# Patient Record
Sex: Female | Born: 1975 | Race: Black or African American | Hispanic: No | Marital: Single | Smoking: Never smoker
Health system: Southern US, Community
[De-identification: ages and names within clinical notes are randomized; demographics above are authoritative.]

## PROBLEM LIST (undated history)

## (undated) DIAGNOSIS — I700 Atherosclerosis of aorta: Secondary | ICD-10-CM

## (undated) DIAGNOSIS — E559 Vitamin D deficiency, unspecified: Secondary | ICD-10-CM

## (undated) DIAGNOSIS — D509 Iron deficiency anemia, unspecified: Secondary | ICD-10-CM

## (undated) DIAGNOSIS — D649 Anemia, unspecified: Secondary | ICD-10-CM

## (undated) DIAGNOSIS — F79 Unspecified intellectual disabilities: Secondary | ICD-10-CM

## (undated) DIAGNOSIS — R112 Nausea with vomiting, unspecified: Secondary | ICD-10-CM

## (undated) DIAGNOSIS — J301 Allergic rhinitis due to pollen: Secondary | ICD-10-CM

## (undated) DIAGNOSIS — K219 Gastro-esophageal reflux disease without esophagitis: Secondary | ICD-10-CM

## (undated) DIAGNOSIS — F250 Schizoaffective disorder, bipolar type: Secondary | ICD-10-CM

## (undated) DIAGNOSIS — R569 Unspecified convulsions: Secondary | ICD-10-CM

## (undated) DIAGNOSIS — R059 Cough, unspecified: Secondary | ICD-10-CM

## (undated) DIAGNOSIS — Z0000 Encounter for general adult medical examination without abnormal findings: Secondary | ICD-10-CM

## (undated) DIAGNOSIS — M545 Low back pain: Secondary | ICD-10-CM

## (undated) DIAGNOSIS — D84821 Immunodeficiency due to drugs: Secondary | ICD-10-CM

## (undated) DIAGNOSIS — Z944 Liver transplant status: Secondary | ICD-10-CM

## (undated) DIAGNOSIS — J4540 Moderate persistent asthma, uncomplicated: Secondary | ICD-10-CM

## (undated) DIAGNOSIS — M1990 Unspecified osteoarthritis, unspecified site: Secondary | ICD-10-CM

## (undated) DIAGNOSIS — G40909 Epilepsy, unspecified, not intractable, without status epilepticus: Secondary | ICD-10-CM

## (undated) DIAGNOSIS — N183 Chronic kidney disease, stage 3 unspecified: Secondary | ICD-10-CM

## (undated) DIAGNOSIS — K5909 Other constipation: Secondary | ICD-10-CM

## (undated) DIAGNOSIS — G4733 Obstructive sleep apnea (adult) (pediatric): Secondary | ICD-10-CM

## (undated) DIAGNOSIS — Z79899 Other long term (current) drug therapy: Secondary | ICD-10-CM

## (undated) DIAGNOSIS — I351 Nonrheumatic aortic (valve) insufficiency: Secondary | ICD-10-CM

## (undated) DIAGNOSIS — I2510 Atherosclerotic heart disease of native coronary artery without angina pectoris: Secondary | ICD-10-CM

## (undated) DIAGNOSIS — D573 Sickle-cell trait: Secondary | ICD-10-CM

## (undated) DIAGNOSIS — M797 Fibromyalgia: Secondary | ICD-10-CM

## (undated) DIAGNOSIS — N939 Abnormal uterine and vaginal bleeding, unspecified: Secondary | ICD-10-CM

## (undated) DIAGNOSIS — Z8711 Personal history of peptic ulcer disease: Secondary | ICD-10-CM

## (undated) DIAGNOSIS — L989 Disorder of the skin and subcutaneous tissue, unspecified: Secondary | ICD-10-CM

## (undated) DIAGNOSIS — N950 Postmenopausal bleeding: Secondary | ICD-10-CM

## (undated) DIAGNOSIS — T7840XA Allergy, unspecified, initial encounter: Secondary | ICD-10-CM

## (undated) DIAGNOSIS — K743 Primary biliary cirrhosis: Secondary | ICD-10-CM

## (undated) DIAGNOSIS — Z9889 Other specified postprocedural states: Secondary | ICD-10-CM

## (undated) DIAGNOSIS — J3089 Other allergic rhinitis: Secondary | ICD-10-CM

## (undated) DIAGNOSIS — N938 Other specified abnormal uterine and vaginal bleeding: Secondary | ICD-10-CM

## (undated) DIAGNOSIS — W19XXXA Unspecified fall, initial encounter: Secondary | ICD-10-CM

## (undated) DIAGNOSIS — K745 Biliary cirrhosis, unspecified: Secondary | ICD-10-CM

## (undated) HISTORY — PX: APPENDECTOMY: SHX54

## (undated) HISTORY — PX: LIVER TRANSPLANT: SHX410

## (undated) HISTORY — PX: EYE MUSCLE SURGERY: SHX370

## (undated) HISTORY — DX: Primary biliary cirrhosis: K74.3

## (undated) HISTORY — DX: Biliary cirrhosis, unspecified: K74.5

## (undated) HISTORY — DX: Liver transplant status: Z94.4

## (undated) HISTORY — PX: TYMPANOSTOMY TUBE PLACEMENT: SHX32

## (undated) HISTORY — DX: Atherosclerotic heart disease of native coronary artery without angina pectoris: I25.10

## (undated) HISTORY — DX: Atherosclerosis of aorta: I70.0

## (undated) HISTORY — DX: Nonrheumatic aortic (valve) insufficiency: I35.1

## (undated) HISTORY — DX: Allergy, unspecified, initial encounter: T78.40XA

## (undated) HISTORY — DX: Anemia, unspecified: D64.9

## (undated) HISTORY — PX: OTHER SURGICAL HISTORY: SHX169

## (undated) HISTORY — PX: ADENOIDECTOMY: SUR15

## (undated) HISTORY — DX: Unspecified convulsions: R56.9

## (undated) HISTORY — DX: Unspecified intellectual disabilities: F79

## (undated) HISTORY — PX: STRABISMUS SURGERY: SHX218

## (undated) HISTORY — PX: COLONOSCOPY W/ BIOPSIES: SHX1374

## (undated) HISTORY — PX: CHOLECYSTECTOMY: SHX55

## (undated) HISTORY — DX: Abnormal uterine and vaginal bleeding, unspecified: N93.9

## (undated) HISTORY — DX: Other specified abnormal uterine and vaginal bleeding: N93.8

---
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
## 1997-11-22 ENCOUNTER — Ambulatory Visit (HOSPITAL_COMMUNITY): Admission: RE | Admit: 1997-11-22 | Discharge: 1997-11-22 | Payer: Self-pay | Admitting: Pediatrics

## 1997-12-13 ENCOUNTER — Encounter: Admission: RE | Admit: 1997-12-13 | Discharge: 1997-12-13 | Payer: Self-pay | Admitting: Family Medicine

## 1998-01-19 ENCOUNTER — Encounter: Admission: RE | Admit: 1998-01-19 | Discharge: 1998-01-19 | Payer: Self-pay | Admitting: Family Medicine

## 1998-02-14 ENCOUNTER — Encounter: Admission: RE | Admit: 1998-02-14 | Discharge: 1998-02-14 | Payer: Self-pay | Admitting: Family Medicine

## 1998-03-15 ENCOUNTER — Encounter: Admission: RE | Admit: 1998-03-15 | Discharge: 1998-03-15 | Payer: Self-pay | Admitting: Sports Medicine

## 1998-03-25 ENCOUNTER — Encounter: Admission: RE | Admit: 1998-03-25 | Discharge: 1998-03-25 | Payer: Self-pay | Admitting: Family Medicine

## 1998-03-28 ENCOUNTER — Encounter: Admission: RE | Admit: 1998-03-28 | Discharge: 1998-03-28 | Payer: Self-pay | Admitting: Family Medicine

## 1998-07-20 ENCOUNTER — Encounter: Admission: RE | Admit: 1998-07-20 | Discharge: 1998-07-20 | Payer: Self-pay | Admitting: Family Medicine

## 1998-08-25 ENCOUNTER — Encounter: Admission: RE | Admit: 1998-08-25 | Discharge: 1998-08-25 | Payer: Self-pay | Admitting: Family Medicine

## 1998-09-20 ENCOUNTER — Encounter: Admission: RE | Admit: 1998-09-20 | Discharge: 1998-09-20 | Payer: Self-pay | Admitting: Sports Medicine

## 1998-10-26 ENCOUNTER — Encounter: Admission: RE | Admit: 1998-10-26 | Discharge: 1998-10-26 | Payer: Self-pay | Admitting: Family Medicine

## 1998-11-28 ENCOUNTER — Encounter: Admission: RE | Admit: 1998-11-28 | Discharge: 1998-11-28 | Payer: Self-pay | Admitting: Family Medicine

## 1999-03-10 ENCOUNTER — Encounter: Admission: RE | Admit: 1999-03-10 | Discharge: 1999-03-10 | Payer: Self-pay | Admitting: Family Medicine

## 1999-06-29 ENCOUNTER — Encounter: Admission: RE | Admit: 1999-06-29 | Discharge: 1999-06-29 | Payer: Self-pay | Admitting: Family Medicine

## 1999-08-04 ENCOUNTER — Encounter: Admission: RE | Admit: 1999-08-04 | Discharge: 1999-08-04 | Payer: Self-pay | Admitting: Family Medicine

## 1999-09-15 ENCOUNTER — Encounter: Admission: RE | Admit: 1999-09-15 | Discharge: 1999-09-15 | Payer: Self-pay | Admitting: Family Medicine

## 1999-09-28 ENCOUNTER — Encounter: Admission: RE | Admit: 1999-09-28 | Discharge: 1999-09-28 | Payer: Self-pay | Admitting: Family Medicine

## 1999-10-31 ENCOUNTER — Encounter: Admission: RE | Admit: 1999-10-31 | Discharge: 1999-10-31 | Payer: Self-pay | Admitting: Family Medicine

## 1999-11-10 ENCOUNTER — Encounter: Admission: RE | Admit: 1999-11-10 | Discharge: 1999-11-10 | Payer: Self-pay | Admitting: Family Medicine

## 1999-12-12 ENCOUNTER — Encounter: Admission: RE | Admit: 1999-12-12 | Discharge: 1999-12-12 | Payer: Self-pay | Admitting: Sports Medicine

## 2000-01-03 ENCOUNTER — Encounter: Admission: RE | Admit: 2000-01-03 | Discharge: 2000-01-03 | Payer: Self-pay | Admitting: Family Medicine

## 2000-05-13 ENCOUNTER — Encounter: Admission: RE | Admit: 2000-05-13 | Discharge: 2000-05-13 | Payer: Self-pay | Admitting: Family Medicine

## 2000-07-04 ENCOUNTER — Encounter: Admission: RE | Admit: 2000-07-04 | Discharge: 2000-07-04 | Payer: Self-pay

## 2000-07-17 ENCOUNTER — Encounter: Admission: RE | Admit: 2000-07-17 | Discharge: 2000-07-17 | Payer: Self-pay | Admitting: Family Medicine

## 2000-07-17 ENCOUNTER — Other Ambulatory Visit: Admission: RE | Admit: 2000-07-17 | Discharge: 2000-07-17 | Payer: Self-pay | Admitting: Family Medicine

## 2000-08-20 ENCOUNTER — Encounter: Admission: RE | Admit: 2000-08-20 | Discharge: 2000-08-20 | Payer: Self-pay | Admitting: Family Medicine

## 2000-09-06 ENCOUNTER — Encounter: Admission: RE | Admit: 2000-09-06 | Discharge: 2000-09-06 | Payer: Self-pay | Admitting: Family Medicine

## 2000-09-12 ENCOUNTER — Encounter: Admission: RE | Admit: 2000-09-12 | Discharge: 2000-09-12 | Payer: Self-pay | Admitting: Family Medicine

## 2000-09-27 ENCOUNTER — Encounter: Admission: RE | Admit: 2000-09-27 | Discharge: 2000-09-27 | Payer: Self-pay | Admitting: Family Medicine

## 2000-10-04 ENCOUNTER — Encounter: Admission: RE | Admit: 2000-10-04 | Discharge: 2000-10-04 | Payer: Self-pay | Admitting: Family Medicine

## 2000-11-11 ENCOUNTER — Encounter: Admission: RE | Admit: 2000-11-11 | Discharge: 2000-11-11 | Payer: Self-pay | Admitting: Family Medicine

## 2000-11-19 ENCOUNTER — Encounter: Admission: RE | Admit: 2000-11-19 | Discharge: 2000-11-19 | Payer: Self-pay | Admitting: Family Medicine

## 2000-11-27 ENCOUNTER — Encounter: Admission: RE | Admit: 2000-11-27 | Discharge: 2000-11-27 | Payer: Self-pay | Admitting: Family Medicine

## 2000-11-27 ENCOUNTER — Ambulatory Visit (HOSPITAL_COMMUNITY): Admission: RE | Admit: 2000-11-27 | Discharge: 2000-11-27 | Payer: Self-pay | Admitting: Family Medicine

## 2000-11-29 ENCOUNTER — Encounter: Admission: RE | Admit: 2000-11-29 | Discharge: 2000-11-29 | Payer: Self-pay | Admitting: Family Medicine

## 2000-12-13 ENCOUNTER — Encounter: Admission: RE | Admit: 2000-12-13 | Discharge: 2000-12-13 | Payer: Self-pay | Admitting: Family Medicine

## 2000-12-30 ENCOUNTER — Encounter: Admission: RE | Admit: 2000-12-30 | Discharge: 2000-12-30 | Payer: Self-pay | Admitting: Sports Medicine

## 2001-01-28 ENCOUNTER — Encounter: Admission: RE | Admit: 2001-01-28 | Discharge: 2001-01-28 | Payer: Self-pay | Admitting: Family Medicine

## 2001-02-26 ENCOUNTER — Encounter: Admission: RE | Admit: 2001-02-26 | Discharge: 2001-02-26 | Payer: Self-pay | Admitting: Family Medicine

## 2001-03-14 ENCOUNTER — Encounter: Admission: RE | Admit: 2001-03-14 | Discharge: 2001-03-14 | Payer: Self-pay | Admitting: Family Medicine

## 2001-04-22 ENCOUNTER — Encounter: Admission: RE | Admit: 2001-04-22 | Discharge: 2001-04-22 | Payer: Self-pay | Admitting: Family Medicine

## 2001-04-28 ENCOUNTER — Encounter: Admission: RE | Admit: 2001-04-28 | Discharge: 2001-04-28 | Payer: Self-pay | Admitting: Family Medicine

## 2001-05-14 ENCOUNTER — Encounter: Admission: RE | Admit: 2001-05-14 | Discharge: 2001-05-14 | Payer: Self-pay | Admitting: Family Medicine

## 2001-06-09 ENCOUNTER — Encounter: Admission: RE | Admit: 2001-06-09 | Discharge: 2001-06-09 | Payer: Self-pay | Admitting: Family Medicine

## 2001-06-09 ENCOUNTER — Encounter: Payer: Self-pay | Admitting: Family Medicine

## 2001-06-18 ENCOUNTER — Encounter: Admission: RE | Admit: 2001-06-18 | Discharge: 2001-06-18 | Payer: Self-pay | Admitting: Family Medicine

## 2001-07-09 ENCOUNTER — Encounter: Admission: RE | Admit: 2001-07-09 | Discharge: 2001-07-09 | Payer: Self-pay | Admitting: Family Medicine

## 2001-08-06 ENCOUNTER — Encounter: Admission: RE | Admit: 2001-08-06 | Discharge: 2001-08-06 | Payer: Self-pay | Admitting: Family Medicine

## 2001-08-20 ENCOUNTER — Ambulatory Visit (HOSPITAL_COMMUNITY): Admission: RE | Admit: 2001-08-20 | Discharge: 2001-08-20 | Payer: Self-pay | Admitting: Family Medicine

## 2001-09-10 ENCOUNTER — Other Ambulatory Visit: Admission: RE | Admit: 2001-09-10 | Discharge: 2001-09-10 | Payer: Self-pay | Admitting: Family Medicine

## 2001-09-10 ENCOUNTER — Encounter: Admission: RE | Admit: 2001-09-10 | Discharge: 2001-09-10 | Payer: Self-pay | Admitting: Family Medicine

## 2001-09-18 ENCOUNTER — Encounter: Admission: RE | Admit: 2001-09-18 | Discharge: 2001-09-18 | Payer: Self-pay | Admitting: Sports Medicine

## 2001-09-18 ENCOUNTER — Encounter: Payer: Self-pay | Admitting: Sports Medicine

## 2001-09-24 ENCOUNTER — Encounter: Admission: RE | Admit: 2001-09-24 | Discharge: 2001-09-24 | Payer: Self-pay | Admitting: Family Medicine

## 2001-10-31 ENCOUNTER — Encounter: Admission: RE | Admit: 2001-10-31 | Discharge: 2001-10-31 | Payer: Self-pay | Admitting: Family Medicine

## 2001-12-10 ENCOUNTER — Encounter: Admission: RE | Admit: 2001-12-10 | Discharge: 2001-12-10 | Payer: Self-pay | Admitting: Family Medicine

## 2002-01-29 ENCOUNTER — Encounter: Admission: RE | Admit: 2002-01-29 | Discharge: 2002-01-29 | Payer: Self-pay | Admitting: Family Medicine

## 2002-03-12 ENCOUNTER — Encounter: Admission: RE | Admit: 2002-03-12 | Discharge: 2002-03-12 | Payer: Self-pay | Admitting: Family Medicine

## 2002-03-30 ENCOUNTER — Encounter: Admission: RE | Admit: 2002-03-30 | Discharge: 2002-03-30 | Payer: Self-pay | Admitting: Family Medicine

## 2002-04-10 ENCOUNTER — Encounter: Admission: RE | Admit: 2002-04-10 | Discharge: 2002-04-10 | Payer: Self-pay | Admitting: Sports Medicine

## 2002-04-10 ENCOUNTER — Encounter: Payer: Self-pay | Admitting: Sports Medicine

## 2002-04-14 ENCOUNTER — Encounter: Admission: RE | Admit: 2002-04-14 | Discharge: 2002-04-14 | Payer: Self-pay | Admitting: Family Medicine

## 2002-05-15 ENCOUNTER — Encounter: Admission: RE | Admit: 2002-05-15 | Discharge: 2002-05-15 | Payer: Self-pay | Admitting: Family Medicine

## 2002-06-30 ENCOUNTER — Encounter: Admission: RE | Admit: 2002-06-30 | Discharge: 2002-06-30 | Payer: Self-pay | Admitting: Family Medicine

## 2002-08-12 ENCOUNTER — Encounter: Admission: RE | Admit: 2002-08-12 | Discharge: 2002-08-12 | Payer: Self-pay | Admitting: Family Medicine

## 2002-08-31 ENCOUNTER — Encounter: Admission: RE | Admit: 2002-08-31 | Discharge: 2002-08-31 | Payer: Self-pay | Admitting: Family Medicine

## 2002-09-17 ENCOUNTER — Encounter: Admission: RE | Admit: 2002-09-17 | Discharge: 2002-09-17 | Payer: Self-pay | Admitting: Family Medicine

## 2002-09-17 ENCOUNTER — Ambulatory Visit (HOSPITAL_COMMUNITY): Admission: RE | Admit: 2002-09-17 | Discharge: 2002-09-17 | Payer: Self-pay | Admitting: Specialist

## 2002-10-02 ENCOUNTER — Encounter: Admission: RE | Admit: 2002-10-02 | Discharge: 2002-10-02 | Payer: Self-pay | Admitting: Family Medicine

## 2002-10-02 ENCOUNTER — Ambulatory Visit (HOSPITAL_COMMUNITY): Admission: RE | Admit: 2002-10-02 | Discharge: 2002-10-02 | Payer: Self-pay | Admitting: Family Medicine

## 2002-10-28 ENCOUNTER — Encounter: Admission: RE | Admit: 2002-10-28 | Discharge: 2002-10-28 | Payer: Self-pay | Admitting: Family Medicine

## 2002-11-18 ENCOUNTER — Encounter: Admission: RE | Admit: 2002-11-18 | Discharge: 2002-11-18 | Payer: Self-pay | Admitting: Family Medicine

## 2003-01-01 ENCOUNTER — Encounter: Admission: RE | Admit: 2003-01-01 | Discharge: 2003-01-01 | Payer: Self-pay | Admitting: Family Medicine

## 2003-01-26 ENCOUNTER — Encounter: Admission: RE | Admit: 2003-01-26 | Discharge: 2003-01-26 | Payer: Self-pay | Admitting: Family Medicine

## 2003-03-22 ENCOUNTER — Encounter: Admission: RE | Admit: 2003-03-22 | Discharge: 2003-03-22 | Payer: Self-pay | Admitting: Family Medicine

## 2003-05-03 ENCOUNTER — Encounter: Admission: RE | Admit: 2003-05-03 | Discharge: 2003-05-03 | Payer: Self-pay | Admitting: Family Medicine

## 2003-06-07 ENCOUNTER — Encounter (INDEPENDENT_AMBULATORY_CARE_PROVIDER_SITE_OTHER): Payer: Self-pay | Admitting: Specialist

## 2003-06-07 ENCOUNTER — Other Ambulatory Visit: Admission: RE | Admit: 2003-06-07 | Discharge: 2003-06-07 | Payer: Self-pay | Admitting: Family Medicine

## 2003-06-07 ENCOUNTER — Encounter: Admission: RE | Admit: 2003-06-07 | Discharge: 2003-06-07 | Payer: Self-pay | Admitting: Family Medicine

## 2003-06-30 ENCOUNTER — Encounter: Admission: RE | Admit: 2003-06-30 | Discharge: 2003-06-30 | Payer: Self-pay | Admitting: Family Medicine

## 2003-07-08 ENCOUNTER — Encounter: Admission: RE | Admit: 2003-07-08 | Discharge: 2003-07-08 | Payer: Self-pay | Admitting: Sports Medicine

## 2003-09-30 ENCOUNTER — Encounter: Admission: RE | Admit: 2003-09-30 | Discharge: 2003-09-30 | Payer: Self-pay | Admitting: Family Medicine

## 2003-10-20 ENCOUNTER — Encounter: Admission: RE | Admit: 2003-10-20 | Discharge: 2003-10-20 | Payer: Self-pay | Admitting: Family Medicine

## 2003-11-02 ENCOUNTER — Encounter: Admission: RE | Admit: 2003-11-02 | Discharge: 2003-11-02 | Payer: Self-pay | Admitting: Sports Medicine

## 2003-11-22 ENCOUNTER — Encounter: Admission: RE | Admit: 2003-11-22 | Discharge: 2003-11-22 | Payer: Self-pay | Admitting: Sports Medicine

## 2004-01-28 ENCOUNTER — Ambulatory Visit: Payer: Self-pay | Admitting: Family Medicine

## 2004-03-10 ENCOUNTER — Ambulatory Visit: Payer: Self-pay | Admitting: Family Medicine

## 2004-03-31 ENCOUNTER — Ambulatory Visit: Payer: Self-pay | Admitting: Family Medicine

## 2004-05-02 ENCOUNTER — Ambulatory Visit: Payer: Self-pay | Admitting: Family Medicine

## 2004-05-19 ENCOUNTER — Ambulatory Visit: Payer: Self-pay | Admitting: Sports Medicine

## 2004-06-13 ENCOUNTER — Ambulatory Visit: Payer: Self-pay | Admitting: Family Medicine

## 2004-06-20 ENCOUNTER — Ambulatory Visit: Payer: Self-pay | Admitting: Family Medicine

## 2004-07-07 ENCOUNTER — Ambulatory Visit: Payer: Self-pay | Admitting: Sports Medicine

## 2004-07-27 ENCOUNTER — Encounter: Admission: RE | Admit: 2004-07-27 | Discharge: 2004-07-27 | Payer: Self-pay | Admitting: Family Medicine

## 2004-07-27 ENCOUNTER — Ambulatory Visit: Payer: Self-pay | Admitting: Family Medicine

## 2004-08-10 ENCOUNTER — Ambulatory Visit: Payer: Self-pay | Admitting: Family Medicine

## 2004-08-10 ENCOUNTER — Encounter: Admission: RE | Admit: 2004-08-10 | Discharge: 2004-08-10 | Payer: Self-pay | Admitting: Sports Medicine

## 2004-08-17 ENCOUNTER — Encounter: Admission: RE | Admit: 2004-08-17 | Discharge: 2004-09-01 | Payer: Self-pay | Admitting: Family Medicine

## 2004-09-12 ENCOUNTER — Ambulatory Visit: Payer: Self-pay | Admitting: Family Medicine

## 2004-09-20 ENCOUNTER — Ambulatory Visit: Payer: Self-pay | Admitting: Family Medicine

## 2004-10-02 ENCOUNTER — Ambulatory Visit: Payer: Self-pay | Admitting: Sports Medicine

## 2004-10-18 ENCOUNTER — Ambulatory Visit: Payer: Self-pay | Admitting: Family Medicine

## 2004-12-07 ENCOUNTER — Ambulatory Visit: Payer: Self-pay | Admitting: Family Medicine

## 2004-12-19 ENCOUNTER — Ambulatory Visit: Payer: Self-pay | Admitting: Family Medicine

## 2005-01-08 ENCOUNTER — Ambulatory Visit: Payer: Self-pay | Admitting: Family Medicine

## 2005-02-22 ENCOUNTER — Ambulatory Visit: Payer: Self-pay | Admitting: Family Medicine

## 2005-03-07 ENCOUNTER — Ambulatory Visit: Payer: Self-pay | Admitting: Family Medicine

## 2005-04-02 ENCOUNTER — Ambulatory Visit: Payer: Self-pay | Admitting: Family Medicine

## 2005-04-12 ENCOUNTER — Ambulatory Visit: Payer: Self-pay | Admitting: Sports Medicine

## 2005-05-15 ENCOUNTER — Ambulatory Visit: Payer: Self-pay | Admitting: Sports Medicine

## 2005-05-25 ENCOUNTER — Ambulatory Visit: Payer: Self-pay | Admitting: Sports Medicine

## 2005-06-01 ENCOUNTER — Ambulatory Visit: Payer: Self-pay | Admitting: Family Medicine

## 2005-06-04 ENCOUNTER — Encounter: Admission: RE | Admit: 2005-06-04 | Discharge: 2005-06-04 | Payer: Self-pay | Admitting: Sports Medicine

## 2005-06-08 ENCOUNTER — Ambulatory Visit: Payer: Self-pay | Admitting: Sports Medicine

## 2005-06-20 ENCOUNTER — Emergency Department (HOSPITAL_COMMUNITY): Admission: EM | Admit: 2005-06-20 | Discharge: 2005-06-20 | Payer: Self-pay | Admitting: Emergency Medicine

## 2005-06-25 ENCOUNTER — Ambulatory Visit: Payer: Self-pay | Admitting: Sports Medicine

## 2005-06-27 ENCOUNTER — Ambulatory Visit: Payer: Self-pay | Admitting: Family Medicine

## 2005-07-10 ENCOUNTER — Ambulatory Visit: Payer: Self-pay | Admitting: Sports Medicine

## 2005-07-18 ENCOUNTER — Ambulatory Visit: Payer: Self-pay | Admitting: Sports Medicine

## 2005-08-15 ENCOUNTER — Ambulatory Visit: Payer: Self-pay | Admitting: Family Medicine

## 2005-08-17 ENCOUNTER — Ambulatory Visit: Payer: Self-pay | Admitting: Family Medicine

## 2005-08-24 ENCOUNTER — Ambulatory Visit: Payer: Self-pay | Admitting: Family Medicine

## 2005-09-25 ENCOUNTER — Ambulatory Visit: Payer: Self-pay | Admitting: Family Medicine

## 2005-10-05 ENCOUNTER — Ambulatory Visit: Payer: Self-pay | Admitting: Family Medicine

## 2005-10-24 ENCOUNTER — Ambulatory Visit: Payer: Self-pay | Admitting: Family Medicine

## 2005-11-07 ENCOUNTER — Ambulatory Visit: Payer: Self-pay | Admitting: Family Medicine

## 2005-11-14 ENCOUNTER — Ambulatory Visit: Payer: Self-pay | Admitting: Family Medicine

## 2005-11-20 ENCOUNTER — Ambulatory Visit: Payer: Self-pay | Admitting: Family Medicine

## 2005-11-25 ENCOUNTER — Encounter (INDEPENDENT_AMBULATORY_CARE_PROVIDER_SITE_OTHER): Payer: Self-pay | Admitting: *Deleted

## 2005-12-07 ENCOUNTER — Ambulatory Visit: Payer: Self-pay | Admitting: Family Medicine

## 2005-12-13 ENCOUNTER — Ambulatory Visit: Payer: Self-pay | Admitting: Family Medicine

## 2005-12-14 ENCOUNTER — Other Ambulatory Visit: Admission: RE | Admit: 2005-12-14 | Discharge: 2005-12-14 | Payer: Self-pay | Admitting: Family Medicine

## 2005-12-14 ENCOUNTER — Ambulatory Visit: Payer: Self-pay | Admitting: Family Medicine

## 2006-01-14 ENCOUNTER — Ambulatory Visit: Payer: Self-pay | Admitting: Family Medicine

## 2006-02-07 ENCOUNTER — Ambulatory Visit: Payer: Self-pay | Admitting: Family Medicine

## 2006-02-20 ENCOUNTER — Ambulatory Visit: Payer: Self-pay | Admitting: Family Medicine

## 2006-02-22 ENCOUNTER — Ambulatory Visit (HOSPITAL_COMMUNITY): Admission: RE | Admit: 2006-02-22 | Discharge: 2006-02-22 | Payer: Self-pay | Admitting: Family Medicine

## 2006-03-04 ENCOUNTER — Ambulatory Visit: Payer: Self-pay | Admitting: Family Medicine

## 2006-03-18 ENCOUNTER — Ambulatory Visit: Payer: Self-pay | Admitting: Sports Medicine

## 2006-04-22 ENCOUNTER — Ambulatory Visit: Payer: Self-pay | Admitting: Sports Medicine

## 2006-05-15 ENCOUNTER — Ambulatory Visit: Payer: Self-pay | Admitting: Family Medicine

## 2006-05-31 ENCOUNTER — Ambulatory Visit: Payer: Self-pay | Admitting: Family Medicine

## 2006-06-05 ENCOUNTER — Ambulatory Visit: Payer: Self-pay | Admitting: Family Medicine

## 2006-06-11 ENCOUNTER — Ambulatory Visit: Payer: Self-pay | Admitting: Sports Medicine

## 2006-06-12 ENCOUNTER — Encounter: Admission: RE | Admit: 2006-06-12 | Discharge: 2006-06-12 | Payer: Self-pay | Admitting: Sports Medicine

## 2006-07-12 ENCOUNTER — Ambulatory Visit: Payer: Self-pay | Admitting: Family Medicine

## 2006-07-12 ENCOUNTER — Ambulatory Visit (HOSPITAL_COMMUNITY): Admission: RE | Admit: 2006-07-12 | Discharge: 2006-07-12 | Payer: Self-pay | Admitting: Family Medicine

## 2006-07-12 ENCOUNTER — Encounter (INDEPENDENT_AMBULATORY_CARE_PROVIDER_SITE_OTHER): Payer: Self-pay | Admitting: Family Medicine

## 2006-07-12 LAB — CONVERTED CEMR LAB
AST: 54 units/L — ABNORMAL HIGH (ref 0–37)
Albumin: 3.9 g/dL (ref 3.5–5.2)
Alkaline Phosphatase: 490 units/L — ABNORMAL HIGH (ref 39–117)
Calcium: 9.2 mg/dL (ref 8.4–10.5)
Chloride: 101 meq/L (ref 96–112)
Potassium: 4.3 meq/L (ref 3.5–5.3)
Sodium: 138 meq/L (ref 135–145)
Total Protein: 8.6 g/dL — ABNORMAL HIGH (ref 6.0–8.3)

## 2006-07-25 DIAGNOSIS — F79 Unspecified intellectual disabilities: Secondary | ICD-10-CM | POA: Insufficient documentation

## 2006-07-25 DIAGNOSIS — F450 Somatization disorder: Secondary | ICD-10-CM | POA: Insufficient documentation

## 2006-07-25 DIAGNOSIS — M1710 Unilateral primary osteoarthritis, unspecified knee: Secondary | ICD-10-CM

## 2006-07-25 DIAGNOSIS — J45909 Unspecified asthma, uncomplicated: Secondary | ICD-10-CM | POA: Insufficient documentation

## 2006-07-25 DIAGNOSIS — IMO0002 Reserved for concepts with insufficient information to code with codable children: Secondary | ICD-10-CM | POA: Insufficient documentation

## 2006-07-25 HISTORY — DX: Morbid (severe) obesity due to excess calories: E66.01

## 2006-07-26 ENCOUNTER — Encounter (INDEPENDENT_AMBULATORY_CARE_PROVIDER_SITE_OTHER): Payer: Self-pay | Admitting: *Deleted

## 2006-07-30 ENCOUNTER — Ambulatory Visit: Payer: Self-pay | Admitting: Family Medicine

## 2006-07-30 DIAGNOSIS — J45909 Unspecified asthma, uncomplicated: Secondary | ICD-10-CM | POA: Insufficient documentation

## 2006-08-01 ENCOUNTER — Telehealth (INDEPENDENT_AMBULATORY_CARE_PROVIDER_SITE_OTHER): Payer: Self-pay | Admitting: Family Medicine

## 2006-08-13 ENCOUNTER — Encounter (INDEPENDENT_AMBULATORY_CARE_PROVIDER_SITE_OTHER): Payer: Self-pay | Admitting: Family Medicine

## 2006-08-13 ENCOUNTER — Ambulatory Visit: Payer: Self-pay | Admitting: Sports Medicine

## 2006-08-22 ENCOUNTER — Telehealth: Payer: Self-pay | Admitting: *Deleted

## 2006-08-23 ENCOUNTER — Ambulatory Visit: Payer: Self-pay | Admitting: Family Medicine

## 2006-08-23 DIAGNOSIS — R569 Unspecified convulsions: Secondary | ICD-10-CM

## 2006-08-27 ENCOUNTER — Telehealth: Payer: Self-pay | Admitting: *Deleted

## 2006-08-29 ENCOUNTER — Ambulatory Visit: Payer: Self-pay | Admitting: Sports Medicine

## 2006-09-13 ENCOUNTER — Telehealth: Payer: Self-pay | Admitting: *Deleted

## 2006-10-02 ENCOUNTER — Ambulatory Visit: Payer: Self-pay | Admitting: Family Medicine

## 2006-10-29 ENCOUNTER — Ambulatory Visit: Payer: Self-pay | Admitting: *Deleted

## 2006-10-29 ENCOUNTER — Encounter (INDEPENDENT_AMBULATORY_CARE_PROVIDER_SITE_OTHER): Payer: Self-pay | Admitting: Family Medicine

## 2006-10-29 LAB — CONVERTED CEMR LAB
Albumin: 4 g/dL (ref 3.5–5.2)
Alkaline Phosphatase: 683 units/L — ABNORMAL HIGH (ref 39–117)
BUN: 16 mg/dL (ref 6–23)
CO2: 23 meq/L (ref 19–32)
Calcium: 9.3 mg/dL (ref 8.4–10.5)
Chloride: 101 meq/L (ref 96–112)
Glucose, Bld: 91 mg/dL (ref 70–99)
Potassium: 4.6 meq/L (ref 3.5–5.3)
Sodium: 139 meq/L (ref 135–145)
Total Protein: 9.1 g/dL — ABNORMAL HIGH (ref 6.0–8.3)

## 2006-10-30 ENCOUNTER — Encounter (INDEPENDENT_AMBULATORY_CARE_PROVIDER_SITE_OTHER): Payer: Self-pay | Admitting: Family Medicine

## 2006-10-30 DIAGNOSIS — R945 Abnormal results of liver function studies: Secondary | ICD-10-CM | POA: Insufficient documentation

## 2006-10-31 ENCOUNTER — Encounter: Admission: RE | Admit: 2006-10-31 | Discharge: 2006-10-31 | Payer: Self-pay | Admitting: Sports Medicine

## 2006-11-15 ENCOUNTER — Telehealth: Payer: Self-pay | Admitting: *Deleted

## 2006-11-20 ENCOUNTER — Encounter (INDEPENDENT_AMBULATORY_CARE_PROVIDER_SITE_OTHER): Payer: Self-pay | Admitting: Family Medicine

## 2006-11-20 ENCOUNTER — Ambulatory Visit: Payer: Self-pay | Admitting: Family Medicine

## 2006-11-20 LAB — CONVERTED CEMR LAB
ALT: 93 units/L — ABNORMAL HIGH (ref 0–35)
AST: 99 units/L — ABNORMAL HIGH (ref 0–37)
Albumin: 4 g/dL (ref 3.5–5.2)
Bilirubin Urine: POSITIVE
GGT: 3942 units/L — ABNORMAL HIGH (ref 7–51)
Glucose, Urine, Semiquant: NEGATIVE
Hepatitis B Surface Ag: NEGATIVE
Prothrombin Time: 13.1 s (ref 11.6–15.2)
Saturation Ratios: 32 % (ref 20–55)
Specific Gravity, Urine: 1.015
TIBC: 419 ug/dL (ref 250–470)
Total Bilirubin: 0.8 mg/dL (ref 0.3–1.2)
Urobilinogen, UA: 1

## 2006-11-22 ENCOUNTER — Encounter (INDEPENDENT_AMBULATORY_CARE_PROVIDER_SITE_OTHER): Payer: Self-pay | Admitting: Family Medicine

## 2006-11-25 ENCOUNTER — Encounter: Admission: RE | Admit: 2006-11-25 | Discharge: 2006-11-25 | Payer: Self-pay | Admitting: Sports Medicine

## 2006-11-27 ENCOUNTER — Telehealth: Payer: Self-pay | Admitting: *Deleted

## 2006-11-28 ENCOUNTER — Ambulatory Visit: Payer: Self-pay | Admitting: Family Medicine

## 2006-12-02 ENCOUNTER — Telehealth: Payer: Self-pay | Admitting: *Deleted

## 2006-12-03 ENCOUNTER — Ambulatory Visit: Payer: Self-pay | Admitting: Family Medicine

## 2006-12-10 ENCOUNTER — Telehealth: Payer: Self-pay | Admitting: *Deleted

## 2006-12-12 ENCOUNTER — Ambulatory Visit: Payer: Self-pay | Admitting: Family Medicine

## 2006-12-12 ENCOUNTER — Telehealth (INDEPENDENT_AMBULATORY_CARE_PROVIDER_SITE_OTHER): Payer: Self-pay | Admitting: Family Medicine

## 2006-12-12 LAB — CONVERTED CEMR LAB: Glucose, Urine, Semiquant: NEGATIVE

## 2007-01-01 ENCOUNTER — Ambulatory Visit: Payer: Self-pay | Admitting: Family Medicine

## 2007-01-01 ENCOUNTER — Encounter (INDEPENDENT_AMBULATORY_CARE_PROVIDER_SITE_OTHER): Payer: Self-pay | Admitting: Family Medicine

## 2007-01-01 LAB — CONVERTED CEMR LAB
ALT: 127 units/L — ABNORMAL HIGH (ref 0–35)
AST: 111 units/L — ABNORMAL HIGH (ref 0–37)
Albumin: 3.9 g/dL (ref 3.5–5.2)

## 2007-01-02 ENCOUNTER — Encounter (INDEPENDENT_AMBULATORY_CARE_PROVIDER_SITE_OTHER): Payer: Self-pay | Admitting: Family Medicine

## 2007-01-06 ENCOUNTER — Telehealth: Payer: Self-pay | Admitting: *Deleted

## 2007-01-07 ENCOUNTER — Encounter: Admission: RE | Admit: 2007-01-07 | Discharge: 2007-01-07 | Payer: Self-pay | Admitting: Sports Medicine

## 2007-01-20 ENCOUNTER — Telehealth: Payer: Self-pay | Admitting: *Deleted

## 2007-01-24 ENCOUNTER — Ambulatory Visit: Payer: Self-pay | Admitting: Family Medicine

## 2007-01-24 ENCOUNTER — Encounter (INDEPENDENT_AMBULATORY_CARE_PROVIDER_SITE_OTHER): Payer: Self-pay | Admitting: Family Medicine

## 2007-01-24 LAB — CONVERTED CEMR LAB
ALT: 140 units/L — ABNORMAL HIGH (ref 0–35)
CO2: 24 meq/L (ref 19–32)
Chloride: 103 meq/L (ref 96–112)
Platelets: 241 10*3/uL (ref 150–400)
Sodium: 140 meq/L (ref 135–145)
Total Bilirubin: 1 mg/dL (ref 0.3–1.2)
Total Protein: 8.5 g/dL — ABNORMAL HIGH (ref 6.0–8.3)
WBC: 4.3 10*3/uL (ref 4.0–10.5)

## 2007-01-29 ENCOUNTER — Telehealth: Payer: Self-pay | Admitting: *Deleted

## 2007-01-30 ENCOUNTER — Telehealth: Payer: Self-pay | Admitting: *Deleted

## 2007-01-31 ENCOUNTER — Ambulatory Visit: Payer: Self-pay | Admitting: Family Medicine

## 2007-02-03 ENCOUNTER — Encounter: Admission: RE | Admit: 2007-02-03 | Discharge: 2007-02-03 | Payer: Self-pay | Admitting: Sports Medicine

## 2007-02-03 ENCOUNTER — Telehealth (INDEPENDENT_AMBULATORY_CARE_PROVIDER_SITE_OTHER): Payer: Self-pay | Admitting: *Deleted

## 2007-02-03 ENCOUNTER — Encounter (INDEPENDENT_AMBULATORY_CARE_PROVIDER_SITE_OTHER): Payer: Self-pay | Admitting: Family Medicine

## 2007-02-10 ENCOUNTER — Telehealth (INDEPENDENT_AMBULATORY_CARE_PROVIDER_SITE_OTHER): Payer: Self-pay | Admitting: Family Medicine

## 2007-02-12 ENCOUNTER — Ambulatory Visit: Payer: Self-pay | Admitting: Family Medicine

## 2007-02-17 ENCOUNTER — Telehealth: Payer: Self-pay | Admitting: *Deleted

## 2007-02-18 ENCOUNTER — Telehealth: Payer: Self-pay | Admitting: *Deleted

## 2007-02-19 ENCOUNTER — Ambulatory Visit: Payer: Self-pay | Admitting: Family Medicine

## 2007-02-20 ENCOUNTER — Ambulatory Visit: Payer: Self-pay | Admitting: Family Medicine

## 2007-02-25 ENCOUNTER — Telehealth: Payer: Self-pay | Admitting: *Deleted

## 2007-03-19 ENCOUNTER — Telehealth (INDEPENDENT_AMBULATORY_CARE_PROVIDER_SITE_OTHER): Payer: Self-pay | Admitting: Family Medicine

## 2007-03-20 ENCOUNTER — Telehealth (INDEPENDENT_AMBULATORY_CARE_PROVIDER_SITE_OTHER): Payer: Self-pay | Admitting: *Deleted

## 2007-03-23 ENCOUNTER — Telehealth (INDEPENDENT_AMBULATORY_CARE_PROVIDER_SITE_OTHER): Payer: Self-pay | Admitting: Family Medicine

## 2007-03-24 ENCOUNTER — Telehealth (INDEPENDENT_AMBULATORY_CARE_PROVIDER_SITE_OTHER): Payer: Self-pay | Admitting: *Deleted

## 2007-03-26 ENCOUNTER — Ambulatory Visit: Payer: Self-pay | Admitting: Family Medicine

## 2007-03-28 ENCOUNTER — Telehealth: Payer: Self-pay | Admitting: *Deleted

## 2007-04-07 ENCOUNTER — Telehealth: Payer: Self-pay | Admitting: *Deleted

## 2007-04-11 ENCOUNTER — Ambulatory Visit: Payer: Self-pay | Admitting: Family Medicine

## 2007-04-28 ENCOUNTER — Telehealth: Payer: Self-pay | Admitting: *Deleted

## 2007-04-30 ENCOUNTER — Ambulatory Visit: Payer: Self-pay | Admitting: Family Medicine

## 2007-05-07 ENCOUNTER — Ambulatory Visit: Payer: Self-pay | Admitting: Family Medicine

## 2007-05-19 ENCOUNTER — Telehealth: Payer: Self-pay | Admitting: *Deleted

## 2007-05-23 ENCOUNTER — Telehealth: Payer: Self-pay | Admitting: *Deleted

## 2007-06-04 ENCOUNTER — Encounter (INDEPENDENT_AMBULATORY_CARE_PROVIDER_SITE_OTHER): Payer: Self-pay | Admitting: Family Medicine

## 2007-06-04 ENCOUNTER — Ambulatory Visit: Payer: Self-pay | Admitting: Family Medicine

## 2007-06-25 ENCOUNTER — Telehealth (INDEPENDENT_AMBULATORY_CARE_PROVIDER_SITE_OTHER): Payer: Self-pay | Admitting: Family Medicine

## 2007-06-27 ENCOUNTER — Ambulatory Visit: Payer: Self-pay | Admitting: Family Medicine

## 2007-07-08 ENCOUNTER — Encounter: Admission: RE | Admit: 2007-07-08 | Discharge: 2007-08-11 | Payer: Self-pay | Admitting: Family Medicine

## 2007-07-21 ENCOUNTER — Encounter (INDEPENDENT_AMBULATORY_CARE_PROVIDER_SITE_OTHER): Payer: Self-pay | Admitting: *Deleted

## 2007-07-24 ENCOUNTER — Ambulatory Visit: Payer: Self-pay | Admitting: Family Medicine

## 2007-08-08 ENCOUNTER — Telehealth (INDEPENDENT_AMBULATORY_CARE_PROVIDER_SITE_OTHER): Payer: Self-pay | Admitting: Family Medicine

## 2007-08-13 ENCOUNTER — Encounter (INDEPENDENT_AMBULATORY_CARE_PROVIDER_SITE_OTHER): Payer: Self-pay | Admitting: Family Medicine

## 2007-08-13 ENCOUNTER — Ambulatory Visit: Payer: Self-pay | Admitting: Family Medicine

## 2007-08-13 ENCOUNTER — Encounter: Payer: Self-pay | Admitting: *Deleted

## 2007-08-13 ENCOUNTER — Telehealth: Payer: Self-pay | Admitting: *Deleted

## 2007-08-14 ENCOUNTER — Encounter (INDEPENDENT_AMBULATORY_CARE_PROVIDER_SITE_OTHER): Payer: Self-pay | Admitting: Family Medicine

## 2007-08-25 LAB — CONVERTED CEMR LAB
ALT: 72 units/L — ABNORMAL HIGH (ref 0–35)
ANA Titer 1: 1:40 {titer} — ABNORMAL HIGH
AST: 66 units/L — ABNORMAL HIGH (ref 0–37)
Alkaline Phosphatase: 618 units/L — ABNORMAL HIGH (ref 39–117)
Anti Nuclear Antibody(ANA): POSITIVE — AB
Basophils Absolute: 0.1 10*3/uL (ref 0.0–0.1)
Basophils Relative: 2 % — ABNORMAL HIGH (ref 0–1)
CO2: 23 meq/L (ref 19–32)
Eosinophils Absolute: 0.5 10*3/uL (ref 0.0–0.7)
Lymphs Abs: 1.8 10*3/uL (ref 0.7–4.0)
MCV: 86 fL (ref 78.0–100.0)
Neutrophils Relative %: 34 % — ABNORMAL LOW (ref 43–77)
Platelets: 229 10*3/uL (ref 150–400)
RDW: 13.8 % (ref 11.5–15.5)
Rhuematoid fact SerPl-aCnc: 29 intl units/mL — ABNORMAL HIGH (ref 0–20)
Sed Rate: 54 mm/hr — ABNORMAL HIGH (ref 0–22)
Sodium: 137 meq/L (ref 135–145)
Total Bilirubin: 1.4 mg/dL — ABNORMAL HIGH (ref 0.3–1.2)
Total Protein: 8.9 g/dL — ABNORMAL HIGH (ref 6.0–8.3)
Vitamin B-12: 616 pg/mL (ref 211–911)
WBC: 4.4 10*3/uL (ref 4.0–10.5)

## 2007-09-02 ENCOUNTER — Telehealth (INDEPENDENT_AMBULATORY_CARE_PROVIDER_SITE_OTHER): Payer: Self-pay | Admitting: *Deleted

## 2007-09-03 ENCOUNTER — Ambulatory Visit: Payer: Self-pay | Admitting: Family Medicine

## 2007-09-11 ENCOUNTER — Encounter: Admission: RE | Admit: 2007-09-11 | Discharge: 2007-09-11 | Payer: Self-pay | Admitting: Family Medicine

## 2007-09-11 ENCOUNTER — Encounter (INDEPENDENT_AMBULATORY_CARE_PROVIDER_SITE_OTHER): Payer: Self-pay | Admitting: Family Medicine

## 2007-09-11 ENCOUNTER — Ambulatory Visit: Payer: Self-pay | Admitting: Family Medicine

## 2007-09-11 LAB — CONVERTED CEMR LAB: Prothrombin Time: 13.2 s (ref 11.6–15.2)

## 2007-09-22 ENCOUNTER — Encounter (INDEPENDENT_AMBULATORY_CARE_PROVIDER_SITE_OTHER): Payer: Self-pay | Admitting: Family Medicine

## 2007-09-24 ENCOUNTER — Telehealth (INDEPENDENT_AMBULATORY_CARE_PROVIDER_SITE_OTHER): Payer: Self-pay | Admitting: Family Medicine

## 2007-10-01 ENCOUNTER — Telehealth (INDEPENDENT_AMBULATORY_CARE_PROVIDER_SITE_OTHER): Payer: Self-pay | Admitting: Family Medicine

## 2007-10-03 ENCOUNTER — Ambulatory Visit: Payer: Self-pay | Admitting: Family Medicine

## 2007-10-03 ENCOUNTER — Encounter (INDEPENDENT_AMBULATORY_CARE_PROVIDER_SITE_OTHER): Payer: Self-pay | Admitting: Family Medicine

## 2007-10-03 LAB — CONVERTED CEMR LAB
Anticardiolipin IgA: 26 (ref <13)
Anticardiolipin IgG: <7 (ref <11)
Anticardiolipin IgM: 24 (ref <10)
ENA SM Ab Ser-aCnc: 0.2 (ref <1.0)
GGT: 2676 units/L — ABNORMAL HIGH (ref 7–51)
ds DNA Ab: <1 (ref <5)

## 2007-10-06 ENCOUNTER — Telehealth: Payer: Self-pay | Admitting: *Deleted

## 2007-10-16 ENCOUNTER — Ambulatory Visit: Payer: Self-pay | Admitting: Family Medicine

## 2007-10-16 DIAGNOSIS — R011 Cardiac murmur, unspecified: Secondary | ICD-10-CM | POA: Insufficient documentation

## 2007-10-21 ENCOUNTER — Telehealth (INDEPENDENT_AMBULATORY_CARE_PROVIDER_SITE_OTHER): Payer: Self-pay | Admitting: Family Medicine

## 2007-10-29 ENCOUNTER — Telehealth: Payer: Self-pay | Admitting: Family Medicine

## 2007-11-03 ENCOUNTER — Ambulatory Visit: Payer: Self-pay | Admitting: Sports Medicine

## 2007-11-11 ENCOUNTER — Encounter (INDEPENDENT_AMBULATORY_CARE_PROVIDER_SITE_OTHER): Payer: Self-pay | Admitting: Family Medicine

## 2007-11-17 ENCOUNTER — Telehealth (INDEPENDENT_AMBULATORY_CARE_PROVIDER_SITE_OTHER): Payer: Self-pay | Admitting: *Deleted

## 2007-11-18 ENCOUNTER — Telehealth (INDEPENDENT_AMBULATORY_CARE_PROVIDER_SITE_OTHER): Payer: Self-pay | Admitting: *Deleted

## 2007-11-19 ENCOUNTER — Encounter: Payer: Self-pay | Admitting: Family Medicine

## 2007-11-19 LAB — CONVERTED CEMR LAB
AST: 62 units/L
BUN: 6 mg/dL
Basophils Relative: 2 %
Calcium: 8.9 mg/dL
Chloride: 101 meq/L
Creatinine, Ser: 0.4 mg/dL
Eosinophils Relative: 15 %
HCT: 31.2 %
Hemoglobin: 10.4 g/dL
INR: 1.04
MCV: 88.8 fL
RDW: 13.3 %
Total Bilirubin: 1.3 mg/dL

## 2007-11-24 ENCOUNTER — Telehealth: Payer: Self-pay | Admitting: *Deleted

## 2007-12-04 ENCOUNTER — Ambulatory Visit: Payer: Self-pay | Admitting: Family Medicine

## 2007-12-05 ENCOUNTER — Encounter: Payer: Self-pay | Admitting: *Deleted

## 2007-12-08 ENCOUNTER — Encounter: Payer: Self-pay | Admitting: Family Medicine

## 2007-12-15 ENCOUNTER — Telehealth: Payer: Self-pay | Admitting: *Deleted

## 2007-12-17 ENCOUNTER — Ambulatory Visit: Payer: Self-pay | Admitting: Family Medicine

## 2008-01-01 ENCOUNTER — Ambulatory Visit: Payer: Self-pay | Admitting: Family Medicine

## 2008-01-02 ENCOUNTER — Telehealth: Payer: Self-pay | Admitting: *Deleted

## 2008-01-12 ENCOUNTER — Ambulatory Visit: Payer: Self-pay | Admitting: Family Medicine

## 2008-01-19 ENCOUNTER — Telehealth: Payer: Self-pay | Admitting: *Deleted

## 2008-01-21 ENCOUNTER — Ambulatory Visit: Payer: Self-pay | Admitting: Family Medicine

## 2008-01-23 ENCOUNTER — Telehealth: Payer: Self-pay | Admitting: *Deleted

## 2008-01-26 ENCOUNTER — Ambulatory Visit: Payer: Self-pay | Admitting: Sports Medicine

## 2008-01-26 DIAGNOSIS — Q6650 Congenital pes planus, unspecified foot: Secondary | ICD-10-CM | POA: Insufficient documentation

## 2008-02-04 ENCOUNTER — Ambulatory Visit: Payer: Self-pay | Admitting: Family Medicine

## 2008-02-04 ENCOUNTER — Encounter: Payer: Self-pay | Admitting: Family Medicine

## 2008-02-04 LAB — CONVERTED CEMR LAB

## 2008-02-12 ENCOUNTER — Telehealth: Payer: Self-pay | Admitting: Family Medicine

## 2008-02-12 ENCOUNTER — Encounter: Payer: Self-pay | Admitting: Family Medicine

## 2008-02-16 ENCOUNTER — Ambulatory Visit: Payer: Self-pay | Admitting: Family Medicine

## 2008-02-19 LAB — CONVERTED CEMR LAB
ALT: 57 units/L — ABNORMAL HIGH (ref 0–35)
Alkaline Phosphatase: 573 units/L — ABNORMAL HIGH (ref 39–117)
CO2: 22 meq/L (ref 19–32)
LDL Cholesterol: 142 mg/dL — ABNORMAL HIGH (ref 0–99)
Potassium: 4.4 meq/L (ref 3.5–5.3)
Sodium: 136 meq/L (ref 135–145)
Total Bilirubin: 1.5 mg/dL — ABNORMAL HIGH (ref 0.3–1.2)
Total Protein: 8.9 g/dL — ABNORMAL HIGH (ref 6.0–8.3)
VLDL: 19 mg/dL (ref 0–40)

## 2008-03-03 ENCOUNTER — Telehealth (INDEPENDENT_AMBULATORY_CARE_PROVIDER_SITE_OTHER): Payer: Self-pay | Admitting: *Deleted

## 2008-03-05 ENCOUNTER — Ambulatory Visit: Payer: Self-pay | Admitting: Family Medicine

## 2008-03-15 ENCOUNTER — Ambulatory Visit: Payer: Self-pay | Admitting: Family Medicine

## 2008-03-22 ENCOUNTER — Ambulatory Visit: Payer: Self-pay | Admitting: Family Medicine

## 2008-04-07 ENCOUNTER — Telehealth: Payer: Self-pay | Admitting: *Deleted

## 2008-04-09 ENCOUNTER — Ambulatory Visit: Payer: Self-pay | Admitting: Family Medicine

## 2008-04-19 ENCOUNTER — Telehealth: Payer: Self-pay | Admitting: *Deleted

## 2008-04-20 ENCOUNTER — Encounter: Payer: Self-pay | Admitting: *Deleted

## 2008-04-20 ENCOUNTER — Encounter (INDEPENDENT_AMBULATORY_CARE_PROVIDER_SITE_OTHER): Payer: Self-pay | Admitting: Family Medicine

## 2008-04-21 ENCOUNTER — Encounter (INDEPENDENT_AMBULATORY_CARE_PROVIDER_SITE_OTHER): Payer: Self-pay | Admitting: *Deleted

## 2008-04-21 ENCOUNTER — Emergency Department (HOSPITAL_COMMUNITY): Admission: EM | Admit: 2008-04-21 | Discharge: 2008-04-21 | Payer: Self-pay | Admitting: Emergency Medicine

## 2008-04-27 ENCOUNTER — Telehealth: Payer: Self-pay | Admitting: *Deleted

## 2008-04-29 ENCOUNTER — Telehealth: Payer: Self-pay | Admitting: *Deleted

## 2008-04-29 ENCOUNTER — Encounter: Payer: Self-pay | Admitting: Family Medicine

## 2008-05-06 ENCOUNTER — Telehealth: Payer: Self-pay | Admitting: Family Medicine

## 2008-05-10 ENCOUNTER — Ambulatory Visit: Payer: Self-pay | Admitting: Family Medicine

## 2008-05-12 ENCOUNTER — Encounter: Payer: Self-pay | Admitting: Family Medicine

## 2008-05-25 ENCOUNTER — Telehealth: Payer: Self-pay | Admitting: *Deleted

## 2008-05-27 ENCOUNTER — Ambulatory Visit: Payer: Self-pay | Admitting: Family Medicine

## 2008-05-27 LAB — CONVERTED CEMR LAB

## 2008-06-01 ENCOUNTER — Encounter: Payer: Self-pay | Admitting: Family Medicine

## 2008-06-01 ENCOUNTER — Telehealth (INDEPENDENT_AMBULATORY_CARE_PROVIDER_SITE_OTHER): Payer: Self-pay | Admitting: *Deleted

## 2008-06-03 ENCOUNTER — Telehealth: Payer: Self-pay | Admitting: *Deleted

## 2008-06-17 ENCOUNTER — Telehealth (INDEPENDENT_AMBULATORY_CARE_PROVIDER_SITE_OTHER): Payer: Self-pay | Admitting: *Deleted

## 2008-06-21 ENCOUNTER — Telehealth: Payer: Self-pay | Admitting: *Deleted

## 2008-06-21 ENCOUNTER — Ambulatory Visit: Payer: Self-pay | Admitting: Family Medicine

## 2008-06-23 ENCOUNTER — Telehealth: Payer: Self-pay | Admitting: Family Medicine

## 2008-06-24 ENCOUNTER — Ambulatory Visit: Payer: Self-pay | Admitting: Family Medicine

## 2008-07-21 ENCOUNTER — Ambulatory Visit: Payer: Self-pay | Admitting: Family Medicine

## 2008-07-21 DIAGNOSIS — K219 Gastro-esophageal reflux disease without esophagitis: Secondary | ICD-10-CM | POA: Insufficient documentation

## 2008-07-27 ENCOUNTER — Telehealth: Payer: Self-pay | Admitting: Family Medicine

## 2008-07-29 ENCOUNTER — Ambulatory Visit: Payer: Self-pay | Admitting: Family Medicine

## 2008-07-29 ENCOUNTER — Encounter: Payer: Self-pay | Admitting: Family Medicine

## 2008-08-02 ENCOUNTER — Telehealth: Payer: Self-pay | Admitting: Family Medicine

## 2008-08-06 ENCOUNTER — Telehealth: Payer: Self-pay | Admitting: Family Medicine

## 2008-08-09 ENCOUNTER — Ambulatory Visit: Payer: Self-pay | Admitting: Family Medicine

## 2008-08-20 ENCOUNTER — Telehealth: Payer: Self-pay | Admitting: *Deleted

## 2008-09-02 ENCOUNTER — Ambulatory Visit: Payer: Self-pay | Admitting: Family Medicine

## 2008-09-06 ENCOUNTER — Telehealth: Payer: Self-pay | Admitting: Family Medicine

## 2008-09-16 ENCOUNTER — Telehealth: Payer: Self-pay | Admitting: Family Medicine

## 2008-09-20 ENCOUNTER — Ambulatory Visit: Payer: Self-pay | Admitting: Family Medicine

## 2008-09-23 ENCOUNTER — Telehealth: Payer: Self-pay | Admitting: Family Medicine

## 2008-09-27 ENCOUNTER — Ambulatory Visit: Payer: Self-pay | Admitting: Family Medicine

## 2008-09-28 ENCOUNTER — Telehealth: Payer: Self-pay | Admitting: Family Medicine

## 2008-10-06 ENCOUNTER — Encounter: Payer: Self-pay | Admitting: Family Medicine

## 2008-10-07 ENCOUNTER — Telehealth: Payer: Self-pay | Admitting: Family Medicine

## 2008-10-07 ENCOUNTER — Encounter: Payer: Self-pay | Admitting: Family Medicine

## 2008-10-08 ENCOUNTER — Ambulatory Visit: Payer: Self-pay | Admitting: Family Medicine

## 2008-10-18 ENCOUNTER — Encounter: Payer: Self-pay | Admitting: Family Medicine

## 2008-11-02 ENCOUNTER — Telehealth: Payer: Self-pay | Admitting: Family Medicine

## 2008-11-10 ENCOUNTER — Telehealth: Payer: Self-pay | Admitting: Family Medicine

## 2008-11-11 LAB — CONVERTED CEMR LAB
HCT: 31.4 %
MCHC: 33.8 g/dL
MCV: 87 fL
Platelets: 207 10*3/uL
RDW: 14.4 %

## 2008-11-15 ENCOUNTER — Telehealth: Payer: Self-pay | Admitting: Family Medicine

## 2008-11-15 ENCOUNTER — Ambulatory Visit: Payer: Self-pay | Admitting: Family Medicine

## 2008-11-30 ENCOUNTER — Telehealth: Payer: Self-pay | Admitting: Family Medicine

## 2008-12-14 ENCOUNTER — Encounter: Payer: Self-pay | Admitting: Family Medicine

## 2008-12-16 ENCOUNTER — Encounter: Payer: Self-pay | Admitting: Family Medicine

## 2008-12-16 ENCOUNTER — Ambulatory Visit: Payer: Self-pay | Admitting: Family Medicine

## 2008-12-17 ENCOUNTER — Telehealth: Payer: Self-pay | Admitting: Family Medicine

## 2008-12-24 ENCOUNTER — Encounter: Payer: Self-pay | Admitting: Family Medicine

## 2008-12-31 ENCOUNTER — Telehealth: Payer: Self-pay | Admitting: Family Medicine

## 2008-12-31 ENCOUNTER — Ambulatory Visit: Payer: Self-pay | Admitting: Family Medicine

## 2008-12-31 LAB — CONVERTED CEMR LAB
Bilirubin Urine: NEGATIVE
Nitrite: NEGATIVE
Protein, U semiquant: NEGATIVE
Urobilinogen, UA: 0.2
WBC Urine, dipstick: NEGATIVE

## 2009-01-06 ENCOUNTER — Telehealth: Payer: Self-pay | Admitting: Family Medicine

## 2009-01-12 ENCOUNTER — Telehealth (INDEPENDENT_AMBULATORY_CARE_PROVIDER_SITE_OTHER): Payer: Self-pay | Admitting: *Deleted

## 2009-01-18 ENCOUNTER — Telehealth (INDEPENDENT_AMBULATORY_CARE_PROVIDER_SITE_OTHER): Payer: Self-pay | Admitting: *Deleted

## 2009-01-19 ENCOUNTER — Ambulatory Visit: Payer: Self-pay | Admitting: Family Medicine

## 2009-01-21 ENCOUNTER — Encounter: Payer: Self-pay | Admitting: Family Medicine

## 2009-01-26 ENCOUNTER — Telehealth: Payer: Self-pay | Admitting: Family Medicine

## 2009-01-28 ENCOUNTER — Ambulatory Visit: Payer: Self-pay | Admitting: Family Medicine

## 2009-01-28 ENCOUNTER — Encounter: Payer: Self-pay | Admitting: Family Medicine

## 2009-01-28 LAB — CONVERTED CEMR LAB
ALT: 67 units/L — ABNORMAL HIGH (ref 0–35)
AST: 87 units/L — ABNORMAL HIGH (ref 0–37)
Albumin: 3.4 g/dL — ABNORMAL LOW (ref 3.5–5.2)
BUN: 15 mg/dL (ref 6–23)
Blood in Urine, dipstick: NEGATIVE
Calcium: 8.2 mg/dL — ABNORMAL LOW (ref 8.4–10.5)
Chloride: 102 meq/L (ref 96–112)
Glucose, Urine, Semiquant: NEGATIVE
Hemoglobin: 9.9 g/dL — ABNORMAL LOW (ref 12.0–15.0)
Platelets: 255 10*3/uL (ref 150–400)
Potassium: 4.4 meq/L (ref 3.5–5.3)
Protein, U semiquant: NEGATIVE
RDW: 14.8 % (ref 11.5–15.5)
Total Protein: 8.6 g/dL — ABNORMAL HIGH (ref 6.0–8.3)
WBC Urine, dipstick: NEGATIVE
pH: 6.5

## 2009-02-01 ENCOUNTER — Telehealth: Payer: Self-pay | Admitting: *Deleted

## 2009-02-02 ENCOUNTER — Encounter: Payer: Self-pay | Admitting: Family Medicine

## 2009-02-04 ENCOUNTER — Ambulatory Visit: Payer: Self-pay | Admitting: Family Medicine

## 2009-02-07 ENCOUNTER — Ambulatory Visit: Payer: Self-pay

## 2009-02-08 ENCOUNTER — Encounter: Payer: Self-pay | Admitting: Family Medicine

## 2009-02-11 ENCOUNTER — Telehealth: Payer: Self-pay | Admitting: Family Medicine

## 2009-02-14 ENCOUNTER — Encounter: Payer: Self-pay | Admitting: Family Medicine

## 2009-02-18 ENCOUNTER — Telehealth (INDEPENDENT_AMBULATORY_CARE_PROVIDER_SITE_OTHER): Payer: Self-pay | Admitting: *Deleted

## 2009-02-21 ENCOUNTER — Encounter: Payer: Self-pay | Admitting: *Deleted

## 2009-02-21 ENCOUNTER — Telehealth: Payer: Self-pay | Admitting: Family Medicine

## 2009-03-02 ENCOUNTER — Telehealth: Payer: Self-pay | Admitting: Family Medicine

## 2009-03-04 ENCOUNTER — Ambulatory Visit: Payer: Self-pay | Admitting: Family Medicine

## 2009-03-07 ENCOUNTER — Telehealth (INDEPENDENT_AMBULATORY_CARE_PROVIDER_SITE_OTHER): Payer: Self-pay | Admitting: *Deleted

## 2009-03-07 ENCOUNTER — Encounter: Admission: RE | Admit: 2009-03-07 | Discharge: 2009-05-09 | Payer: Self-pay | Admitting: *Deleted

## 2009-03-08 ENCOUNTER — Encounter: Payer: Self-pay | Admitting: Family Medicine

## 2009-03-16 ENCOUNTER — Ambulatory Visit: Payer: Self-pay | Admitting: Family Medicine

## 2009-03-17 ENCOUNTER — Telehealth: Payer: Self-pay | Admitting: Family Medicine

## 2009-03-22 ENCOUNTER — Encounter: Payer: Self-pay | Admitting: Family Medicine

## 2009-03-22 LAB — CONVERTED CEMR LAB
ALT: 66 units/L
AST: 79 units/L
Alkaline Phosphatase: 515 units/L
Bilirubin, Direct: 0.7 mg/dL
Indirect Bilirubin: 0.6 mg/dL

## 2009-03-25 ENCOUNTER — Encounter: Payer: Self-pay | Admitting: Family Medicine

## 2009-03-28 ENCOUNTER — Telehealth: Payer: Self-pay | Admitting: Family Medicine

## 2009-03-29 ENCOUNTER — Encounter: Admission: RE | Admit: 2009-03-29 | Discharge: 2009-03-29 | Payer: Self-pay | Admitting: Gastroenterology

## 2009-03-30 ENCOUNTER — Telehealth: Payer: Self-pay | Admitting: Family Medicine

## 2009-04-05 ENCOUNTER — Telehealth: Payer: Self-pay | Admitting: Family Medicine

## 2009-04-06 ENCOUNTER — Ambulatory Visit: Payer: Self-pay | Admitting: Family Medicine

## 2009-04-06 LAB — CONVERTED CEMR LAB
Glucose, Urine, Semiquant: NEGATIVE
Ketones, urine, test strip: NEGATIVE
Nitrite: NEGATIVE
Urobilinogen, UA: 0.2
WBC Urine, dipstick: NEGATIVE
Whiff Test: NEGATIVE

## 2009-04-11 ENCOUNTER — Ambulatory Visit (HOSPITAL_COMMUNITY): Admission: RE | Admit: 2009-04-11 | Discharge: 2009-04-11 | Payer: Self-pay | Admitting: Gastroenterology

## 2009-04-11 ENCOUNTER — Encounter: Payer: Self-pay | Admitting: Family Medicine

## 2009-05-02 ENCOUNTER — Encounter: Payer: Self-pay | Admitting: Family Medicine

## 2009-05-02 ENCOUNTER — Telehealth: Payer: Self-pay | Admitting: Family Medicine

## 2009-05-04 ENCOUNTER — Encounter: Payer: Self-pay | Admitting: *Deleted

## 2009-05-18 ENCOUNTER — Telehealth: Payer: Self-pay | Admitting: Family Medicine

## 2009-06-01 ENCOUNTER — Ambulatory Visit: Payer: Self-pay | Admitting: Family Medicine

## 2009-06-13 ENCOUNTER — Ambulatory Visit: Payer: Self-pay | Admitting: Family Medicine

## 2009-06-16 ENCOUNTER — Telehealth (INDEPENDENT_AMBULATORY_CARE_PROVIDER_SITE_OTHER): Payer: Self-pay | Admitting: *Deleted

## 2009-06-22 ENCOUNTER — Ambulatory Visit: Payer: Self-pay | Admitting: Family Medicine

## 2009-07-10 ENCOUNTER — Telehealth: Payer: Self-pay | Admitting: Family Medicine

## 2009-07-11 ENCOUNTER — Telehealth: Payer: Self-pay | Admitting: Family Medicine

## 2009-07-14 ENCOUNTER — Telehealth: Payer: Self-pay | Admitting: Family Medicine

## 2009-08-04 ENCOUNTER — Ambulatory Visit: Payer: Self-pay | Admitting: Family Medicine

## 2009-08-17 ENCOUNTER — Encounter: Payer: Self-pay | Admitting: Family Medicine

## 2009-08-18 ENCOUNTER — Telehealth: Payer: Self-pay | Admitting: Family Medicine

## 2009-08-18 ENCOUNTER — Encounter: Payer: Self-pay | Admitting: Family Medicine

## 2009-08-19 ENCOUNTER — Encounter: Payer: Self-pay | Admitting: Family Medicine

## 2009-08-24 ENCOUNTER — Encounter: Payer: Self-pay | Admitting: Family Medicine

## 2009-09-08 ENCOUNTER — Encounter: Payer: Self-pay | Admitting: Family Medicine

## 2009-09-09 ENCOUNTER — Encounter: Payer: Self-pay | Admitting: Family Medicine

## 2009-09-09 LAB — CONVERTED CEMR LAB
AST: 88 units/L
Alkaline Phosphatase: 439 units/L
HCT: 29.5 %
INR: 1.1
Indirect Bilirubin: 0.7 mg/dL
MCV: 89.2 fL
Prothrombin Time: 13.7 s
Total Protein: 7.9 g/dL

## 2009-09-13 ENCOUNTER — Ambulatory Visit: Payer: Self-pay | Admitting: Family Medicine

## 2009-09-20 ENCOUNTER — Encounter: Payer: Self-pay | Admitting: Family Medicine

## 2009-10-04 ENCOUNTER — Telehealth: Payer: Self-pay | Admitting: Family Medicine

## 2009-10-26 ENCOUNTER — Telehealth: Payer: Self-pay | Admitting: Family Medicine

## 2009-11-02 ENCOUNTER — Encounter: Payer: Self-pay | Admitting: Family Medicine

## 2009-11-08 ENCOUNTER — Ambulatory Visit: Payer: Self-pay | Admitting: Family Medicine

## 2009-11-08 LAB — CONVERTED CEMR LAB
Bilirubin Urine: POSITIVE
Glucose, Urine, Semiquant: NEGATIVE
Ketones, urine, test strip: NEGATIVE
Nitrite: NEGATIVE
Protein, U semiquant: NEGATIVE
Specific Gravity, Urine: 1.02

## 2009-11-30 ENCOUNTER — Encounter: Payer: Self-pay | Admitting: Family Medicine

## 2009-12-02 ENCOUNTER — Telehealth: Payer: Self-pay | Admitting: Family Medicine

## 2009-12-09 ENCOUNTER — Telehealth: Payer: Self-pay | Admitting: *Deleted

## 2009-12-14 ENCOUNTER — Ambulatory Visit: Payer: Self-pay | Admitting: Family Medicine

## 2009-12-23 ENCOUNTER — Telehealth: Payer: Self-pay | Admitting: Family Medicine

## 2009-12-26 ENCOUNTER — Telehealth: Payer: Self-pay | Admitting: Family Medicine

## 2009-12-26 ENCOUNTER — Encounter: Payer: Self-pay | Admitting: Family Medicine

## 2009-12-27 ENCOUNTER — Encounter: Payer: Self-pay | Admitting: Family Medicine

## 2009-12-29 ENCOUNTER — Telehealth: Payer: Self-pay | Admitting: *Deleted

## 2010-01-02 ENCOUNTER — Telehealth: Payer: Self-pay | Admitting: Family Medicine

## 2010-01-04 ENCOUNTER — Telehealth: Payer: Self-pay | Admitting: Family Medicine

## 2010-01-06 ENCOUNTER — Telehealth (INDEPENDENT_AMBULATORY_CARE_PROVIDER_SITE_OTHER): Payer: Self-pay | Admitting: *Deleted

## 2010-01-26 ENCOUNTER — Ambulatory Visit: Payer: Self-pay | Admitting: Family Medicine

## 2010-01-26 ENCOUNTER — Encounter: Payer: Self-pay | Admitting: Family Medicine

## 2010-01-26 LAB — CONVERTED CEMR LAB
BUN: 19 mg/dL (ref 6–23)
Bilirubin Urine: POSITIVE
Blood in Urine, dipstick: NEGATIVE
CO2: 24 meq/L (ref 19–32)
Calcium: 8.8 mg/dL (ref 8.4–10.5)
Chloride: 104 meq/L (ref 96–112)
Creatinine, Ser: 0.44 mg/dL (ref 0.40–1.20)
Glucose, Bld: 99 mg/dL (ref 70–99)
Glucose, Urine, Semiquant: NEGATIVE
Ketones, urine, test strip: NEGATIVE
Nitrite: NEGATIVE
Potassium: 4.1 meq/L (ref 3.5–5.3)
Protein, U semiquant: NEGATIVE
Sodium: 140 meq/L (ref 135–145)
Specific Gravity, Urine: 1.025
Urobilinogen, UA: 2
WBC Urine, dipstick: NEGATIVE
Whiff Test: NEGATIVE
pH: 6.5

## 2010-01-27 ENCOUNTER — Encounter: Payer: Self-pay | Admitting: Family Medicine

## 2010-02-05 ENCOUNTER — Telehealth: Payer: Self-pay | Admitting: Internal Medicine

## 2010-02-07 ENCOUNTER — Ambulatory Visit: Payer: Self-pay | Admitting: Family Medicine

## 2010-02-10 ENCOUNTER — Telehealth: Payer: Self-pay | Admitting: Family Medicine

## 2010-02-16 ENCOUNTER — Ambulatory Visit: Payer: Self-pay | Admitting: Family Medicine

## 2010-02-17 ENCOUNTER — Telehealth: Payer: Self-pay | Admitting: *Deleted

## 2010-02-23 ENCOUNTER — Encounter: Payer: Self-pay | Admitting: Family Medicine

## 2010-02-24 ENCOUNTER — Telehealth: Payer: Self-pay | Admitting: Family Medicine

## 2010-02-27 ENCOUNTER — Encounter: Payer: Self-pay | Admitting: Family Medicine

## 2010-02-27 ENCOUNTER — Telehealth: Payer: Self-pay | Admitting: Family Medicine

## 2010-02-28 ENCOUNTER — Encounter: Payer: Self-pay | Admitting: Family Medicine

## 2010-03-03 ENCOUNTER — Ambulatory Visit: Payer: Self-pay | Admitting: Family Medicine

## 2010-03-03 ENCOUNTER — Encounter: Payer: Self-pay | Admitting: Family Medicine

## 2010-03-03 DIAGNOSIS — D649 Anemia, unspecified: Secondary | ICD-10-CM | POA: Insufficient documentation

## 2010-03-03 LAB — CONVERTED CEMR LAB
ALT: 56 units/L — ABNORMAL HIGH (ref 0–35)
AST: 82 units/L — ABNORMAL HIGH (ref 0–37)
Albumin: 3.6 g/dL (ref 3.5–5.2)
Alkaline Phosphatase: 463 units/L — ABNORMAL HIGH (ref 39–117)
BUN: 10 mg/dL (ref 6–23)
Beta hcg, urine, semiquantitative: NEGATIVE
Blood in Urine, dipstick: NEGATIVE
CO2: 24 meq/L (ref 19–32)
Calcium: 8.5 mg/dL (ref 8.4–10.5)
Chloride: 102 meq/L (ref 96–112)
Creatinine, Ser: 0.43 mg/dL (ref 0.40–1.20)
Glucose, Bld: 96 mg/dL (ref 70–99)
Glucose, Urine, Semiquant: NEGATIVE
HCT: 30.5 % — ABNORMAL LOW (ref 36.0–46.0)
Hemoglobin: 10.1 g/dL — ABNORMAL LOW (ref 12.0–15.0)
MCHC: 33.1 g/dL (ref 30.0–36.0)
MCV: 86.2 fL (ref 78.0–100.0)
Nitrite: NEGATIVE
Platelets: 203 10*3/uL (ref 150–400)
Potassium: 4.1 meq/L (ref 3.5–5.3)
RBC: 3.54 M/uL — ABNORMAL LOW (ref 3.87–5.11)
RDW: 14.3 % (ref 11.5–15.5)
Sodium: 137 meq/L (ref 135–145)
Specific Gravity, Urine: 1.02
Total Bilirubin: 2.3 mg/dL — ABNORMAL HIGH (ref 0.3–1.2)
Total Protein: 8.2 g/dL (ref 6.0–8.3)
Urobilinogen, UA: 1
WBC Urine, dipstick: NEGATIVE
WBC: 4.1 10*3/uL (ref 4.0–10.5)
pH: 7

## 2010-03-06 ENCOUNTER — Encounter: Admission: RE | Admit: 2010-03-06 | Discharge: 2010-03-06 | Payer: Self-pay | Admitting: Allergy and Immunology

## 2010-03-09 ENCOUNTER — Telehealth: Payer: Self-pay | Admitting: Family Medicine

## 2010-03-12 ENCOUNTER — Encounter: Payer: Self-pay | Admitting: Family Medicine

## 2010-03-14 ENCOUNTER — Telehealth: Payer: Self-pay | Admitting: Family Medicine

## 2010-03-28 ENCOUNTER — Ambulatory Visit: Payer: Self-pay | Admitting: Family Medicine

## 2010-03-30 ENCOUNTER — Telehealth (INDEPENDENT_AMBULATORY_CARE_PROVIDER_SITE_OTHER): Payer: Self-pay | Admitting: *Deleted

## 2010-04-07 ENCOUNTER — Ambulatory Visit: Payer: Self-pay | Admitting: Family Medicine

## 2010-04-10 ENCOUNTER — Telehealth: Payer: Self-pay | Admitting: *Deleted

## 2010-04-12 ENCOUNTER — Encounter: Payer: Self-pay | Admitting: Family Medicine

## 2010-04-12 ENCOUNTER — Ambulatory Visit: Payer: Self-pay | Admitting: Family Medicine

## 2010-04-12 LAB — CONVERTED CEMR LAB
ALT: 72 units/L — ABNORMAL HIGH (ref 0–35)
AST: 94 units/L — ABNORMAL HIGH (ref 0–37)
Alkaline Phosphatase: 408 units/L — ABNORMAL HIGH (ref 39–117)
CO2: 23 meq/L (ref 19–32)
MCHC: 34.2 g/dL (ref 30.0–36.0)
MCV: 86.5 fL (ref 78.0–100.0)
Platelets: 168 10*3/uL (ref 150–400)
RDW: 14.2 % (ref 11.5–15.5)
Sodium: 137 meq/L (ref 135–145)
Total Bilirubin: 1.9 mg/dL — ABNORMAL HIGH (ref 0.3–1.2)
Total Protein: 8.2 g/dL (ref 6.0–8.3)

## 2010-04-14 ENCOUNTER — Encounter: Payer: Self-pay | Admitting: Family Medicine

## 2010-04-14 ENCOUNTER — Telehealth: Payer: Self-pay | Admitting: *Deleted

## 2010-04-17 ENCOUNTER — Encounter: Admission: RE | Admit: 2010-04-17 | Discharge: 2010-04-17 | Payer: Self-pay | Admitting: Family Medicine

## 2010-04-25 ENCOUNTER — Telehealth: Payer: Self-pay | Admitting: *Deleted

## 2010-04-28 ENCOUNTER — Encounter: Payer: Self-pay | Admitting: Family Medicine

## 2010-04-28 ENCOUNTER — Ambulatory Visit: Payer: Self-pay | Admitting: Family Medicine

## 2010-04-28 LAB — CONVERTED CEMR LAB
BUN: 12 mg/dL (ref 6–23)
CO2: 24 meq/L (ref 19–32)
Calcium: 8.2 mg/dL — ABNORMAL LOW (ref 8.4–10.5)
Carbamazepine Lvl: 4.8 ug/mL (ref 4.0–12.0)
Chloride: 104 meq/L (ref 96–112)
Creatinine, Ser: 0.46 mg/dL (ref 0.40–1.20)
Glucose, Bld: 107 mg/dL — ABNORMAL HIGH (ref 70–99)
Potassium: 3.9 meq/L (ref 3.5–5.3)
Sodium: 139 meq/L (ref 135–145)
Valproic Acid Lvl: 52.1 ug/mL (ref 50.0–100.0)

## 2010-05-08 ENCOUNTER — Telehealth: Payer: Self-pay | Admitting: Family Medicine

## 2010-05-18 ENCOUNTER — Ambulatory Visit: Payer: Self-pay | Admitting: Family Medicine

## 2010-05-25 ENCOUNTER — Telehealth: Payer: Self-pay | Admitting: *Deleted

## 2010-05-28 DIAGNOSIS — K743 Primary biliary cirrhosis: Secondary | ICD-10-CM

## 2010-05-28 HISTORY — DX: Primary biliary cirrhosis: K74.3

## 2010-05-31 DIAGNOSIS — M26629 Arthralgia of temporomandibular joint, unspecified side: Secondary | ICD-10-CM | POA: Insufficient documentation

## 2010-06-05 ENCOUNTER — Telehealth: Payer: Self-pay | Admitting: Family Medicine

## 2010-06-07 ENCOUNTER — Ambulatory Visit: Admission: RE | Admit: 2010-06-07 | Discharge: 2010-06-07 | Payer: Self-pay | Source: Home / Self Care

## 2010-06-07 ENCOUNTER — Encounter: Payer: Self-pay | Admitting: Family Medicine

## 2010-06-07 DIAGNOSIS — N920 Excessive and frequent menstruation with regular cycle: Secondary | ICD-10-CM | POA: Insufficient documentation

## 2010-06-07 LAB — CONVERTED CEMR LAB
HCT: 30.7 % — ABNORMAL LOW (ref 36.0–46.0)
Hemoglobin: 10.2 g/dL — ABNORMAL LOW (ref 12.0–15.0)
MCHC: 33.2 g/dL (ref 30.0–36.0)
MCV: 87 fL (ref 78.0–100.0)
Platelets: 227 10*3/uL (ref 150–400)
RBC: 3.53 M/uL — ABNORMAL LOW (ref 3.87–5.11)
RDW: 14.8 % (ref 11.5–15.5)
WBC: 6.3 10*3/uL (ref 4.0–10.5)

## 2010-06-14 ENCOUNTER — Ambulatory Visit: Admission: RE | Admit: 2010-06-14 | Discharge: 2010-06-14 | Payer: Self-pay | Source: Home / Self Care

## 2010-06-14 DIAGNOSIS — R05 Cough: Secondary | ICD-10-CM | POA: Insufficient documentation

## 2010-06-14 DIAGNOSIS — R319 Hematuria, unspecified: Secondary | ICD-10-CM | POA: Insufficient documentation

## 2010-06-14 DIAGNOSIS — K625 Hemorrhage of anus and rectum: Secondary | ICD-10-CM | POA: Insufficient documentation

## 2010-06-14 DIAGNOSIS — R059 Cough, unspecified: Secondary | ICD-10-CM | POA: Insufficient documentation

## 2010-06-14 LAB — CONVERTED CEMR LAB
Bilirubin Urine: POSITIVE
Blood in Urine, dipstick: NEGATIVE
Glucose, Urine, Semiquant: NEGATIVE
Ketones, urine, test strip: NEGATIVE
Nitrite: NEGATIVE
Protein, U semiquant: NEGATIVE
Specific Gravity, Urine: 1.02
Urobilinogen, UA: 0.2
WBC Urine, dipstick: NEGATIVE
pH: 5.5

## 2010-06-17 ENCOUNTER — Encounter: Payer: Self-pay | Admitting: Family Medicine

## 2010-06-27 NOTE — Assessment & Plan Note (Signed)
Summary: not feeling well,tcb   Vital Signs:  Patient profile:   35 year old female Height:      61 inches Weight:      148.1 pounds BMI:     28.08 Temp:     98.8 degrees F oral Pulse rate:   80 / minute BP sitting:   105 / 73  (left arm) Cuff size:   regular  Vitals Entered By: Garen Grams LPN (March 03, 2010 9:06 AM) CC: allergies, heavy periods Is Patient Diabetic? No Pain Assessment Patient in pain? no        Primary Care Provider:  Helane Rima DO  CC:  allergies and heavy periods.  History of Present Illness: 35 yo F:  1. Allergic Rhinitis: Followed by Allergist. Reviewed medications. Not worse than usual. Made better with medications/allergy shots. Endorses runny nose, congestion, PND, cough. Denies HA, dizziness, CP, SOB, ear pain, wheeze, LE edema, rash, fever/chills. Has upcoming appointment with Allergist.  2. Heavy Menses: Regular menses, lasts 5-10 days, heavy. Not sexually active. Not on meds. Endorses fatigue and PMHx anemia, fe deficiency. Denies vaginal discharge, odor, and itch.  3. Social: Kateri Mc is POA. Updated Social Hx. Offerred monthly visits with patient since she has been calling so often lately with vague general s/s. Patient agreed.  Habits & Providers  Alcohol-Tobacco-Diet     Alcohol drinks/day: 0     Tobacco Status: never     Passive Smoke Exposure: yes  Current Medications (verified): 1)  Depakote 250 Mg Tbec (Divalproex Sodium) .... Take 1 Tablet By Mouth Four Times A Day 2)  Tegretol Xr 400 Mg Tb12 (Carbamazepine) .... Take 1 Tablet By Mouth Twice A Day 3)  Fexofenadine Hcl 180 Mg Tabs (Fexofenadine Hcl) .... Take 1 Tablet By Mouth Once A Day 4)  Flonase 50 Mcg/act  Susp (Fluticasone Propionate) .... 2 Sprays Into Each Nostril Daily For Allergic Rhinitis 5)  Albuterol 90 Mcg/act Aers (Albuterol) .... 2 Puffs Every 4 Hours As Needed 6)  Singulair 10 Mg  Tabs (Montelukast Sodium) .... One Tablet Daily For Rhinitis 7)  Ipratropium  Bromide 0.06 %  Soln (Ipratropium Bromide) .... 2 Sprays Into Each Nostril Every 8-12 Hours As Needed For Runny Nose Due To Allergies 8)  Symbicort 160-4.5 Mcg/act Aero (Budesonide-Formoterol Fumarate) .... 2 Inhalation Bid 9)  Omeprazole 20 Mg Cpdr (Omeprazole) .... One By Mouth Daily 10)  Ibuprofen 600 Mg  Tabs (Ibuprofen) .Marland Kitchen.. 1 Tab By Mouth Q 6 Hours Prn For Pain 11)  Selenium Sulfide 2.5 % Lotn (Selenium Sulfide) .... Use Daily As Directed Disp 1 Bottle 12)  Caltrate 600+d Plus 600-400 Mg-Unit Chew (Calcium Carbonate-Vit D-Min) .Marland Kitchen.. 1 Tablet By Mouth Daily 13)  Nystatin 100000 Unit/gm Crea (Nystatin) .... Apply To Groin Two Times A Day As Needed For Rash 14)  Omeprazole 40 Mg Cpdr (Omeprazole) .... One Tab in Am, One Tab in The Pm X 2 Weeks, Then One Tab Daily  Allergies (verified): 1)  ! Sudafed 2)  ! Sulfa 3)  Tramadol Hcl (Tramadol Hcl)  Past History:  Past Medical History: MENORRHAGIA (ICD-626.2)    - Depo Provera, started 02/2010 ANEMIA (ICD-285.9) MENTAL RETARDATION (ICD-319) ASTHMA, PERSISTENT, MILD (ICD-493.90) GERD (ICD-530.81) SOMATIZATION DISORDER (ICD-300.81) ALLERGIC RHINITIS (ICD-477.9)    - Followed by Allergy Specialist EPILEPSY (ICD-780.39)    - Followed by Neurology LIVER FUNCTION TESTS, ABNORMAL (ICD-794.8)    - Followed by GI, Dr. Matthias Hughs OSTEOARTHRITIS, LOWER LEG (ICD-715.96) OBESITY, NOS (ICD-278.00) CARDIAC FLOW MURMUR (ICD-785.2) PES  PLANUS (ICD-754.61)  Past Surgical History: Abd [place] -  mild diffuse fatty infiltrate - 11/08/2003 Baseline peak flows 440 - 05/03/2007 Echo - nl EF, tr TR - 09/10/2001 Flexible Sigmoidoscopy - WNL 11/97, 4/02  PMH-FH-SH reviewed for relevance  Family History: MGF - SLE, anemia; interstitial lung dz; died at 65yo, mother deceased 2/2 OHS, COPD, hemorrhage 2/2 coumadin tx for DVT/PE. Mom had  DM, HTN, asthma, fibromyalgia, somatization, and morbid  obesity.  Social History: Pt lives at home alone.  Her Uncle lives next  door.  She has a home health aid who visits three hours a day to help with cooking, cleaning.  Adair can also do these things herself.  She used to live with and care for her mother.  Her mother passed away early in 10-15-2006.  POA: Haydee Jabbour [phone]. Demetria is okay with direct communication between PCP and POA.   Review of Systems      See HPI  Physical Exam  General:  Well-developed, well-nourished, in no acute distress; alert, appropriate and cooperative throughout examination. Vitals reviewed. Eyes:  No injection. Ears:  R ear normal and L ear normal.   Nose:  Mild nasal discharge, mucosal pallor.   Mouth:  MMM, no PND. Neck:  No deformities, masses, or tenderness noted. Lungs:  Normal WOB, no wheezing or crackles, good air movement.  Heart:  Normal rate and regular rhythm.  Abdomen:  Bowel sounds positive, abdomen soft and non-tender without masses, organomegaly or hernias noted.   Impression & Recommendations:  Problem # 1:  ALLERGIC RHINITIS (ICD-477.9) Assessment Unchanged Continue current medications per Allergist. No Red Flags. Her updated medication list for this problem includes:    Fexofenadine Hcl 180 Mg Tabs (Fexofenadine hcl) .Marland Kitchen... Take 1 tablet by mouth once a day    Flonase 50 Mcg/act Susp (Fluticasone propionate) .Marland Kitchen... 2 sprays into each nostril daily for allergic rhinitis    Ipratropium Bromide 0.06 % Soln (Ipratropium bromide) .Marland Kitchen... 2 sprays into each nostril every 8-12 hours as needed for runny nose due to allergies  Orders: [facility]- Est  Level 4 (11914)  Problem # 2:  MENORRHAGIA (ICD-626.2) Assessment: New  Depo Provera s/p negative Upreg. Check CBC. Orders: [facility]- Est  Level 4 (78295)  Her updated medication list for this problem includes:    Depo-provera 150 Mg/ml Susp (Medroxyprogesterone acetate) ..... Q 3 months, per protocol  Problem # 3:  ANEMIA (ICD-285.9) Assessment: Unchanged Hx of, check CBC for need for iron  supplementation. Orders: CBC-[facility] (62130) [facility]- Est  Level 4 (86578)  Problem # 4:  LIVER FUNCTION TESTS, ABNORMAL (ICD-794.8) Assessment: Unchanged Hx of, + dark urine when checking UPreg. Will f/u with CMP, UA. Patient states that she had recent appointment with Dr. Matthias Hughs - "I have gallbladder disease that he is watching." Will request records.  Orders: Comp Met-[facility] ([id number]) Urinalysis-[facility] (00000) [facility]- Est  Level 4 (13244)  Problem # 5:  SOMATIZATION DISORDER (ICD-300.81) Assessment: Unchanged Monthly appointments. Will call POA to make sure that she is getting enough support.  Complete Medication List: 1)  Depakote 250 Mg Tbec (Divalproex sodium) .... Take 1 tablet by mouth four times a day 2)  Tegretol Xr 400 Mg Tb12 (Carbamazepine) .... Take 1 tablet by mouth twice a day 3)  Fexofenadine Hcl 180 Mg Tabs (Fexofenadine hcl) .... Take 1 tablet by mouth once a day 4)  Flonase 50 Mcg/act Susp (Fluticasone propionate) .... 2 sprays into each nostril daily for allergic rhinitis 5)  Albuterol  90 Mcg/act Aers (Albuterol) .... 2 puffs every 4 hours as needed 6)  Singulair 10 Mg Tabs (Montelukast sodium) .... One tablet daily for rhinitis 7)  Ipratropium Bromide 0.06 % Soln (Ipratropium bromide) .... 2 sprays into each nostril every 8-12 hours as needed for runny nose due to allergies 8)  Symbicort 160-4.5 Mcg/act Aero (Budesonide-formoterol fumarate) .... 2 inhalation bid 9)  Omeprazole 20 Mg Cpdr (Omeprazole) .... One by mouth daily 10)  Ibuprofen 600 Mg Tabs (Ibuprofen) .Marland Kitchen.. 1 tab by mouth q 6 hours prn for pain 11)  Selenium Sulfide 2.5 % Lotn (Selenium sulfide) .... Use daily as directed disp 1 bottle 12)  Caltrate 600+d Plus 600-400 Mg-unit Chew (Calcium carbonate-vit d-min) .Marland Kitchen.. 1 tablet by mouth daily 13)  Nystatin 100000 Unit/gm Crea (Nystatin) .... Apply to groin two times a day as needed for rash 14)  Omeprazole 40 Mg Cpdr (Omeprazole) .... One tab in am, one tab in the pm x  2 weeks, then one tab daily 15)  Depo-provera 150 Mg/ml Susp (Medroxyprogesterone acetate) .... Q 3 months, per protocol  Other Orders: U Preg-[facility] (04540) Influenza Vaccine NON MCR (98119) Depo-Provera 150mg  (J4782)  Patient Instructions: 1)  It was nice to see you today! 2)  Follow up in one month.  Laboratory Results   Urine Tests  Date/Time Received: March 03, 2010 9:52 AM  Date/Time Reported: March 03, 2010 10:00 AM   Routine Urinalysis   Color: brown/coffee Appearance: Clear Glucose: negative   (Normal Range: Negative) Bilirubin: large;  reflex ictotest = large   (Normal Range: Negative) Ketone: trace (5)   (Normal Range: Negative) Spec. Gravity: 1.020   (Normal Range: 1.003-1.035) Blood: negative   (Normal Range: Negative) pH: 7.0   (Normal Range: 5.0-8.0) Protein: trace   (Normal Range: Negative) Urobilinogen: 1.0   (Normal Range: 0-1) Nitrite: negative   (Normal Range: Negative) Leukocyte Esterace: negative   (Normal Range: Negative)  Urine Microscopic WBC/HPF: rare Bacteria/HPF: 1+ Mucous/HPF: 2+ Epithelial/HPF: occ    Urine HCG: negative Comments: u preg ...........test performed by...........Marland KitchenTerese Door, CMA urine ...............test performed by......Marland KitchenBonnie A. [place], MLS (ASCP)cm        Immunizations Administered:  Influenza Vaccine # 1:    Vaccine Type: Fluvax Non-MCR    Site: left deltoid    Mfr: GlaxoSmithKline    Dose: 0.5 ml    Route: IM    Given by: Jone Baseman CMA    Exp. Date: 11/22/2010    Lot #: [id number]    VIS given: 12/20/09 version given March 03, 2010.  Flu Vaccine Consent Questions:    Do you have a history of severe allergic reactions to this vaccine? no    Any prior history of allergic reactions to egg and/or gelatin? no    Do you have a sensitivity to the preservative Thimersol? no    Do you have a past history of Guillan-Barre Syndrome? no    Do you currently have an acute febrile illness? no    Have  you ever had a severe reaction to latex? no    Vaccine information given and explained to patient? yes    Are you currently pregnant? no    Medication Administration  Injection # 1:    Medication: Depo-Provera 150mg     Diagnosis: CONTRACEPTIVE MANAGEMENT (ICD-V25.09)    Route: IM    Site: R deltoid    Exp Date: 07/26/2012    Lot #: [id number]    Mfr: [email or web address]    Comments:  Next depo due 12.23.11 - 1.6.11    Patient tolerated injection without complications    Given by: Jone Baseman CMA (March 03, 2010 12:10 PM)  Orders Added: 1)  CBC-[facility] [85027] 2)  U Preg-[facility] [81025] 3)  Comp Met-[facility] [[id number]] 4)  Urinalysis-[facility] [00000] 5)  Influenza Vaccine NON MCR [00028] 6)  Depo-Provera 150mg  [J1055] 7)  [facility]- Est  Level 4 [91478]

## 2010-06-27 NOTE — Progress Notes (Signed)
Summary: see message for pt/ts  Phone Note Call from Patient   Caller: Patient Call For: [phone] Summary of Call: Patient having chest discomfort,headaches,runny nose and cough.  Patient says this happens everytime she has her cycle.  Want to know why this is.  Please call her back. Initial call taken by: Abundio Miu,  February 17, 2010 3:39 PM  Follow-up for Phone Call        I will not be available to call the patient until Tuesday. Will send to [facility] for triage - patient has Hx of needing reassurance. She was seen last week and did not have these symptoms. I appreciate Sally's help with this. Follow-up by: Helane Rima DO,  February 19, 2010 4:36 PM  Additional Follow-up for Phone Call Additional follow up Details #1::        LM asking her to call me back Additional Follow-up by: [facility] RN,  February 20, 2010 10:35 AM    Additional Follow-up for Phone Call Additional follow up Details #2::    told her her allergy & asthma symptoms may not be related to her peroids. states she is taking all meds as ordered & is not out of any. says she frequently feels this way when she gets her periods & called her allergist & he/she gives her prednisone. told her as we have no appts left she may want to call the allergist & see if they can see her.  c/o cold symptoms. states she has used [employer] [facility] before. told her she can try it again. thinks she is sick because she was out in the rain without an ummbrella and she did not have a sweater onn & our offices were cold.  told her I will send this to pcp & will call her with md response. told her many women feel bad during their periods. may take tylenol for headache, myalgias  Follow-up by: [facility] RN,  February 20, 2010 3:54 PM  Additional Follow-up for Phone Call Additional follow up Details #3:: Details for Additional Follow-up Action Taken: agree with RN response to patient re: symptomatic treatment of vague  symptoms. if she feels that her symptoms are worsening, she may make an appointment to be seen.  Additional Follow-up by: Helane Rima DO,  February 22, 2010 8:36 AM

## 2010-06-27 NOTE — Consult Note (Signed)
Summary: [facility]   Imported By: Clydell Hakim 09/15/2009 09:39:33  _____________________________________________________________________  External Attachment:    Type:   Image     Comment:   External Document

## 2010-06-27 NOTE — Miscellaneous (Signed)
Summary: PA request  Clinical Lists Changes PA required for Ipratropium nasal spray. Form placed in Dr. Deirdre Peer box. Theresia Lo RN  April 14, 2010 8:44 AM    Will defer to Dr. Earlene Plater as I am not aware of pt history and can not document her previous meds Milinda Antis MD  April 14, 2010 5:03 PM   Appended Document: PA request wants to know if PA has been sent in for this med? [facility]- [address]  Appended Document: PA request Done. If does not go through, she may need to get request through allergist.  Appended Document: PA request Recieved PA form back stating the form that was completed (inhaled corticosteroid form) was incorrect for the ipratropium nasal spray.  Called [phone]- was advised that the standard drug form should be used in the future.  Was able to complete PA on the phone as pt had tried 2 of the preferred meds in the past, and this was on record with PA company.  Called [facility] and advised PA was complete, they verified, and will fill pt's med.

## 2010-06-27 NOTE — Progress Notes (Signed)
Summary: phn msg  Phone Note Call from Patient Call back at Home Phone [phone]   Caller: Patient Summary of Call: wants to know if she has finished w/ FL2 Initial call taken by: De Nurse,  December 23, 2009 2:25 PM  Follow-up for Phone Call        Completed and put in to be faxed pile. Follow-up by: Helane Rima DO,  December 25, 2009 11:11 AM

## 2010-06-27 NOTE — Letter (Signed)
Summary: Generic Letter  [facility]  [address]   [place], [place] [address]   Phone: [phone]  Fax: [phone]    01/27/2010  [name] [address] [place], [place]  [address]  Dear Ms. Bogie,  I am happy to inform you that your recent labs were all normal.  Sincerely,   Helane Rima DO  Appended Document: Generic Letter mailed

## 2010-06-27 NOTE — Miscellaneous (Signed)
Summary: congested  Clinical Lists Changes she states she started prednisone fri & had a breathing treatment by allergy md. was told if worse to call them. no longer on prednisone. breathing ok per pt. just chest congestion. green mucous when she coughs. she is to go get a CT & Xray per Dr. Willa Rough. states eyes are blurry(they were jumpy earlier) & she feels confused off & on. also dizzy & goes into a deep sleep. she is crying. states she also has "light CP" on R at times. no way to get to UC. w have no appts left today. she is not sure if she wants to go to ED. told her to sit & wait until I speak with her pcp. I will call her back.[facility] RN  February 28, 2010 3:06 PM  Called patient. Discussed above issues. Patient reassured. Advised patient to make appointment with me on Friday. Okay to double book. Will call patient's uncle (caregiver) to discuss frequent calls and possible need for more help at home. Helane Rima DO  February 28, 2010 5:52 PM

## 2010-06-27 NOTE — Assessment & Plan Note (Signed)
Summary: stomach pain,diarrhea/tlb   Vital Signs:  Patient profile:   35 year old female Height:      61 inches Weight:      151 pounds BMI:     28.63 Temp:     98.4 degrees F oral Pulse rate:   84 / minute BP sitting:   110 / 73  (left arm) Cuff size:   regular  Vitals Entered By: Jimmy Footman, CMA (April 12, 2010 9:59 AM) CC: stomach pains & diarrhea x2 weeks Is Patient Diabetic? No   Primary Care Provider:  Helane Rima DO  CC:  stomach pains & diarrhea x2 weeks.  History of Present Illness: [name] presents to the clinic for diarrhea for around 10 days associated with some blood in the diarrhea. She took part of a Z-pack around the time the diarrhea started. She also notes abdominal pain that is diffuse in nature. The diarrhea occurs multiple times a day >5x daily. It seems to be worse after eating. She denies any vomiting and is able to keep her food down. She denies any fevers or chills.   Of note she has had a around 30 lb weight loss since 2008.  Habits & Providers  Alcohol-Tobacco-Diet     Tobacco Status: never  Current Problems (verified): 1)  Diarrhea, Bloody  (ICD-787.91) 2)  Abdominal Pain Right Upper Quadrant  (ICD-789.01) 3)  Viral Uri  (ICD-465.9) 4)  Menorrhagia  (ICD-626.2) 5)  Contraceptive Management  (ICD-V25.09) 6)  Anemia  (ICD-285.9) 7)  Intertrigo, Candidal  (ICD-695.89) 8)  Dyspepsia  (ICD-536.8) 9)  Candidiasis of Vulva and Vagina  (ICD-112.1) 10)  Vaginal Discharge  (ICD-623.5) 11)  Dysuria  (ICD-788.1) 12)  Diarrhea  (ICD-787.91) 13)  Mental Retardation  (ICD-319) 14)  Asthma, Persistent  (ICD-493.90) 15)  Gerd  (ICD-530.81) 16)  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 Take 1 Tablet By Mouth Four Times A Day 2)  Tegretol Xr 400 Mg Tb12 (Carbamazepine) .... Take 1 Tablet By Mouth Twice A Day 3)  Fexofenadine Hcl 180 Mg Tabs (Fexofenadine Hcl) .... Take 1 Tablet By Mouth Once A Day 4)  Flonase 50 Mcg/act  Susp (Fluticasone Propionate) .... 2 Sprays Into Each Nostril Daily For Allergic Rhinitis 5)  Albuterol 90 Mcg/act Aers (Albuterol) .... 2 Puffs Every 4 Hours As Needed 6)  Singulair 10 Mg  Tabs (Montelukast Sodium) .... One Tablet Daily For Rhinitis 7)  Ipratropium Bromide 0.06 %  Soln (Ipratropium Bromide) .... 2 Sprays Into Each Nostril Every 8-12 Hours As Needed For Runny Nose Due To Allergies 8)  Symbicort 160-4.5 Mcg/act Aero (Budesonide-Formoterol Fumarate) .... 2 Inhalation Bid 9)  Omeprazole 20 Mg Cpdr (Omeprazole) .... One By Mouth Daily 10)  Ibuprofen 600 Mg  Tabs (Ibuprofen) .Marland Kitchen.. 1 Tab By Mouth Q 6 Hours Prn For Pain 11)  Selenium Sulfide 2.5 % Lotn (Selenium Sulfide) .... Use Daily As Directed Disp 1 Bottle 12)  Caltrate 600+d Plus 600-400 Mg-Unit Chew (Calcium Carbonate-Vit D-Min) .Marland Kitchen.. 1 Tablet By Mouth Daily 13)  Nystatin 100000 Unit/gm Crea (Nystatin) .... Apply To Groin Two Times A Day As Needed For  Rash 14)  Omeprazole 40 Mg Cpdr (Omeprazole) .... One Tab in Am, One Tab in The Pm X 2 Weeks, Then One Tab Daily 15)  Depo-Provera 150 Mg/ml Susp (Medroxyprogesterone Acetate) .... Q 3 Months, Per Protocol 16)  Metronidazole 500 Mg Tabs (Metronidazole) .Marland Kitchen.. 1 By Mouth Three Times A Day X 14 Days. Disp Qs  Allergies (verified): 1)  ! Sudafed 2)  ! Sulfa 3)  Tramadol Hcl (Tramadol Hcl)  Past History:  Past Medical History: Last updated: 03/03/2010 MENORRHAGIA (ICD-626.2)    - Depo Provera, started 02/2010 ANEMIA (ICD-285.9) MENTAL RETARDATION (ICD-319) ASTHMA, PERSISTENT, MILD (ICD-493.90) GERD  (ICD-530.81) SOMATIZATION DISORDER (ICD-300.81) ALLERGIC RHINITIS (ICD-477.9)    - Followed by Allergy Specialist EPILEPSY (ICD-780.39)    - Followed by Neurology LIVER FUNCTION TESTS, ABNORMAL (ICD-794.8)    - Followed by GI, Dr. Matthias Hughs OSTEOARTHRITIS, LOWER LEG (ICD-715.96) OBESITY, NOS (ICD-278.00) CARDIAC FLOW MURMUR (ICD-785.2) PES PLANUS (ICD-754.61)  Past Surgical History: Last updated: 03/03/2010 Abd [place] -  mild diffuse fatty infiltrate - 11/08/2003 Baseline peak flows 440 - 05/03/2007 Echo - nl EF, tr TR - 09/10/2001 Flexible Sigmoidoscopy - WNL 11/97, 4/02   Family History: Last updated: 03/03/2010 MGF - SLE, anemia; interstitial lung dz; died at 92yo, mother deceased 2/2 OHS, COPD, hemorrhage 2/2 coumadin tx for DVT/PE. Mom had  DM, HTN, asthma, fibromyalgia, somatization, and morbid  obesity.  Social History: Last updated: 03/03/2010 Pt lives at home alone.  Her Uncle lives next door.  She has a home health aid who visits three hours a day to help with cooking, cleaning.  [name] can also do these things herself.  She used to live with and care for her mother.  Her mother passed away early in 2006/11/04.  POA: [name] [phone]. [name] is okay with direct communication between PCP and POA.   Review of Systems       The patient complains of weight loss, abdominal pain, and hematochezia.  The patient denies anorexia, fever, chest pain, syncope, dyspnea on exertion, headaches, melena, severe indigestion/heartburn, muscle weakness, difficulty walking, and enlarged lymph nodes.    Physical Exam  General:  VS noted.  Well NAD Mouth:  MMM Lungs:  Normal WOB, no wheezing or crackles, good air movement.  Heart:  Normal rate and regular rhythm.  Abdomen:  NABS, No distender.  Pain in URQ with + Murphey sign. No masses no guarding no rebound. Soft Extremities:  warm and well perfused Cervical Nodes:  No lymphadenopathy noted Axillary Nodes:  No palpable  lymphadenopathy Psych:  converses slowley   Impression & Recommendations:  Problem # 1:  DIARRHEA, BLOODY (ICD-787.91) Assessment New Concern for C-diff with bloody diarrhea following ABX exposure.  Plan to obtian C-diff antigen and stool culture today and start oral flagyl three times a day x2 weeks.  Will reassess pt in 1 week or sooner with a follow up appointment.  Pt was able to repeat back the red flags, medication instructions, and follow up plan.  Orders: Comp Met-[facility] [phone]) CBC-[facility] [phone]) Culture, Stool- [facility] [phone]) Stool C-Diff toxin assay- [facility] ([id number]) [facility]- Est  Level 4 (62831)  Problem # 2:  ABDOMINAL PAIN RIGHT UPPER QUADRANT (ICD-789.01) Assessment: New Think is related to diarrhea however location is more consistant with gall bladder etiology.  Will obtain a CMP now for biliruben and lfts.  Will also get URQ ultrasound to assess for gall stones.  Will follow up with PCP. Orders: Ultrasound (Ultrasound) [facility]- Est  Level 4 (51761)  Complete Medication List: 1)  Depakote 250 Mg Tbec (Divalproex sodium) .... Take 1 tablet by mouth four times a day 2)  Tegretol Xr 400 Mg Tb12 (Carbamazepine) .... Take 1 tablet by mouth twice a day 3)  Fexofenadine Hcl 180 Mg Tabs (Fexofenadine hcl) .... Take 1 tablet by mouth once a day 4)  Flonase 50 Mcg/act Susp (Fluticasone propionate) .... 2 sprays into each nostril daily for allergic rhinitis 5)  Albuterol 90 Mcg/act Aers (Albuterol) .... 2 puffs every 4 hours as needed 6)  Singulair 10 Mg Tabs (Montelukast sodium) .... One tablet daily for rhinitis 7)  Ipratropium Bromide 0.06 % Soln (Ipratropium bromide) .... 2 sprays into each nostril every 8-12 hours as needed for runny nose due to allergies 8)  Symbicort 160-4.5 Mcg/act Aero (Budesonide-formoterol fumarate) .... 2 inhalation bid 9)  Omeprazole 20 Mg Cpdr (Omeprazole) .... One by mouth daily 10)  Ibuprofen 600 Mg Tabs (Ibuprofen) .Marland Kitchen.. 1 tab by mouth q 6  hours prn for pain 11)  Selenium Sulfide 2.5 % Lotn (Selenium sulfide) .... Use daily as directed disp 1 bottle 12)  Caltrate 600+d Plus 600-400 Mg-unit Chew (Calcium carbonate-vit d-min) .Marland Kitchen.. 1 tablet by mouth daily 13)  Nystatin 100000 Unit/gm Crea (Nystatin) .... Apply to groin two times a day as needed for rash 14)  Omeprazole 40 Mg Cpdr (Omeprazole) .... One tab in am, one tab in the pm x 2 weeks, then one tab daily 15)  Depo-provera 150 Mg/ml Susp (Medroxyprogesterone acetate) .... Q 3 months, per protocol 16)  Metronidazole 500 Mg Tabs (Metronidazole) .Marland Kitchen.. 1 by mouth three times a day x 14 days. disp qs  Patient Instructions: 1)  Thank you for seeing me today. 2)  Please schedule an appointment with a doctor in 1 week.  3)  If you have chest pain, difficulty breathing, fevers over 102 that does not get better with tylenol please call us or see a doctor.  4)  Come back sooner if you are not feeling better.  5)  Start taking the metronidazole three times a day for 2 weeks.  Prescriptions: METRONIDAZOLE 500 MG TABS (METRONIDAZOLE) 1 by mouth three times a day x 14 days. Disp qs  #1 qs x 0   Entered and Authorized by:   Clementeen Graham MD   Signed by:   Clementeen Graham MD on 04/12/2010   Method used:   Electronically to        [id number] (retail)       [address]       [place], [place]  [address]       Ph: [phone] or [phone]       Fax: [phone]   RxID:   [id number]    Orders Added: 1)  Comp Met-[facility] [[id number]] 2)  CBC-[facility] [[id number]] 3)  Culture, Stool- [facility] [[id number]] 4)  [facility] [[id number][facility] 5)  Ultrasound [Ultrasound] 6)  [facility]- Est  Level 4 [[id number]]

## 2010-06-27 NOTE — Progress Notes (Signed)
Summary: phone note  Phone Note Call from Patient Call back at Home Phone [phone]   Caller: Patient Summary of Call: Pt wants to know what she can use for mosquito bites Initial call taken by: Loralee Pacas CMA,  December 02, 2009 2:36 PM  Follow-up for Phone Call        LM.  will tell her to try wet tea bag or wet baking soda, lime or lemon juice, ice area or vinegar.  NOT to combine any of these. choose one Follow-up by: Golden Circle RN,  December 02, 2009 2:50 PM  Additional Follow-up for Phone Call Additional follow up Details #1::        she has baking soda & will using that. told her to leave it on until it dries. Additional Follow-up by: Golden Circle RN,  December 02, 2009 3:48 PM

## 2010-06-27 NOTE — Progress Notes (Signed)
Summary: triage  Phone Note Call from Patient Call back at Home Phone [phone]   Caller: Patient Summary of Call: Pt having diarrhea.  Stomach pain.  Head light headed. Initial call taken by: Clydell Hakim,  July 11, 2009 3:03 PM  Follow-up for Phone Call        started last wed. she took a laxative & had multiple watery stools. still having watery stool. has been eating crackers & drinking water.  told her to drink something else beside water.ate eggs & toast last night & felt ok. had an apple this am-advised banana may be better.likes grits. told her that would be fine.  told her again-no laxatives. reiterated use of powdered or tablet fiber to keep stool at normal consistancy & regular movements asked that she call in am & let us know how she was feeling. she agreed Follow-up by: Golden Circle RN,  July 11, 2009 3:08 PM

## 2010-06-27 NOTE — Progress Notes (Signed)
Summary: phn msg  Phone Note Call from Patient Call back at Home Phone [phone]   Caller: Patient Summary of Call: needs to talk to nurse about her period started back up again after stopping on Tues- has headache Initial call taken by: De Nurse,  March 30, 2010 3:25 PM  Follow-up for Phone Call        spoke with patient  states period started on 10/24/2011and she states she completely stopped bleeding on tuesday of this week ,03/28/2010. no bleeding yesterday . then today around lunchtime bleeding started again. she is currently on her second pad .  passing some clots. she has not taken any thing for headache but has Ibuprofen 600 mg on hand. will forward message to MD. Follow-up by: Theresia Lo RN,  March 30, 2010 3:49 PM  Additional Follow-up for Phone Call Additional follow up Details #1::         attempted calling patient back  , message left.  Dr. Earlene Plater advises since she is on depo may take some time for bleeding to stop completely. not uncommon to have some irregularity and continued bleeding. will discuss with patient tomorrow. Additional Follow-up by: Theresia Lo RN,  March 30, 2010 4:56 PM    Additional Follow-up for Phone Call Additional follow up Details #2::    spoke with patient and gave message from MD. Follow-up by: Theresia Lo RN,  March 31, 2010 8:41 AM

## 2010-06-27 NOTE — Progress Notes (Signed)
Summary: needs meds  Phone Note Call from Patient Call back at Home Phone [phone]   Caller: Patient Summary of Call: has a nother yeast inf and needs DIFLUCAN 150 MG TABS called to [facility] Initial call taken by: De Nurse,  February 10, 2010 3:35 PM  Follow-up for Phone Call        please ask if this is vaginal yeast - with vaginal discharge or yeast on skin. if vaginal discharge, she needs to be seen because she was negative when we last examined her for the same. if the yeast is on the skin still, i can call in cream instead of pill. Follow-up by: Helane Rima DO,  February 10, 2010 3:37 PM  Additional Follow-up for Phone Call Additional follow up Details #1::        states she has it on her legs & between her her legs. asked her to come in & be seen. she has to see about getting a ride. she will call monday to make the appt Additional Follow-up by: Golden Circle RN,  February 10, 2010 4:57 PM

## 2010-06-27 NOTE — Progress Notes (Signed)
Summary: Triage  Phone Note Call from Patient Call back at Home Phone [phone]   Reason for Call: Talk to Nurse Summary of Call: has had HA & now feeling lightheaded Initial call taken by: Knox Royalty,  October 26, 2009 11:53 AM  Follow-up for Phone Call        started yesterday . has not taken anything. told her to take her ibu and meclizine. lay down in cool room. eat before taking the ibu. had gone to a funeral yesterday. she agreed to try the plan & will cALL BEFORE WE CLOSE IF NOT IMPROVED Follow-up by: Golden Circle RN,  October 26, 2009 11:56 AM

## 2010-06-27 NOTE — Miscellaneous (Signed)
Summary: assorted complaints  Clinical Lists Changes c/o cp, weakness, "falling backwards, coughing & wheezing"  has been taking all meds except ibu. suggested she take this for the CP. it was ordered at last OV for this complaint. she will use a hot water bottle to R side of chest where it hurts. states she will be walking & then feels like she is going to fall backwards but catches herself. has been seen here for the cp. takes TUMs in addition to her ppi two times a day. c/o congestion. no diarrhea. no fever that she knows of. states she lost the thermometer.   had nosebleeds 4 days ago.  unable to get a ride in today. suggested she get a ride to [facility] tonight or over the weekend. she agreed.Marland KitchenMarland KitchenGolden Circle RN  August 19, 2009 4:25 PM

## 2010-06-27 NOTE — Progress Notes (Signed)
Summary: PLEASE SEE MESSAGE/WAIT.FOR PT TO CALL BACK/TS  Phone Note Call from Patient Call back at Home Phone [phone]   Caller: Patient Summary of Call: pls fax to [facility] stating that she is on OMEPRAZOLE 40 MG CPDR 2 tabs by mouth two times a day- pls fax to nurse Ines Bloomer at [phone]  also - the rx that was faxed is not correct needs to be 40 mg - pls call into [facility] [address]  Initial call taken by: De Nurse,  February 27, 2010 4:51 PM  Follow-up for Phone Call        fwd. to Dr.Rayden Dock to clarify dose.  Follow-up by: Arlyss Repress CMA,,  February 27, 2010 5:16 PM  Additional Follow-up for Phone Call Additional follow up Details #1::        this was prescribed by her allergy doctor. see last rx request wanting dosage changed to 20. please ask patient to call allergy doctor for medication. Additional Follow-up by: Helane Rima DO,  February 27, 2010 5:46 PM    Additional Follow-up for Phone Call Additional follow up Details #2::    called pt at her number. unable to leave message. waiting for pt to call back. Follow-up by: Arlyss Repress CMA,,  February 28, 2010 12:25 PM  Additional Follow-up for Phone Call Additional follow up Details #3:: Details for Additional Follow-up Action Taken: called pt. pt states 'I have been put on omeprazole by allergist 80mg  two times a day. Now, they want to decrease the Omeprazole'. I explained to the pt several times, that the Allergist has to change the direction of the Rx, not [place], since it was prescribed by him and also changed by him. We originally have pt on Omeprazole.  New/Updated Medications: OMEPRAZOLE 20 MG CPDR (OMEPRAZOLE) one by mouth daily OMEPRAZOLE 40 MG CPDR (OMEPRAZOLE) one tab in am, one tab in the pm x 2 weeks, then one tab daily Prescriptions: OMEPRAZOLE 40 MG CPDR (OMEPRAZOLE) one tab in am, one tab in the pm x 2 weeks, then one tab daily  #60 x 3   Entered and Authorized by:   Helane Rima DO  Signed by:   Helane Rima DO on 02/28/2010   Method used:   Electronically to        [facility]  [address] [id number]* (retail)       [address]       [place], [place]  [address]       Ph: [phone] or [phone]       Fax: [phone]   RxID:   [id number]  Called patient. Clarified dose. Sent to pharmacy. Helane Rima DO  February 28, 2010 5:51 PM

## 2010-06-27 NOTE — Progress Notes (Signed)
Summary: triage  Phone Note Call from Patient Call back at Home Phone [phone]   Caller: Patient Summary of Call: pt not feeling well wheezing. Initial call taken by: Clydell Hakim,  Oct 04, 2009 4:24 PM  Follow-up for Phone Call        started last thursday after her allergy injection. took benadryl which worked a little. states chest was tight.  mouth was dry & she felt bad. The allergist was notified. she was seen & got prednisone & changed to zyrtec. this is the 2nd bad reax to an allergy shot.  nurse at day care thinks she may have had a panic attack. she wants appt here first before she sees allergist again  cannot come before thursday due to transportation issues. appt at 10:30 with Dr. Sharen Hones. told her if she has any more wheezing, chest tightness or cannot breathe well or swallow, call 911 & go to ED Follow-up by: Golden Circle RN,  Oct 04, 2009 4:26 PM

## 2010-06-27 NOTE — Letter (Signed)
Summary: FL-2 Form  FL-2 Form   Imported By: Clydell Hakim 12/29/2009 11:15:55  _____________________________________________________________________  External Attachment:    Type:   Image     Comment:   External Document

## 2010-06-27 NOTE — Consult Note (Signed)
Summary: [facility]  [facility]   Imported By: Clydell Hakim 11/11/2009 15:59:29  _____________________________________________________________________  External Attachment:    Type:   Image     Comment:   External Document

## 2010-06-27 NOTE — Progress Notes (Signed)
Summary: triage  Phone Note Call from Patient Call back at Home Phone [phone]   Caller: Patient Summary of Call: Pts stomach is upset. Initial call taken by: Clydell Hakim,  January 02, 2010 10:15 AM  Follow-up for Phone Call        diarrhea started thursday pm. it has started to stop. still has cramps. has been drinking lots of juice. told her to stop the juice & drink water. advised getting immodium & told her how to take it.  she agreed with plan. will not have a way to get to store until tomorrow Follow-up by: Golden Circle RN,  January 02, 2010 10:26 AM

## 2010-06-27 NOTE — Assessment & Plan Note (Signed)
Summary: stomach pain,df(resch'd from 8/31/bmc   Vital Signs:  Patient profile:   35 year old female Height:      61 inches Weight:      146 pounds BMI:     27.69 Temp:     97.8 degrees F oral Pulse rate:   90 / minute BP sitting:   120 / 76  (left arm) Cuff size:   regular  Vitals Entered By: Tessie Fass CMA (January 26, 2010 10:11 AM) CC: abdominal pain Pain Assessment Patient in pain? yes     Location: abdomen Intensity: 7   Primary Care Provider:  Helane Rima DO  CC:  abdominal pain.  History of Present Illness: 35 yo F:  1. Dysuria: intermittent, with some mild suprapubic pain, no back pain, no N/V/D/C, + vaginal discharge and itch. Tmax yesterday 98.9. "these are fevers for me." patient taking prednisone. had a URI earlier in the month and was Rx Ceftin x 10 days.  Current Medications (verified): 1)  Depakote 250 Mg Tbec (Divalproex Sodium) .... Take 1 Tablet By Mouth Four Times A Day 2)  Tegretol Xr 400 Mg Tb12 (Carbamazepine) .... Take 1 Tablet By Mouth Twice A Day 3)  Fexofenadine Hcl 180 Mg Tabs (Fexofenadine Hcl) .... Take 1 Tablet By Mouth Once A Day 4)  Flonase 50 Mcg/act  Susp (Fluticasone Propionate) .... 2 Sprays Into Each Nostril Daily For Allergic Rhinitis 5)  Albuterol 90 Mcg/act Aers (Albuterol) .... 2 Puffs Every 4 Hours As Needed 6)  Singulair 10 Mg  Tabs (Montelukast Sodium) .... One Tablet Daily For Rhinitis 7)  Ipratropium Bromide 0.06 %  Soln (Ipratropium Bromide) .... 2 Sprays Into Each Nostril Every 8-12 Hours As Needed For Runny Nose Due To Allergies 8)  Symbicort 160-4.5 Mcg/act Aero (Budesonide-Formoterol Fumarate) .... 2 Inhalation Bid 9)  Omeprazole 40 Mg Cpdr (Omeprazole) .... 2 Tabs By Mouth Bid 10)  Desipramine Hcl 25 Mg Tabs (Desipramine Hcl) .Marland Kitchen.. 1 Tab By Mouth At Bedtime. 11)  Ibuprofen 600 Mg  Tabs (Ibuprofen) .Marland Kitchen.. 1 Tab By Mouth Q 6 Hours Prn For Pain 12)  Selenium Sulfide 2.5 % Lotn (Selenium Sulfide) .... Use Daily As  Directed Disp 1 Bottle 13)  Caltrate 600+d Plus 600-400 Mg-Unit Chew (Calcium Carbonate-Vit D-Min) .Marland Kitchen.. 1 Tablet By Mouth Daily 14)  Diflucan 150 Mg Tabs (Fluconazole) .... Once Daily  Allergies (verified): 1)  ! Sudafed 2)  ! Sulfa 3)  Tramadol Hcl (Tramadol Hcl) PMH-FH-SH reviewed for relevance  Review of Systems      See HPI  Physical Exam  General:  Overweight, alert, well-groomed, NAD. Vitals reviewed.  Abdomen:  Soft, normal bowel sounds, no distention, no masses, no guarding, no rebound tenderness, epigastric tenderness, suprapubic, and RLQ tenderness.  No CVA tenderness. Genitalia:  Normal introitus, no external lesions, mucosa pink and moist, and no vaginal or cervical lesions.  Mild erythema with few satellite lesions skin vulva. Psych:  Oriented X3, memory intact for recent and remote, normally interactive, good eye contact, not anxious appearing, and not depressed appearing.     Impression & Recommendations:  Problem # 1:  DYSURIA (ICD-788.1) Assessment New Negative. Encouraged patient to drink water. Offered Pyridium but patient declined. Orders: Urinalysis-[facility] (00000) Basic Met-[facility] [phone]) [facility]- Est  Level 4 (33295)  Problem # 2:  VAGINAL DISCHARGE (ICD-623.5) Assessment: New Negative. Explained normal cervical discharge. Orders: Wet Prep- [facility] (87210) [facility]- Est  Level 4 (99214)  Problem # 3:  CANDIDIASIS OF VULVA AND VAGINA (ICD-112.1) Assessment:  New  Her updated medication list for this problem includes:    Diflucan 150 Mg Tabs (Fluconazole) ..... Once daily  Orders: [facility]- Est  Level 4 (99214)  Problem # 4:  DYSPEPSIA (ICD-536.8) Assessment: Deteriorated  Noted epigastric ttp on exam. Further questioning revealed sour tast in mouth. No melena. Continue Omeprazole. Hadout given for behavioral modificationto decrease GERD symptoms.  Orders: [facility]- Est  Level 4 (99214)  Complete Medication List: 1)  Depakote 250 Mg Tbec (Divalproex sodium) ....  Take 1 tablet by mouth four times a day 2)  Tegretol Xr 400 Mg Tb12 (Carbamazepine) .... Take 1 tablet by mouth twice a day 3)  Fexofenadine Hcl 180 Mg Tabs (Fexofenadine hcl) .... Take 1 tablet by mouth once a day 4)  Flonase 50 Mcg/act Susp (Fluticasone propionate) .... 2 sprays into each nostril daily for allergic rhinitis 5)  Albuterol 90 Mcg/act Aers (Albuterol) .... 2 puffs every 4 hours as needed 6)  Singulair 10 Mg Tabs (Montelukast sodium) .... One tablet daily for rhinitis 7)  Ipratropium Bromide 0.06 % Soln (Ipratropium bromide) .... 2 sprays into each nostril every 8-12 hours as needed for runny nose due to allergies 8)  Symbicort 160-4.5 Mcg/act Aero (Budesonide-formoterol fumarate) .... 2 inhalation bid 9)  Omeprazole 40 Mg Cpdr (Omeprazole) .... 2 tabs by mouth bid 10)  Desipramine Hcl 25 Mg Tabs (Desipramine hcl) .Marland Kitchen.. 1 tab by mouth at bedtime. 11)  Ibuprofen 600 Mg Tabs (Ibuprofen) .Marland Kitchen.. 1 tab by mouth q 6 hours prn for pain 12)  Selenium Sulfide 2.5 % Lotn (Selenium sulfide) .... Use daily as directed disp 1 bottle 13)  Caltrate 600+d Plus 600-400 Mg-unit Chew (Calcium carbonate-vit d-min) .Marland Kitchen.. 1 tablet by mouth daily 14)  Diflucan 150 Mg Tabs (Fluconazole) .... Once daily Prescriptions: DIFLUCAN 150 MG TABS (FLUCONAZOLE) once daily  #1 x 0   Entered and Authorized by:   Helane Rima DO   Signed by:   Helane Rima DO on 01/26/2010   Method used:   Electronically to        [id number] (retail)       [address]       [place], [place]  [address]       Ph: [phone] or [phone]       Fax: [phone]   RxID:   [id number]   Laboratory Results   Urine Tests  Date/Time Received: January 26, 2010 11:13 AM  Date/Time Reported: January 26, 2010 11:23 AM   Routine Urinalysis   Color: amber Appearance: Clear Glucose: negative   (Normal Range: Negative) Bilirubin: moderate-icto Positive   (Normal Range:  Negative) Ketone: negative   (Normal Range: Negative) Spec. Gravity: 1.025   (Normal Range: 1.003-1.035) Blood: negative   (Normal Range: Negative) pH: 6.5   (Normal Range: 5.0-8.0) Protein: negative   (Normal Range: Negative) Urobilinogen: 2.0   (Normal Range: 0-1) Nitrite: negative   (Normal Range: Negative) Leukocyte Esterace: negative   (Normal Range: Negative)    Comments: ...........test performed by...........Marland KitchenTerese Door, CMA  Date/Time Received: January 26, 2010 11:13 AM  Date/Time Reported: January 26, 2010 11:23 AM   [place] Source: vaginal WBC/hpf: 0-3 Bacteria/hpf: 1+  Rods Clue cells/hpf: none  Negative whiff Yeast/hpf: none Trichomonas/hpf: none Comments: ...........test performed by...........Marland KitchenTerese Door, CMA

## 2010-06-27 NOTE — Consult Note (Signed)
Summary: [facility]  [facility]   Imported By: Knox Royalty 05/05/2010 12:22:30  _____________________________________________________________________  External Attachment:    Type:   Image     Comment:   External Document

## 2010-06-27 NOTE — Progress Notes (Signed)
Summary: needs rx  Phone Note Call from Patient Call back at Home Phone [phone]   Caller: Patient Reason for Call: Insurance Question Summary of Call: needs a rx sent to [facility] for a shower handle  fax [phone] Initial call taken by: De Nurse,  December 29, 2009 4:31 PM  Follow-up for Phone Call        Entered as Home Health Referral. Please fax. Follow-up by: Helane Rima DO,  December 31, 2009 6:21 PM  Additional Follow-up for Phone Call Additional follow up Details #1::        faxed referral to [facility] Additional Follow-up by: Tessie Fass CMA,  January 02, 2010 11:31 AM

## 2010-06-27 NOTE — Assessment & Plan Note (Signed)
Summary: f/u before MD is on maternity lv/eo   Vital Signs:  Patient profile:   35 year old female Height:      61 inches Weight:      157 pounds BMI:     29.77 Temp:     97.8 degrees F oral Pulse rate:   86 / minute BP sitting:   114 / 71  (left arm) Cuff size:   regular  Vitals Entered By: Tessie Fass, CMA CC: F/U Is Patient Diabetic? No Pain Assessment Patient in pain? no        Primary Care Provider:  Ardeen Garland  MD  CC:  F/U.  History of Present Illness: [name] comes in today for f/u cold and asthma symptoms.  she saw her allergist, Dr. Willa Rough, last week who put her back on flonase and her nasal congestion and cough have seemed to improve.  She did use some dayquil and nyquil over the weekend but not since SUnday.  Just still having some body aches but otherwise feeling well.  No fevers.  No other concerns today.   Using ibuprofen 600 at night for the body aches and seems to be working.  No difficulty breathing.    Habits & Providers  Alcohol-Tobacco-Diet     Tobacco Status: never  Allergies: 1)  ! Sudafed 2)  ! Sulfa 3)  Tramadol Hcl (Tramadol Hcl)  Physical Exam  General:  overweight, alert, well-groomed, NAD vitals reviewed.  Eyes:  conjunctiva clear and moist, no injection Ears:  bilateral TMs normal Mouth:  oropharynx clear Lungs:  normal WOB, no wheezing or crackles, good air movement.  Heart:  Normal rate and regular rhythm. S1 and S2 normal without gallop, murmur, click, rub or other extra sounds. Cervical Nodes:  No lymphadenopathy noted   Impression & Recommendations:  Problem # 1:  VIRAL URI (ICD-465.9) Assessment New  Doing well, especially since restarting flonase.  May have been more due to allergic rhinitis.  Still some body aches from all the coughin she was doing but improving well.  No treatment needed today.  Follow-up as needed.  Her updated medication list for this problem includes:    Fexofenadine Hcl 180 Mg Tabs (Fexofenadine  hcl) .Marland Kitchen... Take 1 tablet by mouth once a day    Ibuprofen 600 Mg Tabs (Ibuprofen) .Marland Kitchen... 1 tab by mouth q 6 hours prn for pain  Orders: [facility]- Est Level  3 (81191)  Complete Medication List: 1)  Depakote 250 Mg Tbec (Divalproex sodium) .... Take 1 tablet by mouth four times a day 2)  Fexofenadine Hcl 180 Mg Tabs (Fexofenadine hcl) .... Take 1 tablet by mouth once a day 3)  Flonase 50 Mcg/act Susp (Fluticasone propionate) .... 2 sprays into each nostril daily for allergic rhinitis 4)  Tegretol Xr 400 Mg Tb12 (Carbamazepine) .... Take 1 tablet by mouth twice a day 5)  Albuterol 90 Mcg/act Aers (Albuterol) .... 2 puffs every 4 hours as needed 6)  Selenos 2.25 % Sham (Selenium sulf-pyrithione-urea) .... Shampoo daily.  do not use any other shampoo. 7)  Singulair 10 Mg Tabs (Montelukast sodium) .... One tablet daily for rhinitis 8)  Ipratropium Bromide 0.06 % Soln (Ipratropium bromide) .... 2 sprays into each nostril every 8-12 hours as needed for runny nose due to allergies 9)  Symbicort 160-4.5 Mcg/act Aero (Budesonide-formoterol fumarate) .... 2 inhalation bid 10)  Ibuprofen 600 Mg Tabs (Ibuprofen) .Marland Kitchen.. 1 tab by mouth q 6 hours prn for pain 11)  Omeprazole 40 Mg Cpdr (  Omeprazole) .... 2 tabs by mouth bid 12)  Desipramine Hcl 25 Mg Tabs (Desipramine hcl) .Marland Kitchen.. 1 tab by mouth at bedtime. 13)  Caltrate 600+d Plus 600-400 Mg-unit Chew (Calcium carbonate-vit d-min) .Marland Kitchen.. 1 tablet by mouth daily 14)  Ondansetron 4 Mg Tbdp (Ondansetron) .Marland Kitchen.. 1 tab by mouth q 6 hrs as needed nausea

## 2010-06-27 NOTE — Progress Notes (Signed)
Summary: triage  Phone Note Call from Patient Call back at Home Phone [phone]   Caller: Patient Summary of Call: Pt got hot feeling yesterday and weak and head started hurting. Initial call taken by: Clydell Hakim,  March 09, 2010 3:38 PM  Follow-up for Phone Call        states she feels weak & "out of it"  took ibuprofen for her HA & leg aches. felt a little better today.  no HA now. legs ache off & on. fingers go numb off & on. just feels "out of it" states she talked to Dr. Matthias Hughs last monday & he told her liver was high. he told her a lot of her problems comes from "my gallbladder disease" states she does eat meat & greens.  brings her own food to the center & avoids gravy & salt. states eating out makes her feel bad.  she wanted to know what to do. told her to call her [facility] and I will send this to her pcp. I or md will call her tomorrow. wanted to know if she can go bowling tomorrow. told her yes. Follow-up by: Golden Circle RN,  March 09, 2010 3:54 PM

## 2010-06-27 NOTE — Miscellaneous (Signed)
Summary: shampoo changes  Clinical Lists Changes [facility] sent a refill request for her shampoo. they do not make the one ordered but do have it in 2.5% lotion. form to pcp chart box.Golden Circle RN  August 17, 2009 3:02 PM

## 2010-06-27 NOTE — Miscellaneous (Signed)
Summary: Asthma Update - Persistent      New Problems: ASTHMA, PERSISTENT (ICD-493.90)   New Problems: ASTHMA, PERSISTENT (ICD-493.90) 

## 2010-06-27 NOTE — Consult Note (Signed)
Summary: [facility]   Imported By: Clydell Hakim 09/15/2009 10:59:09  _____________________________________________________________________  External Attachment:    Type:   Image     Comment:   External Document

## 2010-06-27 NOTE — Assessment & Plan Note (Signed)
Summary: loose bowels,df   Vital Signs:  Patient profile:   35 year old female Height:      61 inches Weight:      155 pounds BMI:     29.39 Temp:     98.8 degrees F oral Pulse rate:   101 / minute BP sitting:   110 / 73  (left arm) Cuff size:   regular  Vitals Entered ByTessie Fass (November 08, 2009 10:41 AM) CC: headache and dizziness. diarrhea x 2 weeks Is Patient Diabetic? No Pain Assessment Patient in pain? no        Primary Care Provider:  Ardeen Garland  MD  CC:  headache and dizziness. diarrhea x 2 weeks.  History of Present Illness: [name] comes in today complaining of headache and stomach upset since "the first of the month".   1) Headache - mostly on her left side.  off an on.  just "hurts".  Can't characterize further.  Sometimes feels a little "woozy" but otherwise no other associated symptoms.  Feels better when she takes ibuprofen, which she has done twice, but then that made her stomach hurt.  Still taking all her allergy medicines.  A  little better today. 2) Stomach upset - hurts around belly button and below belly button some.  Has loose stools on Saturday but more formed now.  No vomitting but some occassional nausea.  Seems to be getting better.  Sometimes burns when she urinates.  No fevers.   Allergies: 1)  ! Sudafed 2)  ! Sulfa 3)  Tramadol Hcl (Tramadol Hcl)  Physical Exam  General:  overweight, alert, well-groomed, NAD vitals reviewed.  Lungs:  normal WOB, no wheezing or crackles, good air movement.  Heart:  Normal rate and regular rhythm. S1 and S2 normal without gallop, murmur, click, rub or other extra sounds. Abdomen:  mildly TTP suprapubically.  No RUQ or RLQ tendeness Pulses:  2+ radial and dp pulses Extremities:  no edema Neurologic:  alert & oriented X3 and cranial nerves II-XII intact.     Impression & Recommendations:  Problem # 1:  HEADACHE (ICD-784.0) Assessment New  NO red flags.  encouraged rest, cool rag, taking her allergy  medicines.  Ibuprofen, but with food as needed.   Her updated medication list for this problem includes:    Ibuprofen 600 Mg Tabs (Ibuprofen) .Marland Kitchen... 1 tab by mouth q 6 hours prn for pain  Orders: [facility]- Est Level  3 ([address])  Problem # 2:  ABDOMINAL PAIN OTHER SPECIFIED SITE (ICD-789.09) Assessment: New Made worse by the ibuprofen.  Take it with food.  Seems to be improving.  UA negative for infection. No red flags.  Her updated medication list for this problem includes:    Ibuprofen 600 Mg Tabs (Ibuprofen) .Marland Kitchen... 1 tab by mouth q 6 hours prn for pain  Orders: Urinalysis-[facility] (00000) [facility]- Est Level  3 (60454)  Complete Medication List: 1)  Depakote 250 Mg Tbec (Divalproex sodium) .... Take 1 tablet by mouth four times a day 2)  Tegretol Xr 400 Mg Tb12 (Carbamazepine) .... Take 1 tablet by mouth twice a day 3)  Fexofenadine Hcl 180 Mg Tabs (Fexofenadine hcl) .... Take 1 tablet by mouth once a day 4)  Flonase 50 Mcg/act Susp (Fluticasone propionate) .... 2 sprays into each nostril daily for allergic rhinitis 5)  Albuterol 90 Mcg/act Aers (Albuterol) .... 2 puffs every 4 hours as needed 6)  Singulair 10 Mg Tabs (Montelukast sodium) .... One tablet daily for  rhinitis 7)  Ipratropium Bromide 0.06 % Soln (Ipratropium bromide) .... 2 sprays into each nostril every 8-12 hours as needed for runny nose due to allergies 8)  Symbicort 160-4.5 Mcg/act Aero (Budesonide-formoterol fumarate) .... 2 inhalation bid 9)  Omeprazole 40 Mg Cpdr (Omeprazole) .... 2 tabs by mouth bid 10)  Desipramine Hcl 25 Mg Tabs (Desipramine hcl) .Marland Kitchen.. 1 tab by mouth at bedtime. 11)  Ibuprofen 600 Mg Tabs (Ibuprofen) .Marland Kitchen.. 1 tab by mouth q 6 hours prn for pain 12)  Selenium Sulfide 2.5 % Lotn (Selenium sulfide) .... Use daily as directed disp 1 bottle 13)  Caltrate 600+d Plus 600-400 Mg-unit Chew (Calcium carbonate-vit d-min) .Marland Kitchen.. 1 tablet by mouth daily  Laboratory Results   Urine Tests  Date/Time Received: November 08, 2009  11:30 AM  Date/Time Reported: November 08, 2009 11:47 AM   Routine Urinalysis   Color: amber Appearance: Clear Glucose: negative   (Normal Range: Negative) Bilirubin: moderate-icto positive   (Normal Range: Negative) Ketone: negative   (Normal Range: Negative) Spec. Gravity: 1.020   (Normal Range: 1.003-1.035) Blood: negative   (Normal Range: Negative) pH: 6.0   (Normal Range: 5.0-8.0) Protein: negative   (Normal Range: Negative) Urobilinogen: 1.0   (Normal Range: 0-1) Nitrite: negative   (Normal Range: Negative) Leukocyte Esterace: negative   (Normal Range: Negative)    Comments: ...........test performed by...........Marland KitchenTerese Door, CMA

## 2010-06-27 NOTE — Progress Notes (Signed)
Summary: triage  Phone Note Call from Patient Call back at Home Phone [phone]   Caller: Patient Summary of Call: pt not feeling well, thinks it's coming from abx - would not elaborate Initial call taken by: De Nurse,  April 25, 2010 8:39 AM  Follow-up for Phone Call        Has noticed since starting the z pac that she is waking up with stifness in her neck and shoulders.  Also has expereinced a couple of episodes of what she describes as "seizures" in the evening while watching TV.  Says she "falls  asleep with her eyes open".  Was wondering if it was related to the antibiotic and if she should continue taking them.  Told her I would pass this informaiton along to Dr. Earlene Plater and would call her back.  Pt has an appt tomorrow at 1:30.    Follow-up by: Dennison Nancy RN,  April 25, 2010 9:48 AM  Additional Follow-up for Phone Call Additional follow up Details #1::        patient should keep appointment tomorrow. her allergist may have prescribed the zpack, i don't think that we gave that to her. she may stop taking them, but needs to let her allergist know. description not consistent with seizure. Additional Follow-up by: Helane Rima DO,  April 25, 2010 10:02 AM    Additional Follow-up for Phone Call Additional follow up Details #2::    Returned call and relayed message. Follow-up by: Dennison Nancy RN,  April 25, 2010 10:18 AM

## 2010-06-27 NOTE — Progress Notes (Signed)
Summary: FL2 prob  Phone Note From Other Clinic Call back at [phone]   Caller: Delorise Jackson- GC SS Summary of Call: need to look at [facility] - not filled out properly so she would be qualified for problem needs corrected - box 11 - needs to be domicilliary also needs to be signed and dated placed back in [place] box Initial call taken by: De Nurse,  December 26, 2009 12:19 PM  Follow-up for Phone Call        Done. Follow-up by: Helane Rima DO,  December 28, 2009 8:21 AM

## 2010-06-27 NOTE — Assessment & Plan Note (Signed)
Summary: f/u,df   Vital Signs:  Patient profile:   35 year old female Height:      61 inches Weight:      156 pounds BMI:     29.58 BSA:     1.70 Temp:     97.8 degrees F Pulse rate:   90 / minute BP sitting:   110 / 78  Vitals Entered By: Delora Fuel (August 04, 2009 4:11 PM) CC: f/u Is Patient Diabetic? No Pain Assessment Patient in pain? yes     Location: right side of chest and arm Intensity: 9   Primary Care Provider:  Ardeen Garland  MD  CC:  f/u.  History of Present Illness: [name] comes in to discuss several issues: 1) diarrhea - loose stools off and on for one month.  Describes them as watery.  Finds vegetables make it worse and fruit makes it better.  Trying to eat more grain.  Some cramping with the diarrhea.  No fever.  No vomitting or nausea. 2) Chest pain - associated with stomach pain.  Got better last week but came back this morning.  Seems to be right sided.  Sharp pain.  Has not tried anything for it.  No shortness of breath.  3) Right hip pain - started last week.  Feels stiff in her muscles.  Pain sometimes sharp.  No ibuprofen.  Tried putting heat on it and that eased it up some.  No increase in activity.   Habits & Providers  Alcohol-Tobacco-Diet     Tobacco Status: never  Allergies: 1)  ! Sudafed 2)  ! Sulfa 3)  Tramadol Hcl (Tramadol Hcl)  Physical Exam  General:  overweight, alert, well-groomed, NAD vitals reviewed.  Chest Wall:  pain with palpation of costochondral junction on right anterior chest Lungs:  normal WOB, no wheezing or crackles, good air movement.  Heart:  Normal rate and regular rhythm. S1 and S2 normal without gallop, murmur, click, rub or other extra sounds. Abdomen:  soft.  mild tenderness to palpation in epigastrium Extremities:  mild TTP over greater trochanter.  FROM.   Impression & Recommendations:  Problem # 1:  DIARRHEA (ICD-787.91) Assessment New  Off and on.  Seems related to diet per her report.   Advised avoiding foods that seem to exacerbate it and to add fiber to diet.   Orders: [facility]- Est  Level 4 (73220)  Problem # 2:  OTHER CHEST PAIN (URK-270.62) Assessment: New  Pain reproducible.  No red flags.  Appears to be MSK.  10 days ibuprofen and heat/ice.   Orders: [facility]- Est  Level 4 (37628)  Problem # 3:  HIP PAIN, RIGHT (ICD-719.45) Assessment: New  MIld.  Ibuprofen for 10 days, heat/ice, and exercises.  If does not improve could consider injection if patient willing.  Her updated medication list for this problem includes:    Ibuprofen 600 Mg Tabs (Ibuprofen) .Marland Kitchen... 1 tab by mouth q 6 hours prn for pain  Orders: [facility]- Est  Level 4 (31517)  Problem # 4:  GERD (ICD-530.81) Assessment: Deteriorated  Continue omeprazole and take tums as needed when needed.  Her updated medication list for this problem includes:    Omeprazole 40 Mg Cpdr (Omeprazole) .Marland Kitchen... 2 tabs by mouth bid  Orders: [facility]- Est  Level 4 (61607)  Complete Medication List: 1)  Depakote 250 Mg Tbec (Divalproex sodium) .... Take 1 tablet by mouth four times a day 2)  Fexofenadine Hcl 180 Mg Tabs (Fexofenadine hcl) .... Take 1  tablet by mouth once a day 3)  Flonase 50 Mcg/act Susp (Fluticasone propionate) .... 2 sprays into each nostril daily for allergic rhinitis 4)  Tegretol Xr 400 Mg Tb12 (Carbamazepine) .... Take 1 tablet by mouth twice a day 5)  Albuterol 90 Mcg/act Aers (Albuterol) .... 2 puffs every 4 hours as needed 6)  Selenos 2.25 % Sham (Selenium sulf-pyrithione-urea) .... Shampoo daily.  do not use any other shampoo. 7)  Singulair 10 Mg Tabs (Montelukast sodium) .... One tablet daily for rhinitis 8)  Ipratropium Bromide 0.06 % Soln (Ipratropium bromide) .... 2 sprays into each nostril every 8-12 hours as needed for runny nose due to allergies 9)  Symbicort 160-4.5 Mcg/act Aero (Budesonide-formoterol fumarate) .... 2 inhalation bid 10)  Ibuprofen 600 Mg Tabs (Ibuprofen) .Marland Kitchen.. 1 tab by mouth q 6 hours prn  for pain 11)  Omeprazole 40 Mg Cpdr (Omeprazole) .... 2 tabs by mouth bid 12)  Desipramine Hcl 25 Mg Tabs (Desipramine hcl) .Marland Kitchen.. 1 tab by mouth at bedtime. 13)  Caltrate 600+d Plus 600-400 Mg-unit Chew (Calcium carbonate-vit d-min) .Marland Kitchen.. 1 tablet by mouth daily 14)  Ondansetron 4 Mg Tbdp (Ondansetron) .Marland Kitchen.. 1 tab by mouth q 6 hrs as needed nausea  Patient Instructions: 1)  Take the ibuprofen as directed for 10 days.  Try to take it with food. 2)  Be sure to still take your omeprazole.  I have given you a new prescription for it in case you are out.  You can also take TUMS if you need a little extra help for stomach pain once in awhile.  3)  Do the exercises for your hip 1-2 times a day.  Continue to put heat on your hip.  Do that at least 1-2 times a day. 4)  To help with your diarrhea, buy fiber at the drug store.  YOu can get teh powder or the pills.  Take them as directed on the package everyday.  This will help regulate your bowels.  5)  If you aren't doing better in 10 days, please return.  Prescriptions: OMEPRAZOLE 40 MG CPDR (OMEPRAZOLE) 2 tabs by mouth BID  #120 x 1   Entered and Authorized by:   Ardeen Garland  MD   Signed by:   Ardeen Garland  MD on 08/04/2009   Method used:   Print then Give to Patient   RxID:   [id number] IBUPROFEN 600 MG  TABS (IBUPROFEN) 1 tab by mouth q 6 hours prn for pain  #60 x 1   Entered and Authorized by:   Ardeen Garland  MD   Signed by:   Ardeen Garland  MD on 08/04/2009   Method used:   Print then Give to Patient   RxID:   [id number]

## 2010-06-27 NOTE — Miscellaneous (Signed)
Summary: Outside Labs  Clinical Lists Changes  Observations: Added new observation of INR: 1.10  (09/09/2009 9:35) Added new observation of PT PATIENT: 13.7 s (09/09/2009 9:35) Added new observation of PLATELETK/UL: 147 K/uL (09/09/2009 9:35) Added new observation of MCV: 89.2 fL (09/09/2009 9:35) Added new observation of HCT: 29.5 % (09/09/2009 9:35) Added new observation of HGB: 9.9 g/dL (78/29/5621 3:08) Added new observation of WBC COUNT: 4.4 10*3/microliter (09/09/2009 9:35) Added new observation of ALBUMIN: 3.0 g/dL (65/78/4696 2:95) Added new observation of PROTEIN, TOT: 7.9 g/dL (28/41/3244 0:10) Added new observation of SGPT (ALT): 63 units/L (09/09/2009 9:35) Added new observation of SGOT (AST): 88 units/L (09/09/2009 9:35) Added new observation of ALK PHOS: 439 units/L (09/09/2009 9:35) Added new observation of BILI INDIREC: 0.7 mg/dL (27/25/3664 4:03) Added new observation of BILI DIRECT: 1.3 mg/dL (47/42/5956 3:87) Added new observation of BILI TOTAL: 2.0 mg/dL (56/43/3295 1:88)

## 2010-06-27 NOTE — Assessment & Plan Note (Signed)
Summary: congestion,df   Vital Signs:  Patient profile:   35 year old female Height:      61 inches Weight:      146 pounds BMI:     27.69 Temp:     98.0 degrees F oral Pulse rate:   82 / minute BP sitting:   120 / 79  (left arm) Cuff size:   regular  Vitals Entered By: Tessie Fass CMA (March 28, 2010 2:29 PM) CC: cough and congestion x 4 days   Primary Care Provider:  Helane Rima DO  CC:  cough and congestion x 4 days.  History of Present Illness: 35 yo F:  1. URI: Cough, dry, with chest congestion x 4 days, associated with intermittent generalized HA, dry nose, left ear pressure, small amount of diarrhea today. Denies vision changes, sore throat, runny nose, CP,SOB, N/V, fever. She endorses sick contacts. + FluVax. Hx of severe allergies - followed by Allergist.  2. Menses: Now s/p Depo. Had menese 10/24 - 10/31. States that she always has above s/s when she is on her period.  Current Medications (verified): 1)  Depakote 250 Mg Tbec (Divalproex Sodium) .... Take 1 Tablet By Mouth Four Times A Day 2)  Tegretol Xr 400 Mg Tb12 (Carbamazepine) .... Take 1 Tablet By Mouth Twice A Day 3)  Fexofenadine Hcl 180 Mg Tabs (Fexofenadine Hcl) .... Take 1 Tablet By Mouth Once A Day 4)  Flonase 50 Mcg/act  Susp (Fluticasone Propionate) .... 2 Sprays Into Each Nostril Daily For Allergic Rhinitis 5)  Albuterol 90 Mcg/act Aers (Albuterol) .... 2 Puffs Every 4 Hours As Needed 6)  Singulair 10 Mg  Tabs (Montelukast Sodium) .... One Tablet Daily For Rhinitis 7)  Ipratropium Bromide 0.06 %  Soln (Ipratropium Bromide) .... 2 Sprays Into Each Nostril Every 8-12 Hours As Needed For Runny Nose Due To Allergies 8)  Symbicort 160-4.5 Mcg/act Aero (Budesonide-Formoterol Fumarate) .... 2 Inhalation Bid 9)  Omeprazole 20 Mg Cpdr (Omeprazole) .... One By Mouth Daily 10)  Ibuprofen 600 Mg  Tabs (Ibuprofen) .Marland Kitchen.. 1 Tab By Mouth Q 6 Hours Prn For Pain 11)  Selenium Sulfide 2.5 % Lotn (Selenium Sulfide)  .... Use Daily As Directed Disp 1 Bottle 12)  Caltrate 600+d Plus 600-400 Mg-Unit Chew (Calcium Carbonate-Vit D-Min) .Marland Kitchen.. 1 Tablet By Mouth Daily 13)  Nystatin 100000 Unit/gm Crea (Nystatin) .... Apply To Groin Two Times A Day As Needed For Rash 14)  Omeprazole 40 Mg Cpdr (Omeprazole) .... One Tab in Am, One Tab in The Pm X 2 Weeks, Then One Tab Daily 15)  Depo-Provera 150 Mg/ml Susp (Medroxyprogesterone Acetate) .... Q 3 Months, Per Protocol  Allergies (verified): 1)  ! Sudafed 2)  ! Sulfa 3)  Tramadol Hcl (Tramadol Hcl)  Past History:  Past Medical History: Last updated: 03/03/2010 MENORRHAGIA (ICD-626.2)    - Depo Provera, started 02/2010 ANEMIA (ICD-285.9) MENTAL RETARDATION (ICD-319) ASTHMA, PERSISTENT, MILD (ICD-493.90) GERD (ICD-530.81) SOMATIZATION DISORDER (ICD-300.81) ALLERGIC RHINITIS (ICD-477.9)    - Followed by Allergy Specialist EPILEPSY (ICD-780.39)    - Followed by Neurology LIVER FUNCTION TESTS, ABNORMAL (ICD-794.8)    - Followed by GI, Dr. Matthias Hughs OSTEOARTHRITIS, LOWER LEG (ICD-715.96) OBESITY, NOS (ICD-278.00) CARDIAC FLOW MURMUR (ICD-785.2) PES PLANUS (ICD-754.61) PMH-FH-SH reviewed for relevance  Social History: Reviewed history from 03/03/2010 and no changes required. Pt lives at home alone.  Her Uncle lives next door.  She has a home health aid who visits three hours a day to help with cooking, cleaning.  [place]ice can also do these things herself.  She used to live with and care for her mother.  Her mother passed away early in 2006-10-16.  POA: Vienne Corcoran [phone]. Naiyah is okay with direct communication between PCP and POA.   Review of Systems      See HPI  Physical Exam  General:  Well-developed, well-nourished, in no acute distress; alert, appropriate and cooperative throughout examination. Vitals reviewed. Eyes:  No injection. Ears:  R ear normal and L ear normal.   Nose:  Mild nasal discharge, mucosal pallor.   Mouth:  MMM, no PND. Neck:  No  deformities, masses, or tenderness noted. Lungs:  Normal WOB, no wheezing or crackles, good air movement.  Heart:  Normal rate and regular rhythm.  Pulses:  2+ radial and dp pulses. Extremities:  No edema. Psych:  Oriented X3, memory intact for recent and remote, normally interactive, good eye contact, not anxious appearing, and not depressed appearing. Intellectually disabled.   Impression & Recommendations:  Problem # 1:  VIRAL URI (ICD-465.9) Assessment New  No RED FLAGs. Reassured patient.  Her updated medication list for this problem includes:    Fexofenadine Hcl 180 Mg Tabs (Fexofenadine hcl) .Marland Kitchen... Take 1 tablet by mouth once a day    Ibuprofen 600 Mg Tabs (Ibuprofen) .Marland Kitchen... 1 tab by mouth q 6 hours prn for pain  Orders: [facility]- Est  Level 4 (62130)  Problem # 2:  ALLERGIC RHINITIS (ICD-477.9) Assessment: Unchanged  Her updated medication list for this problem includes:    Fexofenadine Hcl 180 Mg Tabs (Fexofenadine hcl) .Marland Kitchen... Take 1 tablet by mouth once a day    Flonase 50 Mcg/act Susp (Fluticasone propionate) .Marland Kitchen... 2 sprays into each nostril daily for allergic rhinitis    Ipratropium Bromide 0.06 % Soln (Ipratropium bromide) .Marland Kitchen... 2 sprays into each nostril every 8-12 hours as needed for runny nose due to allergies  Orders: [facility]- Est  Level 4 (86578)  Problem # 3:  ASTHMA, PERSISTENT (ICD-493.90) Assessment: Unchanged  Her updated medication list for this problem includes:    Albuterol 90 Mcg/act Aers (Albuterol) .Marland Kitchen... 2 puffs every 4 hours as needed    Singulair 10 Mg Tabs (Montelukast sodium) ..... One tablet daily for rhinitis    Symbicort 160-4.5 Mcg/act Aero (Budesonide-formoterol fumarate) .Marland Kitchen... 2 inhalation bid  Orders: [facility]- Est  Level 4 (46962)  Complete Medication List: 1)  Depakote 250 Mg Tbec (Divalproex sodium) .... Take 1 tablet by mouth four times a day 2)  Tegretol Xr 400 Mg Tb12 (Carbamazepine) .... Take 1 tablet by mouth twice a day 3)  Fexofenadine  Hcl 180 Mg Tabs (Fexofenadine hcl) .... Take 1 tablet by mouth once a day 4)  Flonase 50 Mcg/act Susp (Fluticasone propionate) .... 2 sprays into each nostril daily for allergic rhinitis 5)  Albuterol 90 Mcg/act Aers (Albuterol) .... 2 puffs every 4 hours as needed 6)  Singulair 10 Mg Tabs (Montelukast sodium) .... One tablet daily for rhinitis 7)  Ipratropium Bromide 0.06 % Soln (Ipratropium bromide) .... 2 sprays into each nostril every 8-12 hours as needed for runny nose due to allergies 8)  Symbicort 160-4.5 Mcg/act Aero (Budesonide-formoterol fumarate) .... 2 inhalation bid 9)  Omeprazole 20 Mg Cpdr (Omeprazole) .... One by mouth daily 10)  Ibuprofen 600 Mg Tabs (Ibuprofen) .Marland Kitchen.. 1 tab by mouth q 6 hours prn for pain 11)  Selenium Sulfide 2.5 % Lotn (Selenium sulfide) .... Use daily as directed disp 1 bottle 12)  Caltrate  600+d Plus 600-400 Mg-unit Chew (Calcium carbonate-vit d-min) .Marland Kitchen.. 1 tablet by mouth daily 13)  Nystatin 100000 Unit/gm Crea (Nystatin) .... Apply to groin two times a day as needed for rash 14)  Omeprazole 40 Mg Cpdr (Omeprazole) .... One tab in am, one tab in the pm x 2 weeks, then one tab daily 15)  Depo-provera 150 Mg/ml Susp (Medroxyprogesterone acetate) .... Q 3 months, per protocol  Patient Instructions: 1)  It was nice to see you today! 2)  HAPPY BIRTHDAY! 3)  Follow up in one month or sooner if you need me.   Orders Added: 1)  [facility]- Est  Level 4 [95621]    Prevention & Chronic Care Immunizations   Influenza vaccine: Fluvax Non-MCR  (03/03/2010)   Influenza vaccine due: 02/15/2009    Tetanus booster: 02/07/2010: Tdap   Tetanus booster due: 05/28/2013    Pneumococcal vaccine: Not documented  Other Screening   Pap smear: Normal  (08/29/2006)   Pap smear due: 08/29/2007   Smoking status: never  (03/03/2010)  Lipids   Total Cholesterol: 267  (02/04/2008)   LDL: 142  (02/04/2008)   LDL Direct: Not documented   HDL: 106  (02/04/2008)    Triglycerides: 94  (02/04/2008)

## 2010-06-27 NOTE — Progress Notes (Signed)
Summary: results  Phone Note Call from Patient Call back at Home Phone [phone]   Caller: Patient Summary of Call: wants to know test results Initial call taken by: De Nurse,  January 04, 2010 3:49 PM  Follow-up for Phone Call        told her stool samples were all negative. stools worse after meals.  not taking immodium.  told her to get it & use. eating fresh cherries. told her to stop the fresh fruit & juices. drink water. with immodium & less intake of simple sugars, she should return to baseline. call if this does not help after 2-3 days. she agreed Follow-up by: Golden Circle RN,  January 04, 2010 4:24 PM

## 2010-06-27 NOTE — Progress Notes (Signed)
  Phone Note Call from Patient   Caller: Patient Reason for Call: Acute Illness, Talk to Doctor Summary of Call: Pt had some injury to the leg with metal object. Wanted to know if she need tetanus shot and when was it last given. Review of EMR shows last shot was in 05/29/2003 so she will need a booster. Also she needs to be seen in ED.  Initial call taken by: Clerance Lav MD,  February 05, 2010 7:18 AM

## 2010-06-27 NOTE — Assessment & Plan Note (Signed)
Summary: f/u eo   Vital Signs:  Patient profile:   35 year old female Pulse rate:   89 / minute BP sitting:   115 / 77  (right arm) Cuff size:   regular  Vitals Entered By: Jimmy Footman, CMA (February 16, 2010 11:20 AM) CC: f/u Is Patient Diabetic? No Pain Assessment Patient in pain? no        Primary Care Provider:  Helane Rima DO  CC:  f/u.  History of Present Illness: 35 yo F:  1. Vaginal DC: patient states that she is having same symptoms as previously when found to have yeast infection. + vaginal discharge, white, itchy, with odor. starting her period. no dysuria, abdominal pain, back pain. no other concerns. still with intermittent diarrhea.  Habits & Providers  Alcohol-Tobacco-Diet     Alcohol drinks/day: 0     Tobacco Status: never     Passive Smoke Exposure: yes  Current Medications (verified): 1)  Depakote 250 Mg Tbec (Divalproex Sodium) .... Take 1 Tablet By Mouth Four Times A Day 2)  Tegretol Xr 400 Mg Tb12 (Carbamazepine) .... Take 1 Tablet By Mouth Twice A Day 3)  Fexofenadine Hcl 180 Mg Tabs (Fexofenadine Hcl) .... Take 1 Tablet By Mouth Once A Day 4)  Flonase 50 Mcg/act  Susp (Fluticasone Propionate) .... 2 Sprays Into Each Nostril Daily For Allergic Rhinitis 5)  Albuterol 90 Mcg/act Aers (Albuterol) .... 2 Puffs Every 4 Hours As Needed 6)  Singulair 10 Mg  Tabs (Montelukast Sodium) .... One Tablet Daily For Rhinitis 7)  Ipratropium Bromide 0.06 %  Soln (Ipratropium Bromide) .... 2 Sprays Into Each Nostril Every 8-12 Hours As Needed For Runny Nose Due To Allergies 8)  Symbicort 160-4.5 Mcg/act Aero (Budesonide-Formoterol Fumarate) .... 2 Inhalation Bid 9)  Omeprazole 40 Mg Cpdr (Omeprazole) .... 2 Tabs By Mouth Bid 10)  Ibuprofen 600 Mg  Tabs (Ibuprofen) .Marland Kitchen.. 1 Tab By Mouth Q 6 Hours Prn For Pain 11)  Selenium Sulfide 2.5 % Lotn (Selenium Sulfide) .... Use Daily As Directed Disp 1 Bottle 12)  Caltrate 600+d Plus 600-400 Mg-Unit Chew (Calcium  Carbonate-Vit D-Min) .Marland Kitchen.. 1 Tablet By Mouth Daily 13)  Nystatin 100000 Unit/gm Crea (Nystatin) .... Apply To Groin Two Times A Day As Needed For Rash  Allergies (verified): 1)  ! Sudafed 2)  ! Sulfa 3)  Tramadol Hcl (Tramadol Hcl) PMH-FH-SH reviewed for relevance  Review of Systems      See HPI  Physical Exam  General:  Well-developed, well-nourished, in no acute distress; alert, appropriate and cooperative throughout examination. Vitals reviewed. Abdomen:  Bowel sounds positive,abdomen soft and non-tender without masses, organomegaly or hernias noted. Genitalia:  Normal introitus, no external lesions, mucosa pink and moist, and no vaginal or cervical lesions.  Mild intertrigo.   Impression & Recommendations:  Problem # 1:  VAGINAL DISCHARGE (ICD-623.5)  Wet prep negative. Discussed normal vaginal discharge.  Orders: [facility]- Est Level  3 (16109)  Problem # 2:  INTERTRIGO, CANDIDAL (UEA-540.98) Assessment: New  Rx Nystatin cream to be applied as needed for rash.  Orders: [facility]- Est Level  3 (11914)  Complete Medication List: 1)  Depakote 250 Mg Tbec (Divalproex sodium) .... Take 1 tablet by mouth four times a day 2)  Tegretol Xr 400 Mg Tb12 (Carbamazepine) .... Take 1 tablet by mouth twice a day 3)  Fexofenadine Hcl 180 Mg Tabs (Fexofenadine hcl) .... Take 1 tablet by mouth once a day 4)  Flonase 50 Mcg/act Susp (Fluticasone propionate) .Marland KitchenMarland KitchenMarland Kitchen  2 sprays into each nostril daily for allergic rhinitis 5)  Albuterol 90 Mcg/act Aers (Albuterol) .... 2 puffs every 4 hours as needed 6)  Singulair 10 Mg Tabs (Montelukast sodium) .... One tablet daily for rhinitis 7)  Ipratropium Bromide 0.06 % Soln (Ipratropium bromide) .... 2 sprays into each nostril every 8-12 hours as needed for runny nose due to allergies 8)  Symbicort 160-4.5 Mcg/act Aero (Budesonide-formoterol fumarate) .... 2 inhalation bid 9)  Omeprazole 40 Mg Cpdr (Omeprazole) .... 2 tabs by mouth bid 10)  Ibuprofen 600 Mg Tabs  (Ibuprofen) .Marland Kitchen.. 1 tab by mouth q 6 hours prn for pain 11)  Selenium Sulfide 2.5 % Lotn (Selenium sulfide) .... Use daily as directed disp 1 bottle 12)  Caltrate 600+d Plus 600-400 Mg-unit Chew (Calcium carbonate-vit d-min) .Marland Kitchen.. 1 tablet by mouth daily 13)  Nystatin 100000 Unit/gm Crea (Nystatin) .... Apply to groin two times a day as needed for rash  [facility] ([address]  Patient Instructions: 1)  It was nice to see you today! 2)  I have sent a cream to your pharmacy. Prescriptions: NYSTATIN 100000 UNIT/GM CREA (NYSTATIN) apply to groin two times a day as needed for rash  #15 tube x 0   Entered and Authorized by:   Helane Rima DO   Signed by:   Helane Rima DO on 02/16/2010   Method used:   Electronically to        [id number] (retail)       [address]       [place], [place]  [address]       Ph: [phone] or [phone]       Fax: [phone]   RxID:   [id number]   Laboratory Results  Date/Time Received: February 16, 2010 11:52 AM  Date/Time Reported: ...........test performed by...........Marland KitchenLupita Leash February 16, 2010 11:56 AM    [employer] Source: vaginal WBC/hpf: 0-3 Bacteria/hpf: 2+ Clue cells/hpf: none Yeast/hpf: none Trichomonas/hpf: none Comments: >20 RBC's present ...........test performed by...........Marland KitchenTerese Door, CMA

## 2010-06-27 NOTE — Progress Notes (Signed)
Summary: phn msg  Phone Note Call from Patient Call back at Home Phone [phone]   Caller: Patient Summary of Call: pt is waiting to hear from doctor in referrance to 10/13 phn msg  isn't sure if she should go to daycare  Initial call taken by: De Nurse,  March 14, 2010 1:47 PM  Follow-up for Phone Call        she wanted to know if she was doing too much. states she goes to daycare 5 days a week, sings in the choir & is involved with church activities. daycare has bowling & other activities. told her it is good to stay involved & active. says she may be dropped from daycare due to the number of days she has missed. told her to discuss this issue with the person in charge at daycare. she has an appt a few weeks off. offered her one this week. she declined tld her if she wakes up & does not feel well or wanting to go, that is her decision Follow-up by: Golden Circle RN,  March 14, 2010 2:13 PM

## 2010-06-27 NOTE — Assessment & Plan Note (Signed)
Summary: hurt foot/needs tetnus,df   Vital Signs:  Patient profile:   35 year old female Weight:      147.1 pounds Pulse rate:   82 / minute BP sitting:   120 / 72  (left arm) Cuff size:   regular  Vitals Entered By: [name] slade,cma CC: right foot injury from screw driver (fell on her foot) Is Patient Diabetic? No Pain Assessment Patient in pain? no        Primary Care Provider:  Helane Rima DO  CC:  right foot injury from screw driver (fell on her foot).  History of Present Illness: [name] reports using a scredriver to repair her computer desk 2 days ago and it slipped and it stabbed a small hole in her fooot.  She has kept it clean.  Yesterday it was more swollen and today it is less so.  She came in mostly for her tetnus shot.  She denies pain or fever.  She does not think that her nasal sprays are working.  She feels when the pollen is high she still has drinage.  She apparently is receiving allergy shots.  Habits & Providers  Alcohol-Tobacco-Diet     Tobacco Status: never  Current Medications (verified): 1)  Depakote 250 Mg Tbec (Divalproex Sodium) .... Take 1 Tablet By Mouth Four Times A Day 2)  Tegretol Xr 400 Mg Tb12 (Carbamazepine) .... Take 1 Tablet By Mouth Twice A Day 3)  Fexofenadine Hcl 180 Mg Tabs (Fexofenadine Hcl) .... Take 1 Tablet By Mouth Once A Day 4)  Flonase 50 Mcg/act  Susp (Fluticasone Propionate) .... 2 Sprays Into Each Nostril Daily For Allergic Rhinitis 5)  Albuterol 90 Mcg/act Aers (Albuterol) .... 2 Puffs Every 4 Hours As Needed 6)  Singulair 10 Mg  Tabs (Montelukast Sodium) .... One Tablet Daily For Rhinitis 7)  Ipratropium Bromide 0.06 %  Soln (Ipratropium Bromide) .... 2 Sprays Into Each Nostril Every 8-12 Hours As Needed For Runny Nose Due To Allergies 8)  Symbicort 160-4.5 Mcg/act Aero (Budesonide-Formoterol Fumarate) .... 2 Inhalation Bid 9)  Omeprazole 40 Mg Cpdr (Omeprazole) .... 2 Tabs By Mouth Bid 10)  Ibuprofen 600 Mg  Tabs  (Ibuprofen) .Marland Kitchen.. 1 Tab By Mouth Q 6 Hours Prn For Pain 11)  Selenium Sulfide 2.5 % Lotn (Selenium Sulfide) .... Use Daily As Directed Disp 1 Bottle 12)  Caltrate 600+d Plus 600-400 Mg-Unit Chew (Calcium Carbonate-Vit D-Min) .Marland Kitchen.. 1 Tablet By Mouth Daily  Allergies (verified): 1)  ! Sudafed 2)  ! Sulfa 3)  Tramadol Hcl (Tramadol Hcl)  Review of Systems      See HPI  Physical Exam  General:  Looked well Skin:  very small puncture site, with scabbing and some surrounding edema.  No signs of secondary infection.   Impression & Recommendations:  Problem # 1:  LACERATION, FOOT (ICD-892.0) keep covered wtih antibiotic ointment for one week, may shower and keep clean.  Return for pain or swelling. Orders: [facility]- Est Level  3 (16109)  Problem # 2:  ALLERGIC RHINITIS (ICD-477.9) Encouraged to continue current plan, and expect less pollen with change in weather. Her updated medication list for this problem includes:    Fexofenadine Hcl 180 Mg Tabs (Fexofenadine hcl) .Marland Kitchen... Take 1 tablet by mouth once a day    Flonase 50 Mcg/act Susp (Fluticasone propionate) .Marland Kitchen... 2 sprays into each nostril daily for allergic rhinitis    Ipratropium Bromide 0.06 % Soln (Ipratropium bromide) .Marland Kitchen... 2 sprays into each nostril every 8-12 hours as needed  for runny nose due to allergies  Complete Medication List: 1)  Depakote 250 Mg Tbec (Divalproex sodium) .... Take 1 tablet by mouth four times a day 2)  Tegretol Xr 400 Mg Tb12 (Carbamazepine) .... Take 1 tablet by mouth twice a day 3)  Fexofenadine Hcl 180 Mg Tabs (Fexofenadine hcl) .... Take 1 tablet by mouth once a day 4)  Flonase 50 Mcg/act Susp (Fluticasone propionate) .... 2 sprays into each nostril daily for allergic rhinitis 5)  Albuterol 90 Mcg/act Aers (Albuterol) .... 2 puffs every 4 hours as needed 6)  Singulair 10 Mg Tabs (Montelukast sodium) .... One tablet daily for rhinitis 7)  Ipratropium Bromide 0.06 % Soln (Ipratropium bromide) .... 2 sprays into  each nostril every 8-12 hours as needed for runny nose due to allergies 8)  Symbicort 160-4.5 Mcg/act Aero (Budesonide-formoterol fumarate) .... 2 inhalation bid 9)  Omeprazole 40 Mg Cpdr (Omeprazole) .... 2 tabs by mouth bid 10)  Ibuprofen 600 Mg Tabs (Ibuprofen) .Marland Kitchen.. 1 tab by mouth q 6 hours prn for pain 11)  Selenium Sulfide 2.5 % Lotn (Selenium sulfide) .... Use daily as directed disp 1 bottle 12)  Caltrate 600+d Plus 600-400 Mg-unit Chew (Calcium carbonate-vit d-min) .Marland Kitchen.. 1 tablet by mouth daily  Other Orders: Tdap => 50yrs IM (16109) Admin 1st Vaccine (60454)  Patient Instructions: 1)  Continue using the current nasal sprays, you will improve after the first frost 2)  Keep the bandaid on your small wound for one week   Immunizations Administered:  Tetanus Vaccine:    Vaccine Type: Tdap    Site: left deltoid    Mfr: [employer]    Dose: 0.5 ml    Route: IM    Given by: Loralee Pacas CMA    Exp. Date: 02/16/2012    Lot #: [id number]    VIS given: 04/14/08 version given February 07, 2010.

## 2010-06-27 NOTE — Progress Notes (Signed)
Summary: meds lest  Phone Note Call from Patient Call back at Home Phone [phone]   Caller: Patient Summary of Call: needs to fax meds list for her day care [phone] - Program Director - Chapman Moss  also Sarna cream that is OTC needs to be on the meds list also for the center to let her use it there. Initial call taken by: De Nurse,  August 18, 2009 3:50 PM  Follow-up for Phone Call        I printed the med report & put it in pcp chart box. she can add the Sarna to that list & then we can fax Follow-up by: Golden Circle RN,  August 18, 2009 4:16 PM

## 2010-06-27 NOTE — Consult Note (Signed)
Summary: [facility]   Imported By: Clydell Hakim 03/08/2010 09:25:00  _____________________________________________________________________  External Attachment:    Type:   Image     Comment:   External Document

## 2010-06-27 NOTE — Assessment & Plan Note (Signed)
Summary: ORTHOTICS   Vital Signs:  Patient profile:   35 year old female BP sitting:   121 / 83  Vitals Entered By: Lillia Pauls CMA (June 13, 2009 2:32 PM)  Primary Care Provider:  Ardeen Garland  MD   History of Present Illness: worsening B foot pain. Worsening as her orthotics are wearing down. she walks everywhere--5 or more miles a day. Feet hurting esp under MT heads, esp r fifth  Allergies: 1)  ! Sudafed 2)  ! Sulfa 3)  Tramadol Hcl (Tramadol Hcl)  Physical Exam  Additional Exam:  B severe pes planus. calliosity B laterally. Loss transverse arch B.  short toes esp laterally . gait is fairly short stride length.   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 Take 1 tablet by mouth four times a day 2)  Fexofenadine Hcl 180 Mg Tabs (Fexofenadine hcl) .... Take 1 tablet by mouth once a day 3)  Flonase 50 Mcg/act Susp (Fluticasone propionate) .... 2 sprays into each nostril daily for allergic rhinitis 4)  Tegretol Xr 400 Mg Tb12 (Carbamazepine) .... Take 1 tablet by mouth twice a day 5)  Albuterol 90 Mcg/act Aers (Albuterol) .... 2 puffs every 4 hours as needed 6)  Selenos 2.25 % Sham (Selenium sulf-pyrithione-urea) .... Shampoo daily.  do not use any other shampoo. 7)  Singulair 10 Mg Tabs (Montelukast sodium) .... One tablet daily for rhinitis 8)  Ipratropium Bromide 0.06 % Soln (Ipratropium bromide) .... 2 sprays into each nostril every 8-12 hours as needed for runny nose due to allergies 9)  Symbicort 160-4.5 Mcg/act Aero (Budesonide-formoterol fumarate) .... 2 inhalation bid 10)  Ibuprofen 600 Mg Tabs (Ibuprofen) .Marland Kitchen.. 1 tab by mouth q 6 hours prn for pain 11)   Omeprazole 40 Mg Cpdr (Omeprazole) .... 2 tabs by mouth bid 12)  Desipramine Hcl 25 Mg Tabs (Desipramine hcl) .Marland Kitchen.. 1 tab by mouth at bedtime. 13)  Caltrate 600+d Plus 600-400 Mg-unit Chew (Calcium carbonate-vit d-min) .Marland Kitchen.. 1 tablet by mouth daily 14)  Ondansetron 4 Mg Tbdp (Ondansetron) .Marland Kitchen.. 1 tab by mouth q 6 hrs as needed nausea

## 2010-06-27 NOTE — Miscellaneous (Signed)
Summary: PATIENT SUMMARY  Clinical Lists Changes  Problems: Removed problem of SEBORRHEIC DERMATITIS, NOS (ICD-690.10) Assessed MENTAL RETARDATION as comment only - Very high functioning.  Lives independently.  Her uncle is her guardian but I have never met him.  She does well on her own.  Goes to an adult daycare center during the day.  Relatively good historian.  Assessed SOMATIZATION DISORDER as comment only - Frequent vague or nonspecific problems.  Due to transportation issues, cannot always come in when she is having the problem and often resolved by the time she does come for her appointment.  Good historian with her symptoms.  Often only needs reassurance.  Assessed ASTHMA, PERSISTENT, MILD as comment only - Sees Dr. Willa Rough for her allergies/asthma.  Despite relatively mild astham, she is very sensitive to wheezing and shortness of breath which has lead to her beign on multiple medications for these problems (atroven, albuterol, flonase, singulair, symbicort).  Sees Dr. Willa Rough regularly.  SHe has been allergy tested for both foods and environmental things.  Often thinks symptoms are related to foods she ate but have not documented any true food allergies.  Does increase wheezing/SOB with any URI.  Frequently gets antibiotics from Dr. Willa Rough, usually amoxicillin or azithromycin.  Her updated medication list for this problem includes:    Albuterol 90 Mcg/act Aers (Albuterol) .Marland Kitchen... 2 puffs every 4 hours as needed    Singulair 10 Mg Tabs (Montelukast sodium) ..... One tablet daily for rhinitis    Symbicort 160-4.5 Mcg/act Aero (Budesonide-formoterol fumarate) .Marland Kitchen... 2 inhalation bid  Assessed GERD as comment only - OFten complains of chest pain.  Rarely worrisome/consistent with cardiac pain.  Often more consistent with GERD or costochondritis.   Her updated medication list for this problem includes:    Omeprazole 40 Mg Cpdr (Omeprazole) .Marland Kitchen... 2 tabs by mouth bid  Assessed EPILEPSY as comment only  - Follows with Dr. Sharene Skeans.  No seizures in many years.  She does have chronically elevated liver enzymes, believed to be due to her epileptic meds but they have remained stable and nto seemed to cause any problems and dosages have not been changed because she has been so stable.  Her updated medication list for this problem includes:    Depakote 250 Mg Tbec (Divalproex sodium) .Marland Kitchen... Take 1 tablet by mouth four times a day    Tegretol Xr 400 Mg Tb12 (Carbamazepine) .Marland Kitchen... Take 1 tablet by mouth twice a day  Medications: Removed medication of ONDANSETRON 4 MG TBDP (ONDANSETRON) 1 tab by mouth q 6 hrs as needed nausea Removed medication of MECLIZINE HCL 25 MG TABS (MECLIZINE HCL) 1 tab by mouth q 6 hrs as needed dizziness      Impression & Recommendations:  Problem # 1:  MENTAL RETARDATION (ICD-319) Very high functioning.  Lives independently.  Her uncle is her guardian but I have never met him.  She does well on her own.  Goes to an adult daycare center during the day.  Relatively good historian.   Problem # 2:  SOMATIZATION DISORDER (ICD-300.81) Frequent vague or nonspecific problems.  Due to transportation issues, cannot always come in when she is having the problem and often resolved by the time she does come for her appointment.  Good historian with her symptoms.  Often only needs reassurance.   Problem # 3:  ASTHMA, PERSISTENT, MILD (ICD-493.90) Sees Dr. Willa Rough for her allergies/asthma.  Despite relatively mild astham, she is very sensitive to wheezing and shortness of breath which has  lead to her beign on multiple medications for these problems (atroven, albuterol, flonase, singulair, symbicort).  Sees Dr. Willa Rough regularly.  SHe has been allergy tested for both foods and environmental things.  Often thinks symptoms are related to foods she ate but have not documented any true food allergies.  Does increase wheezing/SOB with any URI.  Frequently gets antibiotics from Dr. Willa Rough, usually  amoxicillin or azithromycin.  Her updated medication list for this problem includes:    Albuterol 90 Mcg/act Aers (Albuterol) .Marland Kitchen... 2 puffs every 4 hours as needed    Singulair 10 Mg Tabs (Montelukast sodium) ..... One tablet daily for rhinitis    Symbicort 160-4.5 Mcg/act Aero (Budesonide-formoterol fumarate) .Marland Kitchen... 2 inhalation bid  Problem # 4:  GERD (ICD-530.81) OFten complains of chest pain.  Rarely worrisome/consistent with cardiac pain.  Often more consistent with GERD or costochondritis.   Her updated medication list for this problem includes:    Omeprazole 40 Mg Cpdr (Omeprazole) .Marland Kitchen... 2 tabs by mouth bid  Problem # 5:  EPILEPSY (ICD-780.39) Follows with Dr. Sharene Skeans.  No seizures in many years.  She does have chronically elevated liver enzymes, believed to be due to her epileptic meds but they have remained stable and nto seemed to cause any problems and dosages have not been changed because she has been so stable.  Her updated medication list for this problem includes:    Depakote 250 Mg Tbec (Divalproex sodium) .Marland Kitchen... Take 1 tablet by mouth four times a day    Tegretol Xr 400 Mg Tb12 (Carbamazepine) .Marland Kitchen... Take 1 tablet by mouth twice a day  Complete Medication List: 1)  Depakote 250 Mg Tbec (Divalproex sodium) .... Take 1 tablet by mouth four times a day 2)  Tegretol Xr 400 Mg Tb12 (Carbamazepine) .... Take 1 tablet by mouth twice a day 3)  Fexofenadine Hcl 180 Mg Tabs (Fexofenadine hcl) .... Take 1 tablet by mouth once a day 4)  Flonase 50 Mcg/act Susp (Fluticasone propionate) .... 2 sprays into each nostril daily for allergic rhinitis 5)  Albuterol 90 Mcg/act Aers (Albuterol) .... 2 puffs every 4 hours as needed 6)  Singulair 10 Mg Tabs (Montelukast sodium) .... One tablet daily for rhinitis 7)  Ipratropium Bromide 0.06 % Soln (Ipratropium bromide) .... 2 sprays into each nostril every 8-12 hours as needed for runny nose due to allergies 8)  Symbicort 160-4.5 Mcg/act Aero  (Budesonide-formoterol fumarate) .... 2 inhalation bid 9)  Omeprazole 40 Mg Cpdr (Omeprazole) .... 2 tabs by mouth bid 10)  Desipramine Hcl 25 Mg Tabs (Desipramine hcl) .Marland Kitchen.. 1 tab by mouth at bedtime. 11)  Ibuprofen 600 Mg Tabs (Ibuprofen) .Marland Kitchen.. 1 tab by mouth q 6 hours prn for pain 12)  Selenium Sulfide 2.5 % Lotn (Selenium sulfide) .... Use daily as directed disp 1 bottle 13)  Caltrate 600+d Plus 600-400 Mg-unit Chew (Calcium carbonate-vit d-min) .Marland Kitchen.. 1 tablet by mouth daily

## 2010-06-27 NOTE — Progress Notes (Signed)
Summary: triage  Phone Note Call from Patient Call back at Home Phone [phone]   Caller: Patient Summary of Call: Pt still having stomach issues. Can't keep anything on her stomach. Initial call taken by: Clydell Hakim,  July 14, 2009 3:50 PM  Follow-up for Phone Call        vomiting since Tuesday. NO vomiting today. can keep liquids down. ate hotdog last night.  pain with baked, diced chicken today at her daycare center. ate chef salad which gave her diarrhea yesterday.  taking her omeprazole two times a day. states when she buys chicken patties from [employer] she always gets sick. told her to buy real chicken & cook it herself. rinse all salad fixings well before consuming.  for the next 2 days. no greasy foods or raw vegtables. eat a bland diet. gave examples. to call tomorow if still feeling unwell for appt or if sick over the weekend may use UC. she agreed with plan. from diet recall she eats alot of preprepared foods & lots of oily, fatty foods. she was ok wityh waiting to see if stomach calmed down by dietary changes Follow-up by: Golden Circle RN,  July 14, 2009 3:54 PM

## 2010-06-27 NOTE — Progress Notes (Signed)
Summary: re: FMLA/ts  ---- Converted from flag ---- ---- 12/09/2009 8:32 AM, Helane Rima DO wrote: Patient requesting FMLA paperwork completed. Please ask her to make an appointment so that I can meet and evaluate her. ------------------------------  called pt and lmvm to sched. ov

## 2010-06-27 NOTE — Assessment & Plan Note (Signed)
Summary: dizziness,tcb   Vital Signs:  Patient profile:   35 year old female Height:      61 inches Weight:      153.9 pounds Temp:     98.8 degrees F oral Pulse rate:   94 / minute BP sitting:   115 / 79  (left arm) Cuff size:   regular  Vitals Entered By: San Morelle, SMA CC: dizzy X68month Pain Assessment Patient in pain? yes     Location: stomach  Intensity: 8   Primary Care Provider:  Ardeen Garland  MD  CC:  dizzy X17month.  History of Present Illness: [name] comes in today for dizziness.  Started about 3-4 weeks ago suddenly after a GI illness.  Saw her allergist who thought it may be due to sinus issues and told her to do saline spray twice a day (she is already on flonase, astepro, sinulair, allegra).  However, it isn't helping.  No headache, facial pain, runny nose, congestion, sore throat, or cough.  She describes the dizziness as spinning sensation.  Worse when she gets up suddenly out of bed, bends over to tie her shoe or pick something up, and when it is raining.  States it has been there everyday all day.  When it gets bad it does make her nauseated but she hasn't thrown up.  She has never had it before.   Habits & Providers  Alcohol-Tobacco-Diet     Tobacco Status: never  Allergies: 1)  ! Sudafed 2)  ! Sulfa 3)  Tramadol Hcl (Tramadol Hcl)  Physical Exam  General:  overweight, alert, well-groomed, NAD vitals reviewed.  Lungs:  normal WOB, no wheezing or crackles, good air movement.  Heart:  Normal rate and regular rhythm. S1 and S2 normal without gallop, murmur, click, rub or other extra sounds. Neurologic:  alert & oriented X3, cranial nerves II-XII intact, gait normal, and Romberg negative.   Psych:  Oriented X3, memory intact for recent and remote, normally interactive, good eye contact, not anxious appearing, and not depressed appearing.     Impression & Recommendations:  Problem # 1:  BENIGN PAROXYSMAL POSITIONAL VERTIGO (ICD-386.11) Assessment  New  Symptoms consistent with vertigo.  Will try  meclizine.  Patient given red flags to return for further eval.  Her updated medication list for this problem includes:    Fexofenadine Hcl 180 Mg Tabs (Fexofenadine hcl) .Marland Kitchen... Take 1 tablet by mouth once a day    Ondansetron 4 Mg Tbdp (Ondansetron) .Marland Kitchen... 1 tab by mouth q 6 hrs as needed nausea    Meclizine Hcl 25 Mg Tabs (Meclizine hcl) .Marland Kitchen... 1 tab by mouth q 6 hrs as needed dizziness  Orders: [facility]- Est  Level 4 (16109)  Complete Medication List: 1)  Depakote 250 Mg Tbec (Divalproex sodium) .... Take 1 tablet by mouth four times a day 2)  Fexofenadine Hcl 180 Mg Tabs (Fexofenadine hcl) .... Take 1 tablet by mouth once a day 3)  Flonase 50 Mcg/act Susp (Fluticasone propionate) .... 2 sprays into each nostril daily for allergic rhinitis 4)  Tegretol Xr 400 Mg Tb12 (Carbamazepine) .... Take 1 tablet by mouth twice a day 5)  Albuterol 90 Mcg/act Aers (Albuterol) .... 2 puffs every 4 hours as needed 6)  Selenium Sulfide 2.5 % Lotn (Selenium sulfide) .... Use daily as directed disp 1 bottle 7)  Singulair 10 Mg Tabs (Montelukast sodium) .... One tablet daily for rhinitis 8)  Ipratropium Bromide 0.06 % Soln (Ipratropium bromide) .... 2 sprays into  each nostril every 8-12 hours as needed for runny nose due to allergies 9)  Symbicort 160-4.5 Mcg/act Aero (Budesonide-formoterol fumarate) .... 2 inhalation bid 10)  Ibuprofen 600 Mg Tabs (Ibuprofen) .Marland Kitchen.. 1 tab by mouth q 6 hours prn for pain 11)  Omeprazole 40 Mg Cpdr (Omeprazole) .... 2 tabs by mouth bid 12)  Desipramine Hcl 25 Mg Tabs (Desipramine hcl) .Marland Kitchen.. 1 tab by mouth at bedtime. 13)  Caltrate 600+d Plus 600-400 Mg-unit Chew (Calcium carbonate-vit d-min) .Marland Kitchen.. 1 tablet by mouth daily 14)  Ondansetron 4 Mg Tbdp (Ondansetron) .Marland Kitchen.. 1 tab by mouth q 6 hrs as needed nausea 15)  Meclizine Hcl 25 Mg Tabs (Meclizine hcl) .Marland Kitchen.. 1 tab by mouth q 6 hrs as needed dizziness  Patient Instructions: 1)  Take the  meclizine if you need it for dizziness but if you aren't dizzy, you don't need to take it.  It may make you a little sleepy so be careful about when you take it.  2)  If your dizziness does not get better or it get worse or you get a fever or symptoms or the cold or flu, please return.  Prescriptions: MECLIZINE HCL 25 MG TABS (MECLIZINE HCL) 1 tab by mouth q 6 hrs as needed dizziness  #30 x 1   Entered and Authorized by:   Ardeen Garland  MD   Signed by:   Ardeen Garland  MD on 09/13/2009   Method used:   Print then Give to Patient   RxID:   [id number]

## 2010-06-27 NOTE — Progress Notes (Signed)
  Phone Note Call from Patient   Caller: Patient Summary of Call: took [employer], had several BMs. has continued to have waterry stools. no fever, chills. some abdominal pain. no blood. patient concerned about stomach flu. explained that as long as she is able to stay hydrated, there is nothing to do. she could try anti-diarrheal like immodium, but with caution since she is prone to constipation. important to stay hydrated. patient expressed understanding.  Initial call taken by: Lequita Asal  MD,  July 10, 2009 9:01 AM

## 2010-06-27 NOTE — Assessment & Plan Note (Signed)
Summary: f/u,df   Vital Signs:  Patient profile:   35 year old female Height:      61 inches Weight:      151.44 pounds BMI:     28.72 BSA:     1.68 Temp:     98.7 degrees F Pulse rate:   96 / minute BP sitting:   122 / 80  Vitals Entered By: Jone Baseman CMA (April 28, 2010 10:55 AM) CC: f/u thumb spasm, shoulder and hip pain Is Patient Diabetic? No Pain Assessment Patient in pain? yes     Location: left neck and shoulder Intensity: 9   Primary Care Provider:  Helane Rima DO  CC:  f/u thumb spasm and shoulder and hip pain.  History of Present Illness: 35 yo F:  1. Thumb Spasm: After taking Depakote and Tegretol a few days ago, bilateral, lasted 20 minutes, patient states that she was able to loosen her thumbs to get them to relax, but it happened again x 1. Never happened before. No other shaking/seizure-like activity, patient was standing, no LOC, no weakness/numbness/tingling. No change in medications. Neuro = Dr. Sharene Skeans, no appointment until next year.  2. Pain: Right hip and left neck. Hx muscle spasms req PT. Patient states that she doen't remember the exercises anymore. She req more PT for each issue. No trauma. No Hx OA. No neuro s/s.  Habits & Providers  Alcohol-Tobacco-Diet     Alcohol drinks/day: 0     Tobacco Status: never     Passive Smoke Exposure: yes  Current Medications (verified): 1)  Depakote 250 Mg Tbec (Divalproex Sodium) .... Take 1 Tablet By Mouth Four Times A Day 2)  Tegretol Xr 400 Mg Tb12 (Carbamazepine) .... Take 1 Tablet By Mouth Twice A Day 3)  Fexofenadine Hcl 180 Mg Tabs (Fexofenadine Hcl) .... Take 1 Tablet By Mouth Once A Day 4)  Flonase 50 Mcg/act  Susp (Fluticasone Propionate) .... 2 Sprays Into Each Nostril Daily For Allergic Rhinitis 5)  Albuterol 90 Mcg/act Aers (Albuterol) .... 2 Puffs Every 4 Hours As Needed 6)  Singulair 10 Mg  Tabs (Montelukast Sodium) .... One Tablet Daily For Rhinitis 7)  Ipratropium Bromide 0.06  %  Soln (Ipratropium Bromide) .... 2 Sprays Into Each Nostril Every 8-12 Hours As Needed For Runny Nose Due To Allergies 8)  Symbicort 160-4.5 Mcg/act Aero (Budesonide-Formoterol Fumarate) .... 2 Inhalation Bid 9)  Omeprazole 20 Mg Cpdr (Omeprazole) .... One By Mouth Daily 10)  Ibuprofen 600 Mg  Tabs (Ibuprofen) .Marland Kitchen.. 1 Tab By Mouth Q 6 Hours Prn For Pain 11)  Selenium Sulfide 2.5 % Lotn (Selenium Sulfide) .... Use Daily As Directed Disp 1 Bottle 12)  Caltrate 600+d Plus 600-400 Mg-Unit Chew (Calcium Carbonate-Vit D-Min) .Marland Kitchen.. 1 Tablet By Mouth Daily 13)  Nystatin 100000 Unit/gm Crea (Nystatin) .... Apply To Groin Two Times A Day As Needed For Rash 14)  Omeprazole 40 Mg Cpdr (Omeprazole) .... One Tab in Am, One Tab in The Pm X 2 Weeks, Then One Tab Daily 15)  Depo-Provera 150 Mg/ml Susp (Medroxyprogesterone Acetate) .... Q 3 Months, Per Protocol 16)  Metronidazole 500 Mg Tabs (Metronidazole) .Marland Kitchen.. 1 By Mouth Three Times A Day X 14 Days. Disp Qs 17)  Miralax   Powd (Polyethylene Glycol 3350) .Marland Kitchen.. 17g By Mouth Once Daily As Needed Constipation - Per Gi  Allergies (verified): 1)  ! Sudafed 2)  ! Sulfa 3)  Tramadol Hcl (Tramadol Hcl) PMH-FH-SH reviewed for relevance  Review of Systems  See HPI  Physical Exam  General:  VS noted.  Well, NAD. Neck:  No deformities, masses, or tenderness noted. Lungs:  Normal WOB, no wheezing or crackles, good air movement.  Heart:  Normal rate and regular rhythm.  Msk:  Hypertonic upper trap on left. Normal shoulder ROM. Normal UE strength and sensation. Normal neck ROM. No compression s/s. Thumbs normal ROM. Pulses:  2+ radial and dp pulses. Neurologic:  Alert & oriented X3, cranial nerves II-XII intact, strength normal in all extremities, sensation intact to light touch, gait normal, and DTRs symmetrical and normal.   Psych:  Converses slowly.   Impression & Recommendations:  Problem # 1:  CARPOPEDAL SPASM (ICD-781.7) Assessment New  Check  Tegretol, Depakote, and BMP (calcium).  Orders: [facility]- Est  Level 4 (16109)  Problem # 2:  HIP PAIN, RIGHT (ICD-719.45) Assessment: New  Her updated medication list for this problem includes:    Ibuprofen 600 Mg Tabs (Ibuprofen) .Marland Kitchen... 1 tab by mouth q 6 hours prn for pain  Orders: Physical Therapy Referral (PT) [facility]- Est  Level 4 (60454)  Problem # 3:  NECK PAIN, LEFT (ICD-723.1) Assessment: Unchanged  Her updated medication list for this problem includes:    Ibuprofen 600 Mg Tabs (Ibuprofen) .Marland Kitchen... 1 tab by mouth q 6 hours prn for pain  Orders: Physical Therapy Referral (PT) [facility]- Est  Level 4 (09811)  Complete Medication List: 1)  Depakote 250 Mg Tbec (Divalproex sodium) .... Take 1 tablet by mouth four times a day 2)  Tegretol Xr 400 Mg Tb12 (Carbamazepine) .... Take 1 tablet by mouth twice a day 3)  Fexofenadine Hcl 180 Mg Tabs (Fexofenadine hcl) .... Take 1 tablet by mouth once a day 4)  Flonase 50 Mcg/act Susp (Fluticasone propionate) .... 2 sprays into each nostril daily for allergic rhinitis 5)  Albuterol 90 Mcg/act Aers (Albuterol) .... 2 puffs every 4 hours as needed 6)  Singulair 10 Mg Tabs (Montelukast sodium) .... One tablet daily for rhinitis 7)  Ipratropium Bromide 0.06 % Soln (Ipratropium bromide) .... 2 sprays into each nostril every 8-12 hours as needed for runny nose due to allergies 8)  Symbicort 160-4.5 Mcg/act Aero (Budesonide-formoterol fumarate) .... 2 inhalation bid 9)  Omeprazole 20 Mg Cpdr (Omeprazole) .... One by mouth daily 10)  Ibuprofen 600 Mg Tabs (Ibuprofen) .Marland Kitchen.. 1 tab by mouth q 6 hours prn for pain 11)  Selenium Sulfide 2.5 % Lotn (Selenium sulfide) .... Use daily as directed disp 1 bottle 12)  Caltrate 600+d Plus 600-400 Mg-unit Chew (Calcium carbonate-vit d-min) .Marland Kitchen.. 1 tablet by mouth daily 13)  Nystatin 100000 Unit/gm Crea (Nystatin) .... Apply to groin two times a day as needed for rash 14)  Omeprazole 40 Mg Cpdr (Omeprazole) .... One tab in  am, one tab in the pm x 2 weeks, then one tab daily 15)  Depo-provera 150 Mg/ml Susp (Medroxyprogesterone acetate) .... Q 3 months, per protocol 16)  Metronidazole 500 Mg Tabs (Metronidazole) .Marland Kitchen.. 1 by mouth three times a day x 14 days. disp qs 17)  Miralax Powd (Polyethylene glycol 3350) .Marland Kitchen.. 17g by mouth once daily as needed constipation - per gi  Other Orders: Basic Met-[facility] [phone]) Tegretol-[facility] [phone]) Valproic Acid-[facility] ([id number])  Patient Instructions: 1)  It was nice to see you today! 2)  We are sending you to physical therapy. 3)  We are checking labs today.   Orders Added: 1)  Basic Met-[facility] [24401-02725] 2)  Tegretol-[facility] [80156-23380] 3)  Valproic Acid-[facility] [80164-23520] 4)  Physical Therapy Referral [PT] 5)  [facility]- Est  Level 4 [81191]    Prevention & Chronic Care Immunizations   Influenza vaccine: Fluvax Non-MCR  (03/03/2010)   Influenza vaccine due: 02/15/2009    Tetanus booster: 02/07/2010: Tdap   Tetanus booster due: 05/28/2013    Pneumococcal vaccine: Not documented  Other Screening   Pap smear: Normal  (08/29/2006)   Pap smear due: 08/29/2007   Smoking status: never  (04/28/2010)  Lipids   Total Cholesterol: 267  (02/04/2008)   LDL: 142  (02/04/2008)   LDL Direct: Not documented   HDL: 106  (02/04/2008)   Triglycerides: 94  (02/04/2008)

## 2010-06-27 NOTE — Assessment & Plan Note (Signed)
Summary: f/up,tcb   Vital Signs:  Patient profile:   35 year old female Height:      61 inches Weight:      156 pounds BMI:     29.58 Temp:     98.7 degrees F oral Pulse rate:   97 / minute BP sitting:   122 / 80  (left arm) Cuff size:   regular  Vitals Entered By: Tessie Fass CMA (June 01, 2009 9:00 AM) CC: F/U Is Patient Diabetic? No Pain Assessment Patient in pain? yes     Location: abdomen Intensity: 8   Primary Care Provider:  Ardeen Garland  MD  CC:  F/U.  History of Present Illness: [name] comes in today for a head cold and diarrhea.  1) Head cold - has had congestion and "swimmy-headed" feeling for about a week since it snowed.  No fever.  Slight cough.  Some nasal discharge.  Breathing fine.  Still using inhalers as directed, singulair, allegra.  No using any OTC medications. 2) Diarrhea - about 5 days having loose stools and nausea.  No vomitting.  Staying hydrated - drinking lots of water.  THe food at the daycare center seems to be upsetting her stomach.  Stools are non-bloody.  Not taking anything for it.   Habits & Providers  Alcohol-Tobacco-Diet     Tobacco Status: never  Allergies: 1)  ! Sudafed 2)  ! Sulfa 3)  Tramadol Hcl (Tramadol Hcl)  Physical Exam  General:  overweight, alert, well-groomed, NAD vitals reviewed.  Head:  no sinus tenderness Eyes:  conjunctiva clear, no injection, moist Ears:  External ear exam shows no significant lesions or deformities.  Otoscopic examination reveals clear canals, tympanic membranes are intact bilaterally without bulging, retraction, inflammation or discharge. Hearing is grossly normal bilaterally. Nose:  External nasal examination shows no deformity or inflammation. Nasal mucosa are pink and moist without lesions or exudates. Mouth:  Oral mucosa and oropharynx without lesions or exudates.  Teeth in good repair. Lungs:  Normal respiratory effort, chest expands symmetrically. Lungs are clear to auscultation, no  crackles or wheezes. Heart:  Normal rate and regular rhythm. S1 and S2 normal without gallop, murmur, click, rub or other extra sounds. Abdomen:  Bowel sounds positive,abdomen soft and non-tender without masses, organomegaly or hernias noted. Pulses:  2+radial and dp pulses Skin:  Intact without suspicious lesions or rashes Cervical Nodes:  No lymphadenopathy noted   Impression & Recommendations:  Problem # 1:  VIRAL URI (ICD-465.9) Assessment New  No indication for antibiotics.  Continue her chronic allergy and asthma meds.  Can try dayquil/nyquil if needed.  Plenty of fluids and rest.  Discussed should resolve over next 7 - 10 days.  The following medications were removed from the medication list:    Tussionex Pennkinetic Er 8-10 Mg/64ml Lqcr (Chlorpheniramine-hydrocodone) .Marland Kitchen... 1 teaspoon by mouth two times a day as needed cough Her updated medication list for this problem includes:    Fexofenadine Hcl 180 Mg Tabs (Fexofenadine hcl) .Marland Kitchen... Take 1 tablet by mouth once a day    Ibuprofen 600 Mg Tabs (Ibuprofen) .Marland Kitchen... 1 tab by mouth q 6 hours prn for pain  Orders: [facility]- Est  Level 4 (62130)  Problem # 2:  DIARRHEA (ICD-787.91) Assessment: New  Zofran for nausea which may also help with diarrhea.  Can use immodium if desired.  Stay hydrated with non-sugared beverages.  REturne if not resovledin 3-5 days.   Orders: [facility]- Est  Level 4 (86578)  Complete  Medication List: 1)  Depakote 250 Mg Tbec (Divalproex sodium) .... Take 1 tablet by mouth four times a day 2)  Fexofenadine Hcl 180 Mg Tabs (Fexofenadine hcl) .... Take 1 tablet by mouth once a day 3)  Flonase 50 Mcg/act Susp (Fluticasone propionate) .... 2 sprays into each nostril daily for allergic rhinitis 4)  Tegretol Xr 400 Mg Tb12 (Carbamazepine) .... Take 1 tablet by mouth twice a day 5)  Albuterol 90 Mcg/act Aers (Albuterol) .... 2 puffs every 4 hours as needed 6)  Selenos 2.25 % Sham (Selenium sulf-pyrithione-urea) .... Shampoo  daily.  do not use any other shampoo. 7)  Singulair 10 Mg Tabs (Montelukast sodium) .... One tablet daily for rhinitis 8)  Ipratropium Bromide 0.06 % Soln (Ipratropium bromide) .... 2 sprays into each nostril every 8-12 hours as needed for runny nose due to allergies 9)  Symbicort 160-4.5 Mcg/act Aero (Budesonide-formoterol fumarate) .... 2 inhalation bid 10)  Ibuprofen 600 Mg Tabs (Ibuprofen) .Marland Kitchen.. 1 tab by mouth q 6 hours prn for pain 11)  Omeprazole 40 Mg Cpdr (Omeprazole) .... 2 tabs by mouth bid 12)  Desipramine Hcl 25 Mg Tabs (Desipramine hcl) .Marland Kitchen.. 1 tab by mouth at bedtime. 13)  Caltrate 600+d Plus 600-400 Mg-unit Chew (Calcium carbonate-vit d-min) .Marland Kitchen.. 1 tablet by mouth daily 14)  Ondansetron 4 Mg Tbdp (Ondansetron) .Marland Kitchen.. 1 tab by mouth q 6 hrs as needed nausea  Patient Instructions: 1)  Your body will fight off your cold.  However it could take another week to 10 days.  Get plenty of rest and drink plenty of fluids.   2)  You stomach bug willl get better as well.  Use the zofran for nausea.  It will also help your diarrhea.  Be sure to stay hydrated with watery or non-sugared beverages. Prescriptions: ONDANSETRON 4 MG TBDP (ONDANSETRON) 1 tab by mouth q 6 hrs as needed nausea  #21 x 1   Entered and Authorized by:   Ardeen Garland  MD   Signed by:   Ardeen Garland  MD on 06/01/2009   Method used:   Print then Give to Patient   RxID:   [id number]

## 2010-06-27 NOTE — Assessment & Plan Note (Signed)
Summary: ORTHOTICS/MJD   Vital Signs:  Patient profile:   35 year old female Pulse rate:   92 / minute BP sitting:   113 / 75  (right arm)  Vitals Entered By: Rochele Pages RN (April 07, 2010 10:47 AM) CC: foot pain- orthotics   Primary Care Provider:  Helane Rima DO  CC:  foot pain- orthotics.  History of Present Illness: B foot pain was doing well until she lost her orthotics--left them on the bus. Pain is now worse than when wearing them. B feet, worse after lots of standing or particularly walking. No new injry. No nymbness or skin rash.  Habits & Providers  Alcohol-Tobacco-Diet     Tobacco Status: never  Current Medications (verified): 1)  Depakote 250 Mg Tbec (Divalproex Sodium) .... Take 1 Tablet By Mouth Four Times A Day 2)  Tegretol Xr 400 Mg Tb12 (Carbamazepine) .... Take 1 Tablet By Mouth Twice A Day 3)  Fexofenadine Hcl 180 Mg Tabs (Fexofenadine Hcl) .... Take 1 Tablet By Mouth Once A Day 4)  Flonase 50 Mcg/act  Susp (Fluticasone Propionate) .... 2 Sprays Into Each Nostril Daily For Allergic Rhinitis 5)  Albuterol 90 Mcg/act Aers (Albuterol) .... 2 Puffs Every 4 Hours As Needed 6)  Singulair 10 Mg  Tabs (Montelukast Sodium) .... One Tablet Daily For Rhinitis 7)  Ipratropium Bromide 0.06 %  Soln (Ipratropium Bromide) .... 2 Sprays Into Each Nostril Every 8-12 Hours As Needed For Runny Nose Due To Allergies 8)  Symbicort 160-4.5 Mcg/act Aero (Budesonide-Formoterol Fumarate) .... 2 Inhalation Bid 9)  Omeprazole 20 Mg Cpdr (Omeprazole) .... One By Mouth Daily 10)  Ibuprofen 600 Mg  Tabs (Ibuprofen) .Marland Kitchen.. 1 Tab By Mouth Q 6 Hours Prn For Pain 11)  Selenium Sulfide 2.5 % Lotn (Selenium Sulfide) .... Use Daily As Directed Disp 1 Bottle 12)  Caltrate 600+d Plus 600-400 Mg-Unit Chew (Calcium Carbonate-Vit D-Min) .Marland Kitchen.. 1 Tablet By Mouth Daily 13)  Nystatin 100000 Unit/gm Crea (Nystatin) .... Apply To Groin Two Times A Day As Needed For Rash 14)  Omeprazole 40 Mg Cpdr  (Omeprazole) .... One Tab in Am, One Tab in The Pm X 2 Weeks, Then One Tab Daily 15)  Depo-Provera 150 Mg/ml Susp (Medroxyprogesterone Acetate) .... Q 3 Months, Per Protocol  Allergies: 1)  ! Sudafed 2)  ! Sulfa 3)  Tramadol Hcl (Tramadol Hcl)  Review of Systems  The patient denies fever, weight loss, and weight gain.    Physical Exam  General:  alert, well-developed, well-nourished, and well-hydrated.   Msk:  B severe pes planus neuorvascularly intac tB loss of transverse arch Gait: out toeing. Overpronation B Skin on feet--no lesions or rash Additional Exam:  Patient was fitted for a : standard, cushioned, semi-rigid orthotic. The orthotic was heated and afterward the patient stood on the orthotic blank positioned on the orthotic stand. The patient was positioned in subtalar neutral position and 10 degrees of ankle dorsiflexion in a weight bearing stance. After completion of molding, a stable base was applied to the orthotic blank. The blank was ground to a stable position for weight bearing. Size:6 Base:black striped Posting: did not have any F3 white base so we used blur cusioned base andposted her medially forefoot B. 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 Take 1 tablet by mouth four times a day 2)  Tegretol Xr 400 Mg Tb12 (Carbamazepine) .... Take 1 tablet by mouth twice a day 3)  Fexofenadine Hcl 180 Mg Tabs (Fexofenadine hcl) .... Take 1 tablet by mouth once a day 4)  Flonase 50 Mcg/act Susp (Fluticasone propionate) .... 2 sprays into each nostril daily for allergic rhinitis 5)  Albuterol 90 Mcg/act Aers (Albuterol) .... 2 puffs every 4 hours as needed 6)  Singulair 10 Mg Tabs  (Montelukast sodium) .... One tablet daily for rhinitis 7)  Ipratropium Bromide 0.06 % Soln (Ipratropium bromide) .... 2 sprays into each nostril every 8-12 hours as needed for runny nose due to allergies 8)  Symbicort 160-4.5 Mcg/act Aero (Budesonide-formoterol fumarate) .... 2 inhalation bid 9)  Omeprazole 20 Mg Cpdr (Omeprazole) .... One by mouth daily 10)  Ibuprofen 600 Mg Tabs (Ibuprofen) .Marland Kitchen.. 1 tab by mouth q 6 hours prn for pain 11)  Selenium Sulfide 2.5 % Lotn (Selenium sulfide) .... Use daily as directed disp 1 bottle 12)  Caltrate 600+d Plus 600-400 Mg-unit Chew (Calcium carbonate-vit d-min) .Marland Kitchen.. 1 tablet by mouth daily 13)  Nystatin 100000 Unit/gm Crea (Nystatin) .... Apply to groin two times a day as needed for rash 14)  Omeprazole 40 Mg Cpdr (Omeprazole) .... One tab in am, one tab in the pm x 2 weeks, then one tab daily 15)  Depo-provera 150 Mg/ml Susp (Medroxyprogesterone acetate) .... Q 3 months, per protocol   Orders Added: 1)  Est. Patient Level IV [16109] 2)  [employer], each unit [L3002]

## 2010-06-27 NOTE — Progress Notes (Signed)
Summary: re: [place] r/s  Phone Note Call from Patient Call back at Home Phone [phone]   Caller: Patient Summary of Call: wasn't able to go to [place] today- transportation didn't pick her up - needs to resch Initial call taken by: De Nurse,  April 14, 2010 10:13 AM  Follow-up for Phone Call        r/s appt for [place] 04-25-10 at 9:15am at [address]. Pt has to be NPO after midnight. called pt and lmvm with detailed information. Pt may call back. Please give information. Thanks.  Follow-up by: Arlyss Repress CMA,,  April 14, 2010 4:41 PM  Additional Follow-up for Phone Call Additional follow up Details #1::        pt called back and I had pt appt and prep re-peated back to me. after going over appt, she told me, that she has r/s the appt for monday. that's alright, she will cancel the 04-25-10 appt. Additional Follow-up by: Arlyss Repress CMA,,  April 14, 2010 4:43 PM

## 2010-06-27 NOTE — Progress Notes (Signed)
Summary: phone note  Phone Note Call from Patient Call back at Home Phone [phone]   Caller: Patient Summary of Call: would like to speak to nurse about stomach pain and diarrhea. started on 11/04 thinks that it may be coming from the meds want to know what to do. she stopped taking it but is still having problems   Initial call taken by: Loralee Pacas CMA,  April 10, 2010 2:43 PM  Follow-up for Phone Call        called pt advised her to schedule appt for tomorrow Follow-up by: Tessie Fass CMA,  April 10, 2010 4:52 PM

## 2010-06-27 NOTE — Progress Notes (Signed)
Summary: Rx Req  Phone Note Refill Request Call back at Home Phone [phone] Message from:  Patient  Refills Requested: Medication #1:  OMEPRAZOLE 40 MG CPDR 2 tabs by mouth BID PT WOULD LIKE TO GET THE DOSAGE CHANGED TO 20MG S PER HER ALLGERY DOCTOR.  WOULD LIKE TO GET THE RX MAILED TO HER.  Initial call taken by: Clydell Hakim,  February 24, 2010 9:28 AM  Follow-up for Phone Call        Printed. Placed in to be mailed box.  Follow-up by: Helane Rima DO,  February 24, 2010 10:08 AM    New/Updated Medications: OMEPRAZOLE 20 MG CPDR (OMEPRAZOLE) one by mouth daily Prescriptions: OMEPRAZOLE 20 MG CPDR (OMEPRAZOLE) one by mouth daily  #90 x 3   Entered and Authorized by:   Helane Rima DO   Signed by:   Helane Rima DO on 02/24/2010   Method used:   Print then Give to Patient   RxID:   [id number]

## 2010-06-27 NOTE — Assessment & Plan Note (Signed)
Summary: f/up,tcb   Vital Signs:  Patient profile:   35 year old female Height:      61 inches Weight:      151 pounds BMI:     28.63 Temp:     98.4 degrees F oral Pulse rate:   90 / minute BP sitting:   124 / 77  (left arm) Cuff size:   regular  Vitals Entered By: Jimmy Footman, CMA (December 14, 2009 2:29 PM) CC: Meet new dr./ paperwork filled out Is Patient Diabetic? No Pain Assessment Patient in pain? no        Primary Care Provider:  Helane Rima DO  CC:  Meet new dr./ paperwork filled out.  History of Present Illness: 35 yo F, requesting paperwork to be completed.  1. Paperwork: went over it together. Patient lives alone. Has uncles and cousin nearby. Mom died in 10/28/06. Dad unknown. Goes to church. In gospel choir.  2.  Stomach Upset:  Continue to have loos, watery stools. See last OV note for more details.  No vomiting but some occassional nausea. No fevers. Hx difficult to obtain from patient.  Habits & Providers  Alcohol-Tobacco-Diet     Alcohol drinks/day: 0     Tobacco Status: never     Passive Smoke Exposure: yes  Current Medications (verified): 1)  Depakote 250 Mg Tbec (Divalproex Sodium) .... Take 1 Tablet By Mouth Four Times A Day 2)  Tegretol Xr 400 Mg Tb12 (Carbamazepine) .... Take 1 Tablet By Mouth Twice A Day 3)  Fexofenadine Hcl 180 Mg Tabs (Fexofenadine Hcl) .... Take 1 Tablet By Mouth Once A Day 4)  Flonase 50 Mcg/act  Susp (Fluticasone Propionate) .... 2 Sprays Into Each Nostril Daily For Allergic Rhinitis 5)  Albuterol 90 Mcg/act Aers (Albuterol) .... 2 Puffs Every 4 Hours As Needed 6)  Singulair 10 Mg  Tabs (Montelukast Sodium) .... One Tablet Daily For Rhinitis 7)  Ipratropium Bromide 0.06 %  Soln (Ipratropium Bromide) .... 2 Sprays Into Each Nostril Every 8-12 Hours As Needed For Runny Nose Due To Allergies 8)  Symbicort 160-4.5 Mcg/act Aero (Budesonide-Formoterol Fumarate) .... 2 Inhalation Bid 9)  Omeprazole 40 Mg Cpdr (Omeprazole) .... 2 Tabs  By Mouth Bid 10)  Desipramine Hcl 25 Mg Tabs (Desipramine Hcl) .Marland Kitchen.. 1 Tab By Mouth At Bedtime. 11)  Ibuprofen 600 Mg  Tabs (Ibuprofen) .Marland Kitchen.. 1 Tab By Mouth Q 6 Hours Prn For Pain 12)  Selenium Sulfide 2.5 % Lotn (Selenium Sulfide) .... Use Daily As Directed Disp 1 Bottle 13)  Caltrate 600+d Plus 600-400 Mg-Unit Chew (Calcium Carbonate-Vit D-Min) .Marland Kitchen.. 1 Tablet By Mouth Daily  Allergies (verified): 1)  ! Sudafed 2)  ! Sulfa 3)  Tramadol Hcl (Tramadol Hcl) PMH-FH-SH reviewed for relevance  Review of Systems General:  Denies chills and fever. CV:  Denies chest pain or discomfort and shortness of breath with exertion. GI:  Complains of diarrhea and nausea; denies abdominal pain, bloody stools, constipation, dark tarry stools, gas, loss of appetite, and vomiting.  Physical Exam  General:  Overweight, alert, well-groomed, NAD Vitals reviewed.  Abdomen:  Bowel sounds positive, abdomen soft and non-tender without masses, organomegaly or hernias noted. Psych:  Oriented X3, memory intact for recent and remote, normally interactive, good eye contact, not anxious appearing, and not depressed appearing.     Impression & Recommendations:  Problem # 1:  DIARRHEA (ICD-787.91) Assessment New Advised continued hydration. Will check stools studies. Red flags given. Orders: [facility]- Est  Level 4 (99214)Future Orders:  Stool, WBC/Lactoferrin-[facility] [phone]) ... 12/16/2010 Stool Giardia/Cryptosporidium-[facility] [phone]) ... 12/08/2010 Stool C-Diff toxin assay- [facility] ([id number]) ... 12/09/2010 Culture, Stool- [facility] [phone]) ... 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 Take 1 tablet by mouth four times a day 2)  Tegretol Xr 400 Mg Tb12 (Carbamazepine) .... Take 1 tablet by mouth twice a day 3)  Fexofenadine Hcl 180 Mg Tabs (Fexofenadine hcl) .... Take 1  tablet by mouth once a day 4)  Flonase 50 Mcg/act Susp (Fluticasone propionate) .... 2 sprays into each nostril daily for allergic rhinitis 5)  Albuterol 90 Mcg/act Aers (Albuterol) .... 2 puffs every 4 hours as needed 6)  Singulair 10 Mg Tabs (Montelukast sodium) .... One tablet daily for rhinitis 7)  Ipratropium Bromide 0.06 % Soln (Ipratropium bromide) .... 2 sprays into each nostril every 8-12 hours as needed for runny nose due to allergies 8)  Symbicort 160-4.5 Mcg/act Aero (Budesonide-formoterol fumarate) .... 2 inhalation bid 9)  Omeprazole 40 Mg Cpdr (Omeprazole) .... 2 tabs by mouth bid 10)  Desipramine Hcl 25 Mg Tabs (Desipramine hcl) .Marland Kitchen.. 1 tab by mouth at bedtime. 11)  Ibuprofen 600 Mg Tabs (Ibuprofen) .Marland Kitchen.. 1 tab by mouth q 6 hours prn for pain 12)  Selenium Sulfide 2.5 % Lotn (Selenium sulfide) .... Use daily as directed disp 1 bottle 13)  Caltrate 600+d Plus 600-400 Mg-unit Chew (Calcium carbonate-vit d-min) .Marland Kitchen.. 1 tablet by mouth daily  Patient Instructions: 1)  It was nice to meet you today! 2)  I will fill out your paperwork. 3)  We will get a stool sample from you to look for infection.

## 2010-06-27 NOTE — Miscellaneous (Signed)
Summary: Shampoo change  Clinical Lists Changes  Medications: Changed medication from SELENOS 2.25 %  SHAM (SELENIUM SULF-PYRITHIONE-UREA) Shampoo daily.  Do not use any other shampoo. to SELENIUM SULFIDE 2.5 % LOTN (SELENIUM SULFIDE) Use daily as directed disp 1 bottle - Signed Rx of SELENIUM SULFIDE 2.5 % LOTN (SELENIUM SULFIDE) Use daily as directed disp 1 bottle;  #1 x 11;  Signed;  Entered by: Ardeen Garland  MD;  Authorized by: Ardeen Garland  MD;  Method used: Electronically to [facility] # [id number]*, [address] [place], [place], [place]  [address], Ph: [phone] or [phone], Fax: [phone]    Prescriptions: SELENIUM SULFIDE 2.5 % LOTN (SELENIUM SULFIDE) Use daily as directed disp 1 bottle  #1 x 11   Entered and Authorized by:   Ardeen Garland  MD   Signed by:   Ardeen Garland  MD on 08/18/2009   Method used:   Electronically to        [facility]  [address] # [id number]* (retail)       [address]       [place], [place]  [address]       Ph: [phone] or [phone]       Fax: [phone]   RxID:   [id number]

## 2010-06-27 NOTE — Progress Notes (Signed)
Summary: triage  Phone Note Call from Patient Call back at Home Phone [phone]   Caller: Patient Summary of Call: Needs to know how long she should take the Malox for her diahrrea? Initial call taken by: Clydell Hakim,  January 06, 2010 3:32 PM  Follow-up for Phone Call        spoke with patient and she has been taking Maalox . advised her that she was instructed 2 days ago to take immodium. states the person that picked it up for her got Maalox instead . advised she doesn't need the maalox and she should get immodium to help with the diarrhea. she has had 3 diarrhea stools. today. advised to push fluids. Follow-up by: Theresia Lo RN,  January 06, 2010 4:42 PM

## 2010-06-27 NOTE — Progress Notes (Signed)
Summary: Rx Prob  Phone Note Call from Patient   Caller: Patient Summary of Call: Pt was told to get something for over the counter meds for a head cold, but she can't remember what it was. Initial call taken by: Clydell Hakim,  June 16, 2009 4:20 PM  Follow-up for Phone Call        spoke with patient and she states she has headache, face feels warm, no fever, runny nose with dark green and bloody mucus at times, occasional cough.  started 3 days ago. she saw he allergist today and she told her her lungs are fine, left nostril is irritated. started her back on  Flonase.  she is wanting to know what Dr. Georgiana Shore had said she could take OTC and per note from 06/01/2009  MD advised she can use Dayquil / Nyquil.  advised to drink plenty of fluids and rest. Follow-up by: Theresia Lo RN,  June 16, 2009 4:55 PM

## 2010-06-29 NOTE — Progress Notes (Signed)
Summary: Shampoo  Phone Note Call from Patient Call back at Home Phone [phone]   Reason for Call: Talk to Doctor Summary of Call: hair shampoo prescribed not working, wants to know if she can use? Initial call taken by: Knox Royalty,  May 25, 2010 3:49 PM  Follow-up for Phone Call        we can talk about this at her appt on 06/02/10. Follow-up by: Helane Rima DO,  May 29, 2010 4:01 PM

## 2010-06-29 NOTE — Assessment & Plan Note (Signed)
Summary: f/u  kh   Vital Signs:  Patient profile:   35 year old female Height:      61 inches Weight:      152 pounds BMI:     28.82 Temp:     98.2 degrees F oral Pulse rate:   106 / minute BP sitting:   116 / 77  (left arm) Cuff size:   regular  Vitals Entered By: Tessie Fass CMA (June 07, 2010 1:45 PM) CC: F/U from last visit   Primary Care Provider:  Helane Rima DO  CC:  F/U from last visit.  History of Present Illness: 35 yo F:  1. Cough: Improved, intermittent, dry, associated with nasal rhinitis/runny nose. Denies HA, dizziness, vision changes, sore throat, CP,SOB, N/V/D/C, fever. She endorses sick contacts at [facility]. + FluVax. Hx of severe allergies - followed by Allergist.  2. Vaginal Bleed: Started Depo in October. Hx of 1-2 week menses (light to heavy) x 2 months. No bleeding today. Denies CP, SOB, dizziness. Not sexually active. No vaginal discharge. Hgb = 10.5 on 04/12/10, MCV = 86.5.  Current Medications (verified): 1)  Depakote 250 Mg Tbec (Divalproex Sodium) .... Take 1 Tablet By Mouth Four Times A Day 2)  Tegretol Xr 400 Mg Tb12 (Carbamazepine) .... Take 1 Tablet By Mouth Twice A Day 3)  Zyrtec Hives Relief 10 Mg Tabs (Cetirizine Hcl) .... One By Mouth Daily 4)  Flonase 50 Mcg/act  Susp (Fluticasone Propionate) .Marland Kitchen.. 1 Sprays Into Each Nostril Daily For Allergic Rhinitis 5)  Albuterol 90 Mcg/act Aers (Albuterol) .... 2 Puffs Every 4 Hours As Needed 6)  Singulair 10 Mg  Tabs (Montelukast Sodium) .... One Tablet Daily For Rhinitis 7)  Symbicort 160-4.5 Mcg/act Aero (Budesonide-Formoterol Fumarate) .... 2 Inhalation Bid 8)  Ibuprofen 600 Mg  Tabs (Ibuprofen) .Marland Kitchen.. 1 Tab By Mouth Q 6 Hours Prn For Pain 9)  Selenium Sulfide 2.5 % Lotn (Selenium Sulfide) .... Use Daily As Directed Disp 1 Bottle 10)  Omeprazole 40 Mg Cpdr (Omeprazole) .... One Tab in Am, One Tab in The Pm X 2 Weeks, Then One Tab Daily 11)  Depo-Provera 150 Mg/ml Susp (Medroxyprogesterone  Acetate) .... Q 3 Months, Per Protocol 12)  Miralax   Powd (Polyethylene Glycol 3350) .Marland Kitchen.. 17g By Mouth Once Daily As Needed Constipation - Per Gi  Allergies (verified): 1)  ! Sudafed 2)  ! Sulfa 3)  Tramadol Hcl (Tramadol Hcl) PMH-FH-SH reviewed for relevance  Review of Systems      See HPI  Physical Exam  General:  VS noted.  Well, NAD. Eyes:  No injection. Ears:  R ear normal and L ear normal.   Nose:  Mild nasal discharge, mucosal pallor.   Mouth:  MMM. Neck:  No deformities, masses, or tenderness noted. Lungs:  Normal WOB, no wheezing or crackles, good air movement.  Heart:  Normal rate and regular rhythm.    Impression & Recommendations:  Problem # 1:  ALLERGIC RHINITIS (ICD-477.9) Assessment Unchanged  Continue current treatment. Explained that she may be picking up viral illness at [facility]. Advised healthy diet, regular exercise, regular sleep. Wash hands often. Provided masks for patient to take to Center if it makes her feel better. Her updated medication list for this problem includes:    Zyrtec Hives Relief 10 Mg Tabs (Cetirizine hcl) ..... One by mouth daily    Flonase 50 Mcg/act Susp (Fluticasone propionate) .Marland Kitchen... 1 sprays into each nostril daily for allergic rhinitis  Orders: [facility]- Est Level  3 (82956)  Problem # 2:  EXCESSIVE MENSTRUAL BLEEDING (ICD-626.2) Assessment: Unchanged Will give Depo today. Will follow - hopefully, menses to stop with medication. CBC today. Her updated medication list for this problem includes:    Depo-provera 150 Mg/ml Susp (Medroxyprogesterone acetate) ..... Q 3 months, per protocol  Orders: CBC-[facility] (21308) Depo-Provera 150mg  (J1055) [facility]- Est Level  3 (65784)  Complete Medication List: 1)  Depakote 250 Mg Tbec (Divalproex sodium) .... Take 1 tablet by mouth four times a day 2)  Tegretol Xr 400 Mg Tb12 (Carbamazepine) .... Take 1 tablet by mouth twice a day 3)  Zyrtec Hives Relief 10 Mg Tabs (Cetirizine hcl) .... One by mouth  daily 4)  Flonase 50 Mcg/act Susp (Fluticasone propionate) .Marland Kitchen.. 1 sprays into each nostril daily for allergic rhinitis 5)  Albuterol 90 Mcg/act Aers (Albuterol) .... 2 puffs every 4 hours as needed 6)  Singulair 10 Mg Tabs (Montelukast sodium) .... One tablet daily for rhinitis 7)  Symbicort 160-4.5 Mcg/act Aero (Budesonide-formoterol fumarate) .... 2 inhalation bid 8)  Ibuprofen 600 Mg Tabs (Ibuprofen) .Marland Kitchen.. 1 tab by mouth q 6 hours prn for pain 9)  Selenium Sulfide 2.5 % Lotn (Selenium sulfide) .... Use daily as directed disp 1 bottle 10)  Omeprazole 40 Mg Cpdr (Omeprazole) .... One tab in am, one tab in the pm x 2 weeks, then one tab daily 11)  Depo-provera 150 Mg/ml Susp (Medroxyprogesterone acetate) .... Q 3 months, per protocol 12)  Miralax Powd (Polyethylene glycol 3350) .Marland Kitchen.. 17g by mouth once daily as needed constipation - per gi  Patient Instructions: 1)  It was nice to see you today!   Medication Administration  Injection # 1:    Medication: Depo-Provera 150mg     Diagnosis: EXCESSIVE MENSTRUAL BLEEDING (ICD-626.2)    Route: IM    Site: L deltoid    Exp Date: 09/25/2012    Lot #: [id number]    Mfr: [email or web address]    Comments: NEXT DEPO DUE 09-24-10 [facility] 10-08-10. REMINDER CARD GIVEN TO PT.    Patient tolerated injection without complications    Given by: Arlyss Repress CMA, (June 07, 2010 2:09 PM)  Orders Added: 1)  CBC-[facility] [85027] 2)  Depo-Provera 150mg  [J1055] 3)  [facility]- Est Level  3 [99213]     Medication Administration  Injection # 1:    Medication: Depo-Provera 150mg     Diagnosis: EXCESSIVE MENSTRUAL BLEEDING (ICD-626.2)    Route: IM    Site: L deltoid    Exp Date: 09/25/2012    Lot #: [id number]    Mfr: [email or web address]    Comments: NEXT DEPO DUE 09-24-10 [facility] 10-08-10. REMINDER CARD GIVEN TO PT.    Patient tolerated injection without complications    Given by: Arlyss Repress CMA, (June 07, 2010 2:09 PM)  Orders Added: 1)  CBC-[facility] [85027] 2)  Depo-Provera 150mg   [J1055] 3)  [facility]- Est Level  3 [44010]

## 2010-06-29 NOTE — Assessment & Plan Note (Signed)
Summary: not feeling well/fever/eo   Vital Signs:  Patient profile:   35 year old female Height:      61 inches Weight:      148 pounds BMI:     28.07 O2 Sat:      100 % Temp:     98.2 degrees F oral Pulse rate:   95 / minute BP sitting:   118 / 76  (left arm) Cuff size:   regular  Vitals Entered By: Tessie Fass CMA (May 31, 2010 1:55 PM) CC: dyspnea, cough and congestion   Primary Care Provider:  Helane Rima DO  CC:  dyspnea and cough and congestion.  History of Present Illness: 35 yo F:  1. CoughI: Dry, associated with nasal rhinitis/runny nose. Denies HA, dizziness, vision changes, sore throat, CP,SOB, N/V/D/C, fever. She endorses sick contacts. + FluVax. Hx of severe allergies - followed by Allergist.  2. Jaw Pain: Bilateral, x several days, denies bruxism.  Current Medications (verified): 1)  Depakote 250 Mg Tbec (Divalproex Sodium) .... Take 1 Tablet By Mouth Four Times A Day 2)  Tegretol Xr 400 Mg Tb12 (Carbamazepine) .... Take 1 Tablet By Mouth Twice A Day 3)  Zyrtec Hives Relief 10 Mg Tabs (Cetirizine Hcl) .... One By Mouth Daily 4)  Flonase 50 Mcg/act  Susp (Fluticasone Propionate) .Marland Kitchen.. 1 Sprays Into Each Nostril Daily For Allergic Rhinitis 5)  Albuterol 90 Mcg/act Aers (Albuterol) .... 2 Puffs Every 4 Hours As Needed 6)  Singulair 10 Mg  Tabs (Montelukast Sodium) .... One Tablet Daily For Rhinitis 7)  Symbicort 160-4.5 Mcg/act Aero (Budesonide-Formoterol Fumarate) .... 2 Inhalation Bid 8)  Ibuprofen 600 Mg  Tabs (Ibuprofen) .Marland Kitchen.. 1 Tab By Mouth Q 6 Hours Prn For Pain 9)  Selenium Sulfide 2.5 % Lotn (Selenium Sulfide) .... Use Daily As Directed Disp 1 Bottle 10)  Omeprazole 40 Mg Cpdr (Omeprazole) .... One Tab in Am, One Tab in The Pm X 2 Weeks, Then One Tab Daily 11)  Depo-Provera 150 Mg/ml Susp (Medroxyprogesterone Acetate) .... Q 3 Months, Per Protocol 12)  Miralax   Powd (Polyethylene Glycol 3350) .Marland Kitchen.. 17g By Mouth Once Daily As Needed Constipation - Per  Gi  Allergies (verified): 1)  ! Sudafed 2)  ! Sulfa 3)  Tramadol Hcl (Tramadol Hcl) PMH-FH-SH reviewed for relevance  Review of Systems      See HPI  Physical Exam  General:  VS noted.  Well, NAD. Eyes:  No injection. Ears:  R ear normal and L ear normal.   Nose:  Mild nasal discharge, mucosal pallor.   Mouth:  MMM. Neck:  No deformities, masses, or tenderness noted. Lungs:  Normal WOB, no wheezing or crackles, good air movement.  Heart:  Normal rate and regular rhythm.  Msk:  Jaw - + TMJ click. No locking.   Impression & Recommendations:  Problem # 1:  ALLERGIC RHINITIS (ICD-477.9) Assessment Unchanged  No red flags. Continue current treatment.  The following medications were removed from the medication list:    Ipratropium Bromide 0.06 % Soln (Ipratropium bromide) .Marland Kitchen... 2 sprays into each nostril every 8-12 hours as needed for runny nose due to allergies Her updated medication list for this problem includes:    Zyrtec Hives Relief 10 Mg Tabs (Cetirizine hcl) ..... One by mouth daily    Flonase 50 Mcg/act Susp (Fluticasone propionate) .Marland Kitchen... 1 sprays into each nostril daily for allergic rhinitis  Orders: [facility]- Est Level  3 (47829)  Problem # 2:  TEMPOROMANDIBULAR JOINT  PAIN (ICD-524.62) Assessment: New  Advised patient to call dentist for evaluation and possible mouth gaurd. No red flags. Rx NSAIDs and precautions given (no chewing gum, etc).  Orders: [facility]- Est Level  3 ([address])  Complete Medication List: 1)  Depakote 250 Mg Tbec (Divalproex sodium) .... Take 1 tablet by mouth four times a day 2)  Tegretol Xr 400 Mg Tb12 (Carbamazepine) .... Take 1 tablet by mouth twice a day 3)  Zyrtec Hives Relief 10 Mg Tabs (Cetirizine hcl) .... One by mouth daily 4)  Flonase 50 Mcg/act Susp (Fluticasone propionate) .Marland Kitchen.. 1 sprays into each nostril daily for allergic rhinitis 5)  Albuterol 90 Mcg/act Aers (Albuterol) .... 2 puffs every 4 hours as needed 6)  Singulair 10 Mg Tabs  (Montelukast sodium) .... One tablet daily for rhinitis 7)  Symbicort 160-4.5 Mcg/act Aero (Budesonide-formoterol fumarate) .... 2 inhalation bid 8)  Ibuprofen 600 Mg Tabs (Ibuprofen) .Marland Kitchen.. 1 tab by mouth q 6 hours prn for pain 9)  Selenium Sulfide 2.5 % Lotn (Selenium sulfide) .... Use daily as directed disp 1 bottle 10)  Omeprazole 40 Mg Cpdr (Omeprazole) .... One tab in am, one tab in the pm x 2 weeks, then one tab daily 11)  Depo-provera 150 Mg/ml Susp (Medroxyprogesterone acetate) .... Q 3 months, per protocol 12)  Miralax Powd (Polyethylene glycol 3350) .Marland Kitchen.. 17g by mouth once daily as needed constipation - per gi  Patient Instructions: 1)  It was nice to see you today! 2)  Make an appointment with your dentist to talk about a mouth guard.   Orders Added: 1)  [facility]- Est Level  3 [60454]

## 2010-06-29 NOTE — Progress Notes (Signed)
Summary: Triage  Phone Note Call from Patient Call back at Home Phone [phone]   Summary of Call: pt has appt on wed, is bleeding heavy & has been off and on since 12/22, wants to know if she should come in sooner? or will she be ok to wait until wed? Initial call taken by: Knox Royalty,  June 05, 2010 2:36 PM  Follow-up for Phone Call        changing pads every 3-4 hours. started period 05/18/10.  had 11 day period in november. feels weak off & on per pt. states she is ok right now. has been drinking lots of water & orange juice & navy beans & bananas started depo in October. told her periods may change at first. keep appt wed & continue good diet. she agreed with plan & will call back if any changes Follow-up by: Golden Circle RN,  June 05, 2010 4:49 PM

## 2010-06-29 NOTE — Assessment & Plan Note (Signed)
Summary: f/u allergies/congested/bmc   Vital Signs:  Patient profile:   35 year old female Height:      61 inches Weight:      149 pounds BMI:     28.26 Temp:     98.0 degrees F oral Pulse rate:   98 / minute BP sitting:   113 / 76  (left arm) Cuff size:   regular  Vitals Entered By: Tessie Fass CMA (June 14, 2010 3:05 PM) CC: cough and congestion Pain Assessment Patient in pain? yes     Location: chest, abdomen Intensity: 9   Primary Care Provider:  Helane Rima DO  CC:  cough and congestion.  History of Present Illness: 35 yo F:  1. Cough: Improved, intermittent, productive of green sputum, associated with rhinitis. Now with intermittent wheeze. Denies HA, dizziness, vision changes, sore throat, CP,SOB, N/V/D/C, fever. + FluVax. Hx of severe allergies - followed by Allergist.  2. BRBPR: Hx of, a few days ago, streak of blood with BM. None since. No abdominal pain, melena. Hx of hemorrhoids. She later says that she is not sure i it came from urine.    Current Medications (verified): 1)  Depakote 250 Mg Tbec (Divalproex Sodium) .... Take 1 Tablet By Mouth Four Times A Day 2)  Tegretol Xr 400 Mg Tb12 (Carbamazepine) .... Take 1 Tablet By Mouth Twice A Day 3)  Zyrtec Hives Relief 10 Mg Tabs (Cetirizine Hcl) .... One By Mouth Daily 4)  Flonase 50 Mcg/act  Susp (Fluticasone Propionate) .Marland Kitchen.. 1 Sprays Into Each Nostril Daily For Allergic Rhinitis 5)  Albuterol 90 Mcg/act Aers (Albuterol) .... 2 Puffs Every 4 Hours As Needed 6)  Singulair 10 Mg  Tabs (Montelukast Sodium) .... One Tablet Daily For Rhinitis 7)  Symbicort 160-4.5 Mcg/act Aero (Budesonide-Formoterol Fumarate) .... 2 Inhalation Bid 8)  Ibuprofen 600 Mg  Tabs (Ibuprofen) .Marland Kitchen.. 1 Tab By Mouth Q 6 Hours Prn For Pain 9)  Selenium Sulfide 2.5 % Lotn (Selenium Sulfide) .... Use Daily As Directed Disp 1 Bottle 10)  Omeprazole 40 Mg Cpdr (Omeprazole) .... One Tab in Am, One Tab in The Pm X 2 Weeks, Then One Tab  Daily 11)  Depo-Provera 150 Mg/ml Susp (Medroxyprogesterone Acetate) .... Q 3 Months, Per Protocol 12)  Miralax   Powd (Polyethylene Glycol 3350) .Marland Kitchen.. 17g By Mouth Once Daily As Needed Constipation - Per Gi 13)  Amoxicillin 500 Mg Tabs (Amoxicillin) .... One By Mouth Two Times A Day X 7 Days  Allergies (verified): 1)  ! Sudafed 2)  ! Sulfa 3)  Tramadol Hcl (Tramadol Hcl) PMH-FH-SH reviewed for relevance  Review of Systems      See HPI  Physical Exam  General:  VS noted.  Well, NAD. Eyes:  No injection. Ears:  R ear normal and L ear normal.   Nose:  Mild nasal discharge, mucosal pallor.   Mouth:  MMM. Neck:  No deformities, masses, or tenderness noted. Lungs:  Normal WOB, no crackles, good air movement. Slight expiratory wheeze with forceful exhalation. RLL ronchi cleared with cough. Heart:  Normal rate and regular rhythm.  Rectal:  No external abnormalities noted. Normal sphincter tone. No rectal masses or tenderness. FOB negative. Pulses:  2+ radial and dp pulses. Extremities:  warm and well perfused   Impression & Recommendations:  Problem # 1:  COUGH (ICD-786.2) No red flags. Cough is likely allergic or viral, but will treat with Abx based on length of cough. Orders: [facility]- Est  Level 4 (52841)  Problem # 2:  HEMATURIA UNSPECIFIED (ICD-599.70) Assessment: New No blood. Her updated medication list for this problem includes:    Amoxicillin 500 Mg Tabs (Amoxicillin) ..... One by mouth two times a day x 7 days  Orders: Urinalysis-[facility] (00000) [facility]- Est  Level 4 (04540)  Problem # 3:  RECTAL BLEEDING, HX OF (ICD-V12.79) Assessment: New FOB negative. Orders: [facility]- Est  Level 4 (99214)  Complete Medication List: 1)  Depakote 250 Mg Tbec (Divalproex sodium) .... Take 1 tablet by mouth four times a day 2)  Tegretol Xr 400 Mg Tb12 (Carbamazepine) .... Take 1 tablet by mouth twice a day 3)  Zyrtec Hives Relief 10 Mg Tabs (Cetirizine hcl) .... One by mouth daily 4)  Flonase  50 Mcg/act Susp (Fluticasone propionate) .Marland Kitchen.. 1 sprays into each nostril daily for allergic rhinitis 5)  Albuterol 90 Mcg/act Aers (Albuterol) .... 2 puffs every 4 hours as needed 6)  Singulair 10 Mg Tabs (Montelukast sodium) .... One tablet daily for rhinitis 7)  Symbicort 160-4.5 Mcg/act Aero (Budesonide-formoterol fumarate) .... 2 inhalation bid 8)  Ibuprofen 600 Mg Tabs (Ibuprofen) .Marland Kitchen.. 1 tab by mouth q 6 hours prn for pain 9)  Selenium Sulfide 2.5 % Lotn (Selenium sulfide) .... Use daily as directed disp 1 bottle 10)  Omeprazole 40 Mg Cpdr (Omeprazole) .... One tab in am, one tab in the pm x 2 weeks, then one tab daily 11)  Depo-provera 150 Mg/ml Susp (Medroxyprogesterone acetate) .... Q 3 months, per protocol 12)  Miralax Powd (Polyethylene glycol 3350) .Marland Kitchen.. 17g by mouth once daily as needed constipation - per gi 13)  Amoxicillin 500 Mg Tabs (Amoxicillin) .... One by mouth two times a day x 7 days  Patient Instructions: 1)  It was nice to see you today! Prescriptions: AMOXICILLIN 500 MG TABS (AMOXICILLIN) ONE by mouth two times a day x 7 days  #14 x 0   Entered and Authorized by:   Helane Rima DO   Signed by:   Helane Rima DO on 06/14/2010   Method used:   Electronically to        [id number] (retail)       [address]       [place], [place]  [address]       Ph: [phone] or [phone]       Fax: [phone]   RxID:   [id number]    Orders Added: 1)  Urinalysis-[facility] [00000] 2)  [facility]- Est  Level 4 [47425]    Laboratory Results   Urine Tests  Date/Time Received: June 14, 2010 3:35 PM  Date/Time Reported: June 14, 2010 4:01 PM   Routine Urinalysis   Color: coffee/tea colored Appearance: Clear Glucose: negative   (Normal Range: Negative) Bilirubin: large;  reflex ictotest = positive   (Normal Range: Negative) Ketone: negative   (Normal Range: Negative) Spec. Gravity: 1.020   (Normal Range: 1.003-1.035) Blood:  negative   (Normal Range: Negative) pH: 5.5   (Normal Range: 5.0-8.0) Protein: negative   (Normal Range: Negative) Urobilinogen: 0.2   (Normal Range: 0-1) Nitrite: negative   (Normal Range: Negative) Leukocyte Esterace: negative   (Normal Range: Negative)    Comments: .........Marland Kitchenbiochemical negative; microscopic not indicated ...............test performed by......Marland KitchenBonnie A. [place], MLS (ASCP)cm

## 2010-06-29 NOTE — Progress Notes (Signed)
Summary: UPDATE PROBLEM LIST   

## 2010-07-05 NOTE — Consult Note (Signed)
Summary: Allergy & Asthma  Allergy & Asthma   Imported By: De Nurse 06/27/2010 15:08:30  _____________________________________________________________________  External Attachment:    Type:   Image     Comment:   External Document

## 2010-07-10 ENCOUNTER — Telehealth: Payer: Self-pay | Admitting: Family Medicine

## 2010-07-12 ENCOUNTER — Encounter: Payer: Self-pay | Admitting: Family Medicine

## 2010-07-12 NOTE — Telephone Encounter (Signed)
Letter composed and sent to [employer].

## 2010-07-17 ENCOUNTER — Telehealth: Payer: Self-pay | Admitting: Family Medicine

## 2010-07-17 NOTE — Telephone Encounter (Signed)
Left message asking her to call me back

## 2010-07-17 NOTE — Telephone Encounter (Signed)
States flow amount varies. States she feels weak & tired. Says she has a low blood count. Unable to get in any earlier than her appt on Thursday. Medicaid transportation requires a 3 day notice.  States her urine is brown-red. Dr. Matthias Hughs told her it is because of her liver function( GI).  Says she cannot eat red meat according to "my seizure doctor" told her to keep appt on Thursday. Call back if problems before then

## 2010-07-19 NOTE — Telephone Encounter (Signed)
Pt states she got the letter .Marland KitchenFaustino Congress

## 2010-07-20 ENCOUNTER — Ambulatory Visit (INDEPENDENT_AMBULATORY_CARE_PROVIDER_SITE_OTHER): Payer: Medicaid Other | Admitting: Family Medicine

## 2010-07-20 ENCOUNTER — Encounter: Payer: Self-pay | Admitting: Family Medicine

## 2010-07-20 VITALS — BP 103/73 | HR 83 | Wt 145.0 lb

## 2010-07-20 DIAGNOSIS — D649 Anemia, unspecified: Secondary | ICD-10-CM

## 2010-07-20 DIAGNOSIS — N920 Excessive and frequent menstruation with regular cycle: Secondary | ICD-10-CM

## 2010-07-20 LAB — CONVERTED CEMR LAB
HCT: 32.2 % — ABNORMAL LOW (ref 36.0–46.0)
MCV: 87 fL (ref 78.0–100.0)
Platelets: 153 10*3/uL (ref 150–400)
WBC: 3.9 10*3/uL — ABNORMAL LOW (ref 4.0–10.5)

## 2010-07-20 LAB — CBC
Hemoglobin: 10.9 g/dL — ABNORMAL LOW (ref 12.0–15.0)
MCH: 29.5 pg (ref 26.0–34.0)
RBC: 3.7 MIL/uL — ABNORMAL LOW (ref 3.87–5.11)

## 2010-07-20 MED ORDER — NORGESTIMATE-ETH ESTRADIOL 0.25-35 MG-MCG PO TABS: 1.0000 | ORAL_TABLET | Freq: Every day | ORAL | Status: DC

## 2010-07-20 NOTE — Patient Instructions (Addendum)
It was nice to see you today!  I am starting a medication to help with vaginal bleeding.

## 2010-07-21 ENCOUNTER — Telehealth: Payer: Self-pay | Admitting: *Deleted

## 2010-07-21 ENCOUNTER — Other Ambulatory Visit: Payer: Self-pay | Admitting: Family Medicine

## 2010-07-21 DIAGNOSIS — D649 Anemia, unspecified: Secondary | ICD-10-CM

## 2010-07-21 MED ORDER — FERROUS SULFATE 325 (65 FE) MG PO TABS: 325.0000 mg | ORAL_TABLET | Freq: Every day | ORAL | Status: DC

## 2010-07-21 NOTE — Telephone Encounter (Signed)
Called pt and she will take the iron tabs (at the pharmacy) Pt is concerned because she is bleeding again. Advised to call us, if bleeding get worse. She is currently using up to 4 pads daily. Pt wants Dr.Wallace to be aware of it. Will fwd. To Dr.Wallace

## 2010-07-23 ENCOUNTER — Encounter: Payer: Self-pay | Admitting: Family Medicine

## 2010-07-23 NOTE — Assessment & Plan Note (Signed)
Will start OCPs for estrogen effect. No red flags today. Will recheck in 1-2 months - if not improving, may need endometrial biopsy. Check Hgb today. Advised FeSO4 supplements restarted.

## 2010-07-23 NOTE — Assessment & Plan Note (Signed)
Check Hgb. FeSO4.

## 2010-07-23 NOTE — Progress Notes (Signed)
  Subjective:    Patient ID: [name], female    DOB: Sep 03, 1975, 35 y.o.   MRN: [id number]  HPI 1. Vaginal Bleed: Started Depo in October. Hx of 1-2 week menses (light to heavy) x 3-4 months. No bleeding today. Denies CP, SOB, dizziness. Has never been sexually active. No vaginal discharge or abdominal pain. Hgb = 10.2 on 06/07/10.   Review of Systems See HPI.    Objective:   Physical Exam  Constitutional: Vital signs are normal. She appears well-developed and well-nourished. No distress.  Cardiovascular: Normal rate, regular rhythm, S1 normal, S2 normal, normal heart sounds and normal pulses.   No murmur heard. Pulmonary/Chest: Effort normal and breath sounds normal.  Neurological: She is alert.  Skin: No pallor.          Assessment & Plan:

## 2010-08-01 ENCOUNTER — Telehealth: Payer: Self-pay | Admitting: Family Medicine

## 2010-08-01 NOTE — Telephone Encounter (Addendum)
Patient states she missed one pill yesterday and she took her usual  dose today at 7:00 AM, advised she can go ahead and take another today to get back on track and take her usual dose tomorrow at usual time. Advised to use extra protection such as condom if she does have sex. States she takes pills because of her periods and not for birth control.Marland Kitchen

## 2010-08-01 NOTE — Telephone Encounter (Signed)
Pt needs to speak with RN about missing a dose of her birth control.

## 2010-08-01 NOTE — Telephone Encounter (Signed)
Returned call, no answer.  Will try back in a few minutes.

## 2010-08-01 NOTE — Telephone Encounter (Signed)
Still no answer when I call her.  Have tried multiple times throughout the day.

## 2010-08-01 NOTE — Telephone Encounter (Signed)
Patient called back earlier and RN was unable to speak with her at that time. Attempted call back now and message again left to call back.

## 2010-08-16 ENCOUNTER — Ambulatory Visit (INDEPENDENT_AMBULATORY_CARE_PROVIDER_SITE_OTHER): Payer: Medicaid Other | Admitting: Family Medicine

## 2010-08-16 ENCOUNTER — Encounter: Payer: Self-pay | Admitting: Family Medicine

## 2010-08-16 ENCOUNTER — Other Ambulatory Visit: Payer: Self-pay | Admitting: Family Medicine

## 2010-08-16 VITALS — BP 118/78 | HR 72 | Temp 98.0°F | Wt 148.8 lb

## 2010-08-16 DIAGNOSIS — N920 Excessive and frequent menstruation with regular cycle: Secondary | ICD-10-CM

## 2010-08-16 DIAGNOSIS — M542 Cervicalgia: Secondary | ICD-10-CM

## 2010-08-16 DIAGNOSIS — L989 Disorder of the skin and subcutaneous tissue, unspecified: Secondary | ICD-10-CM

## 2010-08-16 LAB — CBC
HCT: 32.2 % — ABNORMAL LOW (ref 36.0–46.0)
MCHC: 33.5 g/dL (ref 30.0–36.0)
MCV: 86.6 fL (ref 78.0–100.0)
RDW: 14 % (ref 11.5–15.5)

## 2010-08-16 NOTE — Patient Instructions (Signed)
You may take Tylenol or Ibuprofen for your pain.  Keep the spot on you right leg covered until I see it next month.  We are getting on Ultrasound to see if we can tell why you are bleeding so much.

## 2010-08-16 NOTE — Assessment & Plan Note (Signed)
No red flags and seems to be mild. Discussed medications to try OTC as Veronda has not tried anything yet for relief. Also demonstrated neck stretches to do at home.

## 2010-08-16 NOTE — Progress Notes (Signed)
  Subjective:    Patient ID: [name], female    DOB: 1975/09/30, 35 y.o.   MRN: [id number]  HPI  1. Vaginal Bleeding: Patient endorses excessive vaginal bleeding over several months. Was started on Depo Provera x several months ago without improvement. Was started on OCP last month without improvement. [name] states that she has bright red blood, soaking 3-4 pads per day from March 1 to March 17 and is starting to spot again. Hgb has been stable (10.2 to 10.9).   2. Neck Pain: x 2 days, started on left and now on right, feels tight, no injury or overuse, now associated with HA. No dizziness, vision changes, neck stiffness, neurological deificits, or fever.  3. Skin Lesion: States that she had a bump on her right proximal knee x 1 month ago that keeps rubbing on her clothes and hurts. No evidence of infection, but not healing quickly.   Review of Systems SEE HPI.    Objective:   Physical Exam  Constitutional: She is oriented to person, place, and time. She appears well-nourished. No distress.  HENT:  Head: Normocephalic and atraumatic.  Eyes: EOM are normal. Pupils are equal, round, and reactive to light.  Neck: Normal range of motion. Neck supple.  Cardiovascular: Normal rate and regular rhythm.   Pulmonary/Chest: Effort normal and breath sounds normal.  Abdominal: Soft. Bowel sounds are normal. She exhibits no distension and no mass. There is no tenderness. There is no rebound and no guarding.  Neurological: She is alert and oriented to person, place, and time. She has normal reflexes.  Psychiatric: She has a normal mood and affect.          Assessment & Plan:

## 2010-08-16 NOTE — Assessment & Plan Note (Signed)
It is difficult to identify lesion today and it seems to be irritated from rubbing against her clothes. It is dark, < 1cm diameter, with a developing horn. I asked Aveah to keep it covered and to let me evaluate it again next month. If it looks the same, I may biopsy the area.

## 2010-08-16 NOTE — Assessment & Plan Note (Signed)
Unclear etiology at this point. Continue OCP. Hgb has been stable - will recheck. Will obtain pelvic/transvaginal US.

## 2010-08-23 ENCOUNTER — Telehealth: Payer: Self-pay | Admitting: Family Medicine

## 2010-08-23 NOTE — Telephone Encounter (Signed)
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  She is still having problems with condition.

## 2010-08-25 ENCOUNTER — Ambulatory Visit (HOSPITAL_COMMUNITY)
Admission: RE | Admit: 2010-08-25 | Discharge: 2010-08-25 | Disposition: A | Discharge status: Home / Self Care | Payer: Medicaid Other | Source: Ambulatory Visit | Attending: Family Medicine | Admitting: Family Medicine

## 2010-08-25 DIAGNOSIS — N938 Other specified abnormal uterine and vaginal bleeding: Secondary | ICD-10-CM | POA: Insufficient documentation

## 2010-08-25 DIAGNOSIS — N949 Unspecified condition associated with female genital organs and menstrual cycle: Secondary | ICD-10-CM | POA: Insufficient documentation

## 2010-08-25 DIAGNOSIS — N920 Excessive and frequent menstruation with regular cycle: Secondary | ICD-10-CM

## 2010-08-25 NOTE — Telephone Encounter (Signed)
She has already seen a dentist about this issue. Please let her know that we will discuss this at her next visit.

## 2010-09-12 ENCOUNTER — Encounter: Payer: Self-pay | Admitting: Family Medicine

## 2010-09-12 ENCOUNTER — Ambulatory Visit (INDEPENDENT_AMBULATORY_CARE_PROVIDER_SITE_OTHER): Payer: Medicaid Other | Admitting: Family Medicine

## 2010-09-12 VITALS — BP 110/74 | Temp 98.4°F | Ht 61.0 in | Wt 145.0 lb

## 2010-09-12 DIAGNOSIS — R109 Unspecified abdominal pain: Secondary | ICD-10-CM

## 2010-09-12 DIAGNOSIS — J309 Allergic rhinitis, unspecified: Secondary | ICD-10-CM

## 2010-09-12 DIAGNOSIS — E559 Vitamin D deficiency, unspecified: Secondary | ICD-10-CM

## 2010-09-12 DIAGNOSIS — R300 Dysuria: Secondary | ICD-10-CM

## 2010-09-12 DIAGNOSIS — R102 Pelvic and perineal pain: Secondary | ICD-10-CM

## 2010-09-12 LAB — POCT URINALYSIS DIPSTICK
Blood, UA: NEGATIVE
Glucose, UA: NEGATIVE
Ketones, UA: NEGATIVE
Leukocytes, UA: NEGATIVE
Nitrite, UA: NEGATIVE
Protein, UA: NEGATIVE
Spec Grav, UA: 1.015
Urobilinogen, UA: 0.2
pH, UA: 6.5

## 2010-09-12 NOTE — Patient Instructions (Signed)
It was nice to see you today!  We are checking labs and will call you with the results.

## 2010-09-13 ENCOUNTER — Telehealth: Payer: Self-pay | Admitting: Family Medicine

## 2010-09-13 ENCOUNTER — Encounter: Payer: Self-pay | Admitting: Family Medicine

## 2010-09-13 DIAGNOSIS — E559 Vitamin D deficiency, unspecified: Secondary | ICD-10-CM | POA: Insufficient documentation

## 2010-09-13 DIAGNOSIS — R102 Pelvic and perineal pain: Secondary | ICD-10-CM | POA: Insufficient documentation

## 2010-09-13 LAB — VITAMIN D 25 HYDROXY (VIT D DEFICIENCY, FRACTURES): Vit D, 25-Hydroxy: <10 ng/mL — ABNORMAL LOW (ref 30–89)

## 2010-09-13 MED ORDER — ERGOCALCIFEROL 1.25 MG (50000 UT) PO CAPS: 50000.0000 [IU] | ORAL_CAPSULE | ORAL | Status: DC

## 2010-09-13 MED ORDER — CEPHALEXIN 500 MG PO CAPS: 500.0000 mg | ORAL_CAPSULE | Freq: Two times a day (BID) | ORAL | Status: AC

## 2010-09-13 NOTE — Assessment & Plan Note (Signed)
No red flags. Will treat based on symptoms. UA pending.

## 2010-09-13 NOTE — Progress Notes (Signed)
  Subjective:    Patient ID: [name], female    DOB: 04-Jun-1975, 35 y.o.   MRN: [id number]  HPI  1. Abdominal Pain: x 2 weeks. Suprapubic. Mild-Mod. Described as cramping and sometimes sharp. Denies fever/chills, N/V/D/C. Endorses "stinging" when she urinates. Patient is not sexually active. No vaginal DC. Hx of abnormal LFTs, chronic cholecystitis - followed by Dr. Matthias Hughs. Has upcoming appointment. She does not feel that this is her gallbladder.  2. Allergies: Patient seen by Allergist. On prednisone burst. She is on them frequently. Will check vitamin D and recommend daily calcium + vitamin D.  Review of Systems SEE HPI.    Objective:   Physical Exam  Vitals reviewed. Constitutional: She appears well-developed and well-nourished. No distress.  Cardiovascular: Normal rate, normal heart sounds and intact distal pulses.   Pulmonary/Chest: Effort normal and breath sounds normal.  Abdominal: Soft. Bowel sounds are normal. She exhibits no distension and no mass. There is tenderness. There is no rebound and no guarding.       Mild TTP suprapubic, RUQ.       Assessment & Plan:

## 2010-09-13 NOTE — Telephone Encounter (Signed)
Pt asking for results from yesterday.

## 2010-09-13 NOTE — Assessment & Plan Note (Signed)
See HPI

## 2010-09-13 NOTE — Telephone Encounter (Signed)
Pt asked for her lab results & [place] results. Told her Vit D was low. She is not taking any at this time. Told her to wait for md to speak with her about this & she will let her know what she should do. md will also address [place]. She has an appt 09/19/10. She had no further questions

## 2010-09-13 NOTE — Telephone Encounter (Signed)
Spoke with patient. Treating UTI based on symptoms. Sent in high dose vitamin D. Patient to come back in if not improved be next week.

## 2010-09-15 ENCOUNTER — Telehealth: Payer: Self-pay | Admitting: Family Medicine

## 2010-09-15 NOTE — Telephone Encounter (Signed)
Forward to Wallace 

## 2010-09-15 NOTE — Telephone Encounter (Signed)
I faxed the part of the sheet that showed the 2 new meds to the [facility] [place]-attn to the rn. Pt gave me the numbers

## 2010-09-15 NOTE — Telephone Encounter (Signed)
Pt brought in new Rxs, Keflex & Vitamin D2 1.25 mg., they need doctors orders for these Rxs. Joyce Gross fills pts bill box every week & administers her meds while she is there.

## 2010-09-15 NOTE — Telephone Encounter (Signed)
To be able [facility] they are requiring official orders to keep there on file since these are new meds.

## 2010-09-15 NOTE — Telephone Encounter (Signed)
I don't understand request. I sent Rx to pharmacy, which means that I ORDERED them. I am not in clinic today, so if she needs a verbal order, please send back message with phone number that I need to call.

## 2010-09-19 ENCOUNTER — Ambulatory Visit (INDEPENDENT_AMBULATORY_CARE_PROVIDER_SITE_OTHER): Payer: Medicaid Other | Admitting: Family Medicine

## 2010-09-19 ENCOUNTER — Encounter: Payer: Self-pay | Admitting: Family Medicine

## 2010-09-19 DIAGNOSIS — R109 Unspecified abdominal pain: Secondary | ICD-10-CM

## 2010-09-19 DIAGNOSIS — N920 Excessive and frequent menstruation with regular cycle: Secondary | ICD-10-CM

## 2010-09-19 DIAGNOSIS — E559 Vitamin D deficiency, unspecified: Secondary | ICD-10-CM

## 2010-09-19 DIAGNOSIS — R7301 Impaired fasting glucose: Secondary | ICD-10-CM

## 2010-09-19 LAB — COMPREHENSIVE METABOLIC PANEL
ALT: 64 U/L — ABNORMAL HIGH (ref 0–35)
Alkaline Phosphatase: 385 U/L — ABNORMAL HIGH (ref 39–117)
Creat: 0.38 mg/dL — ABNORMAL LOW (ref 0.40–1.20)
Sodium: 139 mEq/L (ref 135–145)
Total Bilirubin: 2.4 mg/dL — ABNORMAL HIGH (ref 0.3–1.2)
Total Protein: 8.3 g/dL (ref 6.0–8.3)

## 2010-09-19 MED ORDER — ONE-DAILY MULTI VITAMINS PO TABS: 1.0000 | ORAL_TABLET | Freq: Every day | ORAL | Status: DC

## 2010-09-20 ENCOUNTER — Encounter: Payer: Self-pay | Admitting: Family Medicine

## 2010-09-20 DIAGNOSIS — R109 Unspecified abdominal pain: Secondary | ICD-10-CM | POA: Insufficient documentation

## 2010-09-20 NOTE — Assessment & Plan Note (Signed)
Supplementation. See HPI.

## 2010-09-20 NOTE — Assessment & Plan Note (Signed)
IMPROVED

## 2010-09-20 NOTE — Assessment & Plan Note (Signed)
Will refer to GYN at this point. Normal [place].

## 2010-09-20 NOTE — Progress Notes (Signed)
  Subjective:    Patient ID: [name], female    DOB: 1975-08-28, 35 y.o.   MRN: [id number]  HPI  1. Abdominal Pain: IMPROVED AFTER TAKING KEFLEX FOR UTI (PRESCRIBED BASED ON S/S). HISTORY: x few weeks. Suprapubic. Mild-Mod. Described as cramping and sometimes sharp. Denies fever/chills, N/V/D/C. Endorses "stinging" when she urinates. Patient is not sexually active. No vaginal DC. Hx of abnormal LFTs, chronic cholecystitis - followed by Dr. Matthias Hughs. Has upcoming appointment. She does not feel that this is her gallbladder.   2. Low Vitamin D: Patient seen by Allergist. On prednisone burst. She is on them frequently. Vitamin D checked and < 10. Put on high-dose supplements and encouraged to take daily Calcium + Vitamin D.  3. Vaginal Bleeding: Patient endorsing vaginal bleeding > 1/2 each month. Rx OCP and Depo with no relief. Normal [place] (reviewed with patient). CBC stable. Will send to GYN.  Review of Systems SEE HPI.    Objective:   Physical Exam  Constitutional: She appears well-developed and well-nourished. No distress.  Cardiovascular: Normal rate, regular rhythm, normal heart sounds and intact distal pulses.   Pulmonary/Chest: Effort normal and breath sounds normal.  Abdominal: Soft. She exhibits no mass. There is no tenderness. There is no rebound and no guarding.  Neurological: She is alert.      Assessment & Plan:

## 2010-09-21 ENCOUNTER — Telehealth: Payer: Self-pay | Admitting: Family Medicine

## 2010-09-21 NOTE — Telephone Encounter (Signed)
Would like to know the results of her lab work.

## 2010-09-21 NOTE — Telephone Encounter (Signed)
Forward to Wallace 

## 2010-09-25 NOTE — Telephone Encounter (Signed)
No diabetes. Lever labs better than before.

## 2010-09-26 ENCOUNTER — Telehealth: Payer: Self-pay | Admitting: *Deleted

## 2010-09-26 NOTE — Telephone Encounter (Signed)
Called patient and gave results of labs.Busick, [name]

## 2010-09-27 ENCOUNTER — Telehealth: Payer: Self-pay | Admitting: Family Medicine

## 2010-09-27 NOTE — Telephone Encounter (Signed)
a 

## 2010-09-27 NOTE — Telephone Encounter (Signed)
Called patient and she states she was feeling constipated and took some of the med she has on hand for constipation. She states it is the powder and now she has had some loose stools  So she is aware of why the diarrhea now. Also she states she is having some vaginal spotting of dark blood , small amount ,also vaginal discharge and itching. Offered appointment tomorrow but she has to give 3 days notice for transportation. States she has appointment already scheduled on 10/06/2010 . Advised to call back if she would like to be seen sooner.

## 2010-09-27 NOTE — Telephone Encounter (Signed)
Pt calling to say she is having stomach cramps and have been having diarrhea.  Pt want a call back to talk with nurse about problem.

## 2010-10-06 ENCOUNTER — Ambulatory Visit (INDEPENDENT_AMBULATORY_CARE_PROVIDER_SITE_OTHER): Payer: Medicaid Other | Admitting: Family Medicine

## 2010-10-06 DIAGNOSIS — R945 Abnormal results of liver function studies: Secondary | ICD-10-CM

## 2010-10-06 DIAGNOSIS — K219 Gastro-esophageal reflux disease without esophagitis: Secondary | ICD-10-CM

## 2010-10-06 DIAGNOSIS — Q6650 Congenital pes planus, unspecified foot: Secondary | ICD-10-CM

## 2010-10-09 ENCOUNTER — Telehealth: Payer: Self-pay | Admitting: Family Medicine

## 2010-10-09 DIAGNOSIS — IMO0002 Reserved for concepts with insufficient information to code with codable children: Secondary | ICD-10-CM

## 2010-10-09 DIAGNOSIS — M1710 Unilateral primary osteoarthritis, unspecified knee: Secondary | ICD-10-CM

## 2010-10-09 MED ORDER — NAPROXEN SODIUM 220 MG PO TABS: 220.0000 mg | ORAL_TABLET | Freq: Two times a day (BID) | ORAL | Status: DC

## 2010-10-09 NOTE — Telephone Encounter (Signed)
Added Naproxen to med list and DC'd Motrin. NOTE: change was because of patient preference, not my order.

## 2010-10-09 NOTE — Telephone Encounter (Signed)
Faxed Rx to attn: Desiree Hane [phone]. Arlyss Repress

## 2010-10-09 NOTE — Telephone Encounter (Signed)
Will fwd. To PCP to write the order. Arlyss Repress

## 2010-10-09 NOTE — Telephone Encounter (Signed)
Pt says MD told pt to take aleve for pain instead of ibuprofen, they need an order faxed to them stating this so pt can take there if needed.

## 2010-10-10 ENCOUNTER — Encounter: Payer: Self-pay | Admitting: Family Medicine

## 2010-10-10 NOTE — Progress Notes (Signed)
  Subjective:    Patient ID: [name], female    DOB: 08-22-1975, 35 y.o.   MRN: [id number]  HPI  1. Abdominal Pain: Improved. Still with some epigastric pain. Has upcoming EGD and colonoscopy per Dr. Matthias Hughs. Rx Omeprazole. No fever/chills, N/V/D.   Review of Systems SEE HPI.    Objective:   Physical Exam  Vitals reviewed. Constitutional: She appears well-developed and well-nourished.  Cardiovascular: Normal rate, regular rhythm and normal heart sounds.   Pulmonary/Chest: Effort normal and breath sounds normal.  Abdominal: Soft. Bowel sounds are normal. There is tenderness.       Mild RUQ, epigastric.      Assessment & Plan:

## 2010-10-10 NOTE — Assessment & Plan Note (Signed)
Followed by GI. Upcoming EGD, colonoscopy.

## 2010-10-10 NOTE — Assessment & Plan Note (Signed)
Will make no change to medication today. Patient has upcoming EGD, colonoscopy. Will wait for results.

## 2010-10-19 ENCOUNTER — Encounter (INDEPENDENT_AMBULATORY_CARE_PROVIDER_SITE_OTHER): Payer: Medicaid Other | Admitting: Family Medicine

## 2010-10-19 ENCOUNTER — Other Ambulatory Visit: Payer: Self-pay | Admitting: Family Medicine

## 2010-10-19 DIAGNOSIS — N920 Excessive and frequent menstruation with regular cycle: Secondary | ICD-10-CM

## 2010-10-20 ENCOUNTER — Encounter: Payer: Self-pay | Admitting: Family Medicine

## 2010-10-20 ENCOUNTER — Ambulatory Visit (INDEPENDENT_AMBULATORY_CARE_PROVIDER_SITE_OTHER): Payer: Medicaid Other | Admitting: Family Medicine

## 2010-10-20 VITALS — BP 116/80 | HR 76 | Wt 150.9 lb

## 2010-10-20 DIAGNOSIS — R300 Dysuria: Secondary | ICD-10-CM

## 2010-10-20 DIAGNOSIS — R109 Unspecified abdominal pain: Secondary | ICD-10-CM

## 2010-10-20 LAB — POCT URINALYSIS DIPSTICK
Ketones, UA: NEGATIVE
Protein, UA: NEGATIVE
Spec Grav, UA: 1.02
pH, UA: 7

## 2010-10-20 MED ORDER — BECLOMETHASONE DIPROPIONATE 80 MCG/ACT IN AERS: 1.0000 | INHALATION_SPRAY | RESPIRATORY_TRACT | Status: DC | PRN

## 2010-10-20 MED ORDER — NORETHINDRONE-ETH ESTRADIOL 0.4-35 MG-MCG PO TABS: 1.0000 | ORAL_TABLET | Freq: Every day | ORAL | Status: DC

## 2010-10-20 NOTE — Group Therapy Note (Signed)
NAMELariah, [name]                  ACCOUNT NO.:  [id number]  MEDICAL RECORD NO.:  [id number]           PATIENT TYPE:  A  LOCATION:  [facility]                   FACILITY:  [facility]  PHYSICIAN:  Tinnie Gens, MD        DATE OF BIRTH:  [birth year]  DATE OF SERVICE:                                 CLINIC NOTE  This is a 35 year old nulliparous patient who is referred by [facility] today for evaluation of menorrhagia.  The patient has previously been on Depo-Provera and began having heavy cycles in December.  Workup has included a pelvic sonogram which was normal and endometrial thickness of 4 mm.  She continues to have bleeding, she reports, 20 days a month and then going back to.  She was put on oral contraceptives after she came in complaining of the Sprintec at 35 mcg pill with only 25 mcg of norgestimate and she has continued to have bleeding on that.  PAST MEDICAL HISTORY:  Significant for: 1. Seizure disorder. 2. Severe asthma. 3. Iron deficiency anemia.  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  MEDICATIONS:  She is on: 1. Albuterol 90 mcg 2 puffs q.4 h. p.r.n. 2. Astepro 0.15% solution via nasal route daily. 3. Symbicort 160/4.5 mcg INH 2 puffs daily. 4. Tegretol 400 mg b.i.d. 5. Zyrtec 10 mg daily. 6. Depakote 250 mg p.o. q.i.d. 7. Vitamin D2, 5000 units weekly. 8. Iron 325 mg daily. 9. Flonase 50 mcg 1 spray nasal route daily. 10.Ibuprofen 600 mg q.6 h. p.r.n. 11.Singulair 10 mg p.o. daily. 12.Multivitamin 1 p.o. daily. 13.Sprintec 0.25/35 mg/mcg 1 p.o. daily. 14.Prilosec 40 mg p.o. daily. 15.GlycoLax 17 g p.o. as needed for constipation. 16.Selsun shampoo 2.5% topical as needed. 17.QVAR 80 mcg 1 puff daily.  ALLERGIES:  PSEUDOEPHEDRINE, SULFONAMIDES, TRAMADOL, and PENICILLIN.  OBSTETRICAL HISTORY:  She is G0.  GYNECOLOGICAL HISTORY:  Menarche at age 5, cycles are somewhat irregular and are lasting between 6  and 17 days.  FAMILY HISTORY:  Diabetes, heart disease, hypertension, colon cancer in an aunt and an uncle.  SOCIAL HISTORY:  The patient lives alone and makes her own medical decisions, but she is not employed.  She goes to a day care center during the day.  Fourteen-point review of systems reviewed.  She reports some weight gain approximately 10 pounds, headache, lightheadedness, shortness of breath, and abdominal pain.  PHYSICAL EXAMINATION:  EXTERNAL GENITALIA:  Normal external female genitalia.  BUS normal.  Vagina is pale with somewhat loss of rugation. A pediatric speculum was used.  The cervix was visualized, appeared very infantile and a Pap smear was obtained.  There did not appear to be any sort of endometrial or cervical polyp that might have caused any sort of bleeding.  IMPRESSION:  Menorrhagia of unclear etiology.  PLAN:  We will switch her to a 35 mcg 1 mg pill Ovcon 135 one p.o. daily.  If this fails to control her, we would consider checking a TSH. If that also comes back negative, we would consider D and C in the OR. I do not  think the patient will tolerate endometrial sampling in the office.          ______________________________ Tinnie Gens, MD    TP/MEDQ  D:  10/19/2010  T:  10/20/2010  Job:  [id number]

## 2010-10-20 NOTE — Patient Instructions (Signed)
It was nice to see you today!  Make an appointment at the [facility] for adjustment of your orthortics.  Okay to double book per Dr. [name] Kettle if needed - needs appointment next week.

## 2010-10-24 ENCOUNTER — Other Ambulatory Visit: Payer: Self-pay | Admitting: Gastroenterology

## 2010-10-24 DIAGNOSIS — R1011 Right upper quadrant pain: Secondary | ICD-10-CM

## 2010-10-25 ENCOUNTER — Telehealth: Payer: Self-pay | Admitting: Family Medicine

## 2010-10-25 NOTE — Telephone Encounter (Signed)
Pt asking for test results from yesterday

## 2010-10-25 NOTE — Telephone Encounter (Signed)
No infection. Patient needs to make sure that she follows-up with her GI doc.

## 2010-10-26 ENCOUNTER — Telehealth: Payer: Self-pay | Admitting: Family Medicine

## 2010-10-26 NOTE — Telephone Encounter (Signed)
Called pt. Informed. Pt reports that she went to the GI doctor and he recommended CT scan and to see a surgeon. I asked the pt to have records faxed to Dr.Wallace. She agreed. [name], [name]

## 2010-10-26 NOTE — Assessment & Plan Note (Signed)
UA negative for infection. Suspect gallbladder pathology. Will await results of recent tests with patient. No red flags today.

## 2010-10-26 NOTE — Telephone Encounter (Signed)
Has a question about continuing Vitamin D

## 2010-10-26 NOTE — Progress Notes (Signed)
  Subjective:    Patient ID: [name], female    DOB: 02/14/1976, 35 y.o.   MRN: [id number]  HPI  1. Abdominal Pain: Improved. Still with some epigastric pain. Today, with some suprapubic pain as well. Had recent EGD and colonoscopy per Dr. Matthias Hughs - has upcoming appointment with him to discuss results. Rx Omeprazole. No fever/chills, N/V/D, back pain. Possible dysuria.   Review of Systems SEE HPI.    Objective:   Physical Exam  Vitals reviewed. Constitutional: She appears well-developed and well-nourished.  Cardiovascular: Normal rate, regular rhythm and normal heart sounds.   Pulmonary/Chest: Effort normal and breath sounds normal.  Abdominal: Soft. Bowel sounds are normal. There is tenderness.       Mild RUQ, epigastric.      Assessment & Plan:

## 2010-10-27 ENCOUNTER — Telehealth: Payer: Self-pay | Admitting: *Deleted

## 2010-10-27 NOTE — Telephone Encounter (Signed)
Called pt. No answer. Pt has upcoming appt with PCP. Lori KitchenArlyss Woodward

## 2010-10-30 ENCOUNTER — Ambulatory Visit
Admission: RE | Admit: 2010-10-30 | Discharge: 2010-10-30 | Disposition: A | Discharge status: Home / Self Care | Payer: Medicaid Other | Source: Ambulatory Visit | Attending: Gastroenterology | Admitting: Gastroenterology

## 2010-10-30 DIAGNOSIS — R1011 Right upper quadrant pain: Secondary | ICD-10-CM

## 2010-10-30 MED ORDER — IOHEXOL 300 MG/ML  SOLN
100.0000 mL | Freq: Once | INTRAMUSCULAR | Status: AC | PRN
  Administered 2010-10-30: 100 mL via INTRAVENOUS

## 2010-10-31 ENCOUNTER — Encounter: Payer: Self-pay | Admitting: Family Medicine

## 2010-10-31 ENCOUNTER — Ambulatory Visit (INDEPENDENT_AMBULATORY_CARE_PROVIDER_SITE_OTHER): Payer: Medicaid Other | Admitting: Family Medicine

## 2010-10-31 VITALS — BP 110/72 | HR 88

## 2010-10-31 DIAGNOSIS — Q6650 Congenital pes planus, unspecified foot: Secondary | ICD-10-CM

## 2010-10-31 NOTE — Progress Notes (Signed)
  Subjective:    Patient ID: [name], female    DOB: January 18, 1976, 35 y.o.   MRN: [id number]  HPI  [name] has been having some increasing pain in her forefoot. She continues to wear her orthotics. She has had no new injury. She walks several miles each day. He had no numbness or tingling in her feet.  Review of Systems    no unexpected weight loss or gain Objective:   Physical Exam    generals well-developed female no acute distress Feet: Bilateral pes  neuorvascularly intact  B loss of transverse arch  Gait: out toeing. Overpronation B  Skin on feet--no lesions or rash  Additional Exam: Patient was fitted for a : standard, cushioned, semi-rigid orthotic.  The orthotic was heated and afterward the patient stood on the orthotic blank positioned on the orthotic stand.  The patient was positioned in subtalar neutral position and 10 degrees of ankle dorsiflexion in a weight bearing stance.  After completion of molding, a stable base was applied to the orthotic blank.  The blank was ground to a stable position for weight bearing.  Size:7  Blank:blu swirl BASE: blue foam    Assessment & Plan:

## 2010-10-31 NOTE — Assessment & Plan Note (Signed)
New orthotics constructed. She walks so much that a pair usually lasts about 6 m before they are completely broken down. F/u 6 m

## 2010-11-01 ENCOUNTER — Encounter: Payer: Self-pay | Admitting: Family Medicine

## 2010-11-01 ENCOUNTER — Other Ambulatory Visit: Payer: Self-pay | Admitting: Family Medicine

## 2010-11-02 ENCOUNTER — Encounter: Payer: Self-pay | Admitting: Family Medicine

## 2010-11-02 ENCOUNTER — Telehealth: Payer: Self-pay | Admitting: Family Medicine

## 2010-11-02 ENCOUNTER — Ambulatory Visit (INDEPENDENT_AMBULATORY_CARE_PROVIDER_SITE_OTHER): Payer: Medicaid Other | Admitting: Family Medicine

## 2010-11-02 DIAGNOSIS — E559 Vitamin D deficiency, unspecified: Secondary | ICD-10-CM

## 2010-11-02 DIAGNOSIS — R109 Unspecified abdominal pain: Secondary | ICD-10-CM

## 2010-11-02 NOTE — Assessment & Plan Note (Signed)
See overview. Already received letter from Dr. Matthias Hughs and referral is in process.

## 2010-11-02 NOTE — Telephone Encounter (Signed)
Pt asking to speak with MD, received CT results, her gall bladder is smaller & apendix has a stone in it. Pt needs to have surgery and have surgery on both at the same time.

## 2010-11-02 NOTE — Assessment & Plan Note (Signed)
Recommended daily calcium plus vitamin D. Suggested that she try the chocolates - VIACTIVE.

## 2010-11-02 NOTE — Progress Notes (Signed)
  Subjective:    Patient ID: [name], female    DOB: 08-May-1976, 35 y.o.   MRN: [id number]  HPI  1. Vitamin D: Patient finishing high-dose supplement and wondering if she needs a refill.  2. GI: Wants to talk about General Surgery referral for gallbladder.  Review of Systems No fever/chills, N/V/C, dysuria. Some RUQ intermittent pain with slight intermittent diarrhea - no blood.    Objective:   Physical Exam  Vitals reviewed. Constitutional: She appears well-developed and well-nourished. No distress.  Abdominal: Soft. Bowel sounds are normal. She exhibits no distension and no mass. There is tenderness. There is no rebound and no guarding.       RUQ.      Assessment & Plan:

## 2010-11-03 ENCOUNTER — Telehealth: Payer: Self-pay | Admitting: *Deleted

## 2010-11-03 NOTE — Telephone Encounter (Signed)
Called and left message for new patient coordinator. We need appt for pt (surgery for cholecystectomy) Waiting for call back .Arlyss Repress

## 2010-11-03 NOTE — Telephone Encounter (Signed)
ccs called back. appt 11-16-10 at 11am with Dr.Blackman. [address] # [phone]. Faxed info to [phone]  .Arlyss Repress

## 2010-11-03 NOTE — Telephone Encounter (Signed)
Called pt and informed of appt. Pt repeated it back to me. [facility] will also mail a letter with the appt info to pt. [name], [name]

## 2010-11-15 ENCOUNTER — Encounter: Payer: Self-pay | Admitting: Family Medicine

## 2010-11-16 ENCOUNTER — Ambulatory Visit (INDEPENDENT_AMBULATORY_CARE_PROVIDER_SITE_OTHER): Payer: Medicaid Other | Admitting: Obstetrics & Gynecology

## 2010-11-16 DIAGNOSIS — N938 Other specified abnormal uterine and vaginal bleeding: Secondary | ICD-10-CM

## 2010-11-16 DIAGNOSIS — N949 Unspecified condition associated with female genital organs and menstrual cycle: Secondary | ICD-10-CM

## 2010-11-17 ENCOUNTER — Ambulatory Visit: Payer: Medicaid Other | Admitting: Family Medicine

## 2010-11-17 ENCOUNTER — Encounter (INDEPENDENT_AMBULATORY_CARE_PROVIDER_SITE_OTHER): Payer: Self-pay | Admitting: Surgery

## 2010-11-17 NOTE — Group Therapy Note (Unsigned)
NAMEDariela, [name]                  ACCOUNT NO.:  [id number]  MEDICAL RECORD NO.:  [id number]           PATIENT TYPE:  A  LOCATION:  [facility]                   FACILITY:  [facility]  PHYSICIAN:  Allie Bossier, MD        DATE OF BIRTH:  21-Oct-1975  DATE OF SERVICE:  11/16/2010                                 CLINIC NOTE  [name] is a 35 year old mentally challenged single African American gravida 0 who was seen by Dr. Shawnie Pons on Oct 19, 2010, for dysfunctional uterine bleeding.  The patient had been on Depo-Provera and then began having heavy periods in December.  Workup included normal pelvic sonogram with an endometrial thickness of 4 mm.  Dr. Shawnie Pons switched her to hokum 135 and had a followup.  At this time, she tells me she bled for 8 days without bleeding for approximately 1 week and then started bleeding for another week.  Today, I will plan on checking a TSH.  If that is normal when she follows up, I would recommend that we schedule her for some endometrial sampling in the operating room (D and C).  I would also suggest at that time that we put in a Mirena IUD if she is willing.  Of note, her uncle is a legal guardian.  I have told her that we will need her uncle here from next visit, so we can make a plan for the OR case.     Allie Bossier, MD    MCD/MEDQ  D:  11/16/2010  T:  11/17/2010  Job:  [id number]

## 2010-11-21 ENCOUNTER — Encounter: Payer: Self-pay | Admitting: Family Medicine

## 2010-11-21 ENCOUNTER — Telehealth (INDEPENDENT_AMBULATORY_CARE_PROVIDER_SITE_OTHER): Payer: Self-pay | Admitting: Surgery

## 2010-11-21 NOTE — Telephone Encounter (Signed)
I told pt that she can have her D & C surgery...ok Per Dr Magnus Ivan.Marland KitchenMarland KitchenPattricia Boss 11-21-10

## 2010-11-27 ENCOUNTER — Telehealth: Payer: Self-pay | Admitting: Family Medicine

## 2010-11-27 NOTE — Telephone Encounter (Signed)
Patient recently had a colonoscopy and told Dr. Matthias Hughs that she was having irritation in her bottom.  After her procedure he told her she had hemorrhoids but did not prescribe anything.  Asked her if she had tried any OTCs such as Preparation H or Anusol.  She said no.  Advised her to give these a try first as well as Tucks pads for the external ones.  Sh will get her uncle to take her to the pharmacy tomorrow.  Advised her if these did not help to call me back and we would have her doctor take a look at them.  Patient agreeable.

## 2010-11-27 NOTE — Telephone Encounter (Signed)
Pt has hemorrhoids and is getting worse - wants to know if there is something that can be called in. [facility]- [address]

## 2010-11-30 ENCOUNTER — Ambulatory Visit: Payer: Medicaid Other | Admitting: Family Medicine

## 2010-12-05 ENCOUNTER — Telehealth: Payer: Self-pay | Admitting: *Deleted

## 2010-12-05 NOTE — Telephone Encounter (Signed)
Received call from Dr. Willa Rough, allergist office. Patient's number of visits has expired for this year . Her new year starts over in September. They want to know if MD will allow more visits for this year. if MD needs to talk with Dr. Willa Rough about this please call at [phone].  She will be in the [facility] office tomorrow.

## 2010-12-08 NOTE — Telephone Encounter (Signed)
OK for 1 more year as needed.

## 2010-12-08 NOTE — Telephone Encounter (Signed)
Will forward to MD.  

## 2010-12-13 ENCOUNTER — Ambulatory Visit (INDEPENDENT_AMBULATORY_CARE_PROVIDER_SITE_OTHER): Payer: Medicaid Other | Admitting: Family Medicine

## 2010-12-13 ENCOUNTER — Encounter: Payer: Self-pay | Admitting: Family Medicine

## 2010-12-13 VITALS — BP 112/76 | HR 80 | Wt 147.3 lb

## 2010-12-13 DIAGNOSIS — N898 Other specified noninflammatory disorders of vagina: Secondary | ICD-10-CM

## 2010-12-13 DIAGNOSIS — B373 Candidiasis of vulva and vagina: Secondary | ICD-10-CM | POA: Insufficient documentation

## 2010-12-13 DIAGNOSIS — J45909 Unspecified asthma, uncomplicated: Secondary | ICD-10-CM

## 2010-12-13 LAB — POCT WET PREP (WET MOUNT)
Trichomonas Wet Prep HPF POC: NEGATIVE
WBC, Wet Prep HPF POC: >20

## 2010-12-13 MED ORDER — FLUCONAZOLE 150 MG PO TABS: 150.0000 mg | ORAL_TABLET | Freq: Once | ORAL | Status: AC

## 2010-12-13 NOTE — Assessment & Plan Note (Signed)
Appears to be difficult to control based on review of chart.  No wheezing today. On ICS, singulair and albuterol.  Plan to continue and follow for flairs. Co-management with pulm.

## 2010-12-13 NOTE — Progress Notes (Signed)
1) Asthma: Continues to be bothersome. Notes wheezing and using albuterol about twice a day. Is already on two controller medications (QVAR and Symbicort). Has a pulmonologist who is also managing this issue. Not currently wheezing. Notes an occasional cough.   2) Rash/Vaginal discharge. Notes this in the past few months. Notes vulvar pain and itching especially around her cycle. However she notes some mild white vaginal discharge recently. 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 She denies any fevers or chills.   PMH reviewed.  ROS as above otherwise neg  Exam:  Vs noted.  Gen: Well NAD HEENT: EOMI, PERRL, MMM Lungs: CTABL Nl WOB Heart: RRR no MRG Abd: NABS, NT, ND Exts: Non edematous BL  LE GYN: Normal external genitalia. Some white discharge visible on introitus. Small amount collected at introitus for wet prep with cotton q-tip. No pain on sampling.

## 2010-12-13 NOTE — Assessment & Plan Note (Signed)
Based on symptoms and wet prep result showing yeast.  Plan to treat with oral fluconazole x1 and follow up prn.

## 2010-12-13 NOTE — Patient Instructions (Signed)
Thank you for coming in today. You have a yeast infection. Take the fluconazole once and you should feel better in a few days.  Come back in 2 months.  Let me know if your asthma gets worse.  Take care.

## 2010-12-15 ENCOUNTER — Telehealth: Payer: Self-pay | Admitting: Family Medicine

## 2010-12-15 NOTE — Telephone Encounter (Signed)
Has a question about whether she is supposed to continue the Vitamin D supplement.  She was in the other day and discussed several medications with Dr. Denyse Amass, but when she got home she realized they didn't talk about this one.

## 2010-12-15 NOTE — Telephone Encounter (Signed)
Patient is currently taking a daily calcium/vitamin D chewable. Told her it was okay to continue with this.  She requested that Dr. Denyse Amass add this to her med profile and that a copy of her med sheet be faxed to her adult day care center ([facility]) attn Ines Bloomer RN.  Their fax number is [phone].

## 2010-12-19 NOTE — Telephone Encounter (Signed)
Will fwd. To pcp

## 2010-12-20 NOTE — Telephone Encounter (Signed)
completed

## 2010-12-22 ENCOUNTER — Ambulatory Visit (HOSPITAL_COMMUNITY)
Admission: RE | Admit: 2010-12-22 | Discharge: 2010-12-22 | Disposition: A | Discharge status: Home / Self Care | Payer: Medicaid Other | Source: Ambulatory Visit | Attending: Surgery | Admitting: Surgery

## 2010-12-22 ENCOUNTER — Encounter (HOSPITAL_COMMUNITY)
Admission: RE | Admit: 2010-12-22 | Discharge: 2010-12-22 | Disposition: A | Discharge status: Home / Self Care | Payer: Medicaid Other | Source: Ambulatory Visit | Attending: Surgery | Admitting: Surgery

## 2010-12-22 ENCOUNTER — Other Ambulatory Visit (INDEPENDENT_AMBULATORY_CARE_PROVIDER_SITE_OTHER): Payer: Self-pay | Admitting: Surgery

## 2010-12-22 DIAGNOSIS — N920 Excessive and frequent menstruation with regular cycle: Secondary | ICD-10-CM | POA: Insufficient documentation

## 2010-12-22 DIAGNOSIS — R52 Pain, unspecified: Secondary | ICD-10-CM

## 2010-12-22 DIAGNOSIS — Z01812 Encounter for preprocedural laboratory examination: Secondary | ICD-10-CM | POA: Insufficient documentation

## 2010-12-22 DIAGNOSIS — Z01818 Encounter for other preprocedural examination: Secondary | ICD-10-CM | POA: Insufficient documentation

## 2010-12-22 HISTORY — DX: Excessive and frequent menstruation with regular cycle: N92.0

## 2010-12-22 LAB — CBC
HCT: 31 % — ABNORMAL LOW (ref 36.0–46.0)
Hemoglobin: 10.8 g/dL — ABNORMAL LOW (ref 12.0–15.0)
MCH: 28.7 pg (ref 26.0–34.0)
MCHC: 34.8 g/dL (ref 30.0–36.0)
MCV: 82.4 fL (ref 78.0–100.0)

## 2010-12-29 ENCOUNTER — Other Ambulatory Visit (INDEPENDENT_AMBULATORY_CARE_PROVIDER_SITE_OTHER): Payer: Self-pay | Admitting: Surgery

## 2010-12-29 ENCOUNTER — Ambulatory Visit (HOSPITAL_COMMUNITY)
Admission: RE | Admit: 2010-12-29 | Discharge: 2011-01-02 | Disposition: A | Discharge status: Home / Self Care | Payer: Medicaid Other | Source: Ambulatory Visit | Attending: Surgery | Admitting: Surgery

## 2010-12-29 DIAGNOSIS — R6250 Unspecified lack of expected normal physiological development in childhood: Secondary | ICD-10-CM | POA: Insufficient documentation

## 2010-12-29 DIAGNOSIS — J45909 Unspecified asthma, uncomplicated: Secondary | ICD-10-CM | POA: Insufficient documentation

## 2010-12-29 DIAGNOSIS — K828 Other specified diseases of gallbladder: Secondary | ICD-10-CM | POA: Insufficient documentation

## 2010-12-29 DIAGNOSIS — K811 Chronic cholecystitis: Secondary | ICD-10-CM

## 2010-12-29 DIAGNOSIS — D573 Sickle-cell trait: Secondary | ICD-10-CM | POA: Insufficient documentation

## 2010-12-29 DIAGNOSIS — Z01810 Encounter for preprocedural cardiovascular examination: Secondary | ICD-10-CM | POA: Insufficient documentation

## 2010-12-29 DIAGNOSIS — K36 Other appendicitis: Secondary | ICD-10-CM | POA: Insufficient documentation

## 2010-12-29 DIAGNOSIS — K219 Gastro-esophageal reflux disease without esophagitis: Secondary | ICD-10-CM | POA: Insufficient documentation

## 2010-12-29 DIAGNOSIS — G4733 Obstructive sleep apnea (adult) (pediatric): Secondary | ICD-10-CM | POA: Insufficient documentation

## 2010-12-29 DIAGNOSIS — G40909 Epilepsy, unspecified, not intractable, without status epilepticus: Secondary | ICD-10-CM | POA: Insufficient documentation

## 2010-12-29 HISTORY — PX: LAPAROSCOPIC CHOLECYSTECTOMY: SUR755

## 2010-12-30 LAB — CBC
Hemoglobin: 9.7 g/dL — ABNORMAL LOW (ref 12.0–15.0)
MCH: 29.1 pg (ref 26.0–34.0)
MCV: 83.2 fL (ref 78.0–100.0)
RBC: 3.33 MIL/uL — ABNORMAL LOW (ref 3.87–5.11)

## 2010-12-30 LAB — COMPREHENSIVE METABOLIC PANEL
ALT: 98 U/L — ABNORMAL HIGH (ref 0–35)
CO2: 26 mEq/L (ref 19–32)
Calcium: 7.9 mg/dL — ABNORMAL LOW (ref 8.4–10.5)
Creatinine, Ser: <0.47 mg/dL — ABNORMAL LOW (ref 0.50–1.10)
Glucose, Bld: 98 mg/dL (ref 70–99)
Total Bilirubin: 3.8 mg/dL — ABNORMAL HIGH (ref 0.3–1.2)

## 2010-12-31 ENCOUNTER — Ambulatory Visit (HOSPITAL_COMMUNITY): Payer: Medicaid Other

## 2010-12-31 LAB — COMPREHENSIVE METABOLIC PANEL
AST: 86 U/L — ABNORMAL HIGH (ref 0–37)
Albumin: 2.7 g/dL — ABNORMAL LOW (ref 3.5–5.2)
Chloride: 101 mEq/L (ref 96–112)
Creatinine, Ser: <0.47 mg/dL — ABNORMAL LOW (ref 0.50–1.10)
Total Bilirubin: 4.1 mg/dL — ABNORMAL HIGH (ref 0.3–1.2)
Total Protein: 8.1 g/dL (ref 6.0–8.3)

## 2010-12-31 MED ORDER — TECHNETIUM TC 99M MEBROFENIN IV KIT: 5.0000 | PACK | Freq: Once | INTRAVENOUS | Status: AC | PRN

## 2010-12-31 MED ORDER — GADOBENATE DIMEGLUMINE 529 MG/ML IV SOLN
15.0000 mL | Freq: Once | INTRAVENOUS | Status: AC | PRN
  Administered 2010-12-31: 15 mL via INTRAVENOUS

## 2011-01-02 LAB — HEPATIC FUNCTION PANEL
ALT: 57 U/L — ABNORMAL HIGH (ref 0–35)
AST: 70 U/L — ABNORMAL HIGH (ref 0–37)
Albumin: 2.5 g/dL — ABNORMAL LOW (ref 3.5–5.2)
Alkaline Phosphatase: 340 U/L — ABNORMAL HIGH (ref 39–117)
Indirect Bilirubin: 3 mg/dL — ABNORMAL HIGH (ref 0.3–0.9)
Total Protein: 7.7 g/dL (ref 6.0–8.3)

## 2011-01-03 ENCOUNTER — Telehealth: Payer: Self-pay | Admitting: *Deleted

## 2011-01-03 NOTE — Op Note (Signed)
NAMEDanetta, [name]                  ACCOUNT NO.:  [id number]  MEDICAL RECORD NO.:  [id number]  LOCATION:  [facility]                         FACILITY:  [facility]  PHYSICIAN:  Abigail Miyamoto, M.D. DATE OF BIRTH:  01/31/1976  DATE OF PROCEDURE:  12/29/2010 DATE OF DISCHARGE:                              OPERATIVE REPORT   PREOPERATIVE DIAGNOSES: 1. Biliary dyskinesia. 2. Chronic appendicitis.  POSTOPERATIVE DIAGNOSES: 1. Biliary dyskinesia. 2. Chronic appendicitis.  PROCEDURE: 1. Laparoscopic cholecystectomy with intraoperative cholangiogram. 2. Laparoscopic appendectomy.  SURGEON:  Abigail Miyamoto, MD  ANESTHESIA:  General and 0.5% Marcaine.  ESTIMATED BLOOD LOSS:  Minimal.  INDICATIONS:  [name] is a 34-year female presented with right-sided abdominal pain.  She has had a thorough workup with CAT scans, ultrasounds and HIDA scan.  She has a 3% gallbladder ejection fraction as well as a thickened appendix with appendicolith.  She carries a history of having had appendicitis in the past.  Decision was made to proceed with cholecystectomy as well as appendectomy.  FINDINGS:  The patient had a chronically contracted scarred gallbladder which was quite small.  She also had a thickened appearing appendix especially in the distal third of the appendix.  Both specimens were sent to pathology for evaluation.  PROCEDURE IN DETAIL:  The patient was brought to the operative room, identified as [name], she was placed supine on the operating table and general anesthesia was induced.  Her abdomen was then prepped and draped in usual sterile fashion, using a #15 blade, a small vertical incision was made below the umbilicus.  This was carried down to the fascia which was then opened with scalpel.  A hemostat was used to pass through the peritoneal cavity under direct vision.  Next, a 0 Vicryl suture was placed around the fascial opening.  The Hasson port was placed through the  opening and insufflation of the abdomen was begun.  A 5-mm port was then placed in the patient epigastrium and two more in the right upper quadrant, both under direct vision.  Upon entering the abdomen, the liver itself was nodular in appearance consistent with potential early cirrhosis, the gallbladder itself was contracted and thick-walled. The gallbladder was then retracted above the liver bed. Dissection was then carried out the base of the gallbladder.  The gallbladder toward the base making the dissection moderately difficult I was, however, able to identify the cystic duct and cystic artery.  A critical window achieved around both.  I clipped both proximally and distally and transected them.  I was able to excise the gallbladder and the remaining gallbladder.  Stopped just above the cystic duct with cautery.  Once the gallbladder was completely removed, and this placed in Endosac and sent to pathology for evaluation.  I then achieved hemostasis in the liver bed with electrocautery as well as a piece of FiberWire for hemostatic purposes.  At this point, I then turned my attention toward the appendix.  The cecum and the appendix was easily identified.  The base of the appendix was normal appearance, but the distal appendix appeared quite thickened with some chronic inflammatory changes.  I was able to take down mesoappendix with a  Harmonic scalpel and transected the base of the appendix with a Endoscopic GIA stapler. The appendix was then placed in an endosac and removed through the incision at the umbilicus.  I then thoroughly irrigated the right lower and right upper quadrants with normal saline.  Hemostasis appeared to be achieved at the mesoappendix and appendiceal stump, they also be appeared to be achieved in the right upper quadrant.  All ports were removed under direct vision.  The abdomen was deflated.  A 0 Vicryl at the umbilicus was tied in place closing the fascial defect.   All incisions were anesthetized with Marcaine and closed with 4-0 Monocryl and subcuticular sutures.  Steri-Strips and Band-Aids were applied.  The patient tolerated procedure well.  All counts were correct at the end of procedure.  The patient was then extubated in the operating room and taken in stable condition to recovery room.     Abigail Miyamoto, M.D.     DB/MEDQ  D:  12/29/2010  T:  12/29/2010  Job:  [id number]  Electronically Signed by Abigail Miyamoto M.D. on 01/03/2011 10:09:03 AM

## 2011-01-03 NOTE — Telephone Encounter (Signed)
Dr. Willa Rough office calling again regarding  approval for visit.  The new year started over in July but they have a visit for June that was denied by [facility] because her visits had run out for the year. They need to do an override. They need Dr. Denyse Amass to compose a letter that they can send to medicaid to advise why more visits were needed than original plan allowed.  Please fax back to Dr. Esperanza Richters office , attention  : Olegario Messier [phone]

## 2011-01-04 ENCOUNTER — Ambulatory Visit: Payer: Medicaid Other | Admitting: Family Medicine

## 2011-01-04 ENCOUNTER — Encounter: Payer: Self-pay | Admitting: Family Medicine

## 2011-01-04 NOTE — Telephone Encounter (Signed)
Letter written and sent to admin team to fax.

## 2011-01-05 NOTE — Consult Note (Signed)
Lori, Woodward                  ACCOUNT NO.:  [id number]  MEDICAL RECORD NO.:  [id number]  LOCATION:  [id number]                         FACILITY:  [facility]  PHYSICIAN:  Willis Modena, MD     DATE OF BIRTH:  12-12-1975  DATE OF CONSULTATION:  12/31/2010 DATE OF DISCHARGE:                                CONSULTATION   REASON FOR CONSULTATION:  Jaundice, abdominal pain, post cholecystectomy.  CHIEF COMPLAINT:  Abdominal pain, jaundice.  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  She had a cholecystectomy yesterday for abdominal pain in the setting of biliary dyskinesia.  Postoperatively, she developed some increased pain as well as jaundice.  A HIDA scan done today shows added uptake into the liver, but essentially no radiotracer emptying into the small bowel with no visualization of the extrahepatic biliary tree.  She has some postoperative discomfort as well as some generalized abdominal discomfort, but is not appreciably uncomfortable.  She has had no nausea or vomiting.  She is tolerating her liquid tray.  PAST MEDICAL HISTORY:  Seizure disorder, allergies, and chronic constipation.  PAST SURGICAL HISTORY:  She had a cholecystectomy done yesterday.  She has had tympanostomy tubes and tonsillectomy done in the past.  HOME MEDICATIONS:  Albuterol, Astepro, QVAR, budesonide, Tegretol, Zyrtec, Depakote, vitamin D, iron, Flonase, Depo-Provera, Singulair, multivitamin, Aleve, Ovcon, Prilosec, and Dulcolax.  ALLERGIES:  PENICILLIN.  SOCIAL HISTORY:  She spends most of her day in the adult day care, but lives on her own and is her own Clinical research associate.  No smoking.  No alcohol.  FAMILY HISTORY:  No family history of gallbladder problems or liver disease.  REVIEW OF SYSTEMS:  As per history of present illness.  All other systems explored in detail were  negative.  PHYSICAL EXAMINATION:  VITAL SIGNS:  Blood pressure 112/67, heart rate 93, respiratory rate 18, temperature 98.6, and oxygen saturation on 100% on room air. GENERAL:  [name] is nontoxic appearing. HEENT:  Fair dentition and looks like she has some gingival hyperplasia. No oropharyngeal lesions.  Eyes, sclerae are icteric bilaterally. Conjunctivae are pink. NECK:  Supple. LUNGS:  Clear. HEART:  Regular. ABDOMEN:  She has some postoperative tenderness, mildly distended.  No peritonitis.  Bowel sounds are hypoactive, but present.  No obvious liver or splenic enlargement. EXTREMITIES:  No peripheral cyanosis, clubbing, or edema. NEUROLOGIC:  She has a bit of expressive slowing difficult to understand her speech at times, but am able to obtain history from her with time. She is diffusely weak, but nonfocal without lateralizing signs. SKIN:  No obvious rash or ecchymosis. LYMPHATICS:  No palpable axillary, submandibular, or supraclavicular adenopathy. PSYCHIATRIC:  Normal mood and affect.  LABORATORY STUDIES:  White count 4.2, hemoglobin 9.7, and platelet count is 144.  Sodium 135, potassium 4.0, chloride 101, bicarb 26, BUN 7, creatinine 0.5, total bilirubin is 4.1, alk phos 351, AST 86, ALT 82. From the best I can tell her last set in the liver tests were about a year ago and were essentially normal except for bilirubin of around  2 and AST of around 60.  Lipase was recently normal.  RADIOLOGIC STUDIES:  HIDA scan which I personally reviewed done today that shows avid liver uptake.  Essentially, no tracer uptake emptying into the small bowel and no visualization of the extrahepatic biliary tree.  IMPRESSION:  [name] is a 35 year old female presenting for cholecystectomy and appendectomy for biliary dyskinesia with chronic cholecystitis as well as for chronic appendicitis.  She has developed jaundice postoperatively.  HIDA is most consistent with a near  complete obstruction of her extrahepatic biliary tree.  She also intraoperatively had a nodular-appearing liver, so she might have some chronic underlying liver disease as well, especially given her chronic use of anesthesia medications, which could be playing some of a role (although not the major role) and her elevated liver test as well.  She is nontoxic appearing at this point and has no evidence of cholangitis.  PLAN: 1. I agree with supportive management with IV fluids and pain control. 2. We would proceed with MRCP as a noninvasive imaging study to help     gaze where the transition point for her extrahepatic biliary     obstruction is. 3. MRCP will be reviewed by myself as well as the surgical primary     team.  Based on these findings, it will help decide whether ERCP     would ultimately be indicated.  Thanks again for allowing me to participate in Ms. Hepp's care.     Willis Modena, MD     WO/MEDQ  D:  12/31/2010  T:  01/01/2011  Job:  [id number]  cc:   Abigail Miyamoto, M.D. Mary Sella. Andrey Campanile, MD  Electronically Signed by Willis Modena  on 01/05/2011 07:24:32 PM

## 2011-01-10 ENCOUNTER — Telehealth: Payer: Self-pay | Admitting: Family Medicine

## 2011-01-10 NOTE — Telephone Encounter (Signed)
Pt is itching from bug bites and wants to know if she can take benadryl along with her pain meds.

## 2011-01-10 NOTE — Telephone Encounter (Signed)
Patient states that while at day care yesterday she felt a throbbing sensation under her right breast.  Later it started itching and when she got home it there were two bumps near her incision.  She took a Dilaudid for the pain at that time and is now wanting to know if she can take any Benadryl.  She thinks they are bug bites.  Advised her that as long as she has not had any Dilaudid she can take 25 of the Benadryl but I'd prefer that she try the topical anti itch/hydrocorisone first.  Given that she described a sensation of pain first I told her to call me back tomorrow to let me know if the bumps were any bigger or if the pain has increased.  She was agreeable.

## 2011-01-11 ENCOUNTER — Telehealth: Payer: Self-pay | Admitting: Family Medicine

## 2011-01-11 NOTE — Telephone Encounter (Signed)
Ms. Buster wanted to know if there would be any adjustments made to her medicine regimen due to correspondence from surgeons office.  Please contact if there are any concerns at the day care center she attends during the day.  You can reach her at [phone]

## 2011-01-11 NOTE — Telephone Encounter (Signed)
Will forward to Dr Corey 

## 2011-01-11 NOTE — Telephone Encounter (Signed)
Plan is OK. Will follow prn.

## 2011-01-17 ENCOUNTER — Telehealth (INDEPENDENT_AMBULATORY_CARE_PROVIDER_SITE_OTHER): Payer: Self-pay

## 2011-01-17 NOTE — Telephone Encounter (Signed)
Pt called and c/o soreness at her incision.  The umbilical incision is most sore.  I told her this is common because it is the largest incision.  She is just about 3 weeks out.  I advised ice packs and ibuprofen or Tylenol.  Call us back if worsens before her 8-28 appt

## 2011-01-19 ENCOUNTER — Encounter (INDEPENDENT_AMBULATORY_CARE_PROVIDER_SITE_OTHER): Payer: Self-pay | Admitting: Surgery

## 2011-01-23 ENCOUNTER — Ambulatory Visit (INDEPENDENT_AMBULATORY_CARE_PROVIDER_SITE_OTHER): Payer: Medicaid Other | Admitting: Surgery

## 2011-01-23 ENCOUNTER — Encounter (INDEPENDENT_AMBULATORY_CARE_PROVIDER_SITE_OTHER): Payer: Self-pay | Admitting: Surgery

## 2011-01-23 VITALS — BP 112/63 | HR 69

## 2011-01-23 DIAGNOSIS — Z09 Encounter for follow-up examination after completed treatment for conditions other than malignant neoplasm: Secondary | ICD-10-CM

## 2011-01-23 NOTE — Progress Notes (Signed)
Subjective:     Patient ID: Phineas , female   DOB: Jun 13, 1975, 35 y.o.   MRN: [id number]  HPI She is here from her postoperative visit status post left cholecystectomy and appendectomy. At the time of surgery she was found to have some cirrhotic changes. She did have elevated liver function tests postoperatively but these were felt to be secondary to her liver disease. Her bile duct was evaluated postoperatively and found to be normal. Today she has actually no complaints. She is eating well and moving her bowels well.  Review of Systems     Objective:   Physical Exam On exam, her incisions are well healed. Her abdomen is soft and nontender. She has no evidence of jaundice.    Assessment:     Patient status post laparoscopic cholecystectomy and laparoscopic appendectomy with the findings of chronic inflammation in both as well as the appearance of cirrhosis in her liver.    Plan:     I again explained this to her. She will be continued followup with her primary care physician. I will see her back as needed. She may return to normal activity.

## 2011-01-25 NOTE — Telephone Encounter (Signed)
No changes

## 2011-01-26 ENCOUNTER — Ambulatory Visit: Payer: Medicaid Other | Admitting: Family Medicine

## 2011-02-05 ENCOUNTER — Other Ambulatory Visit (HOSPITAL_COMMUNITY): Payer: Medicaid Other

## 2011-02-07 ENCOUNTER — Ambulatory Visit: Payer: Medicaid Other | Admitting: Family Medicine

## 2011-02-09 ENCOUNTER — Encounter: Payer: Self-pay | Admitting: Family Medicine

## 2011-02-09 ENCOUNTER — Ambulatory Visit (INDEPENDENT_AMBULATORY_CARE_PROVIDER_SITE_OTHER): Payer: Medicaid Other | Admitting: Family Medicine

## 2011-02-09 VITALS — BP 123/85 | HR 96 | Wt 144.0 lb

## 2011-02-09 DIAGNOSIS — R5383 Other fatigue: Secondary | ICD-10-CM | POA: Insufficient documentation

## 2011-02-09 DIAGNOSIS — Z23 Encounter for immunization: Secondary | ICD-10-CM

## 2011-02-09 DIAGNOSIS — R5381 Other malaise: Secondary | ICD-10-CM

## 2011-02-09 DIAGNOSIS — B373 Candidiasis of vulva and vagina: Secondary | ICD-10-CM

## 2011-02-09 DIAGNOSIS — B3731 Acute candidiasis of vulva and vagina: Secondary | ICD-10-CM

## 2011-02-09 LAB — CBC
HCT: 30.3 % — ABNORMAL LOW (ref 36.0–46.0)
Hemoglobin: 10.3 g/dL — ABNORMAL LOW (ref 12.0–15.0)
MCH: 28.8 pg (ref 26.0–34.0)
MCHC: 34 g/dL (ref 30.0–36.0)
MCV: 84.6 fL (ref 78.0–100.0)
RBC: 3.58 MIL/uL — ABNORMAL LOW (ref 3.87–5.11)

## 2011-02-09 MED ORDER — FLUCONAZOLE 150 MG PO TABS: 150.0000 mg | ORAL_TABLET | Freq: Once | ORAL | Status: AC

## 2011-02-09 NOTE — Progress Notes (Signed)
Ms. Joanette Gula presents to clinic today for fatigue and question of yeast infection.  Fatigue:  Noted following her abdominal surgery for cholecystectomy and appendectomy. She was told that her hemoglobin level is low. She notes that she is a bit sleepy during the day, and lacks energy. She denies feeling cold, or hair loss. She's taking iron one time a day.  Vaginal itching: Her vaginal itching started back September 1. She denies any discharge. She notes that she has been taking some antibiotics for a dental abscess. She denies any abdominal pain or fevers.  PMH reviewed.  ROS as above otherwise neg Medications reviewed.  Exam:  BP 123/85  Pulse 96  Wt 144 lb (65.318 kg) Gen: Well NAD HEENT: EOMI,  MMM Lungs: CTABL Nl WOB Heart: RRR no MRG Abd: NABS, NT, ND Exts: Non edematous BL  LE, warm and well perfused.  GYN: External genitalia are slightly irritated with white material.   Wet mount yeast noted along with squamous epithelial cells.

## 2011-02-09 NOTE — Patient Instructions (Signed)
Thank you for coming in today. I am checking you for low iron and thyroid problem. If I see a problem I will call you. I want to see back in one month.  I think you have a yeast infection today.  I am treating this with fluconazole pill.  Take it once, if you still have symptoms in one week take it again. I gave he one refill.

## 2011-02-09 NOTE — Assessment & Plan Note (Signed)
Fatigue and possibly multifactorial.  We'll assess for anemia or hypothyroidism today with CBC and TSH. We will followup in one month.

## 2011-02-09 NOTE — Assessment & Plan Note (Signed)
Repeat yeast infection. Plan to treat with fluconazole orally. We'll followup in one month

## 2011-02-12 ENCOUNTER — Ambulatory Visit: Payer: Medicaid Other | Admitting: Family Medicine

## 2011-02-21 ENCOUNTER — Other Ambulatory Visit: Payer: Self-pay | Admitting: *Deleted

## 2011-02-21 DIAGNOSIS — D649 Anemia, unspecified: Secondary | ICD-10-CM

## 2011-02-21 MED ORDER — MONTELUKAST SODIUM 10 MG PO TABS: 10.0000 mg | ORAL_TABLET | Freq: Every day | ORAL | Status: DC

## 2011-02-21 MED ORDER — FERROUS SULFATE 325 (65 FE) MG PO TABS: 325.0000 mg | ORAL_TABLET | Freq: Every day | ORAL | Status: DC

## 2011-02-27 ENCOUNTER — Ambulatory Visit (HOSPITAL_COMMUNITY)
Admission: RE | Admit: 2011-02-27 | Discharge: 2011-02-27 | Disposition: A | Discharge status: Home / Self Care | Payer: Medicaid Other | Source: Ambulatory Visit | Attending: Gastroenterology | Admitting: Gastroenterology

## 2011-02-28 ENCOUNTER — Ambulatory Visit (HOSPITAL_COMMUNITY)
Admission: RE | Admit: 2011-02-28 | Discharge: 2011-02-28 | Disposition: A | Discharge status: Home / Self Care | Payer: Medicaid Other | Source: Ambulatory Visit | Attending: Gastroenterology | Admitting: Gastroenterology

## 2011-02-28 DIAGNOSIS — R7989 Other specified abnormal findings of blood chemistry: Secondary | ICD-10-CM | POA: Insufficient documentation

## 2011-02-28 DIAGNOSIS — R109 Unspecified abdominal pain: Secondary | ICD-10-CM | POA: Insufficient documentation

## 2011-02-28 DIAGNOSIS — J45909 Unspecified asthma, uncomplicated: Secondary | ICD-10-CM | POA: Insufficient documentation

## 2011-02-28 DIAGNOSIS — K219 Gastro-esophageal reflux disease without esophagitis: Secondary | ICD-10-CM | POA: Insufficient documentation

## 2011-02-28 DIAGNOSIS — R161 Splenomegaly, not elsewhere classified: Secondary | ICD-10-CM | POA: Insufficient documentation

## 2011-03-08 ENCOUNTER — Other Ambulatory Visit (HOSPITAL_COMMUNITY): Payer: Self-pay | Admitting: Gastroenterology

## 2011-03-08 DIAGNOSIS — R7989 Other specified abnormal findings of blood chemistry: Secondary | ICD-10-CM

## 2011-03-08 DIAGNOSIS — R945 Abnormal results of liver function studies: Secondary | ICD-10-CM

## 2011-03-12 ENCOUNTER — Ambulatory Visit (INDEPENDENT_AMBULATORY_CARE_PROVIDER_SITE_OTHER): Payer: Medicaid Other | Admitting: Family Medicine

## 2011-03-12 ENCOUNTER — Encounter: Payer: Self-pay | Admitting: Family Medicine

## 2011-03-12 DIAGNOSIS — R5381 Other malaise: Secondary | ICD-10-CM

## 2011-03-12 DIAGNOSIS — F450 Somatization disorder: Secondary | ICD-10-CM

## 2011-03-12 DIAGNOSIS — R5383 Other fatigue: Secondary | ICD-10-CM

## 2011-03-12 NOTE — Progress Notes (Signed)
Ms. [name] is a 35 year old woman with intellectual disability and a diagnosis of somatoform disorder . Followup vaginal itching and fatigue   Vagingal itching improved.   Fatigue: Improved.   In the interim she notes that her gastroenterologist as started cholestyramine for diarrhea following a laparoscopic cholecystectomy.  She feels well has no complaints today.  PMH reviewed.  ROS as above otherwise neg Medications reviewed.  Exam:  BP 113/76  Pulse 87  Wt 145 lb (65.772 kg)  LMP 03/05/2011 Gen: Well NAD HEENT: EOMI,  MMM Lungs: CTABL Nl WOB Heart: RRR no MRG Abd: NABS, NT, ND, incisions with mature scar well appearing Exts: Non edematous BL  LE, warm and well perfused.

## 2011-03-12 NOTE — Patient Instructions (Signed)
Thank you for coming in today. Please come back in 3 months for a recheck.  Take care.

## 2011-03-12 NOTE — Assessment & Plan Note (Signed)
Improved will continue to follow. 

## 2011-03-12 NOTE — Assessment & Plan Note (Signed)
Plan for frequent office visits

## 2011-03-15 ENCOUNTER — Telehealth: Payer: Self-pay | Admitting: Family Medicine

## 2011-03-15 NOTE — Telephone Encounter (Signed)
Can you fill out form for Lorea or dose she need to come in for complete physical?

## 2011-03-15 NOTE — Telephone Encounter (Signed)
[name] is calling to speak to a nurse.  She saw Dr. Denyse Amass on 10/15, she has a form that needs to be completed for her Annual Exam.  She wants to know if she can drop it off for him to complete.

## 2011-03-16 NOTE — Telephone Encounter (Signed)
She can drop the form off. I will fill it out.  She does not need a new visit

## 2011-03-16 NOTE — Telephone Encounter (Signed)
Left message for patient to drop off form to be completed.[name], [name]

## 2011-03-19 ENCOUNTER — Other Ambulatory Visit (HOSPITAL_COMMUNITY): Payer: Medicaid Other

## 2011-03-20 ENCOUNTER — Ambulatory Visit (HOSPITAL_COMMUNITY)
Admission: RE | Admit: 2011-03-20 | Discharge: 2011-03-20 | Disposition: A | Discharge status: Home / Self Care | Payer: Medicaid Other | Source: Ambulatory Visit | Attending: Gastroenterology | Admitting: Gastroenterology

## 2011-03-20 ENCOUNTER — Telehealth: Payer: Self-pay | Admitting: Family Medicine

## 2011-03-20 DIAGNOSIS — R7402 Elevation of levels of lactic acid dehydrogenase (LDH): Secondary | ICD-10-CM | POA: Insufficient documentation

## 2011-03-20 DIAGNOSIS — R945 Abnormal results of liver function studies: Secondary | ICD-10-CM

## 2011-03-20 DIAGNOSIS — R7401 Elevation of levels of liver transaminase levels: Secondary | ICD-10-CM | POA: Insufficient documentation

## 2011-03-20 DIAGNOSIS — Z01812 Encounter for preprocedural laboratory examination: Secondary | ICD-10-CM | POA: Insufficient documentation

## 2011-03-20 DIAGNOSIS — Z538 Procedure and treatment not carried out for other reasons: Secondary | ICD-10-CM | POA: Insufficient documentation

## 2011-03-20 LAB — CBC
HCT: 30.9 % — ABNORMAL LOW (ref 36.0–46.0)
MCH: 29.1 pg (ref 26.0–34.0)
MCHC: 35 g/dL (ref 30.0–36.0)
MCV: 83.3 fL (ref 78.0–100.0)
Platelets: 161 10*3/uL (ref 150–400)
RDW: 14.7 % (ref 11.5–15.5)

## 2011-03-20 NOTE — Telephone Encounter (Signed)
LVM for pt to rt our call. 

## 2011-03-20 NOTE — Telephone Encounter (Signed)
Pt states they will try to get ahold of Dr Dulce Sellar to see why her blood is so thin and try to correct it, will call us when she gets her bx resched.

## 2011-03-20 NOTE — Telephone Encounter (Signed)
Pt went to have her liver biopsy today and they couldn't do it b/c her blood was too thin.  Would like to talk to nurse

## 2011-03-21 NOTE — Telephone Encounter (Signed)
Will follow.

## 2011-03-22 ENCOUNTER — Telehealth: Payer: Self-pay | Admitting: Family Medicine

## 2011-03-22 NOTE — Telephone Encounter (Signed)
Needs to talk to nurse about her chest tightness, wheezing and sneezing - wants to know what she can do.  It takes 3 days for transportation to bring her.

## 2011-03-22 NOTE — Telephone Encounter (Signed)
States yesterday while at daycare she had some chest tightness. She used her Proventil inhaler last night and today and this has helped the chest . Does not feel tight now. Reports head congestion  and stuffiness.  and body aching . Advised tylenol for aches and cool mist humifier  ( she has one  ). Dr. Denyse Amass states may take 500 mg every 6 hours of tylenol if needed,. Patient will contact her allergist about use of her  inhaler. Also appointment scheduled for Monday 10/29.

## 2011-03-26 ENCOUNTER — Ambulatory Visit: Payer: Medicaid Other

## 2011-03-29 ENCOUNTER — Telehealth: Payer: Self-pay | Admitting: *Deleted

## 2011-03-29 NOTE — Telephone Encounter (Signed)
Patient calls for two reasons.  1) she started on Ovcon -35 in May 2012  and has had a regular period the last week of the pills each month. . She started on last row of pills this time on Sunday 10/29 and still has not started period.  Consulted with Dr. Deirdre Priest and he states not uncommon  After being on pills for 6 months for period to be very light or no period. Advised to start next pack at regular time.    2) complains with  right upper abdominal discomfort and nausea. States pain is where her gallbladder was over  the liver , feels hard and swollen.  States there are more people in her Daycare now and she has to wait longer to use bathroom so holds her urine and BM  Longer.  Appointment scheduled at patient's request for next Wed 11/07. She has to allow 3 days for transportation to be scheduled.

## 2011-03-29 NOTE — Telephone Encounter (Signed)
Pt left message stating she has a question about her medication. Please call back ASAP.  I called pt and she stated that she did not have a period this month during the white pills (inactive) of her pack. She did have a period earlier in the month while she was taking the hormone pills and this has happened during other months as well. She want to know if this is normal. I told her that sometimes this will happen, especially if she misses or skips pills periodically. Pt acknowledged that she does occasionally miss pills and then takes them on the next day along with the current dose. I explained how this scenario will mess up her menstrual cycle if it happens too many times in 1 month. She needs to take 1 pill per day !  I asked pt to be more consistent with the next pack of pills which she will start in a few days. Pt agreed. Pt also would like a follow up appt to see the doctor about her irregular bleeding and states she needs an endometrial biopsy. I told her that I will calll back tomorrow with appt info. Pt voiced understanding.

## 2011-04-04 ENCOUNTER — Encounter: Payer: Self-pay | Admitting: Family Medicine

## 2011-04-04 ENCOUNTER — Ambulatory Visit (INDEPENDENT_AMBULATORY_CARE_PROVIDER_SITE_OTHER): Payer: Medicaid Other | Admitting: Family Medicine

## 2011-04-04 ENCOUNTER — Ambulatory Visit: Payer: Medicaid Other | Admitting: Family Medicine

## 2011-04-04 VITALS — BP 125/84 | HR 91 | Temp 98.1°F | Ht 61.0 in | Wt 145.0 lb

## 2011-04-04 DIAGNOSIS — R5381 Other malaise: Secondary | ICD-10-CM

## 2011-04-04 DIAGNOSIS — R945 Abnormal results of liver function studies: Secondary | ICD-10-CM

## 2011-04-04 DIAGNOSIS — R300 Dysuria: Secondary | ICD-10-CM

## 2011-04-04 DIAGNOSIS — N898 Other specified noninflammatory disorders of vagina: Secondary | ICD-10-CM

## 2011-04-04 DIAGNOSIS — R5383 Other fatigue: Secondary | ICD-10-CM

## 2011-04-04 LAB — POCT WET PREP (WET MOUNT): Trichomonas Wet Prep HPF POC: NEGATIVE

## 2011-04-04 LAB — POCT URINALYSIS DIPSTICK
Blood, UA: NEGATIVE
Glucose, UA: NEGATIVE
Ketones, UA: NEGATIVE
Nitrite, UA: NEGATIVE
Spec Grav, UA: 1.02
pH, UA: 7.5

## 2011-04-04 LAB — CBC
HCT: 33.3 % — ABNORMAL LOW (ref 36.0–46.0)
Hemoglobin: 11.2 g/dL — ABNORMAL LOW (ref 12.0–15.0)
MCV: 85.4 fL (ref 78.0–100.0)
RDW: 14.4 % (ref 11.5–15.5)
WBC: 4.7 10*3/uL (ref 4.0–10.5)

## 2011-04-04 LAB — POCT INR: INR: 1.8

## 2011-04-04 MED ORDER — FLUCONAZOLE 150 MG PO TABS: 150.0000 mg | ORAL_TABLET | Freq: Once | ORAL | Status: DC

## 2011-04-04 NOTE — Assessment & Plan Note (Signed)
Patient with liver disease, followed by Dr Dulce Sellar and planned for liver biopsy.  Her INR was 3.1 before, now is 1.8 today ("ate greens").  Plans for vitamin K prior to her scheduled liver biopsy on 11/15. Will recheck CMet today.

## 2011-04-04 NOTE — Assessment & Plan Note (Signed)
Patient wet prep with hyphae; I called her after visit at [phone] to explain, prescribe Diflucan 150mg  tab, take one and repeat in 1 week as needed.  Will contact with lab results drawn today.

## 2011-04-04 NOTE — Progress Notes (Signed)
  Subjective:    Patient ID: [name], female    DOB: 11-09-1975, 35 y.o.   MRN: [id number]  HPI Patient is booked for a same day appointment for complaint of fatigue as well as vaginal itch. She complains that she has had some fatigue for about the past one week preceded by a head cold that started on October 23. At that time she began with nasal congestion and chest congestion but denies any cough or fever. She did have some wheezing which she attributed to her asthma and under the direction of her pulmonologist increased her Qvar to 2 puffs twice a day. After 3 days she resumed her one puff daily regimen.  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 Dulce Sellar. She had plans to have a liver biopsy done recently, however was found to have an elevated INR and biopsy was deferred until the 15th of this month. She was given a prescription for vitamin K orally to take 2.5 mg daily beginning 3 days before the biopsy.  As for the vaginal discharge, it is reminiscent of her prior episode of vaginal yeast infection diagnosed in September and treated successfully with fluconazole. She is not sexually active and denies any vaginal pain. On review of systems she does report polyuria but denies dysuria. She reports a history of urinary tract infection about a year ago   Review of Systems Denies fevers or chills, denies abdominal pain at this time. She reports any cough or dyspnea at the present time.    Objective:   Physical Exam        Assessment & Plan:

## 2011-04-04 NOTE — Assessment & Plan Note (Signed)
Patient with fatigue; similar to her presentation in September on Dr Corey's visit note. Given elevated INR, will check CBC today.  LMP Oct 15th, for 7 days, usual (not heavy) flow.  Taking FeSO4 1 tablet daily.  No hematochezia or nausea/vomiting. For follow up with Dr Denyse Amass.

## 2011-04-04 NOTE — Telephone Encounter (Signed)
I called and left message on pt voice mail that we have an opening this afternoon @ 1330 if that is convenient. If not, then we have open appts on 05/03/11. Please call back to the appt line to schedule the appt.

## 2011-04-05 ENCOUNTER — Telehealth: Payer: Self-pay | Admitting: Family Medicine

## 2011-04-05 LAB — COMPREHENSIVE METABOLIC PANEL
AST: 116 U/L — ABNORMAL HIGH (ref 0–37)
Albumin: 3.9 g/dL (ref 3.5–5.2)
BUN: 12 mg/dL (ref 6–23)
Calcium: 8.2 mg/dL — ABNORMAL LOW (ref 8.4–10.5)
Chloride: 100 mEq/L (ref 96–112)
Creat: 0.44 mg/dL — ABNORMAL LOW (ref 0.50–1.10)
Glucose, Bld: 91 mg/dL (ref 70–99)
Potassium: 4.8 mEq/L (ref 3.5–5.3)

## 2011-04-05 NOTE — Telephone Encounter (Signed)
I called patient, answering machine.  I left non-urgent message about lab work.  Of note, Hgb is improved, transaminases and ALk Phos are worse.  Plans for liver biopsy with Dr Dulce Sellar next week.  [name]

## 2011-04-05 NOTE — Telephone Encounter (Signed)
Patient asking to be mailed a med & dx list, forgot it when she was here last.

## 2011-04-05 NOTE — Telephone Encounter (Signed)
Patient is returning call, would like someone to call her back re: results.

## 2011-04-06 ENCOUNTER — Encounter: Payer: Self-pay | Admitting: Family Medicine

## 2011-04-06 ENCOUNTER — Encounter (HOSPITAL_COMMUNITY): Payer: Self-pay | Admitting: Pharmacy Technician

## 2011-04-06 NOTE — Telephone Encounter (Signed)
This encounter was created in error - please disregard.

## 2011-04-06 NOTE — Telephone Encounter (Signed)
error 

## 2011-04-06 NOTE — Telephone Encounter (Signed)
Mailed. .[name]  

## 2011-04-09 ENCOUNTER — Telehealth: Payer: Self-pay | Admitting: Family Medicine

## 2011-04-09 NOTE — Telephone Encounter (Signed)
Is coughing and now has green mucus - also having some diarrhea today also ( not sure if it's from yeast inf meds) She was seen last week and is not sure what to do.

## 2011-04-09 NOTE — Telephone Encounter (Signed)
Patient began coughing this am and says that her congestion has increased.  Is not running a fever.  Explained that this was simply a cold and the best way to treat it was rest, fluids and OTC cough syrup.  Advised her to stay home tomorrow from daycare and to see if she could make her way to the pharmacy for Robitussin.  Told her the diarrhea was likely coming from the Diflucan and once she finishes the medication it should resolve.

## 2011-04-11 ENCOUNTER — Other Ambulatory Visit: Payer: Self-pay | Admitting: Family Medicine

## 2011-04-11 NOTE — Telephone Encounter (Signed)
Refill request

## 2011-04-12 ENCOUNTER — Ambulatory Visit (HOSPITAL_COMMUNITY): Admission: RE | Admit: 2011-04-12 | Payer: Medicaid Other | Source: Ambulatory Visit

## 2011-04-13 ENCOUNTER — Other Ambulatory Visit: Payer: Self-pay | Admitting: Family Medicine

## 2011-04-13 MED ORDER — FLUTICASONE PROPIONATE 50 MCG/ACT NA SUSP: 2.0000 | Freq: Every day | NASAL | Status: DC

## 2011-04-23 ENCOUNTER — Encounter (HOSPITAL_COMMUNITY): Payer: Self-pay | Admitting: Emergency Medicine

## 2011-04-23 ENCOUNTER — Emergency Department (HOSPITAL_COMMUNITY)
Admission: EM | Admit: 2011-04-23 | Discharge: 2011-04-23 | Disposition: A | Discharge status: Home Health | Payer: Medicaid Other | Attending: Emergency Medicine | Admitting: Emergency Medicine

## 2011-04-23 ENCOUNTER — Other Ambulatory Visit: Payer: Self-pay | Admitting: Neurology

## 2011-04-23 ENCOUNTER — Emergency Department (HOSPITAL_COMMUNITY): Payer: Medicaid Other

## 2011-04-23 DIAGNOSIS — J45901 Unspecified asthma with (acute) exacerbation: Secondary | ICD-10-CM | POA: Insufficient documentation

## 2011-04-23 DIAGNOSIS — R0602 Shortness of breath: Secondary | ICD-10-CM | POA: Insufficient documentation

## 2011-04-23 DIAGNOSIS — R059 Cough, unspecified: Secondary | ICD-10-CM

## 2011-04-23 DIAGNOSIS — Z79899 Other long term (current) drug therapy: Secondary | ICD-10-CM | POA: Insufficient documentation

## 2011-04-23 DIAGNOSIS — R05 Cough: Secondary | ICD-10-CM

## 2011-04-23 MED ORDER — ALBUTEROL SULFATE (5 MG/ML) 0.5% IN NEBU
2.5000 mg | INHALATION_SOLUTION | Freq: Once | RESPIRATORY_TRACT | Status: AC
  Administered 2011-04-23: 2.5 mg via RESPIRATORY_TRACT
  Filled 2011-04-23: qty 1

## 2011-04-23 MED ORDER — HYDROCOD POLST-CHLORPHEN POLST 10-8 MG/5ML PO LQCR: 5.0000 mL | Freq: Two times a day (BID) | ORAL | Status: DC | PRN

## 2011-04-23 MED ORDER — IPRATROPIUM BROMIDE 0.02 % IN SOLN
0.5000 mg | Freq: Once | RESPIRATORY_TRACT | Status: AC
  Administered 2011-04-23: 0.5 mg via RESPIRATORY_TRACT
  Filled 2011-04-23: qty 2.5

## 2011-04-23 MED ORDER — ALBUTEROL SULFATE (5 MG/ML) 0.5% IN NEBU
2.5000 mg | INHALATION_SOLUTION | Freq: Once | RESPIRATORY_TRACT | Status: AC
  Administered 2011-04-23: 2.5 mg via RESPIRATORY_TRACT

## 2011-04-23 MED ORDER — HYDROCOD POLST-CHLORPHEN POLST 10-8 MG/5ML PO LQCR
5.0000 mL | Freq: Once | ORAL | Status: AC
  Administered 2011-04-23: 5 mL via ORAL
  Filled 2011-04-23: qty 5

## 2011-04-23 NOTE — ED Notes (Signed)
DR. Norlene Campbell AT BEDSIDE SPEAKING WITH PT. ON DISCHARGE PLAN.

## 2011-04-23 NOTE — ED Notes (Signed)
C/o sob and wheezing x 2 weeks.  States she got better but symptoms started back Wednesday night.  Using inhalers at home without relief.

## 2011-04-23 NOTE — ED Provider Notes (Addendum)
History     CSN: [id number] Arrival date & time: 04/23/2011  3:19 AM   First MD Initiated Contact with Patient 04/23/11 [id number]      Chief Complaint  Patient presents with  . Asthma    (Consider location/radiation/quality/duration/timing/severity/associated sxs/prior treatment) HPI 35 year old female presents to the emergency department with complaint of cough and wheezing. Patient reports she's been having intermittent cough and wheezing over the last several weeks. Patient was seen by her primary care doctor about a week ago and put on prednisone for 3 days. She reports she did not get better with prednisone, and was placed on Z-Pak. Patient reports she was feeling better until Wednesday, when she came in contact with burning leaves smoke. She reports this area to terminate her cough and wheeze. Patient felt better Thursday had some coughing and wheeze on Friday. Patient reports she used her inhaler twice yesterday and 3 times today. She has not had fever. Cough is nonproductive. Past Medical History  Diagnosis Date  . Allergy   . Anemia   . Seizures   . Intellectual disability   . Constipation   . Asthma   . Abnormal vaginal bleeding     Past Surgical History  Procedure Date  . Appendectomy   . Cholecystectomy     No family history on file.  History  Substance Use Topics  . Smoking status: Never Smoker   . Smokeless tobacco: Never Used  . Alcohol Use: No    OB History    Grav Para Term Preterm Abortions TAB SAB Ect Mult Living                  Review of Systems  All other systems reviewed and are negative.     Allergies  Aspirin; Penicillins; Pseudoephedrine; Sulfa antibiotics; and Tramadol hcl  Home Medications   Current Outpatient Rx  Name Route Sig Dispense Refill  . ALBUTEROL 90 MCG/ACT IN AERS Inhalation Inhale 2 puffs into the lungs every 4 (four) hours as needed. For wheezing    . AZELASTINE HCL 0.15 % NA SOLN Nasal Place 2 sprays into the nose  daily.     . BECLOMETHASONE DIPROPIONATE 80 MCG/ACT IN AERS Inhalation Inhale 1 puff into the lungs as needed. 1 Inhaler 12  . BUDESONIDE-FORMOTEROL FUMARATE 160-4.5 MCG/ACT IN AERO Inhalation Inhale 2 puffs into the lungs 2 (two) times daily.      Marland Kitchen CALCIUM CARBONATE-VITAMIN D 250-125 MG-UNIT PO TABS Oral Take 1 tablet by mouth daily.      Marland Kitchen CARBAMAZEPINE 400 MG PO TB12 Oral Take 400 mg by mouth 2 (two) times daily.      Marland Kitchen CETIRIZINE HCL 10 MG PO TABS Oral Take 10 mg by mouth every morning.     . CHOLESTYRAMINE LIGHT 4 G PO PACK Oral Take 4 g by mouth 2 (two) times daily.      Marland Kitchen DIVALPROEX SODIUM 250 MG PO TBEC Oral Take 250 mg by mouth 3 (three) times daily. Take 1 tablet in the morning, 1 tablet at noon, and 2 tablets at bedtime    . FERROUS SULFATE 325 (65 FE) MG PO TABS Oral Take 1 tablet (325 mg total) by mouth daily with breakfast. 30 tablet 3  . FLUTICASONE PROPIONATE 50 MCG/ACT NA SUSP Nasal Place 2 sprays into the nose daily. 16 g 10  . MONTELUKAST SODIUM 10 MG PO TABS Oral Take 1 tablet (10 mg total) by mouth daily. 30 tablet 12  . NORETHINDRONE-ETH ESTRADIOL 0.4-35 MG-MCG  PO TABS Oral Take 1 tablet by mouth daily.     Marland Kitchen OMEPRAZOLE 40 MG PO CPDR Oral Take 40 mg by mouth 2 (two) times daily.     Marland Kitchen OVER THE COUNTER MEDICATION Both Eyes Place 1 drop into both eyes at bedtime. Gentle tear eye gel     . POLYETHYL GLYCOL-PROPYL GLYCOL 0.4-0.3 % OP SOLN Both Eyes Place 2 drops into both eyes 4 (four) times daily as needed. For dry eyes     . POLYETHYLENE GLYCOL 3350 PO PACK Oral Take 17 g by mouth daily as needed. Mix into 8 ounces of fluid-for constipation     . SELENIUM SULFIDE 2.5 % EX LOTN Topical Apply topically daily. as directed    . SODIUM CHLORIDE 0.9 % IN AERS Nebulization Take 1 spray by nebulization 2 (two) times daily.      . AZITHROMYCIN 250 MG PO TABS Oral Take 250 mg by mouth daily. Took for 5 days-last dose 04/20/11       BP 104/64  Pulse 100  Temp(Src) 98.2 F (36.8 C)  (Oral)  Resp 18  Ht 5\' 3"  (1.6 m)  Wt 145 lb (65.772 kg)  BMI 25.69 kg/m2  SpO2 100%  LMP 04/12/2011  Physical Exam  Nursing note and vitals reviewed. Constitutional: She is oriented to person, place, and time. She appears well-developed and well-nourished.  HENT:  Head: Normocephalic and atraumatic.  Right Ear: External ear normal.  Left Ear: External ear normal.  Nose: Nose normal.  Mouth/Throat: Oropharynx is clear and moist.  Eyes: Conjunctivae and EOM are normal. Pupils are equal, round, and reactive to light.  Neck: Normal range of motion. Neck supple. No JVD present. No tracheal deviation present. No thyromegaly present.  Cardiovascular: Normal rate, regular rhythm, normal heart sounds and intact distal pulses.  Exam reveals no gallop and no friction rub.   No murmur heard. Pulmonary/Chest: Effort normal and breath sounds normal. No stridor. No respiratory distress. She has no wheezes. She has no rales. She exhibits no tenderness.       Patient has occasional dry cough with forced expiration against closed glottis causing wheezing-like sound in the throat. Patient has no lower airway wheezing  Abdominal: Soft. Bowel sounds are normal. She exhibits no distension and no mass. There is no tenderness. There is no rebound and no guarding.  Musculoskeletal: Normal range of motion. She exhibits no edema and no tenderness.  Lymphadenopathy:    She has no cervical adenopathy.  Neurological: She is oriented to person, place, and time. She exhibits normal muscle tone. Coordination normal.  Skin: Skin is dry. No rash noted. No erythema. No pallor.  Psychiatric: She has a normal mood and affect. Her behavior is normal. Judgment and thought content normal.    ED Course  Procedures (including critical care time)  Labs Reviewed - No data to display Dg Chest 2 View  04/23/2011  *RADIOLOGY REPORT*  Clinical Data: Shortness of breath, cough and wheezing; history of asthma.  CHEST - 2 VIEW   Comparison: Chest radiograph performed 12/22/2010  Findings: The lungs are mildly hypoexpanded; mild left basilar airspace opacity may reflect atelectasis or possibly mild pneumonia.  There is no evidence of pleural effusion or pneumothorax.  The heart is normal in size; the mediastinal contour is within normal limits.  No acute osseous abnormalities are seen.  IMPRESSION: Lungs mildly hypoexpanded; mild left basilar opacity may reflect atelectasis or possibly mild pneumonia.  Original Report Authenticated By: Tonia Ghent, M.D.  No diagnosis found.    MDM  35 year old female with cough and wheeze. Patient had received nebulized treatment prior to my evaluation no wheezing noted. Patient did have upper airway noise when exhaling against closed glottis. Will check chest x-ray. Will have patient use her inhalers scheduled every 4 hours and add on Tussionex for cough. We'll refer patient to her pulmonologist for recheck        Olivia Mackie, MD 04/23/11 0518  5:52 AM Chest x-ray with mild left basilar opacity. Concern for possible mild pneumonia. Patient just finished dose of Z-Pak does not have fever. Do not feel this is consistent with a pneumonia. We'll continue the current plan of control of cough and scheduled albuterol and have patient followup with pulmonologist  Olivia Mackie, MD 04/23/11 [phone]

## 2011-04-23 NOTE — ED Notes (Signed)
Patient transported to X-ray 

## 2011-04-23 NOTE — ED Notes (Signed)
ASSUMED CARE ON PT. INTRODUCED SELF , CALL LIGHT WITHIN REACH , BEDRAILS UP ,  WARM BLANKET PROVIDED,  RESPIRATIONS UNLABORED, O2SAT 100% ROOM AIR. WAITING FOR EDP/PA EVALUATION , EXPALINED PROCESS AND DELAY.

## 2011-04-24 ENCOUNTER — Encounter (HOSPITAL_COMMUNITY): Payer: Self-pay

## 2011-04-24 ENCOUNTER — Ambulatory Visit (HOSPITAL_COMMUNITY)
Admission: RE | Admit: 2011-04-24 | Discharge: 2011-04-24 | Disposition: A | Discharge status: Home / Self Care | Payer: Medicaid Other | Source: Ambulatory Visit | Attending: Gastroenterology | Admitting: Gastroenterology

## 2011-04-24 ENCOUNTER — Telehealth: Payer: Self-pay | Admitting: Family Medicine

## 2011-04-24 DIAGNOSIS — R7989 Other specified abnormal findings of blood chemistry: Secondary | ICD-10-CM | POA: Insufficient documentation

## 2011-04-24 DIAGNOSIS — K759 Inflammatory liver disease, unspecified: Secondary | ICD-10-CM | POA: Insufficient documentation

## 2011-04-24 DIAGNOSIS — R945 Abnormal results of liver function studies: Secondary | ICD-10-CM

## 2011-04-24 HISTORY — PX: IR US LIVER BIOPSY: IMG936

## 2011-04-24 LAB — APTT: aPTT: 55 seconds — ABNORMAL HIGH (ref 24–37)

## 2011-04-24 LAB — CBC
HCT: 30.7 % — ABNORMAL LOW (ref 36.0–46.0)
Hemoglobin: 10.5 g/dL — ABNORMAL LOW (ref 12.0–15.0)
MCHC: 34.2 g/dL (ref 30.0–36.0)
RBC: 3.63 MIL/uL — ABNORMAL LOW (ref 3.87–5.11)
WBC: 3.9 10*3/uL — ABNORMAL LOW (ref 4.0–10.5)

## 2011-04-24 LAB — PROTIME-INR
INR: 1.79 — ABNORMAL HIGH (ref 0.00–1.49)
Prothrombin Time: 21.1 seconds — ABNORMAL HIGH (ref 11.6–15.2)

## 2011-04-24 MED ORDER — MIDAZOLAM HCL 5 MG/5ML IJ SOLN
INTRAMUSCULAR | Status: AC | PRN
  Administered 2011-04-24: 1 mg via INTRAVENOUS

## 2011-04-24 MED ORDER — SODIUM CHLORIDE 0.9 % IV SOLN
INTRAVENOUS | Status: DC
  Administered 2011-04-24: 09:00:00 via INTRAVENOUS

## 2011-04-24 MED ORDER — FENTANYL CITRATE 0.05 MG/ML IJ SOLN
INTRAMUSCULAR | Status: AC | PRN
  Administered 2011-04-24: 25 ug via INTRAVENOUS

## 2011-04-24 MED ORDER — MIDAZOLAM HCL 2 MG/2ML IJ SOLN
INTRAMUSCULAR | Status: AC
  Filled 2011-04-24: qty 4

## 2011-04-24 MED ORDER — FENTANYL CITRATE 0.05 MG/ML IJ SOLN
INTRAMUSCULAR | Status: AC
  Filled 2011-04-24: qty 4

## 2011-04-24 NOTE — Procedures (Signed)
Procedure : random liver core needle biopsy Specimen : 18 gauge cores x 3 Complications : none immediate

## 2011-04-24 NOTE — Telephone Encounter (Signed)
[name]  Had headache before going to ED but feels like it is not getting better.  Patient informed that cough med usually does not cause headache.  Pain on left side of head.Rates headache at 8/10. She is unable to take ibuprofen or NSAIDS due to "blood too thin" and unable to take Tylenol due to elevated liver enzymes.  Had liver biopsy today.  Patient says she will wait and see Dr. Willa Rough on 04/26/11 and discuss headache since her insurance only pays for 22 visits.  Will call our office back as needed. [name]

## 2011-04-24 NOTE — Progress Notes (Addendum)
Pt back form radiology s/p liver bx. Band-Aid CDI .  Bedrest order explained to pt. Call bell in reach. Pt eating a sandwich and drink. Denies pain.

## 2011-04-24 NOTE — Telephone Encounter (Signed)
Pt was given chlorpheniramine-HYDROcodone [name] [name]) at the ED on Sunday- she has pneumonia. She states that when she takes it, it gives her a headache and wants to know if this is normal.  Wants to know if it is because she takes it with the tegretol.

## 2011-04-24 NOTE — H&P (Signed)
[name] is an 35 y.o. female.   Chief Complaint: elevated liver functions HPI: elevated liver functions. Being followed by GI. Request for random liver core biopsy has been requested.   Past Medical History  Diagnosis Date  . Allergy   . Anemia   . Seizures   . Intellectual disability   . Constipation   . Asthma   . Abnormal vaginal bleeding     Past Surgical History  Procedure Date  . Appendectomy   . Cholecystectomy   . Eye muscle surgery   . Eustacean tubes   . Colonoscopy w/ biopsies     No family history on file. Social History:  reports that she has never smoked. She has never used smokeless tobacco. She reports that she does not drink alcohol or use illicit drugs.  Allergies:  Allergies  Allergen Reactions  . Aspirin Nausea And Vomiting  . Latex Itching  . Penicillins Swelling  . Pseudoephedrine Swelling    Tongue swelling  . Sulfa Antibiotics Nausea And Vomiting  . Tramadol Hcl Itching    REACTION: seizures    Medications Prior to Admission  Medication Sig Dispense Refill  . albuterol (PROVENTIL,VENTOLIN) 90 MCG/ACT inhaler Inhale 2 puffs into the lungs every 4 (four) hours as needed. For wheezing      . Azelastine HCl (ASTEPRO) 0.15 % SOLN Place 2 sprays into the nose daily.       Marland Kitchen azithromycin (ZITHROMAX) 250 MG tablet Take 250 mg by mouth daily. Took for 5 days-last dose 04/20/11       . beclomethasone (QVAR) 80 MCG/ACT inhaler Inhale 1 puff into the lungs as needed.  1 Inhaler  12  . budesonide-formoterol (SYMBICORT) 160-4.5 MCG/ACT inhaler Inhale 2 puffs into the lungs 2 (two) times daily.        . calcium-vitamin D (OSCAL WITH D) 250-125 MG-UNIT per tablet Take 1 tablet by mouth daily.        . carbamazepine (TEGRETOL XR) 400 MG 12 hr tablet Take 400 mg by mouth 2 (two) times daily.        . cetirizine (ZYRTEC) 10 MG tablet Take 10 mg by mouth every morning.       . cholestyramine light (PREVALITE) 4 G packet Take 4 g by mouth 2 (two) times daily.         . divalproex (DEPAKOTE) 250 MG EC tablet Take 250 mg by mouth 3 (three) times daily. Take 1 tablet in the morning, 1 tablet at noon, and 2 tablets at bedtime      . ferrous sulfate 325 (65 FE) MG tablet Take 1 tablet (325 mg total) by mouth daily with breakfast.  30 tablet  3  . fluticasone (FLONASE) 50 MCG/ACT nasal spray Place 2 sprays into the nose daily.  16 g  10  . montelukast (SINGULAIR) 10 MG tablet Take 1 tablet (10 mg total) by mouth daily.  30 tablet  12  . norethindrone-ethinyl estradiol (OVCON-35) 0.4-35 MG-MCG per tablet Take 1 tablet by mouth daily.       Marland Kitchen omeprazole (PRILOSEC) 40 MG capsule Take 40 mg by mouth 2 (two) times daily.       Bertram Gala Glycol-Propyl Glycol (SYSTANE) 0.4-0.3 % SOLN Place 2 drops into both eyes 4 (four) times daily as needed. For dry eyes       . selenium sulfide (SELSUN) 2.5 % shampoo Apply topically daily. as directed      . sodium chloride (BRONCHO SALINE) inhaler solution Take  1 spray by nebulization 2 (two) times daily.        . chlorpheniramine-HYDROcodone (TUSSIONEX PENNKINETIC ER) 10-8 MG/5ML LQCR Take 5 mLs by mouth every 12 (twelve) hours as needed (cough).  140 mL  0  . OVER THE COUNTER MEDICATION Place 1 drop into both eyes at bedtime. Gentle tear eye gel       . polyethylene glycol (GLYCOLAX) packet Take 17 g by mouth daily as needed. Mix into 8 ounces of fluid-for constipation        Medications Prior to Admission  Medication Dose Route Frequency Provider Last Rate Last Dose  . 0.9 %  sodium chloride infusion   Intravenous Continuous Freddy Jaksch, MD 20 mL/hr at 04/24/11 [phone]    . albuterol (PROVENTIL) (5 MG/ML) 0.5% nebulizer solution 2.5 mg  2.5 mg Nebulization Once Olivia Mackie, MD   2.5 mg at 04/23/11 0244  . albuterol (PROVENTIL) (5 MG/ML) 0.5% nebulizer solution 2.5 mg  2.5 mg Nebulization Once Olivia Mackie, MD   2.5 mg at 04/23/11 0244  . chlorpheniramine-HYDROcodone (TUSSIONEX) 10-8 MG/5ML suspension 5 mL  5 mL Oral Once  Olivia Mackie, MD   5 mL at 04/23/11 0425  . fentaNYL (SUBLIMAZE) 0.05 MG/ML injection           . ipratropium (ATROVENT) nebulizer solution 0.5 mg  0.5 mg Nebulization Once Olivia Mackie, MD   0.5 mg at 04/23/11 0244  . midazolam (VERSED) 2 MG/2ML injection             Results for orders placed during the hospital encounter of 04/24/11 (from the past 48 hour(s))  APTT     Status: Abnormal   Collection Time   04/24/11  9:00 AM      Component Value Range Comment   aPTT 55 (*) 24 - 37 (seconds)   CBC     Status: Abnormal   Collection Time   04/24/11  9:00 AM      Component Value Range Comment   WBC 3.9 (*) 4.0 - 10.5 (K/uL)    RBC 3.63 (*) 3.87 - 5.11 (MIL/uL)    Hemoglobin 10.5 (*) 12.0 - 15.0 (g/dL)    HCT 09.8 (*) 11.9 - 46.0 (%)    MCV 84.6  78.0 - 100.0 (fL)    MCH 28.9  26.0 - 34.0 (pg)    MCHC 34.2  30.0 - 36.0 (g/dL)    RDW 14.7  82.9 - 56.2 (%)    Platelets 151  150 - 400 (K/uL)   PROTIME-INR     Status: Abnormal   Collection Time   04/24/11  9:00 AM      Component Value Range Comment   Prothrombin Time 21.1 (*) 11.6 - 15.2 (seconds)    INR 1.79 (*) 0.00 - 1.49     Dg Chest 2 View  04/23/2011  *RADIOLOGY REPORT*  Clinical Data: Shortness of breath, cough and wheezing; history of asthma.  CHEST - 2 VIEW  Comparison: Chest radiograph performed 12/22/2010  Findings: The lungs are mildly hypoexpanded; mild left basilar airspace opacity may reflect atelectasis or possibly mild pneumonia.  There is no evidence of pleural effusion or pneumothorax.  The heart is normal in size; the mediastinal contour is within normal limits.  No acute osseous abnormalities are seen.  IMPRESSION: Lungs mildly hypoexpanded; mild left basilar opacity may reflect atelectasis or possibly mild pneumonia.  Original Report Authenticated By: Tonia Ghent, M.D.    Review of Systems  Constitutional:  Positive for chills and malaise/fatigue.  Eyes: Negative.   Respiratory: Positive for cough and sputum  production. Negative for wheezing.   Cardiovascular: Positive for leg swelling.  Gastrointestinal: Positive for heartburn.  Skin: Negative.   Neurological: Positive for seizures.  Endo/Heme/Allergies: Negative.     Blood pressure 110/69, pulse 94, temperature 98.4 F (36.9 C), temperature source Oral, resp. rate 16, height 5\' 4"  (1.626 m), weight 147 lb (66.679 kg), last menstrual period 04/16/2011, SpO2 99.00%. Physical Exam  Cardiovascular: Normal heart sounds.  Exam reveals no gallop and no friction rub.   No murmur heard. Respiratory: Effort normal.       Slight decrease in sounds on left lower lung base   Genitourinary: Vaginal discharge found.  Musculoskeletal: Normal range of motion.     Assessment/Plan Elevated liver functions.  Patient for random liver core biopsy.  Details of the procedure have been reviewed with the patient with her apparent understanding.  Risks are that of bleeding, infection, organ damage and complications with moderate sedation.  Patient verbalized her understanding of all above and written consent was obtained to proceed.   Akhilesh Sassone D 04/24/2011, 10:03 AM

## 2011-04-25 ENCOUNTER — Encounter: Payer: Self-pay | Admitting: Family Medicine

## 2011-04-25 NOTE — Telephone Encounter (Signed)
Called [name]. Have addressed it. Headache improved.

## 2011-04-26 DIAGNOSIS — K745 Biliary cirrhosis, unspecified: Secondary | ICD-10-CM

## 2011-04-26 HISTORY — DX: Biliary cirrhosis, unspecified: K74.5

## 2011-04-30 ENCOUNTER — Telehealth: Payer: Self-pay | Admitting: Family Medicine

## 2011-04-30 NOTE — Telephone Encounter (Signed)
Needs to talk to nurse about her biopsy

## 2011-04-30 NOTE — Telephone Encounter (Signed)
Pt states she got a paper in the mail re an appeal for a bill at an Imaging center for October. Advised pt thati did not see any imaging for oct other than an U/S. Advised to call Imaging facility to see if she can figure this out, or ask her Uncle. Pt agreed.

## 2011-05-03 ENCOUNTER — Ambulatory Visit: Payer: Medicaid Other | Admitting: Family Medicine

## 2011-05-07 ENCOUNTER — Telehealth: Payer: Self-pay | Admitting: *Deleted

## 2011-05-07 ENCOUNTER — Encounter: Payer: Self-pay | Admitting: Family Medicine

## 2011-05-07 ENCOUNTER — Ambulatory Visit (INDEPENDENT_AMBULATORY_CARE_PROVIDER_SITE_OTHER): Payer: Medicaid Other | Admitting: Family Medicine

## 2011-05-07 VITALS — BP 130/80 | HR 90 | Ht 59.5 in | Wt 143.8 lb

## 2011-05-07 DIAGNOSIS — K743 Primary biliary cirrhosis: Secondary | ICD-10-CM

## 2011-05-07 DIAGNOSIS — K745 Biliary cirrhosis, unspecified: Secondary | ICD-10-CM

## 2011-05-07 DIAGNOSIS — M25539 Pain in unspecified wrist: Secondary | ICD-10-CM

## 2011-05-07 DIAGNOSIS — M25531 Pain in right wrist: Secondary | ICD-10-CM

## 2011-05-07 HISTORY — DX: Primary biliary cirrhosis: K74.3

## 2011-05-07 NOTE — Patient Instructions (Signed)
Thank you for coming in today. Get an xray of your wrist.  I will write a prescription for a wrist sleeve.  [facility] the best place to go.  [facility] is on [address] [place] [facility] [address].  See me in 1 month.

## 2011-05-07 NOTE — Telephone Encounter (Signed)
Patient called and left a message this am at 8:45 am stating she was calling because she was started on a new medication for her liver and the medication says let my doctor know if I take Birth control pills and I was calling to check with you- please call me before 10 because I am going out or you can call me about 4:30 I'll be back then.

## 2011-05-07 NOTE — Telephone Encounter (Signed)
Called pt and left message that Dr. Denyse Amass is her PCP and he had originally prescribed the birth control pills. She should speak to him regarding her question or concern.  * Note: pt has appt today @ 1430 with Dr. Denyse Amass @ [facility].

## 2011-05-07 NOTE — Progress Notes (Signed)
2 weeks ago [name] tripped over a curb and fell onto her outstretched hands bilaterally.  She notes continued pain on the dorsal aspect of her right wrist. She denies any pain in her anatomical snuff box or with hand range of motion. She notes that she has normal hand strength coordination sensation and range of motion. She does note continued pain but otherwise feels okay.  Also of note today she brings a note from her gastroenterologist within the results of her recent biopsy. 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  PMH reviewed.  ROS as above otherwise neg Medications reviewed. Current Outpatient Prescriptions  Medication Sig Dispense Refill  . ursodiol (ACTIGALL) 250 MG tablet Take 500 mg by mouth 2 (two) times daily.        Marland Kitchen albuterol (PROVENTIL,VENTOLIN) 90 MCG/ACT inhaler Inhale 2 puffs into the lungs every 4 (four) hours as needed. For wheezing      . Azelastine HCl (ASTEPRO) 0.15 % SOLN Place 2 sprays into the nose daily.       Marland Kitchen azithromycin (ZITHROMAX) 250 MG tablet Take 250 mg by mouth daily. Took for 5 days-last dose 04/20/11       . beclomethasone (QVAR) 80 MCG/ACT inhaler Inhale 1 puff into the lungs as needed.  1 Inhaler  12  . budesonide-formoterol (SYMBICORT) 160-4.5 MCG/ACT inhaler Inhale 2 puffs into the lungs 2 (two) times daily.        . calcium-vitamin D (OSCAL WITH D) 250-125 MG-UNIT per tablet Take 1 tablet by mouth daily.        . carbamazepine (TEGRETOL XR) 400 MG 12 hr tablet Take 400 mg by mouth 2 (two) times daily.        . cetirizine (ZYRTEC) 10 MG tablet Take 10 mg by mouth every morning.       . chlorpheniramine-HYDROcodone (TUSSIONEX PENNKINETIC ER) 10-8 MG/5ML LQCR Take 5 mLs by mouth every 12 (twelve) hours as needed (cough).  140 mL  0  . cholestyramine light (PREVALITE) 4 G packet Take 4 g by mouth 2 (two) times daily.        . divalproex (DEPAKOTE) 250 MG EC tablet Take 250 mg by mouth 3 (three) times daily. Take 1 tablet in the  morning, 1 tablet at noon, and 2 tablets at bedtime      . ferrous sulfate 325 (65 FE) MG tablet Take 1 tablet (325 mg total) by mouth daily with breakfast.  30 tablet  3  . fluticasone (FLONASE) 50 MCG/ACT nasal spray Place 2 sprays into the nose daily.  16 g  10  . montelukast (SINGULAIR) 10 MG tablet Take 1 tablet (10 mg total) by mouth daily.  30 tablet  12  . norethindrone-ethinyl estradiol (OVCON-35) 0.4-35 MG-MCG per tablet Take 1 tablet by mouth daily.       Marland Kitchen omeprazole (PRILOSEC) 40 MG capsule Take 40 mg by mouth 2 (two) times daily.       Marland Kitchen OVER THE COUNTER MEDICATION Place 1 drop into both eyes at bedtime. Gentle tear eye gel       . Polyethyl Glycol-Propyl Glycol (SYSTANE) 0.4-0.3 % SOLN Place 2 drops into both eyes 4 (four) times daily as needed. For dry eyes       . polyethylene glycol (GLYCOLAX) packet Take 17 g by mouth daily as needed. Mix into 8 ounces of fluid-for constipation       . selenium sulfide (SELSUN) 2.5 % shampoo Apply topically daily. as directed      .  sodium chloride (BRONCHO SALINE) inhaler solution Take 1 spray by nebulization 2 (two) times daily.         Exam:  BP 130/80  Pulse 90  Ht 4' 11.5" (1.511 m)  Wt 143 lb 12.8 oz (65.227 kg)  BMI 28.56 kg/m2  LMP 04/16/2011 Gen: Well NAD MSK: Normal hand and wrist range of motion. Hand is normal appearing without significant swelling. Nontender and anatomical snuff box. Mild tenderness to palpation over the middle dorsal carpal bones. Normal sensation and capillary refill in fingers.

## 2011-05-07 NOTE — Assessment & Plan Note (Signed)
Managed by Dr. Dulce Sellar gastroenterology.

## 2011-05-07 NOTE — Assessment & Plan Note (Signed)
Do to fall on April 25, 2011. Likely wrist sprain. Will obtain wrist x-ray today to confirm no fracture. We'll also write for compressive sleeve for alleviation of some pain. Recommend low-dose Tylenol as needed for pain. Will followup in one month.

## 2011-05-17 ENCOUNTER — Telehealth: Payer: Self-pay | Admitting: Family Medicine

## 2011-05-17 NOTE — Telephone Encounter (Signed)
Asking to speak with RN about a cough she has, wants to know what she should take over the counter?

## 2011-05-17 NOTE — Telephone Encounter (Signed)
Spoke with patient told her she can take anything that does not have tylenol in it per Dr Denyse Amass.[name], [name]

## 2011-06-06 ENCOUNTER — Encounter: Payer: Self-pay | Admitting: Obstetrics & Gynecology

## 2011-06-06 ENCOUNTER — Ambulatory Visit (INDEPENDENT_AMBULATORY_CARE_PROVIDER_SITE_OTHER): Payer: Medicaid Other | Admitting: Obstetrics & Gynecology

## 2011-06-06 ENCOUNTER — Other Ambulatory Visit (HOSPITAL_COMMUNITY)
Admission: RE | Admit: 2011-06-06 | Discharge: 2011-06-06 | Disposition: A | Discharge status: Home / Self Care | Payer: Medicaid Other | Source: Ambulatory Visit | Attending: Obstetrics & Gynecology | Admitting: Obstetrics & Gynecology

## 2011-06-06 DIAGNOSIS — N938 Other specified abnormal uterine and vaginal bleeding: Secondary | ICD-10-CM | POA: Insufficient documentation

## 2011-06-06 DIAGNOSIS — Z01812 Encounter for preprocedural laboratory examination: Secondary | ICD-10-CM

## 2011-06-06 DIAGNOSIS — N949 Unspecified condition associated with female genital organs and menstrual cycle: Secondary | ICD-10-CM | POA: Insufficient documentation

## 2011-06-06 LAB — POCT PREGNANCY, URINE: Preg Test, Ur: NEGATIVE

## 2011-06-06 NOTE — Progress Notes (Signed)
  Subjective:    Patient ID: [name], female    DOB: [birth year], 36 y.o.   MRN: [id number]  HPI  Ms. Peppard is a 36 yo S AA G0 who comes here for an EMBX due to DUB. She was given a prescription for OCPs in the spring of 2012 but because of her elevated LFTs, I think that OCPs should be discontinued. She tells me tried depo provera in the past but that it made her vaginal bleeding worse.  Review of Systems     Objective:   Physical Exam  I did a EMBX after prepping her cervix with betadine, but very little tissue was obtained (virginal, tight cervix- no pretreatment with cytotec). Please note that an ultrasound done in 3/12 was normal with a thin lining.      Assessment & Plan:  DUB- I have offered her a Mirena (only with cytotec pretreatment) or a HTA ablation w/wo BTL. She will read the information I have given her and will RTC in 1 month to tell me her thoughts.

## 2011-06-07 ENCOUNTER — Ambulatory Visit: Payer: Medicaid Other | Admitting: Family Medicine

## 2011-06-25 ENCOUNTER — Other Ambulatory Visit: Payer: Self-pay | Admitting: Family Medicine

## 2011-06-25 NOTE — Telephone Encounter (Signed)
Refill request

## 2011-06-27 ENCOUNTER — Telehealth: Payer: Self-pay | Admitting: *Deleted

## 2011-06-27 NOTE — Telephone Encounter (Signed)
Received call from April @ [facility].  Patient has appt this morning for routine eye exam with dr. Maris Berger and they need the NPI #.  Info given.  Gaylene Brooks, RN

## 2011-07-04 ENCOUNTER — Telehealth: Payer: Self-pay | Admitting: Family Medicine

## 2011-07-04 NOTE — Telephone Encounter (Signed)
Pt is coughing up yellow phlegm and wants to talk to nurse about what to do - she has appt on Tuesday, and she has to call transportation 3 days in advance.

## 2011-07-04 NOTE — Telephone Encounter (Addendum)
Spoke with patient and she reports. yellow , green, clear  with blood streaked sputum. States breathing is  fast and hard  And she has some wheezing. Generally feels bad.  While talking to patient on phone  she does not sound in distress or SOB . Also states she has had numbness in her right arm for a while. Worse for past 2 days  Offered  appointment here .  She cannot come due to  transportation  issue. Has to let transportation know 3 days ahead. Advised that due to symptoms she is voicing, sounds like she really needs to be seen and suggested she try to get someone to take her to ED or Urgent Care.Marland Kitchen  She will see what she can do. Has an appointment scheduled on 02/12 for CPE

## 2011-07-06 ENCOUNTER — Ambulatory Visit: Payer: Medicaid Other | Admitting: Obstetrics & Gynecology

## 2011-07-09 ENCOUNTER — Telehealth: Payer: Self-pay | Admitting: Family Medicine

## 2011-07-09 ENCOUNTER — Ambulatory Visit (INDEPENDENT_AMBULATORY_CARE_PROVIDER_SITE_OTHER): Payer: Medicaid Other | Admitting: Family Medicine

## 2011-07-09 ENCOUNTER — Encounter: Payer: Self-pay | Admitting: Family Medicine

## 2011-07-09 VITALS — BP 111/77 | HR 90 | Temp 97.5°F | Ht 59.5 in | Wt 149.5 lb

## 2011-07-09 DIAGNOSIS — J069 Acute upper respiratory infection, unspecified: Secondary | ICD-10-CM | POA: Insufficient documentation

## 2011-07-09 MED ORDER — BENZONATATE 100 MG PO CAPS: 100.0000 mg | ORAL_CAPSULE | Freq: Two times a day (BID) | ORAL | Status: AC | PRN

## 2011-07-09 MED ORDER — GUAIFENESIN 100 MG/5ML PO LIQD: 200.0000 mg | Freq: Three times a day (TID) | ORAL | Status: AC | PRN

## 2011-07-09 NOTE — Telephone Encounter (Signed)
Patient is calling to speak to Dr. Denyse Amass is coming to for her Physical and wants to have the blood work done then.

## 2011-07-09 NOTE — Patient Instructions (Signed)
It was great to see you today!  Schedule an appointment to see Dr. Denyse Amass as needed.  You have a viral infection. This should go away on its own.  Drink lots of fluid and get rest. I have prescribed two medications for your cough.

## 2011-07-09 NOTE — Telephone Encounter (Signed)
Will discuss labs at patient's office visit tomorrow.Busick, [name]

## 2011-07-09 NOTE — Telephone Encounter (Signed)
Please call the patient back. She should be see here or the ED today.

## 2011-07-09 NOTE — Progress Notes (Signed)
  Subjective:     [name] is a 35 y.o. female who presents for evaluation of symptoms of a URI. Symptoms include congestion and cough described as nonproductive and paroxysmal. Onset of symptoms was 1 week ago, and has been gradually improving since that time. Treatment to date: none. No sick contacts.   Review of Systems Pertinent items are noted in HPI. No fever, chills, night sweats, weight loss.  Objective:  Lungs:  Normal respiratory effort, chest expands symmetrically. Lungs are clear to auscultation, no crackles or wheezes. Heart - Regular rate and rhythm.  No murmurs, gallops or rubs.    Extremities:  No cyanosis, edema. Strength in her right arm and left arm equal and 5/5. Skin:  Intact without suspicious lesions or rashes Mouth - no lesions, mucous membranes are moist, no decaying teeth  Ears:  External ear exam shows no significant lesions or deformities.  Otoscopic examination reveals clear canals, tympanic membranes are intact bilaterally without bulging, retraction, inflammation or discharge. Hearing is grossly normal bilaterall Neck:  No deformities, thyromegaly, masses, or tenderness noted.   Supple with full range of motion without pain.  Assessment:

## 2011-07-09 NOTE — Telephone Encounter (Signed)
Spoke with patient and gave her message from Dr. Denyse Amass. Appointment scheduled for 10:30 today but patient will need to call her transportation to see is they will bring her.

## 2011-07-09 NOTE — Assessment & Plan Note (Signed)
Tessalon Perles and Robitussin for cough. This viral illness is without fever or signs of superinfection.

## 2011-07-10 ENCOUNTER — Encounter: Payer: Self-pay | Admitting: Family Medicine

## 2011-07-10 ENCOUNTER — Ambulatory Visit (INDEPENDENT_AMBULATORY_CARE_PROVIDER_SITE_OTHER): Payer: Medicaid Other | Admitting: Family Medicine

## 2011-07-10 VITALS — BP 102/78 | HR 72 | Temp 98.7°F | Ht 60.0 in | Wt 150.0 lb

## 2011-07-10 DIAGNOSIS — K745 Biliary cirrhosis, unspecified: Secondary | ICD-10-CM

## 2011-07-10 DIAGNOSIS — Z0000 Encounter for general adult medical examination without abnormal findings: Secondary | ICD-10-CM

## 2011-07-10 DIAGNOSIS — K743 Primary biliary cirrhosis: Secondary | ICD-10-CM

## 2011-07-10 LAB — LDL CHOLESTEROL, DIRECT: Direct LDL: 64 mg/dL

## 2011-07-10 LAB — PROTIME-INR
INR: 1.54 — ABNORMAL HIGH (ref <1.50)
Prothrombin Time: 19.1 seconds — ABNORMAL HIGH (ref 11.6–15.2)

## 2011-07-10 LAB — COMPLETE METABOLIC PANEL WITH GFR
ALT: 40 U/L — ABNORMAL HIGH (ref 0–35)
AST: 62 U/L — ABNORMAL HIGH (ref 0–37)
Alkaline Phosphatase: 289 U/L — ABNORMAL HIGH (ref 39–117)
BUN: 12 mg/dL (ref 6–23)
Creat: 0.42 mg/dL — ABNORMAL LOW (ref 0.50–1.10)
Total Bilirubin: 2.1 mg/dL — ABNORMAL HIGH (ref 0.3–1.2)

## 2011-07-10 NOTE — Patient Instructions (Signed)
Thank you for coming in today. Come back in 2 months.  You are going well today.  We are getting a CMP, INR, and direct LDL today.  Please let Dr. Dulce Sellar know what labs we are doing today.

## 2011-07-10 NOTE — Progress Notes (Signed)
[name] is a 36 y.o. female who presents to [facility] today for wellness visit [name] was seen yesterday at the family [facility] for a URI. She currently is feeling well with no dyspnea fevers chills nausea vomiting. Additionally she is being managed for seizure disorder, primary biliary cirrhosis, significant asthma and dysfunctional uterine bleeding. On June 06, 2011 she had an endometrial biopsy which was normal. She has been offered Mirena versus endometrial ablation.  Additionally her hepatologist is managing vitamin D deficiency.  She had her last Pap smear in May of 2012.  She has no complaints currently. She is up-to-date on vaccinations and her history section and health maintenance section were updated.   PMH reviewed. As noted above nonsmoker nondrinker ROS as above otherwise neg Medications reviewed. Current Outpatient Prescriptions  Medication Sig Dispense Refill  . albuterol (PROVENTIL,VENTOLIN) 90 MCG/ACT inhaler Inhale 2 puffs into the lungs every 4 (four) hours as needed. For wheezing      . Azelastine HCl (ASTEPRO) 0.15 % SOLN Place 2 sprays into the nose daily.       . beclomethasone (QVAR) 80 MCG/ACT inhaler Inhale 1 puff into the lungs as needed.  1 Inhaler  12  . benzonatate (TESSALON) 100 MG capsule Take 1 capsule (100 mg total) by mouth 2 (two) times daily as needed for cough.  20 capsule  0  . budesonide-formoterol (SYMBICORT) 160-4.5 MCG/ACT inhaler Inhale 2 puffs into the lungs 2 (two) times daily.        . carbamazepine (TEGRETOL XR) 400 MG 12 hr tablet Take 400 mg by mouth 2 (two) times daily.        . cetirizine (ZYRTEC) 10 MG tablet Take 10 mg by mouth every morning.       . Cholecalciferol (D3 DOTS) 2000 UNITS TBDP Take 1 tablet by mouth daily.      . Cholecalciferol (VITAMIN D3) 50000 UNITS CAPS Take 1 tablet by mouth every 7 (seven) days.      . cholestyramine light (PREVALITE) 4 G packet Take 4 g by mouth 2 (two) times daily.        . divalproex  (DEPAKOTE) 250 MG EC tablet Take 250 mg by mouth 3 (three) times daily. Take 1 tablet in the morning, 1 tablet at noon, and 2 tablets at bedtime      . ferrous sulfate 325 (65 FE) MG tablet take 1 tablet by mouth once daily with BREAKFAST.  30 tablet  3  . fluticasone (FLONASE) 50 MCG/ACT nasal spray Place 2 sprays into the nose daily.  16 g  10  . guaiFENesin (ROBITUSSIN) 100 MG/5ML liquid Take 10 mLs (200 mg total) by mouth 3 (three) times daily as needed for cough.  120 mL  0  . montelukast (SINGULAIR) 10 MG tablet Take 1 tablet (10 mg total) by mouth daily.  30 tablet  12  . Multiple Vitamin (MULTIVITAMIN) tablet Take 1 tablet by mouth daily.      Marland Kitchen omeprazole (PRILOSEC) 40 MG capsule Take 40 mg by mouth 2 (two) times daily.       Bertram Gala Glycol-Propyl Glycol (SYSTANE) 0.4-0.3 % SOLN Place 2 drops into both eyes 4 (four) times daily as needed. For dry eyes       . polyethylene glycol (GLYCOLAX) packet Take 17 g by mouth daily as needed. Mix into 8 ounces of fluid-for constipation       . selenium sulfide (SELSUN) 2.5 % shampoo Apply topically daily. as directed      .  sodium chloride (BRONCHO SALINE) inhaler solution Take 1 spray by nebulization 2 (two) times daily.        . ursodiol (ACTIGALL) 250 MG tablet Take 500 mg by mouth 2 (two) times daily.          Exam:  BP 102/78  Pulse 72  Temp(Src) 98.7 F (37.1 C) (Oral)  Ht 5' (1.524 m)  Wt 150 lb (68.04 kg)  BMI 29.30 kg/m2  LMP 06/29/2011 Gen: Well NAD HEENT: EOMI,  MMM Lungs: CTABL Nl WOB Heart: RRR no MRG Abd: NABS, NT, ND, no hepatomegaly Exts: Non edematous BL  LE, warm and well perfused.

## 2011-07-10 NOTE — Assessment & Plan Note (Signed)
Despite multiple medical issues [name] is doing well.  Plan to obtain labs to analyze hepatic function including progressive metabolic panel and INR. Additionally we'll assess lipids with LDL.   Additionally patient needs a letter or form sent to [facility] office authorizing more visit. I will consult our social worker for assistance in this matter. Followup in one or 2 months.

## 2011-07-11 ENCOUNTER — Encounter: Payer: Self-pay | Admitting: Family Medicine

## 2011-07-23 ENCOUNTER — Telehealth: Payer: Self-pay | Admitting: Family Medicine

## 2011-07-23 ENCOUNTER — Telehealth: Payer: Self-pay | Admitting: Clinical

## 2011-07-23 NOTE — Telephone Encounter (Signed)
Clinical Child psychotherapist (CSW) spoke with pt regarding the form she was requesting her PCP to sign. Pt stated that she has a 22 limit visitation a year and therefore will need her PCP to complete a "general request for prior approval form" to be signed and submitted back to [address] [address] [place] [address]. CSW will provide this form to pt PCP and ask to have it mailed to the requested address. CSW is available if additional assistance is needed. Theresia Bough, MSW, Theresia Majors [phone]

## 2011-07-23 NOTE — Telephone Encounter (Signed)
Clinical Child psychotherapist (CSW) received referral to assist with clarifying pt needs. CSW left a message for pt as she was unable to reach pt.  Theresia Bough, MSW, Theresia Majors [phone]

## 2011-07-23 NOTE — Telephone Encounter (Signed)
Forward to Dr Corey 

## 2011-07-23 NOTE — Telephone Encounter (Signed)
Wants to know results of her labs - leave message

## 2011-07-25 NOTE — Telephone Encounter (Signed)
Will route to Dennison Nancy to see if she has that form.

## 2011-07-25 NOTE — Telephone Encounter (Signed)
Called and spoke to Ms Glogowski. Discussed results. Pt will return to clinic

## 2011-07-30 ENCOUNTER — Telehealth: Payer: Self-pay | Admitting: Family Medicine

## 2011-07-30 NOTE — Telephone Encounter (Signed)
Pt is still feeling bad and nose is still bleeding and needs to talk to nurse - has appt on Wed.

## 2011-07-30 NOTE — Telephone Encounter (Signed)
[name]  Has been having congestion since last Thursday.  Has been using humidifier and saline nasal spray.  Has had tinges of blood with green/brown productive cough and tinges of blood when she blows nose.  Denies fever.  Unable to come for office visit until Wednesday.  Patient informed to continue to use humidifier and nasal spray to help decrease dry sinuses.  Can also increase po fluids.  Patient verbalized understanding.  Gaylene Brooks, RN

## 2011-08-01 ENCOUNTER — Ambulatory Visit (INDEPENDENT_AMBULATORY_CARE_PROVIDER_SITE_OTHER): Payer: Medicaid Other | Admitting: Family Medicine

## 2011-08-01 ENCOUNTER — Other Ambulatory Visit: Payer: Self-pay

## 2011-08-01 ENCOUNTER — Ambulatory Visit (HOSPITAL_COMMUNITY)
Admission: RE | Admit: 2011-08-01 | Discharge: 2011-08-01 | Disposition: A | Discharge status: Home / Self Care | Payer: Medicaid Other | Source: Ambulatory Visit | Attending: Family Medicine | Admitting: Family Medicine

## 2011-08-01 ENCOUNTER — Encounter: Payer: Self-pay | Admitting: Family Medicine

## 2011-08-01 DIAGNOSIS — R0789 Other chest pain: Secondary | ICD-10-CM

## 2011-08-01 DIAGNOSIS — R079 Chest pain, unspecified: Secondary | ICD-10-CM | POA: Insufficient documentation

## 2011-08-01 LAB — COMPLETE METABOLIC PANEL WITH GFR
ALT: 46 U/L — ABNORMAL HIGH (ref 0–35)
CO2: 27 mEq/L (ref 19–32)
GFR, Est African American: >89 mL/min
Potassium: 4.6 mEq/L (ref 3.5–5.3)
Sodium: 137 mEq/L (ref 135–145)
Total Bilirubin: 2.4 mg/dL — ABNORMAL HIGH (ref 0.3–1.2)
Total Protein: 8.3 g/dL (ref 6.0–8.3)

## 2011-08-01 NOTE — Patient Instructions (Signed)
Thank you for coming in today. Please come back in 2 weeks.  Call 911 if you have bad chest pain that does not get better.  Continue your breathing medications.  Follow up with your regular doctor.  We will get you into a cardiologist's office soon. Keep your phone handy.

## 2011-08-01 NOTE — Progress Notes (Signed)
[name] is a 36 y.o. female who presents to [facility] today for   1) Chest Pain: Right-sided starting on 21 February typically a course in the morning and is associated with some abdominal discomfort. She recently was started on Keppra by her neurologist and Tegretol was weaned off. She denies any fevers chills trouble breathing vomiting or diarrhea.  Additionally she denies any palpitations or syncope.     PMH reviewed. Significant for primary biliary sclerosis, and asthma ROS as above otherwise neg Medications reviewed. Current Outpatient Prescriptions  Medication Sig Dispense Refill  . albuterol (PROVENTIL,VENTOLIN) 90 MCG/ACT inhaler Inhale 2 puffs into the lungs every 4 (four) hours as needed. For wheezing      . Azelastine HCl (ASTEPRO) 0.15 % SOLN Place 2 sprays into the nose daily.       . beclomethasone (QVAR) 80 MCG/ACT inhaler Inhale 1 puff into the lungs as needed.  1 Inhaler  12  . budesonide-formoterol (SYMBICORT) 160-4.5 MCG/ACT inhaler Inhale 2 puffs into the lungs 2 (two) times daily.        . cetirizine (ZYRTEC) 10 MG tablet Take 10 mg by mouth every morning.       . Cholecalciferol (D3 DOTS) 2000 UNITS TBDP Take 1 tablet by mouth daily.      . Cholecalciferol (VITAMIN D3) 50000 UNITS CAPS Take 1 tablet by mouth every 7 (seven) days.      . cholestyramine light (PREVALITE) 4 G packet Take 4 g by mouth 2 (two) times daily.        . divalproex (DEPAKOTE) 250 MG EC tablet Take 250 mg by mouth 3 (three) times daily. Take 1 tablet in the morning, 1 tablet at noon, and 2 tablets at bedtime      . ferrous sulfate 325 (65 FE) MG tablet take 1 tablet by mouth once daily with BREAKFAST.  30 tablet  3  . fluticasone (FLONASE) 50 MCG/ACT nasal spray Place 2 sprays into the nose daily.  16 g  10  . levETIRAcetam (KEPPRA) 500 MG tablet Take 500 mg by mouth every 12 (twelve) hours.      . montelukast (SINGULAIR) 10 MG tablet Take 1 tablet (10 mg total) by mouth daily.  30 tablet  12  .  Multiple Vitamin (MULTIVITAMIN) tablet Take 1 tablet by mouth daily.      Marland Kitchen omeprazole (PRILOSEC) 40 MG capsule Take 40 mg by mouth 2 (two) times daily.       Bertram Gala Glycol-Propyl Glycol (SYSTANE) 0.4-0.3 % SOLN Place 2 drops into both eyes 4 (four) times daily as needed. For dry eyes       . polyethylene glycol (GLYCOLAX) packet Take 17 g by mouth daily as needed. Mix into 8 ounces of fluid-for constipation       . selenium sulfide (SELSUN) 2.5 % shampoo Apply topically daily. as directed      . sodium chloride (BRONCHO SALINE) inhaler solution Take 1 spray by nebulization 2 (two) times daily.        . ursodiol (ACTIGALL) 250 MG tablet Take 500 mg by mouth 2 (two) times daily.          Exam:  BP 122/79  Pulse 83  Ht 5' (1.524 m)  Wt 145 lb (65.772 kg)  BMI 28.32 kg/m2  SpO2 100%  LMP 06/29/2011 Gen: Well NAD HEENT: EOMI,  MMM Lungs: CTABL Nl WOB Heart: Soft systolic murmur best heard at the upper sternal border with no radiation no MRG Abd:  NABS, NT, ND Exts: Non edematous BL  LE, warm and well perfused.   EKG shows normal sinus rhythm at 80 beats per minute with no ST segment elevations or depressions no T wave inversions

## 2011-08-02 ENCOUNTER — Encounter: Payer: Self-pay | Admitting: Family Medicine

## 2011-08-02 ENCOUNTER — Telehealth: Payer: Self-pay | Admitting: Family Medicine

## 2011-08-02 DIAGNOSIS — K759 Inflammatory liver disease, unspecified: Secondary | ICD-10-CM

## 2011-08-02 LAB — CBC WITH DIFFERENTIAL/PLATELET
Eosinophils Absolute: 0.7 10*3/uL (ref 0.0–0.7)
Lymphocytes Relative: 38 % (ref 12–46)
Lymphs Abs: 1.8 10*3/uL (ref 0.7–4.0)
MCH: 28.8 pg (ref 26.0–34.0)
Neutro Abs: 1.4 10*3/uL — ABNORMAL LOW (ref 1.7–7.7)
Neutrophils Relative %: 30 % — ABNORMAL LOW (ref 43–77)
Platelets: 179 10*3/uL (ref 150–400)
RBC: 3.99 MIL/uL (ref 3.87–5.11)
WBC: 4.7 10*3/uL (ref 4.0–10.5)

## 2011-08-02 NOTE — Telephone Encounter (Signed)
Called [name] back about her labs. She is feel a little bit better.  She needs me to call in a referral to a  Nutritionist [phone], as this is a referral due to Dr. Hulen Shouts recommendation.

## 2011-08-02 NOTE — Assessment & Plan Note (Addendum)
Chest pain is atypical in nature.  However she does have multiple medical problems including significant liver disease.  I suspect that her pain is likely from her liver and not from any intrathoracic pathology.  Her EKG is normal.  Plan to obtain a comprehensive metabolic panel and a CBC and followup in 2 weeks.  Additionally provided warning signs for worsening chest pain.  If her pain is still present will refer to cardiology for further evaluation.

## 2011-08-06 ENCOUNTER — Telehealth: Payer: Self-pay | Admitting: Family Medicine

## 2011-08-07 ENCOUNTER — Telehealth: Payer: Self-pay | Admitting: *Deleted

## 2011-08-07 NOTE — Telephone Encounter (Signed)
Medicaid office ([place]) called in re: Form for the pt that was not filled out correctly by Dr.Corey. I tried to assist her, but [place] declined and said, that a new form needs to be filled out and every spot needs to be filled out. She will send it by mail to Dr.Corey. [name], [name]

## 2011-08-07 NOTE — Telephone Encounter (Signed)
Opened in error

## 2011-08-09 ENCOUNTER — Encounter: Payer: Self-pay | Admitting: Family Medicine

## 2011-08-09 ENCOUNTER — Ambulatory Visit (INDEPENDENT_AMBULATORY_CARE_PROVIDER_SITE_OTHER): Payer: Medicaid Other | Admitting: Family Medicine

## 2011-08-09 VITALS — BP 119/81 | HR 82 | Temp 98.7°F | Ht 60.0 in | Wt 146.2 lb

## 2011-08-09 DIAGNOSIS — R252 Cramp and spasm: Secondary | ICD-10-CM

## 2011-08-09 DIAGNOSIS — J45909 Unspecified asthma, uncomplicated: Secondary | ICD-10-CM

## 2011-08-09 MED ORDER — PREDNISONE 50 MG PO TABS: 50.0000 mg | ORAL_TABLET | Freq: Every day | ORAL | Status: AC

## 2011-08-09 NOTE — Patient Instructions (Signed)
Thank you for coming in today. If you breathing is bad call and start taking the prednisone pills.  I will call you with your hand cramping lab results.  See Dr. Dulce Sellar soon.  See me in 1 month.  Let me know if you get worse.

## 2011-08-10 ENCOUNTER — Encounter: Payer: Self-pay | Admitting: Family Medicine

## 2011-08-10 LAB — BASIC METABOLIC PANEL
CO2: 25 mEq/L (ref 19–32)
Calcium: 8.4 mg/dL (ref 8.4–10.5)
Creat: 0.53 mg/dL (ref 0.50–1.10)
Sodium: 135 mEq/L (ref 135–145)

## 2011-08-10 NOTE — Assessment & Plan Note (Signed)
Currently doing well however has had problems in the past.  Plan to prescribe prednisone to have at home in case she starts developing an asthma exacerbation again. She is to fill the prescription but hold onto it. If she feels short of breath she should call and discuss with me and I will advise starting the prescription or not.  She expresses understanding.

## 2011-08-10 NOTE — Assessment & Plan Note (Signed)
I am not sure what caused this hand cramping. Plan to assess basic metabolic panel for hypokalemia.  Additionally this may be an effect of her anticonvulsant medications. Will call patient back if abnormalities found. She will call if this continues to happen. Follow up in one month

## 2011-08-10 NOTE — Progress Notes (Signed)
[name] is a 36 y.o. female who presents to [facility] today for   1) Asthma: notes occasional difficulty breathing however she doesn't have any currently. She uses her albuterol along with her other inhalers. She currently feels well with no shortness of breath or wheezing. No fevers or chills.  2) bilateral thumb cramping: 2 days ago patient experienced cramping of her right thumb that lasted several minutes. She describes her thumb a adducting painfuly and required her other hand to extend it. This occurred with the other hand an hour or 2 later.  She has normal hand function sensation and strength currently he feels well otherwise   PMH reviewed. Significant for liver disease and asthma ROS as above otherwise neg Medications reviewed. Current Outpatient Prescriptions  Medication Sig Dispense Refill  . albuterol (PROVENTIL,VENTOLIN) 90 MCG/ACT inhaler Inhale 2 puffs into the lungs every 4 (four) hours as needed. For wheezing      . Azelastine HCl (ASTEPRO) 0.15 % SOLN Place 2 sprays into the nose daily.       . beclomethasone (QVAR) 80 MCG/ACT inhaler Inhale 1 puff into the lungs as needed.  1 Inhaler  12  . budesonide-formoterol (SYMBICORT) 160-4.5 MCG/ACT inhaler Inhale 2 puffs into the lungs 2 (two) times daily.        . cetirizine (ZYRTEC) 10 MG tablet Take 10 mg by mouth every morning.       . Cholecalciferol (D3 DOTS) 2000 UNITS TBDP Take 1 tablet by mouth daily.      . Cholecalciferol (VITAMIN D3) 50000 UNITS CAPS Take 1 tablet by mouth every 7 (seven) days.      . cholestyramine light (PREVALITE) 4 G packet Take 4 g by mouth 2 (two) times daily.        . divalproex (DEPAKOTE) 250 MG EC tablet Take 250 mg by mouth 3 (three) times daily. Take 1 tablet in the morning, 1 tablet at noon, and 2 tablets at bedtime      . ferrous sulfate 325 (65 FE) MG tablet take 1 tablet by mouth once daily with BREAKFAST.  30 tablet  3  . fluticasone (FLONASE) 50 MCG/ACT nasal spray Place 2 sprays into the  nose daily.  16 g  10  . levETIRAcetam (KEPPRA) 500 MG tablet Take 500 mg by mouth every 12 (twelve) hours.      . montelukast (SINGULAIR) 10 MG tablet Take 1 tablet (10 mg total) by mouth daily.  30 tablet  12  . Multiple Vitamin (MULTIVITAMIN) tablet Take 1 tablet by mouth daily.      Marland Kitchen omeprazole (PRILOSEC) 40 MG capsule Take 40 mg by mouth 2 (two) times daily.       Bertram Gala Glycol-Propyl Glycol (SYSTANE) 0.4-0.3 % SOLN Place 2 drops into both eyes 4 (four) times daily as needed. For dry eyes       . polyethylene glycol (GLYCOLAX) packet Take 17 g by mouth daily as needed. Mix into 8 ounces of fluid-for constipation       . predniSONE (DELTASONE) 50 MG tablet Take 1 tablet (50 mg total) by mouth daily.  5 tablet  0  . selenium sulfide (SELSUN) 2.5 % shampoo Apply topically daily. as directed      . sodium chloride (BRONCHO SALINE) inhaler solution Take 1 spray by nebulization 2 (two) times daily.        . ursodiol (ACTIGALL) 250 MG tablet Take 500 mg by mouth 2 (two) times daily.  Exam:  BP 119/81  Pulse 82  Temp 98.7 F (37.1 C)  Ht 5' (1.524 m)  Wt 146 lb 3.2 oz (66.316 kg)  BMI 28.55 kg/m2  SpO2 100%  LMP 06/27/2011 Gen: Well NAD HEENT: EOMI,  MMM Lungs: CTABL Nl WOB Heart: RRR no MRG Abd: NABS, NT, ND Exts: Non edematous BL  LE, warm and well perfused.  MSK: hands are normal appearing bilaterally normal function sensation and strength. No tenderness. Normal range of motion.

## 2011-08-13 ENCOUNTER — Telehealth: Payer: Self-pay | Admitting: Family Medicine

## 2011-08-13 NOTE — Telephone Encounter (Signed)
Patient is calling to let Dr. Denyse Amass know that she is still having trouble breathing and would like for him to call the Rx for Prednisone to her Pharmacy.  She said that he told her to call back if she was still having this trouble.

## 2011-08-13 NOTE — Telephone Encounter (Signed)
Patient calling back.  Was seen last week by Dr. Denyse Amass and Rx'd prednisone.  Pharmacy delivered med to patient on Friday (08/10/11).  Patient states she is still having "a hard time breathing."  Per patient---nurse at daycare listened to lungs and stated "it sounded ok."  Patient states she is having "wheexing from her windpipe."  Patient takes omeprazole 40 mg BID.  Had eaten spicy foods this past weekend and causing some pain.  Wants to know if her reflux could be causing the problems with her breathing.  Calling to get advice from Dr. Denyse Amass to see if she needs to start the prednisone.  Patient informed to not start Prednisone until we call her back.  Note routed to Dr. Denyse Amass and will call patient back tomorrow morning.  Gaylene Brooks, RN

## 2011-08-13 NOTE — Telephone Encounter (Signed)
Will fwd. To Dr.Corey to review. [name], [name]

## 2011-08-14 NOTE — Telephone Encounter (Signed)
Fwd

## 2011-08-14 NOTE — Telephone Encounter (Signed)
Called Ms Furgason today. Had some whistling sounds at daycare and nurse there thought her lungs were ok. Starkisha thinks the lungs are getting better. I recommend against taking the prednisone at this time.   The neurologist called. She will switch off depakote to another kind of medication.

## 2011-08-16 ENCOUNTER — Telehealth: Payer: Self-pay | Admitting: Family Medicine

## 2011-08-16 NOTE — Telephone Encounter (Signed)
Patient is calling for her results. 

## 2011-08-16 NOTE — Telephone Encounter (Signed)
Will fwd. To Dr.Corey to address .Wm [address]agun  

## 2011-08-17 ENCOUNTER — Ambulatory Visit: Payer: Medicaid Other | Admitting: Family Medicine

## 2011-08-20 NOTE — Telephone Encounter (Signed)
Patient is calling back again about her results.  She said it is ok to leave message.  Dr. Dulce Sellar said her Vitamin D was low and prescribed a supplement for her to take for 2 months.

## 2011-08-20 NOTE — Telephone Encounter (Signed)
Called and left a message and asked her to call if any questions.

## 2011-08-22 ENCOUNTER — Telehealth: Payer: Self-pay | Admitting: Family Medicine

## 2011-08-22 NOTE — Telephone Encounter (Signed)
Patient is calling to find out if Dr. Denyse Amass was able to find out if Medicaid will cover for her to see the Nutritionist.  Her appt with the Nutritionist is on 4/4.

## 2011-08-23 NOTE — Telephone Encounter (Signed)
Pt is calling about the papers she gave Dr Denyse Amass the first part of March.  It is to get approval for her to go to nutritionist and has an appt with them next Thursday.  Is not sure if she needs to cancel this appt, she has to call transportation 3 days in advance and Dr Denyse Amass will not be here the first part of next week. pls let patient know.

## 2011-08-23 NOTE — Telephone Encounter (Signed)
Will forward to Dr Denyse Amass.  FYI-small victory!!!

## 2011-08-23 NOTE — Telephone Encounter (Signed)
After speaking with Nutritional Counseling they advised me that [employer] will pay for her Nut Counseling with all of her DX nut deficiency, obesity, hepatitis,etc. They see multiple referrals in system from different MDs So they feel she is covered. Pt is notified to keep appt.

## 2011-08-28 ENCOUNTER — Telehealth: Payer: Self-pay | Admitting: Family Medicine

## 2011-08-28 NOTE — Telephone Encounter (Signed)
Patient was called on March 23rd and informed that her Vit D levels were still low.  Was placed 50,000 units of Vit D taking  1 once a week for eight weeks.  Patient want to ask if she should start taking her prenisone with the tabs or not.  Patient is having exacerbation of asthma symptoms and inhaler seems to cause patient to have more coughing episodes.  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  Need to be advised as to what she can do regarding these symptoms.

## 2011-08-28 NOTE — Telephone Encounter (Signed)
Will forward message to preceptor. 

## 2011-08-28 NOTE — Telephone Encounter (Signed)
Spoke with patient and she reports she has an RX an hand for prednisone that Dr. Denyse Amass gave her to keep . She is suppose to call him before he starts taking.  States when she takes a deep breath in she starts coughing. May be wheezing a little bit she states.  Her jaw  and head hurt. States her jaw is poked out. Has not taken anything for her aches because Dr. Dulce Sellar told her her recent liver tests were still elevated and to use Tylenol  only sparingly.  Paged Dr. Denyse Amass for instructions about prednisone

## 2011-08-28 NOTE — Telephone Encounter (Signed)
Dr. [name] Kettle advises to go ahead and start prednisone and patient will need to be seen while on the course. Appointment scheduled for Thursday . Also advised that she can take tylenol for 3-4 days as needed.  l  The patient  patient is very concerned about her 22 visit  per year over ride form that Dr. Denyse Amass was filling out for her. Dr. Denyse Amass returned page  and he advises that  he did fill out form and sent back. Has not heard from it however. Advised pateint to contact her social worker  and Dr. Denyse Amass will be glad to speak with the social worker . Can give her his cell phone #.   Larita Fife has in her office.

## 2011-08-29 NOTE — Telephone Encounter (Signed)
Patient called to say that she talked with medicaid and they still didn't get the fax that Dr [name] was supposed to send.  I told her that Dr [name] did that last week but they are telling her that they haven't seen anything in their system yet.

## 2011-08-30 ENCOUNTER — Encounter: Payer: Self-pay | Admitting: *Deleted

## 2011-08-30 ENCOUNTER — Ambulatory Visit (INDEPENDENT_AMBULATORY_CARE_PROVIDER_SITE_OTHER): Payer: Medicaid Other | Admitting: Family Medicine

## 2011-08-30 ENCOUNTER — Encounter: Payer: Medicaid Other | Attending: Gastroenterology | Admitting: *Deleted

## 2011-08-30 ENCOUNTER — Encounter: Payer: Self-pay | Admitting: Family Medicine

## 2011-08-30 VITALS — BP 116/71 | HR 85 | Temp 98.1°F | Ht 60.0 in | Wt 144.0 lb

## 2011-08-30 DIAGNOSIS — J45909 Unspecified asthma, uncomplicated: Secondary | ICD-10-CM

## 2011-08-30 DIAGNOSIS — Z713 Dietary counseling and surveillance: Secondary | ICD-10-CM | POA: Insufficient documentation

## 2011-08-30 DIAGNOSIS — J309 Allergic rhinitis, unspecified: Secondary | ICD-10-CM

## 2011-08-30 DIAGNOSIS — R6884 Jaw pain: Secondary | ICD-10-CM

## 2011-08-30 DIAGNOSIS — E559 Vitamin D deficiency, unspecified: Secondary | ICD-10-CM | POA: Insufficient documentation

## 2011-08-30 MED ORDER — GUAIFENESIN ER 600 MG PO TB12: 1200.0000 mg | ORAL_TABLET | Freq: Two times a day (BID) | ORAL | Status: DC

## 2011-08-30 NOTE — Patient Instructions (Signed)
I'm sorry you are not feeling well.  I think you have a viral illness that has caused an asthma flare.  Please keep taking the prednisone, as well as your albuterol every 4 hours, your qvar and symbicort as prescribed.  I have also sent a prescription to your pharmacy for mucinex, which will help loosen the phlegm.  If you feel more short of breath, have fevers or chills, please call the office for an appointment.

## 2011-08-30 NOTE — Progress Notes (Signed)
  Medical Nutrition Therapy:  Appt start time: 1015 end time:  1115.  Assessment:  Primary concerns today: Patient here for information on Vitamin D deficiency. She has complicated medical history, lives alone but her uncle lives next door to her, she takes public transportation to appointments and to Day Care every day. She prepares her own meals and is knowledgeable of her medications and instructions she has been given by providers in the past.  MEDICATIONS: see list   DIETARY INTAKE:  Usual eating pattern includes 3 meals and 2-3 snacks per day.  Everyday foods include good variety of all food groups.  Avoided foods include sweeteners affect her asthma, red jello, and multiple other foods that she avoids for many reasons  24-hr recall:  B ( AM): cereal with milk, OR instant grits OR egg beaters OR spinach salad with strawberries & apples, milk to drink Snk ( AM): vanilla wafers OR other cookies OR canned fruit L ( PM): eats side dishes at Day Care and brings tuna sandwich or other meat, milk to drink Snk ( PM): fresh fruit at home D ( PM): self prepares; fish sticks OR chicken nuggets, starch, vegetable, apple juice to drink, water Snk ( PM): banana OR spinach salad with strawberries and nuts OR Beverages: milk, apple juice, water  Usual physical activity: walking  Progress Towards Goal(s):  In progress.   Nutritional Diagnosis:  NB-1.1 Food and nutrition-related knowledge deficit As related to Vitamin D deficiency.  As evidenced by Vitamin D, 25 hydroxy of less than 4 with normal being 30.0 - 100 ng/mL).    Intervention:  Nutrition counseling provided as well as suggestions for foods high in Vitamin D.  Handouts given during visit include:  Food sources of Vitamin D  Monitoring/Evaluation:  Dietary intake, exercise, increased Vitamin D intake, and body weight prn.

## 2011-08-30 NOTE — Progress Notes (Signed)
  Subjective:    Patient ID: [name], female    DOB: [birth year], 36 y.o.   MRN: [id number]  HPI  Patient comes in for asthma exacerbation.  She says she woke up on Monday with some shortness of breath and wheezing.  She stayed at home that day.  It was worse on Tuesday, so she called the clinic, and was advised to start the prednisone Dr. Denyse Amass had prescribed. She is taking that, as well as spiriva and qvar.  She is taking albuterol as needed.  She has not taken it since last night.    Denies fever/chills.  Is coughing up some yellow phlegm.  +sick contacts, has had family members with colds.  She is having some nasal congestion and cough.  She is taking her allergy medications as prescribed.   She also complains of jaw pain, and feels like her jaw is popping.  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  She has Primary biliary cirrhosis, and is not supposed to take much tylenol, and she takes multiple medications and is not supposed to take NSAIDS.   Review of Systems Pertinent items in HPI.     Objective:   Physical Exam BP 116/71  Pulse 85  Temp(Src) 98.1 F (36.7 C) (Oral)  Ht 5' (1.524 m)  Wt 144 lb (65.318 kg)  BMI 28.12 kg/m2  LMP 07/30/2011 General appearance: alert, cooperative and no distress Head: sinuses nontender to percussion, patient with mild tenderness to palpation on left jaw, no dislocation with opening moouth Ears: normal TM's and external ear canals both ears Nose: turbinates red Throat: lips, mucosa, and tongue normal; teeth and gums normal Neck: no adenopathy, no JVD, supple, symmetrical, trachea midline and thyroid not enlarged, symmetric, no tenderness/mass/nodules Lungs: clear to auscultation bilaterally, no wheezes Heart: regular rate and rhythm, S1, S2 normal, no murmur, click, rub or gallop Abdomen: soft, non-tender; bowel sounds normal; no masses,  no organomegaly Extremities: extremities normal, atraumatic, no cyanosis or edema       Assessment & Plan:

## 2011-08-31 ENCOUNTER — Telehealth: Payer: Self-pay | Admitting: Family Medicine

## 2011-08-31 DIAGNOSIS — R6884 Jaw pain: Secondary | ICD-10-CM | POA: Insufficient documentation

## 2011-08-31 NOTE — Telephone Encounter (Signed)
Patient is calling because her hemorrhoids are bleeding with her bowel movements.  She has an appt on 4/17, we tried to schedule for sooner, but there was nothing that didn't conflict with her schedule.  If there is anything Dr. Denyse Amass would like her to do until that appt, she would appreciate a call.

## 2011-08-31 NOTE — Assessment & Plan Note (Signed)
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  I discussed pain control with her, and will treat conservatively with ice and topical Aspercreme.  Patient to follow up with PCP for further management if the pain continues.

## 2011-08-31 NOTE — Assessment & Plan Note (Signed)
Patient with nasal congestion. Likely related to both URI and allergies, will ask her to continue her allergy medications and treat URI symptomatically.

## 2011-08-31 NOTE — Assessment & Plan Note (Addendum)
Exacerbation related to URI.  No red flags on history, no wheezing on exam today, will continue prednisone burst, also discussed scheduling albuterol, continue controller medications.

## 2011-09-02 NOTE — Telephone Encounter (Signed)
Acknowledge. Will f/u this on Tuesday when I am in clinic. If not getting better advised topic hemorrhoid cream or clinic visit.

## 2011-09-03 NOTE — Telephone Encounter (Signed)
Called to say that she got a letter in the mail and they denied her OV here and also the nutrition appt of 4/5.  Wants [place] to call [phone]  Health Provider.  She is also going to have her uncle bring the letter by for [place] to fill out.

## 2011-09-03 NOTE — Telephone Encounter (Signed)
Fwd. To Dr.Corey 

## 2011-09-03 NOTE — Telephone Encounter (Signed)
Called pt and informed. She wants Dr.Corey to know, that she will be home tomorrow at 3:30 pm or 4 pm. (after daycare) Fwd. To Dr.Corey

## 2011-09-05 ENCOUNTER — Telehealth: Payer: Self-pay | Admitting: Family Medicine

## 2011-09-05 NOTE — Telephone Encounter (Signed)
Still waiting for you to call her.  Want to discuss issue she left the msg about.

## 2011-09-06 ENCOUNTER — Telehealth: Payer: Self-pay | Admitting: Family Medicine

## 2011-09-06 NOTE — Telephone Encounter (Signed)
Pt c/o of hemorrhoids, vag d/c, and white bumps in mouth.  Wants to know what to do,.  Has an appt on 4/17 -

## 2011-09-06 NOTE — Telephone Encounter (Signed)
Please call patient about above encounter. I filled a long form out and sent it in,. Should be a few weeks to hear back.

## 2011-09-06 NOTE — Telephone Encounter (Signed)
Patient states she is having white "cheesy"  vaginal discharge . White bumps on back of tongue.  Re:  Hemorroids. Had a BM Monday that had blood in the stool. Then she took Miralax and had a loose BM yesterday  that had blood mixed in.  Today had a BM with no blood .   When she has a BM her" bottom" hurts. Had some cream for hemorrhoids but it has expired .    Also states she is urinating frequently and when she urinates sometimes it is red. Suggested she needs appointment with doctor tomorrow but her Social Worker will be coming in the afternoon and also her groceries will come in the morning. So she cannot come tomorrow. Will forward message to Dr. Denyse Amass for advise about all of symptoms.

## 2011-09-06 NOTE — Telephone Encounter (Signed)
I called the number. I received a new form to fill out. I filled it out and sent the last 6 months of office visits with it. It should be a few weeks for the PA to go through.  Please let [name] know what is going on.

## 2011-09-06 NOTE — Telephone Encounter (Signed)
Called pt. Left detailed message for pt. [name], [name]

## 2011-09-07 ENCOUNTER — Telehealth: Payer: Self-pay | Admitting: Family Medicine

## 2011-09-07 MED ORDER — TERCONAZOLE 0.8 % VA CREA: 1.0000 | TOPICAL_CREAM | Freq: Every day | VAGINAL | Status: AC

## 2011-09-07 MED ORDER — CLOTRIMAZOLE 2 % VA CREA: 1.0000 | TOPICAL_CREAM | Freq: Two times a day (BID) | VAGINAL | Status: DC

## 2011-09-07 NOTE — Telephone Encounter (Signed)
Change clotrimazole to terconazole for insurance coverage.

## 2011-09-07 NOTE — Telephone Encounter (Signed)
Called pt back:  1) Vaginal Discharge: Called in vaginal cream for yeast infection.   2) Bumps on tongue: I think it is OK. Will follow up.   3) Hemorrhoid: advised OTC cream.  Will see her Wednesday.

## 2011-09-12 ENCOUNTER — Ambulatory Visit (INDEPENDENT_AMBULATORY_CARE_PROVIDER_SITE_OTHER): Payer: Medicaid Other | Admitting: Family Medicine

## 2011-09-12 ENCOUNTER — Encounter: Payer: Self-pay | Admitting: Family Medicine

## 2011-09-12 VITALS — BP 109/80 | HR 82 | Ht 59.5 in | Wt 145.0 lb

## 2011-09-12 DIAGNOSIS — K149 Disease of tongue, unspecified: Secondary | ICD-10-CM | POA: Insufficient documentation

## 2011-09-12 DIAGNOSIS — L293 Anogenital pruritus, unspecified: Secondary | ICD-10-CM

## 2011-09-12 DIAGNOSIS — N898 Other specified noninflammatory disorders of vagina: Secondary | ICD-10-CM | POA: Insufficient documentation

## 2011-09-12 LAB — POCT WET PREP (WET MOUNT)

## 2011-09-12 MED ORDER — NYSTATIN 100000 UNIT/ML MT SUSP: OROMUCOSAL | Status: DC

## 2011-09-12 NOTE — Patient Instructions (Signed)
Thank you for coming in today. Your vaginal discharge did not show bacterial or yeast infection.  Please use the nystatin swish and spit every 6 hours for one week.  Let me know if you do not feel better.

## 2011-09-13 ENCOUNTER — Encounter: Payer: Self-pay | Admitting: Family Medicine

## 2011-09-13 NOTE — Assessment & Plan Note (Signed)
Wet prep is normal I think this is a normal finding and we will continue to follow. If patient still remains symptomatic will treat symptomatically unless we discovered bacterial vaginosis or yeast for which we did not today on wet prep. Continue to follow monthly at least

## 2011-09-13 NOTE — Assessment & Plan Note (Signed)
Mild tongue irritation present on exam today. Not sure the etiology. Will treat with nystatin swish and swallow.  If still symptomatic will switch to Magic mouthwash

## 2011-09-13 NOTE — Progress Notes (Signed)
[name] is a 36 y.o. female who presents to [facility] today for  1) vaginal itching. Patient recently developed vaginal discharge and itching. She called the clinic and I am periodically started topical terconazole. This worked for some time however she recently developed vaginal itching and wonders if she has recurrence or a bacterial infection. She denies any abdominal pain fevers or chills. Additionally she denies dysuria and feels well.  2) tongue E. rotation and coated. She notes that her tongue has a white plaque on it which is sometimes irritated. This started Monday. She feels well otherwise without any fevers or chills trouble swallowing or breathing.   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 No Chest pain, palpitations, SOB, Fever, Chills, Abd pain, N/V/D.  Medications reviewed. Current Outpatient Prescriptions  Medication Sig Dispense Refill  . albuterol (PROVENTIL,VENTOLIN) 90 MCG/ACT inhaler Inhale 2 puffs into the lungs every 4 (four) hours as needed. For wheezing      . Azelastine HCl (ASTEPRO) 0.15 % SOLN Place 2 sprays into the nose daily.       . beclomethasone (QVAR) 80 MCG/ACT inhaler Inhale 1 puff into the lungs as needed.  1 Inhaler  12  . budesonide-formoterol (SYMBICORT) 160-4.5 MCG/ACT inhaler Inhale 2 puffs into the lungs 2 (two) times daily.        . cetirizine (ZYRTEC) 10 MG tablet Take 10 mg by mouth every morning.       . Cholecalciferol (D3 DOTS) 2000 UNITS TBDP Take 1 tablet by mouth daily.      . Cholecalciferol (VITAMIN D3) 50000 UNITS CAPS Take 1 tablet by mouth every 7 (seven) days.      . cholestyramine light (PREVALITE) 4 G packet Take 4 g by mouth 2 (two) times daily.        . divalproex (DEPAKOTE) 250 MG EC tablet Take 250 mg by mouth 3 (three) times daily. Take 1 tablet in the morning, 1 tablet at noon, and 2 tablets at bedtime      . ferrous sulfate 325 (65 FE) MG  tablet take 1 tablet by mouth once daily with BREAKFAST.  30 tablet  3  . fluticasone (FLONASE) 50 MCG/ACT nasal spray Place 2 sprays into the nose daily.  16 g  10  . guaiFENesin (MUCINEX) 600 MG 12 hr tablet Take 2 tablets (1,200 mg total) by mouth 2 (two) times daily.  120 tablet  0  . lacosamide (VIMPAT) 50 MG TABS Take 100 mg by mouth 2 (two) times daily.      Marland Kitchen levETIRAcetam (KEPPRA) 500 MG tablet Take 500 mg by mouth every 12 (twelve) hours.      . montelukast (SINGULAIR) 10 MG tablet Take 1 tablet (10 mg total) by mouth daily.  30 tablet  12  . Multiple Vitamin (MULTIVITAMIN) tablet Take 1 tablet by mouth daily.      Marland Kitchen nystatin (MYCOSTATIN) 100000 UNIT/ML suspension Swish and spit a teaspoon every 6 hours for 1 week.  180 mL  0  . omeprazole (PRILOSEC) 40 MG capsule Take 40 mg by mouth 2 (two) times daily.       Bertram Gala Glycol-Propyl Glycol (SYSTANE) 0.4-0.3 % SOLN Place 2 drops into both eyes 4 (four) times daily as needed. For dry eyes       . polyethylene glycol (GLYCOLAX) packet Take 17 g by mouth daily as needed. Mix into 8 ounces of fluid-for constipation       .  selenium sulfide (SELSUN) 2.5 % shampoo Apply topically daily. as directed      . sodium chloride (BRONCHO SALINE) inhaler solution Take 1 spray by nebulization 2 (two) times daily.        Marland Kitchen terconazole (TERAZOL 3) 0.8 % vaginal cream Place 1 applicator vaginally at bedtime.  20 g  0  . ursodiol (ACTIGALL) 250 MG tablet Take 500 mg by mouth 2 (two) times daily.          Exam:  BP 109/80  Pulse 82  Ht 4' 11.5" (1.511 m)  Wt 145 lb (65.772 kg)  BMI 28.80 kg/m2  LMP 07/30/2011 Gen: Well NAD HEENT: EOMI,  MMM no significant plaque noted on tongue. Mildly redder than normal  Lungs: CTABL Nl WOB Heart: RRR no MRG Abd: NABS, NT, ND Exts: Non edematous BL  LE, warm and well perfused.   genital: Normal external genitalia. Normal vaginal canal. Cervix is normal. Small amount of white discharge present.  Nontender.  Results for orders placed in visit on 09/12/11 (from the past 72 hour(s))  POCT WET PREP (WET MOUNT)     Status: Normal   Collection Time   09/12/11  1:48 PM      Component Value Range Comment   Source Wet Prep POC VAG      WBC, Wet Prep HPF POC OCC      Bacteria Wet Prep HPF POC 2+ RODS & COCCI      Clue Cells Wet Prep HPF POC None      Yeast Wet Prep HPF POC None      Trichomonas Wet Prep HPF POC NONE

## 2011-09-27 ENCOUNTER — Other Ambulatory Visit: Payer: Self-pay | Admitting: Family Medicine

## 2011-10-05 ENCOUNTER — Telehealth: Payer: Self-pay | Admitting: Family Medicine

## 2011-10-05 NOTE — Telephone Encounter (Signed)
Patient is calling to speak to Dr. Denyse Amass about how the # of Medicaid works.  If the 26 visits that Medicaid approves are lumped together for all of her doctors appts or just for [facility].

## 2011-10-10 ENCOUNTER — Telehealth: Payer: Self-pay | Admitting: Clinical

## 2011-10-10 NOTE — Telephone Encounter (Signed)
Late entry for 09/13/11  When CSW called the [phone] Medicaid number CSW provided to inquire whether pt could obtain more visits CSW informed that the pt should have received a denial letter with the appeal process and the number she/you can call to appeal.The appeal will be over the phone and since she can't do it herself her PCP can call on her behalf.  The pt should have received the denial letter for additional visits with a packet of information. CSW informed PCP and CSW unsure whether pt or PCP has this letter. CSW also unsure whether the hearing date has already passed to appeal. CSW will try to call Medicaid to inquire when her new visits will begin again.  Theresia Bough, MSW, Theresia Majors [phone]

## 2011-10-10 NOTE — Telephone Encounter (Signed)
Clinical Child psychotherapist (CSW) left a message for pt DSS worker Ms. Marina Goodell [phone] to explore when pt new medicaid visits will begin. CSW will await a returned call.  Theresia Bough, MSW, Theresia Majors [phone]

## 2011-10-10 NOTE — Telephone Encounter (Signed)
CSW received a call from pt DSS worker Ms. Marina Goodell who informed CSW that pt new visits will begin November 26, 2011.   CSW contacted the [facility] line [phone] and informed the same information as when CSW previous called. Pt should have received the denial form with information on how to appeal. The representative stated she had no way of finding when the pt appeal date is/was and that the pt should have the letter. CSW also informed pt will be denied additional visits if her medical conditions are not life threatening. CSW recommending for MD to follow up with pt and contact the number listed on the appeal form that pt should have received.  Theresia Bough, MSW, Theresia Majors [phone]

## 2011-10-12 NOTE — Telephone Encounter (Signed)
Patient has been approved for visits at the family practice center starting in July per Dr Denyse Amass.[name], [name]

## 2011-10-16 ENCOUNTER — Telehealth: Payer: Self-pay | Admitting: Family Medicine

## 2011-10-16 NOTE — Telephone Encounter (Signed)
Patient calling because she thought she might need stitches.  She was trying to open a bottle of Orange Juice that was hard to open.  She used a knife to pry it open and was cut.  One of the workers from her daycare told her that it didn't look it would need stitches but she is concerned because the cut is at the knuckle and she didn't know what kind of bandaid to use.  She doesn't think she needs to be seen at this point.  I let her know that she will need to keep her finger straight for the cut to heal since it opens up when she bends her finger.  She is going to ask the pharmacy to bring bandaid, neosporin and possibly a tongue depresser to brace her finger so the cut does keep opening up and if in a couple of days, it doesn't seem to be healing well, she will call to make an appt.

## 2011-10-18 NOTE — Telephone Encounter (Signed)
Acknowledged.

## 2011-10-26 ENCOUNTER — Other Ambulatory Visit: Payer: Self-pay | Admitting: Family Medicine

## 2011-10-30 ENCOUNTER — Ambulatory Visit: Payer: Medicaid Other | Admitting: Family Medicine

## 2011-11-01 ENCOUNTER — Telehealth: Payer: Self-pay | Admitting: Family Medicine

## 2011-11-01 ENCOUNTER — Telehealth: Payer: Self-pay | Admitting: *Deleted

## 2011-11-01 MED ORDER — MONTELUKAST SODIUM 10 MG PO TABS: 10.0000 mg | ORAL_TABLET | Freq: Every day | ORAL | Status: DC

## 2011-11-01 MED ORDER — CETIRIZINE HCL 10 MG PO TABS: 10.0000 mg | ORAL_TABLET | ORAL | Status: DC

## 2011-11-01 NOTE — Telephone Encounter (Signed)
Spoke with pt. She was VERY UPSET, because she has lost her medicines on the bus. She had it in a bag and most have left it. She called her Day Care, but they were unable to help her. She also called the Bus, but they did not find her medications. I stayed on the phone with the pt for over 20 minutes and tried my BEST to calm her down. She cried most of the time, because she said, that it has happened before. She needs help with keeping up with her medications. She had a similar situation before and Medicaid did not pay for her medications because it was an early refill. One of her relatives ended up paying for it.  She also went over and over the situation about needing an exercise program. I told [name], that I will send her 3 medications (that she needed most) to her pharmacy and that I would call her back tomorrow and check on her. (also, I will call her social worker to get her more help) Pt calmed down before I hung up the phone.  [name], [name]

## 2011-11-01 NOTE — Telephone Encounter (Signed)
Called pt. Left message to call her social worker for any recommendations about Day Care. [name], [name]

## 2011-11-01 NOTE — Telephone Encounter (Signed)
Patient is calling because she wants to know where else she can go for activity because the Day Care isn't working out.  It is ok to leave a message.

## 2011-11-02 ENCOUNTER — Telehealth: Payer: Self-pay | Admitting: *Deleted

## 2011-11-02 MED ORDER — URSODIOL 250 MG PO TABS: 500.0000 mg | ORAL_TABLET | Freq: Two times a day (BID) | ORAL | Status: DC

## 2011-11-02 NOTE — Telephone Encounter (Signed)
Sent in rx for meds. F/u per SW and with new doctor in July when medicaid will pay for it.

## 2011-11-02 NOTE — Telephone Encounter (Signed)
Pt called back. Social workers: Sherald Barge [phone] Gillis Santa [phone] 1.) called Marylene Land and left message. 2.)called Deidra. Wrong number. [name], [name] Waiting for call back.

## 2011-11-02 NOTE — Telephone Encounter (Signed)
Called pt. She reports that she is feeling better. She will call me back with the numbers of her Social workers (Medicaid, Day Care). I will talk with them to touch base and may find out, how we can help her with daily life tasks. Pt agreed and will call back.  [name], [name]

## 2011-11-02 NOTE — Telephone Encounter (Signed)
Addended by: Rodolph Bong on: 11/02/2011 11:36 AM   Modules accepted: Orders

## 2011-11-08 NOTE — Telephone Encounter (Signed)
Candy Sledge called back from DSS and states she needs a consent form signed by Ms Fuente before she can discuss her on the phone.

## 2011-11-09 ENCOUNTER — Encounter: Payer: Self-pay | Admitting: Family Medicine

## 2011-11-09 ENCOUNTER — Ambulatory Visit (INDEPENDENT_AMBULATORY_CARE_PROVIDER_SITE_OTHER): Payer: Medicaid Other | Admitting: Family Medicine

## 2011-11-09 VITALS — BP 119/80 | HR 90 | Ht 59.5 in | Wt 148.0 lb

## 2011-11-09 DIAGNOSIS — R300 Dysuria: Secondary | ICD-10-CM

## 2011-11-09 DIAGNOSIS — R319 Hematuria, unspecified: Secondary | ICD-10-CM

## 2011-11-09 LAB — POCT URINALYSIS DIPSTICK
Bilirubin, UA: NEGATIVE
Blood, UA: NEGATIVE
Ketones, UA: NEGATIVE
Nitrite, UA: NEGATIVE
pH, UA: 5.5

## 2011-11-09 NOTE — Patient Instructions (Addendum)
Thank you for coming in today.  I will call you with the test results. I willl figure out the day care.  See me in 1 month.  Call or go to the emergency room if you get worse, have trouble breathing, have chest pains, or palpitations.

## 2011-11-10 LAB — COMPLETE METABOLIC PANEL WITH GFR
BUN: 8 mg/dL (ref 6–23)
CO2: 29 mEq/L (ref 19–32)
Calcium: 8.7 mg/dL (ref 8.4–10.5)
Chloride: 102 mEq/L (ref 96–112)
Creat: 0.33 mg/dL — ABNORMAL LOW (ref 0.50–1.10)
GFR, Est African American: >89 mL/min
GFR, Est Non African American: >89 mL/min
Glucose, Bld: 86 mg/dL (ref 70–99)

## 2011-11-12 ENCOUNTER — Encounter: Payer: Self-pay | Admitting: Family Medicine

## 2011-11-12 ENCOUNTER — Telehealth: Payer: Self-pay | Admitting: Family Medicine

## 2011-11-12 NOTE — Assessment & Plan Note (Signed)
Patient has occasional off and on dark-colored urine. She may be having intermittent hematuria.  I see no evidence of blood in her urine today. We will continue to follow this per myself and her liver doctor.

## 2011-11-12 NOTE — Telephone Encounter (Signed)
Nurse is calling to give fax # for the Medical History that she discussed with Dr. Denyse Amass.  That fax # is [phone].

## 2011-11-12 NOTE — Progress Notes (Signed)
[name] is a 36 y.o. female who presents to [facility] today for dysuria.  Patient notes dysuria occurring off and on since last week. She notes increased urine and dark colored urine. She denies any pain fevers chills itching vaginal discharge nausea vomiting or diarrhea.  Her medications have not changed and are listed below. She feels well otherwise.   PMH: Reviewed significant for liver disease History  Substance Use Topics  . Smoking status: Never Smoker   . Smokeless tobacco: Never Used  . Alcohol Use: No   ROS as above  Medications reviewed. Current Outpatient Prescriptions  Medication Sig Dispense Refill  . albuterol (PROVENTIL,VENTOLIN) 90 MCG/ACT inhaler Inhale 2 puffs into the lungs every 4 (four) hours as needed. For wheezing      . Azelastine HCl (ASTEPRO) 0.15 % SOLN Place 2 sprays into the nose daily.       . beclomethasone (QVAR) 80 MCG/ACT inhaler Inhale 1 puff into the lungs as needed.  1 Inhaler  12  . budesonide-formoterol (SYMBICORT) 160-4.5 MCG/ACT inhaler Inhale 2 puffs into the lungs 2 (two) times daily.        . cetirizine (ZYRTEC) 10 MG tablet Take 1 tablet (10 mg total) by mouth every morning.  30 tablet  2  . Cholecalciferol (D3 DOTS) 2000 UNITS TBDP Take 1 tablet by mouth daily.      . Cholecalciferol (VITAMIN D3) 50000 UNITS CAPS Take 1 tablet by mouth every 7 (seven) days.      . cholestyramine light (PREVALITE) 4 G packet Take 4 g by mouth 2 (two) times daily.        . divalproex (DEPAKOTE) 250 MG EC tablet Take 250 mg by mouth 3 (three) times daily. Take 1 tablet in the morning, 1 tablet at noon, and 2 tablets at bedtime      . ferrous sulfate 325 (65 FE) MG tablet take 1 tablet by mouth once daily with BREAKFAST.  30 tablet  3  . fluticasone (FLONASE) 50 MCG/ACT nasal spray Place 2 sprays into the nose daily.  16 g  10  . guaiFENesin (MUCINEX) 600 MG 12 hr tablet Take 2 tablets (1,200 mg total) by mouth 2 (two) times daily.  120 tablet  0  . lacosamide  (VIMPAT) 50 MG TABS Take 100 mg by mouth 2 (two) times daily.      Marland Kitchen levETIRAcetam (KEPPRA) 500 MG tablet Take 500 mg by mouth every 12 (twelve) hours.      . montelukast (SINGULAIR) 10 MG tablet Take 1 tablet (10 mg total) by mouth daily.  30 tablet  12  . Multiple Vitamin (MULTIVITAMIN) tablet Take 1 tablet by mouth daily.      Marland Kitchen nystatin (MYCOSTATIN) 100000 UNIT/ML suspension Swish and spit a teaspoon every 6 hours for 1 week.  180 mL  0  . omeprazole (PRILOSEC) 40 MG capsule Take 40 mg by mouth 2 (two) times daily.       Bertram Gala Glycol-Propyl Glycol (SYSTANE) 0.4-0.3 % SOLN Place 2 drops into both eyes 4 (four) times daily as needed. For dry eyes       . polyethylene glycol (GLYCOLAX) packet Take 17 g by mouth daily as needed. Mix into 8 ounces of fluid-for constipation       . selenium sulfide (SELSUN) 2.5 % shampoo use once daily as directed  236 mL  0  . sodium chloride (BRONCHO SALINE) inhaler solution Take 1 spray by nebulization 2 (two) times daily.        Marland Kitchen  ursodiol (ACTIGALL) 250 MG tablet Take 2 tablets (500 mg total) by mouth 2 (two) times daily.  60 tablet  5    Exam:  BP 119/80  Pulse 90  Ht 4' 11.5" (1.511 m)  Wt 148 lb (67.132 kg)  BMI 29.39 kg/m2 Gen: Well NAD HEENT: EOMI,  MMM Lungs: CTABL Nl WOB Heart: RRR soft nonradiating systolic murmur Abd: NABS, NT, ND Exts: Non edematous BL  LE, warm and well perfused.   Results for orders placed in visit on 11/09/11 (from the past 72 hour(s))  POCT URINALYSIS DIPSTICK     Status: Normal   Collection Time   11/09/11 12:08 PM      Component Value Range Comment   Color, UA YELLOW      Clarity, UA CLEAR      Glucose, UA NEG      Bilirubin, UA NEG      Ketones, UA NEG      Spec Grav, UA <=1.005      Blood, UA NEG      pH, UA 5.5      Protein, UA NEG      Urobilinogen, UA 0.2      Nitrite, UA NEG      Leukocytes, UA Negative     COMPLETE METABOLIC PANEL WITH GFR     Status: Abnormal   Collection Time   11/09/11  12:08 PM      Component Value Range Comment   Sodium 137  135 - 145 mEq/L    Potassium 4.1  3.5 - 5.3 mEq/L    Chloride 102  96 - 112 mEq/L    CO2 29  19 - 32 mEq/L    Glucose, Bld 86  70 - 99 mg/dL    BUN 8  6 - 23 mg/dL    Creat 4.09 (*) 8.11 - 1.10 mg/dL    Total Bilirubin 3.3 (*) 0.3 - 1.2 mg/dL    Alkaline Phosphatase 217 (*) 39 - 117 U/L    AST 125 (*) 0 - 37 U/L    ALT 95 (*) 0 - 35 U/L    Total Protein 7.7  6.0 - 8.3 g/dL    Albumin 3.6  3.5 - 5.2 g/dL    Calcium 8.7  8.4 - 91.4 mg/dL    GFR, Est African American >89      GFR, Est Non African American >89

## 2011-11-14 NOTE — Telephone Encounter (Signed)
Patient called back very excited to let Dr. Denyse Amass know that she found the bag of medications that she thought was lost on transportation.

## 2011-11-14 NOTE — Telephone Encounter (Signed)
Patient called back to finish previous message.  Her Uncle has received 2 more bills that are not going to be covered and she was wondering if since she had to go to her Asthma doctor the other day, does she still have any visit left that Medicaid will pay for.

## 2011-11-14 NOTE — Telephone Encounter (Signed)
Patient is calling for Dr. Denyse Amass.  She had come in on Friday due to weakness and on Monday she woke up and had a severe asthma attack and had to go to her asthma doctor where they gave her a breathing treatment and prednisone, increased the dose of Qvar for 10 days, then to take 2 pills once the day.  She is wondering what he found out about what Medicaid will cover with other MD's.  Call was disconnected before completed.

## 2011-11-15 ENCOUNTER — Telehealth: Payer: Self-pay | Admitting: Family Medicine

## 2011-11-15 NOTE — Telephone Encounter (Signed)
Mailed back last week. It will be a while

## 2011-11-15 NOTE — Telephone Encounter (Signed)
Checking status of FL2 that was brought at last appt

## 2011-11-15 NOTE — Telephone Encounter (Signed)
Fwd. To PCP .Mea Ozga  

## 2011-11-15 NOTE — Telephone Encounter (Signed)
Will follow. FL2 sent in. Awaiting fax from other complay

## 2011-12-17 ENCOUNTER — Telehealth: Payer: Self-pay | Admitting: Family Medicine

## 2011-12-17 NOTE — Telephone Encounter (Signed)
Please patient back about ?insect bite

## 2011-12-17 NOTE — Telephone Encounter (Signed)
Called patient back and talked with her for 30 minutes. Difficult to understand why she called but is is basically upset because at Daycare last Thursday she reported to nurse at the center that she was having trouble breathing after some type of aerosol spray to help nail polish dry was sprayed. She didn't feel like she was taken  seriously . She was told to call her doctor.   She states it  hurt her feelings.  She talked about problems in past she has had with asthma, chest pain and rashes due to some medication  that Dr. Dulce Sellar gave her.  She talked fast and randomly and it was difficult to understand why she originally called.   Appointment scheduled with PCP 01/03/2012. Offered work in appointment tomorrow if she feels she needs to see doctor. States she is feeling ok today. It seems she is not really concerned about the area on her wrist  that she thought was a bug bite. She has put hydrocortisone cream and a Band-Aid on it and it has gone down in size.    Advised her to call her Social Worker about her concerns with daycare..   She talked about medicaid only allowing 22 visit in a year and paperwork Dr. Denyse Amass filled out.   Then she started crying because today is the day her grandmother died in 11.   She will call her allergist  about a concern she has with Qvar and how she was told to use it at last visit.      She has her Social Worker's name and number  and will call .  Advised her that we  will work her in if she needs to be seen.

## 2012-01-03 ENCOUNTER — Encounter: Payer: Self-pay | Admitting: Family Medicine

## 2012-01-03 ENCOUNTER — Ambulatory Visit (INDEPENDENT_AMBULATORY_CARE_PROVIDER_SITE_OTHER): Payer: Medicaid Other | Admitting: Family Medicine

## 2012-01-03 VITALS — BP 109/72 | HR 96 | Ht 59.5 in | Wt 143.0 lb

## 2012-01-03 DIAGNOSIS — K743 Primary biliary cirrhosis: Secondary | ICD-10-CM

## 2012-01-03 DIAGNOSIS — J45909 Unspecified asthma, uncomplicated: Secondary | ICD-10-CM

## 2012-01-03 DIAGNOSIS — R079 Chest pain, unspecified: Secondary | ICD-10-CM | POA: Insufficient documentation

## 2012-01-03 DIAGNOSIS — K745 Biliary cirrhosis, unspecified: Secondary | ICD-10-CM

## 2012-01-03 DIAGNOSIS — R011 Cardiac murmur, unspecified: Secondary | ICD-10-CM

## 2012-01-03 NOTE — Progress Notes (Signed)
Patient ID: [name], female   DOB: 1975-09-10, 36 y.o.   MRN: [id number] Patient ID: [name]    DOB: 12/23/1975, 36 y.o.   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 Concerns include the following: - episodes of chest discomfort and shortness of breath during the month of July. This happens off and on. Not every day. Patient unable to tell me exactly how long it lasts. Last episode a week before last. Not having any pain currently. Thought it could be due to her acid reflux and started taking prilosec 40mg  twice daily which has not helped. She also takes albuterol for the shortness of breath which temporarily helps. Report that pain starts in right breast area and radiates to her right arm and back with some weakness in the right arm and dizziness.  Denies any anxiety or panic attack.   ROS: see HPI Past Medical History: reviewed and updated medications and allergies. Social History: Tobacco: Denies  Objective: Filed Vitals:   01/03/12 1334  BP: 109/72  Pulse: 96    Physical Examination:   General appearance - alert, well appearing, and in no distress Chest - clear to auscultation, no wheezes, rales or rhonchi, symmetric air entry Heart - normal rate, regular rhythm, normal S1, S2, 2/6 systolic murmur best heart at right sternal border 2nd intercostal, minimal tenderness to palpation along right breast  Abdomen - soft, nontender, nondistended, no masses or organomegaly Extremities - peripheral pulses normal, no pedal edema, no clubbing or cyanosis

## 2012-01-03 NOTE — Patient Instructions (Signed)
For the shortness of breath, I will check an ultrasound of your heart to make sure that everything is going ok there.  It is difficult to say what is causing your symptoms at this time. Please return to clinic as soon as you start having these symptoms again.   Continue taking your asthma medicine and your heart burn medicine.

## 2012-01-03 NOTE — Assessment & Plan Note (Addendum)
DIfficult to assess what could cause pain given that she is not currently having any pain. Advised that she return to the clinic as soon as she started having similar symptoms so that we could evaluate her. Continue Prilosec twice daily encases related to peptic ulcer disease.  Will obtain cardiac echo given the presence of a heart murmur. Rule out any aortic stenosis which could be causing shortness of breath, although since this is unlikely. Since patient is not having active chest pain we'll not work her up with EKG, her chest x-ray at this time, but will in the future if she presents with symptoms. Upon review of problem list noticed problem of somatizations disorder, which could very well explain patient's symptoms.

## 2012-01-05 NOTE — Assessment & Plan Note (Signed)
Order 2-D echo.

## 2012-01-05 NOTE — Assessment & Plan Note (Signed)
Continue current management her allergy doctor.

## 2012-01-10 ENCOUNTER — Ambulatory Visit (HOSPITAL_COMMUNITY)
Admission: RE | Admit: 2012-01-10 | Discharge: 2012-01-10 | Disposition: A | Discharge status: Home / Self Care | Payer: Medicaid Other | Source: Ambulatory Visit | Attending: Family Medicine | Admitting: Family Medicine

## 2012-01-10 ENCOUNTER — Telehealth: Payer: Self-pay | Admitting: Family Medicine

## 2012-01-10 DIAGNOSIS — R0609 Other forms of dyspnea: Secondary | ICD-10-CM | POA: Insufficient documentation

## 2012-01-10 DIAGNOSIS — R011 Cardiac murmur, unspecified: Secondary | ICD-10-CM

## 2012-01-10 DIAGNOSIS — R0989 Other specified symptoms and signs involving the circulatory and respiratory systems: Secondary | ICD-10-CM | POA: Insufficient documentation

## 2012-01-10 DIAGNOSIS — E785 Hyperlipidemia, unspecified: Secondary | ICD-10-CM | POA: Insufficient documentation

## 2012-01-10 NOTE — Progress Notes (Signed)
  Echocardiogram 2D Echocardiogram has been performed.  Emelia Loron 01/10/2012, 1:07 PM

## 2012-01-10 NOTE — Telephone Encounter (Signed)
EMERGENCY LINE CALL:  Patient is calling because right side went weak, a few minutes ago but has been happening for months.  Some numbness/tingling on the right side.  Is still able to walk, but feels off balance.  Also having difficulties catching her breath.  Has been having some chest pains off and on for the past month.  Had ECHO earlier today at the hospital.  It appears pt is really calling because she wants the results of the ECHO-- told pt she could discuss more in detail with her PCP, but that is looks grossly normal.  Advised pt come to ED for evaluation of right sided weakness, but patient isn't sure she feels like coming in tonight.  Told pt that if the weakness is stable/similar to weakness she has been having off and on for the past few months, that she could wait until tomorrow morning and come to clinic, but that my advice was for her to come in to the ED tonight for evaluation.  Patient with history of mental retardation on her problem list, but was able to repeat back that I think she should come tonight, but again stated she would rather wait until tomorrow for evaluation.  Patient does not sound distressed on the phone and was quite cheerful once she was told results of her ECHO.

## 2012-01-11 ENCOUNTER — Telehealth: Payer: Self-pay | Admitting: Family Medicine

## 2012-01-11 NOTE — Telephone Encounter (Signed)
Pt is calling to get results of her Echo.[name]

## 2012-01-11 NOTE — Telephone Encounter (Signed)
Will fwd. To Dr.Losq for review. .[name]  

## 2012-01-11 NOTE — Telephone Encounter (Signed)
Pt is calling to get the results

## 2012-01-14 ENCOUNTER — Telehealth: Payer: Self-pay | Admitting: Family Medicine

## 2012-01-14 ENCOUNTER — Ambulatory Visit (HOSPITAL_COMMUNITY)
Admission: RE | Admit: 2012-01-14 | Discharge: 2012-01-14 | Disposition: A | Discharge status: Home / Self Care | Payer: Medicaid Other | Source: Ambulatory Visit | Attending: Family Medicine | Admitting: Family Medicine

## 2012-01-14 ENCOUNTER — Ambulatory Visit (INDEPENDENT_AMBULATORY_CARE_PROVIDER_SITE_OTHER): Payer: Medicaid Other | Admitting: Family Medicine

## 2012-01-14 VITALS — BP 115/79 | HR 93 | Ht 59.5 in | Wt 151.0 lb

## 2012-01-14 DIAGNOSIS — R079 Chest pain, unspecified: Secondary | ICD-10-CM | POA: Insufficient documentation

## 2012-01-14 DIAGNOSIS — R0602 Shortness of breath: Secondary | ICD-10-CM | POA: Insufficient documentation

## 2012-01-14 DIAGNOSIS — R1011 Right upper quadrant pain: Secondary | ICD-10-CM | POA: Insufficient documentation

## 2012-01-14 MED ORDER — OXYCODONE HCL 5 MG PO TABS: 5.0000 mg | ORAL_TABLET | Freq: Four times a day (QID) | ORAL | Status: DC | PRN

## 2012-01-14 NOTE — Progress Notes (Signed)
Patient ID: [name], female   DOB: 1975/12/02, 36 y.o.   MRN: [id number] Patient ID: Taunja Brickner Maslanka    DOB: [birth year], 36 y.o.   MRN: [id number] --- Subjective:  Dacey is a 36 y.o.female with h/o primary biliary cirrhosis, seizure d/o, MR who presents as a walk in with complaint of right sided chest pain, shortness of breath and weakness on right side. She reported having similar episodes in the last month or so and was told to come to clinic or to the ED if it happened again.  - patient reports that this morning she had a hard time breathing and started having pain under her right rib. She took her inhaler which helped with the breathing but did not help with the pain. She felt dizzy and had right sided weakness that spread to her right arm and leg and to the rest of her body. Pain became worst later this afternoon after eating at lunch and she decided to come. She also reports right lower back pain. She also states that a couple of months she fell on her right side.  PAin is not worst with movement or activity. Pain is not worst with breathing.   ROS: see HPI Past Medical History: reviewed and updated medications and allergies. Social History: Tobacco: denies  Objective: Filed Vitals:   01/14/12 1625  BP: 115/79  Pulse: 93    Physical Examination:   General appearance - alert, well appearing, and in no distress Chest - clear to auscultation, no wheezes, rales or rhonchi, symmetric air entry Heart - normal rate, regular rhythm, normal S1, S2, no murmurs, rubs, clicks or gallops Abdomen - distended, no ascites, tenderness to palpation along right upper quadrant, along right rib Extremities - peripheral pulses normal, no pedal edema, no clubbing or cyanosis    Date: @EDTODAY @  Rate: 90  Rhythm: normal sinus rhythm  QRS Axis: right axis deviation  Intervals: normal  ST/T Wave abnormalities: normal  Conduction Disutrbances: none

## 2012-01-14 NOTE — Telephone Encounter (Signed)
Patient is calling back waiting to hear back from her MD.

## 2012-01-14 NOTE — Telephone Encounter (Signed)
Is asking for a home health agency to help her in her home.  Did not give a specific agency (up to patient) - but wanted to get this started for her.

## 2012-01-14 NOTE — Patient Instructions (Addendum)
I am going to order an ultrasound of your abdomen to see if there is anything unusual. I would also like for you to come back tomorrow for a urine sample to make sure you do not have an infection. For the pain, you can take a pain pill in case it gets to be too bad.

## 2012-01-14 NOTE — Assessment & Plan Note (Addendum)
EKG normal. Give presentation, unlikely cardiac in nature.Vitals normal making PE unlikely. Differential includes rib pain from previous fall vs liver/biliary process. Will obtain abdominal US. Obtain UA and UPT this week. For pain, tylenol 325mg  q6/prn. Per patient, her GI doctor told her tylenol in moderation is safe. PAtient doesn't tolerate opiates (increases seizure threshold) or NSAIDs (given ulcer).  No evidence of focal neurological deficit.  Follow up in 1-2 weeks.

## 2012-01-15 ENCOUNTER — Other Ambulatory Visit (INDEPENDENT_AMBULATORY_CARE_PROVIDER_SITE_OTHER): Payer: Medicaid Other

## 2012-01-15 ENCOUNTER — Telehealth: Payer: Self-pay | Admitting: *Deleted

## 2012-01-15 DIAGNOSIS — R1011 Right upper quadrant pain: Secondary | ICD-10-CM

## 2012-01-15 LAB — POCT URINALYSIS DIPSTICK
Blood, UA: NEGATIVE
Ketones, UA: NEGATIVE
Leukocytes, UA: NEGATIVE
Spec Grav, UA: 1.015
pH, UA: 6

## 2012-01-15 NOTE — Progress Notes (Signed)
Pt returned today for urine pregnancy and routine urine.  Both are negative.

## 2012-01-15 NOTE — Telephone Encounter (Signed)
Called and informed patient of appointment of U/S at [facility] on 01/18/12 at 8:15 and NPO after midnight the night before.Rakeem Colley, [name]

## 2012-01-15 NOTE — Progress Notes (Signed)
Patient ID: [name], female   DOB: September 07, 1975, 36 y.o.   MRN: [id number] Neuro exam performed at the time of office visit: 5/5 strength in deltoid, biceps and triceps bilaterally. 5/5 strength in lower extremities bilaterally. CN2-12 grossly intact.

## 2012-01-16 NOTE — Telephone Encounter (Signed)
Left message to call back. Re: Home Health Agency. [name], [name]

## 2012-01-16 NOTE — Telephone Encounter (Signed)
Called pt. Pt reports, that she had [facility] taking care of her before. One nurse came to her house and smoked in her house, another nurse just watched TV and another one went shopping... I told the pt, that I also have talked with her Child psychotherapist. She is checking on her several times a month by phone and Face to Face every 3 months. Pt had questions about co-pays, 'liver doctor'... She reports I advised the pt to keep her appt with Dr.Losq on 01-31-12 to discuss to possibility for help. We have to evaluate her first. Spent 15 min on the phone with pt. She had many concerns. She will keep her [place] appt on Fri and she was happy after I answered all of her questions. I told her, that I also would let Dr.Losq know about her experience with [facility]. [name], [name]

## 2012-01-18 ENCOUNTER — Ambulatory Visit (HOSPITAL_COMMUNITY)
Admission: RE | Admit: 2012-01-18 | Discharge: 2012-01-18 | Disposition: A | Discharge status: Home / Self Care | Payer: Medicaid Other | Source: Ambulatory Visit | Attending: Family Medicine | Admitting: Family Medicine

## 2012-01-18 DIAGNOSIS — K745 Biliary cirrhosis, unspecified: Secondary | ICD-10-CM | POA: Insufficient documentation

## 2012-01-18 DIAGNOSIS — R1011 Right upper quadrant pain: Secondary | ICD-10-CM | POA: Insufficient documentation

## 2012-01-29 ENCOUNTER — Telehealth: Payer: Self-pay | Admitting: Family Medicine

## 2012-01-29 NOTE — Telephone Encounter (Signed)
Patient is calling for her lab results.  She will be leaving her home at 11:00 to go see her Seizure doctor and would like the results before then if possible.

## 2012-01-29 NOTE — Telephone Encounter (Signed)
Faxed to [phone] .[name]

## 2012-01-29 NOTE — Telephone Encounter (Addendum)
Pt is calling again and asking for [place] to fax lab results to Dr Dulce Sellar (her liver doctor)

## 2012-01-31 ENCOUNTER — Telehealth: Payer: Self-pay | Admitting: Family Medicine

## 2012-01-31 ENCOUNTER — Ambulatory Visit: Payer: Medicaid Other | Admitting: Family Medicine

## 2012-01-31 NOTE — Telephone Encounter (Signed)
Spoke with patient . After a lengthy conversation it is determined that she wants to know results of abdomen ultrasound and wants to make sure Dr. Dulce Sellar has a copy of the report.     Advised patient that we can schedule an appointment if she is having trouble breathing. She is concerned that medicaid will not pay for . Advised  patient to please let us know if she wants an appointment , that we are unable to treat her over the phone. . States she came in for same problem last visit and this is when she was sent for [place] .   She is wondering is some of the medicine she is on causes breathing problem and lightheadedness.  Advised patient that she will need appointment to see doctor to determine this.  She doesn't want to schedule now. Had appointment yesterday and cancelled.   Advised patient if breathing gets worse to go to ED    Will send message to Dr. Gwenlyn Saran to please let patient know about [place] report. Faxed report to Dr. Dulce Sellar.

## 2012-01-31 NOTE — Telephone Encounter (Signed)
Pt is feeling lightheaded and hard time breathing - wants to talk to nurse

## 2012-01-31 NOTE — Telephone Encounter (Signed)
Sent letter with [place] results to patient

## 2012-02-04 ENCOUNTER — Telehealth: Payer: Self-pay | Admitting: Family Medicine

## 2012-02-04 NOTE — Telephone Encounter (Signed)
Pt just called to let us know she is in contact with her allergist, Dr Willa Rough and they have adjusted her QVar and Symbicort while she has this asthma flare-up.Pt sounds fine on the phone and really just wanted to update [place], and states she has an OV sched w./us early October.

## 2012-02-04 NOTE — Telephone Encounter (Signed)
FYI - wanted to let her doctor know that she was exposed to live chickens this morning and that she started sneezing and coughing because she was allergic to them.

## 2012-02-06 ENCOUNTER — Telehealth: Payer: Self-pay | Admitting: Family Medicine

## 2012-02-06 NOTE — Telephone Encounter (Signed)
Patient is calling about a bug bite on her leg.  It has been raised, reddish/purplish and irritated.  Dr. Willa Rough is going to see her about this bite.

## 2012-02-08 ENCOUNTER — Telehealth: Payer: Self-pay | Admitting: Family Medicine

## 2012-02-08 NOTE — Telephone Encounter (Signed)
Message left to return call. . Will try again later.

## 2012-02-08 NOTE — Telephone Encounter (Signed)
Spoke with patient and appointment scheduled for 09/18.

## 2012-02-08 NOTE — Telephone Encounter (Signed)
Dr. Willa Rough from [facility] called---[name] seeing them for some question of increased SOB---Dr Willa Rough says oxygen sats and spirometry look great, she looks great. Was concerned that maybe she had a mild viral illness going on--I looked back and [name] has called 3 times in last 2 weeks, kind of 'worried" about things. I know her fairly well from seeing her at [facility] can be a "worrier" at times.  I will have our triage nursde call her later this pm and see if she wants anappt early next week. If she is not yet home from allergy appt, we can try calling her on Monday AM. Lennox Laity NURSE Can you read bove and give [name] a call later or Monday--I think she is anxious about something and maybe we need to schedule an appt w her THANKS! Denny Levy

## 2012-02-13 ENCOUNTER — Encounter: Payer: Self-pay | Admitting: Family Medicine

## 2012-02-13 ENCOUNTER — Ambulatory Visit (INDEPENDENT_AMBULATORY_CARE_PROVIDER_SITE_OTHER): Payer: Medicaid Other | Admitting: Family Medicine

## 2012-02-13 VITALS — BP 131/80 | HR 108 | Temp 97.9°F | Wt 146.7 lb

## 2012-02-13 DIAGNOSIS — Z23 Encounter for immunization: Secondary | ICD-10-CM

## 2012-02-13 NOTE — Patient Instructions (Addendum)
Your appointment for your orthotics is Monday Sept 23 at 3:00 PM with Dr [name] K[facility] [address]

## 2012-02-14 NOTE — Progress Notes (Signed)
  Subjective:    Patient ID: [name], female    DOB: 1975-09-21, 36 y.o.   MRN: [id number]  HPI #1. Concern about rash. She actually has 2 different areas of skin lesions that are worrying her, one on her posterior thigh that started with what was apparently a bite and then some hyperpigmentation on her knees. The hyperpigmentation has been there for quite a while. Area on the back of her right eye has been there about a week and a half it is improving. It started with an apparent bite. #2. Cough. Has had some increase in her cough at night. Nonproductive. No wheezing. Was seen by her allergist last week. No true shortness of breath although while she's coughing she sometimes gets a little short of breath. She has not changed any of her medicines recently and she's taking them regularly.. she does have a question whether not she should be taking cod liver oil because that is something she started about the time she noticed she was having an increased cough. She is taking that as a dietary supplement. #3. Reports her neurologist is increasing her vimpat does. She evidently had some episode of shaking and tongue biting and then had an EEG which was read as normal. She wanted to clear with [place] whether not was okay to increase her dose.  Review of Systems She's had no unusual weight change, no fevers, sweats, chills. Denies myalgias and arthralgias. Please see history present illness above for other pertinent review of systems.    Objective:   Physical Exam  Vital signs are reviewed GENERAL: Well-developed female no acute distress SKIN: Right posterior thigh there is a large area of bruising that is fading becoming purplish green. There is no tenderness to palpation here. It is not warm. There is no central clearing. She has some hyperpigmentation bilaterally on the anterior knee right over the proximal patella. No lesions. LUNGS: Clear to auscultation bilaterally. No extra worker breathing. No  expiratory wheeze. NECK: No lymphadenopathy, supple. CARDIOVASCULAR: Regular rate and rhythm. I do not hear murmur today.      Assessment & Plan:  #1. Skin lesion which looks like an insect bite that is healing. She also has some mild atopic dermatitis type changes on her anterior knees. I would do nothing different for either of these #2. Cough could be related to allergic rhinitis and some postnasal drip. She's also recently started cod liver oil so I recommended she decrease that to every other day. Perhaps this is causing some increase in reflux symptoms. #3. Regarding her seizure disorder, I will leave  it entirely to her neurologist. I reassured her that if they think she needs to increase her [place], we have no reason to recommend against that. She will return to regular followup. She seemed  reassured by today's eating.

## 2012-02-15 ENCOUNTER — Telehealth: Payer: Self-pay | Admitting: Family Medicine

## 2012-02-15 NOTE — Telephone Encounter (Signed)
Pt is asking to speak with RN re: her menstral cycle, has questions about her cycle being late

## 2012-02-18 ENCOUNTER — Ambulatory Visit (INDEPENDENT_AMBULATORY_CARE_PROVIDER_SITE_OTHER): Payer: Medicaid Other | Admitting: Family Medicine

## 2012-02-18 VITALS — BP 126/79 | Ht 59.0 in | Wt 143.0 lb

## 2012-02-18 DIAGNOSIS — M7740 Metatarsalgia, unspecified foot: Secondary | ICD-10-CM

## 2012-02-18 DIAGNOSIS — M7750 Other enthesopathy of unspecified foot: Secondary | ICD-10-CM

## 2012-02-19 NOTE — Progress Notes (Signed)
Patient ID: [name], female   DOB: 11-13-1975, 36 y.o.   MRN: [id number] Patient here for metatarsalgia, pes planus. We had made her some orthotics. She's starting to have some increased pain on the metatarsal heads particularly the right great metatarsal head. She needs her orthotics adjusted.   ROS. + increased MT head pain EXAM. Vital signs reviewed. GENERAL: Well developed, well nourished, no acute distress FEET pes planus with calous build up B MT head areas (ball or foot) Normal sensation and vascular status Pes planus  A. Metatarsalgia Pes planus I put some additional padding underneath her first ray all the way back to metatarsal head on both sides. Also added some extra small metatarsal pads to both as she's getting a lot of callus formation on the ball of her foot bilaterally. She still has intact sensation. I'll see her back when necessary

## 2012-02-29 ENCOUNTER — Telehealth: Payer: Self-pay | Admitting: Family Medicine

## 2012-02-29 ENCOUNTER — Ambulatory Visit: Payer: Medicaid Other | Admitting: Family Medicine

## 2012-02-29 NOTE — Telephone Encounter (Signed)
Pt states she wants to talk to Dr Gwenlyn Saran re: having a HH Nurse some to her home because it is getting difficult to get to daycare. States she is fine waiting until her appt in 1 wk to discuss.

## 2012-02-29 NOTE — Telephone Encounter (Signed)
Patient has questions about her health problems and how she needs to schedule her appts with her different doctors.  She has tried to talk to her Child psychotherapist who told her to talk to her Medicaid Worker who tells her to talk to her Medical doctor who refers her back to her Child psychotherapist and she isn't getting anything resolved.

## 2012-03-07 ENCOUNTER — Ambulatory Visit (INDEPENDENT_AMBULATORY_CARE_PROVIDER_SITE_OTHER): Payer: Medicaid Other | Admitting: Family Medicine

## 2012-03-07 ENCOUNTER — Encounter: Payer: Self-pay | Admitting: Family Medicine

## 2012-03-07 VITALS — BP 123/80 | HR 93 | Temp 97.9°F | Ht 59.5 in | Wt 149.0 lb

## 2012-03-07 DIAGNOSIS — D649 Anemia, unspecified: Secondary | ICD-10-CM

## 2012-03-07 DIAGNOSIS — R569 Unspecified convulsions: Secondary | ICD-10-CM

## 2012-03-07 DIAGNOSIS — J309 Allergic rhinitis, unspecified: Secondary | ICD-10-CM

## 2012-03-07 DIAGNOSIS — F79 Unspecified intellectual disabilities: Secondary | ICD-10-CM

## 2012-03-07 MED ORDER — FERROUS SULFATE 325 (65 FE) MG PO TABS: 325.0000 mg | ORAL_TABLET | Freq: Two times a day (BID) | ORAL | Status: DC

## 2012-03-07 MED ORDER — FLUTICASONE PROPIONATE 50 MCG/ACT NA SUSP: 1.0000 | Freq: Every day | NASAL | Status: DC

## 2012-03-07 NOTE — Patient Instructions (Addendum)
I am going to put in a referral for home health care and I will try to get an order for physical therapy.

## 2012-03-08 NOTE — Assessment & Plan Note (Signed)
Refilled flonase  

## 2012-03-08 NOTE — Progress Notes (Signed)
Patient ID: [name], female   DOB: [birth year], 36 y.o.   MRN: [id number] Patient ID: [name]    DOB: 1976/03/05, 36 y.o.   MRN: [id number] --- Subjective:  [name] is a 36 y.o.female with h/o primary biliary cirrhosis, seizure d/o, MR who presents for follow up.  - seizures: has been seen by her neurologist in September for right sided arm posturing thought to be due to simple partial seizure. Medications were adjusted and she is currently taiking vimpat 150mg  total daily and levetiracetam 1500mg  total daily. She denies any recent episode of arm weakness and posturing.  - social situation: has been going to daycare and requests the possibility of having some help at home with her medications as she doesn't think she is going to continue going to daycare. She is very active in her church and would rather spend time there during the week.  - headache: bump[place] 3 days ago. The top of her head where she hit it has been tender to touch. Pain has improved. Did not have headache today. No change in vision, no vomiting, no nausea.   ROS: see HPI Past Medical History: reviewed and updated medications and allergies. Social History: Tobacco: denies  Objective: Filed Vitals:   03/07/12 1343  BP: 123/80  Pulse: 93  Temp: 97.9 F (36.6 C)    Physical Examination:   General appearance - alert, well appearing, and in no distress Head - tender to palpation along the superior aspect of the calvarium. No induration or skin changes.  Chest - clear to auscultation, no wheezes, rales or rhonchi, symmetric air entry Heart - normal rate, regular rhythm, normal S1, S2, 2/6 systolic ejection murmur best heard on the right sternal border, not exacerbated with standing.  Abdomen - non tender, no organomegaly  Extremities - peripheral pulses normal, no pedal edema, no clubbing or cyanosis

## 2012-03-08 NOTE — Assessment & Plan Note (Signed)
Patient requesting home health assistance. Will refer for home health to evaluate her for nursing assistance for medications.

## 2012-03-08 NOTE — Assessment & Plan Note (Signed)
Seen by neurology for complex partial seizure disorder as well as new diagnosis of simple partial seizure.  Appears to tolerate medications

## 2012-03-08 NOTE — Progress Notes (Deleted)
Patient ID: [name], female   DOB: July 21, 1975, 36 y.o.   MRN: [id number] Patient ID: [name], female   DOB: 1975/12/11, 36 y.o.   MRN: [id number] Patient ID: [name]    DOB: Dec 03, 1975, 36 y.o.   MRN: [id number] --- Subjective:  [name] is a 36 y.o.female with h/o primary biliary cirrhosis, seizure d/o, MR who presents for follow up.  ROS: see HPI Past Medical History: reviewed and updated medications and allergies. Social History: Tobacco: denies  Objective: Filed Vitals:   03/07/12 1343  BP: 123/80  Pulse: 93  Temp: 97.9 F (36.6 C)    Physical Examination:   General appearance - alert, well appearing, and in no distress Chest - clear to auscultation, no wheezes, rales or rhonchi, symmetric air entry Heart - normal rate, regular rhythm, normal S1, S2, no murmurs, rubs, clicks or gallops Abdomen - distended, no ascites, tenderness to palpation along right upper quadrant, along right rib Extremities - peripheral pulses normal, no pedal edema, no clubbing or cyanosis    Date: @EDTODAY @  Rate: 90  Rhythm: normal sinus rhythm  QRS Axis: right axis deviation  Intervals: normal  ST/T Wave abnormalities: normal  Conduction Disutrbances: none

## 2012-03-10 ENCOUNTER — Telehealth: Payer: Self-pay | Admitting: Family Medicine

## 2012-03-10 NOTE — Telephone Encounter (Signed)
Patient is calling because she says the dosage of Keppra is incorrect.  It says for her to take 500 mg twice a day, but she is supposed to take 1 500mg  in the morning, and 1000mg  at night which means she doesn't have enough of the medication.  She needs this corrected and sent to her pharmacy.  She also has a question about the shampoo, does she need to continue to use it.

## 2012-03-10 NOTE — Telephone Encounter (Signed)
Pt

## 2012-03-10 NOTE — Telephone Encounter (Signed)
Called pt and she request for Dr.Losq to update her Keppra. She reports, that she has given Dr.Losq the update at her last OV. She takes 500mg  in the morning and 1000mg  in the evening. I told her that Dr.Losq will update it, but her 'seizure doctor' will have to refill the Keppra. Re: Shampoo. I told the pt just to use it, if needed. She agreed. Fwd. To Dr.Losq for review. [name], [name]

## 2012-03-27 ENCOUNTER — Ambulatory Visit (INDEPENDENT_AMBULATORY_CARE_PROVIDER_SITE_OTHER): Payer: Medicaid Other | Admitting: Family Medicine

## 2012-03-27 VITALS — BP 135/84 | HR 92 | Temp 98.5°F | Ht 59.5 in | Wt 147.0 lb

## 2012-03-27 DIAGNOSIS — D649 Anemia, unspecified: Secondary | ICD-10-CM

## 2012-03-27 DIAGNOSIS — N920 Excessive and frequent menstruation with regular cycle: Secondary | ICD-10-CM

## 2012-03-27 DIAGNOSIS — J069 Acute upper respiratory infection, unspecified: Secondary | ICD-10-CM

## 2012-03-27 DIAGNOSIS — H543 Unqualified visual loss, both eyes: Secondary | ICD-10-CM

## 2012-03-27 NOTE — Patient Instructions (Addendum)
Thank you for coming in today, it was good to see you I am checking a blood count today since you are reporting weakness and heavy menstrual cycle    Upper Respiratory Infection, Adult An upper respiratory infection (URI) is also known as the common cold. It is often caused by a type of germ (virus). Colds are easily spread (contagious). You can pass it to others by kissing, coughing, sneezing, or drinking out of the same glass. Usually, you get better in 1 or 2 weeks.  HOME CARE   Only take medicine as told by your doctor.  Use a warm mist humidifier or breathe in steam from a hot shower.  Drink enough water and fluids to keep your pee (urine) clear or pale yellow.  Get plenty of rest.  Return to work when your temperature is back to normal or as told by your doctor. You may use a face mask and wash your hands to stop your cold from spreading. GET HELP RIGHT AWAY IF:   After the first few days, you feel you are getting worse.  You have questions about your medicine.  You have chills, shortness of breath, or brown or red spit (mucus).  You have yellow or brown snot (nasal discharge) or pain in the face, especially when you bend forward.  You have a fever, puffy (swollen) neck, pain when you swallow, or white spots in the back of your throat.  You have a bad headache, ear pain, sinus pain, or chest pain.  You have a high-pitched whistling sound when you breathe in and out (wheezing).  You have a lasting cough or cough up blood.  You have sore muscles or a stiff neck. MAKE SURE YOU:   Understand these instructions.  Will watch your condition.  Will get help right away if you are not doing well or get worse. Document Released: 10/31/2007 Document Revised: 08/06/2011 Document Reviewed: 09/18/2010 [facility] Patient Information 2013 [place], [place].

## 2012-03-28 LAB — CBC
HCT: 32.6 % — ABNORMAL LOW (ref 36.0–46.0)
Hemoglobin: 11.3 g/dL — ABNORMAL LOW (ref 12.0–15.0)
MCH: 28.7 pg (ref 26.0–34.0)
RBC: 3.94 MIL/uL (ref 3.87–5.11)

## 2012-03-30 DIAGNOSIS — H543 Unqualified visual loss, both eyes: Secondary | ICD-10-CM | POA: Insufficient documentation

## 2012-03-30 DIAGNOSIS — J069 Acute upper respiratory infection, unspecified: Secondary | ICD-10-CM | POA: Insufficient documentation

## 2012-03-30 NOTE — Assessment & Plan Note (Signed)
History of menorrhagia and fatigue associated with menstrual cycle.  No focal symptoms.  Will check CBC today to eval for anemia.

## 2012-03-30 NOTE — Assessment & Plan Note (Signed)
Symptoms of cough and congestion consistent with URI.  Advised symptomatic treatment and rest.  Return if worsening or failing to improve.

## 2012-03-30 NOTE — Progress Notes (Signed)
  Subjective:    Patient ID: [name], female    DOB: Nov 09, 1975, 36 y.o.   MRN: [id number]  HPI Patient here with multiple complaints and is a poor medical historian   1. Cough:  Cough with congestion and fatigue for 2 days.  Cough mildly productive for yellow sputum.  Mild shortness of breath associated with this.  Does endorse occasional headache.  Denies chest pain, fever, chills, nausea or vomiting.   2. Vision loss:  Episode of vision loss a few weeks ago, not exactly sure when.  States that her vision often gets blurry but this time had complete loss of vision bilaterally.  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  She denies any further episodes since that time.  Vision is fine today.   3. Weakness:  Had weakness on her R side ~3 weeks ago.  States that she followed up with her neurologist after this and he changed some of her seizure medications.  She has not had any further symptoms like this.  Her symptoms now are more of generalized fatigue.  This is worse with her menstrual cycle which she states are heavy and irregular.  She denies any dizziness or lightheadedness.    Review of Systems     Objective:   Physical Exam  Constitutional: She is oriented to person, place, and time. She appears well-nourished. No distress.  HENT:  Head: Normocephalic and atraumatic.  Mouth/Throat: Oropharynx is clear and moist.       Nose is congested.  Eyes: EOM are normal. Pupils are equal, round, and reactive to light.  Neck: Neck supple.  Cardiovascular: Normal rate, regular rhythm and normal heart sounds.   Pulmonary/Chest: Breath sounds normal.  Abdominal: Soft. Bowel sounds are normal. She exhibits no distension.  Musculoskeletal: She exhibits no edema.  Lymphadenopathy:    She has no cervical adenopathy.  Neurological: She is alert and oriented to person, place, and time.  Psychiatric:    Odd affect.           Assessment & Plan:

## 2012-03-30 NOTE — Assessment & Plan Note (Signed)
Unsure of cause, ?seizure vs ocular d/o.  Has not had any further episodes.  Was seen by ophtho on 10/15, dx dry eyes and ?seizure medication may be contributing to complaints.  Instructed if occurs again to follow up.

## 2012-04-01 ENCOUNTER — Telehealth: Payer: Self-pay | Admitting: Family Medicine

## 2012-04-01 NOTE — Telephone Encounter (Signed)
Called pt. Done. [name], [name]

## 2012-04-01 NOTE — Telephone Encounter (Signed)
Fwd to PCP for info. [name], [name]

## 2012-04-01 NOTE — Telephone Encounter (Signed)
Pt called to say that she is now on Vita D 5000 for once a day

## 2012-04-01 NOTE — Telephone Encounter (Signed)
Patient is calling because she forgot to bring the transportation form with her to her appt on 10/31 with Dr. Ashley Royalty.  She would like the nurse to send one in for her please.

## 2012-04-08 ENCOUNTER — Telehealth: Payer: Self-pay | Admitting: Family Medicine

## 2012-04-08 NOTE — Telephone Encounter (Signed)
[facility] is asking for for med teaching every other week and to fill her pill box because she isn't taking her meds correctly.  A verbal ok is ok.

## 2012-04-09 NOTE — Telephone Encounter (Signed)
Will forward to Dr Losq 

## 2012-04-10 ENCOUNTER — Telehealth: Payer: Self-pay | Admitting: *Deleted

## 2012-04-10 ENCOUNTER — Telehealth: Payer: Self-pay | Admitting: Family Medicine

## 2012-04-10 NOTE — Telephone Encounter (Signed)
Called pt. Will fax note. [name], [name]

## 2012-04-10 NOTE — Telephone Encounter (Signed)
Pt needs a note to go to daycare stating that she is not allergic to the regular tylenol and needs it faxed to nurse at the [facility] [phone].

## 2012-04-10 NOTE — Telephone Encounter (Signed)
Called [facility]. Faxed the order as requested to [facility] @ [phone] .Arlyss Repress

## 2012-04-16 ENCOUNTER — Telehealth: Payer: Self-pay | Admitting: Family Medicine

## 2012-04-16 NOTE — Telephone Encounter (Signed)
Pt is coughing and sneezing/runny nose - needs to talk to nurse - she can't come until next week

## 2012-04-16 NOTE — Telephone Encounter (Signed)
Spoke with patient and she complains with coughing, asthma acting up, headache, coughing up dark, greenish mucous, dizzy and weak. Temp has been up to 98. Right side of rib cage is painful also. Advised patient that given all her symptoms that she will need to come in to be evaluated. She cannot get transportation until 11/25. Has to call 3 days ahead of time.  Appointment scheduled 04/21/2012 but she understands to go to Urgent Care or ED if worsens after hours here, or call us back and we will work her in today or anytime she can come.

## 2012-04-18 ENCOUNTER — Ambulatory Visit: Payer: Medicaid Other | Admitting: Family Medicine

## 2012-04-21 ENCOUNTER — Ambulatory Visit (INDEPENDENT_AMBULATORY_CARE_PROVIDER_SITE_OTHER): Payer: Medicaid Other | Admitting: Family Medicine

## 2012-04-21 ENCOUNTER — Encounter: Payer: Self-pay | Admitting: Family Medicine

## 2012-04-21 ENCOUNTER — Telehealth: Payer: Self-pay | Admitting: Family Medicine

## 2012-04-21 VITALS — BP 136/71 | HR 100 | Temp 98.2°F | Ht 60.0 in | Wt 152.0 lb

## 2012-04-21 DIAGNOSIS — J069 Acute upper respiratory infection, unspecified: Secondary | ICD-10-CM

## 2012-04-21 DIAGNOSIS — R040 Epistaxis: Secondary | ICD-10-CM | POA: Insufficient documentation

## 2012-04-21 NOTE — Assessment & Plan Note (Signed)
ASSESSMENT:  viral upper respiratory illness  PLAN: Symptomatic therapy suggested: push fluids, rest and return office visit prn if symptoms persist or worsen. Lack of antibiotic effectiveness discussed with her. Call or return to clinic prn if these symptoms worsen or fail to improve as anticipated. Given 100% O2 saturation and minimal wheezing, unlikely to be asthma exacerbation.

## 2012-04-21 NOTE — Telephone Encounter (Signed)
Fwd. To PCP for info .Umberto Pavek  

## 2012-04-21 NOTE — Patient Instructions (Addendum)
You may try robitussin or delsym for cough. Continue using your inhaler as needed for wheezing.  Return in 10-14 if not improving.   Upper Respiratory Infection, Adult An upper respiratory infection (URI) is also known as the common cold. It is often caused by a type of germ (virus). Colds are easily spread (contagious). You can pass it to others by kissing, coughing, sneezing, or drinking out of the same glass. Usually, you get better in 1 or 2 weeks.  HOME CARE   Only take medicine as told by your doctor.  Use a warm mist humidifier or breathe in steam from a hot shower.  Drink enough water and fluids to keep your pee (urine) clear or pale yellow.  Get plenty of rest.  Return to work when your temperature is back to normal or as told by your doctor. You may use a face mask and wash your hands to stop your cold from spreading. GET HELP RIGHT AWAY IF:   After the first few days, you feel you are getting worse.  You have questions about your medicine.  You have chills, shortness of breath, or brown or red spit (mucus).  You have yellow or brown snot (nasal discharge) or pain in the face, especially when you bend forward.  You have a fever, puffy (swollen) neck, pain when you swallow, or white spots in the back of your throat.  You have a bad headache, ear pain, sinus pain, or chest pain.  You have a high-pitched whistling sound when you breathe in and out (wheezing).  You have a lasting cough or cough up blood.  You have sore muscles or a stiff neck. MAKE SURE YOU:   Understand these instructions.  Will watch your condition.  Will get help right away if you are not doing well or get worse. Document Released: 10/31/2007 Document Revised: 08/06/2011 Document Reviewed: 09/18/2010 [facility] Patient Information 2013 [place], [place].

## 2012-04-21 NOTE — Telephone Encounter (Signed)
Patient is calling to let her doctor know that on 11/13, Dr. Dulce Sellar changed the dose of her Prilosec from 2 times daily to once per day.

## 2012-04-21 NOTE — Progress Notes (Signed)
Patient ID: [name], female   DOB: [birth year], 36 y.o.   MRN: [id number] SUBJECTIVE:  [name] is a 36 y.o. female who complains of congestion, sore throat, cough described as productive of green sputum and epistaxis left side for 7 days. She denies a history of chest pain, fevers, nausea, vomiting and weakness and has a history of asthma. Patient does not smoke cigarettes.  Has been using albuterol inhaler for occasional wheezing. Using humidifier as well as saline nasal spray to prevent nose bleeds.    OBJECTIVE: She appears well, vital signs are as noted. Ears normal.  Throat and pharynx normal.  Neck supple. No adenopathy in the neck. Nose is congested. Sinuses non tender. Chest with subtle expiratory wheezes but overall good air movement.  Marland Kitchen

## 2012-04-21 NOTE — Assessment & Plan Note (Signed)
Nose bleeds likely 2/2 to elevated INR from liver disease as well as irritation from dry air.  Will have her continue saline nasal spray and humidified air

## 2012-04-21 NOTE — Telephone Encounter (Signed)
error 

## 2012-04-29 ENCOUNTER — Telehealth: Payer: Self-pay | Admitting: Family Medicine

## 2012-04-29 NOTE — Telephone Encounter (Signed)
Will fwd. To Dr. Ashley Royalty for review, who saw pt last.  .[name]

## 2012-04-29 NOTE — Telephone Encounter (Signed)
Pt has a URI and was told to stay out of the daycare for 2 weeks.  She is needing [place] to fax a note to them [phone]) stating that she needs to stay out for 2 weeks. (she has an appt here on Thurs)

## 2012-05-01 ENCOUNTER — Telehealth: Payer: Self-pay | Admitting: Family Medicine

## 2012-05-01 ENCOUNTER — Ambulatory Visit (INDEPENDENT_AMBULATORY_CARE_PROVIDER_SITE_OTHER): Payer: Medicaid Other | Admitting: Family Medicine

## 2012-05-01 ENCOUNTER — Encounter: Payer: Self-pay | Admitting: Family Medicine

## 2012-05-01 VITALS — BP 120/77 | HR 94 | Ht 60.0 in | Wt 150.8 lb

## 2012-05-01 DIAGNOSIS — Z8719 Personal history of other diseases of the digestive system: Secondary | ICD-10-CM

## 2012-05-01 DIAGNOSIS — N898 Other specified noninflammatory disorders of vagina: Secondary | ICD-10-CM

## 2012-05-01 DIAGNOSIS — J069 Acute upper respiratory infection, unspecified: Secondary | ICD-10-CM

## 2012-05-01 LAB — POCT WET PREP (WET MOUNT): Clue Cells Wet Prep Whiff POC: NEGATIVE

## 2012-05-01 MED ORDER — DEXTROMETHORPHAN POLISTIREX 30 MG/5ML PO LQCR: 30.0000 mg | Freq: Three times a day (TID) | ORAL | Status: DC | PRN

## 2012-05-01 NOTE — Patient Instructions (Addendum)
For the cough and upper respiratory infection, you can continue with the delsym for the cough. I will also send some other type of medicine to help with the cough, called tesalon perles.   For the rectal bleeding, you can use preparation H cream to apply to the area. If this continues to be a problem, follow up with Dr. Dulce Sellar.   You also have a little tear along your vaginal wall. This should resolve in a day or 2, just wear a pad for a couple of days. I will call you if the test we took comes back with anything.

## 2012-05-01 NOTE — Assessment & Plan Note (Signed)
Reported rectal bleeding. No evidence of obvious hemorrhoid on exam, but given patient's history of hemorrhoids, will treat with miralax and preparation H. If continues to persist, patient to follow up with Dr. Dulce Sellar.

## 2012-05-01 NOTE — Assessment & Plan Note (Signed)
Cough likely from upper respiratory infection. Patient still within 2 weeks of symptom start. No wheezing on exam and no hypoxia make asthma exacerbation unlikely. IF persists, will consider obtaining xray to rule out pneumonia.  Continue delsym. Did not prescribe tessalon perles since she stated they did not help last time.

## 2012-05-01 NOTE — Telephone Encounter (Signed)
Patient is calling wondering if the Tessalon Pearls that she has been given is the same medicine that she took last February.  She is allergic to the recent medication but the liquid she took then worked but [employer] cover it.  She wants to be sure that this doesn't turn into pneumonia.

## 2012-05-01 NOTE — Progress Notes (Signed)
Patient ID: [name]    DOB: [birth year], 36 y.o.   MRN: [id number] --- Subjective:  [name] is a 36 y.o.female with h/o primary biliary cirrhosis, seizure disorder, mild mental retardation who presents for physical. Concerns include the following: - upper respiratory infection: with cough and nasal congestion and mild sore throat. No fever. She was diagnosed with URI late November for which she took Delsym which helped with the cough. She ran out of Delsym on Sunday. She had been doing a bit better but continues to have cough productive of mucus along with nasal congestion. No fever. No chills. She had an asthma flare a couple of days ago which responded to albuterol treatment. Otherwise, no shortness of breath or increased work of breathing.   - rectal bleeding: noticed this am with wiping. No blood in toilet bowel or on stool. Rectal discomfort with bowel movement. BM's have been loose, not hard. She has been taking miralax. Has a history of hemorrhoids for which she has taken preparation H.   - GYN: periods last 4-5 days, occur every 31 days, not associated with major cramping. No h/o STD. No h/o abnormal PAP. Not sexually active. Complaining of malodorous yellowish discharge.    ROS: see HPI Past Medical History: reviewed and updated medications and allergies. Social History: Tobacco: none  Objective: Filed Vitals:   05/01/12 1011  BP: 120/77  Pulse: 94    Physical Examination:   General appearance - alert, well appearing, and in no distress Ears - bilateral TM's and external ear canals normal Nose - normal and patent, mildly erythematous Mouth - mucous membranes moist, pharynx normal without lesions Chest - clear to auscultation, no wheezes, rales or rhonchi, symmetric air entry Heart - normal rate, regular rhythm, normal S1, S2, no murmurs, rubs, clicks or gallops Abdomen - soft, nontender, nondistended, no masses or organomegaly Extremities - peripheral pulses normal, no pedal  edema Rectum - no obvious external hemorrhoid or skin tags, no fissure. No internal hemorrhoids appreciated on digital exam. Fecal occult: negative Pelvic exam: atrophic vaginal tissue, mild white discharge,  NOTE: patient did not tolerate speculum exam and forcefully expelled with valsalva maneuver. As a result, two small periurethral grade 1 tears occurred as well as a midline 1st degree vaginal tear.

## 2012-05-01 NOTE — Assessment & Plan Note (Signed)
Likely physiologic discharge. Follow up wet prep

## 2012-05-01 NOTE — Telephone Encounter (Signed)
Will fwd. To Dr.Losq for review. .Suzzanne Brunkhorst  

## 2012-05-02 ENCOUNTER — Telehealth: Payer: Self-pay | Admitting: Family Medicine

## 2012-05-02 MED ORDER — BENZONATATE 100 MG PO CAPS: 100.0000 mg | ORAL_CAPSULE | Freq: Three times a day (TID) | ORAL | Status: DC | PRN

## 2012-05-02 NOTE — Telephone Encounter (Signed)
Left a message in return to Ms. [name]'s call yesterday. Stated that I am happy to send tessalon perles to the pharmacy which I hadn't done sooner because she didn't think they worked. Also stated that it should be safe to take delsym cough suppressant, but that it may indeed not be covered by medicaid.  Reviewed red flags for return: fever, worsening shortness of breath, no eating or drinking.Marland KitchenMarland Kitchen

## 2012-05-02 NOTE — Telephone Encounter (Signed)
I don't think a URI necessitates a  Two week leave from work.  If Dr. Gwenlyn Saran thinks she can stay out that long I would defer to her.

## 2012-05-09 ENCOUNTER — Telehealth: Payer: Self-pay | Admitting: *Deleted

## 2012-05-09 NOTE — Telephone Encounter (Signed)
Patient calls stating she feel about 8:15 this AM and knee  is bothering her now and she has left chest pain and arm, pain and asthma is acting up.   Advised that she  should go to ED.  States she was having chest pain the last time she came here and had an upper respiratory illness. She wonders if BP dropped is reason she fell or the fact she hasn't eaten this AM yet. Advised I am unable to tell why this happened over the phone and she will need to be evaluated.  She continues giving history about  other issues and states she is going to use her inhaler now. Then states" I feel better now. ". Again encouraged her to go to ED if chest pain  continues.

## 2012-06-10 ENCOUNTER — Ambulatory Visit: Payer: Medicaid Other | Admitting: Family Medicine

## 2012-06-18 ENCOUNTER — Ambulatory Visit (INDEPENDENT_AMBULATORY_CARE_PROVIDER_SITE_OTHER): Payer: Medicaid Other | Admitting: Family Medicine

## 2012-06-18 ENCOUNTER — Encounter: Payer: Self-pay | Admitting: Family Medicine

## 2012-06-18 VITALS — BP 133/84 | HR 89 | Ht 60.0 in | Wt 151.0 lb

## 2012-06-18 DIAGNOSIS — L851 Acquired keratosis [keratoderma] palmaris et plantaris: Secondary | ICD-10-CM

## 2012-06-18 DIAGNOSIS — L852 Keratosis punctata (palmaris et plantaris): Secondary | ICD-10-CM | POA: Insufficient documentation

## 2012-06-18 MED ORDER — CAMPHOR-MENTHOL 0.5-0.5 % EX LOTN: TOPICAL_LOTION | CUTANEOUS | Status: DC | PRN

## 2012-06-18 MED ORDER — AMMONIUM LACTATE 12 % EX CREA: TOPICAL_CREAM | CUTANEOUS | Status: DC | PRN

## 2012-06-18 MED ORDER — TRIAMCINOLONE ACETONIDE 0.025 % EX OINT: TOPICAL_OINTMENT | Freq: Two times a day (BID) | CUTANEOUS | Status: DC

## 2012-06-18 NOTE — Progress Notes (Signed)
Patient ID: [name]    DOB: 1975/10/08, 37 y.o.   MRN: [id number] --- Subjective:  [name] is a 37 y.o.female with h/o primary bilary sclerosis, somatization disorder who presents with rash and itching on hands and feet for a couple of months. She has tried hydrocortisone cream otc which has not helped. No change in medication. No change in laundry detergent or soap. No fever. She has noticed a sore on her right hand and on the top of her toes.   ROS: see HPI Past Medical History: reviewed and updated medications and allergies. Social History: Tobacco: none  Objective: Filed Vitals:   06/18/12 1421  BP: 133/84  Pulse: 89    Physical Examination:   General appearance - alert, well appearing, and in no distress Skin - dry, cracked, hypertrophied area in between thumb and index as well as on heel of both feet. Area of hypertrophic skin in hand creases. Small callus on toes bilaterally

## 2012-06-18 NOTE — Patient Instructions (Addendum)
The itching you are having is most likely from dry skin.  For the feet, you can use lac-hydrin which I am sending to the pharmacy and you can also find it over the counter.  For the hands and feet, I am sending a little cream called triamcinolone for you to use for 2 weeks. Do not use it on the face.  I will also send a prescription for sarna for you to have on hand.   Continue using your lotion on a regular basis.

## 2012-06-18 NOTE — Assessment & Plan Note (Addendum)
Associated with dry skin. Triamcinolone for itching and dry skin. Lac hydrin for thicker areas and sarna for relief since patient asked specifically for it. Return if no better.

## 2012-06-24 ENCOUNTER — Encounter (HOSPITAL_COMMUNITY): Payer: Self-pay | Admitting: Emergency Medicine

## 2012-06-24 ENCOUNTER — Ambulatory Visit: Payer: Medicaid Other | Admitting: Family Medicine

## 2012-06-24 ENCOUNTER — Emergency Department (HOSPITAL_COMMUNITY): Payer: Medicaid Other

## 2012-06-24 ENCOUNTER — Observation Stay (HOSPITAL_COMMUNITY)
Admission: EM | Admit: 2012-06-24 | Discharge: 2012-06-27 | Payer: Medicaid Other | Attending: Family Medicine | Admitting: Family Medicine

## 2012-06-24 DIAGNOSIS — Z01812 Encounter for preprocedural laboratory examination: Secondary | ICD-10-CM

## 2012-06-24 DIAGNOSIS — R319 Hematuria, unspecified: Secondary | ICD-10-CM

## 2012-06-24 DIAGNOSIS — L852 Keratosis punctata (palmaris et plantaris): Secondary | ICD-10-CM

## 2012-06-24 DIAGNOSIS — K743 Primary biliary cirrhosis: Secondary | ICD-10-CM

## 2012-06-24 DIAGNOSIS — F450 Somatization disorder: Secondary | ICD-10-CM

## 2012-06-24 DIAGNOSIS — F200 Paranoid schizophrenia: Principal | ICD-10-CM | POA: Diagnosis present

## 2012-06-24 DIAGNOSIS — R410 Disorientation, unspecified: Secondary | ICD-10-CM

## 2012-06-24 DIAGNOSIS — Q6650 Congenital pes planus, unspecified foot: Secondary | ICD-10-CM

## 2012-06-24 DIAGNOSIS — N938 Other specified abnormal uterine and vaginal bleeding: Secondary | ICD-10-CM

## 2012-06-24 DIAGNOSIS — M1710 Unilateral primary osteoarthritis, unspecified knee: Secondary | ICD-10-CM

## 2012-06-24 DIAGNOSIS — K745 Biliary cirrhosis, unspecified: Secondary | ICD-10-CM | POA: Insufficient documentation

## 2012-06-24 DIAGNOSIS — Z8719 Personal history of other diseases of the digestive system: Secondary | ICD-10-CM

## 2012-06-24 DIAGNOSIS — E872 Acidosis, unspecified: Secondary | ICD-10-CM | POA: Insufficient documentation

## 2012-06-24 DIAGNOSIS — F23 Brief psychotic disorder: Secondary | ICD-10-CM

## 2012-06-24 DIAGNOSIS — D649 Anemia, unspecified: Secondary | ICD-10-CM

## 2012-06-24 DIAGNOSIS — R569 Unspecified convulsions: Secondary | ICD-10-CM

## 2012-06-24 DIAGNOSIS — R011 Cardiac murmur, unspecified: Secondary | ICD-10-CM

## 2012-06-24 DIAGNOSIS — K219 Gastro-esophageal reflux disease without esophagitis: Secondary | ICD-10-CM

## 2012-06-24 DIAGNOSIS — F79 Unspecified intellectual disabilities: Secondary | ICD-10-CM

## 2012-06-24 DIAGNOSIS — F29 Unspecified psychosis not due to a substance or known physiological condition: Secondary | ICD-10-CM

## 2012-06-24 DIAGNOSIS — H543 Unqualified visual loss, both eyes: Secondary | ICD-10-CM

## 2012-06-24 DIAGNOSIS — J45909 Unspecified asthma, uncomplicated: Secondary | ICD-10-CM

## 2012-06-24 DIAGNOSIS — E669 Obesity, unspecified: Secondary | ICD-10-CM

## 2012-06-24 DIAGNOSIS — J309 Allergic rhinitis, unspecified: Secondary | ICD-10-CM

## 2012-06-24 DIAGNOSIS — R079 Chest pain, unspecified: Secondary | ICD-10-CM | POA: Insufficient documentation

## 2012-06-24 DIAGNOSIS — IMO0002 Reserved for concepts with insufficient information to code with codable children: Secondary | ICD-10-CM

## 2012-06-24 DIAGNOSIS — G40909 Epilepsy, unspecified, not intractable, without status epilepticus: Secondary | ICD-10-CM | POA: Insufficient documentation

## 2012-06-24 DIAGNOSIS — D696 Thrombocytopenia, unspecified: Secondary | ICD-10-CM | POA: Insufficient documentation

## 2012-06-24 DIAGNOSIS — N920 Excessive and frequent menstruation with regular cycle: Secondary | ICD-10-CM

## 2012-06-24 HISTORY — DX: Nausea with vomiting, unspecified: R11.2

## 2012-06-24 HISTORY — DX: Other specified postprocedural states: Z98.890

## 2012-06-24 LAB — CBC WITH DIFFERENTIAL/PLATELET
Basophils Relative: 1 % (ref 0–1)
Eosinophils Absolute: 0 10*3/uL (ref 0.0–0.7)
HCT: 34.8 % — ABNORMAL LOW (ref 36.0–46.0)
Hemoglobin: 11.9 g/dL — ABNORMAL LOW (ref 12.0–15.0)
MCH: 28.7 pg (ref 26.0–34.0)
MCHC: 34.2 g/dL (ref 30.0–36.0)
Monocytes Absolute: 0.4 10*3/uL (ref 0.1–1.0)
Monocytes Relative: 9 % (ref 3–12)
Neutro Abs: 3 10*3/uL (ref 1.7–7.7)

## 2012-06-24 LAB — GLUCOSE, CAPILLARY: Glucose-Capillary: 138 mg/dL — ABNORMAL HIGH (ref 70–99)

## 2012-06-24 LAB — COMPREHENSIVE METABOLIC PANEL
AST: 178 U/L — ABNORMAL HIGH (ref 0–37)
CO2: 18 mEq/L — ABNORMAL LOW (ref 19–32)
Calcium: 8.5 mg/dL (ref 8.4–10.5)
Creatinine, Ser: 0.53 mg/dL (ref 0.50–1.10)
GFR calc Af Amer: >90 mL/min (ref >90)
GFR calc non Af Amer: >90 mL/min (ref >90)
Glucose, Bld: 107 mg/dL — ABNORMAL HIGH (ref 70–99)

## 2012-06-24 LAB — ACETAMINOPHEN LEVEL: Acetaminophen (Tylenol), Serum: <15 ug/mL (ref 10–30)

## 2012-06-24 LAB — ETHANOL: Alcohol, Ethyl (B): <11 mg/dL (ref 0–11)

## 2012-06-24 LAB — SALICYLATE LEVEL: Salicylate Lvl: <2 mg/dL — ABNORMAL LOW (ref 2.8–20.0)

## 2012-06-24 LAB — APTT: aPTT: 48 seconds — ABNORMAL HIGH (ref 24–37)

## 2012-06-24 LAB — ABO/RH: ABO/RH(D): O POS

## 2012-06-24 LAB — PROTIME-INR: INR: 1.79 — ABNORMAL HIGH (ref 0.00–1.49)

## 2012-06-24 MED ORDER — SODIUM CHLORIDE 0.9 % IV SOLN
1000.0000 mL | INTRAVENOUS | Status: DC
  Administered 2012-06-24 – 2012-06-26 (×3): 1000 mL via INTRAVENOUS

## 2012-06-24 MED ORDER — ZIPRASIDONE MESYLATE 20 MG IM SOLR
20.0000 mg | Freq: Once | INTRAMUSCULAR | Status: AC
  Administered 2012-06-24: 20 mg via INTRAMUSCULAR
  Filled 2012-06-24: qty 20

## 2012-06-24 MED ORDER — LORAZEPAM 2 MG/ML IJ SOLN
1.0000 mg | Freq: Once | INTRAMUSCULAR | Status: AC
  Administered 2012-06-24: 1 mg via INTRAVENOUS
  Filled 2012-06-24: qty 1

## 2012-06-24 MED ORDER — DEXTROSE-NACL 5-0.45 % IV SOLN: INTRAVENOUS | Status: DC

## 2012-06-24 MED ORDER — TETANUS-DIPHTH-ACELL PERTUSSIS 5-2.5-18.5 LF-MCG/0.5 IM SUSP: 0.5000 mL | Freq: Once | INTRAMUSCULAR | Status: DC

## 2012-06-24 MED ORDER — LORAZEPAM 2 MG/ML IJ SOLN: 1.0000 mg | Freq: Once | INTRAMUSCULAR | Status: DC

## 2012-06-24 MED ORDER — ZIPRASIDONE MESYLATE 20 MG IM SOLR: 20.0000 mg | Freq: Once | INTRAMUSCULAR | Status: DC

## 2012-06-24 NOTE — Progress Notes (Signed)
Offered support to patient's caregivers. Patient is their niece. The caregivers are also here at [facility] ED with their mother/mother-in-law. She is in #3. They are going back and forth between the two rooms.  Support; presence.

## 2012-06-24 NOTE — Progress Notes (Signed)
[facility] ED CM noted cm consult for home services Spoke with pt and female at bedside Pt noted to be asleep at intervals in the assessment Pt will need to be re assessed for clarity.  Pt did mumble yes when CM inquired if [facility] still the [facility] of her choice upon d/c After 5 pm Advanced coordinator needs notification on 06/25/12 Female states pt to be transferred to [facility] and not stay at [facility]  Choices provided, List given to female at bedside  HOME HEALTH AGENCIES SERVING [place]   Agencies that are Medicare-Certified and are affiliated with The [facility] Agency  Telephone Number Address  [facility]   The [facility] has ownership interest in this company; however, you are under no obligation to use this agency. [phone] or  [phone] [address] [place], [place] [address] [email or web address]   Agencies that are Medicare-Certified and are not affiliated with [facility] Agency Telephone Number Address  [facility] [phone] Fax [phone] [address], [address] [place], [place]  [address] [email or web address]  [facility] [phone] or [phone] Fax [phone] [address] [place], [place] [address] [email or web address]  [facility] [phone] Fax [phone] [address] [place], [place] [address] [email or web address]  [facility] [phone] Fax [phone] [address] [place], [place]  [address] [email or web address]  [facility] [phone] Fax [phone] [address] Suite [place], [place] [address] [email or web address]  [facility] [phone] [address] [place], [place]  [address] [email or web address]  [facility] [phone]  [address] [place], [place] [address] [email or web address]  [facility] [phone] or [phone] Fax number [phone] [address] [place], [place]  [address] [email or web address]  [facility] [phone] Fax [phone] [address] [place], [place]  [address]  [facility]  [phone] Fax [phone] [address] [place], [place] [address] [email or web address]

## 2012-06-24 NOTE — ED Notes (Signed)
Notified Dr Arizona Constable that CT was unable to be completed due to pt inability to lay on the table. Notified CT of Ativan administration and to try scan again in 15 min

## 2012-06-24 NOTE — ED Notes (Signed)
Per [employer] pt was at [facility] and had an outbreak and was unable to go into the office.brought to ER under IVC papers with [facility] escort
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
## 2012-06-24 NOTE — ED Notes (Signed)
Paged [facility] per Dr Denton Lank request to see if bed request could be changed to telemetry due to no step down beds being available. Awaiting return call.

## 2012-06-24 NOTE — ED Notes (Signed)
Sonya RN to return call from [facility] [id number]

## 2012-06-24 NOTE — ED Notes (Signed)
Spoke with Jill Alexanders at PD and he will send officer to follow with [facility], he will dispatch office out.  Spoke with Brook at [employer] per [place] return call when pd arrives

## 2012-06-25 ENCOUNTER — Observation Stay (HOSPITAL_COMMUNITY): Payer: Medicaid Other

## 2012-06-25 DIAGNOSIS — F200 Paranoid schizophrenia: Principal | ICD-10-CM | POA: Diagnosis present

## 2012-06-25 LAB — URINE MICROSCOPIC-ADD ON

## 2012-06-25 LAB — RETICULOCYTES: Retic Count, Absolute: 45.5 10*3/uL (ref 19.0–186.0)

## 2012-06-25 LAB — CBC
HCT: 28.8 % — ABNORMAL LOW (ref 36.0–46.0)
Hemoglobin: 10.5 g/dL — ABNORMAL LOW (ref 12.0–15.0)
RDW: 15.8 % — ABNORMAL HIGH (ref 11.5–15.5)
WBC: 5.6 10*3/uL (ref 4.0–10.5)

## 2012-06-25 LAB — MAGNESIUM: Magnesium: 2.1 mg/dL (ref 1.5–2.5)

## 2012-06-25 LAB — URINALYSIS, ROUTINE W REFLEX MICROSCOPIC
Glucose, UA: NEGATIVE mg/dL
Ketones, ur: 15 mg/dL — AB
pH: 5.5 (ref 5.0–8.0)

## 2012-06-25 LAB — COMPREHENSIVE METABOLIC PANEL
Alkaline Phosphatase: 169 U/L — ABNORMAL HIGH (ref 39–117)
BUN: 8 mg/dL (ref 6–23)
Calcium: 8.2 mg/dL — ABNORMAL LOW (ref 8.4–10.5)
GFR calc Af Amer: >90 mL/min (ref >90)
Glucose, Bld: 91 mg/dL (ref 70–99)
Total Protein: 7.4 g/dL (ref 6.0–8.3)

## 2012-06-25 LAB — TROPONIN I: Troponin I: <0.3 ng/mL (ref <0.30)

## 2012-06-25 LAB — RAPID URINE DRUG SCREEN, HOSP PERFORMED
Amphetamines: NOT DETECTED
Barbiturates: NOT DETECTED
Benzodiazepines: NOT DETECTED

## 2012-06-25 MED ORDER — FLUTICASONE PROPIONATE HFA 44 MCG/ACT IN AERO
1.0000 | INHALATION_SPRAY | Freq: Two times a day (BID) | RESPIRATORY_TRACT | Status: DC
  Administered 2012-06-25: 1 via RESPIRATORY_TRACT
  Filled 2012-06-25: qty 10.6

## 2012-06-25 MED ORDER — BENZTROPINE MESYLATE 0.5 MG PO TABS
0.5000 mg | ORAL_TABLET | Freq: Two times a day (BID) | ORAL | Status: DC | PRN
  Filled 2012-06-25: qty 1

## 2012-06-25 MED ORDER — FERROUS SULFATE 325 (65 FE) MG PO TABS
325.0000 mg | ORAL_TABLET | Freq: Two times a day (BID) | ORAL | Status: DC
  Administered 2012-06-25 – 2012-06-26 (×4): 325 mg via ORAL
  Filled 2012-06-25 (×8): qty 1

## 2012-06-25 MED ORDER — ONDANSETRON HCL 4 MG PO TABS: 4.0000 mg | ORAL_TABLET | Freq: Four times a day (QID) | ORAL | Status: DC | PRN

## 2012-06-25 MED ORDER — ACETAMINOPHEN 650 MG RE SUPP: 650.0000 mg | Freq: Four times a day (QID) | RECTAL | Status: DC | PRN

## 2012-06-25 MED ORDER — CHOLECALCIFEROL 50 MCG (2000 UT) PO TBDP: 1.0000 | ORAL_TABLET | Freq: Every day | ORAL | Status: DC

## 2012-06-25 MED ORDER — BUDESONIDE-FORMOTEROL FUMARATE 160-4.5 MCG/ACT IN AERO
2.0000 | INHALATION_SPRAY | Freq: Two times a day (BID) | RESPIRATORY_TRACT | Status: DC
  Administered 2012-06-25 – 2012-06-27 (×4): 2 via RESPIRATORY_TRACT
  Filled 2012-06-25: qty 6

## 2012-06-25 MED ORDER — SODIUM CHLORIDE 0.9 % IJ SOLN: 3.0000 mL | INTRAMUSCULAR | Status: DC | PRN

## 2012-06-25 MED ORDER — ALBUTEROL SULFATE HFA 108 (90 BASE) MCG/ACT IN AERS
2.0000 | INHALATION_SPRAY | RESPIRATORY_TRACT | Status: DC | PRN
  Administered 2012-06-27: 2 via RESPIRATORY_TRACT
  Filled 2012-06-25: qty 6.7

## 2012-06-25 MED ORDER — LORATADINE 10 MG PO TABS
10.0000 mg | ORAL_TABLET | Freq: Every day | ORAL | Status: DC
  Administered 2012-06-25 – 2012-06-26 (×2): 10 mg via ORAL
  Filled 2012-06-25 (×3): qty 1

## 2012-06-25 MED ORDER — LACOSAMIDE 50 MG PO TABS
150.0000 mg | ORAL_TABLET | Freq: Two times a day (BID) | ORAL | Status: DC
  Administered 2012-06-25 – 2012-06-26 (×3): 150 mg via ORAL
  Filled 2012-06-25: qty 3
  Filled 2012-06-25: qty 1
  Filled 2012-06-25 (×4): qty 3

## 2012-06-25 MED ORDER — HEPARIN SODIUM (PORCINE) 5000 UNIT/ML IJ SOLN
5000.0000 [IU] | Freq: Three times a day (TID) | INTRAMUSCULAR | Status: DC
  Filled 2012-06-25 (×4): qty 1

## 2012-06-25 MED ORDER — LEVETIRACETAM 500 MG PO TABS
500.0000 mg | ORAL_TABLET | Freq: Two times a day (BID) | ORAL | Status: DC
  Administered 2012-06-25 – 2012-06-26 (×3): 500 mg via ORAL
  Filled 2012-06-25 (×7): qty 1

## 2012-06-25 MED ORDER — CHOLESTYRAMINE LIGHT 4 G PO PACK
4.0000 g | PACK | Freq: Two times a day (BID) | ORAL | Status: DC
  Administered 2012-06-25 – 2012-06-26 (×3): 4 g via ORAL
  Filled 2012-06-25 (×7): qty 1

## 2012-06-25 MED ORDER — VITAMIN D3 25 MCG (1000 UNIT) PO TABS
2000.0000 [IU] | ORAL_TABLET | Freq: Every day | ORAL | Status: DC
  Administered 2012-06-25 – 2012-06-26 (×2): 2000 [IU] via ORAL
  Filled 2012-06-25 (×3): qty 2

## 2012-06-25 MED ORDER — WHITE PETROLATUM GEL
Status: AC
  Filled 2012-06-25: qty 5

## 2012-06-25 MED ORDER — URSODIOL 250 MG PO TABS: 500.0000 mg | ORAL_TABLET | Freq: Two times a day (BID) | ORAL | Status: DC

## 2012-06-25 MED ORDER — POTASSIUM CHLORIDE CRYS ER 20 MEQ PO TBCR
20.0000 meq | EXTENDED_RELEASE_TABLET | Freq: Two times a day (BID) | ORAL | Status: AC
  Administered 2012-06-25 (×2): 20 meq via ORAL
  Filled 2012-06-25 (×4): qty 1

## 2012-06-25 MED ORDER — ONDANSETRON HCL 4 MG/2ML IJ SOLN: 4.0000 mg | Freq: Four times a day (QID) | INTRAMUSCULAR | Status: DC | PRN

## 2012-06-25 MED ORDER — URSODIOL 300 MG PO CAPS
600.0000 mg | ORAL_CAPSULE | Freq: Two times a day (BID) | ORAL | Status: DC
  Administered 2012-06-25 – 2012-06-26 (×3): 600 mg via ORAL
  Filled 2012-06-25 (×7): qty 2

## 2012-06-25 MED ORDER — ALBUTEROL 90 MCG/ACT IN AERS: 2.0000 | INHALATION_SPRAY | RESPIRATORY_TRACT | Status: DC | PRN

## 2012-06-25 MED ORDER — CAMPHOR-MENTHOL 0.5-0.5 % EX LOTN
TOPICAL_LOTION | CUTANEOUS | Status: DC | PRN
  Filled 2012-06-25: qty 222

## 2012-06-25 MED ORDER — LURASIDONE HCL 40 MG PO TABS
40.0000 mg | ORAL_TABLET | Freq: Every day | ORAL | Status: DC
  Administered 2012-06-25 – 2012-06-26 (×2): 40 mg via ORAL
  Filled 2012-06-25 (×6): qty 1

## 2012-06-25 MED ORDER — PANTOPRAZOLE SODIUM 40 MG PO TBEC
40.0000 mg | DELAYED_RELEASE_TABLET | Freq: Every day | ORAL | Status: DC
  Administered 2012-06-25 – 2012-06-26 (×2): 40 mg via ORAL
  Filled 2012-06-25 (×2): qty 1

## 2012-06-25 MED ORDER — SODIUM CHLORIDE 0.9 % IV SOLN: 250.0000 mL | INTRAVENOUS | Status: DC | PRN

## 2012-06-25 MED ORDER — ACETAMINOPHEN 325 MG PO TABS
650.0000 mg | ORAL_TABLET | Freq: Four times a day (QID) | ORAL | Status: DC | PRN
  Administered 2012-06-27: 325 mg via ORAL
  Filled 2012-06-25 (×3): qty 2

## 2012-06-25 MED ORDER — SODIUM CHLORIDE 0.9 % IJ SOLN
3.0000 mL | Freq: Two times a day (BID) | INTRAMUSCULAR | Status: DC
  Administered 2012-06-25 (×2): 3 mL via INTRAVENOUS

## 2012-06-25 MED ORDER — MONTELUKAST SODIUM 10 MG PO TABS
10.0000 mg | ORAL_TABLET | Freq: Every day | ORAL | Status: DC
Start: 2012-06-25 — End: 2012-06-27
  Administered 2012-06-25 – 2012-06-26 (×2): 10 mg via ORAL
  Filled 2012-06-25 (×3): qty 1

## 2012-06-25 NOTE — Progress Notes (Signed)
Chaplain visited patient after receiving a request from her nurse Diane. Patient was awake, responsive but appeared to be unstable in her cognitive state of mind. Patient's sitter Harriett Sine was present the entire period of Chaplain's visit. Patient expressed concern about sin in the world and the suffering of Jesus Christ. Patient expressed strong believe in God and appeared to be very religious. Chaplain provided a copy of a New Testament with the Psalms to patient in response to her request. Patient stated shouting and got agitated in the middle of Chaplain's prayer. Chaplain and sitter calmed patient down and everything came back to normal. Chaplain provided ministry of presence, empathic listening, encouraged and shared words of hope with patient. Chaplain will follow-up as needed. Patient, her sitter and nurse expressed their appreciation for Chaplain's visit and spiritual support.

## 2012-06-25 NOTE — Progress Notes (Signed)
[facility]  Patient Status: Active (receiving services up to time of hospitalization)  [facility] is providing the following services: RN  If patient discharges after hours, please call [phone].   [name] 06/25/2012, 1:18 PM
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
## 2012-06-25 NOTE — Progress Notes (Signed)
Patient requested spiritual consult. Requested own minister but agreed to see hospital chaplain. Chaplain called and to see patient this afternoon.  Peter Congo RN

## 2012-06-25 NOTE — Progress Notes (Signed)
Family Medicine Teaching Service Daily Progress Note Service Page: [phone]  Subjective:  Feels good this morning, enjoying breakfast. Denies SI/HI, and visual or auditory hallucinations Having intermittent central chest pain relieved by her inhaler, unable to characterize further  Objective: Temp:  [97.6 F (36.4 C)-99.5 F (37.5 C)] 99.5 F (37.5 C) (01/29 0629) Pulse Rate:  [100-111] 106  (01/29 0629) Resp:  [16-20] 20  (01/29 0629) BP: (106-159)/(58-106) 122/79 mmHg (01/29 0629) SpO2:  [98 %-100 %] 100 % (01/29 0629) Weight:  [143 lb 4.8 oz (65 kg)] 143 lb 4.8 oz (65 kg) (01/28 2225) Exam: Gen: NAD, alert, cooperative with exam, calm HEENT: NCAT CV: RRR, good S1/S2, 3/6 systolic murmur Resp: CTABL, no wheezes, non-labored Abd: Soft, mild tenderness in RUQ, no murphy sign, BS present, no guarding or organomegaly, liver not palpable Ext: No edema, warm Neuro: Alert and oriented X 3, Strength 5/5 throughout, sensatsion intact in all 4 extremities   I have reviewed the patient's medications, labs, imaging, and diagnostic testing.  Notable results are summarized below.  CBC BMET   Lab 06/25/12 0455 06/24/12 1525  WBC 5.6 4.3  HGB 10.5* 11.9*  HCT 28.8* 34.8*  PLT 75* 88*    Lab 06/25/12 0455 06/24/12 1525  NA 144 140  K 3.4* 3.1*  CL 109 104  CO2 21 18*  BUN 8 10  CREATININE 0.35* 0.53  GLUCOSE 91 107*  CALCIUM 8.2* 8.5     Imaging/Diagnostic Tests: CT Head W/o 06/24/2012 IMPRESSION:  1. No acute intracranial abnormalities.  2. Physiologic calcifications in the basal ganglia bilaterally.  DG hand R 06/24/2012 IMPRESSION:  Negative.  Plan: 86 YOF with a history of somatization disorder/mental retardation, primary biliary cirrhosis, epilepsy who presents with increased agitation, confusion, and hallucinations over the past 3-4 days.   Altered mental status with confusion, agitation, and hallucinations.  - CT head negative for acute process.  - UDS, tylenol,  alcohol salicylate levels normal.  - D/Dx: Schizophrenia, delirium, infectious etiology, metabolic encephalopathy - Sitter at bedside  - UA with trace LE, and mod bili, WBC 5.6 - ammonia 23 - Psychiatry consulted, recommendations appreciated  Metabolic acidosis (low bicarb on BMET).  - Lactic acid WNL, resolved this AM  Chest pain -  - Likely respiratory related, limited by patient's ability to give history - Monitor  History of epilepsy  - Continue home Vimpat, Keppra   Psych - History of mental retardation  - History of somatization disorder   Allergic rhinitis, asthma  -Stable  - Continue home albuterol, QVAR   Anemia  - Hgb down to 10.5 from 11.1 on admission - Monitor  Thrombocytopenia. - 88 on admission, 114 02/2012, 150-180s a year ago. - 75 this am  - Ursodiol causes thrombocytopenia in 1%.  - Peripheral smear  - Will follow  History of primary biliary cirrhosis  - LFTs appear stable,  INR 1.79 - Continue home ursodiol   FEN/GI: Hypokalemia, Replete. Mag 2.1 DISPO: once clinically stable   -Her uncle knows her well. His cell phone number is [phone]. He felt like before this, she did very well living by herself at home. He lives next door.   -She already gets home health.   Kevin Fenton, MD 06/25/2012, 7:18 AM

## 2012-06-25 NOTE — Progress Notes (Signed)
FMTS Attending Daily Note:  Renold Don MD  [phone] pager  Family Practice pager:  [phone] I have discussed this patient with the resident Dr. Ermalinda Memos and attending physician Dr. [name] Kettle.  I agree with their findings, assessment, and care plan

## 2012-06-25 NOTE — Progress Notes (Signed)
Resp Care Note; Pt refused to allow me to draw blood for an ABG.RN aware.

## 2012-06-25 NOTE — H&P (Signed)
FMTS Attending Admission Note: Denny Levy MD [phone] pager office [phone] I  have seen and examined this patient, reviewed their chart. I have discussed this patient with the resident. I agree with the resident's findings, assessment and care plan. She is currently medically stable for transfer to [facility].

## 2012-06-25 NOTE — Consult Note (Signed)
Patient Identification:  [name] Date of Evaluation:  06/25/2012 Reason for Consult:Schizophrenia, Paranoid type  Referring Provider: Dr. Ermalinda Memos  History of Present Illness:Pt demonstrated extreme agitation, confusion and hallucinations over the past 3-4 week.  She had question of abnormal bicarbonate and metabolic questions.  She has a history of seizures; no seizures were reported.   Past Psychiatric History:None.  She has no inpt. Psychiatric history.  She is considered'challenged'.    Past Medical History:     Past Medical History  Diagnosis Date  . Allergy   . Anemia   . Seizures   . Intellectual disability   . Constipation   . Asthma   . Abnormal vaginal bleeding   . Biliary cirrhosis 04/26/11  . 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 DUB (dysfunctional uterine bleeding)   . PONV (postoperative nausea and vomiting)        Past Surgical History  Procedure Date  . Appendectomy   . Cholecystectomy   . Eye muscle surgery   . Eustacean tubes   . Colonoscopy w/ biopsies   . Adenonoidectomy     Allergies:  Allergies  Allergen Reactions  . Aspirin Nausea And Vomiting    Due to Stomach ulcer  . Latex Itching  . Penicillins Swelling  . Pseudoephedrine Swelling    Tongue swelling  . Sulfa Antibiotics Nausea And Vomiting  . Tramadol Hcl Itching    REACTION: seizures    Current Medications:  Prior to Admission medications   Medication Sig Start Date End Date Taking? Authorizing Provider  albuterol (PROVENTIL,VENTOLIN) 90 MCG/ACT inhaler Inhale 2 puffs into the lungs every 4 (four) hours as needed. For wheezing   Yes Historical Provider, MD  ammonium lactate (LAC-HYDRIN) 12 % cream Apply topically as needed for dry skin. 06/18/12  Yes Lonia Skinner, MD  Azelastine HCl (ASTEPRO) 0.15 % SOLN Place 2 sprays into the nose daily.    Yes Historical Provider, MD  beclomethasone (QVAR) 80 MCG/ACT inhaler Inhale 2 puffs into the lungs 2 (two) times daily.   Yes  Historical Provider, MD  benzonatate (TESSALON PERLES) 100 MG capsule Take 1 capsule (100 mg total) by mouth 3 (three) times daily as needed for cough. 05/02/12  Yes Lonia Skinner, MD  budesonide-formoterol (SYMBICORT) 160-4.5 MCG/ACT inhaler Inhale 2 puffs into the lungs 2 (two) times daily.     Yes Historical Provider, MD  camphor-menthol Wynelle Fanny) lotion Apply topically as needed for itching. 06/18/12  Yes Lonia Skinner, MD  cetirizine (ZYRTEC) 10 MG tablet Take 1 tablet (10 mg total) by mouth every morning. 11/01/11  Yes Rodolph Bong, MD  cholestyramine light (PREVALITE) 4 G packet Take 8 g by mouth 2 (two) times daily.   Yes Historical Provider, MD  dextromethorphan (DELSYM) 30 MG/5ML liquid Take 5 mLs (30 mg total) by mouth every 8 (eight) hours as needed for cough. 05/01/12  Yes Lonia Skinner, MD  ferrous sulfate 325 (65 FE) MG tablet Take 1 tablet (325 mg total) by mouth 2 (two) times daily with a meal. 03/07/12  Yes Lonia Skinner, MD  Lacosamide 100 MG TABS Take 1.5 tablets by mouth 2 (two) times daily.   Yes Historical Provider, MD  levETIRAcetam (KEPPRA) 500 MG tablet Take 500 mg by mouth every 12 (twelve) hours.   Yes Gillie Manners, MD  montelukast (SINGULAIR) 10 MG tablet Take 1 tablet (10 mg total) by mouth daily. 11/01/11  Yes Rodolph Bong, MD  Multiple Vitamin (MULTIVITAMIN) tablet Take  1 tablet by mouth daily.   Yes Historical Provider, MD  omeprazole (PRILOSEC) 40 MG capsule Take 40 mg by mouth daily.   Yes Historical Provider, MD  ursodiol (ACTIGALL) 250 MG tablet Take 2 tablets (500 mg total) by mouth 2 (two) times daily. 11/02/11  Yes Rodolph Bong, MD  VITAMIN D, CHOLECALCIFEROL, PO Take 1 tablet by mouth daily.   Yes Historical Provider, MD    Social History:    reports that she has never smoked. She has never used smokeless tobacco. She reports that she does not drink alcohol or use illicit drugs.   Family History:    Family History  Problem Relation Age of Onset  .  Diabetes Mother   . Heart disease Mother   . COPD Mother     Mental Status Examination/Evaluation: Objective:  Appearance: Casual and calm with abrupt switch to paranoid accusations and anger.  Sitter had to be called  Eye Contact::  Good  Speech:  poor enunciation but understood  Volume:  Normal  Mood:  fluctuates  Affect:  Blunt and Labile  Thought Process:  Coherent and Loose  Orientation:  Other:  oriented to person place and time   Thought Content:  Paranoid Ideation  Suicidal Thoughts:  No  Homicidal Thoughts:  No  Judgement:  Impaired  Insight:  Fair   DIAGNOSIS:   AXIS I   Borderline Intellectual functioning  AXIS II  Deferred  AXIS III See medical notes.  AXIS IV economic problems, educational problems, occupational problems, other psychosocial or environmental problems, problems related to social environment, problems with primary support group and newly emerging [according to uncle] of paranoid behavior with delusions of '[special powers'  AXIS V 51-60 moderate symptoms   Assessment/Plan:  Discussed with Dr. Ermalinda Memos Pt is awake, alert and engages in conversation as Sitter leaves.  She has a problem with articulation of words but is understood.  She gives history of living with large family of grandmother, grandfather and aunts, uncles and cousins.  She says the cousins were jealous of things she was given.  She cries, strong affect, when grandmother's death is mentioned.  Sh speaks about going to church.  She then says she has 'special poewers' to know things about people.  She says she lives with all family members living in homes around her; (uncle Bing Ree, lives across the street and is contacted for corroboration of history.)    /before leaving room pt. Burst into a paranoid Flare.  She accused MD of stealing her personal documents as the MD closed a notebook.  She was yelling and lapsed into a challenge of religious references of repentance.   A call to her uncle  Dorene Sorrow says that he had taken her to the ED because he was slowly seeing symptoms that she claimed people were sabotaging her efforts, they were stealing from her and people would call disguizing their name.  When he called her she would not recognize his voice.  He also says that pt has an aunt who calls her very early in am, if not while sleeping [2 am].  He looked at pt's cell phone and Loraine Grip had called her ~ 6 times in half an hour.  He says pt completed the 8th grade and doctors told family she was "challenged".  Comment about Aunt begs the question of family mental illness as well.  RECOMMENDATION:  1.  Pt demonstrates paranoid delusions and belief of possessing special powers, strongly suggesting Schizophrenia, paranoid  type.  2. Suggest - keep sitter for pt and staff safety. 3.  Suggest begin Latuda 40 mg daily 4.  Monitor for EPS; if any give Cogentin 0.5 mg twice daily. 5.  Suggest transfer to [facility] or comparable facility if pt can follow ADLs and participate in meds and daily requests.  6.  Will follow pt.  Mathayus Stanbery MD 06/25/2012 1:08 PM

## 2012-06-25 NOTE — Care Management Note (Signed)
    Page 1 of 1   06/26/2012     3:18:40 PM   CARE MANAGEMENT NOTE 06/26/2012  Patient:  [facility]   Account Number:  [id number]  Date Initiated:  06/25/2012  Documentation initiated by:  Letha Cape  Subjective/Objective Assessment:   dx acute delirium  admit- lives alone, but uncle and aunt lives next door. Active with [facility].     Action/Plan:   physch eval   Anticipated DC Date:  06/26/2012   Anticipated DC Plan:  PSYCHIATRIC HOSPITAL  In-house referral  Clinical Social Worker      DC Planning Services  CM consult      Choice offered to / List presented to:             Status of service:  Completed, signed off Medicare Important Message given?   (If response is "NO", the following Medicare IM given date fields will be blank) Date Medicare IM given:   Date Additional Medicare IM given:    Discharge Disposition:  PSYCHIATRIC HOSPITAL  Per UR Regulation:  Reviewed for med. necessity/level of care/duration of stay  If discussed at Long Length of Stay Meetings, dates discussed:    Comments:  06/26/12 15:16 Letha Cape RN, BSN [phone] patient is for discharge to phsych facility  [facility] today, physch  CSW follwoing.  06/25/12 12:31 Letha Cape RN, BSN [phone] patient lives alone, but uncle and aunt lives next door, patient is active with [facility], notified Lupita Leash with [facility] that patient is here.  Patient is for Physch eval.  NCM will continue to follow for dc needs.

## 2012-06-26 LAB — CBC
Hemoglobin: 10.9 g/dL — ABNORMAL LOW (ref 12.0–15.0)
MCH: 29.4 pg (ref 26.0–34.0)
Platelets: 84 10*3/uL — ABNORMAL LOW (ref 150–400)
RBC: 3.71 MIL/uL — ABNORMAL LOW (ref 3.87–5.11)
WBC: 5.1 10*3/uL (ref 4.0–10.5)

## 2012-06-26 LAB — COMPREHENSIVE METABOLIC PANEL
Albumin: 3 g/dL — ABNORMAL LOW (ref 3.5–5.2)
Alkaline Phosphatase: 172 U/L — ABNORMAL HIGH (ref 39–117)
BUN: 6 mg/dL (ref 6–23)
Calcium: 8.4 mg/dL (ref 8.4–10.5)
GFR calc Af Amer: >90 mL/min (ref >90)
Glucose, Bld: 107 mg/dL — ABNORMAL HIGH (ref 70–99)
Potassium: 3.7 mEq/L (ref 3.5–5.1)
Sodium: 139 mEq/L (ref 135–145)
Total Protein: 7.7 g/dL (ref 6.0–8.3)

## 2012-06-26 LAB — HAPTOGLOBIN: Haptoglobin: <25 mg/dL — ABNORMAL LOW (ref 45–215)

## 2012-06-26 LAB — LACTATE DEHYDROGENASE: LDH: 245 U/L (ref 94–250)

## 2012-06-26 MED ORDER — BENZTROPINE MESYLATE 0.5 MG PO TABS: 0.5000 mg | ORAL_TABLET | Freq: Two times a day (BID) | ORAL | Status: DC | PRN

## 2012-06-26 MED ORDER — LURASIDONE HCL 40 MG PO TABS: 40.0000 mg | ORAL_TABLET | Freq: Every day | ORAL | Status: DC

## 2012-06-26 MED ORDER — URSODIOL 300 MG PO CAPS: 600.0000 mg | ORAL_CAPSULE | Freq: Two times a day (BID) | ORAL | Status: DC

## 2012-06-26 NOTE — Progress Notes (Signed)
Chaplain Note:  Chaplain visited with pt, who was resting in bed, awake, and alert.  Pt's sitter was at bedside.  Chaplain provided spiritual comfort, support, prayer, and scripture reading for pt.  Pt expressed appreciation for chaplain support. Chaplain will follow up as needed.  06/26/12 1400  Clinical Encounter Type  Visited With Patient;Health care provider  Visit Type Spiritual support  Referral From Patient  Spiritual Encounters  Spiritual Needs Emotional;Prayer  Stress Factors  Patient Stress Factors Health changes;Lack of caregivers;Loss of control  Family Stress Factors None identified (No family present)   Verdie Shire, Chaplain [phone]

## 2012-06-26 NOTE — Plan of Care (Signed)
Problem: Phase I Progression Outcomes Goal: Initial discharge plan identified Outcome: Completed/Met Date Met:  06/26/12 Inpatient pysch

## 2012-06-26 NOTE — Progress Notes (Signed)
[facility] Primary Care Provider Social Note:   I came to visit her today and she did not recognize me which is new for her. 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 Her mind wonders from one topic to another that are not related: going from her concern about her BP and blood tests to her relationship with God. She appears disheveled with a flat affect.   She has always been anxious about her medical condition and sometimes presented in the office with more somatic complaints, however this is very different and exaggerated from her previous baseline. I do not know of any event that prompted this psychotic break.   Thank you to the FPTS team and the psychiatry team for the outstanding care provided to my patient.   Marena Chancy, PGY-2 Family Medicine Resident

## 2012-06-26 NOTE — Progress Notes (Signed)
Clinical Social Work Progress Note PSYCHIATRY SERVICE LINE 06/26/2012  Patient:  [name]  Account:  [id number]  Admit Date:  06/24/2012  Clinical Social Worker:  Unk Lightning, LCSW  Date/Time:  06/26/2012 11:15 AM  Review of Patient  Overall Medical Condition:   Per chart review, patient medically stable to dc when psych bed available.   Participation Level:  Active  Participation Quality  Attentive   Other Participation Quality:   Affect  Labile   Cognitive  Alert   Reaction to Medications/Concerns:   Psych MD made Rx recommendations for Latuda and Cogentin   Modes of Intervention  Support  Clarification  Behaviors/Psychosis   Summary of Progress/Plan at Discharge   CSW met with patient at bedside. Patient laying in bed and just recently had a bath. Patient reports that she is feeling better today. CSW spoke with patient regarding mood and emotionally wellbeing. Patient reports that she feels safe and good today. Throughout the session patient's mood changed several times. Patient was calm and engaged at the beginning of the assessment. Patient became guarded when CSW asked about her behavior during psych MD assessment and chaplain visit. Patient became tearful and reports that others are telling lies about her. CSW allowed patient time to speak about how she feels others are spreading rumors about her. Patient was unable to report who was spreading rumors or what they were saying about her. CSW attempted to redirect patient but patient struggled with redirecting to a positive topic.    Patient unpredictable during assessment and unable to stay on topic for extended amounts of time. Patient struggles with reflecting on behaviors and unable to give details on previous encounters.    CSW spoke with uncle [name]) via phone. CSW explained psych MD recommendations for inpatient psych stay. Uncle agreeable to plan. CSW explained certain hospitals except patients with MR diagnosis.  [name] is unsure which psychiatrist diagnosed patient with MR but agreeable to bring in records tonight of tests completed. CSW explained CSW would complete search of hospitals and keep uncle updated on plans.    The following facilities were contacted regarding if they could accept patient:  [name]-cannot accept patient with MR diagnosis  [name] referral and spoke with Lori Asp at admissions  [name] with [name] who reports if all other hospitals refuse, patient can possibly be considered  [facility] with [name] who cannot accept Medicaid outside of [name]-referral sent and spoke with [name] accept patient with MR diagnosis  [name]-message left with admissions    CSW spoke with MD and updated on status. CSW will continue to follow.

## 2012-06-26 NOTE — Progress Notes (Signed)
Family Medicine Teaching Service Daily Progress Note Service Page: [phone]  Subjective:  Feels fine this am, Wants her paperwork at home.  Chest pain resolved, some off and on abd pain that's very mild Possibly seeing people that are not there, states "not real," but difficult to characterize further Some tremors, confirmed by sitter  Objective: Temp:  [98.5 F (36.9 C)-99.1 F (37.3 C)] 99 F (37.2 C) (01/30 0522) Pulse Rate:  [104-111] 107  (01/30 0522) Resp:  [20] 20  (01/30 0522) BP: (117-134)/(75-87) 134/87 mmHg (01/30 0522) SpO2:  [100 %] 100 % (01/30 0803) Exam: Gen: NAD, alert, cooperative with exam, calm HEENT: NCAT CV: RRR, good S1/S2, 3/6 systolic murmur Resp: CTABL, no wheezes, non-labored Abd: Soft, mild tenderness in RUQ, no murphy sign, BS present, no guarding or organomegaly, liver not palpable Ext: No edema, warm Neuro: Alert and oriented X 3, Strength 5/5 throughout, sensatsion intact in all 4 extremities, no tremors observed Psych: Denies, SI, possible visual hallucinations, Appropriate mood and affect   I have reviewed the patient's medications, labs, imaging, and diagnostic testing.  Notable results are summarized below.  CBC BMET   Lab 06/25/12 0455 06/24/12 1525  WBC 5.6 4.3  HGB 10.5* 11.9*  HCT 28.8* 34.8*  PLT 75* 88*    Lab 06/25/12 0455 06/24/12 1525  NA 144 140  K 3.4* 3.1*  CL 109 104  CO2 21 18*  BUN 8 10  CREATININE 0.35* 0.53  GLUCOSE 91 107*  CALCIUM 8.2* 8.5     Imaging/Diagnostic Tests: CT Head W/o 06/24/2012 IMPRESSION:  1. No acute intracranial abnormalities.  2. Physiologic calcifications in the basal ganglia bilaterally.  DG hand R 06/24/2012 IMPRESSION:  Negative.  CXR 06/25/2012 IMPRESSION:  No active cardiopulmonary process.  Plan: 32 YOF with a history of somatization disorder/mental retardation, primary biliary cirrhosis, epilepsy who presents with increased agitation, confusion, and hallucinations over the  past 3-4 days.   Altered mental status with confusion, agitation, and hallucinations- clearing- likely schizophrenia - CT head negative for acute process.  - UDS, tylenol, alcohol salicylate levels normal.  - Psychiatry consulting and feels findings consistent with paranoid schizophrenia- appreciate recommendations  - Latuda 40 mg qd  - PRN benztropine for EPS side effects, although not expected  - Psych placement if she can perform ADLs  - Will follow - Sitter at bedside  - Agree with above but will remain cognisent of her hepatic status and monitor for metabolic encephalopathy - No signs of infection currently - Also Hx of MR and somatization d/o - Monitor for EPS, some tremors, PRN benztropine  Metabolic acidosis (low bicarb on BMET).  - Lactic acid WNL, resolved this AM  Chest pain -  - Likely respiratory related, limited by patient's ability to give history - Monitor  History of epilepsy - no events - Continue home Vimpat, Keppra   Allergic rhinitis, asthma  -Stable  - Continue home albuterol, QVAR   Anemia  - Hgb down to 10.5 from 11.1 on admission, pending currently - Some schistocytes on smear, LDH and haptoglobin - Monitor  Thrombocytopenia. - 88 on admission, 114 02/2012, 150-180s a year ago, pending currently - 75 this am  - Ursodiol causes thrombocytopenia in 1%.  - Peripheral smear shows large platelets  - Will follow  History of primary biliary cirrhosis  - LFTs appear stable,  INR 1.79 - Ammonia 23 - Continue home ursodiol  - monitor  FEN/GI: Hypokalemia, Replete. Mag 2.1 DISPO: to [facility] when bed available,  appreciate psych Social Work's help.    -Her uncle knows her well. His cell phone number is [phone]. He felt like before this, she did very well living by herself at home. He lives next door.   -She already gets home health.   Kevin Fenton, MD 06/26/2012, 9:33 AM

## 2012-06-26 NOTE — Progress Notes (Signed)
Clinical Social Work  Patient has been accepted at [facility]. CSW called Sheriff for transport and Kathryne Sharper reports that they cannot transport patient until 06/27/12. CSW spoke with Chiropodist Wandra Mannan) who stated if patient is not combative then [facility] can wait until tomorrow. CSW spoke with Abran Cantor who is agreeable to hold bed until 06/27/12. Sheriff will be at hospital at 800 to transport. CSW made family, RN and MD aware of plans.  [place], [place] [phone]

## 2012-06-26 NOTE — Progress Notes (Signed)
FMTS Attending Daily Note:  Lori Don MD  [phone] pager  Family Practice pager:  [phone] I have seen and examined this patient and have reviewed their chart. I have discussed this patient with the resident. I agree with the resident's findings, assessment and care plan.  Additionally: - Appreciate psych input.  Agree likely schizophrenia, she is admitting to both visual and auditory hallucinations since admission.  None currently.  - Following platelets/anemia.  Follow LFTs with addition of antipsychotics - MELD score 18, chronic liver failure.  - No further CP - not cardiac in origin - DC IVF, eating well   - Pending placement.

## 2012-06-26 NOTE — Progress Notes (Signed)
Clinical Social Work  CSW received a call from [employer]) who asked more questions regarding patient's behaviors. [name] reports they will ask MD to review case and will CSW when determination has been made. CSW will continue to follow.  [place], [place] [phone]

## 2012-06-26 NOTE — Discharge Summary (Signed)
Family Medicine Teaching [facility] Discharge Summary  Patient name: [name] Medical record number: [id number] Date of birth: [birth year] Age: 37 y.o. Gender: female Date of Admission: 06/24/2012  Date of Discharge: 06/26/2012 Admitting Physician: Tobey Grim, MD  Primary Care Provider: Marena Chancy, MD  Indication for Hospitalization: Altered mental status Discharge Diagnoses:  1. Schizophrenia, Paranoid type 2. Chest pain 3. Metabolic acidosis 4. Epilepsy 5. Primary biliary cirrhosis 6. Allergic rhinitis 7. Asthma 8. Anemia 9. Thrombocytopenia  Consultations: Psychiatry  Significant Labs and Imaging:  Lab 06/26/12 1040 06/25/12 0455 06/24/12 1525  HGB 10.9* 10.5* 11.9*  HCT 30.7* 28.8* 34.8*  WBC 5.1 5.6 4.3  PLT 84* 75* 88*    Lab 06/24/12 1525  INR 1.79*     06/25/2012 04:55 06/26/2012 10:40  Sodium 144 139  Potassium 3.4 (L) 3.7  Chloride 109 107  CO2 21 23  BUN 8 6  Creatinine 0.35 (L) 0.34 (L)  Calcium 8.2 (L) 8.4  GFR calc non Af Amer >90 >90  GFR calc Af Amer >90 >90  Glucose 91 107 (H)  Magnesium 2.1   Alkaline Phosphatase 169 (H) 172 (H)  Albumin 2.9 (L) 3.0 (L)  AST 175 (H) 169 (H)  ALT 133 (H) 139 (H)  Total Protein 7.4 7.7  Ammonia    Total Bilirubin 4.3 (H) 4.1 (H)   Ammonia 23   06/25/2012 03:00  AMPHETAMINES NONE DETECTED  Barbiturates NONE DETECTED  Benzodiazepines NONE DETECTED  Opiates NONE DETECTED  COCAINE NONE DETECTED  Tetrahydrocannabinol NONE DETECTED     06/25/2012 03:02  Color, Urine AMBER (A)  APPearance CLOUDY (A)  Specific Gravity, Urine 1.013  pH 5.5  Glucose NEGATIVE  Bilirubin Urine MODERATE (A)  Ketones, ur 15 (A)  Protein NEGATIVE  Urobilinogen, UA 0.2  Nitrite NEGATIVE  Leukocytes, UA TRACE (A)  Hgb urine dipstick NEGATIVE  WBC, UA 0-2  Squamous Epithelial / LPF MANY (A)  Bacteria, UA FEW (A)  Casts GRANULAR CAST (A)   CT Head W/o 06/24/2012  IMPRESSION:  1. No acute intracranial  abnormalities.  2. Physiologic calcifications in the basal ganglia bilaterally.   DG hand R 06/24/2012  IMPRESSION:  Negative.   CXR 06/25/2012  IMPRESSION:  No active cardiopulmonary process.  Procedures: None  Brief Hospital Course:  89 YOF with a history of somatization disorder/mental retardation, primary biliary cirrhosis, epilepsy who presents with increased agitation, confusion, and hallucinations over the past 3-4 days. Notably she arrived at our clinic, refused to enter, did not recognize her long-tie physicians, and reacted violently to questioning. She was sent to the ED for psychiatric evaluation dn admitted to the hospital for medical evaluation.   Altered mental status with confusion, agitation, and hallucinations  She was brought to our clinic by her uncle because of a 3-4 day history of agitation, confusion, and hallucinations. He brought her to the clinic to investigate possible medication effect when she became irate and violent after refusing to go into the clinic. She was sent to the ED for evaluation and admitted to our service out of concern for metabolic acidosis.   A Head CT was obtained that was negative for acute processes. Further, a UDS, ethanol level, tylenol level, salycilate, and ammonia levels were obtained and non-contributory. Psychiatry was consulted who felt that she demonstrated paranoid delusions and belief of special powers suggestive of paranoid schizophrenia. Per psychiatry's recommendations we started her on latuda and sought psychiatric placement. We kept her with a sitter by her bedside  24 hours a day. She did not have a leukocytosis and a CXR was obtained without acute findings.   Notably she also has a history of mental retardation and somatization disorder.   Metabolic acidosis (low bicarb on BMET).  Her HCO3 was slightly low on admission to 18 without good explanation. Her lactic acid level was WNL and it resolved the day after admission.   Chest  pain She complained of some off and on chest pain helped by her inhaler. Because of difficulty assessing her because of her cognitive deficits we obtained an EKG and a troponin to rule out ACS, Her pain was relieved the following day.   History of epilepsy  No events during admission. We continued her home Vimpat and Keppra.   Allergic rhinitis, asthma  We continued her home albuterol and Qvar without event while admitted.   Anemia  He hgb was stable around 11 throughout admission, which is her baseline. She had 5-7 shistocytes/HPF so an LDH was obtained which were WNL to confirm she did not have active hemolytic anemia. A haptoglobin level was still pending.   Thrombocytopenia.  Her plataelets were down to 88k on admission with a baseline of 150-180 in the past. They were stable throughout admission and a peripheral smear was obtained showing large platelets.  History of primary biliary cirrhosis  Her LFTs were stable throughout admission, her bilirubin was elevated to 4.1, and her ammonia is WNL, and her ursodiol was continued.   Dispo: [facility]   Discharge Medications:    Medication List     As of 06/26/2012  2:43 PM    STOP taking these medications         ursodiol 250 MG tablet   Commonly known as: ACTIGALL      TAKE these medications         albuterol 90 MCG/ACT inhaler   Commonly known as: PROVENTIL,VENTOLIN   Inhale 2 puffs into the lungs every 4 (four) hours as needed. For wheezing      ammonium lactate 12 % cream   Commonly known as: AMLACTIN   Apply topically as needed for dry skin.      ASTEPRO 0.15 % Soln   Generic drug: Azelastine HCl   Place 2 sprays into the nose daily.      beclomethasone 80 MCG/ACT inhaler   Commonly known as: QVAR   Inhale 2 puffs into the lungs 2 (two) times daily.      benzonatate 100 MG capsule   Commonly known as: TESSALON   Take 1 capsule (100 mg total) by mouth 3 (three) times daily as needed for cough.       benztropine 0.5 MG tablet   Commonly known as: COGENTIN   Take 1 tablet (0.5 mg total) by mouth 2 (two) times daily as needed.      budesonide-formoterol 160-4.5 MCG/ACT inhaler   Commonly known as: SYMBICORT   Inhale 2 puffs into the lungs 2 (two) times daily.      camphor-menthol lotion   Commonly known as: SARNA   Apply topically as needed for itching.      cetirizine 10 MG tablet   Commonly known as: ZYRTEC   Take 1 tablet (10 mg total) by mouth every morning.      cholestyramine light 4 G packet   Commonly known as: PREVALITE   Take 8 g by mouth 2 (two) times daily.      dextromethorphan 30 MG/5ML liquid   Commonly known as:  DELSYM   Take 5 mLs (30 mg total) by mouth every 8 (eight) hours as needed for cough.      ferrous sulfate 325 (65 FE) MG tablet   Take 1 tablet (325 mg total) by mouth 2 (two) times daily with a meal.      Lacosamide 100 MG Tabs   Take 1.5 tablets by mouth 2 (two) times daily.      levETIRAcetam 500 MG tablet   Commonly known as: KEPPRA   Take 500 mg by mouth every 12 (twelve) hours.      lurasidone 40 MG Tabs   Commonly known as: LATUDA   Take 1 tablet (40 mg total) by mouth daily with breakfast.      montelukast 10 MG tablet   Commonly known as: SINGULAIR   Take 1 tablet (10 mg total) by mouth daily.      multivitamin tablet   Take 1 tablet by mouth daily.      omeprazole 40 MG capsule   Commonly known as: PRILOSEC   Take 40 mg by mouth daily.      ursodiol 300 MG capsule   Commonly known as: ACTIGALL   Take 2 capsules (600 mg total) by mouth 2 (two) times daily.      VITAMIN D (CHOLECALCIFEROL) PO   Take 1 tablet by mouth daily.         Issues for Follow Up:  - Monitoring parameters for starting Latuda: Fasiting lipid, A1C, BMI, Orthostatic hypotension (for first 3-5 days),  - Trend LFTs as she has cirrhosis and this medication should be used cautiously in liver impairment - Trend platelets as her were low here but stable.    Outstanding Results: Haptoglobin  Discharge Instructions: Please refer to Patient Instructions section of EMR for full details.  Patient was counseled important signs and symptoms that should prompt return to medical care, changes in medications, dietary instructions, activity restrictions, and follow up appointments.    Discharge Condition: Stable  Kevin Fenton, MD 06/26/2012, 2:43 PM

## 2012-06-26 NOTE — Progress Notes (Signed)
Clinical Social Work  Patient has been accepted to [facility] under Dr. Edythe Lynn but need patient to be IVC before accepting patient. CSW informed MD and completed IVC paperwork. CSW faxed IVC paperwork to Magistrate and called to confirm information was received. CSW called patient's uncle and explained patient's acceptance. Uncle to bring clothes for patient to the hospital. CSW needs to fax IVC to hospital once patient is served. RN agreeable to call CSW when police officer arrives. CSW will continue to follow to assist with dc planning.  [place], [place] [phone]

## 2012-06-27 NOTE — Progress Notes (Addendum)
Pt was handed her meds and she put them in her mouth then immediately spit them out. I attempted to give them one more time and pt spit them out again and stated she could not swallow them so I did not try again after that.

## 2012-06-27 NOTE — Discharge Summary (Signed)
Family Medicine Teaching Service  Discharge Note : Attending Jeff Walden MD Pager [phone] Inpatient Team Pager:  [phone]  I have seen and examined this patient, reviewed their chart and discussed discharge planning with the resident at the time of discharge. I agree with the discharge plan as above.  

## 2012-06-27 NOTE — Progress Notes (Signed)
Family Medicine Teaching Service Daily Progress Note Service Page: [phone]  Subjective:  Saw patient before discharge to [facility] Feels fine this am, some SOB that's improved with her inhaler. Some headache. Did not sleep well.  Denies SI and visual/auditory hallucinations.   Objective: Temp:  [98.2 F (36.8 C)-99.5 F (37.5 C)] 98.5 F (36.9 C) (01/31 0524) Pulse Rate:  [80-109] 80  (01/31 0524) Resp:  [18-20] 19  (01/31 0524) BP: (125-139)/(80-83) 127/80 mmHg (01/31 0524) SpO2:  [100 %] 100 % (01/31 0524) Exam: Gen: NAD, alert, cooperative with exam, calm HEENT: NCAT CV: RRR, good S1/S2, 3/6 systolic murmur Resp: CTABL, no wheezes, non-labored Abd: Soft, mild tenderness in RUQ and LUQ, no murphy sign, BS present, no guarding or organomegaly, liver not palpable Ext: No edema, warm Neuro: Alert and oriented X 3, Strength 5/5 throughout, sensatsion intact in all 4 extremities, no tremors observed Psych: Denies, SI, denies visual hallucinations, Appropriate mood and affect   I have reviewed the patient's medications, labs, imaging, and diagnostic testing.  Notable results are summarized below.  CBC BMET   Lab 06/26/12 1040 06/25/12 0455 06/24/12 1525  WBC 5.1 5.6 4.3  HGB 10.9* 10.5* 11.9*  HCT 30.7* 28.8* 34.8*  PLT 84* 75* 88*    Lab 06/26/12 1040 06/25/12 0455 06/24/12 1525  NA 139 144 140  K 3.7 3.4* 3.1*  CL 107 109 104  CO2 23 21 18*  BUN 6 8 10   CREATININE 0.34* 0.35* 0.53  GLUCOSE 107* 91 107*  CALCIUM 8.4 8.2* 8.5     Imaging/Diagnostic Tests: CT Head W/o 06/24/2012 IMPRESSION:  1. No acute intracranial abnormalities.  2. Physiologic calcifications in the basal ganglia bilaterally.  DG hand R 06/24/2012 IMPRESSION:  Negative.  CXR 06/25/2012 IMPRESSION:  No active cardiopulmonary process.  Plan: 86 YOF with a history of somatization disorder/mental retardation, primary biliary cirrhosis, epilepsy who presents with increased agitation,  confusion, and hallucinations over the past 3-4 days.   Altered mental status with confusion, agitation, and hallucinations- clearing- likely schizophrenia - CT head negative for acute process.  - UDS, tylenol, alcohol salicylate levels normal.  - Psychiatry consulting and feels findings consistent with paranoid schizophrenia- appreciate recommendations  - Latuda 40 mg qd  - PRN benztropine for EPS side effects, although not expected  - Psych placement obtained at   - Will follow - Sitter at bedside  - Agree with above but will remain cognisent of her hepatic status and monitor for metabolic encephalopathy - No signs of infection currently - Also Hx of MR and somatization d/o - Monitor for EPS, some tremors, PRN benztropine  Metabolic acidosis (low bicarb on BMET).  - Lactic acid WNL, resolved this AM  Chest pain  - Likely respiratory related, limited by patient's ability to give history - Monitor  History of epilepsy - no events - Continue home Vimpat, Keppra   Allergic rhinitis, asthma  -Stable  - Continue home albuterol, QVAR   Anemia  - Hgb 10.9 on 1/30 from 11.1 on admission - Some schistocytes on smear, LDH and haptoglobin - Monitor  Thrombocytopenia. - 88 on admission, 114 02/2012, 150-180s a year ago - 84 on recheck on 1/30 - Ursodiol causes thrombocytopenia in 1%.  - Peripheral smear shows large platelets   History of primary biliary cirrhosis  - LFTs appear stable,  INR 1.79 - Ammonia 23 - Continue home ursodiol  - monitor  FEN/GI: Hypokalemia, Replete. Mag 2.1 DISPO: to [facility] hospital in [place], patient discharged  currently   -Her uncle knows her well. His cell phone number is [phone]. He felt like before this, she did very well living by herself at home. He lives next door.   -She already gets home health.   Kevin Fenton, MD 06/27/2012, 9:26 AM

## 2012-06-27 NOTE — Progress Notes (Signed)
FMTS Attending Daily Note:  Renold Don MD  [phone] pager  Family Practice pager:  [phone] I have discussed this patient with the resident Dr. Ermalinda Memos.  I agree with their findings, assessment, and care plan.  Patient discharged before any real management possible today.  No acute issues.

## 2012-07-11 HISTORY — PX: LIVER TRANSPLANT: SHX410

## 2012-07-22 ENCOUNTER — Telehealth: Payer: Self-pay | Admitting: Family Medicine

## 2012-07-22 NOTE — Telephone Encounter (Signed)
Advised patient that the Lac-Hydrin is to be used for dry skin on hands and feet. Also triamcinolone can be used on hands and feet. Advised not to use on face.. Patient states she has a rash on her face that started while she was in hospital. Advised that MD would need to look at rash to know how to treat and offered appointment. She will call back to schedule.

## 2012-07-22 NOTE — Telephone Encounter (Signed)
Patient calls and needs instructions on an ointment that she is using, cannot remember what body part to apply ointment to. Pls call patient back with instructions.

## 2012-08-06 ENCOUNTER — Ambulatory Visit: Payer: Self-pay | Admitting: Family Medicine

## 2012-08-13 ENCOUNTER — Telehealth: Payer: Self-pay | Admitting: *Deleted

## 2012-08-13 NOTE — Telephone Encounter (Signed)
Patient is calling wanting to know if if she could take her Vimpat at night along with her Keppra?

## 2012-08-13 NOTE — Telephone Encounter (Signed)
Yes she can take both at the same time.

## 2012-08-14 NOTE — Telephone Encounter (Signed)
Spoke to patient and she will start taking both medicines at night.

## 2012-08-19 ENCOUNTER — Telehealth: Payer: Self-pay | Admitting: *Deleted

## 2012-08-19 NOTE — Telephone Encounter (Signed)
Patient calls at 4:30 PM today stating she is having pain in right side and leg and is off balance.  When she stands up she tilts forward. Feels weak and tired. Gritting her teeth. Has been going on for a few weeks but has gotten worse today.  She thinks it could be due to a new medication she is on..  Has no one there with her but states she can call her uncle to come . Advised to go to Urgent Care today to be evaluated. She voices understanding.

## 2012-08-20 ENCOUNTER — Encounter: Payer: Self-pay | Admitting: Family Medicine

## 2012-08-20 ENCOUNTER — Ambulatory Visit (INDEPENDENT_AMBULATORY_CARE_PROVIDER_SITE_OTHER): Payer: Medicaid Other | Admitting: Family Medicine

## 2012-08-20 VITALS — BP 103/68 | HR 86 | Temp 98.4°F | Ht 60.0 in | Wt 144.0 lb

## 2012-08-20 DIAGNOSIS — M79672 Pain in left foot: Secondary | ICD-10-CM | POA: Insufficient documentation

## 2012-08-20 DIAGNOSIS — R5381 Other malaise: Secondary | ICD-10-CM

## 2012-08-20 DIAGNOSIS — M79609 Pain in unspecified limb: Secondary | ICD-10-CM

## 2012-08-20 DIAGNOSIS — R5383 Other fatigue: Secondary | ICD-10-CM | POA: Insufficient documentation

## 2012-08-20 NOTE — Progress Notes (Signed)
[name] is a 37 y.o. female who presents to [facility] today for feeling week  Sx onset at the end of February. Started latuda and Perphenazine at that time. Appt set to see phychiatrist Dr. Eulah Pont in early April. Deneis SI/HI. Pt is AAOx3.   L foot pain: started in December intermittently. Progressively worse. Sharp pain. Worse' w/ ambulation. Better w/ rest. Previous ankle fracture of L foot several years ago. Calluses present on feet bilateral that pt does not shave. Uses custom orthotics made in [facility] in 10/2011. No change in daily regimen w/ regards to ambulation. Current shoes are about 4 months old. No loss of sensation or falls.   The following portions of the patient's history were reviewed and updated as appropriate: allergies, current medications, past medical history, family and social history, and problem list.  Patient is a nonsmoker  Past Medical History  Diagnosis Date  . Allergy   . Anemia   . Seizures   . Intellectual disability   . Constipation   . Asthma   . Abnormal vaginal bleeding   . Biliary cirrhosis 04/26/11  . 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 DUB (dysfunctional uterine bleeding)   . PONV (postoperative nausea and vomiting)     ROS as above otherwise neg.    Medications reviewed. Current Outpatient Prescriptions  Medication Sig Dispense Refill  . albuterol (PROVENTIL,VENTOLIN) 90 MCG/ACT inhaler Inhale 2 puffs into the lungs every 4 (four) hours as needed. For wheezing      . ammonium lactate (LAC-HYDRIN) 12 % cream Apply topically as needed for dry skin.  385 g  0  . Azelastine HCl (ASTEPRO) 0.15 % SOLN Place 2 sprays into the nose daily.       . beclomethasone (QVAR) 80 MCG/ACT inhaler Inhale 2 puffs into the lungs 2 (two) times daily.      . benzonatate (TESSALON PERLES) 100 MG capsule Take 1 capsule (100 mg total) by mouth 3 (three) times daily as needed for cough.  20 capsule  0  . benztropine (COGENTIN) 0.5 MG tablet Take 1 tablet (0.5 mg  total) by mouth 2 (two) times daily as needed.  60 tablet  0  . budesonide-formoterol (SYMBICORT) 160-4.5 MCG/ACT inhaler Inhale 2 puffs into the lungs 2 (two) times daily.        . camphor-menthol (SARNA) lotion Apply topically as needed for itching.  222 mL  0  . cetirizine (ZYRTEC) 10 MG tablet Take 1 tablet (10 mg total) by mouth every morning.  30 tablet  2  . cholestyramine light (PREVALITE) 4 G packet Take 8 g by mouth 2 (two) times daily.      Marland Kitchen dextromethorphan (DELSYM) 30 MG/5ML liquid Take 5 mLs (30 mg total) by mouth every 8 (eight) hours as needed for cough.  148 mL  0  . ferrous sulfate 325 (65 FE) MG tablet Take 1 tablet (325 mg total) by mouth 2 (two) times daily with a meal.  60 tablet  6  . Lacosamide 100 MG TABS Take 1.5 tablets by mouth 2 (two) times daily.      Marland Kitchen levETIRAcetam (KEPPRA) 500 MG tablet Take 500 mg by mouth every 12 (twelve) hours.      Marland Kitchen lurasidone (LATUDA) 40 MG TABS Take 1 tablet (40 mg total) by mouth daily with breakfast.  30 tablet  0  . montelukast (SINGULAIR) 10 MG tablet Take 1 tablet (10 mg total) by mouth daily.  30 tablet  12  . Multiple  Vitamin (MULTIVITAMIN) tablet Take 1 tablet by mouth daily.      Marland Kitchen omeprazole (PRILOSEC) 40 MG capsule Take 40 mg by mouth daily.      . ursodiol (ACTIGALL) 300 MG capsule Take 2 capsules (600 mg total) by mouth 2 (two) times daily.  120 capsule  0  . VITAMIN D, CHOLECALCIFEROL, PO Take 1 tablet by mouth daily.       No current facility-administered medications for this visit.    Exam: BP 103/68  Pulse 86  Temp(Src) 98.4 F (36.9 C) (Oral)  Ht 5' (1.524 m)  Wt 144 lb (65.318 kg)  BMI 28.12 kg/m2  LMP 06/30/2012 Gen: Well NAD HEENT: EOMI,  MMM Lungs: CTABL Nl WOB Heart: RRR no MRG Psych: AAOx3, somewhat tangential in thinking Skin: warm well perfused, intact. No rash MSK: FROM of L foot and toes. Mild pain on palpation of the medial and lateral aspects of the foot at the MTP joints. Adequate fat pad on  the plantar surface. Prominent callous formations of the 2-4 plantar surfaces at the MTP.    No results found for this or any previous visit (from the past 72 hour(s)).

## 2012-08-20 NOTE — Patient Instructions (Addendum)
Thank you for coming in today. I think your tired feelings are coming from your new medicines, Latuda and perphenazine. These need to be adjusted by Dr. Eulah Pont. Make sure to go to your appointment next month Please purchase a new pair of athletic shoes and put your insoles in them. If your foot pain persists you may need to go back to sports medicine clinic to have your insoles revised You may try taking 200-400mg  of ibuprofen for your foot pain. Rest your feet as much as possible.  Follow up with Dr. Gwenlyn Saran as needed.  Have a great day

## 2012-08-20 NOTE — Assessment & Plan Note (Signed)
MSK pain: Pt to try 200-400mg  Ibuprofen PRN for pain No obvious malformation or injury.  No acute process Likely progressive degenerative changes of the foot Pt to purchase new tennis shoes as her current ones are very worn out. If symptoms do not improve may need to see [facility] again for custom orthotic adjustments.

## 2012-08-20 NOTE — Assessment & Plan Note (Addendum)
Likely secondary to starting  latuda and Perphenazine. Recommended to have pt discuss reducing dose w/ Dr. Eulah Pont. Not willing to change dose at this time given pts complicated psych history and current abscence of SI/HI or psychotic behavior.  Labs reviewed and no other obvious source of acute fatigue. Not profoundly affecting daily life.

## 2012-08-23 ENCOUNTER — Emergency Department (HOSPITAL_COMMUNITY)
Admission: EM | Admit: 2012-08-23 | Discharge: 2012-08-23 | Disposition: A | Discharge status: Home / Self Care | Payer: Medicaid Other | Attending: Emergency Medicine | Admitting: Emergency Medicine

## 2012-08-23 ENCOUNTER — Emergency Department (HOSPITAL_COMMUNITY): Payer: Medicaid Other

## 2012-08-23 DIAGNOSIS — Z8719 Personal history of other diseases of the digestive system: Secondary | ICD-10-CM | POA: Insufficient documentation

## 2012-08-23 DIAGNOSIS — Z79899 Other long term (current) drug therapy: Secondary | ICD-10-CM | POA: Insufficient documentation

## 2012-08-23 DIAGNOSIS — T148XXA Other injury of unspecified body region, initial encounter: Secondary | ICD-10-CM

## 2012-08-23 DIAGNOSIS — R42 Dizziness and giddiness: Secondary | ICD-10-CM | POA: Insufficient documentation

## 2012-08-23 DIAGNOSIS — Z8669 Personal history of other diseases of the nervous system and sense organs: Secondary | ICD-10-CM | POA: Insufficient documentation

## 2012-08-23 DIAGNOSIS — J45909 Unspecified asthma, uncomplicated: Secondary | ICD-10-CM | POA: Insufficient documentation

## 2012-08-23 DIAGNOSIS — S298XXA Other specified injuries of thorax, initial encounter: Secondary | ICD-10-CM | POA: Insufficient documentation

## 2012-08-23 DIAGNOSIS — Y929 Unspecified place or not applicable: Secondary | ICD-10-CM | POA: Insufficient documentation

## 2012-08-23 DIAGNOSIS — W1809XA Striking against other object with subsequent fall, initial encounter: Secondary | ICD-10-CM | POA: Insufficient documentation

## 2012-08-23 DIAGNOSIS — Z8659 Personal history of other mental and behavioral disorders: Secondary | ICD-10-CM | POA: Insufficient documentation

## 2012-08-23 DIAGNOSIS — S0990XA Unspecified injury of head, initial encounter: Secondary | ICD-10-CM | POA: Insufficient documentation

## 2012-08-23 DIAGNOSIS — IMO0002 Reserved for concepts with insufficient information to code with codable children: Secondary | ICD-10-CM | POA: Insufficient documentation

## 2012-08-23 DIAGNOSIS — Y939 Activity, unspecified: Secondary | ICD-10-CM | POA: Insufficient documentation

## 2012-08-23 DIAGNOSIS — Z862 Personal history of diseases of the blood and blood-forming organs and certain disorders involving the immune mechanism: Secondary | ICD-10-CM | POA: Insufficient documentation

## 2012-08-23 DIAGNOSIS — S0993XA Unspecified injury of face, initial encounter: Secondary | ICD-10-CM | POA: Insufficient documentation

## 2012-08-23 DIAGNOSIS — T07XXXA Unspecified multiple injuries, initial encounter: Secondary | ICD-10-CM | POA: Insufficient documentation

## 2012-08-23 DIAGNOSIS — Z8742 Personal history of other diseases of the female genital tract: Secondary | ICD-10-CM | POA: Insufficient documentation

## 2012-08-23 LAB — CBC WITH DIFFERENTIAL/PLATELET
Basophils Absolute: 0 10*3/uL (ref 0.0–0.1)
Eosinophils Relative: 6 % — ABNORMAL HIGH (ref 0–5)
HCT: 35.6 % — ABNORMAL LOW (ref 36.0–46.0)
Hemoglobin: 12.7 g/dL (ref 12.0–15.0)
Lymphocytes Relative: 37 % (ref 12–46)
Lymphs Abs: 1.7 10*3/uL (ref 0.7–4.0)
MCV: 81.8 fL (ref 78.0–100.0)
Monocytes Absolute: 0.5 10*3/uL (ref 0.1–1.0)
Monocytes Relative: 11 % (ref 3–12)
Neutro Abs: 2.1 10*3/uL (ref 1.7–7.7)
RBC: 4.35 MIL/uL (ref 3.87–5.11)
WBC: 4.6 10*3/uL (ref 4.0–10.5)

## 2012-08-23 LAB — COMPREHENSIVE METABOLIC PANEL
AST: 126 U/L — ABNORMAL HIGH (ref 0–37)
CO2: 25 mEq/L (ref 19–32)
Calcium: 8.7 mg/dL (ref 8.4–10.5)
Chloride: 103 mEq/L (ref 96–112)
Creatinine, Ser: 0.52 mg/dL (ref 0.50–1.10)
GFR calc Af Amer: >90 mL/min (ref >90)
GFR calc non Af Amer: >90 mL/min (ref >90)
Glucose, Bld: 99 mg/dL (ref 70–99)
Total Bilirubin: 2.8 mg/dL — ABNORMAL HIGH (ref 0.3–1.2)

## 2012-08-23 NOTE — ED Notes (Signed)
Pt @ x-ray

## 2012-08-23 NOTE — ED Notes (Signed)
Per EMS- Pt states started 2 new meds in February and has been dizzy since. Pt fell on mobile coat rack, hit fore head on rack and right hip. No signs of injuries. States 10/10 right hip pain. No shortening or rotation. Pt fully immobilized for precaution. Pt unsure of LOC. BP 98/73, HR 72, 98% RA, RR 18.

## 2012-08-23 NOTE — ED Provider Notes (Addendum)
History     CSN: [id number]  Arrival date & time 08/23/12  1452   First MD Initiated Contact with Patient 08/23/12 1459      Chief Complaint  Patient presents with  . Fall    (Consider location/radiation/quality/duration/timing/severity/associated sxs/prior treatment) HPI Comments: Patient comes to the ER for evaluation after a fall. Patient has been experiencing weakness and dizziness since being started on new medications 2 months ago. Patient reports that she fell backwards today. She lost her balance because of the dizziness, no complete loss of consciousness. Patient reports that she is experiencing moderate headache, neck pain, back pain, right shoulder and right hip pain. Patient also experiencing pain in the right upper chest wall area. Chest pain has started since the fall, was not present prior to the fall. No shortness of breath. Patient denies abdominal pain. No recent illness.  Patient is a 37 y.o. female presenting with fall.  Fall Associated symptoms include headaches.    Past Medical History  Diagnosis Date  . Allergy   . Anemia   . Seizures   . Intellectual disability   . Constipation   . Asthma   . Abnormal vaginal bleeding   . Biliary cirrhosis 04/26/11  . 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 DUB (dysfunctional uterine bleeding)   . PONV (postoperative nausea and vomiting)     Past Surgical History  Procedure Laterality Date  . Appendectomy    . Cholecystectomy    . Eye muscle surgery    . Eustacean tubes    . Colonoscopy w/ biopsies    . Adenonoidectomy      Family History  Problem Relation Age of Onset  . Diabetes Mother   . Heart disease Mother   . COPD Mother     History  Substance Use Topics  . Smoking status: Never Smoker   . Smokeless tobacco: Never Used  . Alcohol Use: No    OB History   Grav Para Term Preterm Abortions TAB SAB Ect Mult Living                  Review of Systems  HENT: Positive for neck pain.     Cardiovascular: Positive for chest pain.  Musculoskeletal: Positive for back pain.  Neurological: Positive for dizziness, weakness and headaches.  All other systems reviewed and are negative.    Allergies  Aspirin; Latex; Penicillins; Pseudoephedrine; Sulfa antibiotics; and Tramadol hcl  Home Medications   Current Outpatient Rx  Name  Route  Sig  Dispense  Refill  . albuterol (PROVENTIL,VENTOLIN) 90 MCG/ACT inhaler   Inhalation   Inhale 2 puffs into the lungs every 4 (four) hours as needed. For wheezing         . ammonium lactate (LAC-HYDRIN) 12 % cream   Topical   Apply topically as needed for dry skin.   385 g   0   . Azelastine HCl (ASTEPRO) 0.15 % SOLN   Nasal   Place 2 sprays into the nose daily.          . beclomethasone (QVAR) 80 MCG/ACT inhaler   Inhalation   Inhale 2 puffs into the lungs 2 (two) times daily.         . benzonatate (TESSALON PERLES) 100 MG capsule   Oral   Take 1 capsule (100 mg total) by mouth 3 (three) times daily as needed for cough.   20 capsule   0   . benztropine (COGENTIN) 0.5 MG tablet   Oral  Take 1 tablet (0.5 mg total) by mouth 2 (two) times daily as needed.   60 tablet   0   . budesonide-formoterol (SYMBICORT) 160-4.5 MCG/ACT inhaler   Inhalation   Inhale 2 puffs into the lungs 2 (two) times daily.           . camphor-menthol (SARNA) lotion   Topical   Apply topically as needed for itching.   222 mL   0   . cetirizine (ZYRTEC) 10 MG tablet   Oral   Take 1 tablet (10 mg total) by mouth every morning.   30 tablet   2   . cholestyramine light (PREVALITE) 4 G packet   Oral   Take 8 g by mouth 2 (two) times daily.         Marland Kitchen dextromethorphan (DELSYM) 30 MG/5ML liquid   Oral   Take 5 mLs (30 mg total) by mouth every 8 (eight) hours as needed for cough.   148 mL   0   . ferrous sulfate 325 (65 FE) MG tablet   Oral   Take 1 tablet (325 mg total) by mouth 2 (two) times daily with a meal.   60 tablet   6    . Lacosamide 100 MG TABS   Oral   Take 1.5 tablets by mouth 2 (two) times daily.         Marland Kitchen levETIRAcetam (KEPPRA) 500 MG tablet   Oral   Take 500 mg by mouth every 12 (twelve) hours.         Marland Kitchen lurasidone (LATUDA) 40 MG TABS   Oral   Take 1 tablet (40 mg total) by mouth daily with breakfast.   30 tablet   0   . montelukast (SINGULAIR) 10 MG tablet   Oral   Take 1 tablet (10 mg total) by mouth daily.   30 tablet   12   . Multiple Vitamin (MULTIVITAMIN) tablet   Oral   Take 1 tablet by mouth daily.         Marland Kitchen omeprazole (PRILOSEC) 40 MG capsule   Oral   Take 40 mg by mouth daily.         . ursodiol (ACTIGALL) 300 MG capsule   Oral   Take 2 capsules (600 mg total) by mouth 2 (two) times daily.   120 capsule   0   . VITAMIN D, CHOLECALCIFEROL, PO   Oral   Take 1 tablet by mouth daily.           BP 124/91  Pulse 90  Temp(Src) 97.6 F (36.4 C) (Oral)  Resp 20  SpO2 99%  LMP 06/30/2012  Physical Exam  Constitutional: She is oriented to person, place, and time. She appears well-developed and well-nourished. No distress.  HENT:  Head: Normocephalic and atraumatic.  Right Ear: Hearing normal.  Nose: Nose normal.  Mouth/Throat: Oropharynx is clear and moist and mucous membranes are normal.  Eyes: Conjunctivae and EOM are normal. Pupils are equal, round, and reactive to light.  Neck: Normal range of motion. Neck supple. Muscular tenderness present.  Cardiovascular: Normal rate, regular rhythm, S1 normal and S2 normal.  Exam reveals no gallop and no friction rub.   No murmur heard. Pulmonary/Chest: Effort normal and breath sounds normal. No respiratory distress. She exhibits no tenderness.    Abdominal: Soft. Normal appearance and bowel sounds are normal. There is no hepatosplenomegaly. There is no tenderness. There is no rebound, no guarding, no tenderness at McBurney's point and negative  Murphy's sign. No hernia.  Musculoskeletal: Normal range of motion.        Right shoulder: She exhibits tenderness. She exhibits no deformity.       Right hip: She exhibits tenderness. She exhibits no deformity.  Neurological: She is alert and oriented to person, place, and time. She has normal strength. No cranial nerve deficit or sensory deficit. Coordination normal. GCS eye subscore is 4. GCS verbal subscore is 5. GCS motor subscore is 6.  Skin: Skin is warm, dry and intact. No rash noted. No cyanosis.  Psychiatric: She has a normal mood and affect. Her speech is normal and behavior is normal. Thought content normal.    ED Course  Procedures (including critical care time)  EKG:  Date: 08/23/2012  Rate: 88  Rhythm: normal sinus rhythm  QRS Axis: normal  Intervals: normal  ST/T Wave abnormalities: normal  Conduction Disutrbances: none  Narrative Interpretation: unremarkable      Labs Reviewed  CBC WITH DIFFERENTIAL - Abnormal; Notable for the following:    HCT 35.6 (*)    Platelets 87 (*)    Eosinophils Relative 6 (*)    All other components within normal limits  COMPREHENSIVE METABOLIC PANEL - Abnormal; Notable for the following:    Albumin 3.3 (*)    AST 126 (*)    ALT 106 (*)    Alkaline Phosphatase 161 (*)    Total Bilirubin 2.8 (*)    All other components within normal limits  TROPONIN I  URINALYSIS, ROUTINE W REFLEX MICROSCOPIC   Dg Chest 1 View  08/23/2012  *RADIOLOGY REPORT*  Clinical Data: History of fall and complaining of back pain.  CHEST - 1 VIEW  Comparison: 06/25/2012  Findings: Chest radiograph demonstrates clear lungs. Heart and mediastinum are within normal limits.  The trachea is midline.  No evidence for a pneumothorax.  IMPRESSION: No acute chest findings.   Original Report Authenticated By: Richarda Overlie, M.D.    Dg Thoracic Spine 2 View  08/23/2012  *RADIOLOGY REPORT*  Clinical Data: Fall and back pain.  THORACIC SPINE - 2 VIEW  Comparison: Lumbar spine 08/23/2012 and cervical spine CT 08/23/2012  Findings: The T12 ribs  appear to be hypoplastic.  Normal alignment of the thoracic spine.  Vertebral body heights are maintained. Normal alignment at the thoracolumbar junction.  IMPRESSION: No acute bony abnormality in the thoracic spine.   Original Report Authenticated By: Richarda Overlie, M.D.    Dg Lumbar Spine Complete  08/23/2012  *RADIOLOGY REPORT*  Clinical Data: Back pain following a fall.  LUMBAR SPINE - COMPLETE 4+ VIEW  Comparison: Abdomen pelvis CT dated 10/30/2010.  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  These have normal appearances with no fractures, pars defects or subluxations.  Right abdominal and left pelvic surgical clips.  IMPRESSION: Normal appearing lumbar spine.   Original Report Authenticated By: Beckie Salts, M.D.    Dg Shoulder Right  08/23/2012  *RADIOLOGY REPORT*  Clinical Data: Fall and right shoulder pain.  RIGHT SHOULDER - 2+ VIEW  Comparison: 02/23/2007  Findings: Three views of the right shoulder were obtained.  The right shoulder is located without acute fracture. Lucency extending through the right scapula is most likely related to overlying soft tissues.  Visualized right ribs are intact.  IMPRESSION: No acute bony abnormalities.   Original Report Authenticated By: Richarda Overlie, M.D.    Dg Hip Complete Right  08/23/2012  *RADIOLOGY REPORT*  Clinical Data: Fall and right hip  pain.  RIGHT HIP - COMPLETE 2+ VIEW  Comparison: None.  Findings: AP view of the pelvis and two views of the right hip. Surgical clip in the right lower abdomen.  The pelvic bony ring is intact.  The right hip is located without acute fracture. Nonspecific bowel gas pattern.  There is stool in the rectal region.  IMPRESSION: No acute bony abnormality to the pelvis or right hip.   Original Report Authenticated By: Richarda Overlie, M.D.    Ct Head Wo Contrast  08/23/2012  *RADIOLOGY REPORT*  Clinical Data:  37 year old with fall and weakness.  CT HEAD WITHOUT CONTRAST CT  CERVICAL SPINE WITHOUT CONTRAST  Technique:  Multidetector CT imaging of the head and cervical spine was performed following the standard protocol without intravenous contrast.  Multiplanar CT image reconstructions of the cervical spine were also generated.  Comparison:  06/24/2012  CT HEAD  Findings: No evidence for acute hemorrhage, mass lesion, midline shift, hydrocephalus or large infarct.  Small calcifications in the basal ganglia.  Normal appearance of the ventricles and basal cisterns.  No acute bony abnormality.  IMPRESSION: No acute intracranial abnormality.  CT CERVICAL SPINE  Findings: Lung apices are clear.  No evidence for an acute fracture or dislocation.  No significant soft tissue swelling within the neck.  Normal alignment of the cervical spine.  IMPRESSION: Negative cervical spine CT.   Original Report Authenticated By: Richarda Overlie, M.D.    Ct Cervical Spine Wo Contrast  08/23/2012  *RADIOLOGY REPORT*  Clinical Data:  37 year old with fall and weakness.  CT HEAD WITHOUT CONTRAST CT CERVICAL SPINE WITHOUT CONTRAST  Technique:  Multidetector CT imaging of the head and cervical spine was performed following the standard protocol without intravenous contrast.  Multiplanar CT image reconstructions of the cervical spine were also generated.  Comparison:  06/24/2012  CT HEAD  Findings: No evidence for acute hemorrhage, mass lesion, midline shift, hydrocephalus or large infarct.  Small calcifications in the basal ganglia.  Normal appearance of the ventricles and basal cisterns.  No acute bony abnormality.  IMPRESSION: No acute intracranial abnormality.  CT CERVICAL SPINE  Findings: Lung apices are clear.  No evidence for an acute fracture or dislocation.  No significant soft tissue swelling within the neck.  Normal alignment of the cervical spine.  IMPRESSION: Negative cervical spine CT.   Original Report Authenticated By: Richarda Overlie, M.D.      Diagnosis: 1. Dizziness 2. Fall with multiple  contusions    MDM  Patient brought to the ER after a fall. Patient reports that she has been dizzy for a couple of weeks, may be related to her medications. Patient did not lose consciousness but is complaining of multiple areas of pain including head and neck pain. Patient underwent CT head and cervical spine which were negative. Multiple other x-rays were performed to the areas that she was hurting including the right hip. All x-rays were negative. Patient reassured, she'll be discharged home. She is to followup with her Dr. for further evaluation of weakness and dizziness.        Gilda Crease, MD 08/23/12 1714  Gilda Crease, MD 08/23/12 [phone]

## 2012-08-25 ENCOUNTER — Telehealth: Payer: Self-pay | Admitting: *Deleted

## 2012-08-25 ENCOUNTER — Telehealth: Payer: Self-pay | Admitting: Family Medicine

## 2012-08-25 DIAGNOSIS — F200 Paranoid schizophrenia: Secondary | ICD-10-CM

## 2012-08-25 MED ORDER — LACOSAMIDE 100 MG PO TABS: 2.0000 | ORAL_TABLET | Freq: Two times a day (BID) | ORAL | Status: DC

## 2012-08-25 NOTE — Telephone Encounter (Signed)
Will fwd. To PCP for review .Ivis Henneman  

## 2012-08-25 NOTE — Telephone Encounter (Signed)
Needs for nurse to call [name] to come and help her.  They were supposed to set it up when she was in the hospital, but she hasn't heard anything yet.

## 2012-08-25 NOTE — Telephone Encounter (Signed)
I have called her, she complains of legs gave out, black out sometimes x 3 weeks, years ago she has similar episodes for seizure, She had 4-5 episodes in past 3 weeks,   She is taking vimpat 100mg  one and 1/2 , keppra 500mg  one/ 2 tabs.  She was started on Perphenazine 2mg , she has visual hallucination, latuda 120mg  qday  Will increase vimpat to 100mg  ii bid, new rx called in

## 2012-08-25 NOTE — Telephone Encounter (Signed)
Patient is calling because she thinks that she is having seizures. She is having problems with gritting her teeth and balance. Please advise CM out of the office.

## 2012-08-26 ENCOUNTER — Telehealth: Payer: Self-pay | Admitting: *Deleted

## 2012-08-26 ENCOUNTER — Telehealth: Payer: Self-pay | Admitting: Neurology

## 2012-08-26 NOTE — Telephone Encounter (Signed)
Fwd. To PCP advise. [name], [name]

## 2012-08-26 NOTE — Telephone Encounter (Signed)
Patient is calling back because she is confused on what medicine she is suppose to stop and when to the start the medicine.

## 2012-08-26 NOTE — Telephone Encounter (Signed)
Patient is calling back to check on the status of the referral for [name] assistance.  Also, patient is feeling weakness since last week, had come in to be seen but the weakness is continuing and now her period has started and she is feeling even weaker.

## 2012-08-26 NOTE — Telephone Encounter (Signed)
If patient is having increased weakness, she needs to be evaluated in the clinic.  Also, regarding [employer], I spoke with Theresia Bough, CSW who let me know that I need to write an order for this. If patient wants it for extra services like home cleaning, she needs to call them directly.   I put in an order for home health to be set up with [facility] for home health aid, PT and OT eval.   Will forward this to Tessie Fass to call patient and let her know.    Marena Chancy, PGY-2 Family Medicine Resident

## 2012-08-26 NOTE — Telephone Encounter (Signed)
I have called her she should take vimpat 100mg  ii bid, Keppra 500mg  one in am, 2 tabs qhs.  I also called her Uncle [phone]. His name is Vincie Linn, left message.

## 2012-08-27 ENCOUNTER — Telehealth: Payer: Self-pay | Admitting: *Deleted

## 2012-08-27 ENCOUNTER — Telehealth: Payer: Self-pay | Admitting: Family Medicine

## 2012-08-27 NOTE — Telephone Encounter (Signed)
[name] uncle is calling needing someone to review her med change with him. She is the one that puts her medicine together and he needs to know what the change is. Please advise him. CM/Yan

## 2012-08-27 NOTE — Telephone Encounter (Signed)
Filled out application for home health agency. Ready to be faxed.   Gracelee Stemmler, PGY-2 Family Medicine Resident  

## 2012-08-27 NOTE — Telephone Encounter (Signed)
PCS form placed in Dr. Whitney Muse box to complete.  [name]

## 2012-08-27 NOTE — Telephone Encounter (Signed)
Call referral to [facility].  PCS form must be completed and faxed to [facility] first.  Then they will contact patient and go out and do an assessment to determine how many hours patient qualitifies for.  [place] will then be ask to choose an agency.  Once [employer] receives the approval from [place] they will contact patient and set up a time to go out and do their assessement.  This process can take a few weeks.  [facility] does not do PT or OT.  They only offer personal care services.   Ileana Ladd

## 2012-08-27 NOTE — Telephone Encounter (Signed)
Called Ms Berna Spare back with [facility], I told her that the referral is in process. She will call Joselynne and let her know.Mitch Arquette, [name]

## 2012-08-27 NOTE — Telephone Encounter (Signed)
[name] with DSS is calling to get an order for [name] to receive personal care services.  Need referral sent to [facility] or any home healthcare agency first.  Then forms will be faxed for provider to complete.  Please submit referral asap. If you need to call [name] for further info, please call [phone].  [name] would like to have feedback via telephone call to check status of referral.

## 2012-08-27 NOTE — Telephone Encounter (Signed)
Left message for Shirelle Tootle, at [phone],  explaining dosage of vimpat and Keppra, per Dr. Terrace Arabia.  Told him to call if he needed more clarification.

## 2012-09-04 ENCOUNTER — Telehealth: Payer: Self-pay | Admitting: *Deleted

## 2012-09-04 ENCOUNTER — Ambulatory Visit (INDEPENDENT_AMBULATORY_CARE_PROVIDER_SITE_OTHER): Payer: Medicaid Other | Admitting: Family Medicine

## 2012-09-04 ENCOUNTER — Encounter: Payer: Self-pay | Admitting: Family Medicine

## 2012-09-04 ENCOUNTER — Telehealth: Payer: Self-pay | Admitting: Family Medicine

## 2012-09-04 VITALS — BP 110/73 | HR 79 | Ht 60.0 in | Wt 142.9 lb

## 2012-09-04 DIAGNOSIS — R5381 Other malaise: Secondary | ICD-10-CM

## 2012-09-04 DIAGNOSIS — R5383 Other fatigue: Secondary | ICD-10-CM

## 2012-09-04 DIAGNOSIS — F200 Paranoid schizophrenia: Secondary | ICD-10-CM

## 2012-09-04 MED ORDER — SALINE NASAL SPRAY 0.65 % NA SOLN: 1.0000 | NASAL | Status: DC | PRN

## 2012-09-04 NOTE — Patient Instructions (Addendum)
It was nice to see you. I will see you back in 3 months or sooner if needed.

## 2012-09-04 NOTE — Telephone Encounter (Signed)
Faxed request for [facility]CS. [name], [name]

## 2012-09-04 NOTE — Progress Notes (Signed)
Patient ID: [name]    DOB: Sep 16, 1975, 37 y.o.   MRN: [id number] --- Subjective:  [name] is a 37 y.o.female who presents for filling out of form for home health services.  - Additionally, patient has been feeling some fatigue and weakness ever since she was discharged from behavioral [facility] hospital in January/Feb 2014. She was discharged on latuda and perphenazine. Perphenazine has since then been stopped. She sees Dr. Georjean Mode with psychiatry and has appointment on May 1st.  She continues to take live at home, but is in need of additional nursing services.     ROS: see HPI Past Medical History: a large part of the visit was spent reviewing patient's updated medications.  Social History: Tobacco: none  Objective: Filed Vitals:   09/04/12 1424  BP: 110/73  Pulse: 79    Physical Examination:   General appearance - alert, quiet, responds to questions appropriately, flat affect Chest - clear to auscultation, no wheezes, rales or rhonchi, symmetric air entry Heart - normal rate, regular rhythm, normal S1, S2, no murmurs, rubs, clicks or gallops Extremities - no edema

## 2012-09-04 NOTE — Telephone Encounter (Signed)
The patient is calling about the missing information from her paperwork for home care and now she has been denied.  Patient has has an appt today and will discuss this with Dr. Gwenlyn Saran at that time.

## 2012-09-05 LAB — CBC
HCT: 34.2 % — ABNORMAL LOW (ref 36.0–46.0)
MCV: 87.5 fL (ref 78.0–100.0)
RBC: 3.91 MIL/uL (ref 3.87–5.11)
WBC: 4.4 10*3/uL (ref 4.0–10.5)

## 2012-09-05 NOTE — Assessment & Plan Note (Signed)
Likely from latuda, but will check CBC to rule out any worsening anemia.

## 2012-09-05 NOTE — Assessment & Plan Note (Signed)
On latuda per psychiatry. Patient in need of personal services at home. Filled out paperwork which was then faxed by [place].

## 2012-09-16 ENCOUNTER — Encounter: Payer: Self-pay | Admitting: Family Medicine

## 2012-09-23 ENCOUNTER — Encounter (HOSPITAL_COMMUNITY): Payer: Self-pay | Admitting: *Deleted

## 2012-09-23 ENCOUNTER — Other Ambulatory Visit: Payer: Self-pay | Admitting: Gastroenterology

## 2012-09-23 ENCOUNTER — Telehealth: Payer: Self-pay | Admitting: Family Medicine

## 2012-09-23 DIAGNOSIS — K7460 Unspecified cirrhosis of liver: Secondary | ICD-10-CM

## 2012-09-23 NOTE — Telephone Encounter (Signed)
[name]  Has chest congestion and clear runny nose.  Zyrtec and Qvar inhaler d/c'd at last office visit.  Has "bad allergies, but unable to take allergy injections because it triggers my asthma."  Currently taking Singulair, Astepro and Flonase for allergies.  Patient made an appt with allergist on May 12.  Patient unable to come in this week due to has to give 3-day notice for transportation.  Patient requested appt for next week with Dr. Gwenlyn Saran and informed Dr. Gwenlyn Saran out of office in May.  Patient declined appt with another MD and states she will just wait to see her allergist.  Patient informed to call back if she changes mind or symptoms worsen and wants to be evaluated sooner.  Gaylene Brooks, RN

## 2012-09-23 NOTE — Telephone Encounter (Signed)
Pt is asking to speak with nurse - she is having asthma problems and not sure what to do.

## 2012-09-24 ENCOUNTER — Encounter (HOSPITAL_COMMUNITY): Payer: Self-pay | Admitting: Pharmacy Technician

## 2012-09-26 ENCOUNTER — Ambulatory Visit
Admission: RE | Admit: 2012-09-26 | Discharge: 2012-09-26 | Disposition: A | Discharge status: Home / Self Care | Payer: Medicaid Other | Source: Ambulatory Visit | Attending: Gastroenterology | Admitting: Gastroenterology

## 2012-09-26 DIAGNOSIS — K7460 Unspecified cirrhosis of liver: Secondary | ICD-10-CM

## 2012-09-29 ENCOUNTER — Other Ambulatory Visit: Payer: Self-pay | Admitting: Gastroenterology

## 2012-09-30 NOTE — Addendum Note (Signed)
Addended by: Rael Tilly on: 09/30/2012 09:22 AM   Modules accepted: Orders  

## 2012-10-01 ENCOUNTER — Ambulatory Visit (HOSPITAL_COMMUNITY): Payer: Medicaid Other | Admitting: Anesthesiology

## 2012-10-01 ENCOUNTER — Encounter (HOSPITAL_COMMUNITY)
Admission: RE | Disposition: A | Discharge status: Home / Self Care | Payer: Self-pay | Source: Ambulatory Visit | Attending: Gastroenterology

## 2012-10-01 ENCOUNTER — Ambulatory Visit (HOSPITAL_COMMUNITY)
Admission: RE | Admit: 2012-10-01 | Discharge: 2012-10-01 | Disposition: A | Discharge status: Home / Self Care | Payer: Medicaid Other | Source: Ambulatory Visit | Attending: Gastroenterology | Admitting: Gastroenterology

## 2012-10-01 ENCOUNTER — Encounter (HOSPITAL_COMMUNITY): Payer: Self-pay | Admitting: *Deleted

## 2012-10-01 ENCOUNTER — Encounter (HOSPITAL_COMMUNITY): Payer: Self-pay | Admitting: Anesthesiology

## 2012-10-01 DIAGNOSIS — D131 Benign neoplasm of stomach: Secondary | ICD-10-CM | POA: Insufficient documentation

## 2012-10-01 DIAGNOSIS — K921 Melena: Secondary | ICD-10-CM | POA: Insufficient documentation

## 2012-10-01 DIAGNOSIS — R109 Unspecified abdominal pain: Secondary | ICD-10-CM | POA: Insufficient documentation

## 2012-10-01 DIAGNOSIS — J45909 Unspecified asthma, uncomplicated: Secondary | ICD-10-CM | POA: Insufficient documentation

## 2012-10-01 DIAGNOSIS — K745 Biliary cirrhosis, unspecified: Secondary | ICD-10-CM | POA: Insufficient documentation

## 2012-10-01 HISTORY — DX: Gastro-esophageal reflux disease without esophagitis: K21.9

## 2012-10-01 HISTORY — DX: Unspecified osteoarthritis, unspecified site: M19.90

## 2012-10-01 HISTORY — PX: ESOPHAGOGASTRODUODENOSCOPY (EGD) WITH PROPOFOL: SHX5813

## 2012-10-01 LAB — TYPE AND SCREEN: ABO/RH(D): O POS

## 2012-10-01 LAB — PROTIME-INR: Prothrombin Time: 18.4 seconds — ABNORMAL HIGH (ref 11.6–15.2)

## 2012-10-01 SURGERY — ESOPHAGOGASTRODUODENOSCOPY (EGD) WITH PROPOFOL
Anesthesia: Monitor Anesthesia Care

## 2012-10-01 MED ORDER — MIDAZOLAM HCL 5 MG/5ML IJ SOLN
INTRAMUSCULAR | Status: DC | PRN
  Administered 2012-10-01: 2 mg via INTRAVENOUS

## 2012-10-01 MED ORDER — LACTATED RINGERS IV SOLN
INTRAVENOUS | Status: DC
  Administered 2012-10-01: 09:00:00 via INTRAVENOUS

## 2012-10-01 MED ORDER — FENTANYL CITRATE 0.05 MG/ML IJ SOLN
INTRAMUSCULAR | Status: DC | PRN
  Administered 2012-10-01 (×2): 50 ug via INTRAVENOUS

## 2012-10-01 MED ORDER — PROPOFOL 10 MG/ML IV EMUL
INTRAVENOUS | Status: DC | PRN
  Administered 2012-10-01: 100 ug/kg/min via INTRAVENOUS

## 2012-10-01 MED ORDER — BUTAMBEN-TETRACAINE-BENZOCAINE 2-2-14 % EX AERO
INHALATION_SPRAY | CUTANEOUS | Status: DC | PRN
  Administered 2012-10-01: 2 via TOPICAL

## 2012-10-01 MED ORDER — SODIUM CHLORIDE 0.9 % IV SOLN
INTRAVENOUS | Status: DC
  Administered 2012-10-01: 20 mL via INTRAVENOUS

## 2012-10-01 MED ORDER — FENTANYL CITRATE 0.05 MG/ML IJ SOLN: 25.0000 ug | INTRAMUSCULAR | Status: DC | PRN

## 2012-10-01 SURGICAL SUPPLY — 14 items

## 2012-10-01 NOTE — Anesthesia Postprocedure Evaluation (Signed)
  Anesthesia Post-op Note  Patient: [name]  Procedure(s) Performed: Procedure(s) (LRB): ESOPHAGOGASTRODUODENOSCOPY (EGD) WITH PROPOFOL (N/A)  Patient Location: PACU  Anesthesia Type: MAC  Level of Consciousness: awake and alert   Airway and Oxygen Therapy: Patient Spontanous Breathing  Post-op Pain: mild  Post-op Assessment: Post-op Vital signs reviewed, Patient's Cardiovascular Status Stable, Respiratory Function Stable, Patent Airway and No signs of Nausea or vomiting  Last Vitals:  Filed Vitals:   10/01/12 1430  BP: 123/83  Pulse:   Temp:   Resp: 14    Post-op Vital Signs: stable   Complications: No apparent anesthesia complications

## 2012-10-01 NOTE — Transfer of Care (Signed)
Immediate Anesthesia Transfer of Care Note  Patient: [name]  Procedure(s) Performed: Procedure(s): ESOPHAGOGASTRODUODENOSCOPY (EGD) WITH PROPOFOL (N/A)  Patient Location: PACU  Anesthesia Type:MAC  Level of Consciousness: awake, alert  and oriented  Airway & Oxygen Therapy: Patient Spontanous Breathing and Patient connected to nasal cannula oxygen  Post-op Assessment: Report given to PACU RN and Post -op Vital signs reviewed and stable  Post vital signs: Reviewed and stable  Complications: No apparent anesthesia complications

## 2012-10-01 NOTE — H&P (Signed)
Patient interval history reviewed.  Patient examined again.  There has been no change from documented H/P dated 09/23/12 (scanned into chart from our office) except as documented above.  Assessment:  1.  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 2.  Abdominal pain.  Plan:  1.  Endoscopy (coagulopathy, likely malabsorption in nature, has corrected after administration of two units of fresh frozen plasma). 2.  Risks (bleeding, infection, bowel perforation that could require surgery, sedation-related changes in cardiopulmonary systems), benefits (identification and possible treatment of source of symptoms, exclusion of certain causes of symptoms), and alternatives (watchful waiting, radiographic imaging studies, empiric medical treatment) of upper endoscopy (EGD) were explained to patient in detail and patient wishes to proceed.

## 2012-10-01 NOTE — Anesthesia Preprocedure Evaluation (Addendum)
Anesthesia Evaluation  Patient identified by MRN, date of birth, ID band Patient awake    Reviewed: Allergy & Precautions, H&P , NPO status , Patient's Chart, lab work & pertinent test results  History of Anesthesia Complications (+) PONV  Airway Mallampati: II TM Distance: >3 FB Neck ROM: full    Dental  (+) Missing and Dental Advisory Given Missing back teeth but front ones are OK:   Pulmonary asthma ,  breath sounds clear to auscultation  Pulmonary exam normal       Cardiovascular Exercise Tolerance: Good negative cardio ROS  Rhythm:regular Rate:Normal     Neuro/Psych Schizophrenia negative neurological ROS  negative psych ROS   GI/Hepatic negative GI ROS, (+) Cirrhosis -       , Primary biliary cirrhosis   Endo/Other  negative endocrine ROS  Renal/GU negative Renal ROS  negative genitourinary   Musculoskeletal   Abdominal   Peds  Hematology negative hematology ROS (+)   Anesthesia Other Findings   Reproductive/Obstetrics negative OB ROS                          Anesthesia Physical Anesthesia Plan  ASA: III  Anesthesia Plan: MAC   Post-op Pain Management:    Induction:   Airway Management Planned: Simple Face Mask  Additional Equipment:   Intra-op Plan:   Post-operative Plan:   Informed Consent: I have reviewed the patients History and Physical, chart, labs and discussed the procedure including the risks, benefits and alternatives for the proposed anesthesia with the patient or authorized representative who has indicated his/her understanding and acceptance.   Dental Advisory Given  Plan Discussed with: CRNA and Surgeon  Anesthesia Plan Comments:        Anesthesia Quick Evaluation

## 2012-10-01 NOTE — Op Note (Signed)
[facility] [address] [place], [address]   ENDOSCOPY PROCEDURE REPORT  PATIENT: Lori, Woodward  MR#: [id number] BIRTHDATE: 26-Jan-1976 , 36  yrs. old GENDER: Female ENDOSCOPIST: Willis Modena, MD REFERRED BY: PROCEDURE DATE:  10/01/2012 PROCEDURE:  EGD, diagnostic ASA CLASS:     Class III INDICATIONS:  abdominal pain, blood in stool. MEDICATIONS: MAC sedation, administered by CRNA TOPICAL ANESTHETIC: Cetacaine Spray  DESCRIPTION OF PROCEDURE: After the risks benefits and alternatives of the procedure were thoroughly explained, informed consent was obtained.  The Pentax Gastroscope [id number] endoscope was introduced through the mouth and advanced to the second portion of the duodenum. Without limitations.  The instrument was slowly withdrawn as the mucosa was fully examined.    Findings:  Normal esophagus; no esophageal varices seen.  Few small friable proximal gastric polyps, likely fundic gland polyps.  Few antral erosions and diffuse atrophic-appearing gastritis. Otherwise normal endoscopy to the second portion of the duodenum. Retroflexion into the cardia reveals no evidence of gastric varices.          The scope was then withdrawn from the patient and the procedure completed.  ENDOSCOPIC IMPRESSION:     As above.  No evidence of varices.  No explanation for patient's abdominal pain or blood in stool.   RECOMMENDATIONS:     1.  Watch for potential complications of procedure. 2.  Continue cholestyramine and ursodiol.  Follow-up with [facility] in 6-8 weeks. 3.  Patient had colonoscopy by Dr. Matthias Hughs in May 2012, for hemoccult-positive stool.  If bleeding worsens, consider repeat colonoscopy, but hopefully this won't be necessary. 4. Follow-up with [facility] in 6-8 weeks.  eSigned:  Willis Modena, MD 10/01/2012 2:23 PM   CC:

## 2012-10-02 ENCOUNTER — Encounter (HOSPITAL_COMMUNITY): Payer: Self-pay | Admitting: Gastroenterology

## 2012-10-02 LAB — PREPARE FRESH FROZEN PLASMA: Unit division: 0

## 2012-10-06 ENCOUNTER — Telehealth: Payer: Self-pay | Admitting: *Deleted

## 2012-10-06 NOTE — Telephone Encounter (Signed)
Dr. Willa Rough would like to speak to pt physician regarding pt care  - please return call at [phone]. Wyatt Haste, RN-BSN

## 2012-10-07 ENCOUNTER — Telehealth: Payer: Self-pay | Admitting: Family Medicine

## 2012-10-07 DIAGNOSIS — J309 Allergic rhinitis, unspecified: Secondary | ICD-10-CM

## 2012-10-07 DIAGNOSIS — J45909 Unspecified asthma, uncomplicated: Secondary | ICD-10-CM

## 2012-10-07 NOTE — Telephone Encounter (Signed)
Called Dr.Hicks with allergy immunology back. Dr. Willa Rough wanted to let me know that she had initially put Ms. Mitton on Symbicort and Qvar as a triple combination therapy for difficult to treat asthma/allergies as well as zyrtec and singulair. At her last office visit with me, I did not realize this and thought that she was taking duplicate meds and had stopped qvar and zyrtec. Since then, she has had worsening cough and wheezing, using her albuterol even at night time. At Dr. Marshell Garfinkel office she was 70-80% in office sirometry compared to her 105% usual number. Dr. Willa Rough started her on a 4 day course of prednisone, although she tries to avoid this in the context of her biliary cirrhosis. She started her back on Qvar 40 (as opposed to her usual 80). Allergy shots were tried in the past, but this did not work for her.  I thanked Dr. Willa Rough for letting me know about this and taking care of Ms. Cove.  Marena Chancy, PGY-2 Family Medicine Resident

## 2012-10-16 ENCOUNTER — Telehealth: Payer: Self-pay | Admitting: Family Medicine

## 2012-10-16 NOTE — Telephone Encounter (Signed)
[name] has had positive hemeoccult stools in the past and had a normal colonoscopy in 2012 and a normal EGD at the beginning of this month. If she has rectal bleeding, I would recommend she see her GI doctor if they are able to see her. If that is not possible, she should definitely be seen by someone in clinic in the next few days. If the bleeding gets worst, patient should go to urgent care or the ED.   Marena Chancy, PGY-2 Family Medicine Resident

## 2012-10-16 NOTE — Telephone Encounter (Signed)
Pt states there is blood on the toilet paper when she wipes and has been having hard stools.  I encouraged her to drink plenty of water and continue her stool softener and to try preparation H or Tucks pads and warms sitz baths.  I offered patient an appt for tomorrow, but she is unable to come due to transportation.  Will fwd to MD for further advice.  Jerrod Damiano, Darlyne Russian, CMA

## 2012-10-16 NOTE — Telephone Encounter (Signed)
Pt is bleeding from bottom and itching. Thinks it is her hemmorioad Please advise

## 2012-10-17 NOTE — Telephone Encounter (Signed)
Called patient to check on rectal bleeding.  PT. States it is still the same and has not gotten worse.  Instructed patient to call GI MD for appt beginning of next week and if they cant see her to call back and make appt for here on Tuesday.  Pt. Also told if bleeding gets worse over w/e, she needs to go to the ED.  Pt verbalized understanding.  Manie Bealer, Darlyne Russian, CMA

## 2012-11-14 ENCOUNTER — Ambulatory Visit: Payer: Self-pay | Admitting: Family Medicine

## 2012-11-21 ENCOUNTER — Encounter: Payer: Self-pay | Admitting: Family Medicine

## 2012-11-21 ENCOUNTER — Ambulatory Visit (INDEPENDENT_AMBULATORY_CARE_PROVIDER_SITE_OTHER): Payer: Medicaid Other | Admitting: Family Medicine

## 2012-11-21 VITALS — BP 110/76 | HR 72 | Ht 64.0 in | Wt 143.0 lb

## 2012-11-21 DIAGNOSIS — M79674 Pain in right toe(s): Secondary | ICD-10-CM

## 2012-11-21 DIAGNOSIS — M79676 Pain in unspecified toe(s): Secondary | ICD-10-CM | POA: Insufficient documentation

## 2012-11-21 DIAGNOSIS — M79609 Pain in unspecified limb: Secondary | ICD-10-CM

## 2012-11-21 MED ORDER — TROLAMINE SALICYLATE 10 % EX CREA: TOPICAL_CREAM | CUTANEOUS | Status: DC | PRN

## 2012-11-21 NOTE — Patient Instructions (Addendum)
For the toe pain, you can massage aspercreme on the bottom of your foot. Take tylenol no more than one every other day.  If the pain doesn't go away in 2 weeks, please follow up with me.

## 2012-11-21 NOTE — Progress Notes (Signed)
Patient ID: [name]    DOB: 08-24-1975, 37 y.o.   MRN: [id number] --- Subjective:  [name] is a 37 y.o.female who presents for evaluation of right toe pain.  - toe pain: started 2 weeks ago. Not associated with injury or increased activity or change in shoes. Says she was bitten by a mosquito. Started noticing pain with bending of 4th left toe. Sharp pain, intermittent. No numbness, no tingling. Worst at night. Able to walk with some discomfort. Has not tried anything for it.    ROS: see HPI Past Medical History: reviewed and updated medications and allergies. Social History: Tobacco: none  Objective: Filed Vitals:   11/21/12 1031  BP: 110/76  Pulse: 72    Physical Examination:   General appearance - alert, well appearing, and in no distress Foot: right: no obvious flat foot, no morton's, normal range of motion of all toes including fourth.  Tenderness to palpation along plantar aspect at base of 4th metatarsal. No dorsal pain. No soft tissue swelling, no erythema, no warmth.

## 2012-11-21 NOTE — Assessment & Plan Note (Signed)
Upon review of patient's records, it appears that she wa sfit for orthotics at sports medicine in 2012 for foot pain.  I suspect a component of metatarsalgia. Likely not stress fracture, so will not obtain xray at this time.  Will treat symptomatically. Unfortunately, ibuprofen is not an option due to stomach ulcer, tylenol can only be used sporadically due to primary biliary cirrhosis and tramadol is contraindicated with her h/o seizures.  Patient knows to take tylenol sporadically. Will use aspercreme for now. Patient to return in 2 weeks. If no better, she would probably benefit from orthotics. Patient has good shoe wear, but may benefit from extra support.

## 2012-12-16 ENCOUNTER — Other Ambulatory Visit: Payer: Self-pay | Admitting: *Deleted

## 2012-12-16 MED ORDER — CALCITRIOL 0.25 MCG PO CAPS: 0.2500 ug | ORAL_CAPSULE | Freq: Every morning | ORAL | Status: DC

## 2012-12-18 ENCOUNTER — Telehealth: Payer: Self-pay | Admitting: Family Medicine

## 2012-12-18 NOTE — Telephone Encounter (Signed)
Pt reports that her cycle has been continuous since 7/15 - "it will slow down and start spotting and then it will start up again" - appointment made for Monday. Wyatt Haste, RN-BSN

## 2012-12-18 NOTE — Telephone Encounter (Signed)
Pt is requesting that the nurse call her to discuss her period that started 7/15 and she is still on it 7/24. She wants to know what she should do. JW

## 2012-12-22 ENCOUNTER — Ambulatory Visit: Payer: Self-pay | Admitting: Family Medicine

## 2012-12-23 ENCOUNTER — Ambulatory Visit: Payer: Self-pay | Admitting: Family Medicine

## 2012-12-29 ENCOUNTER — Telehealth: Payer: Self-pay | Admitting: Family Medicine

## 2012-12-29 ENCOUNTER — Other Ambulatory Visit: Payer: Self-pay | Admitting: *Deleted

## 2012-12-29 DIAGNOSIS — D649 Anemia, unspecified: Secondary | ICD-10-CM

## 2012-12-29 MED ORDER — FERROUS SULFATE 325 (65 FE) MG PO TABS: 325.0000 mg | ORAL_TABLET | Freq: Two times a day (BID) | ORAL | Status: DC

## 2012-12-29 NOTE — Telephone Encounter (Signed)
Patient is calling wanting to speak to the nurse about her bowels being loose yesterday and today and she is feeling weak in her legs.  She has an appt on 8/6.

## 2012-12-29 NOTE — Telephone Encounter (Signed)
Pt reports loose bowels since Saturday and is feeling weak. Instructed to eat only BRAT  - pt verbalized understanding. Go to UC if it gets worse. - pt verbalized understanding.

## 2012-12-30 NOTE — Telephone Encounter (Signed)
Called pt to see how she was feeling - left message to call if we were needed. Wyatt Haste, RN-BSN

## 2012-12-31 ENCOUNTER — Ambulatory Visit: Payer: Self-pay | Admitting: Family Medicine

## 2013-01-13 ENCOUNTER — Ambulatory Visit: Payer: Self-pay | Admitting: Nurse Practitioner

## 2013-01-14 ENCOUNTER — Ambulatory Visit (INDEPENDENT_AMBULATORY_CARE_PROVIDER_SITE_OTHER): Payer: Medicaid Other | Admitting: Family Medicine

## 2013-01-14 ENCOUNTER — Encounter: Payer: Self-pay | Admitting: Family Medicine

## 2013-01-14 VITALS — BP 112/76 | HR 88 | Temp 98.6°F | Wt 147.0 lb

## 2013-01-14 DIAGNOSIS — K649 Unspecified hemorrhoids: Secondary | ICD-10-CM

## 2013-01-14 DIAGNOSIS — Z8719 Personal history of other diseases of the digestive system: Secondary | ICD-10-CM

## 2013-01-14 MED ORDER — HYDROCORTISONE ACETATE 25 MG RE SUPP: 25.0000 mg | Freq: Two times a day (BID) | RECTAL | Status: DC

## 2013-01-14 NOTE — Patient Instructions (Addendum)
You have a small hemorrhoid but I did not see any obvious bleeding. However, your stool card was positive for blood. - Continue using miralax, and I am prescribing anusol suppository. Try in stead of preparation H. - See Dr. Dulce Sellar to discuss the hemorrhoids/rectal bleeding. - We are checking a blood count and thyroid studies today.  Make an appointment to see Dr. Gwenlyn Saran when she is next available for your physical form. I will put it in her box.  I will give you a note saying you have mental retardation which may prevent you from being able to do jury duty, per your request.  Take care, and seek immediate help by calling us or 911 if you have severe bleeding, pass out, or have other major issues.  Hemorrhoids Hemorrhoids are puffy (swollen) veins around the rectum or anus. Hemorrhoids can cause pain, itching, bleeding, or irritation. HOME CARE  Eat foods with fiber, such as whole grains, beans, nuts, fruits, and vegetables. Ask your doctor about taking products with added fiber in them (fibersupplements).  Drink enough fluid to keep your pee (urine) clear or pale yellow.  Exercise often.  Go to the bathroom when you have the urge to poop. Do not wait.  Avoid straining to poop (bowel movement).  Keep the butt area dry and clean. Use wet toilet paper or moist paper towels.  Medicated creams and medicine inserted into the anus (anal suppository) may be used or applied as told.  Only take medicine as told by your doctor.  Take a warm water bath (sitz bath) for 15 20 minutes to ease pain. Do this 3 4 times a day.  Place ice packs on the area if it is tender or puffy. Use the ice packs between the warm water baths.  Put ice in a plastic bag.  Place a towel between your skin and the bag.  Leave the ice on for 15-20 minutes, 3-4 times a day.  Do not use a donut-shaped pillow or sit on the toilet for a long time. GET HELP RIGHT AWAY IF:   You have more pain that is not controlled  by treatment or medicine.  You have bleeding that will not stop.  You have trouble or are unable to poop (bowel movement).  You have pain or puffiness outside the area of the hemorrhoids. MAKE SURE YOU:   Understand these instructions.  Will watch your condition.  Will get help right away if you are not doing well or get worse. Document Released: 02/21/2008 Document Revised: 04/30/2012 Document Reviewed: 03/25/2012 [facility] Patient Information 2014 [place], [place].

## 2013-01-14 NOTE — Assessment & Plan Note (Signed)
With small visualized external hemorrhoid, severe pain, and reported heavy rectal bleeding with positive stool card, likely a bleeding hemorrhoid. - Recommended f/u with Dr. Dulce Sellar, who pt states she has seen recently. - Continue miralax, try anusol, stop preparation H - Check CBC and TSH today with reported fatigue - F/u with PCP - Return precautions reviewed

## 2013-01-14 NOTE — Progress Notes (Signed)
Patient ID: [name], female   DOB: 14-Jul-1975, 37 y.o.   MRN: [id number]  Subjective:   CC: Rectal bleeding from hemorrhoid   HPI:   History is from pt with mental retardation who comes to visit alone.  1. Hemorrhoid -  Patient does have rectal bleeding occurring 3 times per day off and on x 3 months, though she has chronic hemorrhoids. Bleeding is described as heavy. Patient is having severe pain with bowel movements and itching. Patient denies a personal history of colon cancer. Patient denies a personal history of IBD. Preparation H does not work. Pt takes miralax which helps with BMs but still thinks she may be constipated. Pt has seen Dr. Dulce Sellar last month. Reports fatigue.  2. Jury duty - Pt has summons for jury duty. She does not wish to go.   Review of Systems - Per HPI.  PMH: Denies h/o cardiovascular or renal issues Has h/o rectal bleeding    Objective:  Physical Exam BP 112/76  Pulse 88  Temp(Src) 98.6 F (37 C) (Oral)  Wt 147 lb (66.679 kg)  BMI 25.22 kg/m2  LMP 11/25/2012 GEN: NAD HEENT: Atraumatic, normocephalic, neck supple, EOMI, sclera clear  PULM: normal effort ABD: Soft, nondistended SKIN: No rash or cyanosis; warm and well-perfused EXTR: No lower extremity edema or calf tenderness PSYCH: Mood and affect euthymic, normal rate and volume of speech NEURO: Awake, alert, no focal deficits grossly, slowed speech baseline, mental retardation Rectal: Normal tone, single stool card positive, small external hemorrhoid visualized with no active gross bleeding seen, anoscopy not performed  Assessment:     [name] is a 37 y.o. female with h/o rectal bleeding and reported chronic h/o hemorrhoids here for rectal bleeding and pain    Plan:     # See problem list for problem-specific plans. - Pt brought physical form that is needed by adult daycare. F/u with Dr. Gwenlyn Saran, PCP. Placed in her box.  - Note provided to patient stating she has mental retardation  which may prevent jury duty.

## 2013-01-15 LAB — CBC
HCT: 31.3 % — ABNORMAL LOW (ref 36.0–46.0)
MCH: 29.8 pg (ref 26.0–34.0)
MCHC: 35.5 g/dL (ref 30.0–36.0)
MCV: 83.9 fL (ref 78.0–100.0)
RDW: 17.7 % — ABNORMAL HIGH (ref 11.5–15.5)

## 2013-01-16 ENCOUNTER — Encounter: Payer: Self-pay | Admitting: Family Medicine

## 2013-01-19 ENCOUNTER — Telehealth: Payer: Self-pay | Admitting: Family Medicine

## 2013-01-19 NOTE — Telephone Encounter (Signed)
[facility] Medical Examination Report form filled out and faxed to [phone]    Medication List       This list is accurate as of: 01/19/13  6:08 PM.  Always use your most recent med list.               albuterol 90 MCG/ACT inhaler  Commonly known as:  PROVENTIL,VENTOLIN  Inhale 2 puffs into the lungs every 4 (four) hours as needed for wheezing.     ammonium lactate 12 % cream  Commonly known as:  LAC-HYDRIN  Apply topically as needed for dry skin.     ASTEPRO 0.15 % Soln  Generic drug:  Azelastine HCl  Place 1 spray into the nose daily. Uses 1 spray in both nostrils     budesonide-formoterol 160-4.5 MCG/ACT inhaler  Commonly known as:  SYMBICORT  Inhale 2 puffs into the lungs 2 (two) times daily.     calcitRIOL 0.25 MCG capsule  Commonly known as:  ROCALTROL  Take 1 capsule (0.25 mcg total) by mouth every morning.     camphor-menthol lotion  Commonly known as:  SARNA  Apply topically as needed for itching.     cholecalciferol 1000 UNITS tablet  Commonly known as:  VITAMIN D  Take 2,000 Units by mouth daily.     cholestyramine light 4 G packet  Commonly known as:  PREVALITE  Take 8 g by mouth 2 (two) times daily.     COD LIVER OIL PO  Take 1 capsule by mouth daily.     ferrous sulfate 325 (65 FE) MG tablet  Take 1 tablet (325 mg total) by mouth 2 (two) times daily with a meal.     fluticasone 50 MCG/ACT nasal spray  Commonly known as:  FLONASE  Place 2 sprays into the nose daily.     GENTEAL 0.25-0.3 % Gel  Generic drug:  Carboxymethylcell-Hypromellose  Apply 1 drop to eye at bedtime.     hydrocortisone 25 MG suppository  Commonly known as:  ANUSOL-HC  Place 1 suppository (25 mg total) rectally 2 (two) times daily.     Lacosamide 100 MG Tabs  Take 2 tablets (200 mg total) by mouth 2 (two) times daily.     levETIRAcetam 500 MG tablet  Commonly known as:  KEPPRA  Take 500-1,000 mg by mouth 2 (two) times daily. Patient takes 1 tablet  in the morning and 2 tablets at night     lurasidone 40 MG Tabs tablet  Commonly known as:  LATUDA  Take 40 mg by mouth at bedtime.     montelukast 10 MG tablet  Commonly known as:  SINGULAIR  Take 1 tablet (10 mg total) by mouth daily.     multivitamin tablet  Take 1 tablet by mouth daily.     omeprazole 40 MG capsule  Commonly known as:  PRILOSEC  Take 40 mg by mouth daily.     polyethylene glycol packet  Commonly known as:  MIRALAX / GLYCOLAX  Take 17 g by mouth daily as needed (for constipation). In water or juice     REFRESH TEARS 0.5 % Soln  Generic drug:  carboxymethylcellulose  Place 1 drop into both eyes 4 (four) times daily as needed (for dry eyes).     sodium chloride 0.65 % nasal spray  Commonly known as:  OCEAN NASAL SPRAY  Place 1 spray into the nose as needed for congestion.     triamcinolone 0.025 % ointment  Commonly known as:  KENALOG  Apply 1 application topically 2 (two) times daily.     trolamine salicylate 10 % cream  Commonly known as:  ASPERCREME/ALOE  Apply topically as needed.     ursodiol 500 MG tablet  Commonly known as:  ACTIGALL  Take 500 mg by mouth 2 (two) times daily.

## 2013-01-20 ENCOUNTER — Telehealth: Payer: Self-pay | Admitting: Family Medicine

## 2013-01-20 NOTE — Telephone Encounter (Signed)
Returned call to pt and informed her that it was ol to put aspercreame in other places as well. Wyatt Haste, RN-BSN

## 2013-01-20 NOTE — Telephone Encounter (Signed)
Patient forgot to mention shoulder/knec pain and pain under her arm when she was in the other day.  She has Aspercreme that she has been putting on her toe and she wants to know if it is ok to put on her shoulder, kneck and under her arm.

## 2013-01-30 ENCOUNTER — Other Ambulatory Visit: Payer: Self-pay | Admitting: *Deleted

## 2013-01-30 DIAGNOSIS — E559 Vitamin D deficiency, unspecified: Secondary | ICD-10-CM

## 2013-02-02 ENCOUNTER — Ambulatory Visit (INDEPENDENT_AMBULATORY_CARE_PROVIDER_SITE_OTHER): Payer: Medicaid Other | Admitting: Family Medicine

## 2013-02-02 ENCOUNTER — Encounter: Payer: Self-pay | Admitting: Family Medicine

## 2013-02-02 VITALS — BP 105/71 | HR 78 | Ht 64.0 in | Wt 148.7 lb

## 2013-02-02 DIAGNOSIS — M25519 Pain in unspecified shoulder: Secondary | ICD-10-CM

## 2013-02-02 DIAGNOSIS — M25512 Pain in left shoulder: Secondary | ICD-10-CM

## 2013-02-02 MED ORDER — DICLOFENAC SODIUM 1 % TD GEL: 2.0000 g | Freq: Four times a day (QID) | TRANSDERMAL | Status: DC

## 2013-02-02 NOTE — Progress Notes (Signed)
Patient ID: [name]    DOB: Sep 26, 1975, 37 y.o.   MRN: [id number] --- Subjective:  [name] is a 37 y.o.female who presents for left shoulder pain.   - left shoulder pain: located throughout shoulder. Started in June. Has gotten worst, lasts all day. Worst with raising her arms and putting her arm behind her back into her pocket. Pain is a sharp pain that is located throughout her shoulder and under her arm. She has tried taking aspercreme and tylenol which has not helped. She denies any weakness in the arms from it. She doesn't recall any trauma to the shoulder or arm when this first started.   - day care paperwork: inquires as to whether this has been filled out.   ROS: see HPI Past Medical History: reviewed and updated medications and allergies. Social History: Tobacco: none  Objective: Filed Vitals:   02/02/13 1020  BP: 105/71  Pulse: 78    Physical Examination:   General appearance - alert, well appearing, and in no distress Shoulder - normal range of motion, raising arms 180 degrees above head, pain with internal rotation and adduction (reaching behind to back pocket) and positive Apley's. Normal strength with internal and external rotation, but reproducible pain with this. Pain without weakness with empty can test Diffuse tenderness throughout shoulder. No soft tissue swelling, bruising or joint effusion.

## 2013-02-02 NOTE — Patient Instructions (Addendum)
For the shoulder, I am referring you to physical therapy.  You can apply the new gel I am sending to the pharmacy, called voltaren gel.  Also, you can try ice to the area to see if that helps.  If between the new cream and physical therapy, you continue to have pain, come back to the clinic.

## 2013-02-03 ENCOUNTER — Other Ambulatory Visit: Payer: Medicaid Other

## 2013-02-03 ENCOUNTER — Other Ambulatory Visit: Payer: Self-pay | Admitting: Endocrinology

## 2013-02-03 DIAGNOSIS — M25512 Pain in left shoulder: Secondary | ICD-10-CM | POA: Insufficient documentation

## 2013-02-03 DIAGNOSIS — E559 Vitamin D deficiency, unspecified: Secondary | ICD-10-CM

## 2013-02-03 NOTE — Assessment & Plan Note (Addendum)
Appears to be a component of rotator cuff tendinopathy. She has conserved strength making a tear less likely but does still remain on the differential.  - since this is the first time she is evaluated for this, will try treating conservatively with topical NSAIDs, ice and exercise program. Would ideally have liked for her to get physical therapy but this is no longer covered by her insurance.  - if this fails to improve symptoms, may consider steroid injection, although this is not greatly favored in setting of her primary biliary cirrhosis. May need to touch base with Dr. Dulce Sellar to see if it would be safe to do this.

## 2013-02-05 ENCOUNTER — Encounter: Payer: Self-pay | Admitting: Endocrinology

## 2013-02-05 ENCOUNTER — Ambulatory Visit (INDEPENDENT_AMBULATORY_CARE_PROVIDER_SITE_OTHER): Payer: Medicaid Other | Admitting: Endocrinology

## 2013-02-05 VITALS — BP 98/60 | HR 88 | Temp 97.8°F | Ht 59.5 in | Wt 148.9 lb

## 2013-02-05 DIAGNOSIS — E559 Vitamin D deficiency, unspecified: Secondary | ICD-10-CM

## 2013-02-05 DIAGNOSIS — M6281 Muscle weakness (generalized): Secondary | ICD-10-CM

## 2013-02-05 LAB — CALCIUM: Calcium: 8.2 mg/dL — ABNORMAL LOW (ref 8.4–10.5)

## 2013-02-05 NOTE — Progress Notes (Signed)
Patient ID: [name], female   DOB: 08-20-1975, 37 y.o.   MRN: [id number]  Chief complaint: Followup  History of Present Illness:  She has had a history of marked vitamin D deficiency and mild hypocalcemia associated with symptoms of periodic muscle cramps She also has had some nonspecific leg weakness especially on the right side. Despite her taking her Lantus 10,000 units of vitamin D 3 daily her vitamin D levels have not been improved. Also was tried on vitamin D2, 50,000 units weekly but she could not afford this. Because of her continued symptoms and low vitamin D level she was given a trial of 0.25 mcg of calcitriol on her last visit She says her muscle cramps are now better and she is not having them there also does not think she is as weak.  She has questionable malabsorption with history of diarrhea. However she is not having constipation and her cholestyramine was stopped by her gastroenterologist She also has a history of primary biliary cirrhosis for which she was taking Questran     Medication List       This list is accurate as of: 02/05/13 10:24 AM.  Always use your most recent med list.               albuterol 90 MCG/ACT inhaler  Commonly known as:  PROVENTIL,VENTOLIN  Inhale 2 puffs into the lungs every 4 (four) hours as needed for wheezing.     ammonium lactate 12 % cream  Commonly known as:  LAC-HYDRIN  Apply topically as needed for dry skin.     ASTEPRO 0.15 % Soln  Generic drug:  Azelastine HCl  Place 1 spray into the nose daily. Uses 1 spray in both nostrils     budesonide-formoterol 160-4.5 MCG/ACT inhaler  Commonly known as:  SYMBICORT  Inhale 2 puffs into the lungs 2 (two) times daily.     calcitRIOL 0.25 MCG capsule  Commonly known as:  ROCALTROL  Take 1 capsule (0.25 mcg total) by mouth every morning.     camphor-menthol lotion  Commonly known as:  SARNA  Apply topically as needed for itching.     cholecalciferol 1000 UNITS tablet   Commonly known as:  VITAMIN D  Take 2,000 Units by mouth daily.     cholestyramine light 4 G packet  Commonly known as:  PREVALITE  Take 8 g by mouth 2 (two) times daily.     COD LIVER OIL PO  Take 1 capsule by mouth daily.     diclofenac sodium 1 % Gel  Commonly known as:  VOLTAREN  Apply 2 g topically 4 (four) times daily. Apply to shoulder     ferrous sulfate 325 (65 FE) MG tablet  Take 1 tablet (325 mg total) by mouth 2 (two) times daily with a meal.     fluticasone 50 MCG/ACT nasal spray  Commonly known as:  FLONASE  Place 2 sprays into the nose daily.     GENTEAL 0.25-0.3 % Gel  Generic drug:  Carboxymethylcell-Hypromellose  Apply 1 drop to eye at bedtime.     hydrocortisone 25 MG suppository  Commonly known as:  ANUSOL-HC  Place 1 suppository (25 mg total) rectally 2 (two) times daily.     Lacosamide 100 MG Tabs  Take 2 tablets (200 mg total) by mouth 2 (two) times daily.     levETIRAcetam 500 MG tablet  Commonly known as:  KEPPRA  Take 500-1,000 mg by mouth 2 (two) times daily. Patient  takes 1 tablet in the morning and 2 tablets at night     lurasidone 40 MG Tabs tablet  Commonly known as:  LATUDA  Take 40 mg by mouth at bedtime.     montelukast 10 MG tablet  Commonly known as:  SINGULAIR  Take 1 tablet (10 mg total) by mouth daily.     multivitamin tablet  Take 1 tablet by mouth daily.     omeprazole 40 MG capsule  Commonly known as:  PRILOSEC  Take 40 mg by mouth daily.     polyethylene glycol packet  Commonly known as:  MIRALAX / GLYCOLAX  Take 17 g by mouth daily as needed (for constipation). In water or juice     REFRESH TEARS 0.5 % Soln  Generic drug:  carboxymethylcellulose  Place 1 drop into both eyes 4 (four) times daily as needed (for dry eyes).     sodium chloride 0.65 % nasal spray  Commonly known as:  OCEAN NASAL SPRAY  Place 1 spray into the nose as needed for congestion.     triamcinolone 0.025 % ointment  Commonly known as:   KENALOG  Apply 1 application topically 2 (two) times daily.     trolamine salicylate 10 % cream  Commonly known as:  ASPERCREME/ALOE  Apply topically as needed.     ursodiol 500 MG tablet  Commonly known as:  ACTIGALL  Take 500 mg by mouth 2 (two) times daily.        Allergies:  Allergies  Allergen Reactions  . Aspirin Nausea And Vomiting    Due to Stomach ulcer  . Penicillins Itching and Swelling    Breakout   . Pseudoephedrine Itching and Swelling    Tongue swelling  . Sulfa Antibiotics Nausea And Vomiting  . Tramadol Hcl Itching and Nausea And Vomiting    REACTION: seizures  . Latex Itching and Rash    breakout    Past Medical History  Diagnosis Date  . Allergy   . Anemia   . Intellectual disability   . Constipation   . Abnormal vaginal bleeding   . Biliary cirrhosis 04/26/11  . 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 DUB (dysfunctional uterine bleeding)   . PONV (postoperative nausea and vomiting)   . Seizures     congenital epilepsy-hx. seizures  . Asthma 09-23-12    Asthma somewhat causing increase wheezing and mild congestion at present  . GERD (gastroesophageal reflux disease)   . Arthritis     right knee    Past Surgical History  Procedure Laterality Date  . Appendectomy    . Cholecystectomy    . Eye muscle surgery    . Eustacean tubes    . Colonoscopy w/ biopsies    . Adenonoidectomy    . Esophagogastroduodenoscopy (egd) with propofol N/A 10/01/2012    Procedure: ESOPHAGOGASTRODUODENOSCOPY (EGD) WITH PROPOFOL;  Surgeon: Willis Modena, MD;  Location: [facility];  Service: Endoscopy;  Laterality: N/A;    Family History  Problem Relation Age of Onset  . Diabetes Mother   . Heart disease Mother   . COPD Mother     Social History:  reports that she has never smoked. She has never used smokeless tobacco. She reports that she does not drink alcohol or use illicit drugs.  Review of Systems   She has history of anemia History of mental  retardation She is on medications for schizophrenia and depression    EXAM:  BP 98/60  Pulse 88  Temp(Src) 97.8 F (  36.6 C) (Oral)  Ht 4' 11.5" (1.511 m)  Wt 148 lb 14.4 oz (67.541 kg)  BMI 29.58 kg/m2  SpO2 99%  LMP 01/23/2013  Assessment/Plan:   VITAMIN D deficiency of unclear etiology. She does have a history of diarrhea but may be having difficulty activating her vitamin D because of her biliary cirrhosis.  She has had difficulty getting therapeutic levels of vitamin D 3 despite large doses of supplements which she reportedly had been compliant with.  Subjectively she is feeling better with starting calcitriol and she is taking this daily. Vitamin D level is low as expected again. Will check her calcium and adjust calcitriol as needed   Reeder Brisby 02/05/2013, 10:24 AM   Addendum: Labs show calcium 8.2, will increase her calcitriol to 0.5 mcg daily and have her followup with full chemistry panel  Appointment on 02/03/2013  Component Date Value Range Status  . Vit D, 25-Hydroxy 02/03/2013 <10* 30 - 89 ng/mL Final   Comment: Result repeated and verified.                          This assay accurately quantifies Vitamin D, which is the sum of the                          25-Hydroxy forms of Vitamin D2 and D3.  Studies have shown that the                          optimum concentration of 25-Hydroxy Vitamin D is 30 ng/mL or higher.                           Concentrations of Vitamin D between 20 and 29 ng/mL are considered to                          be insufficient and concentrations less than 20 ng/mL are considered                          to be deficient for Vitamin D.  Orders Only on 02/03/2013  Component Date Value Range Status  . Calcium 02/03/2013 8.2* 8.4 - 10.5 mg/dL Final

## 2013-02-05 NOTE — Patient Instructions (Signed)
Vitamin D3, 5000 units daily  Continue calcitriol

## 2013-02-09 ENCOUNTER — Ambulatory Visit (INDEPENDENT_AMBULATORY_CARE_PROVIDER_SITE_OTHER): Payer: Medicaid Other | Admitting: Nurse Practitioner

## 2013-02-09 ENCOUNTER — Encounter: Payer: Self-pay | Admitting: Nurse Practitioner

## 2013-02-09 ENCOUNTER — Telehealth: Payer: Self-pay | Admitting: *Deleted

## 2013-02-09 VITALS — BP 111/68 | HR 85 | Ht 60.0 in | Wt 147.0 lb

## 2013-02-09 DIAGNOSIS — R569 Unspecified convulsions: Secondary | ICD-10-CM

## 2013-02-09 MED ORDER — LEVETIRACETAM 500 MG PO TABS: ORAL_TABLET | ORAL | Status: DC

## 2013-02-09 NOTE — Patient Instructions (Addendum)
Pt to continue Vimpat as directed Pt to continue Keppra as directed F/U yearly and prn

## 2013-02-09 NOTE — Progress Notes (Signed)
[facility]  PATIENT: [name] DOB: [name] 28, 1976   REASON FOR VISIT: Seizure disorder   HISTORY OF PRESENT ILLNESS:[name], 37 year old black female returns today for followup. She was last seen 06/05/2012.She has a history of mild mental retardation, and long-standing history of seizures. Her last seizure activity was 2005. She was  well-controlled on Depakote and Tegretol extended release.  she was recently diagnosed with primary biliary cirrhosis  Antieptical medication was changed since last visit in February 2013, she is now taking Vimpat 100 mg 2 tabs twice a day, Keppra 500 mg 3 tabs daily.  She lives alone, uncles lives next door, she attends adult daycare 3x week.She is taking Vimpat and Keppra without side effects.  She has blurred vision , denies weakness. Continues with dry skin. She says she is to be evaluated by a liver specialist at [facility]    REVIEW OF SYSTEMS: Full 14 system review of systems performed and notable only for:  Constitutional: N/A  Cardiovascular: N/A  Ear/Nose/Throat: N/A  Skin: N/A  Eyes: N/A  Respiratory: Cough Gastroitestinal: N/A  Hematology/Lymphatic: N/A  Endocrine: N/A Musculoskeletal: Joint pain  Allergy/Immunology: N/A  Neurological: Weakness  Psychiatric: Not enough sleep  ALLERGIES: Allergies  Allergen Reactions  . Aspirin Nausea And Vomiting    Due to Stomach ulcer  . Penicillins Itching and Swelling    Breakout   . Pseudoephedrine Itching and Swelling    Tongue swelling  . Sulfa Antibiotics Nausea And Vomiting  . Tramadol Hcl Itching and Nausea And Vomiting    REACTION: seizures  . Latex Itching and Rash    breakout    HOME MEDICATIONS: Outpatient Prescriptions Prior to Visit  Medication Sig Dispense Refill  . albuterol (PROVENTIL,VENTOLIN) 90 MCG/ACT inhaler Inhale 2 puffs into the lungs every 4 (four) hours as needed for wheezing.       Marland Kitchen ammonium lactate (LAC-HYDRIN) 12 % cream Apply topically  as needed for dry skin.  385 g  0  . Azelastine HCl (ASTEPRO) 0.15 % SOLN Place 1 spray into the nose daily. Uses 1 spray in both nostrils      . budesonide-formoterol (SYMBICORT) 160-4.5 MCG/ACT inhaler Inhale 2 puffs into the lungs 2 (two) times daily.       . calcitRIOL (ROCALTROL) 0.25 MCG capsule Take 1 capsule (0.25 mcg total) by mouth every morning.  30 capsule  3  . camphor-menthol (SARNA) lotion Apply topically as needed for itching.  222 mL  0  . Carboxymethylcell-Hypromellose (GENTEAL) 0.25-0.3 % GEL Apply 1 drop to eye at bedtime.      . carboxymethylcellulose (REFRESH TEARS) 0.5 % SOLN Place 1 drop into both eyes 4 (four) times daily as needed (for dry eyes).      . cholecalciferol (VITAMIN D) 1000 UNITS tablet Take 2,000 Units by mouth daily.      . COD LIVER OIL PO Take 1 capsule by mouth daily.      . diclofenac sodium (VOLTAREN) 1 % GEL Apply 2 g topically 4 (four) times daily. Apply to shoulder  100 g  0  . ferrous sulfate 325 (65 FE) MG tablet Take 1 tablet (325 mg total) by mouth 2 (two) times daily with a meal.  60 tablet  6  . fluticasone (FLONASE) 50 MCG/ACT nasal spray Place 2 sprays into the nose daily.      . hydrocortisone (ANUSOL-HC) 25 MG suppository Place 1 suppository (25 mg total) rectally 2 (two) times daily.  12 suppository  0  .  Lacosamide 100 MG TABS Take 2 tablets (200 mg total) by mouth 2 (two) times daily.  120 tablet  12  . lurasidone (LATUDA) 40 MG TABS Take 40 mg by mouth 2 (two) times daily. hs      . montelukast (SINGULAIR) 10 MG tablet Take 1 tablet (10 mg total) by mouth daily.  30 tablet  12  . Multiple Vitamin (MULTIVITAMIN) tablet Take 1 tablet by mouth daily.      Marland Kitchen omeprazole (PRILOSEC) 40 MG capsule Take 40 mg by mouth daily.      . polyethylene glycol (MIRALAX / GLYCOLAX) packet Take 17 g by mouth daily as needed (for constipation). In water or juice      . sodium chloride (OCEAN NASAL SPRAY) 0.65 % nasal spray Place 1 spray into the nose as  needed for congestion.  30 mL  12  . triamcinolone (KENALOG) 0.025 % ointment Apply 1 application topically 2 (two) times daily.      Marland Kitchen trolamine salicylate (ASPERCREME/ALOE) 10 % cream Apply topically as needed.  85 g  0  . ursodiol (ACTIGALL) 500 MG tablet Take 500 mg by mouth 2 (two) times daily.      Marland Kitchen levETIRAcetam (KEPPRA) 500 MG tablet Take 500-1,000 mg by mouth 2 (two) times daily. Patient takes 1 tablet in the morning and 2 tablets at night       No facility-administered medications prior to visit.    PAST MEDICAL HISTORY: Past Medical History  Diagnosis Date  . Allergy   . Anemia   . Intellectual disability   . Constipation   . Abnormal vaginal bleeding   . Biliary cirrhosis 04/26/11  . 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 DUB (dysfunctional uterine bleeding)   . PONV (postoperative nausea and vomiting)   . Seizures     congenital epilepsy-hx. seizures  . Asthma 09-23-12    Asthma somewhat causing increase wheezing and mild congestion at present  . GERD (gastroesophageal reflux disease)   . Arthritis     right knee    PAST SURGICAL HISTORY: Past Surgical History  Procedure Laterality Date  . Appendectomy    . Cholecystectomy    . Eye muscle surgery    . Eustacean tubes    . Colonoscopy w/ biopsies    . Adenonoidectomy    . Esophagogastroduodenoscopy (egd) with propofol N/A 10/01/2012    Procedure: ESOPHAGOGASTRODUODENOSCOPY (EGD) WITH PROPOFOL;  Surgeon: Willis Modena, MD;  Location: [facility];  Service: Endoscopy;  Laterality: N/A;    FAMILY HISTORY: Family History  Problem Relation Age of Onset  . Diabetes Mother   . Heart disease Mother   . COPD Mother     SOCIAL HISTORY: History   Social History  . Marital Status: Single    Spouse Name: N/A    Number of Children: N/A  . Years of Education: N/A   Occupational History  . Not on file.   Social History Main Topics  . Smoking status: Never Smoker   . Smokeless tobacco: Never Used  .  Alcohol Use: No  . Drug Use: No  . Sexual Activity: No   Other Topics Concern  . Not on file   Social History Narrative  Patient does not work, she has a 12th grade education, she is single lives alone  PHYSICAL EXAM  Filed Vitals:   02/09/13 1527  BP: 111/68  Pulse: 85  Height: 5' (1.524 m)  Weight: 147 lb (66.679 kg)   Body mass index is 28.71  kg/(m^2).  Generalized: Well developed, in no acute distress  Head: normocephalic and atraumatic,. Oropharynx benign  Neck: Supple, no carotid bruits  Cardiac: Regular rate rhythm, no murmur  Musculoskeletal: No deformity   Neurological examination   Mentation: Alert oriented to time, place, history taking. Follows all commands, hesitant with speech mild MR  Cranial nerve II-XII:Pupils were equal round reactive to light extraocular movements were full, visual field were full on confrontational test. Facial sensation and strength were normal. hearing was intact to finger rubbing bilaterally. Uvula tongue midline. head turning and shoulder shrug and were normal and symmetric.Tongue protrusion into cheek strength was normal. Motor: normal bulk and tone, full strength in the BUE, BLE, fine finger movements normal, no pronator drift. No focal weakness Coordination: finger-nose-finger, heel-to-shin bilaterally, no dysmetria Reflexes: Depressed and symmetric Gait and Station: Rising up from seated position without assistance, normal stance,  moderate stride, holds right arm and right elbow in flexion, smooth turning, able to perform tiptoe, and heel walking without difficulty.   DIAGNOSTIC DATA (LABS, IMAGING, TESTING) - I reviewed patient records, labs, notes, testing and imaging myself where available.  Lab Results  Component Value Date   WBC 4.0 01/14/2013   HGB 11.1* 01/14/2013   HCT 31.3* 01/14/2013   MCV 83.9 01/14/2013   PLT 99* 01/14/2013      Component Value Date/Time   NA 137 08/23/2012 1515   K 4.5 08/23/2012 1515   CL 103  08/23/2012 1515   CO2 25 08/23/2012 1515   GLUCOSE 99 08/23/2012 1515   BUN 8 08/23/2012 1515   CREATININE 0.52 08/23/2012 1515   CREATININE 0.33* 11/09/2011 1208   CALCIUM 8.2* 02/03/2013 0800   CALCIUM 9.0 09/11/2007 2301   PROT 8.2 08/23/2012 1515   ALBUMIN 3.3* 08/23/2012 1515   AST 126* 08/23/2012 1515   ALT 106* 08/23/2012 1515   ALKPHOS 161* 08/23/2012 1515   BILITOT 2.8* 08/23/2012 1515   GFRNONAA >90 08/23/2012 1515   GFRAA >90 08/23/2012 1515      Lab Results  Component Value Date   TSH 2.548 01/14/2013      ASSESSMENT AND PLAN  37 y.o. year old female  has a past medical history of Anemia; Intellectual disability; Constipation;  Biliary cirrhosis (04/26/11); Primary biliary cirrhosis (05/07/2011); DUB (dysfunctional uterine bleeding);  Seizures; Asthma (09-23-12); GERD (gastroesophageal reflux disease); and Arthritis. here with to followup for seizure disorder. No recent seizures  Pt to continue Vimpat as directed Pt to continue Keppra as directed, renewed  F/U yearly and prn  Nilda Riggs, [facility], [facility], APRN  [facility] [address], [address] [place], [place] [address] [phone]

## 2013-02-09 NOTE — Telephone Encounter (Signed)
Left message on voicemail.

## 2013-02-09 NOTE — Telephone Encounter (Signed)
Message copied by Hermenia Bers on Mon Feb 09, 2013  1:02 PM ------      Message from: [name]      Created: Fri Feb 06, 2013  8:07 AM       Calcium level still low, increased doses of calcitriol to 0.5 mcg, needs followup BMP and office visit in 3 weeks ------

## 2013-02-11 ENCOUNTER — Telehealth: Payer: Self-pay | Admitting: Endocrinology

## 2013-02-11 MED ORDER — CALCITRIOL 0.5 MCG PO CAPS: 0.5000 ug | ORAL_CAPSULE | Freq: Every day | ORAL | Status: DC

## 2013-02-11 NOTE — Telephone Encounter (Signed)
Pt give results, rx sent to pharmacy

## 2013-02-11 NOTE — Telephone Encounter (Signed)
Pt returning call to [facility] / Sherri

## 2013-02-17 ENCOUNTER — Telehealth: Payer: Self-pay | Admitting: Family Medicine

## 2013-02-17 NOTE — Telephone Encounter (Signed)
Patient calls stating that she has been having a combo between diarrhea & constipation. Would like to speak to a nurse.

## 2013-02-17 NOTE — Telephone Encounter (Signed)
Pt called back. Spoke with pt to clarify if she has constipation or diarrhea. After talking with pt, she has constipation. She takes the miralax twice a day. I advised her to increase her water intake. She denies nausea, pain. Rectal bleeding.  She has upcoming appt with Dr.Losq on Monday.  She will call back, if her symptoms get worse. [name], [name]

## 2013-02-17 NOTE — Telephone Encounter (Signed)
Called pt and LMVM to call us back and if she feels really bad to make an appt to be seen. Waiting for call back. [name], [name]

## 2013-02-23 ENCOUNTER — Ambulatory Visit: Payer: Medicaid Other | Admitting: Family Medicine

## 2013-02-26 ENCOUNTER — Other Ambulatory Visit (INDEPENDENT_AMBULATORY_CARE_PROVIDER_SITE_OTHER): Payer: Medicaid Other

## 2013-02-26 DIAGNOSIS — M6281 Muscle weakness (generalized): Secondary | ICD-10-CM

## 2013-02-26 DIAGNOSIS — E559 Vitamin D deficiency, unspecified: Secondary | ICD-10-CM

## 2013-02-26 LAB — COMPREHENSIVE METABOLIC PANEL
AST: 155 U/L — ABNORMAL HIGH (ref 0–37)
Albumin: 2.3 g/dL — ABNORMAL LOW (ref 3.5–5.2)
BUN: 9 mg/dL (ref 6–23)
CO2: 27 mEq/L (ref 19–32)
Calcium: 7.9 mg/dL — ABNORMAL LOW (ref 8.4–10.5)
Chloride: 103 mEq/L (ref 96–112)
Glucose, Bld: 100 mg/dL — ABNORMAL HIGH (ref 70–99)
Potassium: 3.9 mEq/L (ref 3.5–5.1)

## 2013-02-28 ENCOUNTER — Encounter (HOSPITAL_COMMUNITY): Payer: Self-pay | Admitting: Emergency Medicine

## 2013-02-28 ENCOUNTER — Inpatient Hospital Stay (HOSPITAL_COMMUNITY)
Admission: EM | Admit: 2013-02-28 | Discharge: 2013-03-02 | DRG: 948 | Disposition: A | Discharge status: Home Health | Payer: Medicaid Other | Attending: Family Medicine | Admitting: Family Medicine

## 2013-02-28 DIAGNOSIS — K7290 Hepatic failure, unspecified without coma: Secondary | ICD-10-CM

## 2013-02-28 DIAGNOSIS — K743 Primary biliary cirrhosis: Secondary | ICD-10-CM | POA: Diagnosis present

## 2013-02-28 DIAGNOSIS — F200 Paranoid schizophrenia: Secondary | ICD-10-CM | POA: Diagnosis present

## 2013-02-28 DIAGNOSIS — D61818 Other pancytopenia: Secondary | ICD-10-CM | POA: Diagnosis present

## 2013-02-28 DIAGNOSIS — D649 Anemia, unspecified: Secondary | ICD-10-CM | POA: Diagnosis present

## 2013-02-28 DIAGNOSIS — G40909 Epilepsy, unspecified, not intractable, without status epilepticus: Secondary | ICD-10-CM | POA: Diagnosis present

## 2013-02-28 DIAGNOSIS — Z79899 Other long term (current) drug therapy: Secondary | ICD-10-CM

## 2013-02-28 DIAGNOSIS — F79 Unspecified intellectual disabilities: Secondary | ICD-10-CM | POA: Diagnosis present

## 2013-02-28 DIAGNOSIS — R011 Cardiac murmur, unspecified: Secondary | ICD-10-CM | POA: Diagnosis present

## 2013-02-28 DIAGNOSIS — Z23 Encounter for immunization: Secondary | ICD-10-CM

## 2013-02-28 DIAGNOSIS — R791 Abnormal coagulation profile: Principal | ICD-10-CM | POA: Diagnosis present

## 2013-02-28 DIAGNOSIS — K745 Biliary cirrhosis, unspecified: Secondary | ICD-10-CM

## 2013-02-28 DIAGNOSIS — K7210 Chronic hepatic failure without coma: Secondary | ICD-10-CM

## 2013-02-28 DIAGNOSIS — K219 Gastro-esophageal reflux disease without esophagitis: Secondary | ICD-10-CM | POA: Diagnosis present

## 2013-02-28 DIAGNOSIS — J45909 Unspecified asthma, uncomplicated: Secondary | ICD-10-CM | POA: Diagnosis present

## 2013-02-28 DIAGNOSIS — E559 Vitamin D deficiency, unspecified: Secondary | ICD-10-CM | POA: Diagnosis present

## 2013-02-28 HISTORY — DX: Vitamin D deficiency, unspecified: E55.9

## 2013-02-28 LAB — CBC WITH DIFFERENTIAL/PLATELET
Eosinophils Absolute: 0.2 10*3/uL (ref 0.0–0.7)
Eosinophils Relative: 3 % (ref 0–5)
MCH: 29.1 pg (ref 26.0–34.0)
Monocytes Absolute: 0.5 10*3/uL (ref 0.1–1.0)
Neutrophils Relative %: 59 % (ref 43–77)
Platelets: 88 10*3/uL — ABNORMAL LOW (ref 150–400)
RBC: 3.57 MIL/uL — ABNORMAL LOW (ref 3.87–5.11)

## 2013-02-28 LAB — AMMONIA: Ammonia: 86 umol/L — ABNORMAL HIGH (ref 11–60)

## 2013-02-28 LAB — CBC
HCT: 24.4 % — ABNORMAL LOW (ref 36.0–46.0)
Hemoglobin: 9.3 g/dL — ABNORMAL LOW (ref 12.0–15.0)
MCHC: 38.1 g/dL — ABNORMAL HIGH (ref 30.0–36.0)
MCV: 77.7 fL — ABNORMAL LOW (ref 78.0–100.0)
RDW: 23.9 % — ABNORMAL HIGH (ref 11.5–15.5)

## 2013-02-28 LAB — COMPREHENSIVE METABOLIC PANEL
ALT: 95 U/L — ABNORMAL HIGH (ref 0–35)
AST: 180 U/L — ABNORMAL HIGH (ref 0–37)
Albumin: 2.3 g/dL — ABNORMAL LOW (ref 3.5–5.2)
CO2: 26 mEq/L (ref 19–32)
Calcium: 8.2 mg/dL — ABNORMAL LOW (ref 8.4–10.5)
Sodium: 136 mEq/L (ref 135–145)
Total Protein: 7.7 g/dL (ref 6.0–8.3)

## 2013-02-28 LAB — TYPE AND SCREEN
ABO/RH(D): O POS
Antibody Screen: NEGATIVE

## 2013-02-28 LAB — PROTIME-INR
INR: >10 (ref 0.00–1.49)
INR: >10 (ref 0.00–1.49)
Prothrombin Time: >90 seconds — ABNORMAL HIGH (ref 11.6–15.2)

## 2013-02-28 LAB — APTT: aPTT: 123 seconds — ABNORMAL HIGH (ref 24–37)

## 2013-02-28 LAB — ABO/RH: ABO/RH(D): O POS

## 2013-02-28 MED ORDER — PHYTONADIONE 5 MG PO TABS
10.0000 mg | ORAL_TABLET | Freq: Once | ORAL | Status: AC
  Administered 2013-02-28: 10 mg via ORAL
  Filled 2013-02-28: qty 2

## 2013-02-28 MED ORDER — HYDROCORTISONE ACETATE 25 MG RE SUPP
25.0000 mg | Freq: Two times a day (BID) | RECTAL | Status: DC
  Administered 2013-02-28 – 2013-03-02 (×4): 25 mg via RECTAL
  Filled 2013-02-28 (×6): qty 1

## 2013-02-28 MED ORDER — AMMONIUM LACTATE 12 % EX CREA: TOPICAL_CREAM | CUTANEOUS | Status: DC | PRN

## 2013-02-28 MED ORDER — SODIUM CHLORIDE 0.9 % IJ SOLN
3.0000 mL | Freq: Two times a day (BID) | INTRAMUSCULAR | Status: DC
  Administered 2013-02-28 – 2013-03-02 (×5): 3 mL via INTRAVENOUS

## 2013-02-28 MED ORDER — FERROUS SULFATE 325 (65 FE) MG PO TABS
325.0000 mg | ORAL_TABLET | Freq: Two times a day (BID) | ORAL | Status: DC
  Administered 2013-02-28 – 2013-03-02 (×5): 325 mg via ORAL
  Filled 2013-02-28 (×6): qty 1

## 2013-02-28 MED ORDER — LURASIDONE HCL 40 MG PO TABS
40.0000 mg | ORAL_TABLET | Freq: Every day | ORAL | Status: DC
  Filled 2013-02-28: qty 1

## 2013-02-28 MED ORDER — MONTELUKAST SODIUM 10 MG PO TABS
10.0000 mg | ORAL_TABLET | Freq: Every day | ORAL | Status: DC
  Administered 2013-03-01 – 2013-03-02 (×2): 10 mg via ORAL
  Filled 2013-02-28 (×2): qty 1

## 2013-02-28 MED ORDER — URSODIOL 500 MG PO TABS: 500.0000 mg | ORAL_TABLET | Freq: Two times a day (BID) | ORAL | Status: DC

## 2013-02-28 MED ORDER — POLYVINYL ALCOHOL 1.4 % OP SOLN
1.0000 [drp] | OPHTHALMIC | Status: DC | PRN
  Filled 2013-02-28: qty 15

## 2013-02-28 MED ORDER — TAB-A-VITE/IRON PO TABS: 1.0000 | ORAL_TABLET | Freq: Every day | ORAL | Status: DC

## 2013-02-28 MED ORDER — PNEUMOCOCCAL VAC POLYVALENT 25 MCG/0.5ML IJ INJ
0.5000 mL | INJECTION | INTRAMUSCULAR | Status: AC
  Administered 2013-03-01: 0.5 mL via INTRAMUSCULAR
  Filled 2013-02-28: qty 0.5

## 2013-02-28 MED ORDER — LEVETIRACETAM 500 MG PO TABS
1000.0000 mg | ORAL_TABLET | Freq: Every day | ORAL | Status: DC
  Administered 2013-02-28 – 2013-03-01 (×2): 1000 mg via ORAL
  Filled 2013-02-28 (×3): qty 2

## 2013-02-28 MED ORDER — LEVETIRACETAM 500 MG PO TABS
500.0000 mg | ORAL_TABLET | Freq: Every morning | ORAL | Status: DC
  Administered 2013-03-01 – 2013-03-02 (×2): 500 mg via ORAL
  Filled 2013-02-28 (×2): qty 1

## 2013-02-28 MED ORDER — ONE-DAILY MULTI VITAMINS PO TABS: 1.0000 | ORAL_TABLET | Freq: Every day | ORAL | Status: DC

## 2013-02-28 MED ORDER — ALBUTEROL SULFATE HFA 108 (90 BASE) MCG/ACT IN AERS: 2.0000 | INHALATION_SPRAY | RESPIRATORY_TRACT | Status: DC | PRN

## 2013-02-28 MED ORDER — LURASIDONE HCL 40 MG PO TABS
40.0000 mg | ORAL_TABLET | Freq: Every day | ORAL | Status: DC
  Administered 2013-02-28 – 2013-03-01 (×2): 40 mg via ORAL
  Filled 2013-02-28 (×3): qty 1

## 2013-02-28 MED ORDER — LEVETIRACETAM 500 MG PO TABS: 500.0000 mg | ORAL_TABLET | Freq: Two times a day (BID) | ORAL | Status: DC

## 2013-02-28 MED ORDER — LACOSAMIDE 100 MG PO TABS: 2.0000 | ORAL_TABLET | Freq: Two times a day (BID) | ORAL | Status: DC

## 2013-02-28 MED ORDER — CARBOXYMETHYLCELLULOSE SODIUM 0.5 % OP SOLN: 1.0000 [drp] | Freq: Four times a day (QID) | OPHTHALMIC | Status: DC | PRN

## 2013-02-28 MED ORDER — FLUTICASONE PROPIONATE 50 MCG/ACT NA SUSP
2.0000 | Freq: Every day | NASAL | Status: DC
  Administered 2013-03-01 – 2013-03-02 (×2): 2 via NASAL
  Filled 2013-02-28: qty 16

## 2013-02-28 MED ORDER — VITAMIN D3 25 MCG (1000 UNIT) PO TABS
2000.0000 [IU] | ORAL_TABLET | Freq: Every day | ORAL | Status: DC
  Administered 2013-03-01 – 2013-03-02 (×2): 2000 [IU] via ORAL
  Filled 2013-02-28 (×2): qty 2

## 2013-02-28 MED ORDER — ALBUTEROL 90 MCG/ACT IN AERS: 2.0000 | INHALATION_SPRAY | RESPIRATORY_TRACT | Status: DC | PRN

## 2013-02-28 MED ORDER — LACOSAMIDE 50 MG PO TABS: 200.0000 mg | ORAL_TABLET | Freq: Two times a day (BID) | ORAL | Status: DC

## 2013-02-28 MED ORDER — SALINE SPRAY 0.65 % NA SOLN
1.0000 | NASAL | Status: DC | PRN
  Filled 2013-02-28: qty 44

## 2013-02-28 MED ORDER — CALCITRIOL 0.5 MCG PO CAPS
0.5000 ug | ORAL_CAPSULE | Freq: Every day | ORAL | Status: DC
  Administered 2013-03-01 – 2013-03-02 (×2): 0.5 ug via ORAL
  Filled 2013-02-28 (×2): qty 1

## 2013-02-28 MED ORDER — DIPHENHYDRAMINE HCL 50 MG/ML IJ SOLN
50.0000 mg | Freq: Once | INTRAMUSCULAR | Status: AC
  Administered 2013-02-28: 50 mg via INTRAVENOUS

## 2013-02-28 MED ORDER — DIPHENHYDRAMINE HCL 50 MG/ML IJ SOLN
INTRAMUSCULAR | Status: AC
  Administered 2013-02-28: 50 mg via INTRAVENOUS
  Filled 2013-02-28: qty 1

## 2013-02-28 MED ORDER — BUDESONIDE-FORMOTEROL FUMARATE 160-4.5 MCG/ACT IN AERO
2.0000 | INHALATION_SPRAY | Freq: Two times a day (BID) | RESPIRATORY_TRACT | Status: DC
  Administered 2013-02-28 – 2013-03-01 (×2): 2 via RESPIRATORY_TRACT
  Filled 2013-02-28: qty 6

## 2013-02-28 MED ORDER — PANTOPRAZOLE SODIUM 40 MG PO TBEC
40.0000 mg | DELAYED_RELEASE_TABLET | Freq: Every day | ORAL | Status: DC
  Administered 2013-03-01 – 2013-03-02 (×2): 40 mg via ORAL
  Filled 2013-02-28 (×2): qty 1

## 2013-02-28 MED ORDER — POLYETHYLENE GLYCOL 3350 17 G PO PACK
17.0000 g | PACK | Freq: Every day | ORAL | Status: DC | PRN
  Filled 2013-02-28: qty 1

## 2013-02-28 MED ORDER — SALINE NASAL SPRAY 0.65 % NA SOLN: 1.0000 | NASAL | Status: DC | PRN

## 2013-02-28 MED ORDER — URSODIOL 60 MG/ML SUSP
500.0000 mg | Freq: Two times a day (BID) | ORAL | Status: DC
  Administered 2013-02-28 – 2013-03-02 (×4): 500 mg via ORAL
  Filled 2013-02-28 (×5): qty 16.7

## 2013-02-28 MED ORDER — ADULT MULTIVITAMIN W/MINERALS CH
1.0000 | ORAL_TABLET | Freq: Every day | ORAL | Status: DC
  Administered 2013-03-01 – 2013-03-02 (×2): 1 via ORAL
  Filled 2013-02-28 (×2): qty 1

## 2013-02-28 MED ORDER — CARBOXYMETHYLCELL-HYPROMELLOSE 0.25-0.3 % OP GEL: 1.0000 [drp] | Freq: Every day | OPHTHALMIC | Status: DC

## 2013-02-28 MED ORDER — AMMONIUM LACTATE 12 % EX LOTN
TOPICAL_LOTION | CUTANEOUS | Status: DC | PRN
  Filled 2013-02-28: qty 400

## 2013-02-28 NOTE — ED Notes (Signed)
Report given to Swink, rn on floor

## 2013-02-28 NOTE — ED Provider Notes (Signed)
CSN: [id number]     Arrival date & time 02/28/13  0809 History   First MD Initiated Contact with Patient 02/28/13 [phone]     Chief Complaint  Patient presents with  . Hemoptysis  . Headache    HPI Pt is concerned because she has been spitting up blood for the last two days.  SHe has had a nosebleed off and on.  She denies vomiting.  She denies any coughing.  NO blood in stool or urine.  No fevers.  SHe has had a headache but no numbness or weakness.  She does have a history of cirrhosis as listed below and is supposed to be seeing a specialist at [facility].  She does not have a history of bleeding like this before. Past Medical History  Diagnosis Date  . Allergy   . Anemia   . Intellectual disability   . Constipation   . Abnormal vaginal bleeding   . Biliary cirrhosis 04/26/11  . 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 DUB (dysfunctional uterine bleeding)   . PONV (postoperative nausea and vomiting)   . Seizures     congenital epilepsy-hx. seizures  . Asthma 09-23-12    Asthma somewhat causing increase wheezing and mild congestion at present  . GERD (gastroesophageal reflux disease)   . Arthritis     right knee   Past Surgical History  Procedure Laterality Date  . Appendectomy    . Cholecystectomy    . Eye muscle surgery    . Eustacean tubes    . Colonoscopy w/ biopsies    . Adenonoidectomy    . Esophagogastroduodenoscopy (egd) with propofol N/A 10/01/2012    Procedure: ESOPHAGOGASTRODUODENOSCOPY (EGD) WITH PROPOFOL;  Surgeon: Willis Modena, MD;  Location: [facility];  Service: Endoscopy;  Laterality: N/A;   Family History  Problem Relation Age of Onset  . Diabetes Mother   . Heart disease Mother   . COPD Mother    History  Substance Use Topics  . Smoking status: Never Smoker   . Smokeless tobacco: Never Used  . Alcohol Use: No   OB History   Grav Para Term Preterm Abortions TAB SAB Ect Mult Living                 Review of Systems  Constitutional: Negative for  fever.  HENT: Positive for nosebleeds.   Gastrointestinal: Negative for vomiting, diarrhea and blood in stool.  Genitourinary: Negative for dysuria.  Musculoskeletal:       Some shoulder pain  All other systems reviewed and are negative.    Allergies  Aspirin; Penicillins; Pseudoephedrine; Sulfa antibiotics; Tramadol hcl; and Latex  Home Medications   Current Outpatient Rx  Name  Route  Sig  Dispense  Refill  . albuterol (PROVENTIL,VENTOLIN) 90 MCG/ACT inhaler   Inhalation   Inhale 2 puffs into the lungs every 4 (four) hours as needed for wheezing.          Marland Kitchen ammonium lactate (LAC-HYDRIN) 12 % cream   Topical   Apply topically as needed for dry skin.   385 g   0   . Azelastine HCl (ASTEPRO) 0.15 % SOLN   Nasal   Place 1 spray into the nose daily. Uses 1 spray in both nostrils         . budesonide-formoterol (SYMBICORT) 160-4.5 MCG/ACT inhaler   Inhalation   Inhale 2 puffs into the lungs 2 (two) times daily.          . calcitRIOL (ROCALTROL) 0.5 MCG capsule  Oral   Take 1 capsule (0.5 mcg total) by mouth daily.   30 capsule   5   . camphor-menthol (SARNA) lotion   Topical   Apply topically as needed for itching.   222 mL   0   . Carboxymethylcell-Hypromellose (GENTEAL) 0.25-0.3 % GEL   Ophthalmic   Apply 1 drop to eye at bedtime.         . carboxymethylcellulose (REFRESH TEARS) 0.5 % SOLN   Both Eyes   Place 1 drop into both eyes 4 (four) times daily as needed (for dry eyes).         . cholecalciferol (VITAMIN D) 1000 UNITS tablet   Oral   Take 2,000 Units by mouth daily.         . COD LIVER OIL PO   Oral   Take 1 capsule by mouth daily.         . diclofenac sodium (VOLTAREN) 1 % GEL   Topical   Apply 2 g topically 4 (four) times daily. Apply to shoulder   100 g   0   . ferrous sulfate 325 (65 FE) MG tablet   Oral   Take 1 tablet (325 mg total) by mouth 2 (two) times daily with a meal.   60 tablet   6   . fluticasone (FLONASE)  50 MCG/ACT nasal spray   Nasal   Place 2 sprays into the nose daily.         . hydrocortisone (ANUSOL-HC) 25 MG suppository   Rectal   Place 1 suppository (25 mg total) rectally 2 (two) times daily.   12 suppository   0   . Lacosamide 100 MG TABS   Oral   Take 2 tablets (200 mg total) by mouth 2 (two) times daily.   120 tablet   12   . levETIRAcetam (KEPPRA) 500 MG tablet   Oral   Take 500-1,000 mg by mouth 2 (two) times daily. Take 1 tablet in the morning (500mg ) and 2 tablets at night (1000mg )         . lurasidone (LATUDA) 40 MG TABS   Oral   Take 40 mg by mouth at bedtime. hs         . montelukast (SINGULAIR) 10 MG tablet   Oral   Take 1 tablet (10 mg total) by mouth daily.   30 tablet   12   . Multiple Vitamin (MULTIVITAMIN) tablet   Oral   Take 1 tablet by mouth daily.         Marland Kitchen omeprazole (PRILOSEC) 40 MG capsule   Oral   Take 40 mg by mouth daily.         . polyethylene glycol (MIRALAX / GLYCOLAX) packet   Oral   Take 17 g by mouth daily as needed (for constipation). In water or juice         . sodium chloride (OCEAN NASAL SPRAY) 0.65 % nasal spray   Nasal   Place 1 spray into the nose as needed for congestion.   30 mL   12   . triamcinolone (KENALOG) 0.025 % ointment   Topical   Apply 1 application topically 2 (two) times daily.         Marland Kitchen trolamine salicylate (ASPERCREME/ALOE) 10 % cream   Topical   Apply topically as needed.   85 g   0   . ursodiol (ACTIGALL) 500 MG tablet   Oral   Take 500 mg by mouth 2 (two)  times daily.          LMP 01/23/2013 Physical Exam  Nursing note and vitals reviewed. Constitutional: She appears well-developed and well-nourished. No distress.  HENT:  Head: Normocephalic and atraumatic.  Right Ear: External ear normal.  Left Ear: External ear normal.  No active nosebleed, questionable blood in oropharynx  Eyes: Conjunctivae are normal. Right eye exhibits no discharge. Left eye exhibits no  discharge. No scleral icterus.  Neck: Neck supple. No tracheal deviation present.  Cardiovascular: Normal rate, regular rhythm and intact distal pulses.   Pulmonary/Chest: Effort normal and breath sounds normal. No stridor. No respiratory distress. She has no wheezes. She has no rales.  Abdominal: Soft. Bowel sounds are normal. She exhibits no distension. There is no tenderness. There is no rebound and no guarding.  Musculoskeletal: She exhibits no edema and no tenderness.  Neurological: She is alert. She has normal strength. No sensory deficit. Cranial nerve deficit:  no gross defecits noted. She exhibits normal muscle tone. She displays no seizure activity. Coordination normal.  Skin: Skin is warm and dry. No rash noted. She is not diaphoretic.  Psychiatric: She has a normal mood and affect.    ED Course  Procedures (including critical care time) Labs Review Labs Reviewed  CBC WITH DIFFERENTIAL - Abnormal; Notable for the following:    RBC 3.57 (*)    Hemoglobin 10.4 (*)    HCT 28.2 (*)    MCHC 36.9 (*)    RDW 24.9 (*)    Platelets 88 (*)    All other components within normal limits  COMPREHENSIVE METABOLIC PANEL - Abnormal; Notable for the following:    Glucose, Bld 112 (*)    Creatinine, Ser 0.49 (*)    Calcium 8.2 (*)    Albumin 2.3 (*)    AST 180 (*)    ALT 95 (*)    Alkaline Phosphatase 188 (*)    Total Bilirubin 9.4 (*)    All other components within normal limits  PROTIME-INR - Abnormal; Notable for the following:    Prothrombin Time >90.0 (*)    INR >10.00 (*)    All other components within normal limits  PROTIME-INR - Abnormal; Notable for the following:    Prothrombin Time >90.0 (*)    INR >10.00 (*)    All other components within normal limits  PREPARE FRESH FROZEN PLASMA   Imaging Review No results found. 1610 Initial INR being reported back at high, will order a repeat but may be accurate with her history of primary biliary cirrhosis 1036 repeat INR  confirms the level MDM   1. Elevated INR   2. PBC (primary biliary cirrhosis)    The patient's anemia is relatively stable although slightly lower than last month. The patient's INR is significantly elevated compared to prior results on file. Patient does have a significant elevation in her bilirubin. 6 months ago it was 2.8. 2 days ago it was 9.2. The patient did not have an INR drawn 2 days ago.  Patient fortunately does not appear to have any serious bleeding at this time. However, with her significantly elevated INR she is at high risk for this. She will require hospitalization for monitoring and possibly correction. I do not feel that she requires plasma at this time. I have ordered vitamin K. Patient will be admitted to the family practice service    Celene Kras, MD 02/28/13 1037

## 2013-02-28 NOTE — Consult Note (Signed)
Reason for Consult:Possible change in anticonvulsant medication.  HPI:                                                                                                                                          [name] is an 37 y.o. female with a history of seizure disorder since birth, primary biliary cirrhosis, asthma, arthritis and learning disability, admitted for coagulopathy with markedly elevated INR and bleeding from her gums. Patient has been taking Keppra 500 mg in the morning and 1000 mg at bedtime, as well as Vimpat 200 mg twice a day. The last seizure reportedly was greater than one year ago. This increase in Vimpat was in March 2014. Neurology consultation was obtained for possibly discontinuing Vimpat, as this may be a contributor to the patient's liver failure. She previously took Tegretol and Depakote which were discontinued and she was placed on Keppra and Vimpat instead. Patient is not aware of any specific side effects experienced with Vimpat. She's been followed by Dr.Yan of [facility].  Past Medical History  Diagnosis Date  . Allergy   . Anemia   . Intellectual disability   . Constipation   . Abnormal vaginal bleeding   . Biliary cirrhosis 04/26/11  . 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 DUB (dysfunctional uterine bleeding)   . PONV (postoperative nausea and vomiting)   . Seizures     congenital epilepsy-hx. seizures  . Asthma 09-23-12    Asthma somewhat causing increase wheezing and mild congestion at present  . GERD (gastroesophageal reflux disease)   . Arthritis     right knee  . Vitamin D deficiency     Past Surgical History  Procedure Laterality Date  . Appendectomy    . Cholecystectomy    . Eye muscle surgery    . Eustacean tubes    . Colonoscopy w/ biopsies    . Adenonoidectomy    . Esophagogastroduodenoscopy (egd) with propofol N/A 10/01/2012    Procedure: ESOPHAGOGASTRODUODENOSCOPY (EGD) WITH PROPOFOL;  Surgeon: Willis Modena,  MD;  Location: [facility];  Service: Endoscopy;  Laterality: N/A;    Family History  Problem Relation Age of Onset  . Diabetes Mother   . Heart disease Mother   . COPD Mother     Social History:  reports that she has never smoked. She has never used smokeless tobacco. She reports that she does not drink alcohol or use illicit drugs.  Allergies  Allergen Reactions  . Aspirin Nausea And Vomiting    Due to Stomach ulcer  . Penicillins Itching and Swelling    Breakout   . Pseudoephedrine Itching and Swelling    Tongue swelling  . Sulfa Antibiotics Nausea And Vomiting  . Tramadol Hcl Itching and Nausea And Vomiting    REACTION: seizures  . Latex Itching and Rash    breakout    MEDICATIONS:  I have reviewed the patient's current medications.   ROS:                                                                                                                                       History obtained from chart review and the patient  General ROS: negative for - chills, fatigue, fever, night sweats, weight gain or weight loss Psychological ROS: negative for - behavioral disorder, hallucinations, memory difficulties, mood swings or suicidal ideation Ophthalmic ROS: negative for - blurry vision, double vision, eye pain or loss of vision ENT ROS: negative for - epistaxis, nasal discharge, oral lesions, sore throat, tinnitus or vertigo Allergy and Immunology ROS: negative for - hives or itchy/watery eyes Hematological and Lymphatic see history of present illness Endocrine ROS: negative for - galactorrhea, hair pattern changes, polydipsia/polyuria or temperature intolerance Respiratory ROS: negative for - cough, hemoptysis, shortness of breath or wheezing Cardiovascular ROS: negative for - chest pain, dyspnea on exertion, edema or irregular heartbeat Gastrointestinal ROS:  negative for - abdominal pain, diarrhea, hematemesis, nausea/vomiting or stool incontinence Genito-Urinary ROS: negative for - dysuria, hematuria, incontinence or urinary frequency/urgency Musculoskeletal ROS: negative for - joint swelling or muscular weakness Neurological ROS: as noted in HPI Dermatological ROS: negative for rash and skin lesion changes   Blood pressure 106/66, pulse 101, temperature 98.8 F (37.1 C), temperature source Oral, resp. rate 18, height 5\' 4"  (1.626 m), weight 67.6 kg (149 lb 0.5 oz), last menstrual period 01/23/2013, SpO2 99.00%.   Neurologic Examination:                                                                                                      Mental Status: Alert, oriented x3, thought content appropriate.  Speech moderately slurred without evidence of aphasia. Able to follow commands without difficulty. Cranial Nerves: II-Visual fields were normal. III/IV/VI-Pupils were equal and reacted. Extraocular movements were full and conjugate.    V/VII-no facial numbness; moderate right lower facial weakness. VIII-normal. X-moderate dysarthria; symmetrical palatal movement. XII-midline tongue extension Motor: 5/5 bilaterally with normal tone and bulk Sensory: Normal throughout. Deep Tendon Reflexes: 1+ and symmetric. Plantars: Equivocal on the right; flexor on the left. Cerebellar: Normal finger-to-nose testing.  Lab Results  Component Value Date/Time   CHOL 267* 02/04/2008  8:18 PM    Results for orders placed during the hospital encounter of 02/28/13 (from the past 48 hour(s))  CBC WITH DIFFERENTIAL     Status: Abnormal   Collection Time  02/28/13  8:35 AM      Result Value Range   WBC 5.1  4.0 - 10.5 K/uL   RBC 3.57 (*) 3.87 - 5.11 MIL/uL   Hemoglobin 10.4 (*) 12.0 - 15.0 g/dL   HCT 16.1 (*) 09.6 - 04.5 %   MCV 79.0  78.0 - 100.0 fL   MCH 29.1  26.0 - 34.0 pg   MCHC 36.9 (*) 30.0 - 36.0 g/dL   RDW 40.9 (*) 81.1 - 91.4 %   Platelets 88  (*) 150 - 400 K/uL   Comment: SPECIMEN CHECKED FOR CLOTS     REPEATED TO VERIFY     PLATELET COUNT CONFIRMED BY SMEAR   Neutrophils Relative % 59  43 - 77 %   Lymphocytes Relative 27  12 - 46 %   Monocytes Relative 10  3 - 12 %   Eosinophils Relative 3  0 - 5 %   Basophils Relative 1  0 - 1 %   Neutro Abs 2.9  1.7 - 7.7 K/uL   Lymphs Abs 1.4  0.7 - 4.0 K/uL   Monocytes Absolute 0.5  0.1 - 1.0 K/uL   Eosinophils Absolute 0.2  0.0 - 0.7 K/uL   Basophils Absolute 0.1  0.0 - 0.1 K/uL   RBC Morphology TARGET CELLS    COMPREHENSIVE METABOLIC PANEL     Status: Abnormal   Collection Time    02/28/13  8:35 AM      Result Value Range   Sodium 136  135 - 145 mEq/L   Potassium 4.1  3.5 - 5.1 mEq/L   Comment: SLIGHT HEMOLYSIS   Chloride 103  96 - 112 mEq/L   CO2 26  19 - 32 mEq/L   Glucose, Bld 112 (*) 70 - 99 mg/dL   BUN 8  6 - 23 mg/dL   Creatinine, Ser 7.82 (*) 0.50 - 1.10 mg/dL   Calcium 8.2 (*) 8.4 - 10.5 mg/dL   Total Protein 7.7  6.0 - 8.3 g/dL   Albumin 2.3 (*) 3.5 - 5.2 g/dL   AST 956 (*) 0 - 37 U/L   ALT 95 (*) 0 - 35 U/L   Alkaline Phosphatase 188 (*) 39 - 117 U/L   Total Bilirubin 9.4 (*) 0.3 - 1.2 mg/dL   GFR calc non Af Amer >90  >90 mL/min   GFR calc Af Amer >90  >90 mL/min   Comment: (NOTE)     The eGFR has been calculated using the CKD EPI equation.     This calculation has not been validated in all clinical situations.     eGFR's persistently <90 mL/min signify possible Chronic Kidney     Disease.  PROTIME-INR     Status: Abnormal   Collection Time    02/28/13  8:35 AM      Result Value Range   Prothrombin Time >90.0 (*) 11.6 - 15.2 seconds   Comment: SPECIMEN CHECKED FOR CLOTS     REPEATED TO VERIFY     RESULT CHECKED   INR >10.00 (*) 0.00 - 1.49   Comment: SPECIMEN CHECKED FOR CLOTS     REPEATED TO VERIFY     RESULT CHECKED     CRITICAL RESULT CALLED TO, READ BACK BY AND VERIFIED WITH:     FAITH MARIE RN AT [phone] ON 04OCT14 BY C.BONGEL  PROTIME-INR      Status: Abnormal   Collection Time    02/28/13  9:30 AM      Result Value  Range   Prothrombin Time >90.0 (*) 11.6 - 15.2 seconds   INR >10.00 (*) 0.00 - 1.49   Comment: CRITICAL RESULT CALLED TO, READ BACK BY AND VERIFIED WITH:     J.HAMILTON AT 1008 ON 04OCT14 BY C.BONGEL  PREPARE FRESH FROZEN PLASMA     Status: None   Collection Time    02/28/13  2:17 PM      Result Value Range   Unit Number [id number]     Blood Component Type THAWED PLASMA     Unit division 00     Status of Unit ISSUED     Transfusion Status OK TO TRANSFUSE     Unit Number [id number]     Blood Component Type THAWED PLASMA     Unit division 00     Status of Unit ALLOCATED     Transfusion Status OK TO TRANSFUSE    CBC     Status: Abnormal   Collection Time    02/28/13  2:25 PM      Result Value Range   WBC 4.7  4.0 - 10.5 K/uL   Comment: WHITE COUNT CONFIRMED ON SMEAR   RBC 3.14 (*) 3.87 - 5.11 MIL/uL   Hemoglobin 9.3 (*) 12.0 - 15.0 g/dL   HCT 30.8 (*) 65.7 - 84.6 %   MCV 77.7 (*) 78.0 - 100.0 fL   MCH 29.6  26.0 - 34.0 pg   MCHC 38.1 (*) 30.0 - 36.0 g/dL   Comment: RULED OUT INTERFERING SUBSTANCES   RDW 23.9 (*) 11.5 - 15.5 %   Platelets 72 (*) 150 - 400 K/uL   Comment: PLATELET COUNT CONFIRMED BY SMEAR  AMMONIA     Status: Abnormal   Collection Time    02/28/13  2:25 PM      Result Value Range   Ammonia 86 (*) 11 - 60 umol/L  APTT     Status: Abnormal   Collection Time    02/28/13  2:25 PM      Result Value Range   aPTT 123 (*) 24 - 37 seconds   Comment:            IF BASELINE aPTT IS ELEVATED,     SUGGEST PATIENT RISK ASSESSMENT     BE USED TO DETERMINE APPROPRIATE     ANTICOAGULANT THERAPY.  PROTIME-INR     Status: Abnormal   Collection Time    02/28/13  2:25 PM      Result Value Range   Prothrombin Time >90.0 (*) 11.6 - 15.2 seconds   INR >10.00 (*) 0.00 - 1.49   Comment: SPECIMEN CHECKED FOR CLOTS     CRITICAL RESULT CALLED TO, READ BACK BY AND VERIFIED WITH:     ZHU J.,RN  02/28/13 1525 BY JONESJ  TYPE AND SCREEN     Status: None   Collection Time    02/28/13  4:28 PM      Result Value Range   ABO/RH(D) O POS     Antibody Screen NEG     Sample Expiration 03/03/2013    ABO/RH     Status: None   Collection Time    02/28/13  4:28 PM      Result Value Range   ABO/RH(D) O POS      No results found.  Assessment/Plan: 37 year old lady with known seizure disorder since birth, currently controlled on current doses of Keppra and Vimpat. Patient has primary biliary cirrhosis with elevations in AST and ALT, as well as alkaline phosphatase.  There is no clear indication of hepatocellular toxicity that could be attributed to Vimpat. Given that her seizures are controlled on the current medication regimen, and lack of other anticonvulsant medications without some degree of hepatic toxicity, I am reluctant to discontinue Vimpat.  Recommend no changes in current anticonvulsant medications. No further neurological intervention is indicated. Routine followup with [facility] following discharge.  C.R. Roseanne Reno, MD [name] [phone]  02/28/2013, 9:30 PM

## 2013-02-28 NOTE — Progress Notes (Signed)
NURSING PROGRESS NOTE  [name] [id number] Admission Data: 02/28/2013 4:50 PM Attending Provider: Janit Pagan, MD [id number], Judeth Cornfield, MD Code Status: Full  [name] is a 37 y.o. female patient admitted from ED:  -No acute distress noted.  -No complaints of shortness of breath.  -No complaints of chest pain.   Cardiac Monitoring: Box # 09 in place. Cardiac monitor yields:normal sinus rhythm, occasional premature ventricular beats.  Blood pressure 135/70, pulse 93, temperature 98.6 F (37 C), temperature source Oral, resp. rate 18, height 5\' 4"  (1.626 m), weight 67.6 kg (149 lb 0.5 oz), last menstrual period 01/23/2013, SpO2 100.00%.   IV Fluids:  IV in place, occlusive dsg intact without redness, IV cath antecubital left, condition patent and no redness none.   Allergies:  Aspirin; Penicillins; Pseudoephedrine; Sulfa antibiotics; Tramadol hcl; and Latex  Past Medical History:   has a past medical history of Allergy; Anemia; Intellectual disability; Constipation; Abnormal vaginal bleeding; Biliary cirrhosis (04/26/11); Primary biliary cirrhosis (05/07/2011); DUB (dysfunctional uterine bleeding); PONV (postoperative nausea and vomiting); Seizures; Asthma (09-23-12); GERD (gastroesophageal reflux disease); Arthritis; and Vitamin D deficiency.  Past Surgical History:   has past surgical history that includes Appendectomy; Cholecystectomy; Eye muscle surgery; eustacean tubes; Colonoscopy w/ biopsies; adenonoidectomy; and Esophagogastroduodenoscopy (egd) with propofol (N/A, 10/01/2012).  Social History:   reports that she has never smoked. She has never used smokeless tobacco. She reports that she does not drink alcohol or use illicit drugs.  Skin: intact  Patient/Family orientated to room. Information packet given to patient/family. Admission inpatient armband information verified with patient/family to include name and date of birth and placed on patient arm. Side rails up x 2,  fall assessment and education completed with patient/family. Patient/family able to verbalize understanding of risk associated with falls and verbalized understanding to call for assistance before getting out of bed. Call light within reach. Patient/family able to voice and demonstrate understanding of unit orientation instructions.    Will continue to evaluate and treat per MD orders.

## 2013-02-28 NOTE — ED Notes (Addendum)
Pt appears to have some sort of MR.  Pt states that she has been spitting up streaks of blood this morning at 3 am.  States that she also has had a headache since yesterday.  Denies NVD.  Pt denies having a cough.  Pt's caretaker wanted to mention that she has been drinking a lot of tropical fruit punch koolaid.

## 2013-02-28 NOTE — H&P (Signed)
Family Medicine Teaching [facility] Admission History and Physical Service Pager: [phone]  Patient name: [name] Medical record number: [id number] Date of birth: 1976/01/07 Age: 37 y.o. Gender: female  Primary Care Provider: Marena Chancy, MD Consultants: GI Code Status: full, pending discussion with family  Chief Complaint: Bleeding gums  Assessment and Plan: [name] is a 37 y.o. female presenting with bleeding gums and recent nose bleeds and found to have elevated INR to >10. PMH is significant for primary biliary cirrhosis, epilepsy, MR, schizophrenia, asthma and GI bleeds.  # ESLD with Elevated INR: >10 in ED. Primary Biliary Cirrhosis followed by Dr. Dulce Sellar and recently referred to [name] (OP appoinment next week for primary evaluation by Hepatology). S/p vitamin K in ED.  Marland Kitchen Declining synthetic function as well as rising bilirubin level. MELD SCORE OF 32 without renal involvement (INR & Bilirubin).  No evidence of profound encephalopathy. - monitor INR and bleeding - FFP X 2 Units now, more if if active bleeding - GI consult (need for SBP prophylaxis? transfer to [name]?) - Continue home ursodiol (indicated for PBC) - neuro checks q4 - f/u inr, cbc, ptt, ammonia - recheck inr, cbc, cmet in am  # Asthma: persistent, no acute exacerbation - continue home symbicort, singulair and albuterol prn  # Epilepsy: stable, no signs of seizure activity - continue home keppra and vimpat  # Schizophrenia, Mental Retardation: mental status at baseline per family member (uncle), neuro checks as above - continue home latuda  # Heart Murmur: Prior SEM without evidence on prior ECHOs for etiology (normal EF with hypermobile intra-atrial septum).  Will defer further evaluation as likely flow related murmur given prior findings. - Continue to monitor  FEN/GI: Regular diet, saline lock IV, home PPI and miralax Prophylaxis: Elevated INR  Disposition: Admit to telemetry for close  monitoring   History of Present Illness: [name] is a 37 y.o. female presenting with bleeding gums found to have elevated INR in ED. The patient report that this morning when she woke up she noticed a blood clot that fell out of her mouth. After rinsing out her mouth she noticed that her gums were continuing to bleed near her bottom lip. She also endorses some recent nose bleeds. She denies new/worsening abdominal pain, nausea/vomiting, bowel changes. Neither she nor her caregiver have noticed any changes to her mental state.  Review Of Systems: Per HPI with the following additions:  Pt denies chest pain, palpitations, shortness of breath/DOE, orthopnea/PND, LE swelling. Pt reports occasional chills but no overt fevers, rigors.  + Dark urine but no frequency, hesistancy, dysuria.  + dark stools X2 weeks without hematachezia.  No N/V or hematemesis. : +shoulder pain (chronic), +belly pain (chronic) Otherwise 12 point review of systems was performed and was unremarkable.   Patient Active Problem List   Diagnosis Date Noted  . Left shoulder pain 02/03/2013  . Toe pain 11/21/2012  . Fatigue 08/20/2012  . Foot pain 08/20/2012  . Schizophrenia, paranoid type 06/25/2012    Class: Chronic  . 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 Vision loss, bilateral 03/30/2012  . 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 Menorrhagia 12/22/2010  . Unspecified vitamin D deficiency 09/13/2010  . HEMATURIA UNSPECIFIED 06/14/2010  . RECTAL BLEEDING, HX OF 06/14/2010  . ANEMIA 03/03/2010  . GERD 07/21/2008  . 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 CARDIAC FLOW MURMUR 10/16/2007  . EPILEPSY 08/23/2006  . ALLERGIC RHINITIS 07/30/2006  . OBESITY, NOS 07/25/2006  . 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  MENTAL RETARDATION 07/25/2006  . ASTHMA, PERSISTENT 07/25/2006  . OSTEOARTHRITIS, LOWER LEG 07/25/2006   Past Medical History: Past Medical History  Diagnosis Date  . Allergy   . Anemia   . Intellectual disability    . Constipation   . Abnormal vaginal bleeding   . Biliary cirrhosis 04/26/11  . 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 DUB (dysfunctional uterine bleeding)   . PONV (postoperative nausea and vomiting)   . Seizures     congenital epilepsy-hx. seizures  . Asthma 09-23-12    Asthma somewhat causing increase wheezing and mild congestion at present  . GERD (gastroesophageal reflux disease)   . Arthritis     right knee  . Vitamin D deficiency    Past Surgical History: Past Surgical History  Procedure Laterality Date  . Appendectomy    . Cholecystectomy    . Eye muscle surgery    . Eustacean tubes    . Colonoscopy w/ biopsies    . Adenonoidectomy    . Esophagogastroduodenoscopy (egd) with propofol N/A 10/01/2012    Procedure: ESOPHAGOGASTRODUODENOSCOPY (EGD) WITH PROPOFOL;  Surgeon: Willis Modena, MD;  Location: [facility];  Service: Endoscopy;  Laterality: N/A;   Social History: History  Substance Use Topics  . Smoking status: Never Smoker   . Smokeless tobacco: Never Used  . Alcohol Use: No   Additional social history: Lives alone but uncle and his wife live next door and are her primary caregivers. Dorene Sorrow (uncle) can be reached at [phone].  Please also refer to relevant sections of EMR.  Family History: Family History  Problem Relation Age of Onset  . Diabetes Mother   . Heart disease Mother   . COPD Mother    Allergies and Medications: Allergies  Allergen Reactions  . Aspirin Nausea And Vomiting    Due to Stomach ulcer  . Penicillins Itching and Swelling    Breakout   . Pseudoephedrine Itching and Swelling    Tongue swelling  . Sulfa Antibiotics Nausea And Vomiting  . Tramadol Hcl Itching and Nausea And Vomiting    REACTION: seizures  . Latex Itching and Rash    breakout   No current facility-administered medications on file prior to encounter.   Current Outpatient Prescriptions on File Prior to Encounter  Medication Sig Dispense Refill  . albuterol  (PROVENTIL,VENTOLIN) 90 MCG/ACT inhaler Inhale 2 puffs into the lungs every 4 (four) hours as needed for wheezing.       Marland Kitchen ammonium lactate (LAC-HYDRIN) 12 % cream Apply topically as needed for dry skin.  385 g  0  . Azelastine HCl (ASTEPRO) 0.15 % SOLN Place 1 spray into the nose daily. Uses 1 spray in both nostrils      . budesonide-formoterol (SYMBICORT) 160-4.5 MCG/ACT inhaler Inhale 2 puffs into the lungs 2 (two) times daily.       . calcitRIOL (ROCALTROL) 0.5 MCG capsule Take 1 capsule (0.5 mcg total) by mouth daily.  30 capsule  5  . camphor-menthol (SARNA) lotion Apply topically as needed for itching.  222 mL  0  . Carboxymethylcell-Hypromellose (GENTEAL) 0.25-0.3 % GEL Apply 1 drop to eye at bedtime.      . carboxymethylcellulose (REFRESH TEARS) 0.5 % SOLN Place 1 drop into both eyes 4 (four) times daily as needed (for dry eyes).      . cholecalciferol (VITAMIN D) 1000 UNITS tablet Take 2,000 Units by mouth daily.      . COD LIVER OIL PO Take 1 capsule by mouth daily.      Marland Kitchen  diclofenac sodium (VOLTAREN) 1 % GEL Apply 2 g topically 4 (four) times daily. Apply to shoulder  100 g  0  . ferrous sulfate 325 (65 FE) MG tablet Take 1 tablet (325 mg total) by mouth 2 (two) times daily with a meal.  60 tablet  6  . fluticasone (FLONASE) 50 MCG/ACT nasal spray Place 2 sprays into the nose daily.      . hydrocortisone (ANUSOL-HC) 25 MG suppository Place 1 suppository (25 mg total) rectally 2 (two) times daily.  12 suppository  0  . Lacosamide 100 MG TABS Take 2 tablets (200 mg total) by mouth 2 (two) times daily.  120 tablet  12  . lurasidone (LATUDA) 40 MG TABS Take 40 mg by mouth at bedtime. hs      . montelukast (SINGULAIR) 10 MG tablet Take 1 tablet (10 mg total) by mouth daily.  30 tablet  12  . Multiple Vitamin (MULTIVITAMIN) tablet Take 1 tablet by mouth daily.      Marland Kitchen omeprazole (PRILOSEC) 40 MG capsule Take 40 mg by mouth daily.      . polyethylene glycol (MIRALAX / GLYCOLAX) packet Take 17  g by mouth daily as needed (for constipation). In water or juice      . sodium chloride (OCEAN NASAL SPRAY) 0.65 % nasal spray Place 1 spray into the nose as needed for congestion.  30 mL  12  . triamcinolone (KENALOG) 0.025 % ointment Apply 1 application topically 2 (two) times daily.      Marland Kitchen trolamine salicylate (ASPERCREME/ALOE) 10 % cream Apply topically as needed.  85 g  0  . ursodiol (ACTIGALL) 500 MG tablet Take 500 mg by mouth 2 (two) times daily.        Objective: BP 111/74  Pulse 94  Temp(Src) 98.8 F (37.1 C) (Oral)  Resp 16  Ht 5\' 4"  (1.626 m)  Wt 149 lb 0.5 oz (67.6 kg)  BMI 25.57 kg/m2  SpO2 99%  LMP 01/23/2013 Exam: General: Lying in bed, comfortable, NAD HEENT: MMM, no LAD, EOMI, orpharynx clear, small amount of blood oozing from around poor dentition and recent dental work on left superior molars Cardiovascular: RRR, 3/6 SEM heard best at LSB Respiratory: CTAB, normal WOB Abdomen: soft, mildly distended, +fluid wave, liver edge cm below costal margin, nontender Extremities: No LE edema Skin: warm, dry, stable dark area on left ankle, not new bruise per patient Neuro: alert, oriented, no focal deficits  Labs and Imaging: CBC BMET   Recent Labs Lab 02/28/13 0835  WBC 5.1  HGB 10.4*  HCT 28.2*  PLT 88*    Recent Labs Lab 02/28/13 0835  NA 136  K 4.1  CL 103  CO2 26  BUN 8  CREATININE 0.49*  GLUCOSE 112*  CALCIUM 8.2*      02/28/2013 08:35  Prothrombin Time >90.0 (H)  INR >10.00 (HH)    02/28/2013 08:35  Albumin 2.3 (L)  AST 180 (H)  ALT 95 (H)  Total Protein 7.7  Total Bilirubin 9.4 (H)   Beverely Low, MD 02/28/2013, 1405 PGY-1,  Family Medicine FPTS Intern pager: [phone], text pages welcome  R2 Addendum:  I have seen the above patient and have discussed the patient's presentation, history, objective data, physical exam and assessment and plan with Dr. Richarda Blade.  Briefly, this patient is a 37 y.o. year old female with Primary  Biliary Cirrhosis presenting with INR >10 and MELD score of 32.  Pt Hgb stable s/p Vit K 10mg   and going to receive 2units FFP.  GI to see.  No current indication for SBP prophylaxis however if worsening clinical course or active GI bleed would need to reconsider.  Pt needs to be evaluated at [facility] for transplant consideration, hopeful she will improve and this can be completed as OP but may need to consider tertiary transfer if prolonged hospitalization.  Also given multiple medical comorbidities I will touch base with the pharmacy to ensure no meds need adjusting in setting of liver disease or could be contributing.  Andrena Mews, DO [facility] Resident - PGY-3 02/28/2013 2:54 PM

## 2013-02-28 NOTE — ED Notes (Signed)
[facility] called and on their way

## 2013-02-28 NOTE — ED Notes (Signed)
Reign Dziuba, pts uncle  [phone]

## 2013-02-28 NOTE — Progress Notes (Signed)
INITIAL NUTRITION ASSESSMENT  DOCUMENTATION CODES Per approved criteria  -Not Applicable   INTERVENTION: 1. No nutrition interventions at this time.   NUTRITION DIAGNOSIS: Unintentional weight loss related to unknown etiology as evidenced by pt report.   Goal: PO intake to meet >/=90% estimated nutrition needs.   Monitor:  PO intake, weight trends  Reason for Assessment:  Malnutrition Screening Tool  37 y.o. female  Admitting Dx: <principal problem not specified>  ASSESSMENT: Pt with hx of primary biliary cirrhosis, epilepsy, MR, schizophrenia, asthma and GI bleeds. Brought to ED with bleeding gums and admitted for elevated INR.   RD pulled to pt for positive malnutrition screening tool. Pt reports that she has lost 11 lbs in the past few months. Appetite has remained normal. Dienes any other changes associated with the weight loss. Weight hx shows that in the past 7 months weight has been variable between 142-156 lbs, currently 149 lbs.   Height: Ht Readings from Last 1 Encounters:  02/28/13 5\' 4"  (1.626 m)    Weight: Wt Readings from Last 1 Encounters:  02/28/13 149 lb 0.5 oz (67.6 kg)    Ideal Body Weight: 120 lbs   % Ideal Body Weight: 124%  Wt Readings from Last 10 Encounters:  02/28/13 149 lb 0.5 oz (67.6 kg)  02/09/13 147 lb (66.679 kg)  02/05/13 148 lb 14.4 oz (67.541 kg)  02/02/13 148 lb 11.2 oz (67.45 kg)  01/14/13 147 lb (66.679 kg)  11/21/12 143 lb (64.864 kg)  10/01/12 156 lb (70.761 kg)  10/01/12 156 lb (70.761 kg)  09/04/12 142 lb 14.4 oz (64.819 kg)  08/20/12 144 lb (65.318 kg)    Usual Body Weight: variable, 142-156 lbs   % Usual Body Weight: 10%  BMI:  Body mass index is 25.57 kg/(m^2). overweight   Estimated Nutritional Needs: Kcal: 1700-1900 Protein: 55-65 gm  Fluid: 1.7-1.9 L   Skin: intact   Diet Order: General  EDUCATION NEEDS: -No education needs identified at this time  No intake or output data in the 24 hours ending  02/28/13 1421  Last BM: PTA    Labs:   Recent Labs Lab 02/26/13 1030 02/28/13 0835  NA 134* 136  K 3.9 4.1  CL 103 103  CO2 27 26  BUN 9 8  CREATININE 0.4 0.49*  CALCIUM 7.8*  7.9* 8.2*  GLUCOSE 100* 112*    CBG (last 3)  No results found for this basename: GLUCAP,  in the last 72 hours Lab Results  Component Value Date   HGBA1C 5.3 09/19/2010    Scheduled Meds: . budesonide-formoterol  2 puff Inhalation BID  . [START ON 03/01/2013] calcitRIOL  0.5 mcg Oral Daily  . [START ON 03/01/2013] cholecalciferol  2,000 Units Oral Daily  . ferrous sulfate  325 mg Oral BID WC  . [START ON 03/01/2013] fluticasone  2 spray Each Nare Daily  . hydrocortisone  25 mg Rectal BID  . lacosamide  200 mg Oral BID  . levETIRAcetam  1,000 mg Oral Q2200  . [START ON 03/01/2013] levETIRAcetam  500 mg Oral q morning - 10a  . lurasidone  40 mg Oral QHS  . [START ON 03/01/2013] montelukast  10 mg Oral Daily  . [START ON 03/01/2013] multivitamin with minerals  1 tablet Oral Daily  . [START ON 03/01/2013] pantoprazole  40 mg Oral Daily  . [START ON 03/01/2013] pneumococcal 23 valent vaccine  0.5 mL Intramuscular Tomorrow-1000  . sodium chloride  3 mL Intravenous Q12H  . ursodiol  500 mg Oral BID    Continuous Infusions:   Past Medical History  Diagnosis Date  . Allergy   . Anemia   . Intellectual disability   . Constipation   . Abnormal vaginal bleeding   . Biliary cirrhosis 04/26/11  . 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 DUB (dysfunctional uterine bleeding)   . PONV (postoperative nausea and vomiting)   . Seizures     congenital epilepsy-hx. seizures  . Asthma 09-23-12    Asthma somewhat causing increase wheezing and mild congestion at present  . GERD (gastroesophageal reflux disease)   . Arthritis     right knee  . Vitamin D deficiency     Past Surgical History  Procedure Laterality Date  . Appendectomy    . Cholecystectomy    . Eye muscle surgery    . Eustacean tubes    .  Colonoscopy w/ biopsies    . Adenonoidectomy    . Esophagogastroduodenoscopy (egd) with propofol N/A 10/01/2012    Procedure: ESOPHAGOGASTRODUODENOSCOPY (EGD) WITH PROPOFOL;  Surgeon: Willis Modena, MD;  Location: [facility];  Service: Endoscopy;  Laterality: N/A;    Isabell Jarvis RD, LDN Pager [phone] After Hours pager [phone]

## 2013-02-28 NOTE — Consult Note (Signed)
Referring Provider: Dr. Abigail Miyamoto Primary Care Physician:  Marena Chancy, MD Primary Gastroenterologist:  Dr. Dulce Sellar  Reason for Consultation:  Primary Biliary Cirrhosis  HPI: [name] is a 37 y.o. female with a history of primary biliary cirrhosis who has been referred to [facility] for consultation to determine if she is a liver transplant candidate by Dr. Dulce Sellar. EGD in May 2014 showed no varices and no portal hypertensive gastropathy. She is admitted for bleeding gums and epistaxis and found to have an INR > 10. LFTs elevated with TB 9.4, ALP 188, AST 180, ALT 95. Pancytopenic. Reports intermittent abdominal pain chronically that will last all day when it happens. Denies N/V. Patient has possible mental retardation so her history is difficult to obtain. In August her TB 5, DB 3.2, ALP 136, AST 133, ALT 80, Albumin 3.0.     Past Medical History  Diagnosis Date  . Allergy   . Anemia   . Intellectual disability   . Constipation   . Abnormal vaginal bleeding   . Biliary cirrhosis 04/26/11  . 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 DUB (dysfunctional uterine bleeding)   . PONV (postoperative nausea and vomiting)   . Seizures     congenital epilepsy-hx. seizures  . Asthma 09-23-12    Asthma somewhat causing increase wheezing and mild congestion at present  . GERD (gastroesophageal reflux disease)   . Arthritis     right knee  . Vitamin D deficiency     Past Surgical History  Procedure Laterality Date  . Appendectomy    . Cholecystectomy    . Eye muscle surgery    . Eustacean tubes    . Colonoscopy w/ biopsies    . Adenonoidectomy    . Esophagogastroduodenoscopy (egd) with propofol N/A 10/01/2012    Procedure: ESOPHAGOGASTRODUODENOSCOPY (EGD) WITH PROPOFOL;  Surgeon: Willis Modena, MD;  Location: [facility];  Service: Endoscopy;  Laterality: N/A;    Prior to Admission medications   Medication Sig Start Date End Date Taking? Authorizing Provider  albuterol (PROVENTIL,VENTOLIN)  90 MCG/ACT inhaler Inhale 2 puffs into the lungs every 4 (four) hours as needed for wheezing.    Yes Historical Provider, MD  ammonium lactate (LAC-HYDRIN) 12 % cream Apply topically as needed for dry skin. 06/18/12  Yes Lonia Skinner, MD  Azelastine HCl (ASTEPRO) 0.15 % SOLN Place 1 spray into the nose daily. Uses 1 spray in both nostrils   Yes Historical Provider, MD  budesonide-formoterol (SYMBICORT) 160-4.5 MCG/ACT inhaler Inhale 2 puffs into the lungs 2 (two) times daily.    Yes Historical Provider, MD  calcitRIOL (ROCALTROL) 0.5 MCG capsule Take 1 capsule (0.5 mcg total) by mouth daily. 02/11/13  Yes Reather Littler, MD  camphor-menthol [facility]) lotion Apply topically as needed for itching. 06/18/12  Yes Lonia Skinner, MD  Carboxymethylcell-Hypromellose (GENTEAL) 0.25-0.3 % GEL Apply 1 drop to eye at bedtime.   Yes Historical Provider, MD  carboxymethylcellulose (REFRESH TEARS) 0.5 % SOLN Place 1 drop into both eyes 4 (four) times daily as needed (for dry eyes).   Yes Historical Provider, MD  cholecalciferol (VITAMIN D) 1000 UNITS tablet Take 2,000 Units by mouth daily.   Yes Historical Provider, MD  COD LIVER OIL PO Take 1 capsule by mouth daily.   Yes Historical Provider, MD  diclofenac sodium (VOLTAREN) 1 % GEL Apply 2 g topically 4 (four) times daily. Apply to shoulder 02/02/13  Yes Lonia Skinner, MD  ferrous sulfate 325 (65 FE) MG tablet Take 1 tablet (  325 mg total) by mouth 2 (two) times daily with a meal. 12/29/12  Yes Lonia Skinner, MD  fluticasone (FLONASE) 50 MCG/ACT nasal spray Place 2 sprays into the nose daily.   Yes Historical Provider, MD  hydrocortisone (ANUSOL-HC) 25 MG suppository Place 1 suppository (25 mg total) rectally 2 (two) times daily. 01/14/13  Yes Leona Singleton, MD  Lacosamide 100 MG TABS Take 2 tablets (200 mg total) by mouth 2 (two) times daily. 08/25/12  Yes Levert Feinstein, MD  levETIRAcetam (KEPPRA) 500 MG tablet Take 500-1,000 mg by mouth 2 (two) times daily. Take  1 tablet in the morning (500mg ) and 2 tablets at night (1000mg )   Yes Historical Provider, MD  lurasidone (LATUDA) 40 MG TABS Take 40 mg by mouth at bedtime. hs   Yes Historical Provider, MD  montelukast (SINGULAIR) 10 MG tablet Take 1 tablet (10 mg total) by mouth daily. 11/01/11  Yes Rodolph Bong, MD  Multiple Vitamin (MULTIVITAMIN) tablet Take 1 tablet by mouth daily.   Yes Historical Provider, MD  omeprazole (PRILOSEC) 40 MG capsule Take 40 mg by mouth daily.   Yes Historical Provider, MD  polyethylene glycol (MIRALAX / GLYCOLAX) packet Take 17 g by mouth daily as needed (for constipation). In water or juice   Yes Historical Provider, MD  sodium chloride (OCEAN NASAL SPRAY) 0.65 % nasal spray Place 1 spray into the nose as needed for congestion. 09/04/12  Yes Lonia Skinner, MD  triamcinolone (KENALOG) 0.025 % ointment Apply 1 application topically 2 (two) times daily.   Yes Historical Provider, MD  trolamine salicylate (ASPERCREME/ALOE) 10 % cream Apply topically as needed. 11/21/12  Yes Lonia Skinner, MD  ursodiol (ACTIGALL) 500 MG tablet Take 500 mg by mouth 2 (two) times daily.   Yes Historical Provider, MD    Scheduled Meds: . budesonide-formoterol  2 puff Inhalation BID  . [START ON 03/01/2013] calcitRIOL  0.5 mcg Oral Daily  . [START ON 03/01/2013] cholecalciferol  2,000 Units Oral Daily  . ferrous sulfate  325 mg Oral BID WC  . [START ON 03/01/2013] fluticasone  2 spray Each Nare Daily  . hydrocortisone  25 mg Rectal BID  . levETIRAcetam  1,000 mg Oral Q2200  . [START ON 03/01/2013] levETIRAcetam  500 mg Oral q morning - 10a  . lurasidone  40 mg Oral QHS  . [START ON 03/01/2013] montelukast  10 mg Oral Daily  . [START ON 03/01/2013] multivitamin with minerals  1 tablet Oral Daily  . [START ON 03/01/2013] pantoprazole  40 mg Oral Daily  . [START ON 03/01/2013] pneumococcal 23 valent vaccine  0.5 mL Intramuscular Tomorrow-1000  . sodium chloride  3 mL Intravenous Q12H  . ursodiol  500  mg Oral BID   Continuous Infusions:  PRN Meds:.albuterol, ammonium lactate, polyethylene glycol, polyvinyl alcohol, sodium chloride  Allergies as of 02/28/2013 - Review Complete 02/28/2013  Allergen Reaction Noted  . Aspirin Nausea And Vomiting 04/06/2011  . Penicillins Itching and Swelling 12/22/2010  . Pseudoephedrine Itching and Swelling 05/27/2008  . Sulfa antibiotics Nausea And Vomiting 04/06/2011  . Tramadol hcl Itching and Nausea And Vomiting 06/25/2005  . Latex Itching and Rash 04/24/2011    Family History  Problem Relation Age of Onset  . Diabetes Mother   . Heart disease Mother   . COPD Mother     History   Social History  . Marital Status: Single    Spouse Name: N/A    Number of Children: N/A  .  Years of Education: N/A   Occupational History  . Not on file.   Social History Main Topics  . Smoking status: Never Smoker   . Smokeless tobacco: Never Used  . Alcohol Use: No  . Drug Use: No  . Sexual Activity: No   Other Topics Concern  . Not on file   Social History Narrative   Lives alone but uncle and his wife live next door and are her primary caregivers. Dorene Sorrow (uncle) can be reached at [phone].     Review of Systems: Unable to obtain from pt. (unknown). Physical Exam: Vital signs: Filed Vitals:   02/28/13 1530  BP: 135/70  Pulse: 93  Temp: 98.6 F (37 C)  Resp: 18     General:   Awake, Alert,  Well-developed, well-nourished, pleasant and cooperative in NAD, poor dentition Lungs:  Clear throughout to auscultation.   No wheezes, crackles, or rhonchi. No acute distress. Heart:  Regular rate and rhythm; no murmurs, clicks, rubs,  or gallops. Abdomen: RLQ and right mid-quadrant tenderness with voluntary guarding, soft, nondistended, +BS  Rectal:  Deferred  GI:  Lab Results:  Recent Labs  02/28/13 0835 02/28/13 1425  WBC 5.1 4.7  HGB 10.4* 9.3*  HCT 28.2* 24.4*  PLT 88* 72*   BMET  Recent Labs  02/26/13 1030 02/28/13 0835  NA  134* 136  K 3.9 4.1  CL 103 103  CO2 27 26  GLUCOSE 100* 112*  BUN 9 8  CREATININE 0.4 0.49*  CALCIUM 7.8*  7.9* 8.2*   LFT  Recent Labs  02/28/13 0835  PROT 7.7  ALBUMIN 2.3*  AST 180*  ALT 95*  ALKPHOS 188*  BILITOT 9.4*   PT/INR  Recent Labs  02/28/13 0930 02/28/13 1425  LABPROT >90.0* >90.0*  INR >10.00* >10.00*     Studies/Results: No results found.  Impression/Plan: 37 yo with PBC and significantly elevated INR likely due to progression of her liver disease. Epistaxis and bleeding gums likely due to mucosal bleeding from high INR. Would hold off on empiric antibiotics at this time. Continue Urso. No family member present to confirm whether her current mental status is her baseline but she does not appear overtly encephalopathic. Agree with Vit K. FFP recommended and ordered by primary team. Her mental illness may preclude her from being a liver transplant candidate but defer to [facility]. No emergent need to transfer to [name] at this time. If encephalopathy develops or INR continues to remain markedly elevated despite treatment then may need to readdress but hopefully she will improve in order to be discharged prior to her upcoming visit at [facility] this week. Avoid narcotic pain meds if pain meds needed due to difficulty knowing whether mental status change is due to meds or worsening liver function.    LOS: 0 days   Leovanni Bjorkman C.  02/28/2013, 6:02 PM

## 2013-02-28 NOTE — Progress Notes (Signed)
Patient began to have hives on face, back, and right arm at the very end of her first unit of FFP; she denies SOB, chills, headache, flank pain, N/V, and CP.  MD and lab notified.  MD gave order for IV benadryl and stated not to give second unit of FFPs.  Will continue to monitor patient.

## 2013-02-28 NOTE — ED Notes (Signed)
rn called to give report. Charge reported she will have someone call back

## 2013-02-28 NOTE — ED Notes (Signed)
md at bedside  Pt alert and oriented x4. Respirations even and unlabored, bilateral symmetrical rise and fall of chest. Skin warm and dry. In no acute distress. Denies needs.   

## 2013-03-01 DIAGNOSIS — G40909 Epilepsy, unspecified, not intractable, without status epilepticus: Secondary | ICD-10-CM | POA: Diagnosis present

## 2013-03-01 LAB — CBC
HCT: 23.9 % — ABNORMAL LOW (ref 36.0–46.0)
HCT: 23.2 % — ABNORMAL LOW (ref 36.0–46.0)
HCT: 27.3 % — ABNORMAL LOW (ref 36.0–46.0)
MCH: 30.3 pg (ref 26.0–34.0)
MCHC: 38.4 g/dL — ABNORMAL HIGH (ref 30.0–36.0)
MCHC: 36.6 g/dL — ABNORMAL HIGH (ref 30.0–36.0)
MCV: 78.9 fL (ref 78.0–100.0)
MCV: 78.9 fL (ref 78.0–100.0)
MCV: 80.5 fL (ref 78.0–100.0)
Platelets: 59 10*3/uL — ABNORMAL LOW (ref 150–400)
Platelets: 73 10*3/uL — ABNORMAL LOW (ref 150–400)
RBC: 3.03 MIL/uL — ABNORMAL LOW (ref 3.87–5.11)
RDW: 24.3 % — ABNORMAL HIGH (ref 11.5–15.5)
RDW: 24.3 % — ABNORMAL HIGH (ref 11.5–15.5)
WBC: 4.2 10*3/uL (ref 4.0–10.5)
WBC: 4.5 10*3/uL (ref 4.0–10.5)
WBC: 5.6 10*3/uL (ref 4.0–10.5)

## 2013-03-01 LAB — COMPREHENSIVE METABOLIC PANEL
AST: 139 U/L — ABNORMAL HIGH (ref 0–37)
Albumin: 2.1 g/dL — ABNORMAL LOW (ref 3.5–5.2)
Calcium: 7.5 mg/dL — ABNORMAL LOW (ref 8.4–10.5)
Creatinine, Ser: 0.33 mg/dL — ABNORMAL LOW (ref 0.50–1.10)
Glucose, Bld: 99 mg/dL (ref 70–99)
Total Protein: 6.5 g/dL (ref 6.0–8.3)

## 2013-03-01 NOTE — Progress Notes (Signed)
Pt voiced concern of feeling increasingly weak.  VS: BP 125/74; HR 90; O2 100%.  Informed Dr. Jarvis Newcomer.  CBC ordered.  Will continue to monitor pt.

## 2013-03-01 NOTE — Progress Notes (Signed)
FMTS Attending Admission Note: Averleigh Savary,MD I  have seen and examined this patient, reviewed their chart. I have discussed this patient with the resident. I agree with the resident's findings, assessment and care plan.  

## 2013-03-01 NOTE — H&P (Signed)
FMTS Attending Admission Note: Stephen Turnbaugh,MD I  have seen and examined this patient, reviewed their chart. I have discussed this patient with the resident. I agree with the resident's findings, assessment and care plan.  Briefly: 37 y/o female who presented with bleeding gum of few hrs duration,not on anticoagulant,bleeding has since admission stopped. She also complain of left shoulder pain of about 6/10 in severity which has been on going for few months now. Exam :HEENT: No blood around her gum currently,she has poor dentition with brownish teeth.Neuro: Grossly normal.CV: about 2/6 systolic murmur heart,RRR. Resp: WNL. Abd Benign. Ext: Mild  left should tenderness with rotation.  A/P: 1.Bleeding gum/Elevated INR: Likely due to liver dx/failure (PBC).    S/P FFP and Vit K    GI consult recommended.    Monitor coagulation profile.  2. Left shoulder pain:    Possible muscle strain.    Pain control as needed.    If persistent to obtain xray left shoulder.  3.Seizure/Asthma/Schizophrenia:     Stable on home medications.

## 2013-03-01 NOTE — Progress Notes (Signed)
Family Medicine Teaching Service Daily Progress Note Intern Pager: [phone]  Patient name: [name] Medical record number: [id number] Date of birth: [birth year] Age: 37 y.o. Gender: female  Primary Care Provider: Marena Chancy, MD Consultants: Gastroenterology Code Status: Full  Pt Overview and Major Events to Date:  10/4: Admitted for mucosal bleeding, INR >10 10/5: INR 4.75, no signs of bleeding  Assessment and Plan: [name] is a 37 y.o. female presenting with bleeding gums and recent nose bleeds and found to have elevated INR to >10. PMH is significant for primary biliary cirrhosis, epilepsy, MR, schizophrenia, asthma and GI bleeds.   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  - Albumin 2.1 - INR 4.75 (from >10) s/p vit K 10mg , FFP x2 - TBili 7.7 - No active bleeding - Low threshold for SBP treatment, no ppx indicated - Has appointment with [name] for evaluation of candidacy for liver transplantation 10/9 - neuro checks q4h  - recheck INR, CMP in AM  # Thrombocytopenia - Plt 59 without signs of bleeding.  - Recheck CBC in AM  # Primary biliary cirrhosis - Continue home ursodiol  # Epilepsy: stable, no signs of seizure activity  - Holding vimpat at this time with thought that risk of hepatotoxicity has greater effect on mortality at this time than seizure. Will defer administration of hepatotoxic medications to gastroenterology.  - continue home keppra  # Asthma: persistent, no acute exacerbation  - continue home symbicort, singulair and albuterol prn   # Schizophrenia, MR: mental status at baseline per uncle - continue home latuda  - Ammonia 86. Monitoring for encephalopathy  # Systolic murmur: Prior SEM without evidence on prior ECHOs for etiology (normal EF with hypermobile intra-atrial septum).  - Continue to monitor   FEN/GI: Regular diet, saline lock IV, home PPI and miralax  Prophylaxis: Auto-anticoagulated  Disposition: Continued  stabilization  Subjective: Pt without complaints of pain, eating well this morning. No complaints of bleeding in the hospital.   Objective: Temp:  [98.5 F (36.9 C)-99.4 F (37.4 C)] 98.9 F (37.2 C) (10/05 0451) Pulse Rate:  [93-106] 97 (10/05 0451) Resp:  [16-18] 16 (10/05 0451) BP: (105-135)/(61-74) 105/65 mmHg (10/05 0451) SpO2:  [98 %-100 %] 98 % (10/05 0451) Weight:  [149 lb 0.5 oz (67.6 kg)] 149 lb 0.5 oz (67.6 kg) (10/04 1156) Physical Exam: General: Sitting in chair eating breakfast, comfortable HEENT: MMM, orpharynx clear, no blood noted  Cardiovascular: RRR, II/VI SEM heard best at LSB, no edema Respiratory: CTAB, normal WOB  Abdomen: soft, mildly distended, +fluid wave, liver edge cm below costal margin, nontender  Skin: warm, dry, stable dark area on left ankle, not new bruise per patient  Neuro: AOx3, no focal deficits   Laboratory:  Recent Labs Lab 02/28/13 1425 02/28/13 2235 03/01/13 0439  WBC 4.7 4.2 4.5  HGB 9.3* 9.0* 8.9*  HCT 24.4* 23.9* 23.2*  PLT 72* 75* 59*    Recent Labs Lab 02/26/13 1030 02/28/13 0835 03/01/13 0439  NA 134* 136 141  K 3.9 4.1 3.8  CL 103 103 107  CO2 27 26 27   BUN 9 8 8   CREATININE 0.4 0.49* 0.33*  CALCIUM 7.8*  7.9* 8.2* 7.5*  PROT 7.0 7.7 6.5  BILITOT 9.2* 9.4* 7.7*  ALKPHOS 137* 188* 152*  ALT 86* 95* 75*  AST 155* 180* 139*  GLUCOSE 100* 112* 99   Imaging/Diagnostic Tests:   Hazeline Junker, MD 03/01/2013, 10:32 AM PGY-1, [facility] FPTS Intern pager:  [phone], text pages welcome

## 2013-03-01 NOTE — Progress Notes (Signed)
Patient ID: NIKESHIA KEETCH, female   DOB: 1976/03/08, 37 y.o.   MRN: [id number] [facility] Progress Note  Cheron A Debenedetto 37 y.o. 04-28-1976   Subjective: Lying in bed, awake. No complaints.  Objective: Vital signs: Filed Vitals:   03/01/13 0451  BP: 105/65  Pulse: 97  Temp: 98.9 F (37.2 C)  Resp: 16    Physical Exam: Gen: alert, no acute distress  Abd: soft, nontender, nondistended  Lab Results:  Recent Labs  02/28/13 0835 03/01/13 0439  NA 136 141  K 4.1 3.8  CL 103 107  CO2 26 27  GLUCOSE 112* 99  BUN 8 8  CREATININE 0.49* 0.33*  CALCIUM 8.2* 7.5*    Recent Labs  02/28/13 0835 03/01/13 0439  AST 180* 139*  ALT 95* 75*  ALKPHOS 188* 152*  BILITOT 9.4* 7.7*  PROT 7.7 6.5  ALBUMIN 2.3* 2.1*    Recent Labs  02/28/13 0835  02/28/13 2235 03/01/13 0439  WBC 5.1  < > 4.2 4.5  NEUTROABS 2.9  --   --   --   HGB 10.4*  < > 9.0* 8.9*  HCT 28.2*  < > 23.9* 23.2*  MCV 79.0  < > 78.9 78.9  PLT 88*  < > 75* 59*  < > = values in this interval not displayed.    Assessment/Plan: 37 yo with PBC and elevated INR > 10 on admit with jaundice. INR 4.75 today after Vit K and 1 Unit FFP and TB trending down. Appears that her mental status is at baseline. No evidence of encephalopathy. Continue supportive care. Outpt f/u at [facility] if INR does not continue to climb over the next 1-2 days. Dr. Andrez Grime to see tomorrow.   Carleton Vanvalkenburgh C. 03/01/2013, 11:58 AM

## 2013-03-02 ENCOUNTER — Other Ambulatory Visit: Payer: Self-pay | Admitting: Endocrinology

## 2013-03-02 DIAGNOSIS — K769 Liver disease, unspecified: Secondary | ICD-10-CM

## 2013-03-02 DIAGNOSIS — G40909 Epilepsy, unspecified, not intractable, without status epilepticus: Secondary | ICD-10-CM

## 2013-03-02 LAB — COMPREHENSIVE METABOLIC PANEL
ALT: 81 U/L — ABNORMAL HIGH (ref 0–35)
AST: 153 U/L — ABNORMAL HIGH (ref 0–37)
Alkaline Phosphatase: 160 U/L — ABNORMAL HIGH (ref 39–117)
BUN: 7 mg/dL (ref 6–23)
CO2: 26 mEq/L (ref 19–32)
Chloride: 103 mEq/L (ref 96–112)
GFR calc non Af Amer: >90 mL/min (ref >90)
Glucose, Bld: 103 mg/dL — ABNORMAL HIGH (ref 70–99)
Potassium: 3.8 mEq/L (ref 3.5–5.1)
Sodium: 138 mEq/L (ref 135–145)
Total Bilirubin: 9.6 mg/dL — ABNORMAL HIGH (ref 0.3–1.2)

## 2013-03-02 LAB — CBC
HCT: 26.3 % — ABNORMAL LOW (ref 36.0–46.0)
Platelets: 67 10*3/uL — ABNORMAL LOW (ref 150–400)
RBC: 3.28 MIL/uL — ABNORMAL LOW (ref 3.87–5.11)
WBC: 4.8 10*3/uL (ref 4.0–10.5)

## 2013-03-02 LAB — PROTIME-INR: Prothrombin Time: 37.2 seconds — ABNORMAL HIGH (ref 11.6–15.2)

## 2013-03-02 MED ORDER — LEVETIRACETAM 500 MG PO TABS
1000.0000 mg | ORAL_TABLET | Freq: Two times a day (BID) | ORAL | Status: DC
  Filled 2013-03-02: qty 2

## 2013-03-02 MED ORDER — LEVETIRACETAM 500 MG PO TABS: 1000.0000 mg | ORAL_TABLET | Freq: Two times a day (BID) | ORAL | Status: DC

## 2013-03-02 NOTE — Progress Notes (Signed)
MD notified that patient is having nose bleeds, very minute amount of drainage. MD stated to continue to monitor patient.  Will continue to monitor patient.

## 2013-03-02 NOTE — Progress Notes (Signed)
Patient discharge teaching given, including activity, diet, follow-up appoints, and medications. Patient verbalized understanding of all discharge instructions. IV access was d/c'd. Vitals are stable. Skin is intact except as charted in most recent assessments. Pt to be escorted out by NT, to be driven home by family (uncle).

## 2013-03-02 NOTE — Progress Notes (Signed)
FMTS Attending Note  I personally saw and evaluated the patient. The plan of care was discussed with the resident team. I agree with the assessment and plan as documented by the resident.   1. ESLD - INR improving, appreciate GI recs, patient did have one nosebleed last night however has otherwise been asymptomatic, no plan for further vit K/FFP at this time 2. Primary Biliary Cirrhosis - management per GI, has appointment at [facility] in 3 days 3. Chronic Medical Conditions Stable  Disposition: Stable from FM standpoint for discharge, resident team to discuss discharge planning with GI  Donnella Sham MD

## 2013-03-02 NOTE — Care Management Note (Addendum)
   CARE MANAGEMENT NOTE 03/02/2013  Patient:  [facility]   Account Number:  [id number]  Date Initiated:  03/01/2013  Documentation initiated by:  [facility]  Subjective/Objective Assessment:   ESLD: MELD 32 without renal involvement, Thrombocytopenia     Action/Plan:   03/02/2013 Met with pt who uses [facility] for [facility]. That agency was contacted and will continue [facility] services at d/c, will need copy of d/c summary.   Anticipated DC Date:     Anticipated DC Plan:  HOME W HOME HEALTH SERVICES      DC Planning Services  CM consult      Choice offered to / List presented to:             Status of service:  In process, will continue to follow Medicare Important Message given?   (If response is "NO", the following Medicare IM given date fields will be blank) Date Medicare IM given:   Date Additional Medicare IM given:    Discharge Disposition:    Per UR Regulation:    If discussed at Long Length of Stay Meetings, dates discussed:    Comments:   03/02/2013 [facility] agency notified of plan to d/c  Pt and need to resume [facility] services. D/C instructions faxed to that agency and call placed to notify of plan to d/c pt today.  Johny Shock RN MPH, case manager, [phone]

## 2013-03-02 NOTE — Progress Notes (Signed)
[facility] Progress Note  Subjective: The patient has no complaints except nosebleed last night. Seems to have stopped.  Objective: Vital signs in last 24 hours: Temp:  [98.4 F (36.9 C)-99.1 F (37.3 C)] 99.1 F (37.3 C) (10/06 0455) Pulse Rate:  [93-99] 99 (10/06 0455) Resp:  [18] 18 (10/06 0455) BP: (116-127)/(73-85) 121/85 mmHg (10/06 0455) SpO2:  [99 %-100 %] 100 % (10/06 0455) Weight change:    PE: Icteric in no acute distress  Lab Results: Results for orders placed during the hospital encounter of 02/28/13 (from the past 24 hour(s))  CBC     Status: Abnormal   Collection Time    03/01/13  7:55 PM      Result Value Range   WBC 5.6  4.0 - 10.5 K/uL   RBC 3.39 (*) 3.87 - 5.11 MIL/uL   Hemoglobin 10.0 (*) 12.0 - 15.0 g/dL   HCT 32.4 (*) 40.1 - 02.7 %   MCV 80.5  78.0 - 100.0 fL   MCH 29.5  26.0 - 34.0 pg   MCHC 36.6 (*) 30.0 - 36.0 g/dL   RDW 25.3 (*) 66.4 - 40.3 %   Platelets 73 (*) 150 - 400 K/uL  CBC     Status: Abnormal (Preliminary result)   Collection Time    03/02/13  6:18 AM      Result Value Range   WBC PENDING  4.0 - 10.5 K/uL   RBC 3.28 (*) 3.87 - 5.11 MIL/uL   Hemoglobin 9.7 (*) 12.0 - 15.0 g/dL   HCT 47.4 (*) 25.9 - 56.3 %   MCV 80.2  78.0 - 100.0 fL   MCH 29.6  26.0 - 34.0 pg   MCHC 36.9 (*) 30.0 - 36.0 g/dL   RDW 87.5 (*) 64.3 - 32.9 %   Platelets PENDING  150 - 400 K/uL  COMPREHENSIVE METABOLIC PANEL     Status: Abnormal   Collection Time    03/02/13  6:18 AM      Result Value Range   Sodium 138  135 - 145 mEq/L   Potassium 3.8  3.5 - 5.1 mEq/L   Chloride 103  96 - 112 mEq/L   CO2 26  19 - 32 mEq/L   Glucose, Bld 103 (*) 70 - 99 mg/dL   BUN 7  6 - 23 mg/dL   Creatinine, Ser 5.18 (*) 0.50 - 1.10 mg/dL   Calcium 8.0 (*) 8.4 - 10.5 mg/dL   Total Protein 6.9  6.0 - 8.3 g/dL   Albumin 2.2 (*) 3.5 - 5.2 g/dL   AST 841 (*) 0 - 37 U/L   ALT 81 (*) 0 - 35 U/L   Alkaline Phosphatase 160 (*) 39 - 117 U/L   Total Bilirubin 9.6 (*)  0.3 - 1.2 mg/dL   GFR calc non Af Amer >90  >90 mL/min   GFR calc Af Amer >90  >90 mL/min  PROTIME-INR     Status: Abnormal   Collection Time    03/02/13  6:18 AM      Result Value Range   Prothrombin Time 37.2 (*) 11.6 - 15.2 seconds   INR 3.96 (*) 0.00 - 1.49    Studies/Results: No results found.    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 Partly corrected with FFP and vitamin K Plan: Continue to correct coags. Watch for signs of encephalopathy or hepatorenal. Referral to [name] supposedly initiated by my partner as an outpatient. Will  try to determine status and if appropriate could consider hospital to hospital transfer if signs of fulminant hepatic failure arise.  Deondra Wigger C 03/02/2013, 8:52 AM

## 2013-03-02 NOTE — Progress Notes (Signed)
Family Medicine Teaching Service Daily Progress Note Intern Pager: [phone]  Patient name: [name] Medical record number: [id number] Date of birth: [birth year] Age: 37 y.o. Gender: female  Primary Care Provider: Marena Chancy, MD Consultants: Gastroenterology Code Status: Full  Pt Overview and Major Events to Date:  10/4: Admitted for mucosal bleeding, INR >10 10/5: INR 4.75, no signs of bleeding  Assessment and Plan: [name] is a 37 y.o. female presenting with bleeding gums and recent nose bleeds and found to have elevated INR to >10. PMH is significant for primary biliary cirrhosis, epilepsy, MR, schizophrenia, asthma and GI bleeds.   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  - Albumin 2.1 - INR 3.96 (from >10) s/p vit K 10mg , FFP x1 - TBili 9.6 - No active bleeding, slow nose bleed early this morning - Low threshold for SBP treatment, no ppx indicated - Has appointment with [name] for evaluation of candidacy for liver transplantation 10/9 - neuro checks q4h  - recheck INR, CMP in AM - 2nd FFP unit today per GI recs  # Thrombocytopenia - Plt 67 without signs of bleeding.  - Recheck CBC in AM  # Primary biliary cirrhosis - Continue home ursodiol  # Epilepsy: stable, no signs of seizure activity  - Holding vimpat at this time with thought that risk of hepatotoxicity has greater effect on mortality at this time than seizure. Will defer administration of hepatotoxic medications to gastroenterology.  - continue home keppra  # Asthma: persistent, no acute exacerbation  - continue home symbicort, singulair and albuterol prn   # Schizophrenia, MR: mental status at baseline - continue home latuda  - Ammonia 86. Monitoring for encephalopathy  # Systolic murmur: Prior SEM without evidence on prior ECHOs for etiology (normal EF with hypermobile intra-atrial septum).  - Continue to monitor   FEN/GI: Regular diet, saline lock IV, home PPI and miralax  Prophylaxis:  Auto-anticoagulated  Disposition: Continued stabilization  Subjective: Pt without complaints of pain, eating well this morning. Nose bleed early this am.  Objective: Temp:  [98.4 F (36.9 C)-99.1 F (37.3 C)] 99.1 F (37.3 C) (10/06 0455) Pulse Rate:  [93-99] 99 (10/06 0455) Resp:  [18] 18 (10/06 0455) BP: (116-127)/(73-85) 121/85 mmHg (10/06 0455) SpO2:  [99 %-100 %] 100 % (10/06 0455) Physical Exam: General: Sitting up in bed eating breakfast, comfortable HEENT: MMM, orpharynx clear, no blood noted  Cardiovascular: RRR, II/VI SEM heard best at LSB, no edema Respiratory: CTAB, normal WOB  Abdomen: soft, mildly distended, +fluid wave, liver edge cm below costal margin, nontender  Extremities: no LE edema Skin: warm, dry, no new bruising Neuro: AOx3, no focal deficits   Laboratory:  Recent Labs Lab 02/28/13 2235 03/01/13 0439 03/01/13 1955  WBC 4.2 4.5 5.6  HGB 9.0* 8.9* 10.0*  HCT 23.9* 23.2* 27.3*  PLT 75* 59* 73*    Recent Labs Lab 02/28/13 0835 03/01/13 0439 03/02/13 0618  NA 136 141 138  K 4.1 3.8 3.8  CL 103 107 103  CO2 26 27 26   BUN 8 8 7   CREATININE 0.49* 0.33* 0.35*  CALCIUM 8.2* 7.5* 8.0*  PROT 7.7 6.5 6.9  BILITOT 9.4* 7.7* 9.6*  ALKPHOS 188* 152* 160*  ALT 95* 75* 81*  AST 180* 139* 153*  GLUCOSE 112* 99 103*   Imaging/Diagnostic Tests:   Beverely Low, MD 03/02/2013, 8:28 AM PGY-1, [facility] FPTS Intern pager: [phone], text pages welcome

## 2013-03-03 ENCOUNTER — Telehealth: Payer: Self-pay | Admitting: Endocrinology

## 2013-03-03 LAB — PREPARE FRESH FROZEN PLASMA
Unit division: 0
Unit division: 0

## 2013-03-03 NOTE — Telephone Encounter (Signed)
Pt says she is taking the calcitriol 0.5 mg

## 2013-03-03 NOTE — Telephone Encounter (Signed)
Continue same dose, corrected calcium is normal now

## 2013-03-04 NOTE — Discharge Summary (Signed)
Family Medicine Teaching [facility] Discharge Summary  Patient name: [name] Medical record number: [id number] Date of birth: [birth year] Age: 37 y.o. Gender: female Date of Admission: 02/28/2013  Date of Discharge: 03/02/13 Admitting Physician: Janit Pagan, MD  Primary Care Provider: Marena Chancy, MD Consultants: GI  Indication for Hospitalization: elevated INR  Discharge Diagnoses/Problem List:  Patient Active Problem List   Diagnosis Date Noted  . Primary biliary cirrhosis 05/07/2011    Priority: High  . Schizophrenia, paranoid type 06/25/2012    Priority: Medium    Class: Chronic  . RECTAL BLEEDING, HX OF 06/14/2010    Priority: Medium  . ANEMIA 03/03/2010    Priority: Medium  . EPILEPSY 08/23/2006    Priority: Medium  . MENTAL RETARDATION 07/25/2006    Priority: Medium  . ASTHMA, PERSISTENT 07/25/2006    Priority: Medium  . Seizure disorder 03/01/2013  . Left shoulder pain 02/03/2013  . Toe pain 11/21/2012  . Fatigue 08/20/2012  . Foot pain 08/20/2012  . 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 Vision loss, bilateral 03/30/2012  . Menorrhagia 12/22/2010  . Unspecified vitamin D deficiency 09/13/2010  . HEMATURIA UNSPECIFIED 06/14/2010  . GERD 07/21/2008  . 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 CARDIAC FLOW MURMUR 10/16/2007  . ALLERGIC RHINITIS 07/30/2006  . OBESITY, NOS 07/25/2006  . 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 OSTEOARTHRITIS, LOWER LEG 07/25/2006    Disposition: home  Discharge Condition: stable  Discharge Exam:  General: Sitting up in bed eating breakfast, comfortable  HEENT: MMM, orpharynx clear, no blood noted  Cardiovascular: RRR, II/VI SEM heard best at LSB, no edema  Respiratory: CTAB, normal WOB  Abdomen: soft, mildly distended, +fluid wave, liver edge 3 cm below costal margin, nontender  Extremities: no LE edema  Skin: warm, dry, no new bruising  Neuro: AOx3, no focal deficits  Brief Hospital Course:  # ESLD: MELD 32 without  renal involvement. Admitted for mucosal bleeding (gums and nose) with INR >10. She was observed for 3 days without any signs of significant bleeding. INR was partially corrected with Vitamin K and 1 unit FFP to 3.96 on day of discharge. She had no signs of GI bleed or altered mental status and had no signs of SBP. She was discharged with the plan to follow-up with [name] on 10/9 to discuss transplant.  # Thrombocytopenia: Platelets 67 without signs of bleeding. This remained stable throughout hospitalization  # Primary biliary cirrhosis: Continued home ursodiol   # Epilepsy: stable, no signs of seizure activity. We stopped vimpat during hospitalization with thought that risk of hepatotoxicity has greater effect on mortality at this time than seizure. We consulted neurology and increased her keppra dose on their recommendation. We scheduled f/u with her neurologist to make any other adjustments needed to her antiepileptic regimen.  Issues for Follow Up:  Recheck INR, CBC. Ask about bleeding.   Follow up on transplant possibilities discussed with [name].  Follow up on any problems with stopping vimpat.  Significant Procedures: none  Significant Labs and Imaging:   Recent Labs Lab 03/01/13 0439 03/01/13 1955 03/02/13 0618  WBC 4.5 5.6 4.8  HGB 8.9* 10.0* 9.7*  HCT 23.2* 27.3* 26.3*  PLT 59* 73* 67*    Recent Labs Lab 02/26/13 1030 02/28/13 0835 03/01/13 0439 03/02/13 0618  NA 134* 136 141 138  K 3.9 4.1 3.8 3.8  CL 103 103 107 103  CO2 27 26 27 26   GLUCOSE 100* 112* 99 103*  BUN 9 8 8 7   CREATININE  0.4 0.49* 0.33* 0.35*  CALCIUM 7.8*  7.9* 8.2* 7.5* 8.0*  ALKPHOS 137* 188* 152* 160*  AST 155* 180* 139* 153*  ALT 86* 95* 75* 81*  ALBUMIN 2.3* 2.3* 2.1* 2.2*   Results/Tests Pending at Time of Discharge: none  Discharge Medications:    Medication List    STOP taking these medications       Lacosamide 100 MG Tabs      TAKE these medications       albuterol 90  MCG/ACT inhaler  Commonly known as:  PROVENTIL,VENTOLIN  Inhale 2 puffs into the lungs every 4 (four) hours as needed for wheezing.     ammonium lactate 12 % cream  Commonly known as:  LAC-HYDRIN  Apply topically as needed for dry skin.     ASTEPRO 0.15 % Soln  Generic drug:  Azelastine HCl  Place 1 spray into the nose daily. Uses 1 spray in both nostrils     budesonide-formoterol 160-4.5 MCG/ACT inhaler  Commonly known as:  SYMBICORT  Inhale 2 puffs into the lungs 2 (two) times daily.     calcitRIOL 0.5 MCG capsule  Commonly known as:  ROCALTROL  Take 1 capsule (0.5 mcg total) by mouth daily.     camphor-menthol lotion  Commonly known as:  SARNA  Apply topically as needed for itching.     cholecalciferol 1000 UNITS tablet  Commonly known as:  VITAMIN D  Take 2,000 Units by mouth daily.     COD LIVER OIL PO  Take 1 capsule by mouth daily.     diclofenac sodium 1 % Gel  Commonly known as:  VOLTAREN  Apply 2 g topically 4 (four) times daily. Apply to shoulder     ferrous sulfate 325 (65 FE) MG tablet  Take 1 tablet (325 mg total) by mouth 2 (two) times daily with a meal.     fluticasone 50 MCG/ACT nasal spray  Commonly known as:  FLONASE  Place 2 sprays into the nose daily.     GENTEAL 0.25-0.3 % Gel  Generic drug:  Carboxymethylcell-Hypromellose  Apply 1 drop to eye at bedtime.     hydrocortisone 25 MG suppository  Commonly known as:  ANUSOL-HC  Place 1 suppository (25 mg total) rectally 2 (two) times daily.     levETIRAcetam 500 MG tablet  Commonly known as:  KEPPRA  Take 2 tablets (1,000 mg total) by mouth 2 (two) times daily.     lurasidone 40 MG Tabs tablet  Commonly known as:  LATUDA  Take 40 mg by mouth at bedtime. hs     montelukast 10 MG tablet  Commonly known as:  SINGULAIR  Take 1 tablet (10 mg total) by mouth daily.     multivitamin tablet  Take 1 tablet by mouth daily.     omeprazole 40 MG capsule  Commonly known as:  PRILOSEC  Take 40 mg  by mouth daily.     polyethylene glycol packet  Commonly known as:  MIRALAX / GLYCOLAX  Take 17 g by mouth daily as needed (for constipation). In water or juice     REFRESH TEARS 0.5 % Soln  Generic drug:  carboxymethylcellulose  Place 1 drop into both eyes 4 (four) times daily as needed (for dry eyes).     sodium chloride 0.65 % nasal spray  Commonly known as:  OCEAN NASAL SPRAY  Place 1 spray into the nose as needed for congestion.     triamcinolone 0.025 % ointment  Commonly known  as:  KENALOG  Apply 1 application topically 2 (two) times daily.     trolamine salicylate 10 % cream  Commonly known as:  ASPERCREME/ALOE  Apply topically as needed.     ursodiol 500 MG tablet  Commonly known as:  ACTIGALL  Take 500 mg by mouth 2 (two) times daily.        Discharge Instructions: Please refer to Patient Instructions section of EMR for full details.  Patient was counseled important signs and symptoms that should prompt return to medical care, changes in medications, dietary instructions, activity restrictions, and follow up appointments.   Follow-Up Appointments: Follow-up Information   Follow up with [facility], MD On 03/05/2013. (Please keep your previously scheduled appointment)    Specialty:  Internal Medicine   Contact information:   [address] Medicine [place] [address] [phone]       Follow up with Uvaldo Rising, MD On 03/09/2013. (3:45pm)    Specialty:  [facility]   Contact information:   [address] [place] [address] [phone]       Follow up with Nilda Riggs, NP On 03/09/2013. (10:15am)    Specialty:  Nurse Practitioner   Contact information:   [address] [place] [address] [phone]       Beverely Low, MD 03/04/2013, 3:35 PM PGY-1, [facility]

## 2013-03-04 NOTE — Discharge Summary (Signed)
I agree with the discharge summary as documented.   Kimoni Pagliarulo MD  

## 2013-03-06 ENCOUNTER — Telehealth: Payer: Self-pay | Admitting: *Deleted

## 2013-03-06 NOTE — Telephone Encounter (Signed)
Called patient to cancel appt but no answer. Left her a message that her appt on Monday Oct 13th was canceled and to call the office with questions. Per cm to leave patient on schedule just in case she shows up.

## 2013-03-09 ENCOUNTER — Ambulatory Visit: Payer: Medicaid Other | Admitting: Nurse Practitioner

## 2013-03-09 ENCOUNTER — Encounter: Payer: Self-pay | Admitting: Family Medicine

## 2013-03-09 ENCOUNTER — Ambulatory Visit (INDEPENDENT_AMBULATORY_CARE_PROVIDER_SITE_OTHER): Payer: Medicaid Other | Admitting: Family Medicine

## 2013-03-09 VITALS — BP 102/69 | HR 92 | Ht 64.0 in | Wt 146.0 lb

## 2013-03-09 DIAGNOSIS — R252 Cramp and spasm: Secondary | ICD-10-CM

## 2013-03-09 DIAGNOSIS — Z23 Encounter for immunization: Secondary | ICD-10-CM

## 2013-03-09 DIAGNOSIS — R791 Abnormal coagulation profile: Secondary | ICD-10-CM

## 2013-03-09 DIAGNOSIS — G40909 Epilepsy, unspecified, not intractable, without status epilepticus: Secondary | ICD-10-CM

## 2013-03-09 DIAGNOSIS — R040 Epistaxis: Secondary | ICD-10-CM

## 2013-03-09 DIAGNOSIS — D696 Thrombocytopenia, unspecified: Secondary | ICD-10-CM

## 2013-03-09 NOTE — Patient Instructions (Signed)
Hospital Follow up - need to check lab work today, Dr. Randolm Idol will call you with the results  Right thumb pain/locking - ice the affected area 2-3 times per day for 15 minutes at a time, may consider purchasing an ACE wrist brace to help support the area  Return to office as needed.

## 2013-03-10 ENCOUNTER — Encounter: Payer: Self-pay | Admitting: Family Medicine

## 2013-03-10 LAB — PROTIME-INR: Prothrombin Time: 23.3 seconds — ABNORMAL HIGH (ref 11.6–15.2)

## 2013-03-10 LAB — CBC WITH DIFFERENTIAL/PLATELET
Basophils Absolute: 0.1 10*3/uL (ref 0.0–0.1)
Eosinophils Relative: 2 % (ref 0–5)
HCT: 28.5 % — ABNORMAL LOW (ref 36.0–46.0)
Lymphocytes Relative: 26 % (ref 12–46)
Lymphs Abs: 1.4 10*3/uL (ref 0.7–4.0)
MCV: 84.1 fL (ref 78.0–100.0)
Monocytes Absolute: 0.5 10*3/uL (ref 0.1–1.0)
Neutro Abs: 3.3 10*3/uL (ref 1.7–7.7)
Neutrophils Relative %: 62 % (ref 43–77)
RDW: 24.1 % — ABNORMAL HIGH (ref 11.5–15.5)
WBC: 5.4 10*3/uL (ref 4.0–10.5)

## 2013-03-10 NOTE — Assessment & Plan Note (Signed)
Patient has chronic thrombocytopenia. Platelets were decreased during recent hospital stay. -Check CBC today to monitor platelet counts.

## 2013-03-10 NOTE — Assessment & Plan Note (Signed)
Patient presents for hospital follow up for epistaxis (INR > 10 due to liver disease). Currently asymptomatic. - check INR today

## 2013-03-10 NOTE — Assessment & Plan Note (Signed)
Patient was taken off Vimpat during recent hospitalization due to associated bleeding risk. No recent seizure activity. -Patient to follow up with her Neurologist

## 2013-03-10 NOTE — Assessment & Plan Note (Signed)
Patient has had 3 episodes over the past 48 hours. No current symptoms. Exam not consistent with carpal tunnel syndrome.  - Will monitor clinically. Consider checking electrolytes is symptoms persist.

## 2013-03-10 NOTE — Progress Notes (Signed)
  Subjective:    Patient ID: [name], female    DOB: 10-15-1975, 37 y.o.   MRN: [id number]  HPI 37 y/o female with PMH of primary biliary cirrhosis, she was recently admitted to [facility] with bleeding from her gums, she was found to have a INR>10 due to her liver disease, she was given FFP and Vit. K, she did have one nose bleed during the stay, no evidence of acute blood loss anemia was identified, hemoglobin remained stable. Patient has remained asymptomatic since discharge, denies bleeding gums/epistaxis/melena. She had thrombocytopenia during her hospital stay and needs recheck today. She was also taken off of Vimpat due to increased bleeding risk from liver disease. Patient denies any recent procedures.   In regards to her Primary Biliary Cirrhosis she was recently seen at [facility]. They informed her that she is a transplant candidate and plans to go on the transplant list.   She also has complaint of cramping of the right thumb, has occurred 3 times in the past 48 hours, previously asymptomatic, states that her thumb get stuck in the flexed position, no popping sensation, stays that way for about an hour, no traumatic injury, no warmth or erythema, no change in grip strength per patient   Review of Systems  Constitutional: Negative for fever, chills and fatigue.  Respiratory: Negative for cough, choking and shortness of breath.   Cardiovascular: Negative for chest pain.  Hematological: Does not bruise/bleed easily.       Objective:   Physical Exam Vitals: reviewed and stable Gen: pleasant AAF, NAD HEENT: MMM, no gum bleeding, no epistaxis Cardiac: RRR, S1 and S2 present, no murmurs Resp: CTAB, normal effort Abd: soft, hepatomegally present, bowel sounds present Ext: no edema Skin: no active bleeding       Assessment & Plan:  Please see problem specific assessment and plan.

## 2013-03-16 ENCOUNTER — Telehealth: Payer: Self-pay | Admitting: Nurse Practitioner

## 2013-03-16 ENCOUNTER — Encounter: Payer: Self-pay | Admitting: Family Medicine

## 2013-03-16 ENCOUNTER — Ambulatory Visit (INDEPENDENT_AMBULATORY_CARE_PROVIDER_SITE_OTHER): Payer: Medicaid Other | Admitting: Family Medicine

## 2013-03-16 VITALS — BP 110/71 | HR 80 | Ht 64.0 in | Wt 147.5 lb

## 2013-03-16 DIAGNOSIS — M25519 Pain in unspecified shoulder: Secondary | ICD-10-CM

## 2013-03-16 DIAGNOSIS — M25512 Pain in left shoulder: Secondary | ICD-10-CM

## 2013-03-16 MED ORDER — DICLOFENAC SODIUM 1 % TD GEL: 2.0000 g | Freq: Four times a day (QID) | TRANSDERMAL | Status: DC

## 2013-03-16 NOTE — Patient Instructions (Signed)
For the shoulder pain, I would like to get an xray. You can go to [facility] to get it done.  Continue using the voltaren gel. Also, ice the shoulder for 3 times a day.   I will call you with the results of the xray of the shoulder.

## 2013-03-17 ENCOUNTER — Encounter: Payer: Self-pay | Admitting: Family Medicine

## 2013-03-17 NOTE — Progress Notes (Signed)
Patient ID: [name]    DOB: [name] 27, 1976, 37 y.o.   MRN: [id number] --- Subjective:  [name] is a 37 y.o.female who presents with left shoulder pain.  Pain has been ongoing for 1-2 months. Pain is located on the top of the shoulder and is also present in the biceps area.  Worst when she brings her arm above her head, worst at night, keeping her awake. She has been using the voltaren gel which has helped mildly. She has been doing the home exercises sporadically. She denies any weakness in the arm. She denies any numbness or tingling. Pain is 8/10 at its worst. She doesn't recall any trauma to the area.     ROS: see HPI Past Medical History: reviewed and updated medications and allergies. Social History: Tobacco: none  Objective: Filed Vitals:   03/16/13 1137  BP: 110/71  Pulse: 80    Physical Examination:   General appearance - alert, well appearing, and in no distress Shoulder, left - no joint effusion or soft tissue swelling, tenderness to palpation in posterior aspect of the shoulder, around the subacromial space, mild tenderness around [facility] joint.  90 degrees of shoulder abduction.  Negative empty can's. Reproducible pain with internal rotation, pain and weakness with subscapularis lift off, Hawkin's positive for pain, negative Neer's.

## 2013-03-17 NOTE — Assessment & Plan Note (Addendum)
Rotator cuff vs ostoarthritis altough she appears young for this.  - xray shoulder. WIll contact Dr. Dulce Sellar regarding any adverse effects of local steroid injection.  - continue voltaren gel and start icing - will get referral for physical therapy. This is a very important component to her therapy.  - if doesn't get better, consider sending her to sports medicine for ultrasound or refer her to get MRI.

## 2013-03-20 ENCOUNTER — Ambulatory Visit
Admission: RE | Admit: 2013-03-20 | Discharge: 2013-03-20 | Disposition: A | Discharge status: Home / Self Care | Payer: Medicaid Other | Source: Ambulatory Visit | Attending: Family Medicine | Admitting: Family Medicine

## 2013-03-20 ENCOUNTER — Encounter: Payer: Self-pay | Admitting: Family Medicine

## 2013-03-20 DIAGNOSIS — M25512 Pain in left shoulder: Secondary | ICD-10-CM

## 2013-03-20 MED ORDER — LEVETIRACETAM 500 MG PO TABS: 1000.0000 mg | ORAL_TABLET | Freq: Two times a day (BID) | ORAL | Status: DC

## 2013-04-27 ENCOUNTER — Ambulatory Visit (INDEPENDENT_AMBULATORY_CARE_PROVIDER_SITE_OTHER): Payer: Medicaid Other | Admitting: Family Medicine

## 2013-04-27 ENCOUNTER — Encounter: Payer: Self-pay | Admitting: Family Medicine

## 2013-04-27 VITALS — BP 115/75 | HR 97 | Temp 98.6°F | Ht 64.0 in | Wt 161.0 lb

## 2013-04-27 DIAGNOSIS — R635 Abnormal weight gain: Secondary | ICD-10-CM | POA: Insufficient documentation

## 2013-04-27 DIAGNOSIS — R040 Epistaxis: Secondary | ICD-10-CM

## 2013-04-27 DIAGNOSIS — Z111 Encounter for screening for respiratory tuberculosis: Secondary | ICD-10-CM

## 2013-04-27 LAB — PROTIME-INR: Prothrombin Time: 30.2 seconds — ABNORMAL HIGH (ref 11.6–15.2)

## 2013-04-27 NOTE — Addendum Note (Signed)
Addended by: Radene Ou on: 04/27/2013 04:53 PM   Modules accepted: Orders

## 2013-04-27 NOTE — Assessment & Plan Note (Signed)
Patient has had a 13 pound weight gain since previous visit on 03/16/2013. Suspect volume retention due to end-stage liver disease as the primary cause. Patient is currently asymptomatic from a respiratory standpoint. -Patient to return to clinic for followup with her primary care physician in one to 2 weeks and recheck of her weight. May consider initiation of low-dose diuretic at that time if she continues to gain weight. -Patient was counseled to have continual followup with her GI physicians at [facility]

## 2013-04-27 NOTE — Progress Notes (Signed)
   Subjective:    Patient ID: [name], female    DOB: 1975/07/02, 37 y.o.   MRN: [id number]  HPI 37 year old female with past medical history of primary biliary cirrhosis and end-stage liver disease presents for evaluation of epistaxis for the past 2 weeks, the bleeding has occurred daily and is intermittent in nature, it is relieved with pressure to the nose, she also reports intermittent cough and sore throat, she reports a flare of her asthma about 2 weeks ago, denies current shortness of breath, no chest pain, no active epistaxis, she denies use of blood thinners or NSAIDs, she is seen at [facility] for her end-stage liver disease and is in the process of being cleared for liver transplant, of note she does have a history of elevated INR secondary to end-stage renal disease with resultant hematuria and epistaxis  She also reports a weight gain from previous visits, denies any change in her abdominal ascites, reports that her lower extremity edema is at baseline, denies shortness of breath, she is not currently on a diuretic to to previous intolerance from orthostasis   Review of Systems  Constitutional: Negative for fever, chills and fatigue.  HENT: Positive for congestion and rhinorrhea. Negative for trouble swallowing.   Respiratory: Negative for cough, choking, chest tightness and shortness of breath.   Cardiovascular: Negative for chest pain.  Gastrointestinal: Positive for diarrhea. Negative for nausea.       Objective:   Physical Exam Vitals: Reviewed HEENT: Normocephalic, bilateral TMs are pearly-gray without erythema or exudate, pupils are equal round and reactive to light, extraocular movements are intact, scleral icterus was present, nasal septum was midline, dried blood in the bilateral turbinates, no active epistaxis, moist mucous membranes, uvula midline, no pharyngeal erythema or exudate was noted, neck was supple, no anterior or posterior cervical lymphadenopathy Abdomen:  Soft, nontender, hepatomegaly noted Cardiac: Regular rate and rhythm, S1 and S2 present, no murmurs Respiratory: Clear to auscultation bilaterally, normal effort Extremities: bilateral pitting edema        Assessment & Plan:  Please see problem specific assessment and plan.

## 2013-04-27 NOTE — Patient Instructions (Addendum)
Bloody Nose - have INR checked today, Dr. Randolm Idol will call you with the results, please used humidified air and nasal saline drops to help improve the dryness in your nose.   Increased weight - this may be due to your liver disease, please follow up with Dr. Gwenlyn Saran in 1-2 weeks for weight check, may consider adding diuretic medications at that time.

## 2013-04-28 ENCOUNTER — Telehealth: Payer: Self-pay | Admitting: Family Medicine

## 2013-04-28 NOTE — Telephone Encounter (Signed)
Spoke to patient, still having intermittent bloody nose, able to stop the bleeding with packing/pressure. Her INR is elevated however this is chronic. Informed patient that if she can not stop the bleeding she will need to call our office/go to the ER for possible coagulation.  She informed me that she has an upcoming cataract surgery, I called [facility] and informed Dr. Clarita Crane nurse of the elevated INR  Patient was informed that this may interfere with her being able to have the cataract surger.

## 2013-05-05 ENCOUNTER — Other Ambulatory Visit: Payer: Self-pay | Admitting: Gastroenterology

## 2013-05-05 DIAGNOSIS — K7460 Unspecified cirrhosis of liver: Secondary | ICD-10-CM

## 2013-05-12 ENCOUNTER — Encounter: Payer: Self-pay | Admitting: Family Medicine

## 2013-05-12 ENCOUNTER — Ambulatory Visit (INDEPENDENT_AMBULATORY_CARE_PROVIDER_SITE_OTHER): Payer: Medicaid Other | Admitting: Family Medicine

## 2013-05-12 VITALS — BP 140/62 | HR 98 | Temp 98.1°F | Ht 64.0 in | Wt 166.4 lb

## 2013-05-12 DIAGNOSIS — R300 Dysuria: Secondary | ICD-10-CM

## 2013-05-12 DIAGNOSIS — N912 Amenorrhea, unspecified: Secondary | ICD-10-CM

## 2013-05-12 DIAGNOSIS — K743 Primary biliary cirrhosis: Secondary | ICD-10-CM

## 2013-05-12 DIAGNOSIS — K745 Biliary cirrhosis, unspecified: Secondary | ICD-10-CM

## 2013-05-12 LAB — POCT URINALYSIS DIPSTICK
Glucose, UA: NEGATIVE
Leukocytes, UA: NEGATIVE
Nitrite, UA: NEGATIVE
Urobilinogen, UA: 0.2

## 2013-05-12 LAB — CBC
HCT: 28.5 % — ABNORMAL LOW (ref 36.0–46.0)
MCH: 31.6 pg (ref 26.0–34.0)
MCHC: 37.5 g/dL — ABNORMAL HIGH (ref 30.0–36.0)
MCV: 84.1 fL (ref 78.0–100.0)
Platelets: 60 10*3/uL — ABNORMAL LOW (ref 150–400)
RBC: 3.39 MIL/uL — ABNORMAL LOW (ref 3.87–5.11)
RDW: 21.4 % — ABNORMAL HIGH (ref 11.5–15.5)

## 2013-05-12 LAB — COMPREHENSIVE METABOLIC PANEL
ALT: 95 U/L — ABNORMAL HIGH (ref 0–35)
AST: 181 U/L — ABNORMAL HIGH (ref 0–37)
Albumin: 1.8 g/dL — ABNORMAL LOW (ref 3.5–5.2)
Alkaline Phosphatase: 142 U/L — ABNORMAL HIGH (ref 39–117)
Sodium: 135 mEq/L (ref 135–145)
Total Bilirubin: 16.4 mg/dL — ABNORMAL HIGH (ref 0.3–1.2)
Total Protein: 7 g/dL (ref 6.0–8.3)

## 2013-05-12 LAB — PROTIME-INR: Prothrombin Time: 30.2 seconds — ABNORMAL HIGH (ref 11.6–15.2)

## 2013-05-12 LAB — POCT URINE PREGNANCY: Preg Test, Ur: NEGATIVE

## 2013-05-12 MED ORDER — FUROSEMIDE 40 MG PO TABS: 40.0000 mg | ORAL_TABLET | Freq: Every day | ORAL | Status: DC

## 2013-05-12 MED ORDER — LACTULOSE 10 GM/15ML PO SOLN: 30.0000 g | Freq: Three times a day (TID) | ORAL | Status: DC

## 2013-05-12 MED ORDER — SPIRONOLACTONE 100 MG PO TABS: 100.0000 mg | ORAL_TABLET | Freq: Every day | ORAL | Status: DC

## 2013-05-12 NOTE — Addendum Note (Signed)
Addended by: Marena Chancy E on: 05/12/2013 11:50 PM   Modules accepted: Level of Service

## 2013-05-12 NOTE — Patient Instructions (Addendum)
Like we talked about, taking the lasix, Aldactone, lactulose. I will call you with the results. Please follow up with me in one week. If you have worsening coughing of blood, runny bloody nose, rectal bleeding or trouble breathing go to the emergency room.

## 2013-05-12 NOTE — Progress Notes (Signed)
Patient ID: [name]    DOB: 08-07-1975, 37 y.o.   MRN: [id number] --- Subjective:  [name] is a 37 y.o.female who presents for follow up on visit from 04/27/13. At the time, she had had significant weight gain and was also reporting epistaxis. Since then, she states that she continues to have regular nose bleeds. Last one was this morning. It only lasted a couple of minutes and resolved with pressure. She has also been having dark blood tinged sputum when she coughs. THis has been ongoing since her last visit and has been somewhat less,per her report. It occurs intermittently. Not every day. She continues to have rectal bleeding but this occurs intermittently and chronically for her and is not new or significantly different. She denies any NSAID or ASA use.   She also reports increased abdominal swelling and lower extremity swelling. She has been having a bit more trouble breathing since the swelling has been more significant. She complains of pain in her right quadrant which is constant. Last bowel movement was soft and was today. She denies any dysuria or increased urinary frequency. She denies any fevers or chills.   ROS: see HPI Past Medical History: reviewed and updated medications and allergies. Social History: Tobacco: none  Objective: Filed Vitals:   05/12/13 1333  BP: 140/62  Pulse: 98  Temp: 98.1 F (36.7 C)    Physical Examination:   General appearance - alert, in no distress Eyes - significant scleral icterus Nose - crusted blood in bilaterally nasal turbinates Chest - clear to auscultation, no wheezes, rales or rhonchi, symmetric air entry  Heart - normal rate, regular rhythm, normal S1, S2, 3/6 systolic murmur Abdomen - markedly distended, tender to palpation in right upper quadrant, no guarding, edema present, fluid wave present Extremities - 2+ pre-tibial pitting edema Neuro - hand tremor more significant on left side than right. No epistaxis.

## 2013-05-12 NOTE — Assessment & Plan Note (Addendum)
Significant ascites on exam with 6lb weight gain since her last visit on 04/28/13. She is also having associated coagulopathy with epistaxis and reported hemoptysis. Her abdominal pain is also likely associated with the cirrhosis.  - will attempt to treat ascites with medical management: lasix 40mg  daily and spironolactone 100mg  daily. LAst recent BMP had low/normal Cr with normal K. - if medical management not providing significant relief, will set her up for paracentesis.  - lactulose for treatment of constipation and any future encephalopathy - check CBC for platelet and Hg in context of reported bleeding. Platelets for her are chronically low but would like to check that it still at baseline.  - check INR. It has been elevated previously with baseline between 3 and 4. WIll check to make sure INR is not greater than 10. - CMP for overall trend of liver function - patient scheduled by D. Outlaw for abdominal US on 12.22/14. She also has an appointment with him on 05/26/13. Will see about touching base with him sooner for any additional recommendations prior to her follow up visit with him.

## 2013-05-14 ENCOUNTER — Encounter (HOSPITAL_COMMUNITY): Payer: Self-pay | Admitting: Emergency Medicine

## 2013-05-14 ENCOUNTER — Emergency Department (HOSPITAL_COMMUNITY)
Admission: EM | Admit: 2013-05-14 | Discharge: 2013-05-15 | Disposition: A | Discharge status: Home / Self Care | Payer: Medicaid Other | Attending: Emergency Medicine | Admitting: Emergency Medicine

## 2013-05-14 ENCOUNTER — Telehealth: Payer: Self-pay | Admitting: Family Medicine

## 2013-05-14 DIAGNOSIS — F79 Unspecified intellectual disabilities: Secondary | ICD-10-CM | POA: Insufficient documentation

## 2013-05-14 DIAGNOSIS — Z9089 Acquired absence of other organs: Secondary | ICD-10-CM | POA: Insufficient documentation

## 2013-05-14 DIAGNOSIS — G40909 Epilepsy, unspecified, not intractable, without status epilepticus: Secondary | ICD-10-CM | POA: Insufficient documentation

## 2013-05-14 DIAGNOSIS — Z79899 Other long term (current) drug therapy: Secondary | ICD-10-CM | POA: Insufficient documentation

## 2013-05-14 DIAGNOSIS — IMO0002 Reserved for concepts with insufficient information to code with codable children: Secondary | ICD-10-CM | POA: Insufficient documentation

## 2013-05-14 DIAGNOSIS — E559 Vitamin D deficiency, unspecified: Secondary | ICD-10-CM | POA: Insufficient documentation

## 2013-05-14 DIAGNOSIS — Z9889 Other specified postprocedural states: Secondary | ICD-10-CM | POA: Insufficient documentation

## 2013-05-14 DIAGNOSIS — K5900 Constipation, unspecified: Secondary | ICD-10-CM | POA: Insufficient documentation

## 2013-05-14 DIAGNOSIS — R141 Gas pain: Secondary | ICD-10-CM | POA: Insufficient documentation

## 2013-05-14 DIAGNOSIS — J45909 Unspecified asthma, uncomplicated: Secondary | ICD-10-CM | POA: Insufficient documentation

## 2013-05-14 DIAGNOSIS — Z880 Allergy status to penicillin: Secondary | ICD-10-CM | POA: Insufficient documentation

## 2013-05-14 DIAGNOSIS — K219 Gastro-esophageal reflux disease without esophagitis: Secondary | ICD-10-CM | POA: Insufficient documentation

## 2013-05-14 DIAGNOSIS — Z791 Long term (current) use of non-steroidal anti-inflammatories (NSAID): Secondary | ICD-10-CM | POA: Insufficient documentation

## 2013-05-14 DIAGNOSIS — K745 Biliary cirrhosis, unspecified: Secondary | ICD-10-CM | POA: Insufficient documentation

## 2013-05-14 DIAGNOSIS — D649 Anemia, unspecified: Secondary | ICD-10-CM | POA: Insufficient documentation

## 2013-05-14 DIAGNOSIS — Z91040 Latex allergy status: Secondary | ICD-10-CM | POA: Insufficient documentation

## 2013-05-14 DIAGNOSIS — M791 Myalgia, unspecified site: Secondary | ICD-10-CM

## 2013-05-14 DIAGNOSIS — Z8742 Personal history of other diseases of the female genital tract: Secondary | ICD-10-CM | POA: Insufficient documentation

## 2013-05-14 DIAGNOSIS — M1710 Unilateral primary osteoarthritis, unspecified knee: Secondary | ICD-10-CM | POA: Insufficient documentation

## 2013-05-14 DIAGNOSIS — IMO0001 Reserved for inherently not codable concepts without codable children: Secondary | ICD-10-CM | POA: Insufficient documentation

## 2013-05-14 DIAGNOSIS — R142 Eructation: Secondary | ICD-10-CM | POA: Insufficient documentation

## 2013-05-14 LAB — CBC WITH DIFFERENTIAL/PLATELET
Basophils Relative: 1 % (ref 0–1)
Eosinophils Absolute: 0.1 10*3/uL (ref 0.0–0.7)
Eosinophils Relative: 2 % (ref 0–5)
HCT: 28.5 % — ABNORMAL LOW (ref 36.0–46.0)
Hemoglobin: 10.8 g/dL — ABNORMAL LOW (ref 12.0–15.0)
Lymphocytes Relative: 24 % (ref 12–46)
Lymphs Abs: 1.3 10*3/uL (ref 0.7–4.0)
MCH: 31.3 pg (ref 26.0–34.0)
MCHC: 37.9 g/dL — ABNORMAL HIGH (ref 30.0–36.0)
Monocytes Absolute: 0.7 10*3/uL (ref 0.1–1.0)
Neutro Abs: 3.2 10*3/uL (ref 1.7–7.7)
Neutrophils Relative %: 60 % (ref 43–77)
Platelets: 62 10*3/uL — ABNORMAL LOW (ref 150–400)
RBC: 3.45 MIL/uL — ABNORMAL LOW (ref 3.87–5.11)
RDW: 21.3 % — ABNORMAL HIGH (ref 11.5–15.5)

## 2013-05-14 LAB — COMPREHENSIVE METABOLIC PANEL
ALT: 100 U/L — ABNORMAL HIGH (ref 0–35)
Albumin: 1.9 g/dL — ABNORMAL LOW (ref 3.5–5.2)
Alkaline Phosphatase: 134 U/L — ABNORMAL HIGH (ref 39–117)
BUN: 8 mg/dL (ref 6–23)
CO2: 27 mEq/L (ref 19–32)
Creatinine, Ser: 0.51 mg/dL (ref 0.50–1.10)
GFR calc Af Amer: >90 mL/min (ref >90)
GFR calc non Af Amer: >90 mL/min (ref >90)
Glucose, Bld: 109 mg/dL — ABNORMAL HIGH (ref 70–99)
Potassium: 3.7 mEq/L (ref 3.5–5.1)
Sodium: 138 mEq/L (ref 135–145)

## 2013-05-14 NOTE — Telephone Encounter (Signed)
Called Jakylah back after just receiving her phone note.  She tells me that she has pain in both of her legs that started today at 2pm. It has not gotten any better and is a constant pain. She describes it as sharp and going from her thigh to her to feet.  Regarding her liver disease, she states that her abdomen is less painful than it had been when I last saw her but still hurts. She has been taking the lasix and spironolactone and lactulose which were prescribed this week.  Since I am not able to examine patient and cannot fully tell what is causing her pain and since she is a very fragile patient with end stage liver failure from primary biliary cirrhosis which has recently become more severe, I would prefer for her to be evaluated tonight. She has an elevated INR due to liver failure so DVT is less likely but potassium dysfunction causing the cramping in setting of newly added spiro and lasix cannot be excluded.  I spoke with her Uncle and he agreed to bring her for evaluation tonight. I am the [facility] resident on call tonight, so if there are any questions regarding her, I can be paged at [phone].  Marena Chancy, PGY-3 Family Medicine Resident

## 2013-05-14 NOTE — Telephone Encounter (Signed)
Pt called and would like Dr. Gwenlyn Saran to know that here leg began cramping up today around 2 o'clock. She would like Dr. Gwenlyn Saran to call her. jw

## 2013-05-14 NOTE — ED Notes (Signed)
Pt. reports intermittent fingers cramping and bilateral thigh pain onset this afternoon . Ambulatory .

## 2013-05-14 NOTE — ED Provider Notes (Signed)
CSN: [id number]     Arrival date & time 05/14/13  2044 History  This chart was scribed for non-physician practitioner, Antony Madura, PA-C, working with Hurman Horn, MD by Shari Heritage, ED Scribe. This patient was seen in room TR07C/TR07C and the patient's care was started at 11:33 PM.    Chief Complaint  Patient presents with  . Hand Pain   Patient is a 37 y.o. female presenting with extremity pain. The history is provided by the patient. No language interpreter was used.  Extremity Pain This is a recurrent problem. The current episode started 6 to 12 hours ago. The problem occurs constantly. The problem has been gradually worsening. Nothing relieves the symptoms. She has tried nothing for the symptoms.   HPI Comments: Inell A Castagna is a 37 y.o. female with history of biliary cirrhosis who presents to the Emergency Department complaining of intermittent sharp anterior bilateral leg pain and bilateral hand pain onset 2 PM this afternoon. She states that pain has progressively worsened since episode onset today. She reports that she has had this pain intermittently for months. She denies any relieving factors. She has a history of ulcer and is unable take ibuprofen. She cannot tolerate Tylenol due to her biliary cirrhosis. She denies fever, swelling of her hands, vomiting, or numbness in hands. She saw her PCP 2 days ago who thought she may be having pain secondary to high fluid volume. She is currently on Lasix. She is scheduled for an ultrasound of her liver on 05/18/13.   PCP - Marena Chancy   Past Medical History  Diagnosis Date  . Allergy   . Anemia   . Intellectual disability   . Constipation   . Abnormal vaginal bleeding   . Biliary cirrhosis 04/26/11  . 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 DUB (dysfunctional uterine bleeding)   . PONV (postoperative nausea and vomiting)   . Seizures     congenital epilepsy-hx. seizures  . Asthma 09-23-12    Asthma somewhat causing increase  wheezing and mild congestion at present  . GERD (gastroesophageal reflux disease)   . Arthritis     right knee  . Vitamin D deficiency    Past Surgical History  Procedure Laterality Date  . Appendectomy    . Cholecystectomy    . Eye muscle surgery    . Eustacean tubes    . Colonoscopy w/ biopsies    . Adenonoidectomy    . Esophagogastroduodenoscopy (egd) with propofol N/A 10/01/2012    Procedure: ESOPHAGOGASTRODUODENOSCOPY (EGD) WITH PROPOFOL;  Surgeon: Willis Modena, MD;  Location: [facility];  Service: Endoscopy;  Laterality: N/A;   Family History  Problem Relation Age of Onset  . Diabetes Mother   . Heart disease Mother   . COPD Mother    History  Substance Use Topics  . Smoking status: Never Smoker   . Smokeless tobacco: Never Used  . Alcohol Use: No   OB History   Grav Para Term Preterm Abortions TAB SAB Ect Mult Living                 Review of Systems  Constitutional: Negative for fever.  Gastrointestinal: Positive for abdominal distention. Negative for vomiting.  Musculoskeletal: Positive for myalgias.  Neurological: Negative for numbness.  All other systems reviewed and are negative.    Allergies  Tramadol hcl; Aspirin; Penicillins; Pseudoephedrine; Sulfa antibiotics; and Latex  Home Medications   Current Outpatient Rx  Name  Route  Sig  Dispense  Refill  .  albuterol (PROVENTIL,VENTOLIN) 90 MCG/ACT inhaler   Inhalation   Inhale 2 puffs into the lungs every 4 (four) hours as needed for wheezing.          Marland Kitchen ammonium lactate (AMLACTIN) 12 % cream   Topical   Apply 1 g topically daily as needed for dry skin.         . Azelastine HCl (ASTEPRO) 0.15 % SOLN   Nasal   Place 1 spray into the nose daily.          . budesonide-formoterol (SYMBICORT) 160-4.5 MCG/ACT inhaler   Inhalation   Inhale 2 puffs into the lungs 2 (two) times daily.          . calcitRIOL (ROCALTROL) 0.5 MCG capsule   Oral   Take 1 capsule (0.5 mcg total) by mouth  daily.   30 capsule   5   . camphor-menthol (SARNA) lotion   Topical   Apply 1 application topically daily as needed for itching.         . Carboxymethylcell-Hypromellose (GENTEAL) 0.25-0.3 % GEL   Both Eyes   Place 1 drop into both eyes at bedtime.          . carboxymethylcellulose (REFRESH TEARS) 0.5 % SOLN   Both Eyes   Place 1 drop into both eyes 4 (four) times daily as needed (for dry eyes).         . COD LIVER OIL PO   Oral   Take 1 capsule by mouth daily.         . diclofenac sodium (VOLTAREN) 1 % GEL   Topical   Apply 2 g topically 4 (four) times daily. Apply to shoulder   100 g   0   . ferrous sulfate 325 (65 FE) MG tablet   Oral   Take 1 tablet (325 mg total) by mouth 2 (two) times daily with a meal.   60 tablet   6   . fluticasone (FLONASE) 50 MCG/ACT nasal spray   Nasal   Place 2 sprays into the nose daily.         . furosemide (LASIX) 40 MG tablet   Oral   Take 1 tablet (40 mg total) by mouth daily.   30 tablet   3   . hydrocortisone (ANUSOL-HC) 25 MG suppository   Rectal   Place 1 suppository (25 mg total) rectally 2 (two) times daily.   12 suppository   0   . levETIRAcetam (KEPPRA) 500 MG tablet   Oral   Take 2 tablets (1,000 mg total) by mouth 2 (two) times daily.   120 tablet   11   . lurasidone (LATUDA) 40 MG TABS   Oral   Take 40 mg by mouth at bedtime.          . montelukast (SINGULAIR) 10 MG tablet   Oral   Take 1 tablet (10 mg total) by mouth daily.   30 tablet   12   . Multiple Vitamin (MULTIVITAMIN) tablet   Oral   Take 1 tablet by mouth daily.         Marland Kitchen omeprazole (PRILOSEC) 40 MG capsule   Oral   Take 40 mg by mouth daily.         . sodium chloride (OCEAN NASAL SPRAY) 0.65 % nasal spray   Nasal   Place 1 spray into the nose as needed for congestion.   30 mL   12   . spironolactone (ALDACTONE) 100 MG tablet  Oral   Take 1 tablet (100 mg total) by mouth daily.   30 tablet   2   . triamcinolone  (KENALOG) 0.025 % ointment   Topical   Apply 1 application topically 2 (two) times daily.         Marland Kitchen trolamine salicylate (ASPERCREME) 10 % cream   Topical   Apply 1 application topically daily as needed for muscle pain.         . ursodiol (ACTIGALL) 500 MG tablet   Oral   Take 500 mg by mouth 2 (two) times daily.         . Vitamin D, Ergocalciferol, (DRISDOL) 50000 UNITS CAPS capsule   Oral   Take 50,000 Units by mouth every 7 (seven) days. On sundays         . Heating Pads ([employer] HEATING PAD) PADS   Does not apply   1 application by Does not apply route 3 (three) times daily.   1 each   3   . lactulose (CHRONULAC) 10 GM/15ML solution   Oral   Take 45 mLs (30 g total) by mouth 4 (four) times daily.   960 mL   4   . polyethylene glycol (MIRALAX / GLYCOLAX) packet   Oral   Take 17 g by mouth daily as needed (for constipation). In water or juice          Triage Vitals: BP 119/71  Pulse 100  Temp(Src) 98.4 F (36.9 C) (Oral)  Resp 16  SpO2 100%  LMP 02/04/2013 Physical Exam  Nursing note and vitals reviewed. Constitutional: She is oriented to person, place, and time. She appears well-developed and well-nourished. No distress.  HENT:  Head: Normocephalic and atraumatic.  Mouth/Throat: Oropharynx is clear and moist. No oropharyngeal exudate.  Eyes: EOM are normal. Pupils are equal, round, and reactive to light. Scleral icterus (bilateral) is present.  Neck: Neck supple. No tracheal deviation present.  Cardiovascular: Normal rate, regular rhythm, normal heart sounds and intact distal pulses.   Pulses:      Radial pulses are 2+ on the right side, and 2+ on the left side.  Pulmonary/Chest: Effort normal and breath sounds normal. No respiratory distress. She has no wheezes. She has no rales.  Abdominal: She exhibits distension. There is no tenderness. There is no rebound.  Musculoskeletal: Normal range of motion.  Neurological: She is alert and oriented to person,  place, and time.  No gross sensory deficits appreciated. Moves extremities without ataxia. GCS 15.  Skin: Skin is warm and dry. No rash noted. No erythema. No pallor.  Capillary refills less than 3 seconds.  Psychiatric: She has a normal mood and affect. Her behavior is normal.    ED Course  Procedures (including critical care time) DIAGNOSTIC STUDIES: Oxygen Saturation is 100% on room air, normal by my interpretation.    COORDINATION OF CARE: 11:39 PM- Patient informed of current plan for treatment and evaluation and agrees with plan at this time.   Labs Review Labs Reviewed  CBC WITH DIFFERENTIAL - Abnormal; Notable for the following:    RBC 3.45 (*)    Hemoglobin 10.8 (*)    HCT 28.5 (*)    MCHC 37.9 (*)    RDW 21.3 (*)    Platelets 62 (*)    Monocytes Relative 13 (*)    All other components within normal limits  COMPREHENSIVE METABOLIC PANEL - Abnormal; Notable for the following:    Glucose, Bld 109 (*)    Calcium  8.1 (*)    Albumin 1.9 (*)    AST 197 (*)    ALT 100 (*)    Alkaline Phosphatase 134 (*)    Total Bilirubin 17.2 (*)    All other components within normal limits   Imaging Review No results found.  EKG Interpretation   None       MDM   1. Myalgia    Uncomplicated and specified myalgias - patient with an extensive past medical history including primary biliary cirrhosis for which she is followed by a primary care provider and gastroenterologist. Patient well and nontoxic appearing, hemodynamically stable, and afebrile. She is neurovascularly intact today on physical exam. GCS 15. Patient moves extremities without ataxia. She denies any trauma or injury to the area. No bony deformities, crepitus, swelling, or evidence of septic joint(s) appreciated. No electrolyte imbalance today. Labs today consistent with prior is from 48 hours ago. Patient stable and appropriate for discharge with primary care followup for further evaluation of symptoms. Return  precautions discussed and patient agreeable to plan with no unaddressed concerns.  I personally performed the services described in this documentation, which was scribed in my presence. The recorded information has been reviewed and is accurate.     Antony Madura, PA-C 05/23/13 1927

## 2013-05-15 NOTE — ED Provider Notes (Signed)
Medical screening examination/treatment/procedure(s) were performed by non-physician practitioner and as supervising physician I was immediately available for consultation/collaboration.  Hurman Horn, MD 05/15/13 2038

## 2013-05-18 ENCOUNTER — Telehealth: Payer: Self-pay | Admitting: *Deleted

## 2013-05-18 ENCOUNTER — Ambulatory Visit
Admission: RE | Admit: 2013-05-18 | Discharge: 2013-05-18 | Disposition: A | Discharge status: Home / Self Care | Payer: Medicaid Other | Source: Ambulatory Visit | Attending: Gastroenterology | Admitting: Gastroenterology

## 2013-05-18 DIAGNOSIS — K7460 Unspecified cirrhosis of liver: Secondary | ICD-10-CM

## 2013-05-18 NOTE — Telephone Encounter (Signed)
Dr Ladona Ridgel DDS, calling to inform Dr. Gwenlyn Saran that patient was seen by him today and has a tooth on there lower right side that most like will be needing extraction. Patient also complaining of some TMJ pain. Patient was prescribed Clindamycin 300mg  qid x 10 days today but he was unsure what to giver her for pain with her hx of liver failure, was hoping PCP could address this tomorrow at patients appointment. His office will be closed tomorrow but otherwise he can be reached at number provided. FYI to PCP

## 2013-05-19 ENCOUNTER — Encounter: Payer: Self-pay | Admitting: Family Medicine

## 2013-05-19 ENCOUNTER — Ambulatory Visit (INDEPENDENT_AMBULATORY_CARE_PROVIDER_SITE_OTHER): Payer: Medicaid Other | Admitting: Family Medicine

## 2013-05-19 VITALS — BP 138/71 | HR 91 | Temp 98.1°F | Ht 64.0 in | Wt 143.0 lb

## 2013-05-19 DIAGNOSIS — K743 Primary biliary cirrhosis: Secondary | ICD-10-CM

## 2013-05-19 DIAGNOSIS — K745 Biliary cirrhosis, unspecified: Secondary | ICD-10-CM

## 2013-05-19 DIAGNOSIS — R252 Cramp and spasm: Secondary | ICD-10-CM

## 2013-05-19 LAB — COMPREHENSIVE METABOLIC PANEL
ALT: 123 U/L — ABNORMAL HIGH (ref 0–35)
AST: 231 U/L — ABNORMAL HIGH (ref 0–37)
Alkaline Phosphatase: 131 U/L — ABNORMAL HIGH (ref 39–117)
Creat: 0.62 mg/dL (ref 0.50–1.10)
Sodium: 132 mEq/L — ABNORMAL LOW (ref 135–145)
Total Bilirubin: 20.5 mg/dL — ABNORMAL HIGH (ref 0.3–1.2)
Total Protein: 7.2 g/dL (ref 6.0–8.3)

## 2013-05-19 LAB — PROTIME-INR: Prothrombin Time: 34.8 seconds — ABNORMAL HIGH (ref 11.6–15.2)

## 2013-05-19 MED ORDER — CVS HEATING PAD PADS: 1.0000 "application " | MEDICATED_PAD | Freq: Three times a day (TID) | Status: DC

## 2013-05-19 MED ORDER — LACTULOSE 10 GM/15ML PO SOLN: 30.0000 g | Freq: Four times a day (QID) | ORAL | Status: DC

## 2013-05-19 NOTE — Assessment & Plan Note (Signed)
Bilateral thigh cramping. Unclear etiology at this point and whether it is related to musculoskeletal symptoms associated with primary biliary cirrhosis. There is some reproducible discomfort. Will try to avoid any medication that could make her liver worst. Will therefore try heating pads for now.

## 2013-05-19 NOTE — Progress Notes (Signed)
Patient ID: [name]    DOB: 1975/12/05, 37 y.o.   MRN: [id number] --- Subjective:  [name] is a 37 y.o.female with h/o primary biliary cirrhosis who presents for follow up from last week.  - primary biliary cirrhosis and liver failure: she states that her abdominal pain has improved. Her right upper quadrant is less painful. She has been taking the lasix 40 daily and spirinolactone 100mg  daily and feels like she has less swelling overall. She has also been taking lactulose three times a day and reports a bowel movement three times a day, but harder in consistency. She continues to have regular nose bleeds, but not any more frequently or longer in duration than previously.   - leg cramps: situated in the upper thighs bilaterally. Better with rest, worst with walking. No weakness or falls. No swelling of the area. Was evaluated in ED for this last week and was diagnosed with muscle cramps but not prescribed anything for it due to limitations of pain medicine choice with liver failure. She took a tylenol yesterday which did not help. She states that she has had this kind of pain in the past but that it is worst now.   She also reports difficulty opening and closing her hands, associated with pain if she forces it. Last occurrence last night, lasted a couple of hours. CUrrently is able to open and close her hand at will. Started a couple of weeks ago.   ROS: see HPI Past Medical History: reviewed and updated medications and allergies. Social History: Tobacco: none  Objective: Filed Vitals:   05/19/13 1419  BP: 138/71  Pulse: 91  Temp: 98.1 F (36.7 C)    Physical Examination:   General appearance - alert, well appearing, and in no distress Nose - crusted blood inside nose Chest - clear to auscultation, no wheezes, no crackles Heart - normal rate, regular rhythm, normal S1, S2, 3/6 systolic murmur Abdomen - soft, mildly tender in right upper quadrant, bowel sounds present, mildly distended  but improved compared to last week's exam Extremities - no pedal edema Thigh - tenderness to palpation bilaterally along adductor longus, no masses or cords, both thighs measuring 48cm in diameter bilaterally Neuro - normal hand grip bilaterally, trace patellar reflexes bilaterally, no clonus.  Patient has tremor on left more than right, unchanged from previous.

## 2013-05-19 NOTE — Assessment & Plan Note (Signed)
End stage liver failure. Symptoms of fluid overload seem to have improved mildly with diuresis. Patient's weight is drastically decreased compared to last week. Mild ascites on abdominal ultrasound done on 05/18/13.  Spoke with [name] liver transplant coordinator on 05/18/13 who was going to discuss case with physician. Patient also scheduled to see Dr. Dulce Sellar on 05/26/13.  - check Cr and K in setting of rapid and drastic diuresis.  - check CBC and INR and LFT's.

## 2013-05-19 NOTE — Patient Instructions (Signed)
For the muscle spasm, used a heating pad. I will call you if there is anything to change with your medicine based on your lab work. Increase the lactulose to 4 times a day Followup with Dr. Dulce Sellar on December 30

## 2013-05-20 ENCOUNTER — Telehealth: Payer: Self-pay | Admitting: *Deleted

## 2013-05-20 ENCOUNTER — Telehealth: Payer: Self-pay | Admitting: Family Medicine

## 2013-05-20 LAB — CBC

## 2013-05-20 NOTE — Telephone Encounter (Signed)
Called Maryann Conners at the [facility]atology clinic to inquire about recommendations regarding [place]. She ran the file by Dr. Huston Foley who at this point doesn't recommend anything different from their standpoint. She explained to me that before becoming elligible for a liver transplant, Nashiya needs to be living with a family member that can provide 24hr supervision to ensure that she would be taking the anti-rejection meds s/p transplant. With her history of schizophrenia and mild MR, they need guarantee that she would have 24hr supervision. The family has apparently not had a discussion about this yet. Her uncle told Eunice Blase that they would discuss it after Christmas. At this point, she is not eligible for the transplant.   I mentioned that she had been having some cramping of her thighs, Eunice Blase suggested that it could be from too rapid diuresis.  Called Azaryah and her Uncle to instruct them to stop the spironolactone 100mg  and lasix 40mg  daily for tomorrow and the next day and then start back on 1/2 a tablet of each (20mg  lasix and 50mg  spironolactone) on Saturday and follow up with Dr. Dulce Sellar on December 30th. Her Uncle expressed understanding and agreed with plan.   Marena Chancy, PGY-3 Family Medicine Resident

## 2013-05-20 NOTE — Telephone Encounter (Signed)
[facility] lab calling with critical lab value--T. Bili--20.5.  Will inform Dr. Gwenlyn Saran via text page in [place].  Gaylene Brooks, RN  Spoke with Dr. Manuela Neptune of lab results.  Patient is clinically stable.  Dr. Gwenlyn Saran will discuss with patient's hepatologist at [facility].  Gaylene Brooks, RN

## 2013-05-25 NOTE — ED Provider Notes (Signed)
Medical screening examination/treatment/procedure(s) were performed by non-physician practitioner and as supervising physician I was immediately available for consultation/collaboration.  Jarrett Albor M Joyceann Kruser, MD 05/25/13 1618 

## 2013-06-20 ENCOUNTER — Telehealth: Payer: Self-pay | Admitting: Family Medicine

## 2013-06-20 NOTE — Telephone Encounter (Signed)
Received call on Thursday 06/18/13 from Sharolyn Douglas from [facility] hepatology letting me know that [name] had been approved to be placed on the transplant list. She has an appointment on Monday at the [facility] to get a TB test placed. She is also asking for a preop EKG to be done at that time.   Please obtain EKG which I will fax to Sharolyn Douglas.  Thank you!  Liam Graham, PGY-3 Family Medicine Resident

## 2013-06-22 ENCOUNTER — Ambulatory Visit (INDEPENDENT_AMBULATORY_CARE_PROVIDER_SITE_OTHER): Payer: Medicaid Other | Admitting: *Deleted

## 2013-06-22 ENCOUNTER — Ambulatory Visit (HOSPITAL_COMMUNITY)
Admission: RE | Admit: 2013-06-22 | Discharge: 2013-06-22 | Disposition: A | Discharge status: Home / Self Care | Payer: Medicaid Other | Source: Ambulatory Visit | Attending: Family Medicine | Admitting: Family Medicine

## 2013-06-22 DIAGNOSIS — Z01810 Encounter for preprocedural cardiovascular examination: Secondary | ICD-10-CM | POA: Insufficient documentation

## 2013-06-22 DIAGNOSIS — Z01818 Encounter for other preprocedural examination: Secondary | ICD-10-CM

## 2013-06-22 MED ORDER — TUBERCULIN PPD 5 UNIT/0.1ML ID SOLN: 5.0000 [IU] | Freq: Once | INTRADERMAL | Status: DC

## 2013-06-22 NOTE — Progress Notes (Signed)
   Patient here today for nurse visit to complete pre-op EKG, PPD, and UDS per Dr. Otis Dials.  Tuberculin skin test applied to left ventral forearm.  Patient informed to schedule appt for nurse visit in 48-72 hours to have site read.  Obtained UDS and EKG.  Nolene Ebbs, RN

## 2013-06-23 LAB — DRUG SCREEN, URINE
Amphetamine Screen, Ur: NEGATIVE
BARBITURATE QUANT UR: NEGATIVE
Benzodiazepines.: NEGATIVE
CREATININE, U: 67.2 mg/dL
Cocaine Metabolites: NEGATIVE
Marijuana Metabolite: NEGATIVE
Methadone: NEGATIVE
Opiates: NEGATIVE
PHENCYCLIDINE (PCP): NEGATIVE
PROPOXYPHENE: NEGATIVE

## 2013-06-24 ENCOUNTER — Encounter: Payer: Self-pay | Admitting: *Deleted

## 2013-06-24 ENCOUNTER — Ambulatory Visit (INDEPENDENT_AMBULATORY_CARE_PROVIDER_SITE_OTHER): Payer: Medicaid Other | Admitting: *Deleted

## 2013-06-24 DIAGNOSIS — Z111 Encounter for screening for respiratory tuberculosis: Secondary | ICD-10-CM

## 2013-06-24 LAB — TB SKIN TEST
Induration: 0 mm
TB Skin Test: NEGATIVE

## 2013-06-28 DIAGNOSIS — Z944 Liver transplant status: Secondary | ICD-10-CM

## 2013-06-28 HISTORY — DX: Liver transplant status: Z94.4

## 2013-07-12 DIAGNOSIS — R6250 Unspecified lack of expected normal physiological development in childhood: Secondary | ICD-10-CM | POA: Insufficient documentation

## 2013-07-12 DIAGNOSIS — D689 Coagulation defect, unspecified: Secondary | ICD-10-CM | POA: Insufficient documentation

## 2013-07-12 HISTORY — DX: Unspecified lack of expected normal physiological development in childhood: R62.50

## 2013-07-28 DIAGNOSIS — Z48298 Encounter for aftercare following other organ transplant: Secondary | ICD-10-CM | POA: Insufficient documentation

## 2013-08-25 ENCOUNTER — Telehealth: Payer: Self-pay | Admitting: *Deleted

## 2013-08-25 NOTE — Telephone Encounter (Signed)
NPI number given, pt seen at [facility].  Derl Barrow, RN

## 2013-09-02 ENCOUNTER — Other Ambulatory Visit: Payer: Self-pay | Admitting: *Deleted

## 2013-09-02 MED ORDER — FLUTICASONE PROPIONATE 50 MCG/ACT NA SUSP: 2.0000 | Freq: Every day | NASAL | Status: DC

## 2013-09-11 ENCOUNTER — Telehealth: Payer: Self-pay | Admitting: Neurology

## 2013-09-11 ENCOUNTER — Telehealth: Payer: Self-pay | Admitting: Family Medicine

## 2013-09-11 NOTE — Telephone Encounter (Signed)
Patient calling to make Dr. [name] and Cecille Rubin aware that she got a new liver on Valentine's Day. If questions, please call patient.

## 2013-09-11 NOTE — Telephone Encounter (Signed)
noted 

## 2013-09-11 NOTE — Telephone Encounter (Signed)
Pt called to tell Dr. Otis Dials that she received a new liver on Valentines Day and she feels great and is gaining weight. Blima Rich

## 2013-09-11 NOTE — Telephone Encounter (Signed)
Pt calling stating that she just wanted to make Dr. [name] and Hoyle Sauer, NP aware that she got a new liver on Valentine's Day. If any questions, please call pt. FYI

## 2013-09-11 NOTE — Telephone Encounter (Signed)
Will fwd to MD.  Lazaro Arms, CMA

## 2013-09-25 ENCOUNTER — Telehealth: Payer: Self-pay | Admitting: *Deleted

## 2013-09-25 NOTE — Telephone Encounter (Signed)
Elsa from [facility] called requesting NPI number; NPI number given.  Derl Barrow, RN

## 2013-10-01 ENCOUNTER — Other Ambulatory Visit: Payer: Self-pay | Admitting: Family Medicine

## 2013-10-10 ENCOUNTER — Encounter (HOSPITAL_COMMUNITY): Payer: Self-pay | Admitting: Emergency Medicine

## 2013-10-10 ENCOUNTER — Emergency Department (HOSPITAL_COMMUNITY)
Admission: EM | Admit: 2013-10-10 | Discharge: 2013-10-12 | Disposition: A | Discharge status: Home / Self Care | Payer: Medicaid Other | Attending: Emergency Medicine | Admitting: Emergency Medicine

## 2013-10-10 DIAGNOSIS — Z880 Allergy status to penicillin: Secondary | ICD-10-CM | POA: Insufficient documentation

## 2013-10-10 DIAGNOSIS — Z8742 Personal history of other diseases of the female genital tract: Secondary | ICD-10-CM | POA: Insufficient documentation

## 2013-10-10 DIAGNOSIS — J45909 Unspecified asthma, uncomplicated: Secondary | ICD-10-CM | POA: Insufficient documentation

## 2013-10-10 DIAGNOSIS — Z862 Personal history of diseases of the blood and blood-forming organs and certain disorders involving the immune mechanism: Secondary | ICD-10-CM | POA: Insufficient documentation

## 2013-10-10 DIAGNOSIS — Z01812 Encounter for preprocedural laboratory examination: Secondary | ICD-10-CM

## 2013-10-10 DIAGNOSIS — Z91040 Latex allergy status: Secondary | ICD-10-CM | POA: Insufficient documentation

## 2013-10-10 DIAGNOSIS — Z791 Long term (current) use of non-steroidal anti-inflammatories (NSAID): Secondary | ICD-10-CM | POA: Insufficient documentation

## 2013-10-10 DIAGNOSIS — M1710 Unilateral primary osteoarthritis, unspecified knee: Secondary | ICD-10-CM | POA: Insufficient documentation

## 2013-10-10 DIAGNOSIS — F200 Paranoid schizophrenia: Secondary | ICD-10-CM | POA: Diagnosis present

## 2013-10-10 DIAGNOSIS — Z79899 Other long term (current) drug therapy: Secondary | ICD-10-CM | POA: Insufficient documentation

## 2013-10-10 DIAGNOSIS — K219 Gastro-esophageal reflux disease without esophagitis: Secondary | ICD-10-CM | POA: Insufficient documentation

## 2013-10-10 DIAGNOSIS — N938 Other specified abnormal uterine and vaginal bleeding: Secondary | ICD-10-CM

## 2013-10-10 DIAGNOSIS — IMO0002 Reserved for concepts with insufficient information to code with codable children: Secondary | ICD-10-CM | POA: Insufficient documentation

## 2013-10-10 DIAGNOSIS — G40909 Epilepsy, unspecified, not intractable, without status epilepticus: Secondary | ICD-10-CM | POA: Insufficient documentation

## 2013-10-10 DIAGNOSIS — Z8639 Personal history of other endocrine, nutritional and metabolic disease: Secondary | ICD-10-CM | POA: Insufficient documentation

## 2013-10-10 LAB — CBC
HCT: 26.4 % — ABNORMAL LOW (ref 36.0–46.0)
HEMOGLOBIN: 9 g/dL — AB (ref 12.0–15.0)
MCH: 27.1 pg (ref 26.0–34.0)
MCHC: 34.1 g/dL (ref 30.0–36.0)
MCV: 79.5 fL (ref 78.0–100.0)
PLATELETS: 118 10*3/uL — AB (ref 150–400)
RBC: 3.32 MIL/uL — ABNORMAL LOW (ref 3.87–5.11)
RDW: 13.7 % (ref 11.5–15.5)
WBC: 5 10*3/uL (ref 4.0–10.5)

## 2013-10-10 LAB — COMPREHENSIVE METABOLIC PANEL
ALK PHOS: 65 U/L (ref 39–117)
ALT: 12 U/L (ref 0–35)
AST: 15 U/L (ref 0–37)
Albumin: 4.6 g/dL (ref 3.5–5.2)
BUN: 22 mg/dL (ref 6–23)
CALCIUM: 8.7 mg/dL (ref 8.4–10.5)
CO2: 21 mEq/L (ref 19–32)
Chloride: 100 mEq/L (ref 96–112)
Creatinine, Ser: 0.97 mg/dL (ref 0.50–1.10)
GFR calc non Af Amer: 73 mL/min — ABNORMAL LOW (ref >90)
GFR, EST AFRICAN AMERICAN: 84 mL/min — AB (ref >90)
GLUCOSE: 82 mg/dL (ref 70–99)
POTASSIUM: 4.4 meq/L (ref 3.7–5.3)
SODIUM: 138 meq/L (ref 137–147)
TOTAL PROTEIN: 7.3 g/dL (ref 6.0–8.3)
Total Bilirubin: 0.5 mg/dL (ref 0.3–1.2)

## 2013-10-10 LAB — ETHANOL: Alcohol, Ethyl (B): <11 mg/dL (ref 0–11)

## 2013-10-10 LAB — RAPID URINE DRUG SCREEN, HOSP PERFORMED
Amphetamines: NOT DETECTED
BENZODIAZEPINES: NOT DETECTED
Barbiturates: NOT DETECTED
Cocaine: NOT DETECTED
OPIATES: NOT DETECTED
TETRAHYDROCANNABINOL: NOT DETECTED

## 2013-10-10 LAB — SALICYLATE LEVEL: Salicylate Lvl: <2 mg/dL — ABNORMAL LOW (ref 2.8–20.0)

## 2013-10-10 LAB — ACETAMINOPHEN LEVEL

## 2013-10-10 MED ORDER — ACETAMINOPHEN 325 MG PO TABS
650.0000 mg | ORAL_TABLET | ORAL | Status: DC | PRN
Start: 2013-10-10 — End: 2013-10-12

## 2013-10-10 MED ORDER — ONDANSETRON HCL 4 MG PO TABS: 4.0000 mg | ORAL_TABLET | Freq: Three times a day (TID) | ORAL | Status: DC | PRN

## 2013-10-10 MED ORDER — ZIPRASIDONE HCL 20 MG PO CAPS
20.0000 mg | ORAL_CAPSULE | Freq: Once | ORAL | Status: AC
  Administered 2013-10-10: 20 mg via ORAL
  Filled 2013-10-10: qty 1

## 2013-10-10 MED ORDER — ALUM & MAG HYDROXIDE-SIMETH 200-200-20 MG/5ML PO SUSP: 30.0000 mL | ORAL | Status: DC | PRN

## 2013-10-10 MED ORDER — LORAZEPAM 1 MG PO TABS
1.0000 mg | ORAL_TABLET | Freq: Three times a day (TID) | ORAL | Status: DC | PRN
  Filled 2013-10-10 (×2): qty 1
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
## 2013-10-10 NOTE — ED Notes (Signed)
When asked about seeing numbers and faces on the computer she did not give a direct answer, instead told a story about her computer use since 1997. Asked again about AVH, she then denied. Questions asked were answered appropriately/logically. When I left the room she was observed rubbing her arms, legs, waist area. Then began rubbing tissue box and up and down on the door/window. Very restless, back and forth from chair to bed. Pulling her pants up to her thighs and then pulled her top off and put it back on backwards.

## 2013-10-10 NOTE — ED Notes (Signed)
Patient pressed call button again. It was explained to patient it is for emergencies only, patient responded,"I need to rewire my house." It was explained to patient that this is hospital and we do not have that here patient stated, "I want to help the blind learn to cook."

## 2013-10-10 NOTE — ED Notes (Signed)
Patient restless, attempting to clean her entire room by scrubbing the walls and floors with her tissues. Pt taking her clothes off and needing redirection to put them on. Pt pacing her room. No distress needed. Pt able to be redirected, but quickly forgetting instructions.

## 2013-10-10 NOTE — ED Provider Notes (Signed)
  Medical screening examination/treatment/procedure(s) were performed by non-physician practitioner and as supervising physician I was immediately available for consultation/collaboration.   EKG Interpretation None         Carmin Muskrat, MD 10/10/13 2359

## 2013-10-10 NOTE — ED Notes (Signed)
Telepsych machine was brought into patient's room and while waiting for call patient read out random thing in the room like [employer], healthcare, green, 16, clock, LG: Life's Good and also cleaning the room using a tissue box as though it was a rag. During telepsych patient was pointing to random objects in the room and acted as though she was swatting away bugs.

## 2013-10-10 NOTE — ED Notes (Signed)
Patient continues to pull emergency button. When tech went into patient's room, patient observed cleaning the door with rubber gloves. Gloves taken away from patient. Patient now cleaning the door with a paper bag.
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
## 2013-10-11 DIAGNOSIS — F29 Unspecified psychosis not due to a substance or known physiological condition: Secondary | ICD-10-CM

## 2013-10-11 DIAGNOSIS — Z8659 Personal history of other mental and behavioral disorders: Secondary | ICD-10-CM

## 2013-10-11 LAB — CBG MONITORING, ED
GLUCOSE-CAPILLARY: 104 mg/dL — AB (ref 70–99)
Glucose-Capillary: 92 mg/dL (ref 70–99)
Glucose-Capillary: 85 mg/dL (ref 70–99)

## 2013-10-11 MED ORDER — MYCOPHENOLATE MOFETIL 250 MG PO CAPS
1000.0000 mg | ORAL_CAPSULE | Freq: Two times a day (BID) | ORAL | Status: DC
  Administered 2013-10-11 – 2013-10-12 (×3): 1000 mg via ORAL
  Filled 2013-10-11 (×4): qty 4

## 2013-10-11 MED ORDER — LEVETIRACETAM 500 MG PO TABS
1000.0000 mg | ORAL_TABLET | Freq: Two times a day (BID) | ORAL | Status: DC
  Administered 2013-10-11 – 2013-10-12 (×3): 1000 mg via ORAL
  Filled 2013-10-11 (×4): qty 2

## 2013-10-11 MED ORDER — VALGANCICLOVIR HCL 450 MG PO TABS
900.0000 mg | ORAL_TABLET | Freq: Every evening | ORAL | Status: DC
  Administered 2013-10-11: 900 mg via ORAL
  Filled 2013-10-11 (×3): qty 2

## 2013-10-11 MED ORDER — TACROLIMUS 1 MG PO CAPS
4.0000 mg | ORAL_CAPSULE | Freq: Every day | ORAL | Status: DC
  Administered 2013-10-11 – 2013-10-12 (×2): 4 mg via ORAL
  Filled 2013-10-11 (×2): qty 4

## 2013-10-11 MED ORDER — ADULT MULTIVITAMIN W/MINERALS CH
1.0000 | ORAL_TABLET | Freq: Every day | ORAL | Status: DC
  Administered 2013-10-11 – 2013-10-12 (×2): 1 via ORAL
  Filled 2013-10-11 (×2): qty 1

## 2013-10-11 MED ORDER — ZIPRASIDONE HCL 20 MG PO CAPS
20.0000 mg | ORAL_CAPSULE | Freq: Two times a day (BID) | ORAL | Status: DC
  Administered 2013-10-11: 20 mg via ORAL
  Filled 2013-10-11 (×2): qty 1

## 2013-10-11 MED ORDER — PREDNISONE 5 MG PO TABS
5.0000 mg | ORAL_TABLET | Freq: Every day | ORAL | Status: DC
  Administered 2013-10-11 – 2013-10-12 (×2): 5 mg via ORAL
  Filled 2013-10-11 (×3): qty 1

## 2013-10-11 MED ORDER — SENNOSIDES-DOCUSATE SODIUM 8.6-50 MG PO TABS
1.0000 | ORAL_TABLET | Freq: Two times a day (BID) | ORAL | Status: DC
  Administered 2013-10-11 – 2013-10-12 (×3): 1 via ORAL
  Filled 2013-10-11 (×3): qty 1

## 2013-10-11 MED ORDER — POLYVINYL ALCOHOL 1.4 % OP SOLN
1.0000 [drp] | Freq: Four times a day (QID) | OPHTHALMIC | Status: DC | PRN
  Filled 2013-10-11: qty 15

## 2013-10-11 MED ORDER — PANTOPRAZOLE SODIUM 40 MG PO TBEC
80.0000 mg | DELAYED_RELEASE_TABLET | Freq: Every day | ORAL | Status: DC
  Administered 2013-10-11 – 2013-10-12 (×2): 80 mg via ORAL
  Filled 2013-10-11 (×2): qty 2

## 2013-10-11 MED ORDER — LURASIDONE HCL 40 MG PO TABS
40.0000 mg | ORAL_TABLET | Freq: Every day | ORAL | Status: DC
  Administered 2013-10-12: 40 mg via ORAL
  Filled 2013-10-11 (×3): qty 1

## 2013-10-11 MED ORDER — TACROLIMUS 1 MG PO CAPS
3.0000 mg | ORAL_CAPSULE | Freq: Every day | ORAL | Status: DC
  Administered 2013-10-11: 3 mg via ORAL
  Filled 2013-10-11 (×2): qty 3

## 2013-10-11 NOTE — Progress Notes (Signed)
Per Dr. J and Josephine, NP - patient meets criteria for inpatient hospitalization.  No beds at [facility].  The Dispo Tech will refer patient to other facilities.  

## 2013-10-11 NOTE — Progress Notes (Signed)
Patient ID: [name], female   DOB: Nov 03, 1975, 38 y.o.   MRN: [id number] Attempted to evaluate patient with Dr Louretta Shorten but he is poor historian due to his current mental status.  Patient is confused, disoriented and could not respond to question linear and goal directed. .  Suddenly she said something unrelated to the question asked.   This afternoon, nurses reported patient telling them her blood sugar is brittle and drops very low at times.  Patient ate larg breakfast and blood was checked and it only 92..  She reports low sugar problem at home and that as soon as she eats and gets her sugar up she feels much better.  However, patient does not remember why and how she came to the hospital.  She remembers being at [facility] two months ago for Liver transplant.  She denies any hx of mental issue but reports suffering from Epilepsy.  Patient knows the year and month but did not know the name of the hospital  Plan:   We have discontinued her Geodon due to QT interval effect We have started patient on Latuda 40g po daily with breakfast We will obtain blood sugar check bid and prn to monitor her blood sugar trend She will be offered her medications for her medical condition We will continue to look for a bed for her admission.  Diagnosis:  Psychotic d/o, Hx of Paranoid Schizophrenia  [name]   PMHNP-BC  Patient was seen face to face with physician extender and formulated treatment plan. Reviewed the information documented and agree with the treatment plan.  [name] 10/11/2013 4:31 PM

## 2013-10-11 NOTE — ED Notes (Signed)
Pt agitated, asking for fruit, ran out of room and attempted to exit through unit door.  Pt bumped her lip on the unit door when she tried to exit the unit.  Small drop of blood noted on lip.   Pt given apple juice and her breakfast tray.  Pt's scheduled geodon administered.  RN continuing to monitor.

## 2013-10-11 NOTE — Progress Notes (Signed)
Pt's referral has been faxed to the following facilities for inptx with bed availability:   [place]- per [facility] beds available  [facility]- per Suanne Marker can fax  [facility]- per [facility] Chapel beds available  [facility]- per Juliann Pulse beds available  [place]- per Vaughan Basta can fax  [facility]- per Michel Bickers female beds available  The following hospitals contacted are at capacity:  [facility] [place]- per [facility]- per [facility]- per Mar[facility] Baumgarten- per [facility]- per [facility]- per [facility]- per [facility]- per [facility]- per [facility]- per [facility]- per [facility]- per Opal Sidles only female beds available     [facility] Disposition MHT

## 2013-10-11 NOTE — ED Notes (Signed)
Patient is confused and disoriented. Patient eating breakfast stating her blood sugar is low and she is shaking, she had a liver transplant so she needs sugar, she has diabetes so she doesn't need sugar, she wants chocolate, she wants fruit, she had cataracts surgery, she needs to see her nurse from her family practice, she states she is not in the hospital so we need to call 911.

## 2013-10-11 NOTE — Progress Notes (Signed)
Kathy from [facility] has been declined d/t  Intellectual disability and being too medically acute.   [facility] D[facility] Disposition MHT

## 2013-10-12 ENCOUNTER — Encounter (HOSPITAL_COMMUNITY): Payer: Self-pay | Admitting: Psychiatry

## 2013-10-12 DIAGNOSIS — F200 Paranoid schizophrenia: Secondary | ICD-10-CM

## 2013-10-12 LAB — CBG MONITORING, ED
GLUCOSE-CAPILLARY: 107 mg/dL — AB (ref 70–99)
GLUCOSE-CAPILLARY: 102 mg/dL — AB (ref 70–99)

## 2013-10-12 MED ORDER — LURASIDONE HCL 40 MG PO TABS: 40.0000 mg | ORAL_TABLET | Freq: Every day | ORAL | Status: DC

## 2013-10-12 NOTE — BH Assessment (Signed)
[place] Assessment Progress Note      Pt IVC was rescinded by Dr De Nurse. Notice of commitment change faxed to [employer].

## 2013-10-12 NOTE — BH Assessment (Signed)
[place] Assessment Progress Note     Pt has been cleared for discharge by Dr De Nurse.  This Probation officer consulted with patient and she verified that she is not having any thoughts of harming herself or others and denies any AVH or delusional thoughts.  She reports she lives next door to her uncle and he helps take care of her.  She states she recently saw a new psychiatrist, but then her liver shut down and she had to be hospitalized for a period of time.  She gave me permission to consult with her uncle to further set up services.  This Probation officer spoke with the patient's uncle, [name], who is her medical POA, but not her guardian.  He reports that she was getting services in person and via telemedicine from [facility], but several of their providers left and then they had trouble with appointments being canceled.  He also reports she had been taking Latuda, but they have run out, and then the patient started refusing all medications including her anti rejection medications.  He is willing to pick her up upon discharge, but would like her to leave with prescriptions as well as an appointment to follow up with an outpatient psychiatrist because he is concerned about their ability to actually be seen at [facility] due to recent history of canceled appointments.    This Probation officer left message with [employer] of care to inquire about setting up services.
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
## 2013-10-12 NOTE — Progress Notes (Signed)
[facility] contacted the following I/DD hosptials for inpatient treatment:  1)[facility]-no beds 2)[facility]-faxed referral 3)[facility]-no beds until after 5/19 4)[facility]-previously declined due to I/DD 5)[facility]-no programming for I/DD 6)[facility]-no beds  Wyvonnia Dusky, MHT/NS

## 2013-10-12 NOTE — [facility] Suicide Risk Assessment (Signed)
Suicide Risk Assessment  Discharge Assessment     Demographic Factors:  Living alone and Unemployed  Total Time spent with patient: 20 minutes  Psychiatric Specialty Exam:     Blood pressure 125/83, pulse 98, temperature 98.1 F (36.7 C), temperature source Oral, resp. rate 16, SpO2 100.00%.There is no weight on file to calculate BMI.  General Appearance: Casual  Eye Contact::  Good  Speech:  Normal Rate  Volume:  Normal  Mood:  Euthymic  Affect:  Congruent  Thought Process:  Coherent  Orientation:  Full (Time, Place, and Person)  Thought Content:  WDL  Suicidal Thoughts:  No  Homicidal Thoughts:  No  Memory:  Immediate;   Good Recent;   Fair Remote;   Fair  Judgement:  Good  Insight:  Fair  Psychomotor Activity:  Normal  Concentration:  Good  Recall:  [place] of Knowledge:Fair  Language: Fair  Akathisia:  No  Handed:  Right  AIMS (if indicated):     Assets:  Financial Resources/Insurance Housing Leisure Time Resilience Social Support  Sleep:       Musculoskeletal: Strength & Muscle Tone: within normal limits Gait & Station: normal Patient leans: N/A   Mental Status Per Nursing Assessment::   On Admission:   Patient had been off her medications since her liver transplant.  Medications started and stable at this time.  Current Mental Status by Physician: NA  Loss Factors: NA  Historical Factors: NA  Risk Reduction Factors:   Sense of responsibility to family, Positive social support, Positive therapeutic relationship and Positive coping skills or problem solving skills  Continued Clinical Symptoms:  Schizophrenia  Cognitive Features That Contribute To Risk:  None  Suicide Risk:  Minimal: No identifiable suicidal ideation.  Patients presenting with no risk factors but with morbid ruminations; may be classified as minimal risk based on the severity of the depressive symptoms  Discharge Diagnoses:   AXIS I:  Chronic Paranoid  Schizophrenia AXIS II:  Deferred AXIS III:   Past Medical History  Diagnosis Date  . Allergy   . Anemia   . Intellectual disability   . Constipation   . Abnormal vaginal bleeding   . Biliary cirrhosis 04/26/11  . 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 DUB (dysfunctional uterine bleeding)   . PONV (postoperative nausea and vomiting)   . Seizures     congenital epilepsy-hx. seizures  . Asthma 09-23-12    Asthma somewhat causing increase wheezing and mild congestion at present  . GERD (gastroesophageal reflux disease)   . Arthritis     right knee  . Vitamin D deficiency    AXIS IV:  problems with access to health care services AXIS V:  61-70 mild symptoms  Plan Of Care/Follow-up recommendations:  Activity:  as tolerated Diet:  low-sodium heart healthy diet  Is patient on multiple antipsychotic therapies at discharge:  No   Has Patient had three or more failed trials of antipsychotic monotherapy by history:  No  Recommended Plan for Multiple Antipsychotic Therapies: NA    Waylan Boga, [place] 10/12/2013, 12:29 PM

## 2013-10-13 ENCOUNTER — Encounter (HOSPITAL_COMMUNITY): Payer: Self-pay | Admitting: Emergency Medicine

## 2013-10-13 ENCOUNTER — Emergency Department (HOSPITAL_COMMUNITY)
Admission: EM | Admit: 2013-10-13 | Discharge: 2013-10-14 | Disposition: A | Discharge status: Home / Self Care | Payer: Medicaid Other | Attending: Emergency Medicine | Admitting: Emergency Medicine

## 2013-10-13 ENCOUNTER — Ambulatory Visit (HOSPITAL_COMMUNITY)
Admission: RE | Admit: 2013-10-13 | Discharge: 2013-10-13 | Disposition: A | Discharge status: Home / Self Care | Payer: Medicaid Other | Attending: Psychiatry | Admitting: Psychiatry

## 2013-10-13 DIAGNOSIS — E559 Vitamin D deficiency, unspecified: Secondary | ICD-10-CM | POA: Insufficient documentation

## 2013-10-13 DIAGNOSIS — IMO0002 Reserved for concepts with insufficient information to code with codable children: Secondary | ICD-10-CM | POA: Insufficient documentation

## 2013-10-13 DIAGNOSIS — K219 Gastro-esophageal reflux disease without esophagitis: Secondary | ICD-10-CM | POA: Insufficient documentation

## 2013-10-13 DIAGNOSIS — Z91040 Latex allergy status: Secondary | ICD-10-CM | POA: Insufficient documentation

## 2013-10-13 DIAGNOSIS — Z79899 Other long term (current) drug therapy: Secondary | ICD-10-CM | POA: Insufficient documentation

## 2013-10-13 DIAGNOSIS — Z9114 Patient's other noncompliance with medication regimen: Secondary | ICD-10-CM

## 2013-10-13 DIAGNOSIS — F79 Unspecified intellectual disabilities: Secondary | ICD-10-CM | POA: Insufficient documentation

## 2013-10-13 DIAGNOSIS — Z3202 Encounter for pregnancy test, result negative: Secondary | ICD-10-CM | POA: Insufficient documentation

## 2013-10-13 DIAGNOSIS — Z862 Personal history of diseases of the blood and blood-forming organs and certain disorders involving the immune mechanism: Secondary | ICD-10-CM | POA: Insufficient documentation

## 2013-10-13 DIAGNOSIS — F200 Paranoid schizophrenia: Secondary | ICD-10-CM | POA: Diagnosis present

## 2013-10-13 DIAGNOSIS — G40909 Epilepsy, unspecified, not intractable, without status epilepticus: Secondary | ICD-10-CM | POA: Insufficient documentation

## 2013-10-13 DIAGNOSIS — J45909 Unspecified asthma, uncomplicated: Secondary | ICD-10-CM | POA: Insufficient documentation

## 2013-10-13 DIAGNOSIS — Z91199 Patient's noncompliance with other medical treatment and regimen due to unspecified reason: Secondary | ICD-10-CM | POA: Insufficient documentation

## 2013-10-13 DIAGNOSIS — Z9119 Patient's noncompliance with other medical treatment and regimen: Secondary | ICD-10-CM | POA: Insufficient documentation

## 2013-10-13 DIAGNOSIS — F411 Generalized anxiety disorder: Secondary | ICD-10-CM | POA: Insufficient documentation

## 2013-10-13 DIAGNOSIS — M129 Arthropathy, unspecified: Secondary | ICD-10-CM | POA: Insufficient documentation

## 2013-10-13 DIAGNOSIS — K5900 Constipation, unspecified: Secondary | ICD-10-CM | POA: Insufficient documentation

## 2013-10-13 DIAGNOSIS — Z8742 Personal history of other diseases of the female genital tract: Secondary | ICD-10-CM | POA: Insufficient documentation

## 2013-10-13 DIAGNOSIS — Z792 Long term (current) use of antibiotics: Secondary | ICD-10-CM | POA: Insufficient documentation

## 2013-10-13 DIAGNOSIS — Z880 Allergy status to penicillin: Secondary | ICD-10-CM | POA: Insufficient documentation

## 2013-10-13 NOTE — ED Notes (Signed)
Pt was seen at [facility] and was sent here for holding due to they have no available female beds at this time
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
## 2013-10-14 ENCOUNTER — Encounter (HOSPITAL_COMMUNITY): Payer: Self-pay | Admitting: Psychiatry

## 2013-10-14 DIAGNOSIS — F200 Paranoid schizophrenia: Secondary | ICD-10-CM

## 2013-10-14 LAB — COMPREHENSIVE METABOLIC PANEL
ALT: 13 U/L (ref 0–35)
AST: 12 U/L (ref 0–37)
Albumin: 4.4 g/dL (ref 3.5–5.2)
Alkaline Phosphatase: 63 U/L (ref 39–117)
BILIRUBIN TOTAL: 0.4 mg/dL (ref 0.3–1.2)
BUN: 10 mg/dL (ref 6–23)
CHLORIDE: 106 meq/L (ref 96–112)
CO2: 21 mEq/L (ref 19–32)
CREATININE: 0.62 mg/dL (ref 0.50–1.10)
Calcium: 9.4 mg/dL (ref 8.4–10.5)
GFR calc Af Amer: >90 mL/min (ref >90)
GFR calc non Af Amer: >90 mL/min (ref >90)
Glucose, Bld: 109 mg/dL — ABNORMAL HIGH (ref 70–99)
POTASSIUM: 4.4 meq/L (ref 3.7–5.3)
Sodium: 142 mEq/L (ref 137–147)
Total Protein: 7.1 g/dL (ref 6.0–8.3)

## 2013-10-14 LAB — CBC
HCT: 28.3 % — ABNORMAL LOW (ref 36.0–46.0)
Hemoglobin: 9.5 g/dL — ABNORMAL LOW (ref 12.0–15.0)
MCH: 27.1 pg (ref 26.0–34.0)
MCHC: 33.6 g/dL (ref 30.0–36.0)
MCV: 80.6 fL (ref 78.0–100.0)
PLATELETS: 123 10*3/uL — AB (ref 150–400)
RBC: 3.51 MIL/uL — ABNORMAL LOW (ref 3.87–5.11)
RDW: 13.9 % (ref 11.5–15.5)
WBC: 5.8 10*3/uL (ref 4.0–10.5)

## 2013-10-14 LAB — RAPID URINE DRUG SCREEN, HOSP PERFORMED
AMPHETAMINES: NOT DETECTED
BENZODIAZEPINES: NOT DETECTED
Barbiturates: NOT DETECTED
Cocaine: NOT DETECTED
Opiates: NOT DETECTED
Tetrahydrocannabinol: NOT DETECTED

## 2013-10-14 LAB — ETHANOL: Alcohol, Ethyl (B): <11 mg/dL (ref 0–11)

## 2013-10-14 LAB — SALICYLATE LEVEL

## 2013-10-14 LAB — ACETAMINOPHEN LEVEL

## 2013-10-14 LAB — PREGNANCY, URINE: Preg Test, Ur: NEGATIVE

## 2013-10-14 MED ORDER — LIP MEDEX EX OINT
TOPICAL_OINTMENT | Freq: Once | CUTANEOUS | Status: AC
Start: 2013-10-14 — End: 2013-10-14
  Administered 2013-10-14: 11:00:00 via TOPICAL
  Filled 2013-10-14: qty 7

## 2013-10-14 MED ORDER — ALBUTEROL SULFATE HFA 108 (90 BASE) MCG/ACT IN AERS
2.0000 | INHALATION_SPRAY | RESPIRATORY_TRACT | Status: DC | PRN
  Filled 2013-10-14: qty 6.7

## 2013-10-14 MED ORDER — AZELASTINE HCL 0.1 % NA SOLN
1.0000 | Freq: Two times a day (BID) | NASAL | Status: DC | PRN
  Filled 2013-10-14: qty 30

## 2013-10-14 MED ORDER — MAGNESIUM OXIDE 400 (241.3 MG) MG PO TABS
400.0000 mg | ORAL_TABLET | Freq: Two times a day (BID) | ORAL | Status: DC
  Administered 2013-10-14: 400 mg via ORAL
  Filled 2013-10-14 (×2): qty 1

## 2013-10-14 MED ORDER — PREDNISONE 5 MG PO TABS
5.0000 mg | ORAL_TABLET | Freq: Every day | ORAL | Status: DC
  Administered 2013-10-14: 5 mg via ORAL
  Filled 2013-10-14: qty 1

## 2013-10-14 MED ORDER — VALGANCICLOVIR HCL 450 MG PO TABS
900.0000 mg | ORAL_TABLET | Freq: Every evening | ORAL | Status: DC
  Administered 2013-10-14: 900 mg via ORAL
  Filled 2013-10-14: qty 2

## 2013-10-14 MED ORDER — OXYCODONE HCL 5 MG PO TABS
5.0000 mg | ORAL_TABLET | ORAL | Status: DC | PRN
  Filled 2013-10-14: qty 2

## 2013-10-14 MED ORDER — ONDANSETRON HCL 4 MG PO TABS: 4.0000 mg | ORAL_TABLET | Freq: Three times a day (TID) | ORAL | Status: DC | PRN

## 2013-10-14 MED ORDER — MONTELUKAST SODIUM 10 MG PO TABS
10.0000 mg | ORAL_TABLET | Freq: Every day | ORAL | Status: DC
  Administered 2013-10-14: 10 mg via ORAL
  Filled 2013-10-14: qty 1

## 2013-10-14 MED ORDER — MYCOPHENOLATE MOFETIL 250 MG PO CAPS
1000.0000 mg | ORAL_CAPSULE | Freq: Two times a day (BID) | ORAL | Status: DC
  Administered 2013-10-14: 1000 mg via ORAL
  Filled 2013-10-14 (×2): qty 4

## 2013-10-14 MED ORDER — ZOLPIDEM TARTRATE 5 MG PO TABS: 5.0000 mg | ORAL_TABLET | Freq: Every evening | ORAL | Status: DC | PRN

## 2013-10-14 MED ORDER — BUDESONIDE-FORMOTEROL FUMARATE 160-4.5 MCG/ACT IN AERO
1.0000 | INHALATION_SPRAY | Freq: Two times a day (BID) | RESPIRATORY_TRACT | Status: DC
  Administered 2013-10-14: 1 via RESPIRATORY_TRACT
  Filled 2013-10-14: qty 6

## 2013-10-14 MED ORDER — POLYETHYLENE GLYCOL 3350 17 G PO PACK
17.0000 g | PACK | Freq: Every day | ORAL | Status: DC | PRN
  Filled 2013-10-14: qty 1

## 2013-10-14 MED ORDER — TACROLIMUS 1 MG PO CAPS
4.0000 mg | ORAL_CAPSULE | Freq: Every day | ORAL | Status: DC
  Administered 2013-10-14: 4 mg via ORAL
  Filled 2013-10-14: qty 4

## 2013-10-14 MED ORDER — CALCIUM CARBONATE-VITAMIN D 500-200 MG-UNIT PO TABS
1.0000 | ORAL_TABLET | Freq: Two times a day (BID) | ORAL | Status: DC
  Administered 2013-10-14: 1 via ORAL
  Filled 2013-10-14 (×2): qty 1

## 2013-10-14 MED ORDER — NICOTINE 21 MG/24HR TD PT24: 21.0000 mg | MEDICATED_PATCH | Freq: Every day | TRANSDERMAL | Status: DC

## 2013-10-14 MED ORDER — TACROLIMUS 1 MG PO CAPS
3.0000 mg | ORAL_CAPSULE | Freq: Every day | ORAL | Status: DC
  Filled 2013-10-14: qty 3

## 2013-10-14 MED ORDER — SENNOSIDES-DOCUSATE SODIUM 8.6-50 MG PO TABS
2.0000 | ORAL_TABLET | Freq: Two times a day (BID) | ORAL | Status: DC
  Administered 2013-10-14: 2 via ORAL
  Filled 2013-10-14: qty 2

## 2013-10-14 MED ORDER — ALBUTEROL SULFATE (2.5 MG/3ML) 0.083% IN NEBU: 2.5000 mg | INHALATION_SOLUTION | RESPIRATORY_TRACT | Status: DC | PRN

## 2013-10-14 MED ORDER — ALUM & MAG HYDROXIDE-SIMETH 200-200-20 MG/5ML PO SUSP
30.0000 mL | ORAL | Status: DC | PRN
  Administered 2013-10-14: 30 mL via ORAL
  Filled 2013-10-14: qty 30

## 2013-10-14 MED ORDER — ADULT MULTIVITAMIN W/MINERALS CH
1.0000 | ORAL_TABLET | Freq: Every day | ORAL | Status: DC
  Administered 2013-10-14: 1 via ORAL
  Filled 2013-10-14: qty 1

## 2013-10-14 MED ORDER — LEVETIRACETAM 500 MG PO TABS
1000.0000 mg | ORAL_TABLET | Freq: Two times a day (BID) | ORAL | Status: DC
  Administered 2013-10-14: 1000 mg via ORAL
  Filled 2013-10-14 (×2): qty 2

## 2013-10-14 MED ORDER — PANTOPRAZOLE SODIUM 40 MG PO TBEC
40.0000 mg | DELAYED_RELEASE_TABLET | Freq: Every day | ORAL | Status: DC
  Administered 2013-10-14: 40 mg via ORAL
  Filled 2013-10-14: qty 1

## 2013-10-14 MED ORDER — ZIPRASIDONE MESYLATE 20 MG IM SOLR: 10.0000 mg | Freq: Once | INTRAMUSCULAR | Status: DC | PRN

## 2013-10-14 MED ORDER — LORAZEPAM 1 MG PO TABS
1.0000 mg | ORAL_TABLET | Freq: Three times a day (TID) | ORAL | Status: DC | PRN
  Administered 2013-10-14: 1 mg via ORAL
  Filled 2013-10-14: qty 1

## 2013-10-14 MED ORDER — POLYVINYL ALCOHOL 1.4 % OP SOLN
1.0000 [drp] | Freq: Four times a day (QID) | OPHTHALMIC | Status: DC | PRN
  Filled 2013-10-14: qty 15

## 2013-10-14 NOTE — ED Notes (Addendum)
Pt sleeping comfortably, rise and fall of chest observed, breathing easily and unlabored. Pt is anxious and in pain when awake, will defer vitals until closer to 0700. Will also attempt to collect urine specimen at that time.

## 2013-10-14 NOTE — ED Provider Notes (Signed)
  Physical Exam  BP 102/64  Pulse 95  Temp(Src) 98.6 F (37 C) (Oral)  Resp 16  SpO2 100%  Physical Exam  ED Course  Procedures  Patient seen by psychiatry for hallucinations. Cleared by psych. Will d/c home with uncle.     Wandra Arthurs, MD 10/14/13 [phone]

## 2013-10-14 NOTE — ED Notes (Addendum)
Pt still tearful and shaking. States she's still in a lot of pain. This Probation officer sat with patient for 30 minutes and calmed patient down. She was given a meal (milk and [place] sandwich). Patient finally calmed enough and is resting. When I tried to dim the lights, she told me that she is afraid of the dark. Patient is scared to be alone. Writer informed her that I am right outside the door and that if she needs me to let me know. Patient seemed reassured.

## 2013-10-14 NOTE — ED Notes (Signed)
Previous tech told this tech that patient refused to go to the restroom. This tech will ask patient again around 800

## 2013-10-14 NOTE — Discharge Instructions (Signed)
Please take your medicines as prescribed.  Follow up with your doctor.   Return to ER if you have thoughts of harming yourself or others, worse hallucinations.

## 2013-10-14 NOTE — ED Notes (Signed)
Assumed care of patient Patient has been discharged from ED and is awaiting arrival of her uncle who will be taking her home Patient in NAD and ambulated to bathroom with assistance from ED tech

## 2013-10-14 NOTE — ED Notes (Signed)
RRT DEE at [facility]

## 2013-10-14 NOTE — ED Notes (Signed)
[name], [name]  [phone]

## 2013-10-14 NOTE — ED Notes (Signed)
MD at bedside.  Psych MD at bs
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
## 2013-10-14 NOTE — BH Assessment (Signed)
Pt referred to [facility], [place], and [place].  Pt declined at [place], [place], [place] Assessment Counselor

## 2013-10-14 NOTE — Consult Note (Signed)
[name]   Reason for Woodward:  Schizophrenia Referring Physician:  EDP  [name] is an 38 y.o. female. Total Time spent with patient: 20 minutes  Assessment: AXIS I:  Chronic Paranoid Schizophrenia AXIS II:  Deferred AXIS III:   Past Medical History  Diagnosis Date  . Allergy   . Anemia   . Intellectual disability   . Constipation   . Abnormal vaginal bleeding   . Biliary cirrhosis 04/26/11  . 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 DUB (dysfunctional uterine bleeding)   . PONV (postoperative nausea and vomiting)   . Seizures     congenital epilepsy-hx. seizures  . Asthma 09-23-12    Asthma somewhat causing increase wheezing and mild congestion at present  . GERD (gastroesophageal reflux disease)   . Arthritis     right knee  . Vitamin D deficiency    AXIS IV:  other psychosocial or environmental problems, problems related to social environment and problems with primary support group AXIS V:  61-70 mild symptoms  Plan:  No evidence of imminent risk to self or others at present.  Discharge and follow-up with [facility], Dr. Lovena Le assessed the patient and concurs with the plan.  Subjective:   [name] is a 38 y.o. female patient does not warrant admission.  HPI:   HPI Elements:   Location:  generalized. Quality:  chronic. Severity:  mild. Timing:  intermittent. Duration:  months. Context:  stressors.  Past Psychiatric History: Past Medical History  Diagnosis Date  . Allergy   . Anemia   . Intellectual disability   . Constipation   . Abnormal vaginal bleeding   . Biliary cirrhosis 04/26/11  . 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 DUB (dysfunctional uterine bleeding)   . PONV (postoperative nausea and vomiting)   . Seizures     congenital epilepsy-hx. seizures  . Asthma 09-23-12    Asthma somewhat causing increase wheezing and mild congestion at present  . GERD (gastroesophageal reflux disease)    . Arthritis     right knee  . Vitamin D deficiency     reports that she has never smoked. She has never used smokeless tobacco. She reports that she does not drink alcohol or use illicit drugs. Family History  Problem Relation Age of Onset  . Diabetes Mother   . Heart disease Mother   . COPD Mother            Allergies:   Allergies  Allergen Reactions  . Tramadol Hcl Itching and Nausea And Vomiting    Causes SEIZURES  . Aspirin Nausea And Vomiting    Due to Stomach ulcer  . Penicillins Itching, Swelling and Rash       . Pseudoephedrine Itching and Swelling    Tongue swelling  . Sulfa Antibiotics Nausea And Vomiting  . Latex Itching and Rash    ACT Assessment Complete:  Yes:    Educational Status    Risk to Self: Risk to self Is patient at risk for suicide?: No, but patient needs Medical Clearance Substance abuse history and/or treatment for substance abuse?: No  Risk to Others:    Abuse:    Prior Inpatient Therapy:    Prior Outpatient Therapy:    Additional Information:                    Objective: Blood pressure 102/63, pulse 104, temperature 98.3 F (36.8 C), temperature source Oral, resp. rate  14, SpO2 100.00%.There is no weight on file to calculate BMI. Results for orders placed during the hospital encounter of 10/13/13 (from the past 72 hour(s))  CBC     Status: Abnormal   Collection Time    10/14/13 12:06 AM      Result Value Ref Range   WBC 5.8  4.0 - 10.5 K/uL   RBC 3.51 (*) 3.87 - 5.11 MIL/uL   Hemoglobin 9.5 (*) 12.0 - 15.0 g/dL   HCT 28.3 (*) 36.0 - 46.0 %   MCV 80.6  78.0 - 100.0 fL   MCH 27.1  26.0 - 34.0 pg   MCHC 33.6  30.0 - 36.0 g/dL   RDW 13.9  11.5 - 15.5 %   Platelets 123 (*) 150 - 400 K/uL   Comment: REPEATED TO VERIFY  COMPREHENSIVE METABOLIC PANEL     Status: Abnormal   Collection Time    10/14/13 12:06 AM      Result Value Ref Range   Sodium 142  137 - 147 mEq/L   Potassium 4.4  3.7 - 5.3 mEq/L   Chloride 106   96 - 112 mEq/L   CO2 21  19 - 32 mEq/L   Glucose, Bld 109 (*) 70 - 99 mg/dL   BUN 10  6 - 23 mg/dL   Creatinine, Ser 0.62  0.50 - 1.10 mg/dL   Calcium 9.4  8.4 - 10.5 mg/dL   Total Protein 7.1  6.0 - 8.3 g/dL   Albumin 4.4  3.5 - 5.2 g/dL   AST 12  0 - 37 U/L   ALT 13  0 - 35 U/L   Alkaline Phosphatase 63  39 - 117 U/L   Total Bilirubin 0.4  0.3 - 1.2 mg/dL   GFR calc non Af Amer >90  >90 mL/min   GFR calc Af Amer >90  >90 mL/min   Comment: (NOTE)     The eGFR has been calculated using the CKD EPI equation.     This calculation has not been validated in all clinical situations.     eGFR's persistently <90 mL/min signify possible Chronic Kidney     Disease.  ETHANOL     Status: None   Collection Time    10/14/13 12:06 AM      Result Value Ref Range   Alcohol, Ethyl (B) <11  0 - 11 mg/dL   Comment:            LOWEST DETECTABLE LIMIT FOR     SERUM ALCOHOL IS 11 mg/dL     FOR MEDICAL PURPOSES ONLY  SALICYLATE LEVEL     Status: Abnormal   Collection Time    10/14/13 12:06 AM      Result Value Ref Range   Salicylate Lvl <2.5 (*) 2.8 - 20.0 mg/dL  ACETAMINOPHEN LEVEL     Status: None   Collection Time    10/14/13 12:06 AM      Result Value Ref Range   Acetaminophen (Tylenol), Serum <15.0  10 - 30 ug/mL   Comment:            THERAPEUTIC CONCENTRATIONS VARY     SIGNIFICANTLY. A RANGE OF 10-30     ug/mL MAY BE AN EFFECTIVE     CONCENTRATION FOR MANY PATIENTS.     HOWEVER, SOME ARE BEST TREATED     AT CONCENTRATIONS OUTSIDE THIS     RANGE.     ACETAMINOPHEN CONCENTRATIONS     >150 ug/mL AT 4  HOURS AFTER     INGESTION AND >50 ug/mL AT 12     HOURS AFTER INGESTION ARE     OFTEN ASSOCIATED WITH TOXIC     REACTIONS.  URINE RAPID DRUG SCREEN (HOSP PERFORMED)     Status: None   Collection Time    10/14/13  8:59 AM      Result Value Ref Range   Opiates NONE DETECTED  NONE DETECTED   Cocaine NONE DETECTED  NONE DETECTED   Benzodiazepines NONE DETECTED  NONE DETECTED    Amphetamines NONE DETECTED  NONE DETECTED   Tetrahydrocannabinol NONE DETECTED  NONE DETECTED   Barbiturates NONE DETECTED  NONE DETECTED   Comment:            DRUG SCREEN FOR MEDICAL PURPOSES     ONLY.  IF CONFIRMATION IS NEEDED     FOR ANY PURPOSE, NOTIFY LAB     WITHIN 5 DAYS.                LOWEST DETECTABLE LIMITS     FOR URINE DRUG SCREEN     Drug Class       Cutoff (ng/mL)     Amphetamine      1000     Barbiturate      200     Benzodiazepine   175     Tricyclics       102     Opiates          300     Cocaine          300     THC              50  PREGNANCY, URINE     Status: None   Collection Time    10/14/13  8:59 AM      Result Value Ref Range   Preg Test, Ur NEGATIVE  NEGATIVE   Comment:            THE SENSITIVITY OF THIS     METHODOLOGY IS >20 mIU/mL.   Labs are reviewed and are pertinent for medical issues noted.  Current Facility-Administered Medications  Medication Dose Route Frequency Provider Last Rate Last Dose  . albuterol (PROVENTIL) (2.5 MG/3ML) 0.083% nebulizer solution 2.5 mg  2.5 mg Inhalation Q4H PRN Kalman Drape, MD      . alum & mag hydroxide-simeth (MAALOX/MYLANTA) 200-200-20 MG/5ML suspension 30 mL  30 mL Oral PRN Antonietta Breach, PA-C   30 mL at 10/14/13 0221  . azelastine (ASTELIN) 0.1 % nasal spray 1 spray  1 spray Each Nare BID PRN Kalman Drape, MD      . budesonide-formoterol [facility]) 160-4.5 MCG/ACT inhaler 1 puff  1 puff Inhalation BID Kalman Drape, MD   1 puff at 10/14/13 0907  . calcium-vitamin D (OSCAL WITH D) 500-200 MG-UNIT per tablet 1 tablet  1 tablet Oral BID Kalman Drape, MD   1 tablet at 10/14/13 1104  . levETIRAcetam (KEPPRA) tablet 1,000 mg  1,000 mg Oral BID Kalman Drape, MD   1,000 mg at 10/14/13 1106  . LORazepam (ATIVAN) tablet 1 mg  1 mg Oral Q8H PRN Antonietta Breach, PA-C   1 mg at 10/14/13 0221  . magnesium oxide (MAG-OX) tablet 400 mg  400 mg Oral BID Kalman Drape, MD   400 mg at 10/14/13 1102  . montelukast (SINGULAIR) tablet 10  mg  10 mg Oral Daily Kalman Drape, MD  10 mg at 10/14/13 1102  . multivitamin with minerals tablet 1 tablet  1 tablet Oral Daily Kalman Drape, MD   1 tablet at 10/14/13 1112  . mycophenolate (CELLCEPT) capsule 1,000 mg  1,000 mg Oral BID Kalman Drape, MD   1,000 mg at 10/14/13 1101  . nicotine (NICODERM CQ - dosed in mg/24 hours) patch 21 mg  21 mg Transdermal Daily Antonietta Breach, PA-C      . ondansetron [facility]) tablet 4 mg  4 mg Oral Q8H PRN Antonietta Breach, PA-C      . oxyCODONE (Oxy IR/ROXICODONE) immediate release tablet 5-10 mg  5-10 mg Oral Q4H PRN Kalman Drape, MD      . pantoprazole (PROTONIX) EC tablet 40 mg  40 mg Oral Daily Kalman Drape, MD   40 mg at 10/14/13 1112  . polyethylene glycol (MIRALAX / GLYCOLAX) packet 17 g  17 g Oral Daily PRN Kalman Drape, MD      . polyvinyl alcohol (LIQUIFILM TEARS) 1.4 % ophthalmic solution 1 drop  1 drop Both Eyes QID PRN Kalman Drape, MD      . predniSONE (DELTASONE) tablet 5 mg  5 mg Oral Daily Kalman Drape, MD   5 mg at 10/14/13 1105  . senna-docusate (Senokot-S) tablet 2 tablet  2 tablet Oral BID Kalman Drape, MD   2 tablet at 10/14/13 1112  . tacrolimus (PROGRAF) capsule 3 mg  3 mg Oral QHS Kalman Drape, MD      . tacrolimus (PROGRAF) capsule 4 mg  4 mg Oral Daily Kalman Drape, MD   4 mg at 10/14/13 1103  . valGANciclovir (VALCYTE) 450 MG tablet TABS 900 mg  900 mg Oral QPM Kalman Drape, MD      . ziprasidone (GEODON) injection 10 mg  10 mg Intramuscular Once PRN Antonietta Breach, PA-C      . zolpidem (AMBIEN) tablet 5 mg  5 mg Oral QHS PRN Antonietta Breach, PA-C       Current Outpatient Prescriptions  Medication Sig Dispense Refill  . albuterol (PROVENTIL,VENTOLIN) 90 MCG/ACT inhaler Inhale 2 puffs into the lungs every 4 (four) hours as needed for wheezing.       . Azelastine HCl (ASTEPRO) 0.15 % SOLN Place 1 spray into the nose 2 (two) times daily as needed (allergic rhinitis).       . budesonide-formoterol (SYMBICORT) 160-4.5 MCG/ACT inhaler Inhale 1 puff  into the lungs 2 (two) times daily.   1 Inhaler    . calcium-vitamin D (CALCIUM + D) 250-125 MG-UNIT per tablet Take 2 tablets by mouth 2 (two) times daily. 1 pm & 5 pm.      . carboxymethylcellulose (REFRESH TEARS) 0.5 % SOLN Place 1 drop into both eyes 4 (four) times daily as needed (for dry eyes).      Marland Kitchen levETIRAcetam (KEPPRA) 500 MG tablet Take 2 tablets (1,000 mg total) by mouth 2 (two) times daily.  120 tablet  11  . magnesium oxide (MAG-OX) 400 MG tablet Take 400 mg by mouth 2 (two) times daily. 1 pm & 5 pm.      . montelukast (SINGULAIR) 10 MG tablet Take 10 mg by mouth daily. 9 am.  30 tablet  12  . Multiple Vitamin (MULTIVITAMIN) tablet Take 1 tablet by mouth daily. 9 am.      . mycophenolate (CELLCEPT) 250 MG capsule Take 1,000 mg by mouth 2 (two) times daily. 9 am & 9 pm.      .  omeprazole (PRILOSEC) 40 MG capsule Take 40 mg by mouth daily. 9 am.      . oxycodone (OXY-IR) 5 MG capsule Take 5-10 mg by mouth every 4 (four) hours as needed for pain.      . polyethylene glycol (MIRALAX / GLYCOLAX) packet Take 17 g by mouth daily as needed (for constipation). In water or juice      . predniSONE (DELTASONE) 5 MG tablet Take 5 mg by mouth daily. At 9am.      . senna-docusate (SENOKOT-S) 8.6-50 MG per tablet Take 2 tablets by mouth 2 (two) times daily.       . tacrolimus (PROGRAF) 1 MG capsule Take 3-4 mg by mouth 2 (two) times daily. 4 mg every morning (9 am) and 3 mg (9 pm) every evening.      . valGANciclovir (VALCYTE) 450 MG tablet Take 2 tablets (900 mg total) by mouth every evening. At 1700        Psychiatric Specialty Exam:     Blood pressure 102/63, pulse 104, temperature 98.3 F (36.8 C), temperature source Oral, resp. rate 14, SpO2 100.00%.There is no weight on file to calculate BMI.  General Appearance: Casual  Eye Contact::  Good  Speech:  Normal Rate  Volume:  Normal  Mood:  Anxious  Affect:  Blunt  Thought Process:  Irrelevant  Orientation:  Full (Time, Place, and  Person)  Thought Content:  WDL  Suicidal Thoughts:  No  Homicidal Thoughts:  No  Memory:  Immediate;   Fair Recent;   Fair Remote;   Fair  Judgement:  Fair  Insight:  Fair  Psychomotor Activity:  Normal  Concentration:  Good  Recall:  [employer] of Knowledge:Fair  Language: Fair  Akathisia:  No  Handed:  Right  AIMS (if indicated):     Assets:  Financial Resources/Insurance Housing Leisure Time Resilience Social Support  Sleep:      Musculoskeletal: Strength & Muscle Tone: within normal limits Gait & Station: normal Patient leans: N/A  Treatment Plan Summary: Discharge home to follow-up with [employer] [facility] along with [facility].  Waylan Boga, PMH-NP 10/14/2013 2:37 PM

## 2013-10-14 NOTE — ED Notes (Signed)
Light pink button down shirt,  Light pink cotton panties,  Gray pull on pants,  Elastic waistband.  White bra  ,  Pt has her eye glasses in her right hand. Pt has one pair gray/purple tennis shoes

## 2013-10-14 NOTE — ED Notes (Signed)
This tech along with other staff attempted to get patient to use bed pan. Patient was unable to go. This tech asked Dr Venora Maples if we could in and out patient to get urine sample. Dr agreed. This tech along with 4 other staff members attempted to obtain urine sample with no success. Dr Venora Maples notified.

## 2013-10-14 NOTE — ED Provider Notes (Signed)
Medical screening examination/treatment/procedure(s) were performed by non-physician practitioner and as supervising physician I was immediately available for consultation/collaboration.   EKG Interpretation None       Kalman Drape, MD 10/14/13 [phone]

## 2013-10-14 NOTE — ED Notes (Signed)
Patient's uncle now present to take patient home Discharge instructions reviewed with uncle, who v/u Patient in NAD upon time of DC home

## 2013-10-14 NOTE — Progress Notes (Signed)
CSW informed the nursing staff of the updated disposition and provided outpatient provider information to be added to discharge paperwork.  CSW spoke with the patient's uncle Sonia Side to explain the discharge plans and he is on his way to pick up the patient.     Chesley Noon, MSW, [place], 10/14/2013 Evening Clinical Social Worker [phone]

## 2013-10-14 NOTE — Progress Notes (Signed)
CSW completed the over the phone referral with Dawn at [place] for waitlist.    Chesley Noon, MSW, [place], 10/14/2013 Evening Clinical Social Worker [phone]

## 2013-10-14 NOTE — ED Notes (Signed)
Pt is now IVC,  Papers signed by family and brought by [employer],  Papers for refusing to take medications for liver and hallucination,

## 2013-10-14 NOTE — BH Assessment (Signed)
Pt will be D/C from [place]. Placement is not needed at this time.   Shaune Pollack, MS, [place] Assessment Counselor

## 2013-10-14 NOTE — BHH Suicide Risk Assessment (Signed)
Suicide Risk Assessment  Discharge Assessment     Demographic Factors:  Living alone  Total Time spent with patient: 20 minutes  Psychiatric Specialty Exam:      Blood pressure 102/63, pulse 104, temperature 98.3 F (36.8 C), temperature source Oral, resp. rate 14, SpO2 100.00%.There is no weight on file to calculate BMI.   General Appearance: Casual   Eye Contact:: Good   Speech: Normal Rate   Volume: Normal   Mood: Anxious   Affect: Blunt   Thought Process: Irrelevant   Orientation: Full (Time, Place, and Person)   Thought Content: WDL   Suicidal Thoughts: No   Homicidal Thoughts: No   Memory: Immediate; Fair  Recent; Fair  Remote; Fair   Judgement: Fair   Insight: Fair   Psychomotor Activity: Normal   Concentration: Good   Recall: [employer] of Knowledge:Fair   Language: Fair   Akathisia: No   Handed: Right   AIMS (if indicated):   Assets: Catering manager  Housing  Leisure Time  Resilience  Social Support   Sleep:   Musculoskeletal:  Strength & Muscle Tone: within normal limits  Gait & Station: normal  Patient leans: N/A  Mental Status Per Nursing Assessment::   On Admission:   Patient's family thought she was hallucinating and sent her to the ED for evaluation.  Current Mental Status by Physician: NA  Loss Factors: NA  Historical Factors: NA  Risk Reduction Factors:   Sense of responsibility to family, Positive social support, Positive therapeutic relationship, Positive coping skills or problem solving skills and family lives beside her and helps care for her  Continued Clinical Symptoms:  Chronic Paranoid Schizophrenia  Cognitive Features That Contribute To Risk:  None  Suicide Risk:  Minimal: No identifiable suicidal ideation.  Patients presenting with no risk factors but with morbid ruminations; may be classified as minimal risk based on the severity of the depressive symptoms  Discharge Diagnoses:   AXIS I:  Chronic  Paranoid Schizophrenia AXIS II:  Deferred AXIS III:   Past Medical History  Diagnosis Date  . Allergy   . Anemia   . Intellectual disability   . Constipation   . Abnormal vaginal bleeding   . Biliary cirrhosis 04/26/11  . 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 DUB (dysfunctional uterine bleeding)   . PONV (postoperative nausea and vomiting)   . Seizures     congenital epilepsy-hx. seizures  . Asthma 09-23-12    Asthma somewhat causing increase wheezing and mild congestion at present  . GERD (gastroesophageal reflux disease)   . Arthritis     right knee  . Vitamin D deficiency    AXIS IV:  other psychosocial or environmental problems, problems related to social environment and problems with primary support group AXIS V:  61-70 mild symptoms  Plan Of Care/Follow-up recommendations:  Activity:  as tolerated Diet:  low-sodium heart healthy diet  Is patient on multiple antipsychotic therapies at discharge:  No   Has Patient had three or more failed trials of antipsychotic monotherapy by history:  No  Recommended Plan for Multiple Antipsychotic Therapies: NA    Waylan Boga, PMH-NP 10/14/2013, 2:58 PM

## 2013-10-14 NOTE — BH Assessment (Signed)
Clinician consulted with Lonzo Cloud who states Pt meets criteria for inpatient admission due to deteriorating mental health. Pt is not appropriate for bed at [facility] due to MR diagnosis.  Pt refused to go to [facility] for medical clearance. Frederico Hamman, [place] consulted case with Dr. Darleene Cleaver in regards to possible need for pt to be IVC'd. Frederico Hamman also contacted Dr. Mingo Amber at [facility] to inform of pt's arrival and discuss need for IVC. Clinician and Frederico Hamman discussed possible options for family to [facility] pt [facility] and allow [facility] to transport. Pt's uncle decided to transport pt himself. Pt was transferred to [facility] by her aunt and uncle for medical clearance until an appropriate bed is found.  Fredonia Highland, MSW, LCSW Triage Specialist [phone]

## 2013-10-15 ENCOUNTER — Emergency Department (HOSPITAL_COMMUNITY)
Admission: EM | Admit: 2013-10-15 | Discharge: 2013-10-16 | Disposition: A | Discharge status: Home / Self Care | Payer: Medicaid Other | Attending: Emergency Medicine | Admitting: Emergency Medicine

## 2013-10-15 ENCOUNTER — Encounter (HOSPITAL_COMMUNITY): Payer: Self-pay | Admitting: Emergency Medicine

## 2013-10-15 DIAGNOSIS — J45909 Unspecified asthma, uncomplicated: Secondary | ICD-10-CM | POA: Insufficient documentation

## 2013-10-15 DIAGNOSIS — M1710 Unilateral primary osteoarthritis, unspecified knee: Secondary | ICD-10-CM | POA: Insufficient documentation

## 2013-10-15 DIAGNOSIS — IMO0002 Reserved for concepts with insufficient information to code with codable children: Secondary | ICD-10-CM | POA: Insufficient documentation

## 2013-10-15 DIAGNOSIS — F200 Paranoid schizophrenia: Secondary | ICD-10-CM

## 2013-10-15 DIAGNOSIS — G40909 Epilepsy, unspecified, not intractable, without status epilepticus: Secondary | ICD-10-CM | POA: Insufficient documentation

## 2013-10-15 DIAGNOSIS — R443 Hallucinations, unspecified: Secondary | ICD-10-CM

## 2013-10-15 DIAGNOSIS — Z79899 Other long term (current) drug therapy: Secondary | ICD-10-CM | POA: Insufficient documentation

## 2013-10-15 DIAGNOSIS — R109 Unspecified abdominal pain: Secondary | ICD-10-CM

## 2013-10-15 DIAGNOSIS — R350 Frequency of micturition: Secondary | ICD-10-CM | POA: Insufficient documentation

## 2013-10-15 DIAGNOSIS — E559 Vitamin D deficiency, unspecified: Secondary | ICD-10-CM | POA: Insufficient documentation

## 2013-10-15 DIAGNOSIS — Z91040 Latex allergy status: Secondary | ICD-10-CM | POA: Insufficient documentation

## 2013-10-15 DIAGNOSIS — Z3202 Encounter for pregnancy test, result negative: Secondary | ICD-10-CM | POA: Insufficient documentation

## 2013-10-15 DIAGNOSIS — K219 Gastro-esophageal reflux disease without esophagitis: Secondary | ICD-10-CM | POA: Insufficient documentation

## 2013-10-15 DIAGNOSIS — N898 Other specified noninflammatory disorders of vagina: Secondary | ICD-10-CM | POA: Insufficient documentation

## 2013-10-15 DIAGNOSIS — Z880 Allergy status to penicillin: Secondary | ICD-10-CM | POA: Insufficient documentation

## 2013-10-15 DIAGNOSIS — K5900 Constipation, unspecified: Secondary | ICD-10-CM | POA: Insufficient documentation

## 2013-10-15 DIAGNOSIS — Z9089 Acquired absence of other organs: Secondary | ICD-10-CM | POA: Insufficient documentation

## 2013-10-15 LAB — COMPREHENSIVE METABOLIC PANEL
ALBUMIN: 4.1 g/dL (ref 3.5–5.2)
ALT: 11 U/L (ref 0–35)
AST: 11 U/L (ref 0–37)
Alkaline Phosphatase: 66 U/L (ref 39–117)
BUN: 12 mg/dL (ref 6–23)
CO2: 25 mEq/L (ref 19–32)
Calcium: 8.7 mg/dL (ref 8.4–10.5)
Chloride: 109 mEq/L (ref 96–112)
Creatinine, Ser: 0.63 mg/dL (ref 0.50–1.10)
GFR calc Af Amer: >90 mL/min (ref >90)
GFR calc non Af Amer: >90 mL/min (ref >90)
Glucose, Bld: 116 mg/dL — ABNORMAL HIGH (ref 70–99)
Potassium: 4.5 mEq/L (ref 3.7–5.3)
SODIUM: 143 meq/L (ref 137–147)
TOTAL PROTEIN: 6.8 g/dL (ref 6.0–8.3)
Total Bilirubin: 0.3 mg/dL (ref 0.3–1.2)

## 2013-10-15 LAB — WET PREP, GENITAL
Clue Cells Wet Prep HPF POC: NONE SEEN
Trich, Wet Prep: NONE SEEN
YEAST WET PREP: NONE SEEN

## 2013-10-15 LAB — CBC WITH DIFFERENTIAL/PLATELET
BASOS PCT: 1 % (ref 0–1)
Basophils Absolute: 0 10*3/uL (ref 0.0–0.1)
EOS ABS: 0 10*3/uL (ref 0.0–0.7)
EOS PCT: 1 % (ref 0–5)
HCT: 27.4 % — ABNORMAL LOW (ref 36.0–46.0)
Hemoglobin: 9 g/dL — ABNORMAL LOW (ref 12.0–15.0)
Lymphocytes Relative: 25 % (ref 12–46)
Lymphs Abs: 1.2 10*3/uL (ref 0.7–4.0)
MCH: 26.9 pg (ref 26.0–34.0)
MCHC: 32.8 g/dL (ref 30.0–36.0)
MCV: 81.8 fL (ref 78.0–100.0)
Monocytes Absolute: 0.4 10*3/uL (ref 0.1–1.0)
Monocytes Relative: 9 % (ref 3–12)
NEUTROS PCT: 65 % (ref 43–77)
Neutro Abs: 3 10*3/uL (ref 1.7–7.7)
Platelets: 110 10*3/uL — ABNORMAL LOW (ref 150–400)
RBC: 3.35 MIL/uL — ABNORMAL LOW (ref 3.87–5.11)
RDW: 14.3 % (ref 11.5–15.5)
WBC: 4.6 10*3/uL (ref 4.0–10.5)

## 2013-10-15 LAB — URINALYSIS, ROUTINE W REFLEX MICROSCOPIC
Bilirubin Urine: NEGATIVE
Glucose, UA: NEGATIVE mg/dL
HGB URINE DIPSTICK: NEGATIVE
Ketones, ur: NEGATIVE mg/dL
Leukocytes, UA: NEGATIVE
NITRITE: NEGATIVE
Protein, ur: NEGATIVE mg/dL
SPECIFIC GRAVITY, URINE: 1.013 (ref 1.005–1.030)
Urobilinogen, UA: 0.2 mg/dL (ref 0.0–1.0)
pH: 6.5 (ref 5.0–8.0)

## 2013-10-15 LAB — POC URINE PREG, ED: PREG TEST UR: NEGATIVE

## 2013-10-15 LAB — HIV ANTIBODY (ROUTINE TESTING W REFLEX): HIV 1&2 Ab, 4th Generation: NONREACTIVE

## 2013-10-15 LAB — LIPASE, BLOOD: Lipase: 31 U/L (ref 11–59)

## 2013-10-15 MED ORDER — OXYCODONE HCL 5 MG PO CAPS: 5.0000 mg | ORAL_CAPSULE | ORAL | Status: DC | PRN

## 2013-10-15 MED ORDER — TACROLIMUS 1 MG PO CAPS: 3.0000 mg | ORAL_CAPSULE | Freq: Two times a day (BID) | ORAL | Status: DC

## 2013-10-15 MED ORDER — MAGNESIUM OXIDE 400 MG PO TABS: 400.0000 mg | ORAL_TABLET | Freq: Two times a day (BID) | ORAL | Status: DC

## 2013-10-15 MED ORDER — TACROLIMUS 1 MG PO CAPS
3.0000 mg | ORAL_CAPSULE | ORAL | Status: DC
  Administered 2013-10-15: 3 mg via ORAL
  Filled 2013-10-15 (×2): qty 3

## 2013-10-15 MED ORDER — PANTOPRAZOLE SODIUM 40 MG PO TBEC
40.0000 mg | DELAYED_RELEASE_TABLET | Freq: Every day | ORAL | Status: DC
  Administered 2013-10-16: 40 mg via ORAL
  Filled 2013-10-15 (×2): qty 1

## 2013-10-15 MED ORDER — LEVETIRACETAM 500 MG PO TABS
1000.0000 mg | ORAL_TABLET | Freq: Once | ORAL | Status: DC
  Filled 2013-10-15: qty 2

## 2013-10-15 MED ORDER — TACROLIMUS 1 MG PO CAPS
4.0000 mg | ORAL_CAPSULE | ORAL | Status: DC
Start: 2013-10-16 — End: 2013-10-16
  Administered 2013-10-16: 4 mg via ORAL
  Filled 2013-10-15 (×2): qty 4

## 2013-10-15 MED ORDER — MONTELUKAST SODIUM 10 MG PO TABS
10.0000 mg | ORAL_TABLET | Freq: Every day | ORAL | Status: DC
  Administered 2013-10-16: 10 mg via ORAL
  Filled 2013-10-15 (×2): qty 1

## 2013-10-15 MED ORDER — LORAZEPAM 1 MG PO TABS
1.0000 mg | ORAL_TABLET | Freq: Once | ORAL | Status: AC
  Administered 2013-10-16: 1 mg via ORAL
  Filled 2013-10-15: qty 1

## 2013-10-15 MED ORDER — BUDESONIDE-FORMOTEROL FUMARATE 160-4.5 MCG/ACT IN AERO
1.0000 | INHALATION_SPRAY | Freq: Two times a day (BID) | RESPIRATORY_TRACT | Status: DC
  Administered 2013-10-15 – 2013-10-16 (×2): 1 via RESPIRATORY_TRACT
  Filled 2013-10-15: qty 6

## 2013-10-15 MED ORDER — CARBOXYMETHYLCELLULOSE SODIUM 0.5 % OP SOLN: 1.0000 [drp] | Freq: Four times a day (QID) | OPHTHALMIC | Status: DC | PRN

## 2013-10-15 MED ORDER — MYCOPHENOLATE MOFETIL 250 MG PO CAPS
1000.0000 mg | ORAL_CAPSULE | Freq: Two times a day (BID) | ORAL | Status: DC
  Administered 2013-10-16: 1000 mg via ORAL
  Filled 2013-10-15 (×4): qty 4

## 2013-10-15 MED ORDER — VALGANCICLOVIR HCL 450 MG PO TABS
900.0000 mg | ORAL_TABLET | Freq: Every day | ORAL | Status: DC
  Administered 2013-10-16: 900 mg via ORAL
  Filled 2013-10-15: qty 2

## 2013-10-15 MED ORDER — OXYCODONE HCL 5 MG PO TABS: 5.0000 mg | ORAL_TABLET | ORAL | Status: DC | PRN

## 2013-10-15 MED ORDER — POLYVINYL ALCOHOL 1.4 % OP SOLN
1.0000 [drp] | Freq: Four times a day (QID) | OPHTHALMIC | Status: DC | PRN
  Filled 2013-10-15 (×2): qty 15

## 2013-10-15 MED ORDER — MAGNESIUM OXIDE 400 (241.3 MG) MG PO TABS
400.0000 mg | ORAL_TABLET | Freq: Two times a day (BID) | ORAL | Status: DC
  Administered 2013-10-15 – 2013-10-16 (×2): 400 mg via ORAL
  Filled 2013-10-15 (×3): qty 1

## 2013-10-15 MED ORDER — LEVETIRACETAM 500 MG PO TABS
1000.0000 mg | ORAL_TABLET | Freq: Two times a day (BID) | ORAL | Status: DC
  Administered 2013-10-15 – 2013-10-16 (×2): 1000 mg via ORAL
  Filled 2013-10-15 (×3): qty 2

## 2013-10-15 MED ORDER — LURASIDONE HCL 40 MG PO TABS
40.0000 mg | ORAL_TABLET | Freq: Every day | ORAL | Status: DC
  Administered 2013-10-16: 40 mg via ORAL
  Filled 2013-10-15 (×3): qty 1

## 2013-10-15 MED ORDER — PREDNISONE 5 MG PO TABS
5.0000 mg | ORAL_TABLET | Freq: Every day | ORAL | Status: DC
  Administered 2013-10-16: 5 mg via ORAL
  Filled 2013-10-15 (×3): qty 1

## 2013-10-15 MED ORDER — SENNOSIDES-DOCUSATE SODIUM 8.6-50 MG PO TABS
2.0000 | ORAL_TABLET | Freq: Two times a day (BID) | ORAL | Status: DC
  Administered 2013-10-15 – 2013-10-16 (×2): 2 via ORAL
  Filled 2013-10-15 (×3): qty 2

## 2013-10-15 NOTE — ED Notes (Signed)
MD at bedside. 
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
## 2013-10-15 NOTE — Consult Note (Signed)
Face to face evaluation and I agree with this note 

## 2013-10-15 NOTE — ED Notes (Signed)
Family at bedside. 

## 2013-10-15 NOTE — ED Provider Notes (Signed)
CSN: [id number]     Arrival date & time 10/15/13  1057 History   First MD Initiated Contact with Patient 10/15/13 1058     Chief Complaint  Patient presents with  . Abdominal Pain     (Consider location/radiation/quality/duration/timing/severity/associated sxs/prior Treatment) HPI Comments: Patient presents to the emergency department with chief complaint of abdominal pain. She is brought in by EMS from [place] for Enrichment.  Patient states that she began having abdominal pain today. She states the pain is mostly in her upper abdomen. States pain is mild to moderate. She denies fevers, chills, nausea, vomiting, diarrhea, but does endorse some constipation, new vaginal discharge, as well as frequency of urination. She has not taken anything to alleviate her symptoms. There are no aggravating or alleviating factors.  Additionally, informed by the patient's uncle that she has not been taking her medications and has been hearing and seeing things at daycare.  The history is provided by the patient. No language interpreter was used.    Past Medical History  Diagnosis Date  . Allergy   . Anemia   . Intellectual disability   . Constipation   . Abnormal vaginal bleeding   . Biliary cirrhosis 04/26/11  . 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 DUB (dysfunctional uterine bleeding)   . PONV (postoperative nausea and vomiting)   . Seizures     congenital epilepsy-hx. seizures  . Asthma 09-23-12    Asthma somewhat causing increase wheezing and mild congestion at present  . GERD (gastroesophageal reflux disease)   . Arthritis     right knee  . Vitamin D deficiency    Past Surgical History  Procedure Laterality Date  . Appendectomy    . Cholecystectomy    . Eye muscle surgery    . Eustacean tubes    . Colonoscopy w/ biopsies    . Adenonoidectomy    . Esophagogastroduodenoscopy (egd) with propofol N/A 10/01/2012    Procedure: ESOPHAGOGASTRODUODENOSCOPY (EGD) WITH PROPOFOL;  Surgeon:  Arta Silence, MD;  [facility];  Service: Endoscopy;  Laterality: N/A;  . Liver transplant     Family History  Problem Relation Age of Onset  . Diabetes Mother   . Heart disease Mother   . COPD Mother    History  Substance Use Topics  . Smoking status: Never Smoker   . Smokeless tobacco: Never Used  . Alcohol Use: No   OB History   Grav Para Term Preterm Abortions TAB SAB Ect Mult Living                 Review of Systems  Constitutional: Negative for fever and chills.  Respiratory: Negative for shortness of breath.   Cardiovascular: Negative for chest pain.  Gastrointestinal: Positive for abdominal pain and constipation. Negative for nausea, vomiting and diarrhea.  Genitourinary: Positive for frequency and vaginal discharge. Negative for dysuria.      Allergies  Tramadol hcl; Aspirin; Penicillins; Pseudoephedrine; Sulfa antibiotics; and Latex  Home Medications   Prior to Admission medications   Medication Sig Start Date End Date Taking? Authorizing Provider  albuterol (PROVENTIL,VENTOLIN) 90 MCG/ACT inhaler Inhale 2 puffs into the lungs every 4 (four) hours as needed for wheezing.    Yes Historical Provider, MD  Azelastine HCl (ASTEPRO) 0.15 % SOLN Place 1 spray into the nose 2 (two) times daily as needed (allergic rhinitis).    Yes Historical Provider, MD  budesonide-formoterol (SYMBICORT) 160-4.5 MCG/ACT inhaler Inhale 1 puff into the lungs 2 (two) times daily.  10/12/13  Yes Waylan Boga, NP  calcium-vitamin D (CALCIUM + D) 250-125 MG-UNIT per tablet Take 2 tablets by mouth 2 (two) times daily. 1 pm & 5 pm. 10/12/13  Yes Waylan Boga, NP  carboxymethylcellulose (REFRESH TEARS) 0.5 % SOLN Place 1 drop into both eyes 4 (four) times daily as needed (for dry eyes).   Yes Historical Provider, MD  levETIRAcetam (KEPPRA) 500 MG tablet Take 2 tablets (1,000 mg total) by mouth 2 (two) times daily. 10/12/13  Yes Waylan Boga, NP  magnesium oxide (MAG-OX) 400 MG tablet  Take 400 mg by mouth 2 (two) times daily. 1 pm & 5 pm. 10/12/13  Yes Waylan Boga, NP  montelukast (SINGULAIR) 10 MG tablet Take 10 mg by mouth daily. 9 am. 10/12/13  Yes Waylan Boga, NP  Multiple Vitamin (MULTIVITAMIN) tablet Take 1 tablet by mouth daily. 9 am. 10/12/13  Yes Waylan Boga, NP  mycophenolate (CELLCEPT) 250 MG capsule Take 1,000 mg by mouth 2 (two) times daily. 9 am & 9 pm. 10/12/13  Yes Waylan Boga, NP  omeprazole (PRILOSEC) 40 MG capsule Take 40 mg by mouth daily. 9 am. 10/12/13  Yes Waylan Boga, NP  oxycodone (OXY-IR) 5 MG capsule Take 5-10 mg by mouth every 4 (four) hours as needed for pain.   Yes Historical Provider, MD  polyethylene glycol (MIRALAX / GLYCOLAX) packet Take 17 g by mouth daily as needed (for constipation). In water or juice   Yes Historical Provider, MD  predniSONE (DELTASONE) 5 MG tablet Take 5 mg by mouth daily. At [place]. 10/12/13  Yes Waylan Boga, NP  senna-docusate (SENOKOT-S) 8.6-50 MG per tablet Take 2 tablets by mouth 2 (two) times daily.  10/12/13  Yes Waylan Boga, NP  tacrolimus (PROGRAF) 1 MG capsule Take 3-4 mg by mouth 2 (two) times daily. 4 mg every morning (9 am) and 3 mg (9 pm) every evening. 10/12/13  Yes Waylan Boga, NP  valGANciclovir (VALCYTE) 450 MG tablet Take 2 tablets (900 mg total) by mouth every evening. At 1700 10/12/13  Yes Waylan Boga, NP   BP 114/72  Pulse 103  Temp(Src) 98.3 F (36.8 C) (Oral)  Resp 16  SpO2 100% Physical Exam  Nursing note and vitals reviewed. Constitutional: She is oriented to person, place, and time. She appears well-developed and well-nourished.  HENT:  Head: Normocephalic and atraumatic.  Eyes: Conjunctivae and EOM are normal. Pupils are equal, round, and reactive to light.  Neck: Normal range of motion. Neck supple.  Cardiovascular: Regular rhythm.  Exam reveals no gallop and no friction rub.   No murmur heard. Mildly tachycardic  Pulmonary/Chest: Effort normal and breath sounds normal. No respiratory  distress. She has no wheezes. She has no rales. She exhibits no tenderness.  Abdominal: Soft. Bowel sounds are normal. She exhibits no distension and no mass. There is tenderness. There is no rebound and no guarding.  Mild right upper quadrant tenderness, but no other focal abdominal tenderness, no RLQ tenderness or pain at McBurney's point, no left-sided abdominal tenderness, no fluid wave, or signs of peritonitis   Genitourinary:  Pelvic exam chaperoned by female ER tech, no right or left adnexal tenderness, no uterine tenderness, no vaginal discharge or bleeding, no CMT or friability, no foreign body, no injury to the external genitalia, no other significant findings   Musculoskeletal: Normal range of motion. She exhibits no edema and no tenderness.  Neurological: She is alert and oriented to person, place, and time.  Skin: Skin is warm and dry.  Psychiatric: She has a normal mood and affect. Her behavior is normal. Judgment and thought content normal.    ED Course  Procedures (including critical care time) Results for orders placed during the hospital encounter of 10/15/13  WET PREP, GENITAL      Result Value Ref Range   Yeast Wet Prep HPF POC NONE SEEN  NONE SEEN   Trich, Wet Prep NONE SEEN  NONE SEEN   Clue Cells Wet Prep HPF POC NONE SEEN  NONE SEEN   WBC, Wet Prep HPF POC FEW (*) NONE SEEN  CBC WITH DIFFERENTIAL      Result Value Ref Range   WBC 4.6  4.0 - 10.5 K/uL   RBC 3.35 (*) 3.87 - 5.11 MIL/uL   Hemoglobin 9.0 (*) 12.0 - 15.0 g/dL   HCT 27.4 (*) 36.0 - 46.0 %   MCV 81.8  78.0 - 100.0 fL   MCH 26.9  26.0 - 34.0 pg   MCHC 32.8  30.0 - 36.0 g/dL   RDW 14.3  11.5 - 15.5 %   Platelets 110 (*) 150 - 400 K/uL   Neutrophils Relative % 65  43 - 77 %   Neutro Abs 3.0  1.7 - 7.7 K/uL   Lymphocytes Relative 25  12 - 46 %   Lymphs Abs 1.2  0.7 - 4.0 K/uL   Monocytes Relative 9  3 - 12 %   Monocytes Absolute 0.4  0.1 - 1.0 K/uL   Eosinophils Relative 1  0 - 5 %   Eosinophils  Absolute 0.0  0.0 - 0.7 K/uL   Basophils Relative 1  0 - 1 %   Basophils Absolute 0.0  0.0 - 0.1 K/uL  COMPREHENSIVE METABOLIC PANEL      Result Value Ref Range   Sodium 143  137 - 147 mEq/L   Potassium 4.5  3.7 - 5.3 mEq/L   Chloride 109  96 - 112 mEq/L   CO2 25  19 - 32 mEq/L   Glucose, Bld 116 (*) 70 - 99 mg/dL   BUN 12  6 - 23 mg/dL   Creatinine, Ser 0.63  0.50 - 1.10 mg/dL   Calcium 8.7  8.4 - 10.5 mg/dL   Total Protein 6.8  6.0 - 8.3 g/dL   Albumin 4.1  3.5 - 5.2 g/dL   AST 11  0 - 37 U/L   ALT 11  0 - 35 U/L   Alkaline Phosphatase 66  39 - 117 U/L   Total Bilirubin 0.3  0.3 - 1.2 mg/dL   GFR calc non Af Amer >90  >90 mL/min   GFR calc Af Amer >90  >90 mL/min  LIPASE, BLOOD      Result Value Ref Range   Lipase 31  11 - 59 U/L  URINALYSIS, ROUTINE W REFLEX MICROSCOPIC      Result Value Ref Range   Color, Urine YELLOW  YELLOW   APPearance CLEAR  CLEAR   Specific Gravity, Urine 1.013  1.005 - 1.030   pH 6.5  5.0 - 8.0   Glucose, UA NEGATIVE  NEGATIVE mg/dL   Hgb urine dipstick NEGATIVE  NEGATIVE   Bilirubin Urine NEGATIVE  NEGATIVE   Ketones, ur NEGATIVE  NEGATIVE mg/dL   Protein, ur NEGATIVE  NEGATIVE mg/dL   Urobilinogen, UA 0.2  0.0 - 1.0 mg/dL   Nitrite NEGATIVE  NEGATIVE   Leukocytes, UA NEGATIVE  NEGATIVE  POC URINE PREG, ED      Result Value Ref  Range   Preg Test, Ur NEGATIVE  NEGATIVE   No results found.   Imaging Review No results found.   EKG Interpretation None      MDM   Final diagnoses:  Abdominal pain  Hallucinations    Patient with mild upper abdominal tenderness.  She has had cholecystectomy and appendectomy.  No sign of peritonitis.  Labs are reassuring.  No further emergent workup at this time. Patient is medically clear.  Patient will need psych evaluation.  Visual and auditory hallucinations.  Discussed with Dr. Alvino Chapel, who agrees with the plan.  Psych hold orders placed.  Will move to psych ED.   Montine Circle,  PA-C 10/15/13 1447

## 2013-10-15 NOTE — ED Notes (Signed)
Pt in from [place] for Enrichment via [place], per report pt had altered mental status onset 10/13/13 when seen at [facility]  For altered mental status, per report the pt lives with her uncle & has not taken her medications, family unable to state exactly how she was not acting her baseline, unknown baseline, hx of MR, A&O x4, follows commands, speaks in complete sentences, pt c/o abd pain-LUQ, denies n/v/d

## 2013-10-15 NOTE — ED Notes (Signed)
Marlon Pel, MD at bedside.

## 2013-10-15 NOTE — ED Notes (Signed)
Pt continues to yell & cry when the music stops, Lilia Pro, Therapist, sports, Charge RN & Alvino Chapel, MD notified re: pts inconsalibilty

## 2013-10-15 NOTE — BH Assessment (Signed)
Attempted to contact EDP for report. ED secretary said EDP is in a code and unavailable. To avoid further delay tele-assessment with be initiated.  Orpah Greek Rosana Hoes, [facility] Triage Specialist [phone]

## 2013-10-16 DIAGNOSIS — F200 Paranoid schizophrenia: Secondary | ICD-10-CM

## 2013-10-16 LAB — GC/CHLAMYDIA PROBE AMP
CT Probe RNA: NEGATIVE
GC Probe RNA: NEGATIVE

## 2013-10-16 MED ORDER — HYDROXYZINE HCL 25 MG PO TABS: 25.0000 mg | ORAL_TABLET | Freq: Four times a day (QID) | ORAL | Status: DC | PRN

## 2013-10-16 MED ORDER — LORAZEPAM 1 MG PO TABS
1.0000 mg | ORAL_TABLET | Freq: Four times a day (QID) | ORAL | Status: DC | PRN
  Administered 2013-10-16: 1 mg via ORAL
  Filled 2013-10-16: qty 1

## 2013-10-16 MED ORDER — ALBUTEROL SULFATE HFA 108 (90 BASE) MCG/ACT IN AERS
2.0000 | INHALATION_SPRAY | RESPIRATORY_TRACT | Status: DC | PRN
  Administered 2013-10-16: 2 via RESPIRATORY_TRACT
  Filled 2013-10-16: qty 6.7

## 2013-10-16 NOTE — Discharge Instructions (Signed)
Follow up with [facility] and [facility] along with [facility]   Schizophrenia Schizophrenia is a mental illness. It may cause disturbed or disorganized thinking, speech, or behavior. People with schizophrenia have problems functioning in one or more areas of life: work, school, home, or relationships. People with schizophrenia are at increased risk for suicide, certain chronic physical illnesses, and unhealthy behaviors, such as smoking and drug use. People who have family members with schizophrenia are at higher risk of developing the illness. Schizophrenia affects men and women equally but usually appears at an earlier age (teenage or early adult years) in men.  SYMPTOMS The earliest symptoms are often subtle (prodrome) and may go unnoticed until the illness becomes more severe (first-break psychosis). Symptoms of schizophrenia may be continuous or may come and go in severity. Episodes often are triggered by major life events, such as family stress, college, [employer], marriage, pregnancy or child birth, divorce, or loss of a loved one. People with schizophrenia may see, hear, or feel things that do not exist (hallucinations). They may have false beliefs in spite of obvious proof to the contrary (delusions). Sometimes speech is incoherent or behavior is odd or withdrawn.  DIAGNOSIS Schizophrenia is diagnosed through an assessment by your caregiver. Your caregiver will ask questions about your thoughts, behavior, mood, and ability to function in daily life. Your caregiver may ask questions about your medical history and use of alcohol or drugs, including prescription medication. Your caregiver may also order blood tests and imaging exams. Certain medical conditions and substances can cause symptoms that resemble schizophrenia. Your caregiver may refer you to a mental health specialist for evaluation. There are three major criterion for a diagnosis of schizophrenia:  Two or more of  the following five symptoms are present for a month or longer:  Delusions. Often the delusions are that you are being attacked, harassed, cheated, persecuted or conspired against (persecutory delusions).  Hallucinations.   Disorganized speech that does not make sense to others.  Grossly disorganized (confused or unfocused) behavior or extremely overactive or underactive motor activity (catatonia).  Negative symptoms such as bland or blunted emotions (flat affect), loss of will power (avolition), and withdrawal from social contacts (social isolation).  Level of functioning in one or more major areas of life (work, school, relationships, or self-care) is markedly below the level of functioning before the onset of illness.   There are continuous signs of illness (either mild symptoms or decreased level of functioning) for at least 6 months or longer. TREATMENT  Schizophrenia is a long-term illness. It is best controlled with continuous treatment rather than treatment only when symptoms occur. The following treatments are used to manage schizophrenia:  Medication Medication is the most effective and important form of treatment for schizophrenia. Antipsychotic medications are usually prescribed to help manage schizophrenia. Other types of medication may be added to relieve any symptoms that may occur despite the use of antipsychotic medications.  Counseling or talk therapy Individual, group, or family counseling may be helpful in providing education, support, and guidance. Many people with schizophrenia also benefit from social skills and job skills (vocational) training. A combination of medication and counseling is best for managing the disorder over time. A procedure in which electricity is applied to the brain through the scalp (electroconvulsive therapy) may be used to treat catatonic schizophrenia or schizophrenia in people who cannot take or do not respond to medication and  counseling. Document Released: 05/11/2000 Document Revised: 01/14/2013 Document Reviewed: 08/06/2012  ExitCare Patient Information 2014 [employer].

## 2013-10-16 NOTE — ED Provider Notes (Signed)
Patient not seen by me and I was not directly involved in the patient care. Behavioral Health/Psychiatry has evaluated the patient and, as per their note and recommendation, patient was to be discharged. Follow-up recommendations and medications determined by/prescribed by Behavioral Health/Psychiatry.   Orpah Greek, MD 10/16/13 1435

## 2013-10-16 NOTE — ED Notes (Signed)
Called pt uncle to come take her home. He is aware doctor has written discharge orders. States [facility] is going [facility] about transfer to their hospital. Dr Betsey Holiday made aware. [name] did call and referred them to talk to dr Betsey Holiday.

## 2013-10-16 NOTE — ED Notes (Signed)
PATIENT [name] UNCLE CALL [phone]

## 2013-10-16 NOTE — ED Notes (Addendum)
Heloise Purpura doing pt telepsych at this time, he is going to call pt uncle about discharge planning

## 2013-10-16 NOTE — ED Notes (Signed)
Pt belongings sent with pt and her uncle Malaysha Arlen.
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
## 2013-10-16 NOTE — ED Notes (Signed)
Family called and reports that [name] is concerned about patient being discharged due to non compliance with her medications.  Patient is post liver transplant and requires compliance with her medications.  Currently patient is resting quietly in her bed.

## 2013-10-16 NOTE — ED Notes (Signed)
PATIENT NOW COMPLAINING OF COUGH AND FEELING TIGHT IN HER CHEST. SHE IS ABLE TO SPEAK CLEARLY AND IN COMPLETE SENTENCES.

## 2013-10-16 NOTE — ED Provider Notes (Signed)
Patient resting comfortably this morning, no overnight complaints. Ongoing behavioral health evaluation for mental status changes.  Filed Vitals:   10/16/13 0653  BP: 112/60  Pulse: 102  Temp: 98.3 F (36.8 C)  Resp: 18     Orpah Greek, MD 10/16/13 [phone]

## 2013-10-19 NOTE — ED Provider Notes (Signed)
Medical screening examination/treatment/procedure(s) were performed by non-physician practitioner and as supervising physician I was immediately available for consultation/collaboration.   EKG Interpretation None       Jasper Riling. Alvino Chapel, MD 10/19/13 [phone]

## 2013-11-18 ENCOUNTER — Telehealth: Payer: Self-pay | Admitting: Neurology

## 2013-11-18 NOTE — Telephone Encounter (Signed)
Spoke with Gwenyth Ober a med student at [facility] [employer] part of the psychiatry team about patient.  The patient is an inpatient and she is interested in some details about her type of seizures.  She would like to speak to the doctor, and also would like her input about changing her seizure meds.  She can be reached at [phone].  I relayed that I would send patient's office notes for them to review.

## 2013-11-19 NOTE — Telephone Encounter (Signed)
I have called and left message for her to call back.

## 2013-11-23 ENCOUNTER — Encounter: Payer: Self-pay | Admitting: Family Medicine

## 2013-11-23 ENCOUNTER — Telehealth: Payer: Self-pay | Admitting: Clinical

## 2013-11-23 DIAGNOSIS — Z944 Liver transplant status: Secondary | ICD-10-CM | POA: Insufficient documentation

## 2013-11-23 NOTE — Telephone Encounter (Signed)
CSW received a call from pt DSS Social Worker Carney Harder [phone]. Dendra informed CSW that pt has been at [facility] and the CSW there is recommending that pt be followed by a psychiatrist. CSW provided Dendra the contact number for [facility] and suggested that pt be follow by the ACT team. Dendra appreciative of information and will contact [facility].  Hunt Oris, MSW, [place]

## 2013-12-25 ENCOUNTER — Telehealth: Payer: Self-pay | Admitting: Neurology

## 2013-12-25 NOTE — Telephone Encounter (Signed)
Patient states she just came back from the hospital and they changed her Depakote dosage, patient is requesting refill. If questions, please call.

## 2013-12-25 NOTE — Telephone Encounter (Signed)
I called the patient back.  Relayed Dr Rhea Belton message.  She verbalized understanding and says she will contact her psychiatrist to see if they will refill meds, if not she will contact PCP.

## 2013-12-25 NOTE — Telephone Encounter (Signed)
I called the patient back.  She said she was seen at the hospital and they prescribed Depakote.  States [facility] and they gave her a Rx with no refills.  Says she saw Dr Jimmye Norman while she was admitted.  I called [facility].  Spoke with the pharmacist. She said Primus Bravo wrote Depakote DR 250mg  5 tabs (1250mg  total) qhs on 07/06.   Patient would like to know if Dr [name] will Josem Kaufmann Rx.  Okay to add this med to list and sent refills?  Please advise.  Thank you.

## 2013-12-25 NOTE — Telephone Encounter (Signed)
Jessica:  Please advise patient that she should contact prescribing physician or her primary care for Rx of Depakote.

## 2014-01-01 ENCOUNTER — Telehealth: Payer: Self-pay

## 2014-01-01 NOTE — Telephone Encounter (Signed)
Per Hoyle Sauer, after reviewing notes from [facility], it is okay for [place] to fill Depakote if needed.  I called and spoke with Pharmacist at [facility]  She said the patient did pick up a refill of Depakote from another provider on 08/03 and has one additional refill remaining.

## 2014-02-04 ENCOUNTER — Ambulatory Visit: Payer: Self-pay | Admitting: Family Medicine

## 2014-02-12 ENCOUNTER — Encounter: Payer: Self-pay | Admitting: Family Medicine

## 2014-02-12 ENCOUNTER — Ambulatory Visit (INDEPENDENT_AMBULATORY_CARE_PROVIDER_SITE_OTHER): Payer: Medicaid Other | Admitting: Family Medicine

## 2014-02-12 VITALS — BP 118/81 | HR 87 | Temp 98.3°F | Ht 64.0 in | Wt 154.8 lb

## 2014-02-12 DIAGNOSIS — B9689 Other specified bacterial agents as the cause of diseases classified elsewhere: Secondary | ICD-10-CM | POA: Insufficient documentation

## 2014-02-12 DIAGNOSIS — J208 Acute bronchitis due to other specified organisms: Principal | ICD-10-CM

## 2014-02-12 DIAGNOSIS — J209 Acute bronchitis, unspecified: Secondary | ICD-10-CM

## 2014-02-12 DIAGNOSIS — A499 Bacterial infection, unspecified: Secondary | ICD-10-CM

## 2014-02-12 DIAGNOSIS — Z944 Liver transplant status: Secondary | ICD-10-CM

## 2014-02-12 MED ORDER — AZITHROMYCIN 250 MG PO TABS: ORAL_TABLET | ORAL | Status: DC

## 2014-02-12 MED ORDER — DIVALPROEX SODIUM 250 MG PO DR TAB: 1250.0000 mg | DELAYED_RELEASE_TABLET | Freq: Every day | ORAL | Status: DC

## 2014-02-12 MED ORDER — CYCLOSPORINE MODIFIED (GENGRAF) 25 MG PO CAPS: 275.0000 mg | ORAL_CAPSULE | Freq: Two times a day (BID) | ORAL | Status: DC

## 2014-02-12 NOTE — Progress Notes (Signed)
   Subjective:    Patient ID: [name], female    DOB: December 15, 1975, 38 y.o.   MRN: [id number]  HPI Patient presents for SDA for persistent cough with clear sputum for the past four weeks. Began with headache which improved after a few days but was left with the cough, which has gotten worse. Worse coughing at night, sometimes in coughing fits that sound like paroxysms. Denies fevers or chills. Had been around sick contacts on the transportation Lucianne Lei she was traveling in.  She is a never-smoker.   Has history of asthma, is using her albuterol HFA rescue inhaler about three times daily since onset of illness. Continues to use Symbicort twice daily as maintenance med.   Patient is s/p liver transplant at [facility], followed by transplant service and is maintained on anti-rejection meds.  These meds are updated on med list.   Review of Systems     Objective:   Physical Exam Generally well-appearing, no apparent distress. No increased work of breathing. Speaking fluidly. HEENT neck supple, no cervical adenopathy. No frontal or maxillary sinus tenderness. TMs clear bilaterally. Oropharynx unremarkable.  COR Regular S1S2, no extra sounds PULM Clear bilaterally, no wheezes, no rales. Good air movement throughout. FiO2 is 100% on room air today.        Assessment & Plan:

## 2014-02-12 NOTE — Assessment & Plan Note (Signed)
Four weeks of coughing paroxysms with sputum production, no wheezing, no fevers. Consider atypical PNA versus bronchitis, given duration of symptoms. No findings to suggest asthma flare.

## 2014-02-12 NOTE — Patient Instructions (Signed)
It was a pleasure to see you today.  I believe your cough is due to a bacterial bronchitis.   I sent a prescription for AZITHROMYCIN 250mg  tablets, take 2 tablets by mouth on day one, then 1 tablet daily for the next 4 days.   FOLLOW UP APPOINTMENT WITH YOUR PRIMARY DOCTOR.

## 2014-02-16 ENCOUNTER — Encounter: Payer: Self-pay | Admitting: Neurology

## 2014-02-16 ENCOUNTER — Other Ambulatory Visit: Payer: Medicaid Other

## 2014-02-16 ENCOUNTER — Ambulatory Visit (INDEPENDENT_AMBULATORY_CARE_PROVIDER_SITE_OTHER): Payer: Medicaid Other | Admitting: Neurology

## 2014-02-16 VITALS — BP 122/87 | HR 68 | Ht 61.0 in | Wt 156.0 lb

## 2014-02-16 DIAGNOSIS — R569 Unspecified convulsions: Secondary | ICD-10-CM

## 2014-02-16 MED ORDER — LACOSAMIDE 100 MG PO TABS: ORAL_TABLET | ORAL | Status: DC

## 2014-02-16 NOTE — Patient Instructions (Signed)
Tapering off Depakote ER 250mg ,   You are taking 5 tabs now, you should take 4 tabs every night for one week, then 3 tabs every night for one week, then 2 tabs every  Night for one week, then stop

## 2014-02-16 NOTE — Progress Notes (Signed)
[facility]  PATIENT: [name] DOB: 01-03-1976    HISTORY OF PRESENT ILLNESS:[name], 38 year old black female returns today for followup of epilepsy.   She has a history of mild mental retardation, and long-standing history of seizures. She was  well-controlled on Depakote and Tegretol extended release.  But she has developed liver disease, was diagnosed with primary biliary cirrhosis,  Antieptical medication was changed since February 2013, she is now taking Vimpat 100 mg 2 tabs twice a day, Keppra 500 mg 3 tabs daily. She has no significant seizure activities while taking Vimpat, and Keppra.  She lives alone, uncles lives next door, she attends adult daycare regularly.  She underwent liver transplantation in February 2015, doing very well, on polypharmacy treatment, this including CellCept 1000 mg twice a day, Prograft 4 mg twice a day,  For her seizure, she is now taking deapkote ER 250mg  5 tabs qhs, she is having seizure about once a month, it usually happens at nighttime, she was noted to have nighttime extremity jumping movement, tongue biting when she woke up, she also complains of mild gait difficullty,   REVIEW OF SYSTEMS: Full 14 system review of systems performed and notable only for:  Muscle cramps, cough, wheezing  ALLERGIES: Allergies  Allergen Reactions  . Tramadol Hcl Itching and Nausea And Vomiting    Causes SEIZURES  . Aspirin Nausea And Vomiting    Due to Stomach ulcer  . Penicillins Itching, Swelling and Rash       . Pseudoephedrine Itching and Swelling    Tongue swelling  . Sulfa Antibiotics Nausea And Vomiting  . Latex Itching and Rash    HOME MEDICATIONS: Outpatient Prescriptions Prior to Visit  Medication Sig Dispense Refill  . albuterol (PROVENTIL,VENTOLIN) 90 MCG/ACT inhaler Inhale 2 puffs into the lungs every 4 (four) hours as needed for wheezing.       . Azelastine HCl (ASTEPRO) 0.15 % SOLN Place 1 spray into the nose  2 (two) times daily as needed (allergic rhinitis).       Marland Kitchen azithromycin (ZITHROMAX) 250 MG tablet Take 2 tablets by mouth on day one, then take 1 tablet daily for the next 4 days  6 tablet  0  . budesonide-formoterol (SYMBICORT) 160-4.5 MCG/ACT inhaler Inhale 1 puff into the lungs 2 (two) times daily.   1 Inhaler    . calcium-vitamin D (CALCIUM + D) 250-125 MG-UNIT per tablet Take 2 tablets by mouth 2 (two) times daily. 1 pm & 5 pm.      . carboxymethylcellulose (REFRESH TEARS) 0.5 % SOLN Place 1 drop into both eyes 4 (four) times daily as needed (for dry eyes).      . cycloSPORINE modified 25 MG CAPS Take 275 mg by mouth every 12 (twelve) hours.  540 capsule    . divalproex (DEPAKOTE) 250 MG DR tablet Take 5 tablets (1,250 mg total) by mouth at bedtime. Take 1 tablet in the morning, 1 tablet at noon, and 2 tablets at bedtime      . magnesium oxide (MAG-OX) 400 MG tablet Take 400 mg by mouth 2 (two) times daily. 1 pm & 5 pm.      . montelukast (SINGULAIR) 10 MG tablet Take 10 mg by mouth daily. 9 am.  30 tablet  12  . Multiple Vitamin (MULTIVITAMIN) tablet Take 1 tablet by mouth daily. 9 am.      . mycophenolate (CELLCEPT) 250 MG capsule Take 1,000 mg by mouth 2 (two) times daily. 9  am & 9 pm.      . omeprazole (PRILOSEC) 40 MG capsule Take 40 mg by mouth daily. 9 am.      . oxycodone (OXY-IR) 5 MG capsule Take 5-10 mg by mouth every 4 (four) hours as needed for pain.      . polyethylene glycol (MIRALAX / GLYCOLAX) packet Take 17 g by mouth daily as needed (for constipation). In water or juice      . predniSONE (DELTASONE) 5 MG tablet Take 5 mg by mouth daily. At 9am.      . senna-docusate (SENOKOT-S) 8.6-50 MG per tablet Take 2 tablets by mouth 2 (two) times daily.       . tacrolimus (PROGRAF) 1 MG capsule Take 3-4 mg by mouth 2 (two) times daily. 4 mg every morning (9 am) and 3 mg (9 pm) every evening.      . valGANciclovir (VALCYTE) 450 MG tablet Take 2 tablets (900 mg total) by mouth every  evening. At 1700       No facility-administered medications prior to visit.    PAST MEDICAL HISTORY: Past Medical History  Diagnosis Date  . Allergy   . Anemia   . Intellectual disability   . Constipation   . Abnormal vaginal bleeding   . Biliary cirrhosis 04/26/11  . 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 DUB (dysfunctional uterine bleeding)   . PONV (postoperative nausea and vomiting)   . Seizures     congenital epilepsy-hx. seizures  . Asthma 09-23-12    Asthma somewhat causing increase wheezing and mild congestion at present  . GERD (gastroesophageal reflux disease)   . Arthritis     right knee  . Vitamin D deficiency     PAST SURGICAL HISTORY: Past Surgical History  Procedure Laterality Date  . Appendectomy    . Cholecystectomy    . Eye muscle surgery    . Eustacean tubes    . Colonoscopy w/ biopsies    . Adenonoidectomy    . Esophagogastroduodenoscopy (egd) with propofol N/A 10/01/2012    Procedure: ESOPHAGOGASTRODUODENOSCOPY (EGD) WITH PROPOFOL;  Surgeon: Arta Silence, MD;  Location: [facility];  Service: Endoscopy;  Laterality: N/A;  . Liver transplant      FAMILY HISTORY: Family History  Problem Relation Age of Onset  . Diabetes Mother   . Heart disease Mother   . COPD Mother     SOCIAL HISTORY: History   Social History  . Marital Status: Single    Spouse Name: N/A    Number of Children: N/A  . Years of Education: N/A   Occupational History  . Not on file.   Social History Main Topics  . Smoking status: Never Smoker   . Smokeless tobacco: Never Used  . Alcohol Use: No  . Drug Use: No  . Sexual Activity: No   Other Topics Concern  . Not on file   Social History Narrative  Patient does not work, she has a 12th grade education, she is single lives alone  PHYSICAL EXAM  Filed Vitals:   02/16/14 1130  BP: 122/87  Pulse: 68  Height: 5\' 1"  (1.549 m)  Weight: 156 lb (70.761 kg)   Body mass index is 29.49 kg/(m^2).  Generalized:  Well developed, in no acute distress  Head: normocephalic and atraumatic,. Oropharynx benign  Neck: Supple, no carotid bruits  Cardiac: Regular rate rhythm, no murmur  Musculoskeletal: No deformity   Neurological examination   Mentation: Alert oriented to time, place, history taking. Follows all  commands, hesitant with speech mild MR  Cranial nerve II-XII:Pupils were equal round reactive to light extraocular movements were full, visual field were full on confrontational test. She has mild right lower face weakness. hearing was intact to finger rubbing bilaterally. Uvula tongue midline. head turning and shoulder shrug and were normal and symmetric. Tongue protrusion to the right. Motor: normal bulk and tone, full strength in the BUE, BLE, fine finger movements normal, no pronator drift. No focal weakness Coordination: finger-nose-finger, heel-to-shin bilaterally, no dysmetria Reflexes: Depressed and symmetric Gait and Station: Rising up from seated position without assistance, normal stance,  moderate stride, holds right arm and right elbow in flexion, smooth turning, able to perform tiptoe, and heel walking without difficulty.   DIAGNOSTIC DATA (LABS, IMAGING, TESTING) - I reviewed patient records, labs, notes, testing and imaging myself where available.  Lab Results  Component Value Date   WBC 4.6 10/15/2013   HGB 9.0* 10/15/2013   HCT 27.4* 10/15/2013   MCV 81.8 10/15/2013   PLT 110* 10/15/2013      Component Value Date/Time   NA 143 10/15/2013 1216   K 4.5 10/15/2013 1216   CL 109 10/15/2013 1216   CO2 25 10/15/2013 1216   GLUCOSE 116* 10/15/2013 1216   BUN 12 10/15/2013 1216   CREATININE 0.63 10/15/2013 1216   CREATININE 0.62 05/19/2013 1514   CALCIUM 8.7 10/15/2013 1216   CALCIUM 9.0 09/11/2007 2301   PROT 6.8 10/15/2013 1216   ALBUMIN 4.1 10/15/2013 1216   AST 11 10/15/2013 1216   ALT 11 10/15/2013 1216   ALKPHOS 66 10/15/2013 1216   BILITOT 0.3 10/15/2013 1216   GFRNONAA >90 10/15/2013  1216   GFRNONAA >89 11/09/2011 1208   GFRAA >90 10/15/2013 1216   GFRAA >89 11/09/2011 1208      Lab Results  Component Value Date   TSH 2.548 01/14/2013      ASSESSMENT AND PLAN  38 y.o. year old female  has a past medical history of Anemia; Intellectual disability, primary biliary cirrhosis, s/p liver transplant in Feb 2015,  she is now taking Depakote ER 250 mg 5 tablets every night, she continued to have recurrent seizure.  1. Repeat EEG 2. With her history of liver transplant, Depakote is not the best choice, will change to Vimpat 100 mg 2 tablets twice a day 3. If she has significant abnormal EEG, continued to have seizure while taking Vimpat, may consider adding on Keppra 500 mg twice a day 4. Return to clinic in one month   Marcial Pacas, M.D. Ph.D.  [facility] [address], [place], [place] [address] [phone]

## 2014-02-18 ENCOUNTER — Ambulatory Visit: Payer: Medicaid Other | Admitting: Nurse Practitioner

## 2014-02-18 ENCOUNTER — Ambulatory Visit: Payer: Medicaid Other | Admitting: Neurology

## 2014-02-23 ENCOUNTER — Ambulatory Visit (INDEPENDENT_AMBULATORY_CARE_PROVIDER_SITE_OTHER): Payer: Medicaid Other

## 2014-02-23 ENCOUNTER — Telehealth: Payer: Self-pay | Admitting: Neurology

## 2014-02-23 ENCOUNTER — Other Ambulatory Visit (INDEPENDENT_AMBULATORY_CARE_PROVIDER_SITE_OTHER): Payer: Medicaid Other

## 2014-02-23 DIAGNOSIS — R569 Unspecified convulsions: Secondary | ICD-10-CM

## 2014-02-23 DIAGNOSIS — Z0289 Encounter for other administrative examinations: Secondary | ICD-10-CM

## 2014-02-23 MED ORDER — DIVALPROEX SODIUM 250 MG PO DR TAB: 1250.0000 mg | DELAYED_RELEASE_TABLET | Freq: Every day | ORAL | Status: DC

## 2014-02-23 NOTE — Telephone Encounter (Signed)
Patient called to let us know that her transportation was running late.  I let Edwena Felty know patient may be late for her EEG.

## 2014-02-23 NOTE — Telephone Encounter (Signed)
I have talked with [name], Dr. Glendell Docker, her liver transplant physician,   She has psychosis episode in July 2015 following liver transplant, was admitted to [facility], she was on Vimpat, and Keppra for a while, with worsening behavior issues.  She was hallucination, paranoid, only with Depakote that she stabilized. She was almost to be institulized.   His uncle does not believing that [name] is having seizures.    I have called her uncle, Sonia Side, who is her health POA, who confirmed above informations, I refilled her Depakote ER 250 mg 5 tablets each day, with 3 refills, keep followup appointment October 20 seventh

## 2014-02-23 NOTE — Telephone Encounter (Signed)
Collie Siad with [facility] @ [phone], calling to check status of Office notes and fax cover sheet.  Basically taking patient off Depakote and back to Vimpat.  Faxed over on 9/25.  Please call and advise.

## 2014-02-23 NOTE — Procedures (Signed)
   HISTORY: 38 years old female, with history of epilepsy presenting with recurrent seizure-like event, history of liver transplant  TECHNIQUE:  16 channel EEG was performed based on standard 10-16 international system. One channel was dedicated to EKG, which has demonstrates normal sinus rhythm of 78 beats per minutes.  Upon awakening, the posterior background activity was well-developed, in alpha range, 8 Hz with amplitude of 30 microvoltage, reactive to eye opening and closure.  There was no evidence of epileptiform discharge.  Photic stimulation was not performed  Hyperventilation was performed, there was no abnormality elicit.  Stage II sleep was achieved, as evident by K complex, and sleep spindles.  CONCLUSION: This is a  normal awake and asleep EEG.  There is no electrodiagnostic evidence of epileptiform discharge

## 2014-02-24 LAB — COMPREHENSIVE METABOLIC PANEL
ALBUMIN: 3.3 g/dL — AB (ref 3.5–5.5)
ALT: 38 IU/L — ABNORMAL HIGH (ref 0–32)
AST: 49 IU/L — ABNORMAL HIGH (ref 0–40)
Albumin/Globulin Ratio: 1.3 (ref 1.1–2.5)
Alkaline Phosphatase: 80 IU/L (ref 39–117)
BUN/Creatinine Ratio: 24 — ABNORMAL HIGH (ref 8–20)
BUN: 24 mg/dL — AB (ref 6–20)
CO2: 22 mmol/L (ref 18–29)
Calcium: 8.4 mg/dL — ABNORMAL LOW (ref 8.7–10.2)
Chloride: 102 mmol/L (ref 97–108)
Creatinine, Ser: 1 mg/dL (ref 0.57–1.00)
GFR calc Af Amer: 82 mL/min/{1.73_m2} (ref >59)
GFR calc non Af Amer: 71 mL/min/{1.73_m2} (ref >59)
GLUCOSE: 95 mg/dL (ref 65–99)
Globulin, Total: 2.6 g/dL (ref 1.5–4.5)
Potassium: 4.9 mmol/L (ref 3.5–5.2)
Sodium: 139 mmol/L (ref 134–144)
TOTAL PROTEIN: 5.9 g/dL — AB (ref 6.0–8.5)
Total Bilirubin: 0.4 mg/dL (ref 0.0–1.2)

## 2014-02-24 LAB — CBC
HEMATOCRIT: 28.2 % — AB (ref 34.0–46.6)
Hemoglobin: 9.4 g/dL — ABNORMAL LOW (ref 11.1–15.9)
MCH: 26.5 pg — ABNORMAL LOW (ref 26.6–33.0)
MCHC: 33.3 g/dL (ref 31.5–35.7)
MCV: 79 fL (ref 79–97)
PLATELETS: 101 10*3/uL — AB (ref 150–379)
RBC: 3.55 x10E6/uL — AB (ref 3.77–5.28)
RDW: 17.7 % — ABNORMAL HIGH (ref 12.3–15.4)
WBC: 2 10*3/uL — AB (ref 3.4–10.8)

## 2014-02-24 LAB — VALPROIC ACID LEVEL: Valproic Acid Lvl: 63 ug/mL (ref 50–100)

## 2014-02-26 ENCOUNTER — Telehealth: Payer: Self-pay | Admitting: Neurology

## 2014-02-26 DIAGNOSIS — R569 Unspecified convulsions: Secondary | ICD-10-CM

## 2014-02-26 NOTE — Telephone Encounter (Signed)
I have called her for normal VPA level 63,   WBCs only 2 point 0, anemia,  I have advised her to come in next week for repeat laboratory evaluation, fax the lab results to her primary care  Hinton Dyer, please call her again, make sure she come in for repeat laboratory evaluation

## 2014-03-01 NOTE — Telephone Encounter (Signed)
Called and spoke to patient and wants to know if she can wait until October 13th witch is next Tuesday I have appt's all this week this week to have labs redrawn.

## 2014-03-01 NOTE — Telephone Encounter (Signed)
Patient returning call to [facility], please call back and advise.

## 2014-03-01 NOTE — Telephone Encounter (Signed)
Called patient and left her a message to come get repeat labs done. Explained lab hours.

## 2014-03-02 NOTE — Telephone Encounter (Signed)
Called patient back and told her ok to wait. Patient understood.

## 2014-03-02 NOTE — Telephone Encounter (Signed)
It is Ok to change appt to her convenience.

## 2014-03-05 ENCOUNTER — Ambulatory Visit (INDEPENDENT_AMBULATORY_CARE_PROVIDER_SITE_OTHER): Payer: Medicaid Other | Admitting: Family Medicine

## 2014-03-05 ENCOUNTER — Encounter: Payer: Self-pay | Admitting: Family Medicine

## 2014-03-05 VITALS — BP 126/81 | HR 87 | Temp 98.2°F | Ht 61.0 in | Wt 162.5 lb

## 2014-03-05 DIAGNOSIS — Z23 Encounter for immunization: Secondary | ICD-10-CM | POA: Diagnosis present

## 2014-03-05 DIAGNOSIS — Z0000 Encounter for general adult medical examination without abnormal findings: Secondary | ICD-10-CM

## 2014-03-05 DIAGNOSIS — Z944 Liver transplant status: Secondary | ICD-10-CM

## 2014-03-05 DIAGNOSIS — G40909 Epilepsy, unspecified, not intractable, without status epilepticus: Secondary | ICD-10-CM

## 2014-03-05 DIAGNOSIS — F200 Paranoid schizophrenia: Secondary | ICD-10-CM

## 2014-03-05 NOTE — Patient Instructions (Signed)
It was nice meeting you today.  Please make an appointment with in the next month so that we can perform a pap smear (to look for abnormal cells that can cause cervical cancer).

## 2014-03-06 ENCOUNTER — Encounter: Payer: Self-pay | Admitting: Family Medicine

## 2014-03-06 DIAGNOSIS — Z0000 Encounter for general adult medical examination without abnormal findings: Secondary | ICD-10-CM | POA: Insufficient documentation

## 2014-03-06 HISTORY — DX: Encounter for general adult medical examination without abnormal findings: Z00.00

## 2014-03-06 NOTE — Assessment & Plan Note (Signed)
Patient with good follow up at [facility]. Cellcept decreased by transplant team as her WBC was 2.0.  She continues to take Prograft, cyclosporin, and valganciclovir  Denies signs of infection give immunocompromised state.

## 2014-03-06 NOTE — Progress Notes (Signed)
Patient ID: [name], female   DOB: 08-06-1975, 38 y.o.   MRN: [id number] [facility]  Lori Patten, MD  Subjective:  Chief complaint: meet new MD, get flu shot   [name] is a 38 y/o female with a PMH of primary biliary cirrhosis s/p liver transplant in 06/2013, recurrent seizure like activity, mental retardation and paranoid type schizophrenia presenting to meet her new MD and get a flu shot  Primary biliary cirrhosis s/p liver transplant in 06/2013: Patient doing well. Saw her transplant MD at [facility] yesterday who was worried about her WBC being low (2.0), therefore her Cellcept was decreased to 250mg  BID. She continues to take Prograft, cyclosporin, and valganciclovir as noted in her meds below.  She has a lab appointment tomorrow to have a repeat CBC drawn. She denies any symptoms of infection: no URI symptoms, cough, dysuria, urinary urgency, urinary frequency. She is having regular bowel movements.   Seizure like activity: States her last seizure was in June after they attempted to change her from Depakote to another antiepileptic in light of her liver transplant. Denies any headaches, change in vision, weakness, numbness/tingling.   Schizophrenia, paranoid type: Denies any AVH. No HI/SI. Continues to take Risperdal. The Depakote is also beneficial to preventing hallucinations. She has been hospitalized in the past due to agitation and psychosis.    ROS-   Past Medical History Patient Active Problem List   Diagnosis Date Noted  . Health care maintenance 03/06/2014  . Acute bronchitis, bacterial 02/12/2014  . H/O liver transplant 11/23/2013  . Thrombocytopenia 03/09/2013  . Seizure disorder 03/01/2013  . Schizophrenia, paranoid type 06/25/2012    Class: Chronic  . 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 Vision loss, bilateral 03/30/2012  . 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 Menorrhagia 12/22/2010  . Unspecified vitamin D deficiency 09/13/2010  . HEMATURIA  UNSPECIFIED 06/14/2010  . RECTAL BLEEDING, HX OF 06/14/2010  . ANEMIA 03/03/2010  . GERD 07/21/2008  . 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 CARDIAC FLOW MURMUR 10/16/2007  . EPILEPSY 08/23/2006  . ALLERGIC RHINITIS 07/30/2006  . OBESITY, NOS 07/25/2006  . 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 MENTAL RETARDATION 07/25/2006  . ASTHMA, PERSISTENT 07/25/2006  . OSTEOARTHRITIS, LOWER LEG 07/25/2006    Medications- reviewed and updated Current Outpatient Prescriptions  Medication Sig Dispense Refill  . albuterol (PROVENTIL,VENTOLIN) 90 MCG/ACT inhaler Inhale 2 puffs into the lungs every 4 (four) hours as needed for wheezing.       . Azelastine HCl (ASTEPRO) 0.15 % SOLN Place 1 spray into the nose 2 (two) times daily as needed (allergic rhinitis).       Marland Kitchen azithromycin (ZITHROMAX) 250 MG tablet Take 2 tablets by mouth on day one, then take 1 tablet daily for the next 4 days  6 tablet  0  . budesonide-formoterol (SYMBICORT) 160-4.5 MCG/ACT inhaler Inhale 1 puff into the lungs 2 (two) times daily.   1 Inhaler    . calcium-vitamin D (CALCIUM + D) 250-125 MG-UNIT per tablet Take 2 tablets by mouth 2 (two) times daily. 1 pm & 5 pm.      . carboxymethylcellulose (REFRESH TEARS) 0.5 % SOLN Place 1 drop into both eyes 4 (four) times daily as needed (for dry eyes).      . cycloSPORINE modified 25 MG CAPS Take 275 mg by mouth every 12 (twelve) hours.  540 capsule    . divalproex (DEPAKOTE) 250 MG DR tablet Take 5 tablets (1,250 mg total) by mouth at  bedtime. Take 1 tablet in the morning, 1 tablet at noon, and 2 tablets at bedtime  150 tablet  3  . magnesium oxide (MAG-OX) 400 MG tablet Take 400 mg by mouth 2 (two) times daily. 1 pm & 5 pm.      . montelukast (SINGULAIR) 10 MG tablet Take 10 mg by mouth daily. 9 am.  30 tablet  12  . Multiple Vitamin (MULTIVITAMIN) tablet Take 1 tablet by mouth daily. 9 am.      . mycophenolate (CELLCEPT) 250 MG capsule Take 1,000 mg by mouth 2 (two) times daily. 9 am & 9 pm.       . omeprazole (PRILOSEC) 40 MG capsule Take 40 mg by mouth daily. 9 am.      . oxycodone (OXY-IR) 5 MG capsule Take 5-10 mg by mouth every 4 (four) hours as needed for pain.      . polyethylene glycol (MIRALAX / GLYCOLAX) packet Take 17 g by mouth daily as needed (for constipation). In water or juice      . predniSONE (DELTASONE) 5 MG tablet Take 5 mg by mouth daily. At 9am.      . senna-docusate (SENOKOT-S) 8.6-50 MG per tablet Take 2 tablets by mouth 2 (two) times daily.       . tacrolimus (PROGRAF) 1 MG capsule Take 3-4 mg by mouth 2 (two) times daily. 4 mg every morning (9 am) and 3 mg (9 pm) every evening.      . valGANciclovir (VALCYTE) 450 MG tablet Take 2 tablets (900 mg total) by mouth every evening. At 1700       No current facility-administered medications for this visit.    Objective: BP 126/81  Pulse 87  Temp(Src) 98.2 F (36.8 C) (Oral)  Ht 5\' 1"  (1.549 m)  Wt 162 lb 8 oz (73.71 kg)  BMI 30.72 kg/m2 Gen: Pleasant AFF that appears her stated age. No acute distress. Alert, cooperative with exam HEENT: Atraumatic, EOMI, PERRLA, Oropharynx clear. MMM CV: RRR. III/VI systolic murmur, no rubs or gallops noted. 2+ radial and DP pulses bilaterally. Resp: CTAB. No wheezing, crackles, or rhonchi noted. Abd: +BS. Soft, non-distended, non-tender. No rebound or guarding. Well healed incision sites on her abdomen from her previous transplant. Ext: No edema. No gross deformities. Neuro: Speech slow, however coherent and appropriate. Alert and oriented, No gross focal deficits   Assessment/Plan:  Seizure disorder Patient stable on Depakote. Scheduled to f/u with Dr. Krista Woodward next month.   Schizophrenia, paranoid type From what the patient, this is stable. Currently denying AVH, SI, or HI. Does not appear internally distracted.  From chart review, she has numerous ED visits due to agitation and psychosis in the past. Continue Risperdal.   H/O liver transplant Patient with good  follow up at [facility]. Cellcept decreased by transplant team as her WBC was 2.0.  She continues to take Prograft, cyclosporin, and valganciclovir  Denies signs of infection give immunocompromised state.    Health care maintenance -Influenza vaccine given today  - Given patient is immunocompromised, she will need cervical cancer screening annually. Last one was 03/2014, therefore she'll f/u in 1 month

## 2014-03-06 NOTE — Assessment & Plan Note (Signed)
From what the patient, this is stable. Currently denying AVH, SI, or HI. Does not appear internally distracted.  From chart review, she has numerous ED visits due to agitation and psychosis in the past. Continue Risperdal.

## 2014-03-06 NOTE — Assessment & Plan Note (Signed)
-  Influenza vaccine given today  - Given patient is immunocompromised, she will need cervical cancer screening annually. Last one was 03/2014, therefore she'll f/u in 1 month

## 2014-03-06 NOTE — Assessment & Plan Note (Signed)
Patient stable on Depakote. Scheduled to f/u with Dr. [name] next month.

## 2014-03-09 ENCOUNTER — Telehealth: Payer: Self-pay | Admitting: Neurology

## 2014-03-09 ENCOUNTER — Other Ambulatory Visit (INDEPENDENT_AMBULATORY_CARE_PROVIDER_SITE_OTHER): Payer: Medicaid Other

## 2014-03-09 DIAGNOSIS — R569 Unspecified convulsions: Secondary | ICD-10-CM

## 2014-03-09 DIAGNOSIS — Z0289 Encounter for other administrative examinations: Secondary | ICD-10-CM

## 2014-03-09 NOTE — Telephone Encounter (Signed)
Stat lab call for neurtrophil was low 0.4,   Chart reviewed, most recent lab was 02/23/2014, wbc 2.0.

## 2014-03-10 LAB — CBC WITH DIFFERENTIAL
Basophils Absolute: 0 10*3/uL (ref 0.0–0.2)
Basos: 1 %
EOS: 10 %
Eosinophils Absolute: 0.2 10*3/uL (ref 0.0–0.4)
HEMATOCRIT: 24.6 % — AB (ref 34.0–46.6)
Hemoglobin: 8.7 g/dL — ABNORMAL LOW (ref 11.1–15.9)
LYMPHS ABS: 1 10*3/uL (ref 0.7–3.1)
Lymphs: 64 %
MCH: 27.8 pg (ref 26.6–33.0)
MCHC: 35.4 g/dL (ref 31.5–35.7)
MCV: 79 fL (ref 79–97)
Monocytes Absolute: 0 10*3/uL — ABNORMAL LOW (ref 0.1–0.9)
Monocytes: 1 %
Neutrophils Absolute: 0.4 10*3/uL — CL (ref 1.4–7.0)
Neutrophils Relative %: 24 %
PLATELETS: 105 10*3/uL — AB (ref 150–379)
RBC: 3.13 x10E6/uL — AB (ref 3.77–5.28)
RDW: 17.6 % — ABNORMAL HIGH (ref 12.3–15.4)
WBC: 1.6 10*3/uL — AB (ref 3.4–10.8)

## 2014-03-11 NOTE — Telephone Encounter (Addendum)
Liver transplant physician, I have called her abnormal cbc, with low wbc, she is aware of that, is under close monitoring of [name] team,  I have tried to contact them at  [place],  And also  [place] [phone] for low WBC, she is aware of that, repeat lab Oct 14th WBC was 1.5,  Dr. Merton Border will do medications adjustment

## 2014-03-23 ENCOUNTER — Ambulatory Visit (INDEPENDENT_AMBULATORY_CARE_PROVIDER_SITE_OTHER): Payer: Medicaid Other | Admitting: Neurology

## 2014-03-23 ENCOUNTER — Telehealth: Payer: Self-pay | Admitting: Neurology

## 2014-03-23 ENCOUNTER — Encounter: Payer: Self-pay | Admitting: Neurology

## 2014-03-23 VITALS — BP 120/73 | HR 61 | Ht 61.0 in | Wt 160.0 lb

## 2014-03-23 DIAGNOSIS — F79 Unspecified intellectual disabilities: Secondary | ICD-10-CM

## 2014-03-23 DIAGNOSIS — G40909 Epilepsy, unspecified, not intractable, without status epilepticus: Secondary | ICD-10-CM

## 2014-03-23 MED ORDER — DIVALPROEX SODIUM 250 MG PO DR TAB: 1250.0000 mg | DELAYED_RELEASE_TABLET | Freq: Every day | ORAL | Status: DC

## 2014-03-23 NOTE — Telephone Encounter (Signed)
[name], Pharmacy Tech with [facility] @ [phone], needing clarification on instructions for Rx divalproex (DEPAKOTE) 250 MG DR tablet.  Please call and advise.

## 2014-03-23 NOTE — Progress Notes (Signed)
[facility]  PATIENT: [name] DOB: January 24, 1976    HISTORY OF PRESENT ILLNESS:[name], 38 year old black female returns today for followup of epilepsy.  [name]  She has a history of mild mental retardation, and long-standing history of seizures. She was  well-controlled on Depakote and Tegretol extended release.  But she has developed liver disease, was diagnosed with primary biliary cirrhosis,  Antieptical medication was changed since February 2013, she is now taking Vimpat 100 mg 2 tabs twice a day, Keppra 500 mg 3 tabs daily. She has no significant seizure activities while taking Vimpat, and Keppra.  She lives alone, uncles lives next door, she attends adult daycare regularly.  She underwent liver transplantation in February 2015, doing very well, on polypharmacy treatment, this including CellCept 1000 mg twice a day, Prograft 4 mg twice a day,  For her seizure, she is now taking deapkote ER 250mg  5 tabs qhs, she is having seizure about once a month, it usually happens at nighttime, she was noted to have nighttime extremity jumping movement, tongue biting when she woke up, she also complains of mild gait difficullty,   UPDATE Oct 27th 2015: I have talked with [name] liver transplant cordinator, reported, 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, at [place], Keppra, Vimpat cause worsening behavior issue, she had a hallucinations, paranoid to the point of almost institutionalized, her mood disorder eventually was under control with Depakote ER 250 mg 5 tablets each day,   [name] continues to complains that she had a 2 episode of working up with blood in her mouth, has true to her tongue, one in July, the second one was September 2015, overall she is tolerating the medication well, Depakote level was 63 in February 23 2014, EEG was normal.  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 Tramadol Hcl Itching and Nausea And Vomiting    Causes SEIZURES  . Aspirin Nausea And Vomiting    Due to Stomach ulcer  . Penicillins Itching, Swelling and Rash       . Pseudoephedrine Itching and Swelling    Tongue swelling  . Sulfa Antibiotics Nausea And Vomiting  . Latex Itching and Rash    HOME MEDICATIONS: Outpatient Prescriptions Prior to Visit  Medication Sig Dispense Refill  . albuterol (PROVENTIL,VENTOLIN) 90 MCG/ACT inhaler Inhale 2 puffs into the lungs every 4 (four) hours as needed for wheezing.       . Azelastine HCl (ASTEPRO) 0.15 % SOLN Place 1 spray into the nose 2 (two) times daily as needed (allergic rhinitis).       Marland Kitchen azithromycin (ZITHROMAX) 250 MG tablet Take 2 tablets by mouth on day one, then take 1 tablet daily for the next 4 days  6 tablet  0  . budesonide-formoterol (SYMBICORT) 160-4.5 MCG/ACT inhaler Inhale 1 puff into the lungs 2 (two) times daily.   1 Inhaler    . calcium-vitamin D (CALCIUM + D) 250-125 MG-UNIT per tablet Take 2 tablets by mouth 2 (two) times daily. 1  pm & 5 pm.      . carboxymethylcellulose (REFRESH TEARS) 0.5 % SOLN Place 1 drop into both eyes 4 (four) times daily as needed (for dry eyes).      . cycloSPORINE modified 25 MG CAPS Take 275 mg by mouth every 12 (twelve) hours.  540 capsule    . divalproex (DEPAKOTE) 250 MG DR tablet Take 5 tablets (1,250 mg total) by mouth at bedtime. Take 1 tablet in the morning, 1 tablet at noon, and 2 tablets at bedtime  150 tablet  3  . magnesium oxide (MAG-OX) 400 MG tablet Take 400 mg by mouth  2 (two) times daily. 1 pm & 5 pm.      . montelukast (SINGULAIR) 10 MG tablet Take 10 mg by mouth daily. 9 am.  30 tablet  12  . Multiple Vitamin (MULTIVITAMIN) tablet Take 1 tablet by mouth daily. 9 am.      . mycophenolate (CELLCEPT) 250 MG capsule Take 1,000 mg by mouth 2 (two) times daily. 9 am & 9 pm.      . omeprazole (PRILOSEC) 40 MG capsule Take 40 mg by mouth daily. 9 am.      . oxycodone (OXY-IR) 5 MG capsule Take 5-10 mg by mouth every 4 (four) hours as needed for pain.      . polyethylene glycol (MIRALAX / GLYCOLAX) packet Take 17 g by mouth daily as needed (for constipation). In water or juice      . predniSONE (DELTASONE) 5 MG tablet Take 5 mg by mouth daily. At 9am.      . senna-docusate (SENOKOT-S) 8.6-50 MG per tablet Take 2 tablets by mouth 2 (two) times daily.       . tacrolimus (PROGRAF) 1 MG capsule Take 3-4 mg by mouth 2 (two) times daily. 4 mg every morning (9 am) and 3 mg (9 pm) every evening.      . valGANciclovir (VALCYTE) 450 MG tablet Take 2 tablets (900 mg total) by mouth every evening. At 1700       No facility-administered medications prior to visit.    PAST MEDICAL HISTORY: Past Medical History  Diagnosis Date  . Allergy   . Anemia   . Intellectual disability   . Constipation   . Abnormal vaginal bleeding   . Biliary cirrhosis 04/26/11  . 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 DUB (dysfunctional uterine bleeding)   . PONV (postoperative nausea and vomiting)   . Seizures     congenital epilepsy-hx. seizures  . Asthma 09-23-12    Asthma somewhat causing increase wheezing and mild congestion at present  . GERD (gastroesophageal reflux disease)   . Arthritis     right knee  . Vitamin D deficiency     PAST SURGICAL HISTORY: Past Surgical History  Procedure Laterality Date  . Appendectomy    . Cholecystectomy    . Eye muscle surgery    . Eustacean tubes    . Colonoscopy w/ biopsies    . Adenonoidectomy    . Esophagogastroduodenoscopy (egd) with  propofol N/A 10/01/2012    Procedure: ESOPHAGOGASTRODUODENOSCOPY (EGD) WITH PROPOFOL;  Surgeon: Arta Silence, MD;  Location: [facility];  Service: Endoscopy;  Laterality: N/A;  . Liver transplant      FAMILY HISTORY: Family History  Problem Relation Age of Onset  . Diabetes Mother   . Heart disease Mother   . COPD Mother     SOCIAL HISTORY: History   Social History  . Marital Status: Single  Spouse Name: N/A    Number of Children: N/A  . Years of Education: N/A   Occupational History  . Not on file.   Social History Main Topics  . Smoking status: Never Smoker   . Smokeless tobacco: Never Used  . Alcohol Use: No  . Drug Use: No  . Sexual Activity: No   Other Topics Concern  . Not on file   Social History Narrative  Patient does not work, she has a 12th grade education, she is single lives alone  PHYSICAL EXAM  Filed Vitals:   03/23/14 1335  BP: 120/73  Pulse: 61  Height: 5\' 1"  (1.549 m)  Weight: 160 lb (72.576 kg)   Body mass index is 30.25 kg/(m^2).  Generalized: Well developed, in no acute distress  Head: normocephalic and atraumatic,. Oropharynx benign  Neck: Supple, no carotid bruits  Cardiac: Regular rate rhythm, no murmur  Musculoskeletal: No deformity   Neurological examination   Mentation: Alert oriented to time, place, history taking. Follows all commands, hesitant with speech mild MR  Cranial nerve II-XII:Pupils were equal round reactive to light extraocular movements were full, visual field were full on confrontational test. She has mild right lower face weakness. hearing was intact to finger rubbing bilaterally. Uvula tongue midline. head turning and shoulder shrug and were normal and symmetric. Tongue protrusion to the right. Motor: normal bulk and tone, full strength in the BUE, BLE, fine finger movements normal, no pronator drift. No focal weakness Coordination: finger-nose-finger, heel-to-shin bilaterally, no dysmetria Reflexes:  Depressed and symmetric Gait and Station: Rising up from seated position without assistance, normal stance,  moderate stride, holds right arm and right elbow in flexion, smooth turning, able to perform tiptoe, and heel walking without difficulty.   DIAGNOSTIC DATA (LABS, IMAGING, TESTING) - I reviewed patient records, labs, notes, testing and imaging myself where available.  Lab Results  Component Value Date   WBC 1.6* 03/09/2014   HGB 8.7* 03/09/2014   HCT 24.6* 03/09/2014   MCV 79 03/09/2014   PLT 105* 03/09/2014      Component Value Date/Time   NA 139 02/23/2014 1644   NA 143 10/15/2013 1216   K 4.9 02/23/2014 1644   CL 102 02/23/2014 1644   CO2 22 02/23/2014 1644   GLUCOSE 95 02/23/2014 1644   GLUCOSE 116* 10/15/2013 1216   BUN 24* 02/23/2014 1644   BUN 12 10/15/2013 1216   CREATININE 1.00 02/23/2014 1644   CREATININE 0.62 05/19/2013 1514   CALCIUM 8.4* 02/23/2014 1644   CALCIUM 9.0 09/11/2007 2301   PROT 5.9* 02/23/2014 1644   PROT 6.8 10/15/2013 1216   ALBUMIN 4.1 10/15/2013 1216   AST 49* 02/23/2014 1644   ALT 38* 02/23/2014 1644   ALKPHOS 80 02/23/2014 1644   BILITOT 0.4 02/23/2014 1644   GFRNONAA 71 02/23/2014 1644   GFRNONAA >89 11/09/2011 1208   GFRAA 82 02/23/2014 1644   GFRAA >89 11/09/2011 1208      Lab Results  Component Value Date   TSH 2.548 01/14/2013      ASSESSMENT AND PLAN  38 y.o. year old female  has a past medical history of Anemia; Intellectual disability, primary biliary cirrhosis, s/p liver transplant in Feb 2015,  she is now taking Depakote ER 250 mg 5 tablets every night, possible seizure event in July, September 2015,.  1. Keep current dose of Depakote ER 250 mg 5 tablets every night 2, status post liver transplant, suppressive treatment, followed up closely by [name] transplant  team, 3, document all events, return to clinic in 3 months with NP   Marcial Pacas, M.D. Ph.D.  [facility] [address], [place], [place]  [address] [phone]

## 2014-03-23 NOTE — Telephone Encounter (Signed)
OV note says: 1. Keep current dose of Depakote 250 mg 5 tablets every night Rx has been resent.  I called the pharmacy back.  Pharmacist is aware.

## 2014-03-31 ENCOUNTER — Emergency Department (HOSPITAL_COMMUNITY)
Admission: EM | Admit: 2014-03-31 | Discharge: 2014-03-31 | Disposition: A | Discharge status: Home / Self Care | Payer: Medicaid Other | Attending: Emergency Medicine | Admitting: Emergency Medicine

## 2014-03-31 ENCOUNTER — Encounter (HOSPITAL_COMMUNITY): Payer: Self-pay | Admitting: Emergency Medicine

## 2014-03-31 ENCOUNTER — Emergency Department (HOSPITAL_COMMUNITY): Payer: Medicaid Other

## 2014-03-31 DIAGNOSIS — W06XXXA Fall from bed, initial encounter: Secondary | ICD-10-CM | POA: Diagnosis not present

## 2014-03-31 DIAGNOSIS — Z880 Allergy status to penicillin: Secondary | ICD-10-CM | POA: Diagnosis not present

## 2014-03-31 DIAGNOSIS — K219 Gastro-esophageal reflux disease without esophagitis: Secondary | ICD-10-CM | POA: Insufficient documentation

## 2014-03-31 DIAGNOSIS — S0990XA Unspecified injury of head, initial encounter: Secondary | ICD-10-CM | POA: Diagnosis present

## 2014-03-31 DIAGNOSIS — Z79899 Other long term (current) drug therapy: Secondary | ICD-10-CM | POA: Diagnosis not present

## 2014-03-31 DIAGNOSIS — Y92009 Unspecified place in unspecified non-institutional (private) residence as the place of occurrence of the external cause: Secondary | ICD-10-CM | POA: Insufficient documentation

## 2014-03-31 DIAGNOSIS — M1990 Unspecified osteoarthritis, unspecified site: Secondary | ICD-10-CM | POA: Diagnosis not present

## 2014-03-31 DIAGNOSIS — W19XXXA Unspecified fall, initial encounter: Secondary | ICD-10-CM

## 2014-03-31 DIAGNOSIS — Z862 Personal history of diseases of the blood and blood-forming organs and certain disorders involving the immune mechanism: Secondary | ICD-10-CM | POA: Diagnosis not present

## 2014-03-31 DIAGNOSIS — Y9389 Activity, other specified: Secondary | ICD-10-CM | POA: Diagnosis not present

## 2014-03-31 DIAGNOSIS — J45909 Unspecified asthma, uncomplicated: Secondary | ICD-10-CM | POA: Diagnosis not present

## 2014-03-31 DIAGNOSIS — S0093XA Contusion of unspecified part of head, initial encounter: Secondary | ICD-10-CM | POA: Diagnosis not present

## 2014-03-31 DIAGNOSIS — Z91040 Latex allergy status: Secondary | ICD-10-CM | POA: Insufficient documentation

## 2014-03-31 DIAGNOSIS — S7001XA Contusion of right hip, initial encounter: Secondary | ICD-10-CM | POA: Diagnosis not present

## 2014-03-31 DIAGNOSIS — Z8639 Personal history of other endocrine, nutritional and metabolic disease: Secondary | ICD-10-CM | POA: Insufficient documentation

## 2014-03-31 MED ORDER — ACETAMINOPHEN 325 MG PO TABS
650.0000 mg | ORAL_TABLET | Freq: Once | ORAL | Status: AC
  Administered 2014-03-31: 650 mg via ORAL
  Filled 2014-03-31: qty 2

## 2014-03-31 NOTE — ED Notes (Signed)
Bed: WA03 Expected date:  Expected time:  Means of arrival:  Comments: EMS 

## 2014-03-31 NOTE — ED Provider Notes (Signed)
CSN: [id number]     Arrival date & time 03/31/14  1152 History   First MD Initiated Contact with Patient 03/31/14 1153     Chief Complaint  Patient presents with  . Fall  . Hip Pain    right  . hematoma to head     HPI Comments: Ms. Heckmann presents for evaluation of injuries after fall. She was on her bed today changing a lightbulb when she bounced off the bed and fell to the ground. She hit her head and her right hip. Denies LOC, headache, nausea, vomiting. She reports pain in her right hip with range of motion and ambulation. She is able to ambulate. Patient denies history of head injury or head bleed and does not take any anticoagulants. She does have a history of epilepsy and has a liver transplant. She lives alone but has an uncle next door that assists in her care.    Patient is a 38 y.o. female presenting with fall and hip pain. The history is provided by the patient.  Fall This is a new problem. The current episode started less than 1 hour ago. The problem has been gradually improving. Pertinent negatives include no abdominal pain and no headaches. Nothing aggravates the symptoms. Nothing relieves the symptoms. She has tried nothing for the symptoms.  Hip Pain This is a new problem. The current episode started less than 1 hour ago. Pertinent negatives include no abdominal pain and no headaches. The symptoms are aggravated by walking. She has tried nothing for the symptoms.    Past Medical History  Diagnosis Date  . Allergy   . Anemia   . Intellectual disability   . Constipation   . Abnormal vaginal bleeding   . Biliary cirrhosis 04/26/11  . 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 DUB (dysfunctional uterine bleeding)   . PONV (postoperative nausea and vomiting)   . Seizures     congenital epilepsy-hx. seizures  . Asthma 09-23-12    Asthma somewhat causing increase wheezing and mild congestion at present  . GERD (gastroesophageal reflux disease)   . Arthritis     right knee   . Vitamin D deficiency    Past Surgical History  Procedure Laterality Date  . Appendectomy    . Cholecystectomy    . Eye muscle surgery    . Eustacean tubes    . Colonoscopy w/ biopsies    . Adenonoidectomy    . Esophagogastroduodenoscopy (egd) with propofol N/A 10/01/2012    Procedure: ESOPHAGOGASTRODUODENOSCOPY (EGD) WITH PROPOFOL;  Surgeon: Arta Silence, MD;  Location: [facility];  Service: Endoscopy;  Laterality: N/A;  . Liver transplant     Family History  Problem Relation Age of Onset  . Diabetes Mother   . Heart disease Mother   . COPD Mother    History  Substance Use Topics  . Smoking status: Never Smoker   . Smokeless tobacco: Never Used  . Alcohol Use: No   OB History    No data available     Review of Systems  Gastrointestinal: Negative for nausea, vomiting and abdominal pain.  Musculoskeletal: Negative for back pain and neck pain.  Neurological: Negative for dizziness, light-headedness and headaches.  All other systems reviewed and are negative.     Allergies  Tramadol hcl; Aspirin; Penicillins; Pseudoephedrine; Sulfa antibiotics; and Latex  Home Medications   Prior to Admission medications   Medication Sig Start Date End Date Taking? Authorizing Provider  albuterol (PROVENTIL,VENTOLIN) 90 MCG/ACT inhaler Inhale 2 puffs into  the lungs every 4 (four) hours as needed for wheezing.     Historical Provider, MD  Azelastine HCl (ASTEPRO) 0.15 % SOLN Place 1 spray into the nose 2 (two) times daily as needed (allergic rhinitis).     Historical Provider, MD  azithromycin (ZITHROMAX) 250 MG tablet Take 2 tablets by mouth on day one, then take 1 tablet daily for the next 4 days 02/12/14   Willeen Niece, MD  budesonide-formoterol [facility]) 160-4.5 MCG/ACT inhaler Inhale 1 puff into the lungs 2 (two) times daily.  10/12/13   Waylan Boga, NP  calcium-vitamin D (CALCIUM + D) 250-125 MG-UNIT per tablet Take 2 tablets by mouth 2 (two) times daily. 1 pm & 5 pm.  10/12/13   Waylan Boga, NP  carboxymethylcellulose (REFRESH TEARS) 0.5 % SOLN Place 1 drop into both eyes 4 (four) times daily as needed (for dry eyes).    Historical Provider, MD  cycloSPORINE modified 25 MG CAPS Take 200 mg by mouth every 12 (twelve) hours. 02/12/14   Willeen Niece, MD  divalproex (DEPAKOTE) 250 MG DR tablet Take 5 tablets (1,250 mg total) by mouth at bedtime. 03/23/14   Marcial Pacas, MD  magnesium oxide (MAG-OX) 400 MG tablet Take 400 mg by mouth 2 (two) times daily. 1 pm & 5 pm. 10/12/13   Waylan Boga, NP  Melatonin 3 MG CAPS Take 3 mg by mouth at bedtime.    Historical Provider, MD  montelukast (SINGULAIR) 10 MG tablet Take 10 mg by mouth daily. 9 am. 10/12/13   Waylan Boga, NP  Multiple Vitamin (MULTIVITAMIN) tablet Take 1 tablet by mouth daily. 9 am. 10/12/13   Waylan Boga, NP  mycophenolate (CELLCEPT) 250 MG capsule Take 250 mg by mouth 2 (two) times daily. 9 am & 9 pm. 10/12/13   Waylan Boga, NP  omeprazole (PRILOSEC) 40 MG capsule Take 40 mg by mouth daily. 9 am. 10/12/13   Waylan Boga, NP  oxycodone (OXY-IR) 5 MG capsule Take 5-10 mg by mouth every 4 (four) hours as needed for pain.    Historical Provider, MD  polyethylene glycol (MIRALAX / GLYCOLAX) packet Take 17 g by mouth daily as needed (for constipation). In water or juice    Historical Provider, MD  senna-docusate (SENOKOT-S) 8.6-50 MG per tablet Take 2 tablets by mouth 2 (two) times daily.  10/12/13   Waylan Boga, NP  valGANciclovir (VALCYTE) 450 MG tablet Take 2 tablets (900 mg total) by mouth every evening. At 1700 10/12/13   Waylan Boga, NP   BP 108/89 mmHg  Pulse 75  Temp(Src) 98.3 F (36.8 C) (Oral)  Resp 20  SpO2 100% Physical Exam  Constitutional: She is oriented to person, place, and time. She appears well-developed and well-nourished.  No acute distress  HENT:  Head: Normocephalic.  Mild tenderness and swelling to the right posterior scalp. No abrasions.  Eyes: EOM are normal. Pupils are equal,  round, and reactive to light.  Neck: Normal range of motion. Neck supple.  No cervical tenderness  Cardiovascular: Normal rate, regular rhythm and normal heart sounds.   No murmur heard. Pulmonary/Chest: Effort normal and breath sounds normal. No respiratory distress.  Abdominal: Soft. There is no tenderness. There is no guarding.  Musculoskeletal: She exhibits no edema.  Mild tenderness to palpation over right lateral hip with preserved range of motion. Normal gait. No T or L-spine tenderness.  Neurological: She is alert and oriented to person, place, and time.  Moves all extremities symmetrically  Skin: Skin  is warm and dry.  Psychiatric: She has a normal mood and affect.  Nursing note and vitals reviewed.   ED Course  Procedures (including critical care time) Labs Review Labs Reviewed - No data to display  Imaging Review Dg Hip Complete Right  03/31/2014   CLINICAL DATA:  [place] off of bed, landing on right hip. Right hip pain.  EXAM: RIGHT HIP - COMPLETE 2+ VIEW  COMPARISON:  08/23/2012  FINDINGS: No acute bony abnormality. Specifically, no fracture, subluxation, or dislocation. Soft tissues are intact. Hip joints and SI joints are symmetric and unremarkable.  IMPRESSION: Negative.   Electronically Signed   By: Rolm Baptise M.D.   On: 03/31/2014 12:59     EKG Interpretation None      Discussed with patient's uncle, Lougenia Morrissey, her presentation and evaluation.  Plan to discharge home  MDM   Final diagnoses:  Fall at home, initial encounter  Head contusion, initial encounter  Contusion, hip, right, initial encounter    Patient presents for further injuries following fall.  Patient at baseline mental status no history of LOC without concussive symptoms.  Patient is low risk for serious head injury do not feel CT is warranted at this time.  Plain films of hip negative for fracture or significant expanding hematoma on exam. Discussed home care and return for  contusions.    Quintella Reichert, MD 03/31/14 [phone]

## 2014-03-31 NOTE — ED Notes (Signed)
Per EMS pt comes from home where she was standing on her bed trying to change a light bulb when the bed bounced and she fell off bed on to floor onto right hip.  Per EMS pt c/o right hip pain but was ambulatory at scene as well as having a hematoma to right posterior head.  Pt denies LOC or being on anticoagulants.

## 2014-03-31 NOTE — Discharge Instructions (Signed)
Contusion °A contusion is a deep bruise. Contusions are the result of an injury that caused bleeding under the skin. The contusion may turn blue, purple, or yellow. Minor injuries will give you a painless contusion, but more severe contusions may stay painful and swollen for a few weeks.  °CAUSES  °A contusion is usually caused by a blow, trauma, or direct force to an area of the body. °SYMPTOMS  °· Swelling and redness of the injured area. °· Bruising of the injured area. °· Tenderness and soreness of the injured area. °· Pain. °DIAGNOSIS  °The diagnosis can be made by taking a history and physical exam. An X-ray, CT scan, or MRI may be needed to determine if there were any associated injuries, such as fractures. °TREATMENT  °Specific treatment will depend on what area of the body was injured. In general, the best treatment for a contusion is resting, icing, elevating, and applying cold compresses to the injured area. Over-the-counter medicines may also be recommended for pain control. Ask your caregiver what the best treatment is for your contusion. °HOME CARE INSTRUCTIONS  °· Put ice on the injured area. °¨ Put ice in a plastic bag. °¨ Place a towel between your skin and the bag. °¨ Leave the ice on for 15-20 minutes, 3-4 times a day, or as directed by your health care provider. °· Only take over-the-counter or prescription medicines for pain, discomfort, or fever as directed by your caregiver. Your caregiver may recommend avoiding anti-inflammatory medicines (aspirin, ibuprofen, and naproxen) for 48 hours because these medicines may increase bruising. °· Rest the injured area. °· If possible, elevate the injured area to reduce swelling. °SEEK IMMEDIATE MEDICAL CARE IF:  °· You have increased bruising or swelling. °· You have pain that is getting worse. °· Your swelling or pain is not relieved with medicines. °MAKE SURE YOU:  °· Understand these instructions. °· Will watch your condition. °· Will get help right  away if you are not doing well or get worse. °Document Released: 02/21/2005 Document Revised: 05/19/2013 Document Reviewed: 03/19/2011 °[employer]® Patient Information ©2015 [employer]. This information is not intended to replace advice given to you by your health care provider. Make sure you discuss any questions you have with your health care provider. ° °

## 2014-04-01 ENCOUNTER — Telehealth: Payer: Self-pay | Admitting: Family Medicine

## 2014-04-01 NOTE — Telephone Encounter (Signed)
Took a fall yesterday.  Has a big knot on her head and a bruise on her hip. Went to ED. Was told to take tylenol. Wants to know how often to take it Please advise

## 2014-04-02 NOTE — Telephone Encounter (Signed)
Pt informed to take 2 tablets every 4 to 6 hours. Blount, Deseree CMA

## 2014-04-06 ENCOUNTER — Ambulatory Visit (INDEPENDENT_AMBULATORY_CARE_PROVIDER_SITE_OTHER): Payer: Medicaid Other | Admitting: Family Medicine

## 2014-04-06 ENCOUNTER — Encounter: Payer: Self-pay | Admitting: Family Medicine

## 2014-04-06 VITALS — BP 124/79 | HR 109 | Temp 98.2°F | Ht 61.0 in | Wt 165.9 lb

## 2014-04-06 DIAGNOSIS — M545 Low back pain, unspecified: Secondary | ICD-10-CM | POA: Insufficient documentation

## 2014-04-06 HISTORY — DX: Low back pain, unspecified: M54.50

## 2014-04-06 NOTE — Assessment & Plan Note (Signed)
Patient's pain most likely due to her fall on Wednesday. No red flags on exam: no loss of bowel/bladder control, no paraesthesia, no radicular symptoms. Additionally, her right sided chest pain is also due to the fall- pain is worsened with palpation, very localized, and is not associated with diaphoresis or SOB.  - No need for imaging or further work-up currently  - Re-assured the patient that this pain will resolve, it will simply take time - Showed her back exercises, such as back arches - Heat and ice as needed

## 2014-04-06 NOTE — Progress Notes (Signed)
[facility]  [name], MD  Subjective:  Chief complaint: back pain, chest pian  Patient presents for an ED follow-up. On Wednesday, she was standing on her bed to change a light bulb when she fell off, hitting the right side of her head and her hip. She did not have LOC, headache, or vision change and it was not felt she required head imaging. Imaging of her R hip was normal. She was sent home with advice to take Tylenol 2 tablets TID. She does not feel like this helped. Additionally, on Thursday, shote noted her back started hurting, mostly the right lumbar region. On Friday, she had sudden onset "chest pain" while watching TV.  She shows that the pain is mostly in upper right side, near her axilla and sometimes it radiates medially. The pain is worse with deep inspiration. If she moves her arm in one particular way (has her elbow in towards her body and moves her shoulder up superiorly) she has pain. She does not recall any new trauma, but then recalls attempting to catch herself with her right arm when she was falling.   ROS- No urinary or bowel incontinence. No radicular pain. No swelling. Lower back pain is not worse with bending over, however is worse when she twists at the hips to the left. No diaphoresis.  Past Medical History Patient Active Problem List   Diagnosis Date Noted  . Lumbar back pain 04/06/2014  . Health care maintenance 03/06/2014  . Acute bronchitis, bacterial 02/12/2014  . H/O liver transplant 11/23/2013  . Thrombocytopenia 03/09/2013  . Seizure disorder 03/01/2013  . Schizophrenia, paranoid type 06/25/2012    Class: Chronic  . 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 Vision loss, bilateral 03/30/2012  . 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 Menorrhagia 12/22/2010  . Unspecified vitamin D deficiency 09/13/2010  . HEMATURIA UNSPECIFIED 06/14/2010  . RECTAL BLEEDING, HX OF 06/14/2010  . ANEMIA 03/03/2010  . GERD 07/21/2008  . Congenital pes  planus 01/26/2008  . CARDIAC FLOW MURMUR 10/16/2007  . EPILEPSY 08/23/2006  . ALLERGIC RHINITIS 07/30/2006  . OBESITY, NOS 07/25/2006  . 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 Mental retardation 07/25/2006  . ASTHMA, PERSISTENT 07/25/2006  . OSTEOARTHRITIS, LOWER LEG 07/25/2006    Medications- reviewed and updated Current Outpatient Prescriptions  Medication Sig Dispense Refill  . albuterol (PROVENTIL,VENTOLIN) 90 MCG/ACT inhaler Inhale 2 puffs into the lungs every 4 (four) hours as needed for wheezing.     . Azelastine HCl (ASTEPRO) 0.15 % SOLN Place 1 spray into the nose 2 (two) times daily as needed (allergic rhinitis).     Marland Kitchen azithromycin (ZITHROMAX) 250 MG tablet Take 2 tablets by mouth on day one, then take 1 tablet daily for the next 4 days 6 tablet 0  . budesonide-formoterol (SYMBICORT) 160-4.5 MCG/ACT inhaler Inhale 1 puff into the lungs 2 (two) times daily.  1 Inhaler   . calcium-vitamin D (CALCIUM + D) 250-125 MG-UNIT per tablet Take 2 tablets by mouth 2 (two) times daily. 1 pm & 5 pm.    . carboxymethylcellulose (REFRESH TEARS) 0.5 % SOLN Place 1 drop into both eyes 4 (four) times daily as needed (for dry eyes).    . cycloSPORINE modified 25 MG CAPS Take 200 mg by mouth every 12 (twelve) hours.    . divalproex (DEPAKOTE) 250 MG DR tablet Take 5 tablets (1,250 mg total) by mouth at bedtime. 150 tablet 11  . magnesium oxide (MAG-OX) 400 MG tablet Take 400 mg by  mouth 2 (two) times daily. 1 pm & 5 pm.    . Melatonin 3 MG CAPS Take 3 mg by mouth at bedtime.    . montelukast (SINGULAIR) 10 MG tablet Take 10 mg by mouth daily. 9 am. 30 tablet 12  . Multiple Vitamin (MULTIVITAMIN) tablet Take 1 tablet by mouth daily. 9 am.    . mycophenolate (CELLCEPT) 250 MG capsule Take 250 mg by mouth 2 (two) times daily. 9 am & 9 pm.    . omeprazole (PRILOSEC) 40 MG capsule Take 40 mg by mouth daily. 9 am.    . oxycodone (OXY-IR) 5 MG capsule Take 5-10 mg by mouth every 4 (four) hours as needed for  pain.    . polyethylene glycol (MIRALAX / GLYCOLAX) packet Take 17 g by mouth daily as needed (for constipation). In water or juice    . senna-docusate (SENOKOT-S) 8.6-50 MG per tablet Take 2 tablets by mouth 2 (two) times daily.     . valGANciclovir (VALCYTE) 450 MG tablet Take 2 tablets (900 mg total) by mouth every evening. At 1700     No current facility-administered medications for this visit.    Objective: BP 124/79 mmHg  Pulse 109  Temp(Src) 98.2 F (36.8 C) (Oral)  Ht 5\' 1"  (1.549 m)  Wt 165 lb 14.4 oz (75.252 kg)  BMI 31.36 kg/m2  LMP 03/28/2014 Gen: No acute distress. Alert, pleasant and cooperative with exam HEENT: Mild tenderness to the posterior right scalp, no swelling noted, EOMI, PERRLA, Oropharynx clear. MMM CV: RRR. III/VI systolic murmur. No rubs or gallops noted.  Resp: CTAB. No wheezing, crackles, or rhonchi noted. Back: Pain over the right paraspinal muscles without pain over the spinal processes. No ecchymoses or swelling noted.  MSK: No edema. No gross deformities. Mild tenderness to palpation over the R lateal hip with preserved ROM and strength. Mild pain with palpation over the upper anterior chest wall near the axillary fold. Full range of motion and strength. Neuro: Alert and oriented, No gross focal deficits   Assessment/Plan:  Lumbar back pain Patient's pain most likely due to her fall on Wednesday. No red flags on exam: no loss of bowel/bladder control, no paraesthesia, no radicular symptoms. Additionally, her right sided chest pain is also due to the fall- pain is worsened with palpation, very localized, and is not associated with diaphoresis or SOB.  - No need for imaging or further work-up currently  - Re-assured the patient that this pain will resolve, it will simply take time - Showed her back exercises, such as back arches - Heat and ice as needed

## 2014-04-06 NOTE — Patient Instructions (Addendum)
Your back pain and chest pain is from the fall you had this Wednesday. There is nothing concerning about your physical exam. The pain will eventually go away, it may just take about 6 weeks  You can do back arches to help strengthen your back and help with the pain. Lie on your stomach, propping yourself up on bent elbows. Slowly press on your hands, causing an arch in your low back. Repeat 3 to 5 times. Any initial stiffness and discomfort should lessen with repetition over time. You can also use a heating pad or ice to help with the pain Please make an appointment with me as soon as possible so that we can do your pap smear

## 2014-04-07 ENCOUNTER — Telehealth: Payer: Self-pay | Admitting: *Deleted

## 2014-04-07 NOTE — Telephone Encounter (Signed)
Called Ms. Fiser. She states her last BP was 135/100 with a HR of 93. She denied any headache, vision change, or chest pain. I asked her to re-take it in 15 minutes. When I called back she told me her BP was 130/102 and the "little number is 93."  When I asked her to read the numbers out to me as they showed up on her machine she states 130, then 93 under it, then 102. This makes me think her BP is in fact 130/92 with a HR of 102-- her DBP is slightly elevated, but this seems more consistent with her BPs in clinic. An aid comes to her house every day. I asked her to let the office know if her BP continues to be elevated at home: If her BPs remain elevated, >140/90, I'd like for her to have a nursing appointment to have her BP check with our equipment and then her equipment.  [name], MD [facility] Resident  04/07/2014, 2:54 PM

## 2014-04-07 NOTE — Telephone Encounter (Signed)
Pt called stating her home health nurse came out to see her today; told her that her BP was high.  Pt didn't know what the reading was, but she checked it with her machine 145/86 and heart rate 111.  Pt denies any symptoms today.  Will forward to PCP.  Derl Barrow, RN

## 2014-05-10 ENCOUNTER — Encounter: Payer: Self-pay | Admitting: Family Medicine

## 2014-05-10 ENCOUNTER — Other Ambulatory Visit (HOSPITAL_COMMUNITY)
Admission: RE | Admit: 2014-05-10 | Discharge: 2014-05-10 | Disposition: A | Discharge status: Home / Self Care | Payer: Medicaid Other | Source: Ambulatory Visit | Attending: Family Medicine | Admitting: Family Medicine

## 2014-05-10 ENCOUNTER — Ambulatory Visit (INDEPENDENT_AMBULATORY_CARE_PROVIDER_SITE_OTHER): Payer: Medicaid Other | Admitting: Family Medicine

## 2014-05-10 VITALS — BP 132/85 | HR 87 | Temp 98.6°F | Ht 61.0 in | Wt 170.0 lb

## 2014-05-10 DIAGNOSIS — Z01419 Encounter for gynecological examination (general) (routine) without abnormal findings: Secondary | ICD-10-CM | POA: Diagnosis not present

## 2014-05-10 DIAGNOSIS — K625 Hemorrhage of anus and rectum: Secondary | ICD-10-CM

## 2014-05-10 DIAGNOSIS — Z124 Encounter for screening for malignant neoplasm of cervix: Secondary | ICD-10-CM

## 2014-05-10 DIAGNOSIS — Z1151 Encounter for screening for human papillomavirus (HPV): Secondary | ICD-10-CM | POA: Insufficient documentation

## 2014-05-10 MED ORDER — AZELASTINE HCL 0.15 % NA SOLN: 1.0000 | Freq: Two times a day (BID) | NASAL | Status: DC | PRN

## 2014-05-10 NOTE — Patient Instructions (Signed)
It was good to see you again. Please keep a diary of your blood pressures. If you develop fever, worsening cough, shortness of breath, or chest pain please contact the clinic to be evaluated. I will call you if your results are NOT normal. Please follow up with me in 3 months

## 2014-05-10 NOTE — Progress Notes (Signed)
Family Medicine  [name], MD  Subjective:  Chief complaint: Physical exam LMP 04/09/2014   The patient has a menstrual cycle every month. Notes that in November she had 2 cycles. She bled for approximately 5 days, had a week off, and then her menses returned for approximately 5 days. This was unusual for her. No abnormal vaginal discharge. No pruritus. No foul smell. The patient states that she has never been sexually active.    Additionally, the patient notes she had one episode of red bloody stool last Thursday. She notes that there was some blood on the outside of the stool and on the toilet paper with wiping. She notes that she would occasionally have bright red blood due to her history of hemorrhoids. She did not feel that she had to strain or had hard stool. She currently denies any bloody stools or difficultly with BMs. No increased fatigue.   Past Medical History Patient Active Problem List   Diagnosis Date Noted  . Encounter for cervical Pap smear with pelvic exam 05/12/2014  . Lumbar back pain 04/06/2014  . Health care maintenance 03/06/2014  . Acute bronchitis, bacterial 02/12/2014  . H/O liver transplant 11/23/2013  . Thrombocytopenia 03/09/2013  . Seizure disorder 03/01/2013  . Schizophrenia, paranoid type 06/25/2012    Class: Chronic  . 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 Vision loss, bilateral 03/30/2012  . 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 Menorrhagia 12/22/2010  . Unspecified vitamin D deficiency 09/13/2010  . HEMATURIA UNSPECIFIED 06/14/2010  . Rectal bleeding 06/14/2010  . ANEMIA 03/03/2010  . GERD 07/21/2008  . 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 CARDIAC FLOW MURMUR 10/16/2007  . EPILEPSY 08/23/2006  . ALLERGIC RHINITIS 07/30/2006  . OBESITY, NOS 07/25/2006  . 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 Mental retardation 07/25/2006  . ASTHMA, PERSISTENT 07/25/2006  . OSTEOARTHRITIS, LOWER LEG 07/25/2006    Medications- reviewed and  updated Current Outpatient Prescriptions  Medication Sig Dispense Refill  . albuterol (PROVENTIL,VENTOLIN) 90 MCG/ACT inhaler Inhale 2 puffs into the lungs every 4 (four) hours as needed for wheezing.     . Azelastine HCl (ASTEPRO) 0.15 % SOLN Place 1 spray into the nose 2 (two) times daily as needed (allergic rhinitis). 30 mL 2  . azithromycin (ZITHROMAX) 250 MG tablet Take 2 tablets by mouth on day one, then take 1 tablet daily for the next 4 days 6 tablet 0  . budesonide-formoterol (SYMBICORT) 160-4.5 MCG/ACT inhaler Inhale 1 puff into the lungs 2 (two) times daily.  1 Inhaler   . calcium-vitamin D (CALCIUM + D) 250-125 MG-UNIT per tablet Take 2 tablets by mouth 2 (two) times daily. 1 pm & 5 pm.    . carboxymethylcellulose (REFRESH TEARS) 0.5 % SOLN Place 1 drop into both eyes 4 (four) times daily as needed (for dry eyes).    . cycloSPORINE modified 25 MG CAPS Take 200 mg by mouth every 12 (twelve) hours.    . divalproex (DEPAKOTE) 250 MG DR tablet Take 5 tablets (1,250 mg total) by mouth at bedtime. 150 tablet 11  . magnesium oxide (MAG-OX) 400 MG tablet Take 400 mg by mouth 2 (two) times daily. 1 pm & 5 pm.    . Melatonin 3 MG CAPS Take 3 mg by mouth at bedtime.    . montelukast (SINGULAIR) 10 MG tablet Take 10 mg by mouth daily. 9 am. 30 tablet 12  . Multiple Vitamin (MULTIVITAMIN) tablet Take 1 tablet by mouth daily. 9 am.    .  mycophenolate (CELLCEPT) 250 MG capsule Take 250 mg by mouth 2 (two) times daily. 9 am & 9 pm.    . omeprazole (PRILOSEC) 40 MG capsule Take 40 mg by mouth daily. 9 am.    . oxycodone (OXY-IR) 5 MG capsule Take 5-10 mg by mouth every 4 (four) hours as needed for pain.    . polyethylene glycol (MIRALAX / GLYCOLAX) packet Take 17 g by mouth daily as needed (for constipation). In water or juice    . senna-docusate (SENOKOT-S) 8.6-50 MG per tablet Take 2 tablets by mouth 2 (two) times daily.     . valGANciclovir (VALCYTE) 450 MG tablet Take 2 tablets (900 mg total) by  mouth every evening. At 1700     No current facility-administered medications for this visit.    Objective: BP 132/85 mmHg  Pulse 87  Temp(Src) 98.6 F (37 C) (Oral)  Ht 5\' 1"  (1.549 m)  Wt 170 lb (77.111 kg)  BMI 32.14 kg/m2  LMP 04/12/2014 Gen: No acute distress. Alert, cooperative with exam HEENT: Atraumatic. Oropharynx clear. MMM CV: RRR. No murmurs, rubs, or gallops noted. 2+ radial and DP pulses bilaterally. Resp: CTAB. No wheezing, crackles, or rhonchi noted. Abd: +BS. Soft, non-distended, non-tender. No rebound or guarding.  GYN:  External genitalia within normal limits.  Vaginal mucosa pink, moist, normal rugae.  Mildly friable cervix without lesions, no discharge noted on speculum exam.  Bimanual exam revealed normal, nongravid uterus.  No cervical motion tenderness. No adnexal masses bilaterally.  Rectal: Normal tone, no anal fissures or internal/external hemorrhoids noted.. Stool brown, single stool card negative   Assessment/Plan:  Encounter for cervical Pap smear with pelvic exam Pap smear performed with cytology and high risk HPV pending. No red flags on exam. -Will contact patient if there are abnormal results. -Given the patient has never been sexually active, consider the risk and benefits of close monitoring with Pap smears as she is immunocompromised  Rectal bleeding No hemorrhoids or anal fissures noted on exam. Stool card negative. The patient currently denies any bloody stools. No pain with defecation -Discussed anti-hemorrhoidals with the patient, as she is use these often in the past. -Advised the patient to return if she notes bright red blood in her stool again.    No orders of the defined types were placed in this encounter.    Meds ordered this encounter  Medications  . Azelastine HCl (ASTEPRO) 0.15 % SOLN    Sig: Place 1 spray into the nose 2 (two) times daily as needed (allergic rhinitis).    Dispense:  30 mL    Refill:  2

## 2014-05-11 LAB — CYTOLOGY - PAP

## 2014-05-12 ENCOUNTER — Encounter: Payer: Self-pay | Admitting: Family Medicine

## 2014-05-12 DIAGNOSIS — Z01419 Encounter for gynecological examination (general) (routine) without abnormal findings: Secondary | ICD-10-CM | POA: Insufficient documentation

## 2014-05-12 NOTE — Assessment & Plan Note (Signed)
Pap smear performed with cytology and high risk HPV pending. No red flags on exam. -Will contact patient if there are abnormal results. -Given the patient has never been sexually active, consider the risk and benefits of close monitoring with Pap smears as she is immunocompromised

## 2014-05-12 NOTE — Assessment & Plan Note (Signed)
No hemorrhoids or anal fissures noted on exam. Stool card negative. The patient currently denies any bloody stools. No pain with defecation -Discussed anti-hemorrhoidals with the patient, as she is use these often in the past. -Advised the patient to return if she notes bright red blood in her stool again.

## 2014-06-08 ENCOUNTER — Encounter: Payer: Self-pay | Admitting: Adult Health

## 2014-06-08 ENCOUNTER — Ambulatory Visit (INDEPENDENT_AMBULATORY_CARE_PROVIDER_SITE_OTHER): Payer: Medicaid Other | Admitting: Adult Health

## 2014-06-08 VITALS — BP 139/95 | HR 98 | Temp 98.0°F | Ht 61.0 in | Wt 177.0 lb

## 2014-06-08 DIAGNOSIS — Z5181 Encounter for therapeutic drug level monitoring: Secondary | ICD-10-CM

## 2014-06-08 DIAGNOSIS — G40909 Epilepsy, unspecified, not intractable, without status epilepticus: Secondary | ICD-10-CM

## 2014-06-08 MED ORDER — DIVALPROEX SODIUM 250 MG PO DR TAB: 1250.0000 mg | DELAYED_RELEASE_TABLET | Freq: Every day | ORAL | Status: DC

## 2014-06-08 NOTE — Patient Instructions (Signed)
Overall you are doing well.  Continue to take Depakote 250 mg 5 tablets at bedtime. I will refill today We will check blood work today- I will call you with results.  If your symptoms worsen or you develop new symptoms please let me know.

## 2014-06-08 NOTE — Progress Notes (Signed)
PATIENT: [name] DOB: 11-04-1975  REASON FOR VISIT: follow up- seizures HISTORY FROM: patient  HISTORY OF PRESENT ILLNESS: [name] is a 39 year old female with a history of seizures. She returns today for follow-up. She is currently taking Depakote 250 mg 5 tablets at bedtime. She states that she hasn't had a seizure since the last visit. The patient does have a mild history of mental retardation and she is alone today. Patient does not operate a motor vehicle. She states that she lives alone but her uncle lives beside of her. She has an Aid that helps her in the afternoons and goes to daycare on Monday, Thursday and Friday. Denies any difficulty with gait or balance.   HISTORY 03/23/14 [name]): [name], 39 year old black female returns today for followup of epilepsy.  [name]. She has a history of mild mental retardation, and long-standing history of seizures. She was  well-controlled on Depakote and Tegretol extended release.  But she has developed liver disease, was diagnosed with primary biliary cirrhosis,Antieptical medication was changed since February 2013, she is now taking Vimpat 100 mg 2 tabs twice a day, Keppra 500 mg 3 tabs daily. She has no significant seizure activities while taking Vimpat, and Keppra.She lives alone, uncles lives next door, she attends adult daycare regularly.She underwent liver transplantation in February 2015, doing very well, on polypharmacy treatment, this including CellCept 1000 mg twice a day, Prograft 4 mg twice a day,For her seizure, she is now taking deapkote ER 250mg  5 tabs qhs, she is having seizure about once amonth, it usually happens at nighttime, she was noted to have nighttime extremity jumping movement, tongue biting when she woke up, she also complains of mild gait difficullty,   UPDATE Oct 27th 2015:I have talked with [name] liver transplant cordinator, reported, 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, at [facility], Keppra,  Vimpat cause worsening behavior issue, she had a hallucinations, paranoid to the point of almost institutionalized, her mood disorder eventually was under control with Depakote ER 250 mg 5 tablets each day, [name] continues to complains that she had a 2 episode of working up with blood in her mouth, has true to her tongue, one in July, the second one was September 2015, overall she is tolerating the medication well, Depakote level was 63 in February 23 2014, EEG was normal.Repeat laboratory evaluation in March 09 2014, showed decreased white count 1.6, she is receiving weekly laboratory evaluation by [name] transplant team, I have called coordinator again, is aware of her leukopenia, she is on lower dose of immunosuppressive treatment, now taking cyclosporine 200 mg twice a day, CellCept 250 mg twice a day,She has personal aid every evening,  REVIEW OF SYSTEMS: Out of a complete 14 system review of symptoms, the patient complains only of the following symptoms, and all other reviewed systems are negative.  Ear pain, runny nose, chills, cough, restless leg,  dreams  ALLERGIES: Allergies  Allergen Reactions  . Tramadol Hcl Itching and Nausea And Vomiting    Causes SEIZURES  . Aspirin Nausea And Vomiting    Due to Stomach ulcer  . Penicillins Itching, Swelling and Rash       . Pseudoephedrine Itching and Swelling    Tongue swelling  . Sulfa Antibiotics Nausea And Vomiting  . Latex Itching and Rash    HOME MEDICATIONS: Outpatient Prescriptions Prior to Visit  Medication Sig Dispense Refill  . albuterol (PROVENTIL,VENTOLIN) 90 MCG/ACT inhaler Inhale 2 puffs into the  lungs every 4 (four) hours as needed for wheezing.     . Azelastine HCl (ASTEPRO) 0.15 % SOLN Place 1 spray into the nose 2 (two) times daily as needed (allergic rhinitis). 30 mL 2  . azithromycin (ZITHROMAX) 250 MG tablet Take 2 tablets by mouth on day one, then take 1 tablet daily for the next 4 days 6 tablet 0  .  budesonide-formoterol (SYMBICORT) 160-4.5 MCG/ACT inhaler Inhale 1 puff into the lungs 2 (two) times daily.  1 Inhaler   . calcium-vitamin D (CALCIUM + D) 250-125 MG-UNIT per tablet Take 2 tablets by mouth 2 (two) times daily. 1 pm & 5 pm.    . carboxymethylcellulose (REFRESH TEARS) 0.5 % SOLN Place 1 drop into both eyes 4 (four) times daily as needed (for dry eyes).    Marland Kitchen divalproex (DEPAKOTE) 250 MG DR tablet Take 5 tablets (1,250 mg total) by mouth at bedtime. 150 tablet 11  . magnesium oxide (MAG-OX) 400 MG tablet Take 400 mg by mouth 2 (two) times daily. 1 pm & 5 pm.    . Melatonin 3 MG CAPS Take 3 mg by mouth at bedtime.    . montelukast (SINGULAIR) 10 MG tablet Take 10 mg by mouth daily. 9 am. 30 tablet 12  . Multiple Vitamin (MULTIVITAMIN) tablet Take 1 tablet by mouth daily. 9 am.    . omeprazole (PRILOSEC) 40 MG capsule Take 40 mg by mouth daily. 9 am.    . oxycodone (OXY-IR) 5 MG capsule Take 5-10 mg by mouth every 4 (four) hours as needed for pain.    . polyethylene glycol (MIRALAX / GLYCOLAX) packet Take 17 g by mouth daily as needed (for constipation). In water or juice    . senna-docusate (SENOKOT-S) 8.6-50 MG per tablet Take 2 tablets by mouth 2 (two) times daily.     . valGANciclovir (VALCYTE) 450 MG tablet Take 2 tablets (900 mg total) by mouth every evening. At 1700    . mycophenolate (CELLCEPT) 250 MG capsule Take 250 mg by mouth 2 (two) times daily. 9 am & 9 pm.    . cycloSPORINE modified 25 MG CAPS Take 200 mg by mouth every 12 (twelve) hours.     No facility-administered medications prior to visit.    PAST MEDICAL HISTORY: Past Medical History  Diagnosis Date  . Allergy   . Anemia   . Intellectual disability   . Constipation   . Abnormal vaginal bleeding   . Biliary cirrhosis 04/26/11  . 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 DUB (dysfunctional uterine bleeding)   . PONV (postoperative nausea and vomiting)   . Seizures     congenital epilepsy-hx. seizures  .  Asthma 09-23-12    Asthma somewhat causing increase wheezing and mild congestion at present  . GERD (gastroesophageal reflux disease)   . Arthritis     right knee  . Vitamin D deficiency     PAST SURGICAL HISTORY: Past Surgical History  Procedure Laterality Date  . Appendectomy    . Cholecystectomy    . Eye muscle surgery    . Eustacean tubes    . Colonoscopy w/ biopsies    . Adenonoidectomy    . Esophagogastroduodenoscopy (egd) with propofol N/A 10/01/2012    Procedure: ESOPHAGOGASTRODUODENOSCOPY (EGD) WITH PROPOFOL;  Surgeon: Arta Silence, MD;  Location: [facility];  Service: Endoscopy;  Laterality: N/A;  . Liver transplant      FAMILY HISTORY: Family History  Problem Relation Age of Onset  . Diabetes Mother   .  Heart disease Mother   . COPD Mother     SOCIAL HISTORY: History   Social History  . Marital Status: Single    Spouse Name: N/A    Number of Children: N/A  . Years of Education: N/A   Occupational History  . Not on file.   Social History Main Topics  . Smoking status: Never Smoker   . Smokeless tobacco: Never Used  . Alcohol Use: No  . Drug Use: No  . Sexual Activity: No   Other Topics Concern  . Not on file   Social History Narrative   Lives alone but uncle and his wife live next door and are her primary caregivers. Sonia Side (uncle) can be reached at [phone].       PHYSICAL EXAM  Filed Vitals:   06/08/14 1050  BP: 139/95  Pulse: 98  Temp: 98 F (36.7 C)  TempSrc: Oral  Height: 5\' 1"  (1.549 m)  Weight: 177 lb (80.287 kg)   Body mass index is 33.46 kg/(m^2).  Generalized: Well developed, in no acute distress   Neurological examination  Mentation: Alert oriented to time, place, history taking. Follows all commands speech and language fluent Cranial nerve II-XII: Pupils were equal round reactive to light. Extraocular movements were full, visual field were full on confrontational test. Facial sensation and strength were normal. Uvula  tongue midline. Head turning and shoulder shrug  were normal and symmetric. Motor: The motor testing reveals 5 over 5 strength of all 4 extremities. Good symmetric motor tone is noted throughout.  Sensory: Sensory testing is intact to soft touch on all 4 extremities. No evidence of extinction is noted.  Coordination: Cerebellar testing reveals good finger-nose-finger and heel-to-shin bilaterally.  Gait and station: Gait is normal. Tandem gait is normal. Romberg is negative. No drift is seen.  Reflexes: Deep tendon reflexes are symmetric and normal bilaterally.    DIAGNOSTIC DATA (LABS, IMAGING, TESTING) - I reviewed patient records, labs, notes, testing and imaging myself where available.  Lab Results  Component Value Date   WBC 1.6* 03/09/2014   HGB 8.7* 03/09/2014   HCT 24.6* 03/09/2014   MCV 79 03/09/2014   PLT 105* 03/09/2014      Component Value Date/Time   NA 139 02/23/2014 1644   NA 143 10/15/2013 1216   K 4.9 02/23/2014 1644   CL 102 02/23/2014 1644   CO2 22 02/23/2014 1644   GLUCOSE 95 02/23/2014 1644   GLUCOSE 116* 10/15/2013 1216   BUN 24* 02/23/2014 1644   BUN 12 10/15/2013 1216   CREATININE 1.00 02/23/2014 1644   CREATININE 0.62 05/19/2013 1514   CALCIUM 8.4* 02/23/2014 1644   CALCIUM 9.0 09/11/2007 2301   PROT 5.9* 02/23/2014 1644   PROT 6.8 10/15/2013 1216   ALBUMIN 4.1 10/15/2013 1216   AST 49* 02/23/2014 1644   ALT 38* 02/23/2014 1644   ALKPHOS 80 02/23/2014 1644   BILITOT 0.4 02/23/2014 1644   GFRNONAA 71 02/23/2014 1644   GFRNONAA >89 11/09/2011 1208   GFRAA 82 02/23/2014 1644   GFRAA >89 11/09/2011 1208   Lab Results  Component Value Date   CHOL 267* 02/04/2008   HDL 106 02/04/2008   LDLCALC 142* 02/04/2008   LDLDIRECT 64 07/10/2011   TRIG 94 02/04/2008   CHOLHDL 2.5 Ratio 02/04/2008   Lab Results  Component Value Date   HGBA1C 5.3 09/19/2010   Lab Results  Component Value Date   VITAMINB12 616 08/13/2007   Lab Results  Component  Value Date  TSH 2.548 01/14/2013      ASSESSMENT AND PLAN 39 y.o. year old female  has a past medical history of Allergy; Anemia; Intellectual disability; Constipation; Abnormal vaginal bleeding; Biliary cirrhosis (04/26/11); Primary biliary cirrhosis (05/07/2011); DUB (dysfunctional uterine bleeding); PONV (postoperative nausea and vomiting); Seizures; Asthma (09-23-12); GERD (gastroesophageal reflux disease); Arthritis; and Vitamin D deficiency. here with:  1. Seizures  Overall the patient is doing well. She will continue taking Depakote 250 mg 5 tablets at bedtime. I will refill today. I will also check blood work today. If she has any additional seizures she should let us know. Otherwise she will follow up in 6 months or sooner if needed.    Ward Givens, MSN, NP-C 06/08/2014, 10:56 AM [facility] [address], [place], [place] [address] [phone]  Note: This document was prepared with digital dictation and possible smart phrase technology. Any transcriptional errors that result from this process are unintentional.

## 2014-06-09 ENCOUNTER — Telehealth: Payer: Self-pay | Admitting: Adult Health

## 2014-06-09 DIAGNOSIS — Z5181 Encounter for therapeutic drug level monitoring: Secondary | ICD-10-CM

## 2014-06-09 LAB — COMPREHENSIVE METABOLIC PANEL
A/G RATIO: 1.4 (ref 1.1–2.5)
ALBUMIN: 4 g/dL (ref 3.5–5.5)
ALT: 18 IU/L (ref 0–32)
AST: 22 IU/L (ref 0–40)
Alkaline Phosphatase: 73 IU/L (ref 39–117)
BUN/Creatinine Ratio: 30 — ABNORMAL HIGH (ref 9–23)
BUN: 24 mg/dL (ref 6–24)
CO2: 23 mmol/L (ref 18–29)
Calcium: 8.8 mg/dL (ref 8.7–10.2)
Chloride: 105 mmol/L (ref 97–108)
Creatinine, Ser: 0.81 mg/dL (ref 0.57–1.00)
GFR calc non Af Amer: 91 mL/min/{1.73_m2} (ref >59)
GFR, EST AFRICAN AMERICAN: 105 mL/min/{1.73_m2} (ref >59)
GLUCOSE: 101 mg/dL — AB (ref 65–99)
Globulin, Total: 2.8 g/dL (ref 1.5–4.5)
Potassium: 4.8 mmol/L (ref 3.5–5.2)
Sodium: 141 mmol/L (ref 134–144)
TOTAL PROTEIN: 6.8 g/dL (ref 6.0–8.5)
Total Bilirubin: 0.3 mg/dL (ref 0.0–1.2)

## 2014-06-09 LAB — CBC WITH DIFFERENTIAL
Basophils Absolute: 0 10*3/uL (ref 0.0–0.2)
Basos: 2 %
EOS ABS: 0.3 10*3/uL (ref 0.0–0.4)
Eos: 12 %
HEMATOCRIT: 29.4 % — AB (ref 34.0–46.6)
Hemoglobin: 9.8 g/dL — ABNORMAL LOW (ref 11.1–15.9)
Immature Grans (Abs): 0 10*3/uL (ref 0.0–0.1)
Immature Granulocytes: 0 %
LYMPHS ABS: 1 10*3/uL (ref 0.7–3.1)
Lymphs: 41 %
MCH: 29.6 pg (ref 26.6–33.0)
MCHC: 33.3 g/dL (ref 31.5–35.7)
MCV: 89 fL (ref 79–97)
MONOCYTES: 20 %
Monocytes Absolute: 0.5 10*3/uL (ref 0.1–0.9)
NEUTROS ABS: 0.6 10*3/uL — AB (ref 1.4–7.0)
Neutrophils Relative %: 25 %
PLATELETS: 108 10*3/uL — AB (ref 150–379)
RBC: 3.31 x10E6/uL — ABNORMAL LOW (ref 3.77–5.28)
RDW: 15.2 % (ref 12.3–15.4)
WBC: 2.4 10*3/uL — AB (ref 3.4–10.8)

## 2014-06-09 LAB — VALPROIC ACID LEVEL: VALPROIC ACID LVL: 67 ug/mL (ref 50–100)

## 2014-06-09 LAB — AMMONIA: Ammonia: 147 ug/dL (ref 19–87)

## 2014-06-09 MED ORDER — DIVALPROEX SODIUM 250 MG PO DR TAB: 1000.0000 mg | DELAYED_RELEASE_TABLET | Freq: Every day | ORAL | Status: DC

## 2014-06-09 NOTE — Telephone Encounter (Signed)
I called the patient. I explained that her ammonia level came back elevated on Depakote. The patient's seizures have been well controlled with Depakote. I will decrease her dose down to 4 tablets at bedtime and recheck blood work in 2 weeks. If her ammonia remains elevated we will have to consider switching medications. The patient requested that I call her nurse and make him aware as well. I did call Scott at [phone] and made him aware of the changes. I also requested that if  the patient has any additional seizures they let us know. He verbalized understanding.

## 2014-06-09 NOTE — Telephone Encounter (Signed)
error 

## 2014-06-11 NOTE — Progress Notes (Signed)
I agree above plan. 

## 2014-06-16 ENCOUNTER — Telehealth: Payer: Self-pay | Admitting: Adult Health

## 2014-06-16 NOTE — Telephone Encounter (Signed)
Scott the nurse for [facility] is there and just wanted to make you aware that pt had an elevated blood pressure today right arm 138/98 left arm 144/98.  He checked several times during visit and it stayed the same.  Pt denies any dizziness and seizure activity.   Just wanted to inform you.

## 2014-06-16 NOTE — Telephone Encounter (Signed)
Noted  

## 2014-06-24 ENCOUNTER — Telehealth: Payer: Self-pay | Admitting: Family Medicine

## 2014-06-24 NOTE — Telephone Encounter (Signed)
Pt stated she has not had a period in the last two months. Pt state she usually have a normal period.  Appt schedule 06/28/2014.  Derl Barrow, RN

## 2014-06-24 NOTE — Telephone Encounter (Signed)
Pt called and would like to speak to the doctor because she has not had her period for two months and isn't sure what to do. jw

## 2014-06-28 ENCOUNTER — Ambulatory Visit (INDEPENDENT_AMBULATORY_CARE_PROVIDER_SITE_OTHER): Payer: Medicaid Other | Admitting: Family Medicine

## 2014-06-28 ENCOUNTER — Encounter: Payer: Self-pay | Admitting: Family Medicine

## 2014-06-28 VITALS — BP 127/86 | HR 101 | Temp 98.1°F | Ht 61.0 in | Wt 172.0 lb

## 2014-06-28 DIAGNOSIS — N912 Amenorrhea, unspecified: Secondary | ICD-10-CM | POA: Insufficient documentation

## 2014-06-28 DIAGNOSIS — M25511 Pain in right shoulder: Secondary | ICD-10-CM | POA: Insufficient documentation

## 2014-06-28 LAB — COMPREHENSIVE METABOLIC PANEL
ALT: 23 U/L (ref 0–35)
AST: 25 U/L (ref 0–37)
Albumin: 3.7 g/dL (ref 3.5–5.2)
Alkaline Phosphatase: 65 U/L (ref 39–117)
BUN: 24 mg/dL — ABNORMAL HIGH (ref 6–23)
CHLORIDE: 106 meq/L (ref 96–112)
CO2: 23 mEq/L (ref 19–32)
CREATININE: 0.83 mg/dL (ref 0.50–1.10)
Calcium: 8.2 mg/dL — ABNORMAL LOW (ref 8.4–10.5)
Glucose, Bld: 104 mg/dL — ABNORMAL HIGH (ref 70–99)
Potassium: 4.2 mEq/L (ref 3.5–5.3)
SODIUM: 138 meq/L (ref 135–145)
TOTAL PROTEIN: 6.7 g/dL (ref 6.0–8.3)
Total Bilirubin: 0.5 mg/dL (ref 0.2–1.2)

## 2014-06-28 LAB — TSH: TSH: 5.429 u[IU]/mL — AB (ref 0.350–4.500)

## 2014-06-28 LAB — POCT URINE PREGNANCY: PREG TEST UR: NEGATIVE

## 2014-06-28 NOTE — Assessment & Plan Note (Signed)
Right shoulder pain with tenderness to palpation on exam Recommended heat to start with Follow-up in one month PCP

## 2014-06-28 NOTE — Assessment & Plan Note (Addendum)
Amenorrhea in a 39 year old female with history of liver transplant and currently on Depakote for seizure prophylaxis Check TSH only today given that it has only been 2 months. Likely due to Depakote  Pregnancy test negative today Follow-up with PCP in one month  If amenorrhea persists one more month would consider evaluation for PCOS given her BMI

## 2014-06-28 NOTE — Progress Notes (Signed)
Patient ID: [name], female   DOB: 1975-11-04, 39 y.o.   MRN: [id number]   HPI  Patient presents today for amenorrhea  Patient explains that she's not had a period last 2 months. She states that prior to this she had regular periods every 4 weeks bleeding for 5 days using about 5 pads per day.  Last year when she had liver disease her periods went away for a period of several months until after her liver transplant. Her periods have been normal since May of last year.  She states that recently she had her Depakote dose decreased due to an elevated ammonia level in the clinic.   She denies being sexually active, she also denies fever, chills, sweats, chest pain, abdominal pain.  Right shoulder pain Over the last 2 days, no trauma, described as severe   Smoking status noted ROS: Per HPI  Objective: BP 127/86 mmHg  Pulse 101  Temp(Src) 98.1 F (36.7 C) (Oral)  Ht 5\' 1"  (1.549 m)  Wt 172 lb (78.019 kg)  BMI 32.52 kg/m2  LMP 04/27/2014 Gen: NAD, alert, cooperative with exam, mentation slow HEENT: NCAT CV: RRR, good S1/S2, no murmur Resp: CTABL, no wheezes, non-labored Abd: SNTND, BS present, no guarding or organomegaly Ext: No edema, warm Neuro: Alert and oriented, No gross deficits  Musculoskeletal: Right shoulder with tenderness to palpation across the anterior portion of the shoulder, slight tenderness with empty can test, no tenderness with Hawkins test  Urine pregnancy test negative today  Assessment and plan:  Amenorrhea Amenorrhea in a 39 year old female with history of liver transplant and currently on Depakote for seizure prophylaxis Check TSH only today given that it has only been 2 months. Likely due to Depakote  Pregnancy test negative today Follow-up with PCP in one month  If amenorrhea persists one more month would consider evaluation for PCOS given her BMI    Right shoulder pain Right shoulder pain with tenderness to palpation on exam Recommended  heat to start with Follow-up in one month PCP      Orders Placed This Encounter  Procedures  . TSH  . Comprehensive metabolic panel  . POCT urine pregnancy

## 2014-06-28 NOTE — Patient Instructions (Signed)
Great to meet you!  Please come back to see Dr. Lorenso Courier in 1 month  I will let you know how your thyroid test comes back.

## 2014-06-29 ENCOUNTER — Telehealth: Payer: Self-pay | Admitting: Adult Health

## 2014-06-29 ENCOUNTER — Telehealth: Payer: Self-pay | Admitting: Family Medicine

## 2014-06-29 DIAGNOSIS — N912 Amenorrhea, unspecified: Secondary | ICD-10-CM

## 2014-06-29 NOTE — Telephone Encounter (Signed)
Called to discuss low thyroid. TSH elevated, we should repeat it to be sure.   Add free T4  Will ask staff to call and arrange.   Laroy Apple, MD [place] Resident, PGY-3 06/29/2014, 3:41 PM

## 2014-06-29 NOTE — Telephone Encounter (Signed)
Pt is calling to see if we would call the [facility] nurse to draw patient's blood while she is doing her visit tomorrow.  Please call patient back and advise.

## 2014-06-29 NOTE — Telephone Encounter (Signed)
-----   Message from Ward Givens, NP sent at 06/09/2014  2:35 PM EST ----- Recheck ammonia and Depakote level

## 2014-06-29 NOTE — Telephone Encounter (Signed)
I called the patient and called [facility]. I will fax the orders in the morning for the labs and it can be drawn at her home.  Fax # [phone] [facility] [phone]

## 2014-06-29 NOTE — Telephone Encounter (Signed)
I called the patient and reminded her to come in for blood work. Her Depakote and ammonia level needs to be rechecked.

## 2014-06-30 ENCOUNTER — Telehealth: Payer: Self-pay | Admitting: *Deleted

## 2014-06-30 NOTE — Telephone Encounter (Signed)
Faxed lab orders to [facility]. Deseree Kennon Holter, CMA

## 2014-06-30 NOTE — Telephone Encounter (Signed)
[facility] nurse called this am and needs labs faxed to him and when you fax labs be sure to put our return fax number and put to the attn: [name]

## 2014-06-30 NOTE — Telephone Encounter (Signed)
-----   Message from Timmothy Euler, MD sent at 06/30/2014 12:10 PM EST ----- Do you mind faxing the orders for her TSH and free T4 to [facility] health to have the [facility] nurse draw them for [place]?   Also will you disregard my previous request to call and set up a lab appt?  Thanks! Sam

## 2014-07-06 ENCOUNTER — Telehealth: Payer: Self-pay | Admitting: Adult Health

## 2014-07-06 NOTE — Telephone Encounter (Signed)
spoke to patient to let them know the labs were not in yet and we will call her as soon as they come in to [place]

## 2014-07-06 NOTE — Telephone Encounter (Signed)
Patient calling for blood work results.  Please call and advise.

## 2014-07-06 NOTE — Telephone Encounter (Signed)
We have not received her lab results yet. Please call and let her know.

## 2014-07-06 NOTE — Telephone Encounter (Signed)
Wants lab results

## 2014-07-06 NOTE — Telephone Encounter (Signed)
Please give labs

## 2014-07-15 ENCOUNTER — Telehealth: Payer: Self-pay | Admitting: *Deleted

## 2014-07-15 NOTE — Telephone Encounter (Signed)
[facility] 07/01/14: CMP: Sodium 133 Potassium 4.4  Valproic Acid 57  CBC: WBC 2.7 Hemoglobin 9.4 Hematocrit 28.1  WBC Differential: Neutrophil % 25 Lymphocytes 1.0

## 2014-07-27 ENCOUNTER — Encounter: Payer: Self-pay | Admitting: Family Medicine

## 2014-07-27 ENCOUNTER — Ambulatory Visit (INDEPENDENT_AMBULATORY_CARE_PROVIDER_SITE_OTHER): Payer: Medicaid Other | Admitting: Family Medicine

## 2014-07-27 VITALS — BP 126/82 | HR 97 | Temp 97.6°F | Ht 61.0 in | Wt 181.0 lb

## 2014-07-27 DIAGNOSIS — N912 Amenorrhea, unspecified: Secondary | ICD-10-CM

## 2014-07-27 DIAGNOSIS — E669 Obesity, unspecified: Secondary | ICD-10-CM

## 2014-07-27 DIAGNOSIS — M25511 Pain in right shoulder: Secondary | ICD-10-CM

## 2014-07-27 NOTE — Assessment & Plan Note (Signed)
No lifestyle changes that the patient can pin-point leading to her continued weight gain. - Will refer to nutrition - Discussed the importance of regular cardiovascular exercise. - Will f/u with [facility] about labs to determine if thyroid dysfunction could be the cause.

## 2014-07-27 NOTE — Progress Notes (Signed)
Patient ID: [name], female   DOB: February 25, 1976, 39 y.o.   MRN: [id number]     Call uncle, [name]: [phone] (can leave messages with him, he lives next door).   Subjective: CC: f/u amenorrhea  HPI: Patient is a 39 y.o. female presenting to clinic today for f/u amenorrhea and concerns of R arm pain. Concerns today include:  Amenorrhea: Was seen 2/1 by Dr. Wendi Woodward, at that time had not had a period in 2 months and was otherwise normal.  She tells me her LMP 06/28/2014, at which time she bleed regularly x 5 days. At that time, TSH was found to be slightly elevated. [name] was asked to obtain a TSH/free T4 which per [name]'s report, they did, however no report has come through the clinic.    Right shoulder pain:  Has had this since Jan 2016. Pain starts in shoulder and radiates down her arm towards the thumb. States the pain is numbing in nature. Tylenol didn't help. It was suggested that she try heat by Dr. Wendi Woodward, however she hadn't gotten the heating pad. Movement makes it worse per her report. No trauma recently (did hurt it in October when she fell but states this subsequently resolved). She sleeps on her right side with her arm underneath her body which she states also makes her arm feel funny. No sharp or tingling pain radiating down her arm. This does not interfere with her daily activities. Social History: smoking status reviewed.  Patient also worried about weight gain: Does not think she's been eating more or exercising less, however unsure if she's been making more unhealthy food choices. She walks down the street 2x/per day and states she walks around in her house 7 times per day. This is no more or less than what she's been doing. Over EMR review, she has been consistently gaining weight over the last year.    ROS: All other systems reviewed and are negative.  Past Medical History Patient Active Problem List   Diagnosis Date Noted  . Amenorrhea 06/28/2014  .  Right shoulder pain 06/28/2014  . Encounter for cervical Pap smear with pelvic exam 05/12/2014  . Lumbar back pain 04/06/2014  . Health care maintenance 03/06/2014  . Acute bronchitis, bacterial 02/12/2014  . H/O liver transplant 11/23/2013  . Thrombocytopenia 03/09/2013  . Seizure disorder 03/01/2013  . Schizophrenia, paranoid type 06/25/2012    Class: Chronic  . 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 Vision loss, bilateral 03/30/2012  . 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 Menorrhagia 12/22/2010  . Unspecified vitamin D deficiency 09/13/2010  . HEMATURIA UNSPECIFIED 06/14/2010  . Rectal bleeding 06/14/2010  . ANEMIA 03/03/2010  . GERD 07/21/2008  . 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 CARDIAC FLOW MURMUR 10/16/2007  . EPILEPSY 08/23/2006  . ALLERGIC RHINITIS 07/30/2006  . Obesity 07/25/2006  . 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 Mental retardation 07/25/2006  . ASTHMA, PERSISTENT 07/25/2006  . OSTEOARTHRITIS, LOWER LEG 07/25/2006    Medications- reviewed and updated Current Outpatient Prescriptions  Medication Sig Dispense Refill  . prazosin (MINIPRESS) 1 MG capsule Take 1 mg by mouth at bedtime.    Marland Kitchen albuterol (PROVENTIL,VENTOLIN) 90 MCG/ACT inhaler Inhale 2 puffs into the lungs every 4 (four) hours as needed for wheezing.     . Azelastine HCl (ASTEPRO) 0.15 % SOLN Place 1 spray into the nose 2 (two) times daily as needed (allergic rhinitis). 30 mL 2  . azithromycin (ZITHROMAX) 250 MG tablet Take 2 tablets by mouth on  day one, then take 1 tablet daily for the next 4 days 6 tablet 0  . budesonide-formoterol (SYMBICORT) 160-4.5 MCG/ACT inhaler Inhale 1 puff into the lungs 2 (two) times daily.  1 Inhaler   . calcium-vitamin D (CALCIUM + D) 250-125 MG-UNIT per tablet Take 2 tablets by mouth 2 (two) times daily. 1 pm & 5 pm.    . carboxymethylcellulose (REFRESH TEARS) 0.5 % SOLN Place 1 drop into both eyes 4 (four) times daily as needed (for dry eyes).    . cycloSPORINE modified 100  MG CAPS Take 175 mg by mouth 2 (two) times daily.     . divalproex (DEPAKOTE) 250 MG DR tablet Take 4 tablets (1,000 mg total) by mouth at bedtime. 150 tablet 11  . magnesium oxide (MAG-OX) 400 MG tablet Take 400 mg by mouth 2 (two) times daily. 1 pm & 5 pm.    . Melatonin 3 MG CAPS Take 3 mg by mouth at bedtime.    . montelukast (SINGULAIR) 10 MG tablet Take 10 mg by mouth daily. 9 am. 30 tablet 12  . Multiple Vitamin (MULTIVITAMIN) tablet Take 1 tablet by mouth daily. 9 am.    . mycophenolate (CELLCEPT) 250 MG capsule Take 250 mg by mouth 2 (two) times daily. 9 am & 9 pm.    . omeprazole (PRILOSEC) 40 MG capsule Take 40 mg by mouth daily. 9 am.    . oxycodone (OXY-IR) 5 MG capsule Take 5-10 mg by mouth every 4 (four) hours as needed for pain.    . polyethylene glycol (MIRALAX / GLYCOLAX) packet Take 17 g by mouth daily as needed (for constipation). In water or juice    . senna-docusate (SENOKOT-S) 8.6-50 MG per tablet Take 2 tablets by mouth 2 (two) times daily.     . valGANciclovir (VALCYTE) 450 MG tablet Take 2 tablets (900 mg total) by mouth every evening. At 1700     No current facility-administered medications for this visit.    Objective: Office vital signs reviewed. BP 126/82 mmHg  Pulse 97  Temp(Src) 97.6 F (36.4 C) (Oral)  Ht 5\' 1"  (1.549 m)  Wt 181 lb (82.101 kg)  BMI 34.22 kg/m2  LMP 04/27/2014   Physical Examination:  General: Awake, alert, well- nourished, NAD  Cardio: RRR, S1S2 heard, no murmurs, rubs or gallops appreciated Pulm: CTAB, no wheezes, rhonchi or crackles noted GI: soft, NT/ND,+BS x4, no hepatomegaly, no splenomegaly Extremities: Right shoulder with some tenderness over the anterior portion of the shoulder to palpation. Full active ROM. 5/5 strength. Denies pain with empty can test and no tenderness to Hawkins test. No movements elicit the "numbness" she was referring to.  MSK: Normal gait and station  Assessment/Plan: Obesity No lifestyle changes  that the patient can pin-point leading to her continued weight gain. - Will refer to nutrition - Discussed the importance of regular cardiovascular exercise. - Will f/u with [facility] about labs to determine if thyroid dysfunction could be the cause.   Amenorrhea Resolved    Right shoulder pain No red flags on exam today- pt has full ROM.  Initially had concerns for nerve impingement at the rotator cuff from history, however this seems less likely given the patient's physical exam findings. -Stressed using heat/ice - dicussed shoulder exercises  - in the future could benefit from PT if this continues.      No orders of the defined types were placed in this encounter.    Meds ordered this encounter  Medications  .  prazosin (MINIPRESS) 1 MG capsule    Sig: Take 1 mg by mouth at bedtime.  Above medication was added to Rx list, Rx written by Shelba Flake PGY-1, [place]

## 2014-07-27 NOTE — Assessment & Plan Note (Signed)
Resolved

## 2014-07-27 NOTE — Progress Notes (Signed)
I was preceptor the day of this visit.   

## 2014-07-27 NOTE — Patient Instructions (Addendum)
Please make an appointment with our nutritionist, Dr. Jenne Campus. Please follow up with me in 1 month. I will call you with the results of your thyroids tests (this could be one of the reasons you've been gaining weight).   Shoulder Range of Motion Exercises The shoulder is the most flexible joint in the human body. Because of this it is also the most unstable joint in the body. All ages can develop shoulder problems. Early treatment of problems is necessary for a good outcome. People react to shoulder pain by decreasing the movement of the joint. After a brief period of time, the shoulder can become "frozen". This is an almost complete loss of the ability to move the damaged shoulder. Following injuries your caregivers can give you instructions on exercises to keep your range of motion (ability to move your shoulder freely), or regain it if it has been lost.  [place]: Codman's Exercise or Pendulum Exercise  This exercise may be performed in a prone (face-down) lying position or standing while leaning on a chair with the opposite arm. Its purpose is to relax the muscles in your shoulder and slowly but surely increase the range of motion and to relieve pain.  Lie on your stomach close to the side edge of the bed. Let your weak arm hang over the edge of the bed. Relax your shoulder, arm and hand. Let your shoulder blade relax and drop down.  Slowly and gently swing your arm forward and back. Do not use your neck muscles; relax them. It might be easier to have someone else gently start swinging your arm.  As pain decreases, increase your swing. To start, arm swing should begin at 15 degree angles. In time and as pain lessens, move to 30-45 degree angles. Start with swinging for about 15 seconds, and work towards swinging for 3 to 5 minutes.  This exercise may also be performed in a standing/bent over position.  Stand and hold onto a sturdy chair with  your good arm. Bend forward at the waist and bend your knees slightly to help protect your back. Relax your weak arm, let it hang limp. Relax your shoulder blade and let it drop.  Keep your shoulder relaxed and use body motion to swing your arm in small circles.  Stand up tall and relax.  Repeat motion and change direction of circles.  Start with swinging for about 30 seconds, and work towards swinging for 3 to 5 minutes. STRETCHING EXERCISES:  Lift your arm out in front of you with the elbow bent at 90 degrees. Using your other arm gently pull the elbow forward and across your body.  Bend one arm behind you with the palm facing outward. Using the other arm, hold a towel or rope and reach this arm up above your head, then bend it at the elbow to move your wrist to behind your neck. Grab the free end of the towel with the hand behind your back. Gently pull the towel up with the hand behind your neck, gradually increasing the pull on the hand behind the small of your back. Then, gradually pull down with the hand behind the small of your back. This will pull the hand and arm behind your neck further. Both shoulders will have an increased range of motion with repetition of this exercise. STRENGTHENING EXERCISES:  Standing with your arm at your side and straight out from your shoulder with the elbow bent at 90  degrees, hold onto a small weight and slowly raise your hand so it points straight up in the air. Repeat this five times to begin with, and gradually increase to ten times. Do this four times per day. As you grow stronger you can gradually increase the weight.  Repeat the above exercise, only this time using an elastic band. Start with your hand up in the air and pull down until your hand is by your side. As you grow stronger, gradually increase the amount you pull by increasing the number or size of the elastic bands. Use the same amount of repetitions.  Standing with your hand at your side and  holding onto a weight, gradually lift the hand in front of you until it is over your head. Do the same also with the hand remaining at your side and lift the hand away from your body until it is again over your head. Repeat this five times to begin with, and gradually increase to ten times. Do this four times per day. As you grow stronger you can gradually increase the weight. Document Released: 02/10/2003 Document Revised: 05/19/2013 Document Reviewed: 05/14/2005 [facility] Patient Information 2015 [place], [place]. This information is not intended to replace advice given to you by your health care provider. Make sure you discuss any questions you have with your health care provider.

## 2014-07-27 NOTE — Assessment & Plan Note (Signed)
No red flags on exam today- pt has full ROM.  Initially had concerns for nerve impingement at the rotator cuff from history, however this seems less likely given the patient's physical exam findings. -Stressed using heat/ice - dicussed shoulder exercises  - in the future could benefit from PT if this continues.

## 2014-07-28 ENCOUNTER — Telehealth: Payer: Self-pay | Admitting: Adult Health

## 2014-07-28 NOTE — Telephone Encounter (Signed)
Patient had lab work completed through [employer]. I got the valproic acid level which was 57 but not the ammonia level. Can you call [name] and see if they drew this and if so what the results were?

## 2014-07-28 NOTE — Telephone Encounter (Signed)
Called kay back because I still had not received.

## 2014-07-28 NOTE — Telephone Encounter (Signed)
Ammonia level was 37 with reference range 16-53. This is a normal result. I will recheck ammonia at the next visit.

## 2014-07-28 NOTE — Telephone Encounter (Signed)
Ammonia level has been faxed. Megan in your in box.

## 2014-07-28 NOTE — Telephone Encounter (Signed)
Spoke with [name] at [facility] [facility] she is faxing labs.

## 2014-07-29 ENCOUNTER — Ambulatory Visit: Payer: Self-pay | Admitting: Family Medicine

## 2014-08-24 ENCOUNTER — Telehealth: Payer: Self-pay | Admitting: Family Medicine

## 2014-08-24 NOTE — Telephone Encounter (Signed)
Wants to find out about her potassium level

## 2014-08-25 NOTE — Telephone Encounter (Signed)
Patient informed of normal potassium drawn on 2/4, expressed understanding.

## 2014-09-02 ENCOUNTER — Encounter: Payer: Self-pay | Admitting: Family Medicine

## 2014-09-02 ENCOUNTER — Ambulatory Visit (INDEPENDENT_AMBULATORY_CARE_PROVIDER_SITE_OTHER): Payer: Medicaid Other | Admitting: Family Medicine

## 2014-09-02 VITALS — Ht 61.0 in | Wt 176.4 lb

## 2014-09-02 DIAGNOSIS — E669 Obesity, unspecified: Secondary | ICD-10-CM | POA: Diagnosis present

## 2014-09-02 NOTE — Patient Instructions (Signed)
Breakfast ideas:  Try making your own eggs instead of the Eggo sandwich. 2 eggs or egg beaters will give you more protein, less calories, less sodium, less fat.  2 eggs, 1-2 slices of whole wheat bread. Yogurt and fruit (can add nuts to the yogurt to get good fats). Can also use peanut butter on the fruits you normally like to eat, or put peanut butter on some toast.  Lunch ideas:  Try to include more vegetables if you can, or at daycare ask for more vegetables.  On Tuesdays try to bring your own lunch to church for Bible study. Make sure to include a vegetable (baby carrots, green peppers, cucumbers, cherry tomatoes)   Dinner ideas:  Try to limit fish sticks to 8 instead of 12 when you eat them.    Continue drinking plenty of water, that should be your main drink as it already sounds like it is. Good job!

## 2014-09-02 NOTE — Progress Notes (Signed)
Pt identifies goals of visit as "losing 10 lbs", "eating healthier"  Usual eating pattern includes 3 meals and 2 snacks per day. Frequent foods and beverages include eggo breakfast sandwich, tuna sandwich, fruits (pear, applesauce). Drinks mostly water, some milk, V8 Splash (both diet and regular), coffee at daycare.  Avoided foods include  Chicken breast (but still eats), string beans.   Usual physical activity includes daily walking laps around her house 3 times a day, walking down the street to visit family.  Identifies Tuesday (Bible study) as a day where she does not eat lunch typically (but does snack on cheez it crackers, healthy choice soup). Monday, Thursday, and Friday she has daycare where lunch is served. She has a home helper come in and cook her dinner normally around 4-5 PM.  24-hr recall: (Up at 4 AM) B (8 AM)-   Frozen Breakfast sandwich (Eggo waffle, egg, sausage, cheese), 1 pear. Bottle of water. Snk (1030 AM)-  Navel orange  L (1 PM)-  Tuna sandwich (miracle whip), applesauce. Water. Snk ( PM)-   D (4-5 PM)-  Fish sticks (12), cabbage (4 cups), mac & cheese (2 cups). 2 apple sauce (1 cup total). Water. Snk ( PM)-   Typical day? Yes.      Handouts given during visit include:  AVS  Diet change recommendations  Demonstrated degree of understanding via:  Teach Back  Face to face time with patient 40 minutes, spent counseling regarding management of disease and education.

## 2014-09-02 NOTE — Progress Notes (Signed)
I was the preceptor on the day of this visit.   Mackinley Kiehn MD  

## 2014-09-03 ENCOUNTER — Ambulatory Visit (INDEPENDENT_AMBULATORY_CARE_PROVIDER_SITE_OTHER): Payer: Medicaid Other | Admitting: Family Medicine

## 2014-09-03 ENCOUNTER — Encounter: Payer: Self-pay | Admitting: Family Medicine

## 2014-09-03 VITALS — BP 132/77 | HR 92 | Temp 98.1°F | Ht 61.0 in | Wt 179.7 lb

## 2014-09-03 DIAGNOSIS — M25511 Pain in right shoulder: Secondary | ICD-10-CM | POA: Diagnosis not present

## 2014-09-03 DIAGNOSIS — E669 Obesity, unspecified: Secondary | ICD-10-CM

## 2014-09-03 DIAGNOSIS — N912 Amenorrhea, unspecified: Secondary | ICD-10-CM | POA: Diagnosis not present

## 2014-09-03 DIAGNOSIS — R946 Abnormal results of thyroid function studies: Secondary | ICD-10-CM

## 2014-09-03 DIAGNOSIS — R7989 Other specified abnormal findings of blood chemistry: Secondary | ICD-10-CM

## 2014-09-03 LAB — TSH: TSH: 3.524 u[IU]/mL (ref 0.350–4.500)

## 2014-09-03 LAB — T4, FREE: FREE T4: 1 ng/dL (ref 0.80–1.80)

## 2014-09-03 NOTE — Progress Notes (Signed)
Patient ID: [name], female   DOB: 1976/01/16, 39 y.o.   MRN: [id number]    Subjective: CC: obesity follow up HPI: Patient is a 39 y.o. female with a past medical history of primary biliary cirrhosis s/p liver transplant in 06/2013, recurrent seizure like activity, mental retardation and paranoid type schizophrenia presenting to clinic today for a follow up on her weight and right shoulder pain.  Weight concern/Obesity: Patient met with Dr. Jenne Campus yesterday and is interested in seeing how she's doing with her weight. She has not yet implemented many of things that were discussed (understandably). She tells me she's now eating [place] burgers instead of sirloin burgers. Eating frozen chicken breast (frying more often than baking). Eating more veges and fruits daily, however can't quantify. She walks around her house and down the st. Feels like there are strangers in her neighborhood so she's worried about walking outside late. Unsure if she can get someone to go walking with her. She hands me a paper from her transplant doctor that states her goal is to lose 10 pounds. She notes she continues to feel tired often. TSH from 06/28/14 was slightly elevated at 5.429.  Right shoulder pain: Improved from patient report, however occasionally has numbness of her arm when she sleeps on it too long. No radiation of pain.  Social History: Patient does not smoke  Health Maintenance: up to date  ROS: All other systems reviewed and are negative.  Past Medical History Patient Active Problem List   Diagnosis Date Noted  . Amenorrhea 06/28/2014  . Right shoulder pain 06/28/2014  . Encounter for cervical Pap smear with pelvic exam 05/12/2014  . Lumbar back pain 04/06/2014  . Health care maintenance 03/06/2014  . Acute bronchitis, bacterial 02/12/2014  . H/O liver transplant 11/23/2013  . Thrombocytopenia 03/09/2013  . Seizure disorder 03/01/2013  . Schizophrenia, paranoid type 06/25/2012    Class: Chronic   . 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 Vision loss, bilateral 03/30/2012  . 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 Menorrhagia 12/22/2010  . Unspecified vitamin D deficiency 09/13/2010  . HEMATURIA UNSPECIFIED 06/14/2010  . Rectal bleeding 06/14/2010  . ANEMIA 03/03/2010  . GERD 07/21/2008  . 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 CARDIAC FLOW MURMUR 10/16/2007  . EPILEPSY 08/23/2006  . ALLERGIC RHINITIS 07/30/2006  . Obesity 07/25/2006  . 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 Mental retardation 07/25/2006  . ASTHMA, PERSISTENT 07/25/2006  . OSTEOARTHRITIS, LOWER LEG 07/25/2006    Medications- reviewed and updated Current Outpatient Prescriptions  Medication Sig Dispense Refill  . albuterol (PROVENTIL,VENTOLIN) 90 MCG/ACT inhaler Inhale 2 puffs into the lungs every 4 (four) hours as needed for wheezing.     . Azelastine HCl (ASTEPRO) 0.15 % SOLN Place 1 spray into the nose 2 (two) times daily as needed (allergic rhinitis). 30 mL 2  . budesonide-formoterol (SYMBICORT) 160-4.5 MCG/ACT inhaler Inhale 1 puff into the lungs 2 (two) times daily.  1 Inhaler   . calcium-vitamin D (CALCIUM + D) 250-125 MG-UNIT per tablet Take 2 tablets by mouth 2 (two) times daily. 1 pm & 5 pm.    . carboxymethylcellulose (REFRESH TEARS) 0.5 % SOLN Place 1 drop into both eyes 4 (four) times daily as needed (for dry eyes).    . cycloSPORINE modified 100 MG CAPS Take 175 mg by mouth 2 (two) times daily.     . divalproex (DEPAKOTE) 250 MG DR tablet Take 4 tablets (1,000 mg total) by mouth at bedtime. 150 tablet 11  . magnesium  oxide (MAG-OX) 400 MG tablet Take 400 mg by mouth 2 (two) times daily. 1 pm & 5 pm.    . Melatonin 3 MG CAPS Take 3 mg by mouth at bedtime.    . montelukast (SINGULAIR) 10 MG tablet Take 10 mg by mouth daily. 9 am. 30 tablet 12  . Multiple Vitamin (MULTIVITAMIN) tablet Take 1 tablet by mouth daily. 9 am.    . mycophenolate (CELLCEPT) 250 MG capsule Take 250 mg by mouth 2 (two) times daily. 9 am  & 9 pm.    . omeprazole (PRILOSEC) 40 MG capsule Take 40 mg by mouth daily. 9 am.    . polyethylene glycol (MIRALAX / GLYCOLAX) packet Take 17 g by mouth daily as needed (for constipation). In water or juice    . prazosin (MINIPRESS) 1 MG capsule Take 1 mg by mouth at bedtime.    . risperiDONE (RISPERDAL) 3 MG tablet Take 3 mg by mouth at bedtime.    . senna-docusate (SENOKOT-S) 8.6-50 MG per tablet Take 2 tablets by mouth 2 (two) times daily.      No current facility-administered medications for this visit.    Objective: Office vital signs reviewed. BP 132/77 mmHg  Pulse 92  Temp(Src) 98.1 F (36.7 C) (Oral)  Ht '5\' 1"'  (1.549 m)  Wt 179 lb 11.2 oz (81.511 kg)  BMI 33.97 kg/m2   Physical Examination:  General: Awake, alert,  obese, NAD. Pleasant. ENMT:  TMs intact, normal light reflex, no erythema, no bulging. Nasal turbinates moist. MMM, Oropharynx clear without erythema or tonsillar exudate/hypertrophy. No thyromegaly.  Eyes: Conjunctiva non-injected.  Cardio: RRR, III/VI systolic murmur noted.  Pulm: No increased WOB.  CTAB, without wheezes, rhonchi or crackles noted.  MSK: Normal gait and station. No tenderness to palpation over the shoulders bilaterally. Full active range of motion. 5/5 strength in the UE bilaterally. Skin: dry, intact, no rashes or lesions Neuro: A&Ox3. Normal patellar reflexes.  Assessment/Plan: Right shoulder pain Improved. Will continue to monitor. Will stress the importance of heat/ice in the future if she has this pain again.   Obesity Patient met with the nutritionist yesterday and feels like she can improve her diet (no barriers). Will attempt to find someone to walk with .  - Last TSH elevated at 5. Will get TSH and T 4 today. - Follow up in 3 months or sooner as necessary.     No orders of the defined types were placed in this encounter.    No orders of the defined types were placed in this encounter.    [name] PGY-1, [place]

## 2014-09-03 NOTE — Assessment & Plan Note (Signed)
Improved. Will continue to monitor. Will stress the importance of heat/ice in the future if she has this pain again.

## 2014-09-03 NOTE — Assessment & Plan Note (Signed)
Patient met with the nutritionist yesterday and feels like she can improve her diet (no barriers). Will attempt to find someone to walk with .  - Last TSH elevated at 5. Will get TSH and T 4 today. - Follow up in 3 months or sooner as necessary.

## 2014-09-03 NOTE — Progress Notes (Signed)
I was the preceptor on the day of this visit.   Annais Crafts MD  

## 2014-09-03 NOTE — Patient Instructions (Addendum)
It was nice to see you again! Your blood pressure looks great! Continue to try to walk more frequently: at least 15-30 minutes 4-5 times per week. Try to eat more fruits and vegetables. Try to eat more baked foods than fried foods. I think with time, we can meet your goal of losing 10 pound! Follow up with me in 3 months or sooner as necessary.

## 2014-09-30 ENCOUNTER — Encounter: Payer: Self-pay | Admitting: Family Medicine

## 2014-09-30 ENCOUNTER — Ambulatory Visit (INDEPENDENT_AMBULATORY_CARE_PROVIDER_SITE_OTHER): Payer: Medicaid Other | Admitting: Family Medicine

## 2014-09-30 DIAGNOSIS — E669 Obesity, unspecified: Secondary | ICD-10-CM | POA: Diagnosis present

## 2014-09-30 NOTE — Progress Notes (Signed)
Patient ID: [name], female   DOB: [birth year], 39 y.o.   MRN: [id number] Pt identifies goals of visit as "losing 10 lbs", "eating healthier"  Usual eating pattern includes 3 meals a day  - 1 snack at night sometimes - Sometimes a snack in the afternoon.   Frequent foods and beverages include tuna sandwich, fruits. Drinks mostly water Usual physical activity includes daily walking laps around her house 3 times a day, walking down the street to visit family.  Identifies Tuesday (Bible study) as a day where she does not eat lunch typically (but does snack on cheez it crackers, healthy choice soup). Monday, Thursday, and Friday she has daycare where lunch is served. She has a home helper come in and cook her dinner normally around 4-5 PM. Ht 5\' 1"  (1.549 m)  Wt 183 lb 8 oz (83.235 kg)  BMI 34.69 kg/m2  24-hr recall: (Up at 5 AM) B (7:30 AM)- Cereal  (2 % milk, small box frosted mini wheats), Yogurt cup (Yoplait strawberry lite yogurt). Water (1/2 bottle water). Snk: no snack  L (12:00 PM)-  Potato salad 1 cup. Marshmallow, strawberry filled green pudding 1.5 cups and  blueberry cream cobbler, with whip cream.  Snk ( 1 PM)- "Bowl" full of cherry tomatos   D (4-5 PM)- Tuna fish sandwich with kraft single cheese x1, white wheat bread (2). Healthy Choice Chicken noodle soup (1 can). Sweet potato, butter and cinnamon. Water (1 bottle) . Snk ( PM)- NA Typical day? No.  Decided not to eat a dinner snack because she weighed herself and she weighed 185 lbs.     Handouts given during visit include:  AVS  Diet change recommendations  Demonstrated degree of understanding via:  Teach Back Goals: - breakfast continue trying to eat healthy options such as eggs (home cooked), egg beaters, yogurt is good.  - Lunch: Continue with the fresh vegetables, taking your packed lunch to Bible study.  - Dinner: Great job limiting the fish sticks, Try to have un-breaded fish for dinner, always eat a vegetable  with dinner.  - Drink 4 bottles of water before 3 pm everyday. Continue drink some water after as well.   Face to face time with patient 40 minutes, spent counseling regarding management of disease and education.

## 2014-09-30 NOTE — Patient Instructions (Signed)
Goals: - breakfast continue trying to eat healthy options such as eggs (home cooked), egg beaters, yogurt is good.  - Lunch: Continue with the fresh vegetables, taking your packed lunch to Bible study.  - Dinner: Great job limiting the fish sticks, Try to have un-breaded fish for dinner, always eat a vegetable with dinner.  - Drink 4 bottles of water before 3 pm everyday. Continue drink some water after as well.  - try to eat dessert only 2 times a week  Your weight went up from 179.7 to 183.8 lbs. Communicate with  your aide and uncle to continue to help you  make healthy choices available to you and preparing more vegetables (healthy meals). We would like to see your weight decrease next time we see you on June 2 at 9 am (60 minutes).

## 2014-10-01 NOTE — Progress Notes (Signed)
I was preceptor the day of this visit.   

## 2014-10-10 IMAGING — CT CT HEAD W/O CM
2 series · 16 of 30 positions shown, 20 images · non-contrast
Comparison: No priors.

CLINICAL DATA: Altered mental status.

CT HEAD WITHOUT CONTRAST
TECHNIQUE: Contiguous axial images were obtained from the base of
the skull through the vertex without contrast.

[Series 2: head w/o · axial · non-contrast · 0.43mm/px · z∈[-102,+18]mm · 13 of 30 slices shown, 17 images]
[im 3/30  brain]
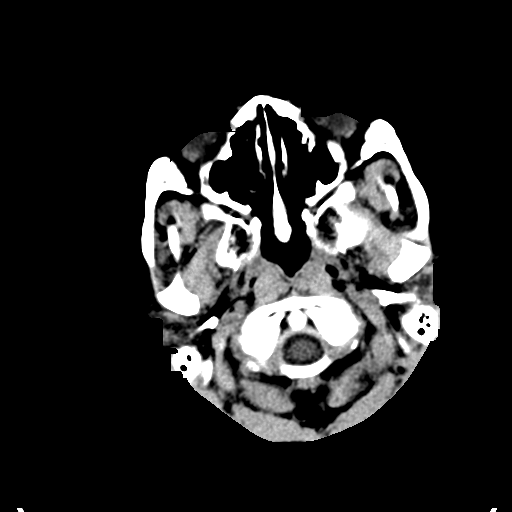
[im 3/30  bone]
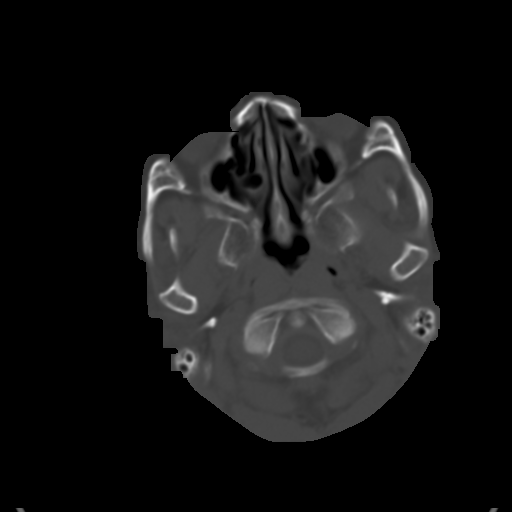
[im 5/30  brain]
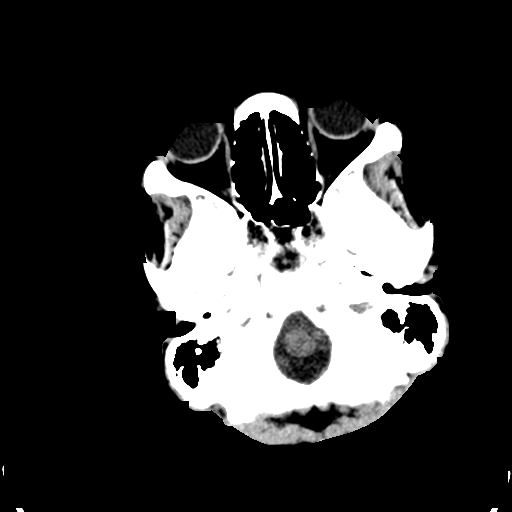
[im 7/30  brain]
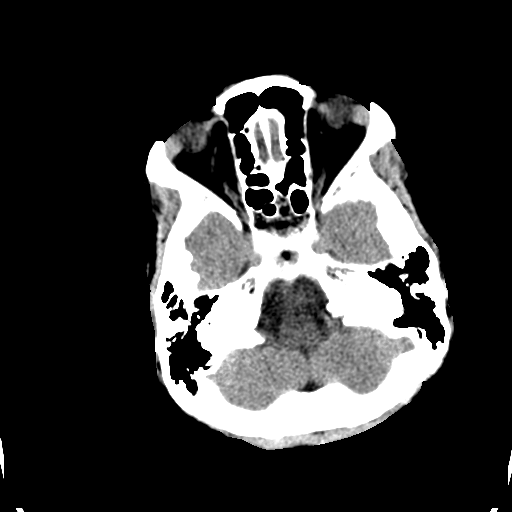
[im 9/30  brain]
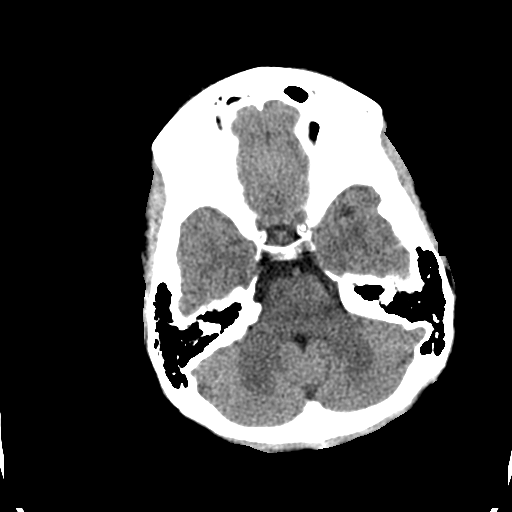
[im 11/30  brain]
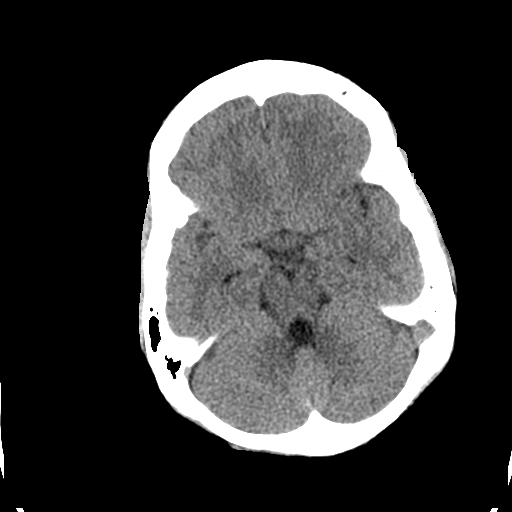
[im 11/30  bone]
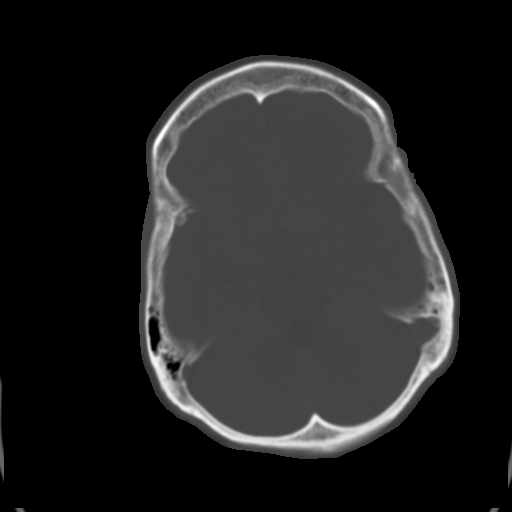
[im 13/30  brain]
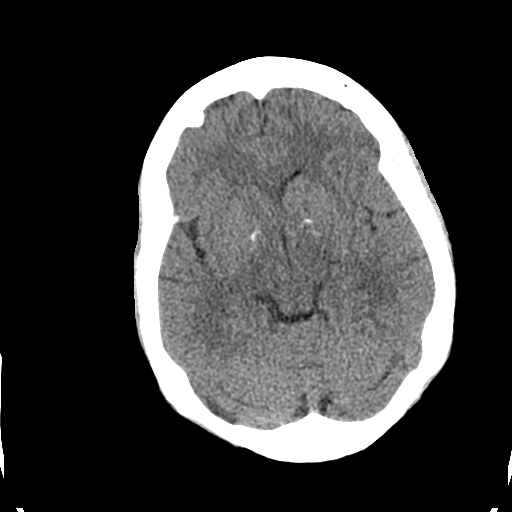
[im 15/30  brain]
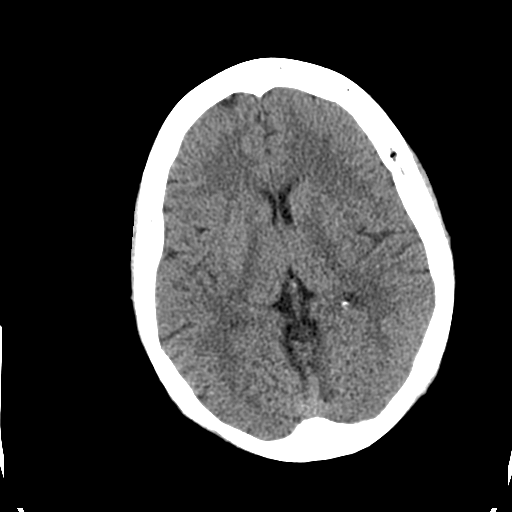
[im 17/30  brain]
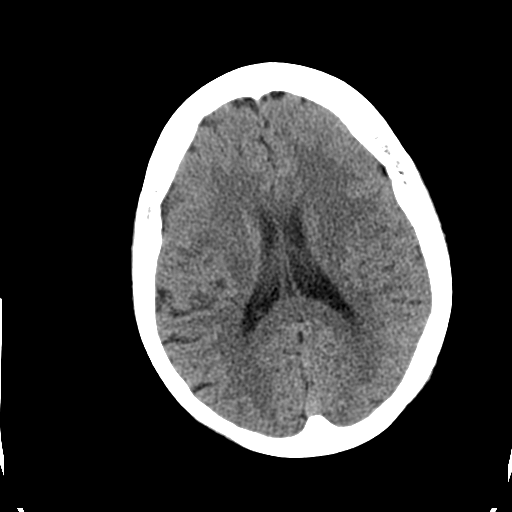
[im 19/30  brain]
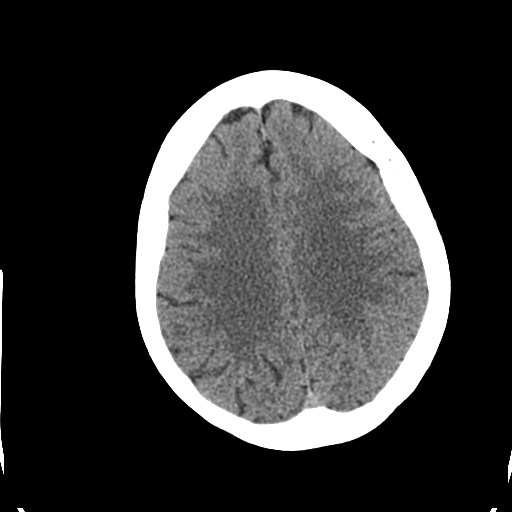
[im 19/30  bone]
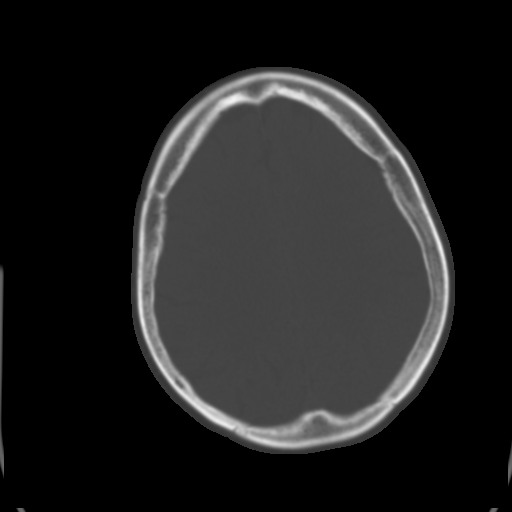
[im 21/30  brain]
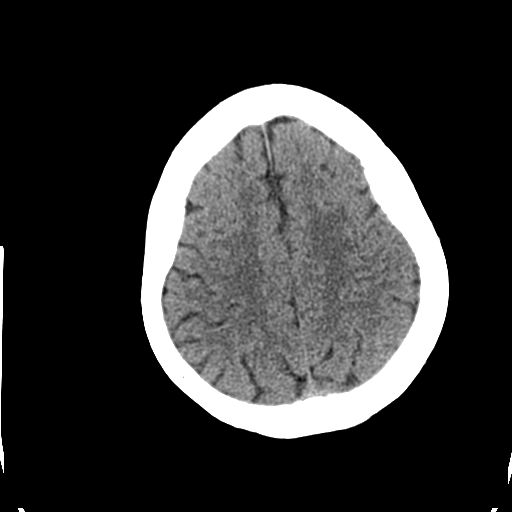
[im 23/30  brain]
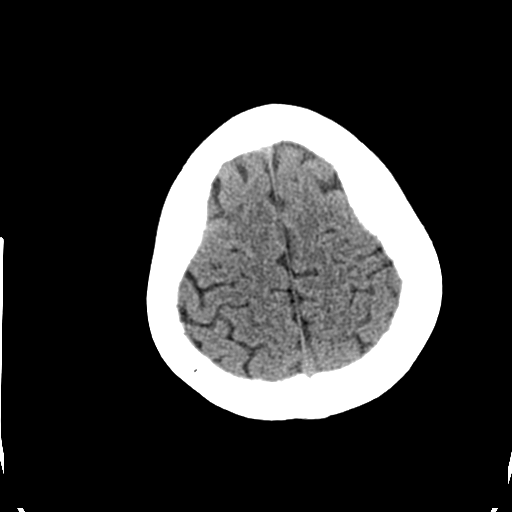
[im 25/30  brain]
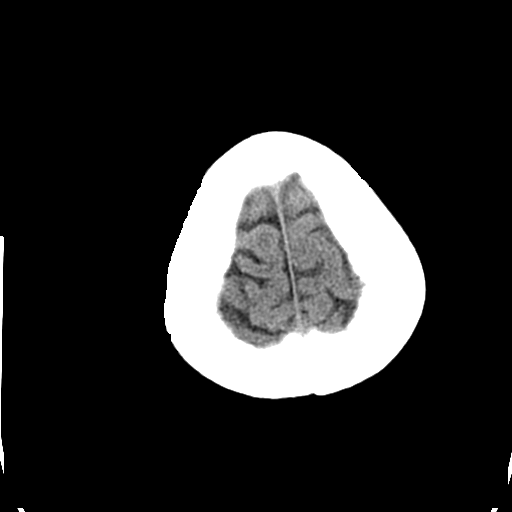
[im 27/30  brain]
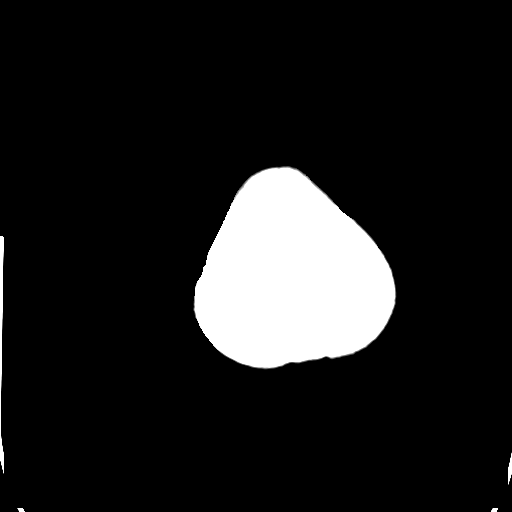
[im 27/30  bone]
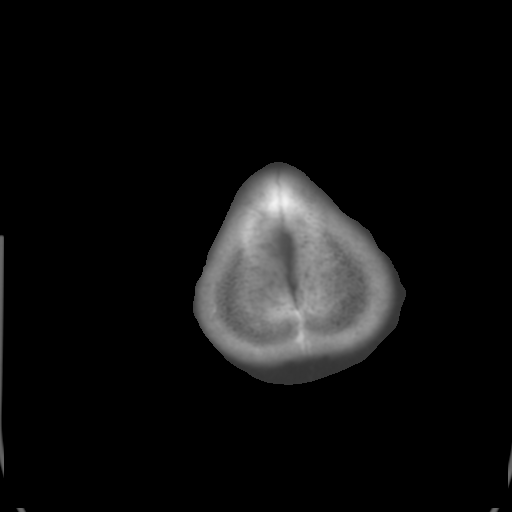

[Series 3: bone windows · axial · 0.43mm/px · z∈[-102,-62]mm · 3 of 30 slices shown]
[im 3/30  bone]
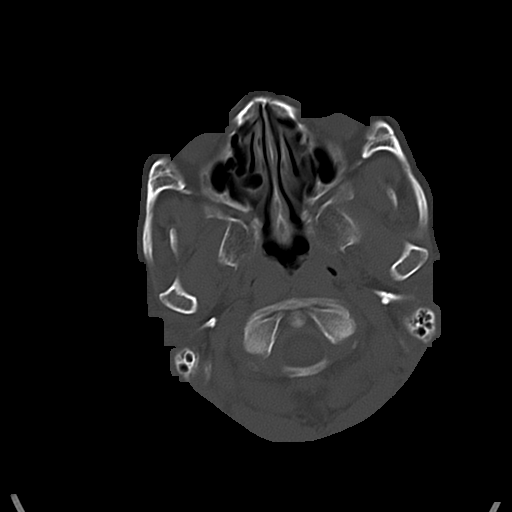
[im 7/30  bone]
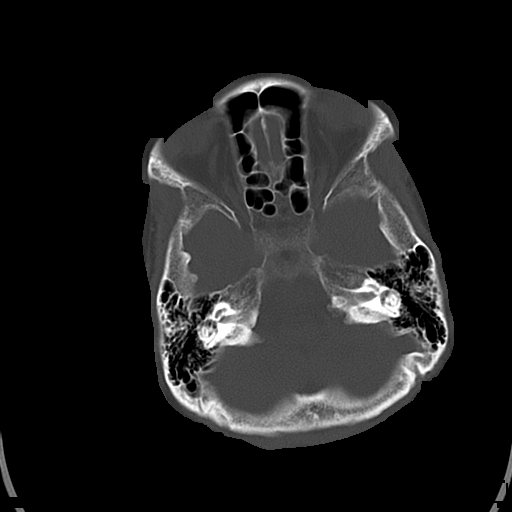
[im 11/30  bone]
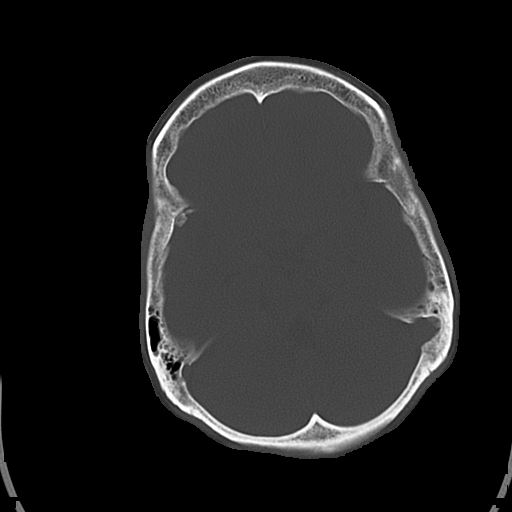

[16 of 30 positions shown; findings below may reference images not displayed]

FINDINGS: Physiologic calcifications in the basal ganglia
bilaterally.  No acute intracranial abnormalities.  Specifically,
no definite signs of acute/subacute cerebral ischemia, no acute
intracranial hemorrhage, no mass, mass effect, hydrocephalus or
abnormal intra or extra-axial fluid collections.  Visualized
paranasal sinuses and mastoids are well pneumatized.  No acute
displaced skull fractures are noted.
IMPRESSION: 1.  No acute intracranial abnormalities.
2.  Physiologic calcifications in the basal ganglia bilaterally.

## 2014-10-15 ENCOUNTER — Ambulatory Visit (INDEPENDENT_AMBULATORY_CARE_PROVIDER_SITE_OTHER): Payer: Medicaid Other | Admitting: Family Medicine

## 2014-10-15 ENCOUNTER — Encounter: Payer: Self-pay | Admitting: Family Medicine

## 2014-10-15 VITALS — BP 121/76 | Ht 59.0 in | Wt 184.0 lb

## 2014-10-15 DIAGNOSIS — M25571 Pain in right ankle and joints of right foot: Secondary | ICD-10-CM

## 2014-10-15 DIAGNOSIS — R269 Unspecified abnormalities of gait and mobility: Secondary | ICD-10-CM | POA: Insufficient documentation

## 2014-10-15 DIAGNOSIS — Q6650 Congenital pes planus, unspecified foot: Secondary | ICD-10-CM

## 2014-10-15 DIAGNOSIS — M25572 Pain in left ankle and joints of left foot: Secondary | ICD-10-CM

## 2014-10-15 NOTE — Progress Notes (Signed)
   Subjective:    Patient ID: [name], female    DOB: 1975/08/12, 39 y.o.   MRN: [id number]  HPI  Has noticed left ankle pain increasing over the last month. She does a lot of walking. She also noticed her orthotics are getting pretty worn. In the past these have significantly helped her.  Review of Systems No unusual ankle redness or significant swelling. No fever, sweats, chills. No numbness in her feet.    Objective:   Physical Exam  Vital signs are reviewed GEN.: Well-developed female no acute distress ANKLE: Bilaterally anterior drawer is normal. Ligamentously intact to eversion and inversion. Mild tenderness to palpation over the medial ankle ligaments. Significant pes planus. Left medial foot collapse on stance.  Patient was fitted for a : standard, cushioned, semi-rigid orthotic. The orthotic was heated, placed on the orthotic stand. The patient was positioned in subtalar neutral position and 10 degrees of ankle dorsiflexion in a weight bearing stance on the heated orthotic blank After completion of molding, a stable base was applied to the orthotic blank. The blank was ground to a stable position for weight bearing. Blank: 7 standard Base: white Posting:n/a  Face to face time spent in evaluation, measurement and manufacture of custom molded orthotic was 40 minutes.     Assessment & Plan:

## 2014-10-26 ENCOUNTER — Telehealth: Payer: Self-pay | Admitting: Clinical

## 2014-10-26 NOTE — Telephone Encounter (Signed)
CSW received a call from pts case worker inquiring about the [facility] request she faxed to PCP. CSW informed case worker that CSW was not made aware of request however CSW could complete FL2 and place in PCP box for review and signature.  CSW to fax FL2 to case worker once signed.  Hunt Oris, MSW, [place]

## 2014-10-26 NOTE — Telephone Encounter (Signed)
PCP was not aware of previous FL2 paperwork. Signed and placed on [employer], LCSW, desk.  Thanks, [name], MD [facility] Resident  10/26/2014, 4:41 PM

## 2014-10-27 ENCOUNTER — Telehealth: Payer: Self-pay | Admitting: Clinical

## 2014-10-27 NOTE — Telephone Encounter (Signed)
CSW faxed FL2 and confirmed it was received by Ms. Fullmore, DSS.  Hunt Oris, MSW, [place]

## 2014-10-28 ENCOUNTER — Ambulatory Visit (INDEPENDENT_AMBULATORY_CARE_PROVIDER_SITE_OTHER): Payer: Medicaid Other | Admitting: Family Medicine

## 2014-10-28 ENCOUNTER — Encounter: Payer: Self-pay | Admitting: Family Medicine

## 2014-10-28 VITALS — Ht 60.0 in | Wt 184.8 lb

## 2014-10-28 DIAGNOSIS — E669 Obesity, unspecified: Secondary | ICD-10-CM

## 2014-10-28 NOTE — Patient Instructions (Signed)
1. Remembering to have a vegetable at lunch and dinner on half the plate. 2. Limit carbs to a quarter of plate. 3. Great job adding vegetables to dinner. 4. Replace canned vegetables with frozen vegetables for most meals. 5. Switch to 1% milk.  6. Finish bottle of calcium and vitamin D and replace with separate Vitamin D 1000-2000 IU at a minimum.

## 2014-10-28 NOTE — Progress Notes (Signed)
Patient ID: NECHAMA ESCUTIA, female   DOB: Oct 10, 1975, 39 y.o.   MRN: [id number]  Nutrition Clinic Visit  Goal for coming to nutrition clinic: trying to get weight down before next month when she follows up with her transplant doctor. Transplant doctor wants her to lose 10 lbs.   Physical activity: States she is walking 2 times down the street and 13 times around house daily. States she does this for 2 hours everyday after dinner.   Progress since last visit: veggie burger twice a week, eating more fruits and veggies 5 days a week at dinner 4 cups full, drinking more water 4 bottles during the day and 2 more at night, Also eats grits and oatmeal for breakfast. Sometimes eggs and french toast for dinner.   Notes she infrequently checks CGBs. No diagnosis of diabetes. Has done this intermittently since having liver transplant. CBGs typically 134-169.  24-hr recall: (Up at 4 AM) B (7 AM)-  Crackers x10 (saltines with no salt), yogurt (blueberry yoplait), bottle of water Snk -   none L (12 PM)-  Hot dogs x3 with bun chili and slaw, bottle of water, 2 mini peach donuts prepackaged from store Snk -   none D (4 PM)-  Black bean burger with bun and cheese and mayo (1 tsp), green beans whole can salted, apple sauce, bottle of    water Snk (9 PM)-  Crackers x8 (saltines unsalted) Typical day? No. Usually eats more fruit.   Recommendations:  1. Remembering to have a vegetable at lunch and dinner on half the plate. 2. Limit carbs to a quarter of plate. 3. Great job adding vegetables to dinner. 4. Replace canned vegetables with frozen vegetables for most meals. 5. Switch to 1% milk.  6. Finish bottle of calcium and vitamin D and replace with separate Vitamin D 1000-2000 IU at a minimum.  Follow up 12/09/14 at 9 am.  Tommi Rumps, MD Family Medicine Resident PGY-3   60 minutes of direct face-to-face time was spent with the patient with at least 50% of this time spent counseling the patient.

## 2014-10-29 NOTE — Progress Notes (Signed)
I was preceptor the day of this visit.   

## 2014-11-02 ENCOUNTER — Telehealth: Payer: Self-pay | Admitting: *Deleted

## 2014-11-02 NOTE — Telephone Encounter (Signed)
Pt called stating her blood sugar level was 197 at 12:05 PM.  Early this morning it was 113. Pt stated she did eat mini pancakes for breakfast.  She was going to eat some watermelon now.  Pt asked if she was taking any medication for diabetes; pt stated no. She just checks her levels from time to time. Will forward to PCP for further advise.  Derl Barrow, RN

## 2014-11-08 NOTE — Telephone Encounter (Signed)
Spoke to [name]. CBGs ranging 100s-130s. Will bring in a list of fasting glucoses to her next appt. Additionally, she's up a few pounds. States her transplant MD was worried she could be fluid overloaded and suggested she be seen by PCP. Patient without LE, SOB, or chest pain.  Will make appt in clinic to be seen- will bring in fasting blood sugars.  Kathrine Cords, MD

## 2014-11-16 ENCOUNTER — Telehealth: Payer: Self-pay | Admitting: Family Medicine

## 2014-11-16 NOTE — Telephone Encounter (Signed)
Form placed in PCP box 

## 2014-11-16 NOTE — Telephone Encounter (Signed)
Form for [place] dropped off to be filled out.  Please call when completed.

## 2014-11-19 NOTE — Telephone Encounter (Signed)
Pt's aunt Kalman Shan inform that form is complete and ready for pick up.  Derl Barrow, RN

## 2014-11-19 NOTE — Telephone Encounter (Signed)
Form filled out, in [name]'s box.  Thanks, [employer]

## 2014-12-01 ENCOUNTER — Ambulatory Visit: Payer: Self-pay | Admitting: Family Medicine

## 2014-12-07 ENCOUNTER — Ambulatory Visit (INDEPENDENT_AMBULATORY_CARE_PROVIDER_SITE_OTHER): Payer: Medicaid Other | Admitting: Adult Health

## 2014-12-07 ENCOUNTER — Telehealth: Payer: Self-pay | Admitting: Neurology

## 2014-12-07 ENCOUNTER — Encounter: Payer: Self-pay | Admitting: Adult Health

## 2014-12-07 VITALS — BP 119/83 | HR 97 | Ht 60.0 in | Wt 190.0 lb

## 2014-12-07 DIAGNOSIS — R569 Unspecified convulsions: Secondary | ICD-10-CM

## 2014-12-07 DIAGNOSIS — Z5181 Encounter for therapeutic drug level monitoring: Secondary | ICD-10-CM | POA: Diagnosis not present

## 2014-12-07 MED ORDER — DIVALPROEX SODIUM 250 MG PO DR TAB: 1000.0000 mg | DELAYED_RELEASE_TABLET | Freq: Every day | ORAL | Status: DC

## 2014-12-07 NOTE — Patient Instructions (Signed)
Continue Depakote 4 tablets at bedtime I will check blood work today.  If you have any seizure events please let us know.

## 2014-12-07 NOTE — Telephone Encounter (Signed)
Rx has been sent.  Receipt confirmed by pharmacy.  I called back to advise. Patient is aware.

## 2014-12-07 NOTE — Progress Notes (Signed)
PATIENT: [name] DOB: [birth year]  REASON FOR VISIT: follow up- seizures HISTORY FROM: patient  HISTORY OF PRESENT ILLNESS: [name] is a 39 year old female with a history of seizures. She returns today for follow-up. She is currently taking Depakote 250 mg 4 tablets at bedtime. She denies any seizure events. She continues to live alone. She is able to complete all ADLs independently. Denies any changes with her gait or balance. She states that her uncle checks on her frequently and she also has an aide that comes in and helps in the afternoons. Denies any new neurological symptoms. She returns today for an evaluation.  HISTORY 06/08/14: [name] is a 39 year old female with a history of seizures. She returns today for follow-up. She is currently taking Depakote 250 mg 5 tablets at bedtime. She states that she hasn't had a seizure since the last visit. The patient does have a mild history of mental retardation and she is alone today. Patient does not operate a motor vehicle. She states that she lives alone but her uncle lives beside of her. She has an Aid that helps her in the afternoons and goes to daycare on Monday, Thursday and Friday. Denies any difficulty with gait or balance.   HISTORY 03/23/14 [name]): [name], 39 year old black female returns today for followup of epilepsy. [name]. She has a history of mild mental retardation, and long-standing history of seizures. She was well-controlled on Depakote and Tegretol extended release. But she has developed liver disease, was diagnosed with primary biliary cirrhosis,Antieptical medication was changed since February 2013, she is now taking Vimpat 100 mg 2 tabs twice a day, Keppra 500 mg 3 tabs daily. She has no significant seizure activities while taking Vimpat, and Keppra.She lives alone, uncles lives next door, she attends adult daycare regularly.She underwent liver transplantation in February 2015, doing very well, on  polypharmacy treatment, this including CellCept 1000 mg twice a day, Prograft 4 mg twice a day,For her seizure, she is now taking deapkote ER 250mg  5 tabs qhs, she is having seizure about once amonth, it usually happens at nighttime, she was noted to have nighttime extremity jumping movement, tongue biting when she woke up, she also complains of mild gait difficullty,  UPDATE Oct 27th 2015:I have talked with [name] liver transplant cordinator, reported, 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, at [facility], Keppra, Vimpat cause worsening behavior issue, she had a hallucinations, paranoid to the point of almost institutionalized, her mood disorder eventually was under control with Depakote ER 250 mg 5 tablets each day, [name] continues to complains that she had a 2 episode of working up with blood in her mouth, has true to her tongue, one in July, the second one was September 2015, overall she is tolerating the medication well, Depakote level was 63 in February 23 2014, EEG was normal.Repeat laboratory evaluation in March 09 2014, showed decreased white count 1.6, she is receiving weekly laboratory evaluation by [name] transplant team, I have called coordinator again, is aware of her leukopenia, she is on lower dose of immunosuppressive treatment, now taking cyclosporine 200 mg twice a day, CellCept 250 mg twice a day,She has personal aid every evening  REVIEW OF SYSTEMS: Out of a complete 14 system review of symptoms, the patient complains only of the following symptoms, and all other reviewed systems are negative.  Runny nose, cough, abdominal pain, leg swelling  ALLERGIES: Allergies  Allergen Reactions  . Tramadol Hcl Itching and Nausea  And Vomiting    Causes SEIZURES  . Aspirin Nausea And Vomiting    Due to Stomach ulcer  . Penicillins Itching, Swelling and Rash    Breakout     . Pseudoephedrine Itching and Swelling    Tongue swelling  . Sulfa Antibiotics Nausea And Vomiting  . Latex  Itching and Rash    HOME MEDICATIONS: Outpatient Prescriptions Prior to Visit  Medication Sig Dispense Refill  . ACCU-CHEK AVIVA PLUS test strip   0  . albuterol (PROVENTIL,VENTOLIN) 90 MCG/ACT inhaler Inhale 2 puffs into the lungs every 4 (four) hours as needed for wheezing.     . Azelastine HCl (ASTEPRO) 0.15 % SOLN Place 1 spray into the nose 2 (two) times daily as needed (allergic rhinitis). 30 mL 2  . budesonide-formoterol (SYMBICORT) 160-4.5 MCG/ACT inhaler Inhale 1 puff into the lungs 2 (two) times daily.  1 Inhaler   . calcium-vitamin D (CALCIUM + D) 250-125 MG-UNIT per tablet Take 2 tablets by mouth 2 (two) times daily. 1 pm & 5 pm.    . carboxymethylcellulose (REFRESH TEARS) 0.5 % SOLN Place 1 drop into both eyes 4 (four) times daily as needed (for dry eyes).    . Cetirizine HCl 10 MG CAPS Take by mouth.    . cycloSPORINE modified (NEORAL) 25 MG capsule   1  . cycloSPORINE modified 100 MG CAPS Take 150 mg by mouth 2 (two) times daily.     . divalproex (DEPAKOTE) 250 MG DR tablet Take 4 tablets (1,000 mg total) by mouth at bedtime. 150 tablet 11  . magnesium oxide (MAG-OX) 400 MG tablet Take 400 mg by mouth 2 (two) times daily. 1 pm & 5 pm.    . Magnesium Oxide 400 (240 MG) MG TABS   0  . Melatonin 3 MG CAPS Take 3 mg by mouth at bedtime.    . montelukast (SINGULAIR) 10 MG tablet Take 10 mg by mouth daily. 9 am. 30 tablet 12  . Multiple Vitamin (MULTIVITAMIN) tablet Take 1 tablet by mouth daily. 9 am.    . mycophenolate (CELLCEPT) 250 MG capsule Take 250 mg by mouth 2 (two) times daily. 9 am & 9 pm.    . mycophenolate (CELLCEPT) 250 MG capsule Take by mouth.    Marland Kitchen omeprazole (PRILOSEC) 40 MG capsule Take 40 mg by mouth daily. 9 am.    . polyethylene glycol (MIRALAX / GLYCOLAX) packet Take 17 g by mouth daily as needed (for constipation). In water or juice    . prazosin (MINIPRESS) 1 MG capsule Take 1 mg by mouth at bedtime.    Marland Kitchen PROAIR HFA 108 (90 BASE) MCG/ACT inhaler   0  . RA  CALCIUM CIT PLUS VIT D-3 315-250 MG-UNIT TABS   1  . risperiDONE (RISPERDAL) 3 MG tablet Take 3 mg by mouth at bedtime.    . senna-docusate (SENOKOT-S) 8.6-50 MG per tablet Take 2 tablets by mouth 2 (two) times daily.      No facility-administered medications prior to visit.    PAST MEDICAL HISTORY: Past Medical History  Diagnosis Date  . Allergy   . Anemia   . Intellectual disability   . Constipation   . Abnormal vaginal bleeding   . Biliary cirrhosis 04/26/11  . 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 DUB (dysfunctional uterine bleeding)   . PONV (postoperative nausea and vomiting)   . Seizures     congenital epilepsy-hx. seizures  . Asthma 09-23-12    Asthma somewhat causing increase wheezing  and mild congestion at present  . GERD (gastroesophageal reflux disease)   . Arthritis     right knee  . Vitamin D deficiency     PAST SURGICAL HISTORY: Past Surgical History  Procedure Laterality Date  . Appendectomy    . Cholecystectomy    . Eye muscle surgery    . Eustacean tubes    . Colonoscopy w/ biopsies    . Adenonoidectomy    . Esophagogastroduodenoscopy (egd) with propofol N/A 10/01/2012    Procedure: ESOPHAGOGASTRODUODENOSCOPY (EGD) WITH PROPOFOL;  Surgeon: Arta Silence, MD;  Location: [facility];  Service: Endoscopy;  Laterality: N/A;  . Liver transplant      FAMILY HISTORY: Family History  Problem Relation Age of Onset  . Diabetes Mother   . Heart disease Mother   . COPD Mother        PHYSICAL EXAM  Filed Vitals:   12/07/14 1018  BP: 119/83  Pulse: 97  Height: 5' (1.524 m)  Weight: 190 lb (86.183 kg)   Body mass index is 37.11 kg/(m^2).  Generalized: Well developed, in no acute distress   Neurological examination  Mentation: Alert oriented to time, place, history taking. Follows all commands speech and language fluent Cranial nerve II-XII: Pupils were equal round reactive to light. Extraocular movements were full, visual field were full on  confrontational test. Facial sensation and strength were normal. Uvula tongue midline. Head turning and shoulder shrug  were normal and symmetric. Motor: The motor testing reveals 5 over 5 strength of all 4 extremities. Good symmetric motor tone is noted throughout.  Sensory: Sensory testing is intact to soft touch on all 4 extremities. No evidence of extinction is noted.  Coordination: Cerebellar testing reveals good finger-nose-finger and heel-to-shin bilaterally.  Gait and station: Gait is normal. Tandem gait is slightly unsteady. Romberg is negative. No drift is seen.  Reflexes: Deep tendon reflexes are symmetric and normal bilaterally.   DIAGNOSTIC DATA (LABS, IMAGING, TESTING) - I reviewed patient records, labs, notes, testing and imaging myself where available.  Lab Results  Component Value Date   WBC 2.4* 06/08/2014   HGB 9.8* 06/08/2014   HCT 29.4* 06/08/2014   MCV 89 06/08/2014   PLT 108* 06/08/2014      Component Value Date/Time   NA 138 06/28/2014 0932   NA 141 06/08/2014 1133   K 4.2 06/28/2014 0932   CL 106 06/28/2014 0932   CO2 23 06/28/2014 0932   GLUCOSE 104* 06/28/2014 0932   GLUCOSE 101* 06/08/2014 1133   BUN 24* 06/28/2014 0932   BUN 24 06/08/2014 1133   CREATININE 0.83 06/28/2014 0932   CREATININE 0.81 06/08/2014 1133   CALCIUM 8.2* 06/28/2014 0932   CALCIUM 9.0 09/11/2007 2301   PROT 6.7 06/28/2014 0932   PROT 6.8 06/08/2014 1133   ALBUMIN 3.7 06/28/2014 0932   AST 25 06/28/2014 0932   ALT 23 06/28/2014 0932   ALKPHOS 65 06/28/2014 0932   BILITOT 0.5 06/28/2014 0932   GFRNONAA 91 06/08/2014 1133   GFRNONAA >89 11/09/2011 1208   GFRAA 105 06/08/2014 1133   GFRAA >89 11/09/2011 1208   ASSESSMENT AND PLAN 39 y.o. year old female  has a past medical history of Allergy; Anemia; Intellectual disability; Constipation; Abnormal vaginal bleeding; Biliary cirrhosis (04/26/11); Primary biliary cirrhosis (05/07/2011); DUB (dysfunctional uterine bleeding); PONV  (postoperative nausea and vomiting); Seizures; Asthma (09-23-12); GERD (gastroesophageal reflux disease); Arthritis; and Vitamin D deficiency. here with:  1. Seizures  Overall the patient is doing well. She will  continue taking Depakote 250 mg -4 tablets at bedtime. I will check blood work today. If she has any seizure events she should let us know. She will follow-up in 6 months or sooner if needed.    Ward Givens, MSN, NP-C 12/07/2014, 10:34 AM [facility] [address], [place], [place] [address] [phone]  Note: This document was prepared with digital dictation and possible smart phrase technology. Any transcriptional errors that result from this process are unintentional.

## 2014-12-07 NOTE — Progress Notes (Signed)
I have reviewed and agreed above plan. 

## 2014-12-07 NOTE — Telephone Encounter (Signed)
Patient called and requested a refill on Rx. divalproex (DEPAKOTE) 250 MG DR tablet be sent to [facility] on [address]. Please call and advise.

## 2014-12-09 ENCOUNTER — Telehealth: Payer: Self-pay

## 2014-12-09 ENCOUNTER — Ambulatory Visit: Payer: Self-pay | Admitting: Family Medicine

## 2014-12-09 LAB — CBC WITH DIFFERENTIAL/PLATELET
Basophils Absolute: 0 10*3/uL (ref 0.0–0.2)
Basos: 0 %
EOS (ABSOLUTE): 0.2 10*3/uL (ref 0.0–0.4)
EOS: 8 %
HEMOGLOBIN: 9.6 g/dL — AB (ref 11.1–15.9)
Hematocrit: 28.4 % — ABNORMAL LOW (ref 34.0–46.6)
IMMATURE GRANS (ABS): 0 10*3/uL (ref 0.0–0.1)
Immature Granulocytes: 0 %
LYMPHS: 41 %
Lymphocytes Absolute: 1.1 10*3/uL (ref 0.7–3.1)
MCH: 28.7 pg (ref 26.6–33.0)
MCHC: 33.8 g/dL (ref 31.5–35.7)
MCV: 85 fL (ref 79–97)
Monocytes Absolute: 0.4 10*3/uL (ref 0.1–0.9)
Monocytes: 16 %
NEUTROS PCT: 35 %
Neutrophils Absolute: 1 10*3/uL — ABNORMAL LOW (ref 1.4–7.0)
Platelets: 130 10*3/uL — ABNORMAL LOW (ref 150–379)
RBC: 3.35 x10E6/uL — ABNORMAL LOW (ref 3.77–5.28)
RDW: 14.9 % (ref 12.3–15.4)
WBC: 2.7 10*3/uL — ABNORMAL LOW (ref 3.4–10.8)

## 2014-12-09 LAB — COMPREHENSIVE METABOLIC PANEL
ALT: 8 IU/L (ref 0–32)
AST: 13 IU/L (ref 0–40)
Albumin/Globulin Ratio: 1.2 (ref 1.1–2.5)
Albumin: 3.7 g/dL (ref 3.5–5.5)
Alkaline Phosphatase: 58 IU/L (ref 39–117)
BUN/Creatinine Ratio: 22 (ref 9–23)
BUN: 19 mg/dL (ref 6–24)
Bilirubin Total: 0.3 mg/dL (ref 0.0–1.2)
CO2: 22 mmol/L (ref 18–29)
Calcium: 8.5 mg/dL — ABNORMAL LOW (ref 8.7–10.2)
Chloride: 107 mmol/L (ref 97–108)
Creatinine, Ser: 0.86 mg/dL (ref 0.57–1.00)
GFR calc non Af Amer: 85 mL/min/{1.73_m2} (ref >59)
GFR, EST AFRICAN AMERICAN: 98 mL/min/{1.73_m2} (ref >59)
Globulin, Total: 3 g/dL (ref 1.5–4.5)
Glucose: 100 mg/dL — ABNORMAL HIGH (ref 65–99)
Potassium: 4.6 mmol/L (ref 3.5–5.2)
Sodium: 141 mmol/L (ref 134–144)
Total Protein: 6.7 g/dL (ref 6.0–8.5)

## 2014-12-09 LAB — AMMONIA: AMMONIA: 78 ug/dL (ref 19–87)

## 2014-12-09 LAB — VALPROIC ACID LEVEL: Valproic Acid Lvl: 54 ug/mL (ref 50–100)

## 2014-12-09 NOTE — Telephone Encounter (Signed)
-----   Message from Ward Givens, NP sent at 12/09/2014  2:30 PM EDT ----- Lab work is ok- consistent with previous lab work. Ammonia level has decreased- good. Please call patient with results.

## 2014-12-09 NOTE — Telephone Encounter (Signed)
Called and spoke to patient relayed labs looked good ammonia level decreased that's good. Patient understood what I was saying.

## 2014-12-14 ENCOUNTER — Encounter: Payer: Self-pay | Admitting: Family Medicine

## 2014-12-14 ENCOUNTER — Ambulatory Visit (INDEPENDENT_AMBULATORY_CARE_PROVIDER_SITE_OTHER): Payer: Medicaid Other | Admitting: Family Medicine

## 2014-12-14 VITALS — BP 122/73 | HR 93 | Temp 98.1°F | Ht 60.0 in | Wt 188.1 lb

## 2014-12-14 DIAGNOSIS — R109 Unspecified abdominal pain: Secondary | ICD-10-CM | POA: Insufficient documentation

## 2014-12-14 DIAGNOSIS — E669 Obesity, unspecified: Secondary | ICD-10-CM | POA: Diagnosis not present

## 2014-12-14 DIAGNOSIS — R1030 Lower abdominal pain, unspecified: Secondary | ICD-10-CM | POA: Diagnosis not present

## 2014-12-14 MED ORDER — ACCU-CHEK SOFTCLIX LANCETS MISC: Status: DC

## 2014-12-14 NOTE — Assessment & Plan Note (Signed)
Patient slowly working towards goals. While she is attempting to increase the amount of vegetables in her diet I am concerned that she is not avoiding/limiting unhealthy choices.  - Patient to follow up with nutrition on 8/4. - congratulated pt on changing to 1% milk - continued to stress importance of including vegetables in diet and making healthier food decisions (fruit in place of cookies).

## 2014-12-14 NOTE — Assessment & Plan Note (Signed)
Most likely secondary to constipation. No dysuria concerning for UTI. No vaginal discharge concerning for gynecologic etiology. No hematuria or CVA tenderness concerning for kidney stones. No rebound or guarding concerning for surgical abdomen. Continues to have loose stools and +BS on exam so less likely obstruction (although pt does have a significant abdominal surgeries in the past).  - urge patient to get x-ray that was scheduled for tomorrow.  - RTC precautions discussed: worsening pain, hematochezia, melena, dysuria, fevers. Patient voiced understanding.

## 2014-12-14 NOTE — Patient Instructions (Signed)
Remember to try to eat a vegetable for lunch or dinner. The vegetable should take up half the plate. Continue 1% milk.  I think your abdominal pain is secondary to constipation. Definitely go get the x-ray.  If you blood sugar is greater than 150 when you wake up before you eat anything please call our office.  Bring in your blood sugar log at your next appointment.

## 2014-12-14 NOTE — Progress Notes (Signed)
Patient ID: [name], female   DOB: 10-16-1975, 40 y.o.   MRN: [id number]    Subjective: CC: concerns for elevated CBGs.  HPI: Patient is a 39 y.o. female with a past medical history of primary biliary cirrhosis s/p liver transplant in 06/2013, recurrent seizure like activity, mental retardation and paranoid type schizophrenia presenting to clinic today for a follow up on her weight and concerns of elevated blood sugars.  Weight: Her weight has increased from 184lbs on 6/2 and is currently 188. Feels she has generalized swelling of her abdomen and legs b/l. Was supposed to go to nutrition last week but couldn't get transportation. Has follow up in 2 weeks. Eating vegetable at dinner. States they don't give her a vegetable for lunch at daycare. Goes to daycare [employer], Friday. At church tries to pack carrots but sometimes eats cookies as well.   Suprapubic pain x 3wks ago:  Dull pain that doesn't radiate. Pain is constant but gets worse when she eats. Patient has loose stools and sometimes feels like she "cant get it all out." No hematochezia/melana. Stools are brownish/orange.  No nausea or vomiting. She called her GI doctor yesterday (Dr. Paulita Fujita with [facility]) who ordered an x-ray which she will get tomorrow (they were concerned about constipation. No hematuria, dysuria, back pain. No vaginal discharge.   CBGs: patient was given a glucometer by her transplant MD to monitor for glucose intolerance. Concerned because on AM while fashing her CBG was 109. Notes it was once as high as 180 after eating. Endorses drinking more water and urinating more as it is hot outside. No paresthesias of the LE b/l.   Social History: never smoker    ROS: All other systems reviewed and are negative.  Past Medical History Patient Active Problem List   Diagnosis Date Noted  . Abdominal pain 12/14/2014  . Abnormality of gait 10/15/2014  . Amenorrhea 06/28/2014  . Right shoulder pain 06/28/2014  . Encounter  for cervical Pap smear with pelvic exam 05/12/2014  . Lumbar back pain 04/06/2014  . Health care maintenance 03/06/2014  . Acute bronchitis, bacterial 02/12/2014  . H/O liver transplant 11/23/2013  . Thrombocytopenia 03/09/2013  . Seizure disorder 03/01/2013  . Schizophrenia, paranoid type 06/25/2012    Class: Chronic  . 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 Vision loss, bilateral 03/30/2012  . 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 Menorrhagia 12/22/2010  . Unspecified vitamin D deficiency 09/13/2010  . HEMATURIA UNSPECIFIED 06/14/2010  . Rectal bleeding 06/14/2010  . ANEMIA 03/03/2010  . GERD 07/21/2008  . 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 CARDIAC FLOW MURMUR 10/16/2007  . EPILEPSY 08/23/2006  . ALLERGIC RHINITIS 07/30/2006  . Obesity 07/25/2006  . 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 Mental retardation 07/25/2006  . ASTHMA, PERSISTENT 07/25/2006  . OSTEOARTHRITIS, LOWER LEG 07/25/2006    Medications- reviewed and updated Current Outpatient Prescriptions  Medication Sig Dispense Refill  . ACCU-CHEK AVIVA PLUS test strip   0  . ACCU-CHEK SOFTCLIX LANCETS lancets Use to check blood sugar once daily in the morning prior to eating. 100 each 2  . albuterol (PROVENTIL,VENTOLIN) 90 MCG/ACT inhaler Inhale 2 puffs into the lungs every 4 (four) hours as needed for wheezing.     . Azelastine HCl (ASTEPRO) 0.15 % SOLN Place 1 spray into the nose 2 (two) times daily as needed (allergic rhinitis). 30 mL 2  . budesonide-formoterol (SYMBICORT) 160-4.5 MCG/ACT inhaler Inhale 1 puff into the lungs 2 (two) times daily.  1 Inhaler   .  calcium-vitamin D (CALCIUM + D) 250-125 MG-UNIT per tablet Take 2 tablets by mouth 2 (two) times daily. 1 pm & 5 pm.    . carboxymethylcellulose (REFRESH TEARS) 0.5 % SOLN Place 1 drop into both eyes 4 (four) times daily as needed (for dry eyes).    . Cetirizine HCl 10 MG CAPS Take by mouth.    . cycloSPORINE modified (NEORAL) 25 MG capsule   1  . cycloSPORINE  modified 100 MG CAPS Take 150 mg by mouth 2 (two) times daily.     . divalproex (DEPAKOTE) 250 MG DR tablet Take 4 tablets (1,000 mg total) by mouth at bedtime. 120 tablet 6  . magnesium oxide (MAG-OX) 400 MG tablet Take 400 mg by mouth 2 (two) times daily. 1 pm & 5 pm.    . Magnesium Oxide 400 (240 MG) MG TABS   0  . Melatonin 3 MG CAPS Take 3 mg by mouth at bedtime.    . montelukast (SINGULAIR) 10 MG tablet Take 10 mg by mouth daily. 9 am. 30 tablet 12  . Multiple Vitamin (MULTIVITAMIN) tablet Take 1 tablet by mouth daily. 9 am.    . mycophenolate (CELLCEPT) 250 MG capsule Take 250 mg by mouth 2 (two) times daily. 9 am & 9 pm.    . mycophenolate (CELLCEPT) 250 MG capsule Take by mouth.    Marland Kitchen omeprazole (PRILOSEC) 40 MG capsule Take 40 mg by mouth daily. 9 am.    . polyethylene glycol (MIRALAX / GLYCOLAX) packet Take 17 g by mouth daily as needed (for constipation). In water or juice    . prazosin (MINIPRESS) 1 MG capsule Take 1 mg by mouth at bedtime.    Marland Kitchen PROAIR HFA 108 (90 BASE) MCG/ACT inhaler   0  . RA CALCIUM CIT PLUS VIT D-3 315-250 MG-UNIT TABS   1  . risperiDONE (RISPERDAL) 3 MG tablet Take 3 mg by mouth at bedtime.    . senna-docusate (SENOKOT-S) 8.6-50 MG per tablet Take 2 tablets by mouth 2 (two) times daily.      No current facility-administered medications for this visit.    Objective: Office vital signs reviewed. BP 122/73 mmHg  Pulse 93  Temp(Src) 98.1 F (36.7 C) (Oral)  Ht 5' (1.524 m)  Wt 188 lb 2 oz (85.333 kg)  BMI 36.74 kg/m2  LMP 11/20/2014 (Approximate)   Physical Examination:  General: Awake, alert, obese, NAD. Pleasant. Eyes: Conjunctiva non-injected.  Cardio: RRR, III/VI systolic murmur noted.  Pulm: No increased WOB. CTAB, without wheezes, rhonchi or crackles noted.  Abd: +BS in all 4 quadrants. Soft. ND. Minimally TTP in the suprapubic region. No rebound or guarding. No CVA tenderness.  MSK: Normal gait and station.  Skin: dry, intact, no rashes  or lesions Neuro: A&Ox3.   Assessment/Plan: Abdominal pain Most likely secondary to constipation. No dysuria concerning for UTI. No vaginal discharge concerning for gynecologic etiology. No hematuria or CVA tenderness concerning for kidney stones. No rebound or guarding concerning for surgical abdomen. Continues to have loose stools and +BS on exam so less likely obstruction (although pt does have a significant abdominal surgeries in the past).  - urge patient to get x-ray that was scheduled for tomorrow.  - RTC precautions discussed: worsening pain, hematochezia, melena, dysuria, fevers. Patient voiced understanding.   Obesity Patient slowly working towards goals. While she is attempting to increase the amount of vegetables in her diet I am concerned that she is not avoiding/limiting unhealthy choices.  - Patient to  follow up with nutrition on 8/4. - congratulated pt on changing to 1% milk - continued to stress importance of including vegetables in diet and making healthier food decisions (fruit in place of cookies).     No orders of the defined types were placed in this encounter.    Meds ordered this encounter  Medications  . DISCONTD: ACCU-CHEK SOFTCLIX LANCETS lancets    Sig: Use as instructed    Dispense:  100 each    Refill:  2  . ACCU-CHEK SOFTCLIX LANCETS lancets    Sig: Use to check blood sugar once daily in the morning prior to eating.    Dispense:  100 each    Refill:  [place] PGY-2, [place]

## 2014-12-15 ENCOUNTER — Other Ambulatory Visit: Payer: Self-pay | Admitting: Gastroenterology

## 2014-12-15 ENCOUNTER — Ambulatory Visit
Admission: RE | Admit: 2014-12-15 | Discharge: 2014-12-15 | Disposition: A | Discharge status: Home / Self Care | Payer: Medicaid Other | Source: Ambulatory Visit | Attending: Gastroenterology | Admitting: Gastroenterology

## 2014-12-15 DIAGNOSIS — R197 Diarrhea, unspecified: Secondary | ICD-10-CM

## 2014-12-15 DIAGNOSIS — R109 Unspecified abdominal pain: Secondary | ICD-10-CM

## 2014-12-30 ENCOUNTER — Ambulatory Visit (INDEPENDENT_AMBULATORY_CARE_PROVIDER_SITE_OTHER): Payer: Medicaid Other | Admitting: Obstetrics and Gynecology

## 2014-12-30 ENCOUNTER — Encounter: Payer: Self-pay | Admitting: Obstetrics and Gynecology

## 2014-12-30 VITALS — Ht 60.0 in | Wt 192.8 lb

## 2014-12-30 DIAGNOSIS — E669 Obesity, unspecified: Secondary | ICD-10-CM | POA: Diagnosis present

## 2014-12-30 DIAGNOSIS — K5900 Constipation, unspecified: Secondary | ICD-10-CM | POA: Insufficient documentation

## 2014-12-30 NOTE — Progress Notes (Signed)
Primary concerns today: Weight management.  -Says liver transplant doctor wants her to lose weight (~150lbs) -Clothes will fit better and she will feel better  Physical activity includes: Walks every day down the street 2 times to Aunt's house (afternoon and after dinner). Unsure of how long this takes. -Does chair exercises at day center -Walks around house before bed.   Diet: -Canned green beans rinse salt off -Frozen broccoli/califlower -Chocolate rice krispies for breakfast some days -Has snack bag with her that included crackers, chewy bars, popcorn, little bites muffins, peanuts  Progress/changes since last visit:  -Frozen mixed vegetables  - cauliflower, broccoli, carrots -Skim milk now   24-hr recall: (Up at  AM) 4am B ( AM)-   Watermelon(bowl full - 2 cups), package of peanuts Snk ( AM)-    peanuts (300cal) L (12:30 PM)-  Salmon burger in hamburger bun with mustard, lipton green tea (175cal) Snk ( PM)-  ? D (4:50 PM)-  Veggie burger in hamburger bun, ketchup, mustard, onions, mixed vegetables(3 cups), watermelon, lipton green tea(175cal), blubbery muffins (little bites 190cal)  Snk (PM)-  None Typical day? Yes.     Estimated calorie intake of 2100  -Goes to [employer] to get lunch Mon, Wed, Friday -Drinks about 6 bottles of water a day -Pear tree at home  Learning Readiness:   Ready  Barriers to learning/adherence to lifestyle change: Cognitive function   Handouts given during visit include:  AVS    Demonstrated degree of understanding via:  Teach Back   Monitoring/Evaluation:  Dietary intake, exercise, and body weight prn.

## 2014-12-30 NOTE — Patient Instructions (Addendum)
   Exercise at least 30 min daily but is better ~1hr. Recommend timing how long it takes you to walk down to Aunt's house and back.  Limit sweet drinks(like your green tea) to one per day (lunch or dinner)  Add more diversity to your fruits and vegetables. Have vegetable with lunch and dinner. These will give you more fiber.  Replace snacks with fruit and/or vegetables (berries have good fiber)  Make sure bread is WHOLE wheat   Raisin bran or All bran cereal will give you more fiber  Look for cereal with at least 5 grams of fiber per serving  Follow-up Sept 22 at [facility]

## 2015-01-11 ENCOUNTER — Ambulatory Visit
Admission: RE | Admit: 2015-01-11 | Discharge: 2015-01-11 | Disposition: A | Discharge status: Home / Self Care | Payer: Medicaid Other | Source: Ambulatory Visit | Attending: Gastroenterology | Admitting: Gastroenterology

## 2015-01-11 ENCOUNTER — Other Ambulatory Visit: Payer: Self-pay | Admitting: Gastroenterology

## 2015-01-11 ENCOUNTER — Ambulatory Visit: Payer: Self-pay | Admitting: Family Medicine

## 2015-01-11 DIAGNOSIS — K5641 Fecal impaction: Secondary | ICD-10-CM

## 2015-01-20 ENCOUNTER — Ambulatory Visit: Payer: Self-pay | Admitting: Family Medicine

## 2015-01-24 ENCOUNTER — Ambulatory Visit (INDEPENDENT_AMBULATORY_CARE_PROVIDER_SITE_OTHER): Payer: Medicaid Other | Admitting: Family Medicine

## 2015-01-24 ENCOUNTER — Encounter: Payer: Self-pay | Admitting: Family Medicine

## 2015-01-24 VITALS — BP 119/75 | HR 96 | Temp 98.1°F | Ht 60.0 in | Wt 193.3 lb

## 2015-01-24 DIAGNOSIS — K5900 Constipation, unspecified: Secondary | ICD-10-CM | POA: Diagnosis not present

## 2015-01-24 DIAGNOSIS — R350 Frequency of micturition: Secondary | ICD-10-CM | POA: Diagnosis not present

## 2015-01-24 DIAGNOSIS — R7301 Impaired fasting glucose: Secondary | ICD-10-CM | POA: Diagnosis present

## 2015-01-24 LAB — POCT URINALYSIS DIPSTICK
BILIRUBIN UA: NEGATIVE
Blood, UA: NEGATIVE
GLUCOSE UA: NEGATIVE
Ketones, UA: NEGATIVE
LEUKOCYTES UA: NEGATIVE
NITRITE UA: NEGATIVE
Protein, UA: NEGATIVE
Spec Grav, UA: 1.015
UROBILINOGEN UA: 0.2
pH, UA: 5.5

## 2015-01-24 LAB — POCT GLYCOSYLATED HEMOGLOBIN (HGB A1C): Hemoglobin A1C: 5.1

## 2015-01-24 NOTE — Assessment & Plan Note (Signed)
Patient presenting with numerous blood sugars around 132 and 134 while fasting in the AM. She notes some polyuria but not polydipsia. - Hemoglobin A1c today

## 2015-01-24 NOTE — Patient Instructions (Signed)
I have ordered a urine sample to look for a bladder infection. I think the cause of your stomach pain is irritable bowel syndrome, however follow up with Dr. Paulita Fujita Please continue to take the Miralax 17grams daily. Irritable Bowel Syndrome Irritable bowel syndrome (IBS) is caused by a disturbance of normal bowel function and is a common digestive disorder. You may also hear this condition called spastic colon, mucous colitis, and irritable colon. There is no cure for IBS. However, symptoms often gradually improve or disappear with a good diet, stress management, and medicine. This condition usually appears in late adolescence or early adulthood. Women develop it twice as often as men. CAUSES  After food has been digested and absorbed in the small intestine, waste material is moved into the large intestine, or colon. In the colon, water and salts are absorbed from the undigested products coming from the small intestine. The remaining residue, or fecal material, is held for elimination. Under normal circumstances, gentle, rhythmic contractions of the bowel walls push the fecal material along the colon toward the rectum. In IBS, however, these contractions are irregular and poorly coordinated. The fecal material is either retained too long, resulting in constipation, or expelled too soon, producing diarrhea. SIGNS AND SYMPTOMS  The most common symptom of IBS is abdominal pain. It is often in the lower left side of the abdomen, but it may occur anywhere in the abdomen. The pain comes from spasms of the bowel muscles happening too much and from the buildup of gas and fecal material in the colon. This pain:  Can range from sharp abdominal cramps to a dull, continuous ache.  Often worsens soon after eating.  Is often relieved by having a bowel movement or passing gas. Abdominal pain is usually accompanied by constipation, but it may also produce diarrhea. The diarrhea often occurs right after a meal or upon  waking up in the morning. The stools are often soft, watery, and flecked with mucus. Other symptoms of IBS include:  Bloating.  Loss of appetite.  Heartburn.  Backache.  Dull pain in the arms or shoulders.  Nausea.  Burping.  Vomiting.  Gas. IBS may also cause symptoms that are unrelated to the digestive system, such as:  Fatigue.  Headaches.  Anxiety.  Shortness of breath.  Trouble concentrating.  Dizziness. These symptoms tend to come and go. DIAGNOSIS  The symptoms of IBS may seem like symptoms of other, more serious digestive disorders. Your health care provider may want to perform tests to exclude these disorders.  TREATMENT Many medicines are available to help correct bowel function or relieve bowel spasms and abdominal pain. Among the medicines available are:  Laxatives for severe constipation and to help restore normal bowel habits.  Specific antidiarrheal medicines to treat severe or lasting diarrhea.  Antispasmodic agents to relieve intestinal cramps. Your health care provider may also decide to treat you with a mild tranquilizer or sedative during unusually stressful periods in your life. Your health care provider may also prescribe antidepressant medicine. The use of this medicine has been shown to reduce pain and other symptoms of IBS. Remember that if any medicine is prescribed for you, you should take it exactly as directed. Make sure your health care provider knows how well it worked for you. HOME CARE INSTRUCTIONS   Take all medicines as directed by your health care provider.  Avoid foods that are high in fat or oils, such as heavy cream, butter, frankfurters, sausage, and other fatty meats.  Avoid  foods that make you go to the bathroom, such as fruit, fruit juice, and dairy products.  Cut out carbonated drinks, chewing gum, and "gassy" foods such as beans and cabbage. This may help relieve bloating and burping.  Eat foods with bran, and drink  plenty of liquids with the bran foods. This helps relieve constipation.  Keep track of what foods seem to bring on your symptoms.  Avoid emotionally charged situations or circumstances that produce anxiety.  Start or continue exercising.  Get plenty of rest and sleep. Document Released: 05/14/2005 Document Revised: 05/19/2013 Document Reviewed: 01/02/2008 [facility] Patient Information 2015 [place], [place]. This information is not intended to replace advice given to you by your health care provider. Make sure you discuss any questions you have with your health care provider.

## 2015-01-24 NOTE — Assessment & Plan Note (Signed)
Patient with continued abdominal pain despite regular BMs now. As the pain is worsened by eating and improved by defecation without worrisome signs: hematochezia, melana, weight loss, fevers, rebound/guarding on exam, I suspect this is secondary to IBS. Patient states she has a h/o IBS. Patient states she was advised by Dr. Paulita Fujita to make a f/u appt as she was still having abdominal pain- she is following up on Friday.  - Continue Miralax 17g daily. - RTC precautions discussed and patient voiced understanding.

## 2015-01-24 NOTE — Progress Notes (Signed)
Subjective:     Patient ID: [name], female   DOB: November 18, 1975, 39 y.o.   MRN: [id number]  HPI Pt seen for f/u of constipation and peri-umbilical abdominal pain for past 1 month. Patient seen by GI Dr. Paulita Fujita and started on a bowel regimen, but after having very loose stools, is now on Miralax 17 mg daily. Patient reports having loose stools with pudding consistency, but states her abdominal pain is no better. Patient describes pain as dull 9/10 pain that reduces to 7/10 pain after bowel movements, but then returns to 9/10 after eating. She reports 1 bowel movement daily, with no change in fluid intake but some increase in vegetables. She also c/o increased urine frequency and nocturnal enuresis (patient states she woke up with her underwear wet, the bed was not wet, clear substance, thin, not malodorous). for couple weeks. Patient denies chest pain, dysuria, muscle pain, leg pain, bloody stools, nausea, vomiting, weight loss or fatigue.  Patient states she has had increased fasting blood sugars over past couple weeks, 3 episodes greater than 130.    Patient also reports difficulty sleeping past 3 weeks. Her Psychiatrist started her on Hydroxyzine 25mg  as night, which is helping.   Review of Systems  Constitutional: Negative for activity change, appetite change, fatigue and unexpected weight change.  HENT: Negative for congestion.   Cardiovascular: Negative for chest pain and leg swelling.  Gastrointestinal: Positive for abdominal pain and constipation. Negative for nausea, vomiting, blood in stool and anal bleeding.  Genitourinary: Positive for frequency. Negative for dysuria and flank pain.       Objective:   Physical Exam  Constitutional: She is oriented to person, place, and time. She appears well-developed and well-nourished. No distress.  HENT:  Head: Normocephalic and atraumatic.  Cardiovascular: Normal rate, regular rhythm and intact distal pulses.  Exam reveals no friction rub.    Murmur heard.  Systolic murmur is present with a grade of 3/6  Pulmonary/Chest: Effort normal and breath sounds normal. No respiratory distress. She has no wheezes.  Abdominal: Soft. Bowel sounds are normal. She exhibits no distension and no mass. There is no tenderness. There is no rebound and no guarding.  Musculoskeletal: She exhibits no edema or tenderness.  Neurological: She is alert and oriented to person, place, and time.       Assessment:   1. Persistent Abdominal pain likely 2/2 IBS vs. refractory constipation  2. Elevated fasting blood sugars 2/2 pre-diabetes vs. Glucose intolerance  3. Nocturnal Enuresis 2/2 UTI vs. Pre-diabetes    Plan:   1. Persistent abdominal pain most likely 2/2 IBS - Continue Miralax 17mg  daily for constipation - GI for IBS management vs further workup given patient's extensive medical history.    2. Elevated fasting blood sugars Impaired fasting glucose per reports at home. - Check HgB A1C  3. Nocturnal Enuresis: From patient's history, I almost sounds like clear vaginal discharge but she is unable to tell me, she just states when she woke up, she noted wet underwear.  - Clean-catch urinaylsis to r/o UTI  Marlynn Perking, MS4  Upper Level Addendum:  I have seen and evaluated this patient along with Marlynn Perking and reviewed the above note, making necessary revisions in purple. Please see my physical exam below and my A&P in the problem list.  Blood pressure 119/75, pulse 96, temperature 98.1 F (36.7 C), temperature source Oral, height 5' (1.524 m), weight 193 lb 5 oz (87.686 kg), last menstrual period 01/15/2015. Gen: sitting up  in NAD CV: RRR. 3/6 systolic murmur. No pitting edema.  Lungs: CTAB without wheezing, rhonchi, or crackles.  Abdomen: +BS. Soft, non-distended, non-tender to palpation.   [name], MD [place] Resident, PGY-2

## 2015-02-17 ENCOUNTER — Ambulatory Visit (INDEPENDENT_AMBULATORY_CARE_PROVIDER_SITE_OTHER): Payer: Medicaid Other | Admitting: Family Medicine

## 2015-02-17 VITALS — Ht 60.0 in | Wt 195.0 lb

## 2015-02-17 DIAGNOSIS — E669 Obesity, unspecified: Secondary | ICD-10-CM | POA: Diagnosis present

## 2015-02-17 NOTE — Patient Instructions (Addendum)
-   Add vegetables to the meal at lunch and dinner to take up about 1/2 of the plate  - try eating more frozen vegetables since they are easy to make  - inform your in home aide that you are trying to lose weight.  - please limit the amount of cheese, butter, or other high fat condiments made when preparing your meals.    Please make an appointment when you leave today to see Dr. Valentina Lucks for polypharmacy.  Make sure that you bring all of your medications when you see Dr. Valentina Lucks.  We think that some of your medications may getting in the way of losing some weight.   Please follow up with your appointment on October 27 at 10:30 .  Please schedule this appointment for 1 hour.

## 2015-02-17 NOTE — Progress Notes (Signed)
Progress towards goals:   Monday, Thursday and Friday she goes to group center (daycare) and lunch is provided. She has an in home aide that helps her prepare meals. They serve desserts with lunch while at daycare. She drinks milk (1%) at daycare. She got just a new in home aide.   Her liver transplant doctor wants her to limit the amount of water she intakes to 4 bottles per day. Her doctor wants her to achieve a goal weight of ~130 lbs.  Her inability to exercise on a regular basis limits her ability to achieve this goal. She is able to walk more at home than at the daycare. She walks around her house and down the street. She walks about 15 minutes to walk to her Aunt's house. She will walk continuously without stopping. She does chair exercises at daycare and this involves different maneuvers that last around 45 minutes. She does an exercise routine of "sweat to the oldies" on the weekend by Charlean Sanfilippo.   She is only eats vegetables when they are made. She is eating some frozen vegetables. She is able to microwave these frozen vegetables.   Patient stated that she was recently prescribed hydroxyzine for sleep, which she has been taking consistently at bedtime.   24-hr recall:  (Up at 4 AM) B (6 AM)-  Boiled egg, plastic cup peaches (3/4 cup), water  Snk (9 AM)- skim milk 1.5 cups to take with medicines   L (12 PM)- chicken alfredo (by weight watchers), green diet tea    Snk ( PM)- none  D (5 PM)- plain carp (~4-6 oz), layered salad (2 cups)  (spinach, green peas, boiled egg, peppers, bacon bits) mayonnaise (miracle whip 2 tbspn fat free) water    Snk (8 PM)-  Grapes 2 cups, ate another 4 cups of salad  Typical day? Yes.    Recent physical activity includes as stated above .  Handouts given during visit include:  AVS  Barriers to learning/adherence to lifestyle change: patient with history of mental retardation  Demonstrated degree of understanding via:  Teach Back    Monitoring/Evaluation:  Dietary intake, exercise,  (Blood glucose levels) - Add vegetables to the meal at lunch and dinner to take up about 1/2 of the plate  - try eating more frozen vegetables since they are easy to make  - inform your in home aide that you are trying to lose weight.  - please limit the amount of cheese, butter, or other high fat condiments made when preparing your meals. - please continue to follow the recommendations made by your liver transplant doctor about limiting the amount of water that you are drinking per day (~4 bottles)  - she has follow up on October 27 at 10:30   30 minutes was spent face to face with greater than 50% of the time spent counseling.   Rosemarie Ax, MD PGY-3, [facility] 02/17/2015, 9:50 AM

## 2015-03-03 ENCOUNTER — Encounter: Payer: Self-pay | Admitting: Pharmacist

## 2015-03-03 ENCOUNTER — Ambulatory Visit (INDEPENDENT_AMBULATORY_CARE_PROVIDER_SITE_OTHER): Payer: Medicaid Other | Admitting: Pharmacist

## 2015-03-03 VITALS — BP 116/70 | HR 93 | Wt 199.0 lb

## 2015-03-03 DIAGNOSIS — R569 Unspecified convulsions: Secondary | ICD-10-CM | POA: Diagnosis present

## 2015-03-03 DIAGNOSIS — G40909 Epilepsy, unspecified, not intractable, without status epilepticus: Secondary | ICD-10-CM

## 2015-03-03 NOTE — Progress Notes (Signed)
S:    Patient arrives in good spirits ambulating on her own.  Presents for a polypharmacy initial visit. Patient presents with all of her medications and a chart that she uses detailing timing of medications at home. Patient has a nursing aide that visits her home every afternoon. Nurse helps manage patient's medications and sets aside evening and morning doses in individual cups.   Denies any recent seizure activity.  Patient reports adherence with medications.    O:  Lab Results  Component Value Date   HGBA1C 5.1 01/24/2015    Home fasting CBG: 100s  A/P: Patient reports adherence with medication. Patient appears to have a good understanding of medication regimen. She is able to verbalize doses and indications for most medications. Patient receives medication management assistance from home health.  Upon review of medication list, noted that patient takes acetaminophen as needed for headaches. With history of liver transplant in 2015 recommended patient try to limit acetaminophen intake.   Allergic rhinitis currently under good control with use singulair, and azelastine nasal spracy.  Patient taking both brand and generic azelastine nasal spray to the visit. Reeducated patient that they were the same product in different packaging and counseled her to use just one. Patient verbalized understanding.  Per review of the chart, noted that primary care has been encouraging weight loss. May consider alternative anti-epileptic, topiramate, in place of Depakote which could assist with weight loss.  Written patient instructions provided.  Total time in face to face counseling 30 minutes.   Follow up in PCP, Dr. Lorenso Courier in 1 month.   Patient seen with Dimitri Ped, PharmD Resident.and Elisabeth Most, PharmD, resident.  Marland Kitchen

## 2015-03-03 NOTE — Assessment & Plan Note (Signed)
Consider switch depakote.

## 2015-03-03 NOTE — Assessment & Plan Note (Signed)
Difficult to control seizure disorder managed currently with Valproic Acid.   Weight loss and minimization of hepatotoxicity may justify additional trials of topiramate or other alternative.

## 2015-03-03 NOTE — Progress Notes (Signed)
Patient ID: [name], female   DOB: 1976-03-10, 39 y.o.   MRN: [id number] Reviewed: Agree with Dr. Graylin Shiver documentation and management.

## 2015-03-03 NOTE — Assessment & Plan Note (Signed)
Allergic rhinitis currently under good control with use singulair, and azelastine nasal spracy.  Patient taking both brand and generic azelastine nasal spray to the visit. Reeducated patient that they were the same product in different packaging and counseled her to use just one. Patient verbalized understanding.

## 2015-03-03 NOTE — Patient Instructions (Signed)
Thank you for coming to see [place] today.  Bring your medications to your next appointment.  Please follow up with your PCP, Dr. Lorenso Courier in 1 month.

## 2015-03-10 ENCOUNTER — Ambulatory Visit: Payer: Medicaid Other | Admitting: Allergy and Immunology

## 2015-03-24 ENCOUNTER — Ambulatory Visit (INDEPENDENT_AMBULATORY_CARE_PROVIDER_SITE_OTHER): Payer: Medicaid Other | Admitting: Family Medicine

## 2015-03-24 ENCOUNTER — Encounter: Payer: Self-pay | Admitting: Family Medicine

## 2015-03-24 VITALS — Ht 60.0 in | Wt 200.2 lb

## 2015-03-24 DIAGNOSIS — E669 Obesity, unspecified: Secondary | ICD-10-CM | POA: Diagnosis not present

## 2015-03-24 NOTE — Progress Notes (Signed)
Medical Nutrition Therapy:  Appt start time: 1030 end time:  1130.  Assessment:  Primary concerns today: Weight management. (E66.9)  Learning Readiness:  Change in progress; [name] said she has talked with her aide about trying to eat foods that will help her better manage her weight and she has been eating more vegetables, although this is not apparent from her 24-her recall (below).      Barriers to learning/adherence to lifestyle change: Most foods are provided for her by others; uncle grocery shops for her; several meals and snacks are provided at daycare and at church functions each week.  Patient comprehension of recommendations not always clear.    Usual eating pattern includes 3-4 meals (often eats 2 breakfasts) and 1 snack per day. Reported usual physical activity suggests that [name] is not fully aware of time descriptors:   Physical activity:  Walks 3 X day: Walks in the house "from 6 to 9 PM," and walks to her aunt's house and back, which is on the same block, around 2 PM and again after her aide leaves at "5:41 PM."   Daily routine: Goes to bed at 9 PM to watch TV thru her favorite show, [address]" which starts at 23 PM; sometimes sees the second "[place]ant[place]" show, which comes on at 1 AM.   - Mondays, Thur, and Fri, gets up at 5 AM, dresses ~6:30 or 7, and leaves for daycare at 8:45.  Has a light breakfast at home, then another breakfast at daycare.   - Sundays, gets up ~4 AM, and leaves for church ~7 AM.  Has fruit at home, then breakfast at church at 9 AM, after 8 AM service, then Sunday school at 9:45, 2nd service at 73).  After SS, has bagels with cream cheese or donuts or granola bar with hot choc or sweet tea or coffee (with 2 tsp sugar).   - Tuesdays, Wed, and Sat, gets up at 7 or 8 AM, eats breakfast, then watches TV for a couple of hours.  At Bible study (12-1 PM Tue), she gets a snack such as cheese & crackers, cupcakes, or cookies) with juice, lemonade, coffee, or hot choc.   Sometimes skips lunch on Tuesdays.  On Wednesdays, [name] is home all day till bible study (where no snack is served); leaves at 6:30, gets home at 8:20.  Also goes to evening bible study 2nd and 4th Mondays, where they serve some kind of sweet snack (cookies or cupcakes).    24-hr recall:  (Up at 5 AM) B (6 AM)-   2 c cubed watermelon Snk (11:30)-   3 pkts instant cheese grits, 1/2 c added shredded cheese, water Went walking outside, about 3 houses down, and back.   L ( PM)-  --- Snk ( PM)-  --- D (5 PM)-  4 c Beef-a-Roni pasta, water Snk ( PM)-  2 c cubed watermelon Typical day? Yes.  Pretty normal for a day at home.    Progress Towards Goal(s):  In progress.   Nutritional Diagnosis:  [place]-3.3 Overweight/obesity As related to energy imbalance.  As evidenced by BMI >30.    Intervention:  Nutrition education.  Handouts given during visit include:  AVS  Copy of AVS to give to daycare staff  Demonstrated degree of understanding via:  Teach Back   Monitoring/Evaluation:  Dietary intake, exercise, and body weight in 5 week(s).

## 2015-03-24 NOTE — Patient Instructions (Signed)
Most important dietary changes you can make: 1. Include vegetables with BOTH lunch and dinner.  For example, instead of 3-4 cups of Beef-a-Roni, have 1-2 cups, and 2 cups of vegetables.  This will give you a lot FEWER calories and a lot MORE nutrition.   2. Limit sweet desserts (cookies, candies, donuts, cake, etc) are "SOMETIMES" foods, not "EVERYDAY" foods.    - If you need a note from your doctor to say that you have a need for fresh fruit instead of these sweets, let us know.    3. Limit added fats like butter, cheese, mayonnaise, and salad dressing.  This doesn't mean you can't eat any of these foods, but use the least amount you can use.  For example, when you have grits, only add 2 tablespoons of cheese, not 1/2 cup (which is equal to 8 tablespoons).    - Be sure to let your uncle know which vegetables you especially like, so he can keep these on hand for you.    - Likes:  Broccoli (with cheese), green beans, cauliflower, cabbage, spinach, salads, cherry tomatoes, carrots, cucumbers, bell peppers, onions, zucchini, mushrooms.   - Does NOT like:  Peas (but baby sweet peas are fine).    - PLEASE MAKE SURE PEGGY AS WELL AS YOUR DAYCARE CENTER UNDERSTAND THAT YOU NEED VEGETABLES WITH BOTH LUNCH AND DINNER!  - You have gained 35 pounds in the last year!  It's important for you to lose some weight to keep your liver (and you) as healthy as possible!  - PLEASE BE SURE TO SHOW THIS PAPER TO YOUR DAYCARE STAFF.  Tell your aide and daycare staff to call me if they have questions:  [phone].

## 2015-03-31 ENCOUNTER — Ambulatory Visit: Payer: Medicaid Other | Admitting: Allergy and Immunology

## 2015-04-05 ENCOUNTER — Encounter: Payer: Self-pay | Admitting: Family Medicine

## 2015-04-05 ENCOUNTER — Ambulatory Visit (INDEPENDENT_AMBULATORY_CARE_PROVIDER_SITE_OTHER): Payer: Medicaid Other | Admitting: Family Medicine

## 2015-04-05 VITALS — BP 129/77 | HR 90 | Temp 97.5°F | Wt 202.2 lb

## 2015-04-05 DIAGNOSIS — Z0000 Encounter for general adult medical examination without abnormal findings: Secondary | ICD-10-CM | POA: Diagnosis present

## 2015-04-05 DIAGNOSIS — E669 Obesity, unspecified: Secondary | ICD-10-CM | POA: Diagnosis not present

## 2015-04-05 NOTE — Patient Instructions (Signed)
When you come to meet with Dr. Jenne Campus in December, please come fasting (do not eat or drink anything but water prior the visit) that way we can check your cholesterol. Please pack something to eat like fresh fruit so you're not hungry during your appointment with Dr. Jenne Campus.  Follow up with me in 3 months or sooner as needed.

## 2015-04-05 NOTE — Progress Notes (Signed)
Patient ID: [name], female   DOB: 1975-11-28, 39 y.o.   MRN: [id number]    Subjective: CC: f/u weight HPI: Patient is a 39 y.o. female with a past medical history of primary biliary cirrhosis s/p liver transplant in 06/2013, recurrent seizure like activity (stable without seizures on current regimen), mental retardation and paranoid type schizophrenia presenting to clinic today for a follow up on her weight.   Weight: Her weight has increased from 193lb on 01/24/15 to 202lb.  Walks a few blocks twice a week, she also walks around the house at night.  She does chair exercises for 40 minutes three times a week at daycare. She does get muffins/baked goods and juice at daycare. Sometimes she gets fruit packs (in syrup). She states the patients with DM get fresh fruit. She was put back on Depakote quite some time ago due to her seizures. She's had a history of behavioral issues (psychosis/hallucinations) on Vimpat and Keppra. She states her liver transplant doctors wan her on the Depakote and do not want to change to something else, despite the weight gain.   Patient's abdominal pain has improved. Normal bowel movements. States she saw Dr. Paulita Fujita with GI who said she was doing great and she didn't need to follow up for 6 months.   Social History: never smoker   Health maintenence: Will get flu vaccine in December with transplant MD (doesn't want her to get it too soon per her report).    ROS: All other systems reviewed and are negative.  Past Medical History Patient Active Problem List   Diagnosis Date Noted  . Impaired fasting blood sugar 01/24/2015  . Abnormality of gait 10/15/2014  . Amenorrhea 06/28/2014  . Encounter for cervical Pap smear with pelvic exam 05/12/2014  . Lumbar back pain 04/06/2014  . Health care maintenance 03/06/2014  . H/O liver transplant ([place]) 11/23/2013  . Thrombocytopenia ([place]) 03/09/2013  . Seizure disorder ([place]) 03/01/2013  . Schizophrenia, paranoid type  ([place]) 06/25/2012    Class: Chronic  . 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 Vision loss, bilateral 03/30/2012  . Primary biliary cirrhosis ([place]) 05/07/2011  . Unspecified vitamin D deficiency 09/13/2010  . Rectal bleeding 06/14/2010  . ANEMIA 03/03/2010  . GERD 07/21/2008  . 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 CARDIAC FLOW MURMUR 10/16/2007  . Convulsions/seizures ([place]) 08/23/2006  . Asthma with allergic rhinitis 07/30/2006  . Obesity 07/25/2006  . 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 Mental retardation 07/25/2006  . ASTHMA, PERSISTENT 07/25/2006  . OSTEOARTHRITIS, LOWER LEG 07/25/2006    Medications- reviewed and updated Current Outpatient Prescriptions  Medication Sig Dispense Refill  . ACCU-CHEK AVIVA PLUS test strip   0  . ACCU-CHEK SOFTCLIX LANCETS lancets Use to check blood sugar once daily in the morning prior to eating. 100 each 2  . acetaminophen (TYLENOL) 325 MG tablet Take 650 mg by mouth every 4 (four) hours as needed for mild pain or headache.     Marland Kitchen ammonium lactate (LAC-HYDRIN) 12 % cream Apply topically 2 (two) times daily.     . Azelastine HCl (ASTEPRO) 0.15 % SOLN Place 1 spray into the nose 2 (two) times daily as needed (allergic rhinitis). (Patient taking differently: Place 1 spray into the nose daily as needed (allergic rhinitis). ) 30 mL 2  . budesonide-formoterol (SYMBICORT) 160-4.5 MCG/ACT inhaler Inhale 2 puffs into the lungs 2 (two) times daily.  1 Inhaler   . calcium-vitamin D (CALCIUM + D) 250-125 MG-UNIT per tablet Take 2 tablets by  mouth 2 (two) times daily. Dinner & 7pm.    . carboxymethylcellulose (REFRESH TEARS) 0.5 % SOLN Place 1 drop into both eyes 4 (four) times daily as needed (for dry eyes).    . Cetirizine HCl 10 MG CAPS Take by mouth.    . cholecalciferol (VITAMIN D) 1000 UNITS tablet Take 2,000 Units by mouth daily.    . cycloSPORINE modified (NEORAL) 25 MG capsule Use in combination with 100 mg capsules for PM dose of 125 mg.  1  . cycloSPORINE  modified 100 MG CAPS Take 100-125 mg by mouth 2 (two) times daily. Take 100 mg in the AM and 125 mg in the PM    . divalproex (DEPAKOTE) 250 MG DR tablet Take 4 tablets (1,000 mg total) by mouth at bedtime. 120 tablet 6  . hydrOXYzine (ATARAX/VISTARIL) 25 MG tablet Take 25-50 mg by mouth at bedtime as needed.     . Hypromellose (ARTIFICIAL TEARS OP) Apply 1 drop to eye as needed.    . magnesium oxide (MAG-OX) 400 MG tablet Take 400 mg by mouth 2 (two) times daily. 5pm & 7pm.    . Melatonin 3 MG CAPS Take 3 mg by mouth at bedtime.    . montelukast (SINGULAIR) 10 MG tablet Take 10 mg by mouth at bedtime.  30 tablet 12  . Multiple Vitamin (MULTIVITAMIN) tablet Take 1 tablet by mouth daily. 9 am.    . mycophenolate (CELLCEPT) 250 MG capsule Take 500 mg by mouth 2 (two) times daily. 9 am & 9 pm.    . omeprazole (PRILOSEC) 40 MG capsule Take 40 mg by mouth daily. 9 am.    . polyethylene glycol (MIRALAX / GLYCOLAX) packet Take 17 g by mouth daily as needed (for constipation). In water or juice    . prazosin (MINIPRESS) 1 MG capsule Take 1 mg by mouth at bedtime.    Marland Kitchen PROAIR HFA 108 (90 BASE) MCG/ACT inhaler Inhale 1 puff into the lungs every 4 (four) hours as needed for shortness of breath.   0  . risperiDONE (RISPERDAL) 3 MG tablet Take 3 mg by mouth at bedtime.    . senna-docusate (SENOKOT-S) 8.6-50 MG per tablet Take 2 tablets by mouth 2 (two) times daily.     . ValGANciclovir HCl (VALCYTE PO) Take 900 mg by mouth daily.    . Wheat Dextrin (BENEFIBER DRINK MIX PO) Take 1 scoop by mouth 3 (three) times a week. Mon, Wed, Fri     No current facility-administered medications for this visit.    Objective: Office vital signs reviewed. BP 129/77 mmHg  Pulse 90  Temp(Src) 97.5 F (36.4 C) (Oral)  Wt 202 lb 3.2 oz (91.717 kg)  LMP 03/15/2015   Physical Examination:  General: Awake, alert, obese, NAD. Pleasant. Eyes: Conjunctiva non-injected.  Cardio: RRR, III/VI systolic murmur noted.  Pulm:  No increased WOB. CTAB, without wheezes, rhonchi or crackles noted.  Abd: +BS in all 4 quadrants. Soft. Non-distended. Non-tender. No rebound or guarding. No CVA tenderness.  MSK: Normal gait and station.  Skin: dry, intact, no rashes or lesions Neuro: A&Ox3.   Assessment/Plan: Obesity I am not sure how much lifestyle change the patient has made as her weight continues to increase. I am sure that the depakote is not helping, however we have no other options given her adverse reactions and h/o liver transplant.  - Wrote letter for daycare asking for carb modified diet with fresh fruit; if this is not available, they were asked to  remind her to bring her own snack. - patient due to lipid panel, will come fasting for her next appt with the nutritionist in 12/16 for this. - continue to follow up with nutritionist. - patient to follow up with me in 6 months or sooner as needed.     Orders Placed This Encounter  Procedures  . Lipid Panel    Standing Status: Future     Number of Occurrences:      Standing Expiration Date: 04/04/2016    Order Specific Question:  Has the patient fasted?    Answer:  Yes    No orders of the defined types were placed in this encounter.    [name] PGY-2, [place]

## 2015-04-07 NOTE — Assessment & Plan Note (Signed)
I am not sure how much lifestyle change the patient has made as her weight continues to increase. I am sure that the depakote is not helping, however we have no other options given her adverse reactions and h/o liver transplant.  - Wrote letter for daycare asking for carb modified diet with fresh fruit; if this is not available, they were asked to remind her to bring her own snack. - patient due to lipid panel, will come fasting for her next appt with the nutritionist in 12/16 for this. - continue to follow up with nutritionist. - patient to follow up with me in 6 months or sooner as needed.

## 2015-04-18 ENCOUNTER — Ambulatory Visit (INDEPENDENT_AMBULATORY_CARE_PROVIDER_SITE_OTHER): Payer: Medicaid Other | Admitting: Allergy and Immunology

## 2015-04-18 ENCOUNTER — Encounter: Payer: Self-pay | Admitting: Allergy and Immunology

## 2015-04-18 VITALS — BP 110/78 | HR 116 | Resp 20

## 2015-04-18 DIAGNOSIS — J4540 Moderate persistent asthma, uncomplicated: Secondary | ICD-10-CM

## 2015-04-18 DIAGNOSIS — H1010 Acute atopic conjunctivitis, unspecified eye: Secondary | ICD-10-CM | POA: Diagnosis not present

## 2015-04-18 DIAGNOSIS — J309 Allergic rhinitis, unspecified: Secondary | ICD-10-CM

## 2015-04-18 MED ORDER — MONTELUKAST SODIUM 10 MG PO TABS: 10.0000 mg | ORAL_TABLET | Freq: Every day | ORAL | Status: DC

## 2015-04-18 MED ORDER — AZELASTINE HCL 0.15 % NA SOLN: 1.0000 | Freq: Two times a day (BID) | NASAL | Status: DC | PRN

## 2015-04-18 MED ORDER — CETIRIZINE HCL 10 MG PO CAPS: 10.0000 mg | ORAL_CAPSULE | Freq: Every day | ORAL | Status: DC

## 2015-04-18 NOTE — Patient Instructions (Addendum)
Take Home Sheet  1. Avoidance: significant temperature changes and outdoor irritants (code orange and red days).   2. Antihistamine: Zyrtec by mouth once daily for runny nose or itching.   3. Nasal Spray: Saline nasal wash followed by Azelastine 1 spray(s) each nostril once to twice daily for stuffy nose or drainage.    4. Inhalers:  Rescue: ProAir 2 puffs every 4 hours as needed for cough or wheeze.       -May use 2 puffs 10-20 minutes prior to exercise.   Preventative: Symbicort 2 puffs twice daily (Rinse, gargle, and spit out after use).   5. Continue Singulair 10mg  each evening.   6.  Follow up Visit:  4-6 months or sooner if needed.    Plan to work towards decreasing Symbicort dose in the warmer weather.   Websites that have reliable Patient information: 1. American Academy of Asthma, Allergy, & Immunology: www.aaaai.org 2. Food Allergy Network: www.foodallergy.org 3. Mothers of Asthmatics: www.aanma.org 4. [employer]: DiningCalendar.de 5. American College of Allergy, Asthma, & Immunology: https://robertson.info/ or www.acaai.org

## 2015-04-18 NOTE — Progress Notes (Addendum)
FOLLOW UP NOTE  RE: [name] MRN: [id number] DOB: 1976/02/03 [facility] [place] [address] Date of Office Visit: 04/18/2015  Subjective:  [name] is a 39 y.o. female who presents today for Asthma   Assessment:   1. Moderate persistent asthma well controlled.  2. Allergic rhinoconjunctivitis.   3.      Complex medical history, including liver transplant on immunosuppressive therapy, developmental delay and seizure disorder.  Plan:   Meds ordered this encounter  Medications  . montelukast (SINGULAIR) 10 MG tablet    Sig: Take 1 tablet (10 mg total) by mouth at bedtime.    Dispense:  34 tablet    Refill:  5  . Cetirizine HCl 10 MG CAPS    Sig: Take 1 capsule (10 mg total) by mouth daily.    Dispense:  34 capsule    Refill:  5  . Azelastine HCl (ASTEPRO) 0.15 % SOLN    Sig: Place 1 spray into the nose 2 (two) times daily as needed (allergic rhinitis).    Dispense:  30 mL    Refill:  5   Patient Instructions   1. Avoidance: significant temperature changes and outdoor irritants (code orange and red days). 2. Antihistamine: Zyrtec 10 mg by mouth once daily for runny nose or itching. 3. Nasal Spray: Saline nasal wash followed by Azelastine 1 spray(s) each nostril once to twice daily for stuffy nose or drainage.  (Especially in these current days with the fluctuant weather patterns). 4. Inhalers:  Rescue: ProAir 2 puffs every 4 hours as needed for cough or wheeze.       -May use 2 puffs 10-20 minutes prior to exercise.  Preventative: Symbicort  158mcg 2 puffs twice daily (Rinse, gargle, and spit out after use). 5. Continue Singulair 10mg  each evening. 6.  Follow up Visit:  4-6 months or sooner if needed.    Plan to work towards decreasing Symbicort dose in the warmer weather.   HPI: [name] returns to the office in follow-up of asthma and allergic rhinoconjunctivitis.  Since her  last visit in May, she reports feeling good and denies cough, wheeze, difficulty breathing, shortness of breath, chest tightness or other symptoms..  She used her albuterol this summer for intermittent cough and wheeze on the very hot weather days but nothing daily.  She feels her sleep and activity (including chair exercise classes at her day center) are normal.  She denies emergency department or urgent care visits, prednisone or antibiotics or new concerns today.  There may be rare nasal congestion with slight postnasal drip when fluctuating, weather patterns present but nothing daily and she feels her medications are beneficial.  She is not using any nasal spray currently.  No other new concerns.  She continues to follow regularly with her other physicians.  Current Medications: 1.  Singulair 10 mg once daily. 2.  Symbicort 160 g 2 puffs twice daily. 3.  Zyrtec 10 mg once daily. 4.  Pro Air HFA 2 puffs every 4 as needed for cough or wheeze. 5.  Astelin as needed. 6.  Daily Cyclosporine, Depakote, magnesium, melatonin, multivitamin, CellCept, Prilosec Miralax, Minipress, Risperdal and vitamin D.  Drug Allergies: Allergies  Allergen Reactions  . Tramadol Hcl Itching and Nausea And Vomiting    Causes SEIZURES  . Aspirin Nausea And Vomiting    Due to Stomach ulcer  . Ibuprofen Other (See Comments)    Due  to stomach ulcer  . Penicillins Itching, Swelling and Rash    Breakout     . Pseudoephedrine Itching and Swelling    Tongue swelling  . Sulfa Antibiotics Nausea And Vomiting  . Latex Itching and Rash    Objective:   Filed Vitals:   04/18/15 1008  BP: 110/78  Pulse: 116  Resp: 20   Physical Exam  Constitutional: She is well-developed, well-nourished, and in no distress.  HENT:  Head: Atraumatic.  Right Ear: Tympanic membrane and ear canal normal.  Left Ear: Tympanic membrane and ear canal normal.  Nose: Mucosal edema present. No rhinorrhea. No epistaxis.  Mouth/Throat:  Oropharynx is clear and moist and mucous membranes are normal. No oropharyngeal exudate, posterior oropharyngeal edema or posterior oropharyngeal erythema.  Neck: Neck supple.  Cardiovascular: Normal rate, S1 normal and S2 normal.   No murmur heard. Pulmonary/Chest: Effort normal. She has no wheezes. She has no rhonchi. She has no rales.  Lymphadenopathy:    She has no cervical adenopathy.    Diagnostics: Spirometry:  FVC  2.17--89%, FEV1 1.96--94%.    [name] M. Ishmael Holter, MD  cc:  Kathrine Cords, MD

## 2015-04-28 ENCOUNTER — Encounter: Payer: Self-pay | Admitting: Family Medicine

## 2015-04-28 ENCOUNTER — Ambulatory Visit (INDEPENDENT_AMBULATORY_CARE_PROVIDER_SITE_OTHER): Payer: Medicaid Other | Admitting: Family Medicine

## 2015-04-28 ENCOUNTER — Other Ambulatory Visit: Payer: Medicaid Other

## 2015-04-28 ENCOUNTER — Ambulatory Visit: Payer: Self-pay | Admitting: Family Medicine

## 2015-04-28 VITALS — Ht 60.0 in | Wt 201.1 lb

## 2015-04-28 DIAGNOSIS — Z0000 Encounter for general adult medical examination without abnormal findings: Secondary | ICD-10-CM

## 2015-04-28 DIAGNOSIS — K743 Primary biliary cirrhosis: Secondary | ICD-10-CM

## 2015-04-28 DIAGNOSIS — E669 Obesity, unspecified: Secondary | ICD-10-CM | POA: Diagnosis not present

## 2015-04-28 LAB — LIPID PANEL
CHOL/HDL RATIO: 6 ratio — AB (ref <=5.0)
Cholesterol: 211 mg/dL — ABNORMAL HIGH (ref 125–200)
HDL: 35 mg/dL — AB (ref >=46)
LDL CALC: 129 mg/dL (ref <130)
TRIGLYCERIDES: 233 mg/dL — AB (ref <150)
VLDL: 47 mg/dL — AB (ref <30)

## 2015-04-28 NOTE — Progress Notes (Signed)
Patient ID: [name], female   DOB: Aug 18, 1975, 39 y.o.   MRN: [id number] Nutrition Visit:   HPI:  Primary concerns today: Weight management. (E66.9)  Change in progress; [place] gave a note to her aide and her daycare about her nutritional needs. Her daycare is now giving her fruit for dessert instead of sweets. Her aide is doing a better job of having vegetables in her lunches and dinners at home but her meals at daycare still don't always include vegetables. Overall she is now having sweets a few times a week instead of every day and eating more vegetables than she was before. This is the first time that her weight has decreased, this may be related to decreased sweets and increased veggies.     Barriers to learning/adherence to lifestyle change: Most foods are provided for her by others; uncle grocery shops for her; several meals and snacks are provided at daycare and at church functions each week.  Patient comprehension of recommendations not always clear.    Usual eating pattern includes 3-4 meals  and 1 snack per day.  24-hr recall: (Up at 5 AM) B (7 AM)-   1 piece sweet potato pie, water D (4 PM)-  3-4 c cabbage, 2 c chili beans, 2 pieces cornbread, water, 1 1/2 c  ice cream Typical day? No. Ran out of some foods, had leftovers from thanksgiving    Handouts given during visit include:  AVS  Demonstrated degree of understanding via:  Teach Back   A/P: Making slow progress in improving nutrition status and losing weight. Since last visit is down 1 pound despite Thanksgiving holiday and is eating more veggies and less sweets than previously.  - continue limiting sweets and sweet drinks to 2-3x per week - continue eating vegetables with every lunch and dinner where possible - decrease added fats like butter and cheese - f/u 2/2 for MD nutrition counseling

## 2015-04-28 NOTE — Progress Notes (Signed)
flp done today [facility]s

## 2015-04-28 NOTE — Patient Instructions (Signed)
Your weight has decreased by a pound since the last time we saw you! I believe this is related to eating more vegetables and fewer sweets. Please continue working on the goals below to help you continue to lose weight.   Most important dietary changes you can make: 1. Include vegetables with BOTH lunch and dinner. For example, instead of 3-4 cups of Beef-a-Roni, have 1-2 cups, and 2 cups of vegetables. This will give you a lot FEWER calories and a lot MORE nutrition.  2. Limit sweet desserts (cookies, candies, donuts, cake, etc) are "SOMETIMES" foods, not "EVERYDAY" foods.  - If you need a note from your doctor to say that you have a need for fresh fruit instead of these sweets, let us know.  3. Limit added fats like butter, cheese, mayonnaise, and salad dressing. This doesn't mean you can't eat any of these foods, but use the least amount you can use. For example, when you have grits, only add 2 tablespoons of cheese, not 1/2 cup (which is equal to 8 tablespoons).

## 2015-05-19 ENCOUNTER — Encounter: Payer: Self-pay | Admitting: Family Medicine

## 2015-05-19 ENCOUNTER — Ambulatory Visit (INDEPENDENT_AMBULATORY_CARE_PROVIDER_SITE_OTHER): Payer: Medicaid Other | Admitting: Family Medicine

## 2015-05-19 VITALS — BP 110/79 | HR 104 | Temp 98.1°F | Ht 60.0 in | Wt 204.9 lb

## 2015-05-19 DIAGNOSIS — J45909 Unspecified asthma, uncomplicated: Secondary | ICD-10-CM | POA: Diagnosis not present

## 2015-05-19 DIAGNOSIS — Z1239 Encounter for other screening for malignant neoplasm of breast: Secondary | ICD-10-CM | POA: Diagnosis not present

## 2015-05-19 DIAGNOSIS — Z0000 Encounter for general adult medical examination without abnormal findings: Secondary | ICD-10-CM

## 2015-05-19 DIAGNOSIS — Z23 Encounter for immunization: Secondary | ICD-10-CM | POA: Diagnosis present

## 2015-05-19 MED ORDER — AZELASTINE HCL 0.15 % NA SOLN: 1.0000 | Freq: Two times a day (BID) | NASAL | Status: DC | PRN

## 2015-05-19 NOTE — Patient Instructions (Addendum)
1. Avoidance: significant temperature changes and outdoor irritants (code orange and red days). 2. Antihistamine: Zyrtec 10 mg by mouth once daily for runny nose or itching. 3. Nasal Spray: Saline nasal wash followed by Azelastine 1 spray(s) each nostril once to twice daily for stuffy nose or drainage. (Especially in these current days with the fluctuant weather patterns).  Things to do to Keep yourself Healthy - Exercise at least 30-45 minutes a day,  3-4 days a week.  - Eat a low-fat diet with lots of fruits and vegetables, up to 7-9 servings per day. - Seatbelts can save your life. Wear them always. - Smoke detectors on every level of your home, check batteries every year. - Eye Doctor - have an eye exam every 1-2 years - [place].  Choose someone to speak for you if you are not able. - Depression is common in our stressful world.If you're feeling down or losing interest in things you normally enjoy, please come in for a visit. - Violence - If anyone is threatening or hurting you, please call immediately.

## 2015-05-19 NOTE — Progress Notes (Signed)
Patient ID: [name], female   DOB: December 29, 1975, 39 y.o.   MRN: [id number] 39 y.o. year old female presents for well woman/preventative visit and annual GYN examination.  Acute Concerns: Nasal and chest congestion that started on Monday.  Wheezing started on Tuesday when she lays down at night. She wakes up with mucous in the back of her throat.  Intermittent roductive cough with yellow phlegm bought on by the cold.  No subjective fevers, myalgias, sneezing, otalgias, some people at her church are sick.  Has been taking Symbicort and albuterol. She's been taking albuterol every 4 hours for 2 times. The albuterol helped a little. Also doing Singulair and Cetirizine.  She's only using Flonase currently, not the Astepro that was refilled by her allergist 04/18/15.   Diet: Dietary indiscretion due to the holidays.  Exercise: Walks down the street and back two times a week, it takes about 15 minutes to do this walk. She's still walking around the house. Doing chair exercises at daycare 3x/week but hasn't gone in the last 2 weeks.  Sexual/Birth History: G0, LMP 05/19/15, heavy menses, bleeds for 4-5 days, regular.   Birth Control: Patient has never been sexually active.  Review of Systems  Constitutional: Negative for fever and weight loss.  HENT: Positive for congestion. Negative for ear pain, hearing loss and sore throat.   Eyes: Negative for blurred vision, pain and redness.  Respiratory: Positive for cough and wheezing. Negative for hemoptysis, shortness of breath and stridor.   Cardiovascular: Negative for chest pain.  Gastrointestinal: Negative for heartburn, vomiting, abdominal pain, diarrhea and constipation.  Genitourinary: Negative for dysuria.  Musculoskeletal: Negative for myalgias.  Skin: Negative for rash.  Neurological: Negative for dizziness, weakness and headaches.  Endo/Heme/Allergies: Negative for environmental allergies.  Psychiatric/Behavioral: Negative for depression.  The patient does not have insomnia.     Social:  Social History   Social History  . Marital Status: Single    Spouse Name: N/A  . Number of Children: 0  . Years of Education: 12 th   Occupational History  .      Disabled   Social History Main Topics  . Smoking status: Never Smoker   . Smokeless tobacco: Never Used  . Alcohol Use: No  . Drug Use: No  . Sexual Activity: No   Other Topics Concern  . None   Social History Narrative   Lives alone but uncle and his wife live next door and are her primary caregivers. Sonia Side (uncle) can be reached at [phone].    Education High school education.   Disabled.   Right handed.   Caffeine tea and coffee daily.    Immunization: Immunization History  Administered Date(s) Administered  . H1N1 05/10/2008  . Influenza Split 02/09/2011, 02/13/2012  . Influenza Whole 02/20/2007, 02/16/2008, 03/04/2009, 03/03/2010  . Influenza,inj,Quad PF,36+ Mos 03/09/2013, 03/05/2014, 05/19/2015  . PPD Test 04/27/2013, 06/22/2013  . Pneumococcal Polysaccharide-23 03/01/2013  . Td 05/29/2003, 02/07/2010    Cancer Screening:  Pap Smear: 05/11/15, negative for lesions or malignancy, negative HR HPV.   Mammogram: Never had a mammogram, she's had multiple cousins with breast cancer.  Influenza vaccine- provided today.   Physical Exam: Blood pressure 110/79, pulse 104, temperature 98.1 F (36.7 C), temperature source Oral, height 5' (1.524 m), weight 204 lb 14.4 oz (92.942 kg), last menstrual period 05/19/2015, SpO2 100 %.  GEN: Pleasant female, NAD HEENT: Normocephalic, PERRL, EOMI, no scleral icterus, bilateral TM pearly grey, nasal septum midline, MMM, uvula midline,  no anterior or posterior lymphadenopathy, no thyromegaly CARDIAC:RRR, III/VI systolic murmur. No pitting edema RESP: CTAB, normal effort ABD: soft, no tenderness, normal bowel sounds EXT: No edema, 2+ radial and DP pulses SKIN: no rash  ASSESSMENT & PLAN: 39 y.o. female presents  for annual well woman/preventative exam and GYN exam. Gyn exam was deferred as patient currently having a heavy menses.  Please see problem specific assessment and plan.   Asthma with allergic rhinitis Symptoms consistent with allergic rhinitis. No evidence of infection, lungs clear on my exam without increased WOB and afebrile.  - Discussed avoiding significant temperature changes and outdoor irritants  - Zyrtec 10 mg by mouth once daily for runny nose or itching. - Saline nasal wash followed by Azelastine 1 spray(s) each nostril once to twice daily congestion/rhinorrhea.  - RTC precautions discussed.   Health care maintenance Influenza vaccine given today. Discussed results of lipid panel and need to increase exercise and improve diet, would like to avoid a statin in this patient given her h/o liver transplant. Given she's on immunosuppressants, will need cervical cancer screening annually, pt to f/u when she's off her cycle.  Given the patient's age a family h/o breast cancer, placed order for mammogram.

## 2015-05-19 NOTE — Assessment & Plan Note (Addendum)
Symptoms consistent with allergic rhinitis. No evidence of infection, lungs clear on my exam without increased WOB and afebrile.  - Discussed avoiding significant temperature changes and outdoor irritants  - Zyrtec 10 mg by mouth once daily for runny nose or itching. - Saline nasal wash followed by Azelastine 1 spray(s) each nostril once to twice daily congestion/rhinorrhea.  - RTC precautions discussed.

## 2015-05-20 NOTE — Assessment & Plan Note (Addendum)
Influenza vaccine given today. Discussed results of lipid panel and need to increase exercise and improve diet, would like to avoid a statin in this patient given her h/o liver transplant. Given she's on immunosuppressants, will need cervical cancer screening annually, pt to f/u when she's off her cycle.  Given the patient's age a family h/o breast cancer, placed order for mammogram.

## 2015-06-01 ENCOUNTER — Telehealth: Payer: Self-pay | Admitting: Family Medicine

## 2015-06-01 NOTE — Telephone Encounter (Signed)
Pt called today asking for dr to call in something for her cold. She is sneezing, nose running and coughting

## 2015-06-01 NOTE — Telephone Encounter (Signed)
Pt is calling and would like to speak to the doctor about her cold and also see if the doctor will call in something. jw

## 2015-06-02 NOTE — Telephone Encounter (Signed)
Patient with sneezing, running nose, cough which started yesterday. No fevers or SOB.   She has been been doing Robitussin for diabetics which is helping some.  She's been doing Azelastine once daily. Suggested she started doing Azelastine twice daily for the rhinorrhea.  Patient to call back if symptoms worsen or does not improve.  [name], MD [facility] Resident

## 2015-06-09 ENCOUNTER — Encounter: Payer: Self-pay | Admitting: Adult Health

## 2015-06-09 ENCOUNTER — Ambulatory Visit (INDEPENDENT_AMBULATORY_CARE_PROVIDER_SITE_OTHER): Payer: Medicaid Other | Admitting: Adult Health

## 2015-06-09 VITALS — BP 119/82 | HR 111 | Ht 60.0 in | Wt 210.5 lb

## 2015-06-09 DIAGNOSIS — Z5181 Encounter for therapeutic drug level monitoring: Secondary | ICD-10-CM

## 2015-06-09 DIAGNOSIS — R569 Unspecified convulsions: Secondary | ICD-10-CM | POA: Diagnosis not present

## 2015-06-09 NOTE — Progress Notes (Signed)
PATIENT: [name] DOB: September 09, 1975  REASON FOR VISIT: follow up-  seizures HISTORY FROM: patient  HISTORY OF PRESENT ILLNESS: [name]  Is a 40 year old female with a history of seizures. She returns today for follow-up. She continues to take Depakote at bedtime. She denies any seizure events. She is able to complete all ADLs independently. She lives alone however she has an aide that is with her daily. She does not operate a motor vehicle. Patient states that she is doing well. Denies any new neurological symptoms. She returns today for an evaluation. HISTORY 12/07/14: [name]  hadis a 40 year old female with a history of seizures. She returns today for follow-up. She is currently taking Depakote 250 mg 4 tablets at bedtime. She denies any seizure events. She continues to live alone. She is able to complete all ADLs independently. Denies any changes with her gait or balance. She states that her uncle checks on her frequently and she also has an aide that comes in and helps in the afternoons. Denies any new neurological symptoms. She returns today for an evaluation.  HISTORY 06/08/14: [name] is a 40 year old female with a history of seizures. She returns today for follow-up. She is currently taking Depakote 250 mg 5 tablets at bedtime. She states that she hasn't had a seizure since the last visit. The patient does have a mild history of mental retardation and she is alone today. Patient does not operate a motor vehicle. She states that she lives alone but her uncle lives beside of her. She has an Aid that helps her in the afternoons and goes to daycare on Monday, Thursday and Friday. Denies any difficulty with gait or balance.   HISTORY 03/23/14 [name]): Ms. Carlee, 40 year old black female returns today for followup of epilepsy. Jere Blassingame. She has a history of mild mental retardation, and long-standing history of seizures. She was well-controlled on Depakote and Tegretol extended  release. But she has developed liver disease, was diagnosed with primary biliary cirrhosis,Antieptical medication was changed since February 2013, she is now taking Vimpat 100 mg 2 tabs twice a day, Keppra 500 mg 3 tabs daily. She has no significant seizure activities while taking Vimpat, and Keppra.She lives alone, uncles lives next door, she attends adult daycare regularly.She underwent liver transplantation in February 2015, doing very well, on polypharmacy treatment, this including CellCept 1000 mg twice a day, Prograft 4 mg twice a day,For her seizure, she is now taking deapkote ER 250mg  5 tabs qhs, she is having seizure about once amonth, it usually happens at nighttime, she was noted to have nighttime extremity jumping movement, tongue biting when she woke up, she also complains of mild gait difficullty,  UPDATE Oct 27th 2015:I have talked with [name] liver transplant cordinator, reported, 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, at [place], Keppra, Vimpat cause worsening behavior issue, she had a hallucinations, paranoid to the point of almost institutionalized, her mood disorder eventually was under control with Depakote ER 250 mg 5 tablets each day, Jawana continues to complains that she had a 2 episode of working up with blood in her mouth, has true to her tongue, one in July, the second one was September 2015, overall she is tolerating the medication well, Depakote level was 63 in February 23 2014, EEG was normal.Repeat laboratory evaluation in March 09 2014, showed decreased white count 1.6, she is receiving weekly laboratory evaluation by [name] transplant team, I have called coordinator again, is aware  of her leukopenia, she is on lower dose of immunosuppressive treatment, now taking cyclosporine 200 mg twice a day, CellCept 250 mg twice a day,She has personal aid every evening  REVIEW OF SYSTEMS: Out of a complete 14 system review of symptoms, the patient complains only of the following  symptoms, and all other reviewed systems are negative.   runny nose, cough, wheezing, chest tightness  ALLERGIES: Allergies  Allergen Reactions  . Tramadol Hcl Itching and Nausea And Vomiting    Causes SEIZURES  . Aspirin Nausea And Vomiting    Due to Stomach ulcer  . Ibuprofen Other (See Comments)    Due to stomach ulcer  . Penicillins Itching, Swelling and Rash    Breakout     . Pseudoephedrine Itching and Swelling    Tongue swelling  . Sulfa Antibiotics Nausea And Vomiting  . Latex Itching and Rash    HOME MEDICATIONS: Outpatient Prescriptions Prior to Visit  Medication Sig Dispense Refill  . ACCU-CHEK AVIVA PLUS test strip   0  . ACCU-CHEK SOFTCLIX LANCETS lancets Use to check blood sugar once daily in the morning prior to eating. 100 each 2  . acetaminophen (TYLENOL) 325 MG tablet Take 650 mg by mouth every 4 (four) hours as needed for mild pain or headache.     Marland Kitchen ammonium lactate (LAC-HYDRIN) 12 % cream Apply topically 2 (two) times daily.     . Azelastine HCl (ASTEPRO) 0.15 % SOLN Place 1 spray into the nose 2 (two) times daily as needed (allergic rhinitis). 30 mL 1  . budesonide-formoterol (SYMBICORT) 160-4.5 MCG/ACT inhaler Inhale 2 puffs into the lungs 2 (two) times daily.  1 Inhaler   . carboxymethylcellulose (REFRESH TEARS) 0.5 % SOLN Place 1 drop into both eyes 4 (four) times daily as needed (for dry eyes).    . Cetirizine HCl 10 MG CAPS Take 1 capsule (10 mg total) by mouth daily. 34 capsule 5  . cholecalciferol (VITAMIN D) 1000 UNITS tablet Take 2,000 Units by mouth daily.    . cycloSPORINE modified (NEORAL) 25 MG capsule Use in combination with 100 mg capsules for PM dose of 125 mg.  1  . cycloSPORINE modified 100 MG CAPS Take 100-125 mg by mouth 2 (two) times daily. Take 100 mg in the AM and 125 mg in the PM    . divalproex (DEPAKOTE) 250 MG DR tablet Take 4 tablets (1,000 mg total) by mouth at bedtime. 120 tablet 6  . hydrOXYzine (ATARAX/VISTARIL) 25 MG tablet  Take 25-50 mg by mouth at bedtime as needed.     . Hypromellose (ARTIFICIAL TEARS OP) Apply 1 drop to eye as needed.    . magnesium oxide (MAG-OX) 400 MG tablet Take 400 mg by mouth 2 (two) times daily. 5pm & 7pm.    . montelukast (SINGULAIR) 10 MG tablet Take 1 tablet (10 mg total) by mouth at bedtime. 34 tablet 5  . Multiple Vitamin (MULTIVITAMIN) tablet Take 1 tablet by mouth daily. 9 am.    . mycophenolate (CELLCEPT) 250 MG capsule Take 500 mg by mouth 2 (two) times daily. 9 am & 9 pm.    . omeprazole (PRILOSEC) 40 MG capsule Take 40 mg by mouth daily. 9 am.    . polyethylene glycol (MIRALAX / GLYCOLAX) packet Take 17 g by mouth daily as needed (for constipation). In water or juice    . prazosin (MINIPRESS) 1 MG capsule Take 1 mg by mouth at bedtime.    Marland Kitchen PROAIR HFA 108 (90  BASE) MCG/ACT inhaler Inhale 1 puff into the lungs every 4 (four) hours as needed for shortness of breath.   0  . risperiDONE (RISPERDAL) 3 MG tablet Take 3 mg by mouth at bedtime.    . senna-docusate (SENOKOT-S) 8.6-50 MG per tablet Take 2 tablets by mouth 2 (two) times daily.     . ValGANciclovir HCl (VALCYTE PO) Take 900 mg by mouth daily.    . Wheat Dextrin (BENEFIBER DRINK MIX PO) Take 1 scoop by mouth 3 (three) times a week. Mon, Wed, Fri     No facility-administered medications prior to visit.    PAST MEDICAL HISTORY: Past Medical History  Diagnosis Date  . Allergy   . Anemia   . Intellectual disability   . Constipation   . Abnormal vaginal bleeding   . Biliary cirrhosis ([place]) 04/26/11  . Primary biliary cirrhosis ([place]) 05/07/2011  . DUB (dysfunctional uterine bleeding)   . PONV (postoperative nausea and vomiting)   . Seizures ([place])     congenital epilepsy-hx. seizures  . Asthma 09-23-12    Asthma somewhat causing increase wheezing and mild congestion at present  . GERD (gastroesophageal reflux disease)   . Arthritis     right knee  . Vitamin D deficiency     PAST SURGICAL HISTORY: Past Surgical  History  Procedure Laterality Date  . Appendectomy    . Cholecystectomy    . Eye muscle surgery    . Eustacean tubes    . Colonoscopy w/ biopsies    . Adenonoidectomy    . Esophagogastroduodenoscopy (egd) with propofol N/A 10/01/2012    Procedure: ESOPHAGOGASTRODUODENOSCOPY (EGD) WITH PROPOFOL;  Surgeon: Arta Silence, MD;  Location: [facility];  Service: Endoscopy;  Laterality: N/A;  . Liver transplant      FAMILY HISTORY: Family History  Problem Relation Age of Onset  . Diabetes Mother   . Heart disease Mother   . COPD Mother     SOCIAL HISTORY: Social History   Social History  . Marital Status: Single    Spouse Name: N/A  . Number of Children: 0  . Years of Education: 12 th   Occupational History  .      Disabled   Social History Main Topics  . Smoking status: Never Smoker   . Smokeless tobacco: Never Used  . Alcohol Use: No  . Drug Use: No  . Sexual Activity: No   Other Topics Concern  . Not on file   Social History Narrative   Lives alone but uncle and his wife live next door and are her primary caregivers. Sonia Side (uncle) can be reached at [phone].    Education High school education.   Disabled.   Right handed.   Caffeine tea and coffee daily.      PHYSICAL EXAM  Filed Vitals:   06/09/15 0935  BP: 119/82  Pulse: 111  Height: 5' (1.524 m)  Weight: 210 lb 8 oz (95.482 kg)   Body mass index is 41.11 kg/(m^2).  Generalized: Well developed, in no acute distress   Neurological examination  Mentation: Alert oriented to time, place, history taking. Follows all commands speech and language fluent Cranial nerve II-XII: Pupils were equal round reactive to light. Extraocular movements were full, visual field were full on confrontational test. Facial sensation and strength were normal. Uvula tongue midline. Head turning and shoulder shrug  were normal and symmetric. Motor: The motor testing reveals 5 over 5 strength of all 4 extremities. Good symmetric  motor tone is  noted throughout.  Sensory: Sensory testing is intact to soft touch on all 4 extremities. No evidence of extinction is noted.  Coordination: Cerebellar testing reveals good finger-nose-finger and heel-to-shin bilaterally.  Gait and station: Gait is normal. Tandem gait is normal. Romberg is negative. No drift is seen.  Reflexes: Deep tendon reflexes are symmetric and normal bilaterally.   DIAGNOSTIC DATA (LABS, IMAGING, TESTING) - I reviewed patient records, labs, notes, testing and imaging myself where available.  Lab Results  Component Value Date   WBC 2.7* 12/07/2014   HGB 9.8* 06/08/2014   HCT 28.4* 12/07/2014   MCV 85 12/07/2014   PLT 130* 12/07/2014      Component Value Date/Time   NA 141 12/07/2014 1052   NA 138 06/28/2014 0932   K 4.6 12/07/2014 1052   CL 107 12/07/2014 1052   CO2 22 12/07/2014 1052   GLUCOSE 100* 12/07/2014 1052   GLUCOSE 104* 06/28/2014 0932   BUN 19 12/07/2014 1052   BUN 24* 06/28/2014 0932   CREATININE 0.86 12/07/2014 1052   CREATININE 0.83 06/28/2014 0932   CALCIUM 8.5* 12/07/2014 1052   CALCIUM 9.0 09/11/2007 2301   PROT 6.7 12/07/2014 1052   PROT 6.7 06/28/2014 0932   ALBUMIN 3.7 12/07/2014 1052   ALBUMIN 3.7 06/28/2014 0932   AST 13 12/07/2014 1052   ALT 8 12/07/2014 1052   ALKPHOS 58 12/07/2014 1052   BILITOT 0.3 12/07/2014 1052   BILITOT 0.5 06/28/2014 0932   GFRNONAA 85 12/07/2014 1052   GFRNONAA >89 11/09/2011 1208   GFRAA 98 12/07/2014 1052   GFRAA >89 11/09/2011 1208   Lab Results  Component Value Date   CHOL 211* 04/28/2015   HDL 35* 04/28/2015   LDLCALC 129 04/28/2015   LDLDIRECT 64 07/10/2011   TRIG 233* 04/28/2015   CHOLHDL 6.0* 04/28/2015   Lab Results  Component Value Date   HGBA1C 5.1 01/24/2015   Lab Results  Component Value Date   VITAMINB12 616 08/13/2007   Lab Results  Component Value Date   TSH 3.524 09/03/2014      ASSESSMENT AND PLAN 40 y.o. year old female  has a past medical  history of Allergy; Anemia; Intellectual disability; Constipation; Abnormal vaginal bleeding; Biliary cirrhosis ([place]) (04/26/11); Primary biliary cirrhosis ([place]) (05/07/2011); DUB (dysfunctional uterine bleeding); PONV (postoperative nausea and vomiting); Seizures ([place]); Asthma (09-23-12); GERD (gastroesophageal reflux disease); Arthritis; and Vitamin D deficiency. here with:   1.  Seizures   Overall the patient is doing well. She will continue on Depakote. I will check blood work today. Patient advised that if she has any seizure event she should let us know. She will follow-up in 6 months with Dr. [name].   Ward Givens, MSN, NP-C 06/09/2015, 9:44 AM [facility] [address], [place], [place] [address] [phone]

## 2015-06-09 NOTE — Patient Instructions (Signed)
Continue Depakote Will check blood work today If you have any seizures please let us know

## 2015-06-09 NOTE — Progress Notes (Signed)
I have reviewed and agreed above plan. 

## 2015-06-10 ENCOUNTER — Telehealth: Payer: Self-pay | Admitting: *Deleted

## 2015-06-10 LAB — COMPREHENSIVE METABOLIC PANEL
A/G RATIO: 1.3 (ref 1.1–2.5)
ALK PHOS: 63 IU/L (ref 39–117)
ALT: 7 IU/L (ref 0–32)
AST: 11 IU/L (ref 0–40)
Albumin: 3.9 g/dL (ref 3.5–5.5)
BILIRUBIN TOTAL: 0.3 mg/dL (ref 0.0–1.2)
BUN/Creatinine Ratio: 29 — ABNORMAL HIGH (ref 9–23)
BUN: 23 mg/dL (ref 6–24)
CHLORIDE: 104 mmol/L (ref 96–106)
CO2: 22 mmol/L (ref 18–29)
Calcium: 8.9 mg/dL (ref 8.7–10.2)
Creatinine, Ser: 0.79 mg/dL (ref 0.57–1.00)
GFR calc Af Amer: 108 mL/min/{1.73_m2} (ref >59)
GFR calc non Af Amer: 93 mL/min/{1.73_m2} (ref >59)
GLOBULIN, TOTAL: 3 g/dL (ref 1.5–4.5)
Glucose: 103 mg/dL — ABNORMAL HIGH (ref 65–99)
POTASSIUM: 4.9 mmol/L (ref 3.5–5.2)
SODIUM: 140 mmol/L (ref 134–144)
Total Protein: 6.9 g/dL (ref 6.0–8.5)

## 2015-06-10 LAB — CBC WITH DIFFERENTIAL/PLATELET
BASOS ABS: 0 10*3/uL (ref 0.0–0.2)
Basos: 0 %
EOS (ABSOLUTE): 0.2 10*3/uL (ref 0.0–0.4)
Eos: 5 %
Hematocrit: 29.7 % — ABNORMAL LOW (ref 34.0–46.6)
Hemoglobin: 9.6 g/dL — ABNORMAL LOW (ref 11.1–15.9)
IMMATURE GRANS (ABS): 0 10*3/uL (ref 0.0–0.1)
IMMATURE GRANULOCYTES: 1 %
LYMPHS: 45 %
Lymphocytes Absolute: 1.7 10*3/uL (ref 0.7–3.1)
MCH: 27.4 pg (ref 26.6–33.0)
MCHC: 32.3 g/dL (ref 31.5–35.7)
MCV: 85 fL (ref 79–97)
MONOS ABS: 0.6 10*3/uL (ref 0.1–0.9)
Monocytes: 16 %
NEUTROS PCT: 33 %
Neutrophils Absolute: 1.2 10*3/uL — ABNORMAL LOW (ref 1.4–7.0)
PLATELETS: 153 10*3/uL (ref 150–379)
RBC: 3.51 x10E6/uL — ABNORMAL LOW (ref 3.77–5.28)
RDW: 15.1 % (ref 12.3–15.4)
WBC: 3.7 10*3/uL (ref 3.4–10.8)

## 2015-06-10 LAB — VALPROIC ACID LEVEL: VALPROIC ACID LVL: 78 ug/mL (ref 50–100)

## 2015-06-10 NOTE — Telephone Encounter (Signed)
I called and spoke to pt and relayed that her lab results were ok.  She verbalized understanding.

## 2015-06-10 NOTE — Telephone Encounter (Signed)
-----   Message from Ward Givens, NP sent at 06/10/2015 10:54 AM EST ----- Please call the patient- lab work ok

## 2015-06-16 ENCOUNTER — Ambulatory Visit (INDEPENDENT_AMBULATORY_CARE_PROVIDER_SITE_OTHER): Payer: Medicaid Other | Admitting: Family Medicine

## 2015-06-16 ENCOUNTER — Encounter: Payer: Self-pay | Admitting: Family Medicine

## 2015-06-16 VITALS — BP 126/76 | HR 124 | Temp 98.3°F | Ht 60.0 in | Wt 207.9 lb

## 2015-06-16 DIAGNOSIS — J0110 Acute frontal sinusitis, unspecified: Secondary | ICD-10-CM | POA: Diagnosis present

## 2015-06-16 DIAGNOSIS — J019 Acute sinusitis, unspecified: Secondary | ICD-10-CM | POA: Insufficient documentation

## 2015-06-16 MED ORDER — DOXYCYCLINE HYCLATE 100 MG PO TABS: 100.0000 mg | ORAL_TABLET | Freq: Two times a day (BID) | ORAL | Status: DC

## 2015-06-16 NOTE — Progress Notes (Signed)
   Subjective:   Taliana A Martus is a 40 y.o. female with a history of asthma, MR, seizures here for congestion  URI  Has been sick for 19 days. Nasal discharge: yellow, bilateral Medications tried: tussin Sick contacts: several at daycare  Symptoms Fever: no Headache or face pain: no Tooth pain: no Sneezing: no Scratchy throat: yes Allergies: yes Muscle aches: no Severe fatigue: no Stiff neck: no Shortness of breath: no Rash: no Sore throat or swollen glands: no   Review of Systems:  Per HPI. All other systems reviewed and are negative.   PMH, PSH, Medications, Allergies, and FmHx reviewed and updated in EMR.  Social History: never smoker  Objective:  BP 126/76 mmHg  Pulse 124  Temp(Src) 98.3 F (36.8 C) (Oral)  Ht 5' (1.524 m)  Wt 207 lb 14.4 oz (94.303 kg)  BMI 40.60 kg/m2  LMP 05/19/2015  Gen:  40 y.o. female in NAD HEENT: NCAT, MMM, EOMI, PERRL, anicteric sclerae, crusted rhinorrhea, turbinate erythema CV: RRR, no MRG, no JVD Resp: Non-labored, CTAB, no wheezes noted Abd: Soft, NTND, BS present, no guarding or organomegaly Ext: WWP, no edema MSK: Full ROM, strength intact Neuro: Alert and oriented, speech normal      Chemistry      Component Value Date/Time   NA 140 06/09/2015 0957   NA 138 06/28/2014 0932   K 4.9 06/09/2015 0957   CL 104 06/09/2015 0957   CO2 22 06/09/2015 0957   BUN 23 06/09/2015 0957   BUN 24* 06/28/2014 0932   CREATININE 0.79 06/09/2015 0957   CREATININE 0.83 06/28/2014 0932      Component Value Date/Time   CALCIUM 8.9 06/09/2015 0957   CALCIUM 9.0 09/11/2007 2301   ALKPHOS 63 06/09/2015 0957   AST 11 06/09/2015 0957   ALT 7 06/09/2015 0957   BILITOT 0.3 06/09/2015 0957   BILITOT 0.5 06/28/2014 0932      Lab Results  Component Value Date   WBC 3.7 06/09/2015   HGB 9.8* 06/08/2014   HCT 29.7* 06/09/2015   MCV 85 06/09/2015   PLT 153 06/09/2015   Lab Results  Component Value Date   TSH 3.524 09/03/2014   Lab  Results  Component Value Date   HGBA1C 5.1 01/24/2015   Assessment & Plan:     Stacyann A Siefring is a 40 y.o. female here for congestion  Sinusitis, acute Persistent congestion and cough for nearly 3 weeks. Some facial pressure but no pain. No fevers. Given duration of illness and asthmatic patient will treat for bacterial sinusitis - doxy x10 days (pcn allergy) - f/u prn worsening - ok to continue tussin prn cough     Beverlyn Roux, MD, MPH [facility] PGY-3 06/16/2015 9:30 AM

## 2015-06-16 NOTE — Patient Instructions (Signed)

## 2015-06-16 NOTE — Assessment & Plan Note (Signed)
Persistent congestion and cough for nearly 3 weeks. Some facial pressure but no pain. No fevers. Given duration of illness and asthmatic patient will treat for bacterial sinusitis - doxy x10 days (pcn allergy) - f/u prn worsening - ok to continue tussin prn cough

## 2015-06-23 ENCOUNTER — Telehealth: Payer: Self-pay | Admitting: *Deleted

## 2015-06-23 NOTE — Telephone Encounter (Signed)
Patient called stating she received a pneumonia vaccine and Hepatitis vaccine at [facility] today.  Patient did not know with the Hepatitis was A or B.  Advised patient to get a copy of immunizations at her next appointment with [name].  She stated understanding.  Derl Barrow, RN

## 2015-06-29 ENCOUNTER — Telehealth: Payer: Self-pay | Admitting: Family Medicine

## 2015-06-29 NOTE — Telephone Encounter (Signed)
Please call the patient and ask her to make a same day appointment with any provider if she starts noticing pain, swelling, redness, or warmth of that right ankle.  Thanks, [employer]

## 2015-06-29 NOTE — Telephone Encounter (Signed)
Home Health Nurse called to inform the doctor that the patient fell on a concrete sidewalk outside her home. She hurt her right ankle but was able to stand and walk and is doing good. jw

## 2015-06-30 ENCOUNTER — Telehealth: Payer: Self-pay | Admitting: Family Medicine

## 2015-06-30 ENCOUNTER — Ambulatory Visit: Payer: Self-pay | Admitting: Family Medicine

## 2015-06-30 NOTE — Telephone Encounter (Signed)
Called patient to check on her ankle. She said she had some pain but no swelling etc. I did offer her an appt but she said "not right now". Deseree Kennon Holter, CMA

## 2015-06-30 NOTE — Telephone Encounter (Signed)
Per patient [name] records there are the following immunizations: PCV 13: 06/23/15 HAV Dose 1: 06/23/15 HBV Dose 1: 06/23/15  Influenza up to date  [name], MD [facility] Resident  06/30/2015, 1:21 PM

## 2015-07-14 ENCOUNTER — Other Ambulatory Visit: Payer: Self-pay | Admitting: Family Medicine

## 2015-07-14 DIAGNOSIS — Z1231 Encounter for screening mammogram for malignant neoplasm of breast: Secondary | ICD-10-CM

## 2015-07-27 ENCOUNTER — Ambulatory Visit: Payer: Self-pay

## 2015-08-11 ENCOUNTER — Ambulatory Visit: Payer: Self-pay

## 2015-08-18 ENCOUNTER — Ambulatory Visit: Payer: Self-pay

## 2015-09-01 ENCOUNTER — Ambulatory Visit
Admission: RE | Admit: 2015-09-01 | Discharge: 2015-09-01 | Disposition: A | Discharge status: Home / Self Care | Payer: Medicaid Other | Source: Ambulatory Visit | Attending: Family Medicine | Admitting: Family Medicine

## 2015-09-01 DIAGNOSIS — Z1231 Encounter for screening mammogram for malignant neoplasm of breast: Secondary | ICD-10-CM

## 2015-09-13 ENCOUNTER — Telehealth: Payer: Self-pay | Admitting: Allergy and Immunology

## 2015-09-13 NOTE — Telephone Encounter (Signed)
She called in saying that her medicaid will not pay for Azelastine now and wants to know if she can be prescribed something different.  Uses [facility] on [address]

## 2015-09-14 ENCOUNTER — Other Ambulatory Visit: Payer: Self-pay

## 2015-09-14 MED ORDER — AZELASTINE HCL 0.15 % NA SOLN: 1.0000 | Freq: Two times a day (BID) | NASAL | Status: DC | PRN

## 2015-09-14 NOTE — Telephone Encounter (Signed)
Sent in correct azelastine

## 2015-09-15 ENCOUNTER — Ambulatory Visit (INDEPENDENT_AMBULATORY_CARE_PROVIDER_SITE_OTHER): Payer: Medicaid Other | Admitting: Family Medicine

## 2015-09-15 ENCOUNTER — Encounter: Payer: Self-pay | Admitting: Family Medicine

## 2015-09-15 VITALS — Ht 60.0 in | Wt 215.3 lb

## 2015-09-15 DIAGNOSIS — E669 Obesity, unspecified: Secondary | ICD-10-CM

## 2015-09-15 NOTE — Progress Notes (Signed)
Muranda A Ford is a 40 y.o. female presenting for nutritional counseling for obesity.   Learning Readiness:   Ready/Change in progress  Usual eating pattern includes 3 meals and 0 -1 snacks per day. Frequent foods and beverages include hamburger 1x/wk, chicken, vegetables (cauliflower, broccoli, carrots), yogurt, sugar free pudding. Drinks water, milk, iced tea with artificial sweetener, sometimes root beer on holidays. Usual physical activity includes walking, "sweat to the oldies" with Charlean Sanfilippo, chair exercise at adult daycare (goes 3 times per week).   24-hr recall: (Up at 4 AM) B ( 8 AM)-  pack of peanuts, bottle water  L (12:30PM)-  Canned chicken dumpling soup (2c), tomatoes (1/2c), 1 green peppers, 1 cucumber (raw) with diet tea.   D ( 5:00 PM)- 2 chicken patties, 2-3 c of mixed vegetables with bottle of water  Snk ( 9:00 PM)- bag of Act Two butter popcorn, 1 bottle of water Went to be at 10:00 PM to watch TV, actually went to sleep 12:00.   Denies napping, takes sleeping pill intermittently.    Typical day? Yes.    Handouts given during visit include:  AVS  Demonstrated degree of understanding via:  Teach Back  Barriers to learning/adherence to lifestyle change: Dx with MR, though cognition is sufficient for understanding goals and strategies for achieving them.   Macaela A Terada is a 40 y.o. female with a history of PBC s/p liver transplant here for follow up nutritional counseling for worsening obesity.   Seen in nutrition clinic with Dr. Jenne Campus today with the following goals:  - Get more sleep, setting alarm for 7am - Eat a better breakfast: Cook breakfast with protein with eggs and/or meat - Continue vegetables with lunch and dinner and good water intake

## 2015-09-15 NOTE — Patient Instructions (Addendum)
Try to get more sleep: - Set your alarm for 7:00am.  Eat a more substantial breakfast:  - Cook scrambled eggs, bacon, toast, and even some fruit.   Continue vegetables with lunch and dinner and good water intake. - Any time you make canned soup to eat, first microwave some vegetables and add them to the canned soup to get more vegetables.

## 2015-10-11 ENCOUNTER — Other Ambulatory Visit: Payer: Self-pay | Admitting: Family Medicine

## 2015-10-11 ENCOUNTER — Other Ambulatory Visit (HOSPITAL_COMMUNITY)
Admission: RE | Admit: 2015-10-11 | Discharge: 2015-10-11 | Disposition: A | Discharge status: Home / Self Care | Payer: Medicaid Other | Source: Ambulatory Visit | Attending: Family Medicine | Admitting: Family Medicine

## 2015-10-11 ENCOUNTER — Encounter: Payer: Self-pay | Admitting: Family Medicine

## 2015-10-11 ENCOUNTER — Ambulatory Visit (INDEPENDENT_AMBULATORY_CARE_PROVIDER_SITE_OTHER): Payer: Medicaid Other | Admitting: Family Medicine

## 2015-10-11 VITALS — BP 136/76 | HR 97 | Temp 97.9°F | Ht 60.0 in | Wt 216.0 lb

## 2015-10-11 DIAGNOSIS — Z01419 Encounter for gynecological examination (general) (routine) without abnormal findings: Secondary | ICD-10-CM | POA: Diagnosis present

## 2015-10-11 DIAGNOSIS — Z124 Encounter for screening for malignant neoplasm of cervix: Secondary | ICD-10-CM | POA: Diagnosis not present

## 2015-10-11 DIAGNOSIS — R635 Abnormal weight gain: Secondary | ICD-10-CM | POA: Diagnosis not present

## 2015-10-11 DIAGNOSIS — Z0000 Encounter for general adult medical examination without abnormal findings: Secondary | ICD-10-CM

## 2015-10-11 DIAGNOSIS — Z1151 Encounter for screening for human papillomavirus (HPV): Secondary | ICD-10-CM | POA: Insufficient documentation

## 2015-10-11 DIAGNOSIS — J4530 Mild persistent asthma, uncomplicated: Secondary | ICD-10-CM

## 2015-10-11 NOTE — Progress Notes (Signed)
40 y.o. year old female presents for an annual GYN examination. She is s/p a liver transplant on chronic immunosuppresants therefore it was determined we should try perform a pap smear annually. At her last visit, she was on her period and wanted to wait. She's actually on her period today, LMP 10/09/15 however she states it is light.   Acute Concerns: Having asthma attacks 3x/day now that its hot. She notes she gets SOB and cough and has to use an albuterol inhaler approximately 3 times per day when it is hot. She's also on symbicort. No SOB or coughing now. No fevers. No wheezing during this time. Patient scheduled to f/u with pulmonology on 10/20/15.   Diet: Varied, still working with Dr. Jenne Campus with nutrition and not making much progress.   Exercise: walking around the house and sometimes up and down the block.   Sexual/Birth History: never sexually active.   Birth Control: N/A  Obesity: she's still having difficulty losing weight. She checks her CBGs 2x/day, they range from 80-140s on my review of her glucometer.   Social:  Social History   Social History  . Marital Status: Single    Spouse Name: N/A  . Number of Children: 0  . Years of Education: 12 th   Occupational History  .      Disabled   Social History Main Topics  . Smoking status: Never Smoker   . Smokeless tobacco: Never Used  . Alcohol Use: No  . Drug Use: No  . Sexual Activity: No   Other Topics Concern  . None   Social History Narrative   Lives alone but uncle and his wife live next door and are her primary caregivers. Sonia Side (uncle) can be reached at [phone].    Education High school education.   Disabled.   Right handed.   Caffeine tea and coffee daily.    Immunization: Immunization History  Administered Date(s) Administered  . H1N1 05/10/2008  . Influenza Split 02/09/2011, 02/13/2012  . Influenza Whole 02/20/2007, 02/16/2008, 03/04/2009, 03/03/2010  . Influenza,inj,Quad PF,36+ Mos 03/09/2013,  03/05/2014, 05/19/2015  . PPD Test 04/27/2013, 06/22/2013  . Pneumococcal Polysaccharide-23 03/01/2013  . Td 05/29/2003, 02/07/2010     Physical Exam: Blood pressure 136/76, pulse 97, temperature 97.9 F (36.6 C), temperature source Oral, height 5' (1.524 m), weight 216 lb (97.977 kg). GEN: Pleasant female, NAD CARDIAC:RRR, S1 and S2 present, no murmur, no heaves/thrills RESP: CTAB, normal effort GYN:  External genitalia within normal limits.  Vaginal mucosa pink, moist, normal rugae.  Nonfriable cervix without lesions, no discharge noted on speculum exam. Moderate amount of blood in vaginal vault.  Bimanual exam revealed normal, nongravid uterus.  No cervical motion tenderness. No adnexal masses bilaterally.    ASSESSMENT & PLAN: 40 y.o. female presents for annual GYN exam. Please see problem specific assessment and plan.   Asthma Patient well appearing on my exam discussed that she should not be requiring albuterol that often. Asked pt to keep a record of her symptoms and what she's doing when she uses the albuterol and take this to Dr. Ishmael Holter.   Encounter for cervical Pap smear with pelvic exam Pap smear performed today.

## 2015-10-11 NOTE — Assessment & Plan Note (Signed)
Patient well appearing on my exam discussed that she should not be requiring albuterol that often. Asked pt to keep a record of her symptoms and what she's doing when she uses the albuterol and take this to Dr. Ishmael Holter.

## 2015-10-11 NOTE — Assessment & Plan Note (Signed)
Pap smear performed today. 

## 2015-10-11 NOTE — Patient Instructions (Addendum)
Talk to your lung doctor about your breathing at the next visit, you should not be using the albuterol inhaler that often.   Things to do to Keep yourself Healthy - Exercise at least 30-45 minutes a day,  3-4 days a week.  - Eat a low-fat diet with lots of fruits and vegetables, up to 7-9 servings per day. - Seatbelts can save your life. Wear them always. - Smoke detectors on every level of your home, check batteries every year. - Eye Doctor - have an eye exam every 1-2 years - Violence - If anyone is threatening or hurting you, please call immediately.

## 2015-10-12 ENCOUNTER — Telehealth: Payer: Self-pay | Admitting: Family Medicine

## 2015-10-12 DIAGNOSIS — R7989 Other specified abnormal findings of blood chemistry: Secondary | ICD-10-CM

## 2015-10-12 LAB — TSH: TSH: 4.8 mIU/L — ABNORMAL HIGH

## 2015-10-12 LAB — CYTOLOGY - PAP

## 2015-10-12 NOTE — Telephone Encounter (Signed)
Called patient to let her know about slightly elevated TSH. Asked her to call in and make a lab appt for T3 and T4 to be checked. Patient voiced understanding. Labs placed.  [name], MD [facility] Family Medicine Resident  10/12/2015, 1:27 PM

## 2015-10-14 ENCOUNTER — Other Ambulatory Visit: Payer: Self-pay | Admitting: Family Medicine

## 2015-10-14 LAB — T3, FREE: T3, Free: 3.5 pg/mL (ref 2.3–4.2)

## 2015-10-14 LAB — T4, FREE: Free T4: 1.1 ng/dL (ref 0.8–1.8)

## 2015-10-14 NOTE — Telephone Encounter (Signed)
Patient states that she requested Dr. Lorenso Courier to send a RX for her test strips while she was being seen on 10/11/15. They were never sent in. Please send in asap.

## 2015-10-17 ENCOUNTER — Encounter: Payer: Self-pay | Admitting: Family Medicine

## 2015-10-17 MED ORDER — ACCU-CHEK AVIVA PLUS VI STRP: ORAL_STRIP | Status: DC

## 2015-10-17 NOTE — Addendum Note (Signed)
Addended by: [name] on: 10/17/2015 12:12 PM   Modules accepted: Miquel Dunn

## 2015-10-20 ENCOUNTER — Encounter: Payer: Self-pay | Admitting: Allergy and Immunology

## 2015-10-20 ENCOUNTER — Ambulatory Visit (INDEPENDENT_AMBULATORY_CARE_PROVIDER_SITE_OTHER): Payer: Medicaid Other | Admitting: Allergy and Immunology

## 2015-10-20 VITALS — BP 120/68 | HR 96 | Temp 97.5°F | Resp 20

## 2015-10-20 DIAGNOSIS — H1010 Acute atopic conjunctivitis, unspecified eye: Secondary | ICD-10-CM

## 2015-10-20 DIAGNOSIS — J4540 Moderate persistent asthma, uncomplicated: Secondary | ICD-10-CM

## 2015-10-20 DIAGNOSIS — J309 Allergic rhinitis, unspecified: Secondary | ICD-10-CM

## 2015-10-20 NOTE — Progress Notes (Signed)
FOLLOW UP NOTE  RE: [name] MRN: [id number] DOB: [birth year] [facility] [address] [place] [address] Date of Office Visit: 10/20/2015  Subjective:  [name] is a 40 y.o. female who presents today for Follow-up  Assessment:   1. Moderate persistent asthma, Normal lung exam and excellent in office spirometry.    2. Allergic rhinoconjunctivitis.   3.      Complex medical history on multiple medication regime. 4.      Recent increased weight, likely component of deconditioning. Plan:  No orders of the defined types were placed in this encounter.   Patient Instructions  1.  Continue current medication regime--Symbicort, Singulair, Zyrtec, and azelastine. 2.  Saline nasal wash each evening at Shower time. 3.  Continue working with Nutritionist and regular exercise. 4.  Consider addition of eyedrop if persisting symptoms. 5.  Follow-up in 4 months or sooner if needed and monitor closely any recurring symptoms as weather temperature increases.  HPI:   [name] returns to the office in follow-up of allergic rhinoconjunctivitis and asthma.  Since her last visit in November 2016, she overall has done well.  She has maintained on daily medications and reports being active, walking at home and chair exercises at the center.  With the warmer weather and outdoor time, she had noted itchy watery eyes and occasional cough and congestion.  She did call EMS last month for her breathing where albuterol was administered.  She had no persisting symptoms and did not make follow-up here or with primary MD Denies frequent or recurring albuterol use.  She denies chest tightness, headache, sore throat, fever or discolored drainage.  Denies ED or urgent care visits, prednisone or antibiotic courses. Reports sleep and activity are normal.  [name] has a current medication list which includes the following prescription(s): acetaminophen, ammonium lactate, azelastine  hcl,  benztropine, budesonide-formoterol, carboxymethylcellulose, cetirizine hcl, cholecalciferol, cyclosporine modified, cyclosporine modified, divalproex, doxepin, hypromellose, magnesium oxide, montelukast, multivitamin, mycophenolate, omeprazole, polyethylene glycol, prazosin, proair hfa, risperidone, valganciclovir hcl, and wheat dextrin.   Drug Allergies: Allergies  Allergen Reactions  . Tramadol Hcl Itching and Nausea And Vomiting    Causes SEIZURES  . Aspirin Nausea And Vomiting    Due to Stomach ulcer  . Ibuprofen Other (See Comments)    Due to stomach ulcer  . Penicillins Itching, Swelling and Rash    Breakout     . Pseudoephedrine Itching and Swelling    Tongue swelling  . Sulfa Antibiotics Nausea And Vomiting  . Latex Itching and Rash   Objective:   Filed Vitals:   10/20/15 1002  BP: 120/68  Pulse: 96  Temp: 97.5 F (36.4 C)  Resp: 20   SpO2 Readings from Last 1 Encounters:  10/20/15 98%   Physical Exam  Constitutional: She is well-developed, well-nourished, and in no distress.  HENT:  Head: Atraumatic.  Right Ear: Tympanic membrane and ear canal normal.  Left Ear: Tympanic membrane and ear canal normal.  Nose: Mucosal edema present. No rhinorrhea. No epistaxis.  Mouth/Throat: Oropharynx is clear and moist and mucous membranes are normal. No oropharyngeal exudate, posterior oropharyngeal edema or posterior oropharyngeal erythema.  Neck: Neck supple.  Cardiovascular: Normal rate, S1 normal and S2 normal.   No murmur heard. Pulmonary/Chest: Effort normal. She has no wheezes. She has no rhonchi. She has no rales.  Lymphadenopathy:    She has no cervical adenopathy.   Diagnostics: Spirometry:  FVC 2.34--96%, FEV1 2.16--103%.  ACT= incomplete due to developmental delay.    Adalea Handler M. Ishmael Holter, MD  cc: Kathrine Cords, MD

## 2015-10-20 NOTE — Patient Instructions (Signed)
   Continue current medication regime.  Saline nasal wash each evening at Shower time.  Continue working with Nutritionist.  Continue regular exercise.  Follow-up in 4 months or sooner if needed.

## 2015-10-21 ENCOUNTER — Other Ambulatory Visit: Payer: Self-pay

## 2015-11-03 ENCOUNTER — Other Ambulatory Visit: Payer: Self-pay

## 2015-11-03 ENCOUNTER — Ambulatory Visit (INDEPENDENT_AMBULATORY_CARE_PROVIDER_SITE_OTHER): Payer: Medicaid Other | Admitting: Family Medicine

## 2015-11-03 ENCOUNTER — Encounter: Payer: Self-pay | Admitting: Family Medicine

## 2015-11-03 VITALS — Ht 60.0 in | Wt 218.6 lb

## 2015-11-03 DIAGNOSIS — E669 Obesity, unspecified: Secondary | ICD-10-CM

## 2015-11-03 MED ORDER — CETIRIZINE HCL 10 MG PO CAPS: 10.0000 mg | ORAL_CAPSULE | Freq: Every day | ORAL | Status: DC

## 2015-11-03 NOTE — Progress Notes (Signed)
NUTRITION VISIT:  Learning Readiness:   Ready / Change in progress  Usual eating pattern on typical day includes 3 meals and 2 snacks per day. Frequent foods and beverages on most days include frozen chicken patty, broccoli and cheese, yogurt, bacon / frozen hamburger. Drinks [employer] tea, V8 Splash, Water, limited soda only special occasion.  Usual physical activity includes walking around the house x 2 hours 7-9 am, brisk, daily. Continues with exercise video x 2 weekly, duration 2 hour per.  24-hr recall (suggests intake of 2100-2200 kcal) (Up at  0900 AM) B ( 0905 AM)- Sausage, egg and cheese croissant (frozen), water.    Snk (1115 AM)- Rice cake (plain, salt free), V8 Mango/Berry splash (~20 oz)    L (1230 PM)- 2 hot dogs, shredded cheese, 2 buns, 1 whole tomato sliced, bottle Lipton Diet Green Tea (~16-20 oz) (NSA) Snk (1400 PM)- 1 pack peanuts, water D (1700 PM)- Meatloaf (homemade, similar to slice of bread), broccoli and cheese (frozen, half package green giant), chocolate pudding (NSA, half cup), water (bottle) and bottle Lipton Diet Green Tea Typical day? Yes.     Home Aid (1600 to 1745) - Helps prepare dinner and organize meds  Sleep Habits: Went to bed at 2100, took Doxepin (rx by [facility]), fell asleep right away, stayed asleep, without awakening Monday/Tuesday (unclear if always these 2 days, seems to be variable) - watches TV shows from 9pm to midnight, still takes medicine at 9pm  Still attending adult day care - M - Th - Fri (does get up earlier on these days 0500)  Demonstrated degree of understanding via:  Teach Back  Barriers to learning/adherence to lifestyle change: Diagnosis with intellectual disability, lives home alone, has Home Aid,   Seen in nutrition clinic with Dr. Jenne Campus today with the following goals:  - Get more sleep, setting alarm for 7am > met this goal, now naturally wakes up around 0900, no longer tired during the day - Eat a  better breakfast: Cook breakfast with protein with eggs and/or meat > eats sausage egg/cheese daily, sometimes will eat eggs (scrambeled for other meals) - Continue vegetables with lunch and dinner and good water intake > eats vegetables with every lunch and dinner (occasionally eats plain frozen mixed vegetables)  Goals set today 6/8 1. Keep track of what time you go to sleep and what time you get up 2. Eat more frozen mixed vegetables (no cheese, butter, oil) x 5 week minimum 3. Choose healthy snack from provided list in AVS  To see Nutritionist at [facility]  Monitoring/Evaluation: Follow-up Dietary intake, exercise, and body weight in 2 month(s).  [name], [place], PGY-3

## 2015-11-03 NOTE — Patient Instructions (Addendum)
Continue to work on progress of your goals. You are making some good choices, but there is always room for improvement. Keep exercising and walking as you are. Your home aid may help with the fresh foods and healthy snacks.  Follow-up with your nutritionist at [facility]  Please call Dr Jenne Campus [phone]) to schedule a Nutrition Clinic Visit in August 2017  Goals: 1. Keep track of what time you go to sleep and what time you get up 2. Eat more frozen mixed vegetables (no cheese, butter, oil) x 5 week minimum 3. Choose healthy snack from this list:  - Peanuts  - Rice cake  - Yogurt  - Sugar free pudding  - Fresh fruit (apples, oranges)   Try to avoid or limit these snacks to once a week or every other week  - Crackers + Peanut butter  - Little Bits package of muffins  - Sweets  - Any pre-processed snacks

## 2015-12-08 ENCOUNTER — Ambulatory Visit (INDEPENDENT_AMBULATORY_CARE_PROVIDER_SITE_OTHER): Payer: Medicaid Other | Admitting: Neurology

## 2015-12-08 ENCOUNTER — Encounter: Payer: Self-pay | Admitting: Neurology

## 2015-12-08 VITALS — BP 136/91 | HR 78 | Ht 60.0 in | Wt 211.8 lb

## 2015-12-08 DIAGNOSIS — G40909 Epilepsy, unspecified, not intractable, without status epilepticus: Secondary | ICD-10-CM | POA: Diagnosis not present

## 2015-12-08 DIAGNOSIS — F79 Unspecified intellectual disabilities: Secondary | ICD-10-CM

## 2015-12-08 DIAGNOSIS — K743 Primary biliary cirrhosis: Secondary | ICD-10-CM | POA: Diagnosis not present

## 2015-12-08 MED ORDER — DIVALPROEX SODIUM ER 500 MG PO TB24: 1000.0000 mg | ORAL_TABLET | Freq: Every day | ORAL | Status: DC

## 2015-12-08 NOTE — Progress Notes (Signed)
Chief Complaint  Patient presents with  . Seizures    No new concerns today.  Denies any seizure activity.      PATIENT: [name] DOB: 11/28/1975  REASON FOR VISIT: follow up-  seizures HISTORY FROM: patient  HISTORY OF PRESENT ILLNESS: HISTORY 03/23/14 [name]): [name], 40 year old black female returns today for followup of epilepsy. her uncle is [name], He is the power of attorney for [name]. She has a history of mild mental retardation, and long-standing history of seizures. She was well-controlled on Depakote and Tegretol extended release. But she has developed liver disease, was diagnosed with primary biliary cirrhosis,Antieptical medication was changed since February 2013, she is now taking Vimpat 100 mg 2 tabs twice a day, Keppra 500 mg 3 tabs daily. She has no significant seizure activities while taking Vimpat, and Keppra.She lives alone, uncles lives next door, she attends adult daycare regularly.She underwent liver transplantation in February 2015, doing very well, on polypharmacy treatment, this including CellCept 1000 mg twice a day, Prograft 4 mg twice a day,For her seizure, she is now taking deapkote ER 250mg  5 tabs qhs, she is having seizure about once amonth, it usually happens at nighttime, she was noted to have nighttime extremity jumping movement, tongue biting when she woke up, she also complains of mild gait difficullty,   UPDATE Oct 27th 2015:I have talked with [name] liver transplant cordinator, reported, 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, at [name], Keppra, Vimpat cause worsening behavior issue, she had a hallucinations, paranoid to the point of almost institutionalized, her mood disorder eventually was under control with Depakote ER 250 mg 5 tablets each day, [name] continues to complains that she had a 2 episode of waking up with blood in her mouth, has bitten her tongue, one in July, the second one was September 2015, overall she is tolerating the  medication well, Depakote level was 63 in February 23 2014, EEG was normal.Repeat laboratory evaluation in March 09 2014, showed decreased white count 1.6, she is receiving weekly laboratory evaluation by [name] transplant team, I have called coordinator again, is aware of her leukopenia, she is on lower dose of immunosuppressive treatment, now taking cyclosporine 200 mg twice a day, CellCept 250 mg twice a day,She has personal aid every evening  UPDATE December 08 2015: She is brought in by [name] today, I reviewed and summarized her to visit in January 2017, "Patient's current immunosuppression regimen is Cyclosporine 100/125mg  and Mycophenolate 500MG  BID". She has no clinical features concerning for complications of immunosuppressive drug therapy,  She is now taking Depakote DR 250mg  4 tabs qhs, she reported one episode of left hand shaking in July 2017.    Laboratory evaluation in January 2017, Depakote level 78, CBC showed mild anemia, hemoglobin 9.6, normal CMP,  REVIEW OF SYSTEMS: Out of a complete 14 system review of symptoms, the patient complains only of the following symptoms, and all other reviewed systems are negative.   runny nose, cough, wheezing, chest tightness  ALLERGIES: Allergies  Allergen Reactions  . Tramadol Hcl Itching and Nausea And Vomiting    Causes SEIZURES  . Aspirin Nausea And Vomiting    Due to Stomach ulcer  . Ibuprofen Other (See Comments)    Due to stomach ulcer  . Penicillins Itching, Swelling and Rash    Breakout     . Pseudoephedrine Itching and Swelling    Tongue swelling  . Sulfa Antibiotics Nausea And Vomiting  . Latex Itching and Rash  HOME MEDICATIONS: Outpatient Prescriptions Prior to Visit  Medication Sig Dispense Refill  . ACCU-CHEK AVIVA PLUS test strip Use to check blood sugar daily 100 each 1  . ACCU-CHEK SOFTCLIX LANCETS lancets Use to check blood sugar once daily in the morning prior to eating. 100 each 2  .  acetaminophen (TYLENOL) 325 MG tablet Take 650 mg by mouth every 4 (four) hours as needed for mild pain or headache.     Marland Kitchen ammonium lactate (LAC-HYDRIN) 12 % cream Apply topically 2 (two) times daily.     . Azelastine HCl (ASTEPRO) 0.15 % SOLN Place 1 spray into the nose 2 (two) times daily as needed (allergic rhinitis). 30 mL 1  . Azelastine HCl 0.15 % SOLN Place 1 spray into both nostrils 2 (two) times daily as needed. 30 mL 5  . benztropine (COGENTIN) 0.5 MG tablet Take 0.5 mg by mouth at bedtime.    . budesonide-formoterol (SYMBICORT) 160-4.5 MCG/ACT inhaler Inhale 2 puffs into the lungs 2 (two) times daily.  1 Inhaler   . carboxymethylcellulose (REFRESH TEARS) 0.5 % SOLN Place 1 drop into both eyes 4 (four) times daily as needed (for dry eyes).    . Cetirizine HCl 10 MG CAPS Take 1 capsule (10 mg total) by mouth daily. 30 capsule 5  . cholecalciferol (VITAMIN D) 1000 UNITS tablet Take 2,000 Units by mouth daily.    . cycloSPORINE modified (NEORAL) 25 MG capsule Use in combination with 100 mg capsules for PM dose of 125 mg.  1  . cycloSPORINE modified 100 MG CAPS Take 100-125 mg by mouth 2 (two) times daily. Take 100 mg in the AM and 125 mg in the PM    . divalproex (DEPAKOTE) 250 MG DR tablet Take 4 tablets (1,000 mg total) by mouth at bedtime. 120 tablet 6  . doxepin (SINEQUAN) 10 MG capsule Take 10 mg by mouth at bedtime.    . Hypromellose (ARTIFICIAL TEARS OP) Apply 1 drop to eye as needed.    . magnesium oxide (MAG-OX) 400 MG tablet Take 400 mg by mouth 2 (two) times daily. 5pm & 7pm.    . montelukast (SINGULAIR) 10 MG tablet Take 1 tablet (10 mg total) by mouth at bedtime. 34 tablet 5  . Multiple Vitamin (MULTIVITAMIN) tablet Take 1 tablet by mouth daily. 9 am.    . mycophenolate (CELLCEPT) 250 MG capsule Take 500 mg by mouth 2 (two) times daily. 9 am & 9 pm.    . omeprazole (PRILOSEC) 40 MG capsule Take 40 mg by mouth daily. 9 am.    . polyethylene glycol (MIRALAX / GLYCOLAX) packet  Take 17 g by mouth daily as needed (for constipation). In water or juice    . prazosin (MINIPRESS) 1 MG capsule Take 1 mg by mouth at bedtime.    Marland Kitchen PROAIR HFA 108 (90 BASE) MCG/ACT inhaler Inhale 1 puff into the lungs every 4 (four) hours as needed for shortness of breath.   0  . risperiDONE (RISPERDAL) 3 MG tablet Take 3 mg by mouth at bedtime.    . ValGANciclovir HCl (VALCYTE PO) Take 900 mg by mouth daily.    . Wheat Dextrin (BENEFIBER DRINK MIX PO) Take 1 scoop by mouth 3 (three) times a week. Mon, Wed, Fri     No facility-administered medications prior to visit.    PAST MEDICAL HISTORY: Past Medical History  Diagnosis Date  . Allergy   . Anemia   . Intellectual disability   . Constipation   .  Abnormal vaginal bleeding   . Biliary cirrhosis ([place]) 04/26/11  . 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 DUB (dysfunctional uterine bleeding)   . PONV (postoperative nausea and vomiting)   . Seizures ([place])     congenital epilepsy-hx. seizures  . Asthma 09-23-12    Asthma somewhat causing increase wheezing and mild congestion at present  . GERD (gastroesophageal reflux disease)   . Arthritis     right knee  . Vitamin D deficiency     PAST SURGICAL HISTORY: Past Surgical History  Procedure Laterality Date  . Appendectomy    . Cholecystectomy    . Eye muscle surgery    . Eustacean tubes    . Colonoscopy w/ biopsies    . Adenonoidectomy    . Esophagogastroduodenoscopy (egd) with propofol N/A 10/01/2012    Procedure: ESOPHAGOGASTRODUODENOSCOPY (EGD) WITH PROPOFOL;  Surgeon: Arta Silence, MD;  Location: [facility];  Service: Endoscopy;  Laterality: N/A;  . Liver transplant      FAMILY HISTORY: Family History  Problem Relation Age of Onset  . Diabetes Mother   . Heart disease Mother   . COPD Mother     SOCIAL HISTORY: Social History   Social History  . Marital Status: Single    Spouse Name: N/A  . Number of Children: 0  . Years of Education: 12 th    Occupational History  .      Disabled   Social History Main Topics  . Smoking status: Never Smoker   . Smokeless tobacco: Never Used  . Alcohol Use: No  . Drug Use: No  . Sexual Activity: No   Other Topics Concern  . Not on file   Social History Narrative   Lives alone but uncle and his wife live next door and are her primary caregivers. Sonia Side (uncle) can be reached at [phone].    Education High school education.   Disabled.   Right handed.   Caffeine tea and coffee daily.      PHYSICAL EXAM  Filed Vitals:   12/08/15 1025  BP: 136/91  Pulse: 78  Height: 5' (1.524 m)  Weight: 211 lb 12 oz (96.049 kg)   Body mass index is 41.35 kg/(m^2).  Generalized: Well developed, in no acute distress   Neurological examination  Mentation: Alert oriented to time, place, history taking. Follows all commands speech and language fluent Cranial nerve II-XII: Pupils were equal round reactive to light. Extraocular movements were full, visual field were full on confrontational test. Facial sensation and strength were normal. Uvula tongue midline. Head turning and shoulder shrug  were normal and symmetric. Motor: The motor testing reveals 5 over 5 strength of all 4 extremities. Good symmetric motor tone is noted throughout.  Sensory: Sensory testing is intact to soft touch on all 4 extremities. No evidence of extinction is noted.  Coordination: Cerebellar testing reveals good finger-nose-finger and heel-to-shin bilaterally.  Gait and station: Gait is normal. Tandem gait is normal. Romberg is negative. No drift is seen.  Reflexes: Deep tendon reflexes are symmetric and normal bilaterally.   DIAGNOSTIC DATA (LABS, IMAGING, TESTING) - I reviewed patient records, labs, notes, testing and imaging myself where available.  Lab Results  Component Value Date   WBC 3.7 06/09/2015   HGB 9.8* 06/08/2014   HCT 29.7* 06/09/2015   MCV 85 06/09/2015   PLT 153 06/09/2015      Component Value  Date/Time   NA 140 06/09/2015 0957   NA 138 06/28/2014 0932   K  4.9 06/09/2015 0957   CL 104 06/09/2015 0957   CO2 22 06/09/2015 0957   GLUCOSE 103* 06/09/2015 0957   GLUCOSE 104* 06/28/2014 0932   BUN 23 06/09/2015 0957   BUN 24* 06/28/2014 0932   CREATININE 0.79 06/09/2015 0957   CREATININE 0.83 06/28/2014 0932   CALCIUM 8.9 06/09/2015 0957   CALCIUM 9.0 09/11/2007 2301   PROT 6.9 06/09/2015 0957   PROT 6.7 06/28/2014 0932   ALBUMIN 3.9 06/09/2015 0957   ALBUMIN 3.7 06/28/2014 0932   AST 11 06/09/2015 0957   ALT 7 06/09/2015 0957   ALKPHOS 63 06/09/2015 0957   BILITOT 0.3 06/09/2015 0957   BILITOT 0.5 06/28/2014 0932   GFRNONAA 93 06/09/2015 0957   GFRNONAA >89 11/09/2011 1208   GFRAA 108 06/09/2015 0957   GFRAA >89 11/09/2011 1208   Lab Results  Component Value Date   CHOL 211* 04/28/2015   HDL 35* 04/28/2015   LDLCALC 129 04/28/2015   LDLDIRECT 64 07/10/2011   TRIG 233* 04/28/2015   CHOLHDL 6.0* 04/28/2015   Lab Results  Component Value Date   HGBA1C 5.1 01/24/2015   Lab Results  Component Value Date   VITAMINB12 616 08/13/2007   Lab Results  Component Value Date   TSH 4.80* 10/11/2015   ASSESSMENT AND PLAN 40 y.o. year old female   Seizures  Doing well on Depakote DR 250 mg 4 tablets every night  I have changed to equivalent dosage of Depakote ER 500 mg 2 tablets every night  Status post liver transplant in 2015, history of primary biliary cirrhosis is on Cyclosporine 100/125mg  and Mycophenolate 500MG  BID   Marcial Pacas, M.D. Ph.D.  [facility] [place], [place] [address] Phone: [phone] Fax:      [phone]

## 2015-12-13 ENCOUNTER — Other Ambulatory Visit: Payer: Self-pay | Admitting: *Deleted

## 2015-12-13 ENCOUNTER — Telehealth: Payer: Self-pay | Admitting: Allergy and Immunology

## 2015-12-13 ENCOUNTER — Other Ambulatory Visit: Payer: Self-pay

## 2015-12-13 MED ORDER — AZELASTINE HCL 0.1 % NA SOLN: 2.0000 | Freq: Two times a day (BID) | NASAL | Status: DC

## 2015-12-13 MED ORDER — PROAIR HFA 108 (90 BASE) MCG/ACT IN AERS: 1.0000 | INHALATION_SPRAY | RESPIRATORY_TRACT | Status: DC | PRN

## 2015-12-13 MED ORDER — AZELASTINE HCL 0.15 % NA SOLN: 1.0000 | Freq: Two times a day (BID) | NASAL | Status: DC | PRN

## 2015-12-13 MED ORDER — MONTELUKAST SODIUM 10 MG PO TABS: 10.0000 mg | ORAL_TABLET | Freq: Every day | ORAL | Status: DC

## 2015-12-13 MED ORDER — BUDESONIDE-FORMOTEROL FUMARATE 160-4.5 MCG/ACT IN AERO: 2.0000 | INHALATION_SPRAY | Freq: Two times a day (BID) | RESPIRATORY_TRACT | Status: DC

## 2015-12-13 NOTE — Telephone Encounter (Signed)
Sent all refills to pharmacy.

## 2015-12-13 NOTE — Telephone Encounter (Signed)
The pharmacy has sent a request for refills on her Symbicort and Proair as her prescriptions have expired. She scheduled an appointment with Dr. Neldon Mc on August 16. Can we get refills on these for her please? Thanks  [employer] on [address]

## 2015-12-13 NOTE — Telephone Encounter (Signed)
Sent refills in for all her medications.

## 2015-12-22 ENCOUNTER — Ambulatory Visit: Payer: Self-pay | Admitting: Family Medicine

## 2016-01-05 ENCOUNTER — Ambulatory Visit (INDEPENDENT_AMBULATORY_CARE_PROVIDER_SITE_OTHER): Payer: Medicaid Other | Admitting: Family Medicine

## 2016-01-05 ENCOUNTER — Encounter: Payer: Self-pay | Admitting: Internal Medicine

## 2016-01-05 ENCOUNTER — Encounter: Payer: Self-pay | Admitting: Family Medicine

## 2016-01-05 ENCOUNTER — Ambulatory Visit (INDEPENDENT_AMBULATORY_CARE_PROVIDER_SITE_OTHER): Payer: Medicaid Other | Admitting: Internal Medicine

## 2016-01-05 VITALS — BP 125/85 | HR 91 | Temp 97.6°F | Wt 219.6 lb

## 2016-01-05 VITALS — Ht 60.0 in | Wt 219.6 lb

## 2016-01-05 DIAGNOSIS — L259 Unspecified contact dermatitis, unspecified cause: Secondary | ICD-10-CM | POA: Diagnosis not present

## 2016-01-05 DIAGNOSIS — E669 Obesity, unspecified: Secondary | ICD-10-CM

## 2016-01-05 MED ORDER — HYDROCORTISONE 0.5 % EX CREA: 1.0000 "application " | TOPICAL_CREAM | Freq: Two times a day (BID) | CUTANEOUS | 0 refills | Status: DC

## 2016-01-05 NOTE — Patient Instructions (Signed)
Start the hydrocortisone cream twice daily until the rash and itching resolves.   Contact Dermatitis Dermatitis is redness, soreness, and swelling (inflammation) of the skin. Contact dermatitis is a reaction to certain substances that touch the skin. There are two types of contact dermatitis:   Irritant contact dermatitis. This type is caused by something that irritates your skin, such as dry hands from washing them too much. This type does not require previous exposure to the substance for a reaction to occur. This type is more common.  Allergic contact dermatitis. This type is caused by a substance that you are allergic to, such as a nickel allergy or poison ivy. This type only  occurs if you have been exposed to the substance (allergen) before. Upon a repeat exposure, your body reacts to the substance. This type is less common. CAUSES  Many different substances can cause contact dermatitis. Irritant contact dermatitis is most commonly caused by exposure to:   Makeup.   Soaps.   Detergents.   Bleaches.   Acids.   Metal salts, such as nickel.  Allergic contact dermatitis is most commonly caused by exposure to:   Poisonous plants.   Chemicals.   Jewelry.   Latex.   Medicines.   Preservatives in products, such as clothing.  RISK FACTORS This condition is more likely to develop in:   People who have jobs that expose them to irritants or allergens.  People who have certain medical conditions, such as asthma or eczema.  SYMPTOMS  Symptoms of this condition may occur anywhere on your body where the irritant has touched you or is touched by you. Symptoms include:  Dryness or flaking.   Redness.   Cracks.   Itching.   Pain or a burning feeling.   Blisters.  Drainage of small amounts of blood or clear fluid from skin cracks. With allergic contact dermatitis, there may also be swelling in areas such as the eyelids, mouth, or genitals.  DIAGNOSIS    This condition is diagnosed with a medical history and physical exam. A patch skin test may be performed to help determine the cause. If the condition is related to your job, you may need to see an occupational medicine specialist. TREATMENT Treatment for this condition includes figuring out what caused the reaction and protecting your skin from further contact. Treatment may also include:   Steroid creams or ointments. Oral steroid medicines may be needed in more severe cases.  Antibiotics or antibacterial ointments, if a skin infection is present.  Antihistamine lotion or an antihistamine taken by mouth to ease itching.  A bandage (dressing). HOME CARE INSTRUCTIONS Skin Care  Moisturize your skin as needed.   Apply cool compresses to the affected areas.  Try taking a bath with:  Epsom salts. Follow the instructions on the packaging. You can get these at your local pharmacy or grocery store.  Baking soda. Pour a small amount into the bath as directed by your health care provider.  Colloidal oatmeal. Follow the instructions on the packaging. You can get this at your local pharmacy or grocery store.  Try applying baking soda paste to your skin. Stir water into baking soda until it reaches a paste-like consistency.  Do not scratch your skin.  Bathe less frequently, such as every other day.  Bathe in lukewarm water. Avoid using hot water. Medicines  Take or apply over-the-counter and prescription medicines only as told by your health care provider.   If you were prescribed an antibiotic medicine, take  or apply your antibiotic as told by your health care provider. Do not stop using the antibiotic even if your condition starts to improve. General Instructions  Keep all follow-up visits as told by your health care provider. This is important.  Avoid the substance that caused your reaction. If you do not know what caused it, keep a journal to try to track what caused it.  Write down:  What you eat.  What cosmetic products you use.  What you drink.  What you wear in the affected area. This includes jewelry.  If you were given a dressing, take care of it as told by your health care provider. This includes when to change and remove it. SEEK MEDICAL CARE IF:   Your condition does not improve with treatment.  Your condition gets worse.  You have signs of infection such as swelling, tenderness, redness, soreness, or warmth in the affected area.  You have a fever.  You have new symptoms. SEEK IMMEDIATE MEDICAL CARE IF:   You have a severe headache, neck pain, or neck stiffness.  You vomit.  You feel very sleepy.  You notice red streaks coming from the affected area.  Your bone or joint underneath the affected area becomes painful after the skin has healed.  The affected area turns darker.  You have difficulty breathing.   This information is not intended to replace advice given to you by your health care provider. Make sure you discuss any questions you have with your health care provider.   Document Released: 05/11/2000 Document Revised: 02/02/2015 Document Reviewed: 09/29/2014 Elsevier Interactive Patient Education [employer].

## 2016-01-05 NOTE — Patient Instructions (Addendum)
Please use your goal chart to monitor your goals   Goals for the visit   1. Eat 1-3 snacks a day that come from list provided by [name].  2. Obtain twice as many veg's as protein or carbohydrate foods for both lunch and dinner. 3. Exercise 60 minutes every day including on Monday, Thursday, and Friday doing Sweat to the [place] at 3 PM  Next Nutrition Visit  October 12th, 2017 at 9 AM - 60 minute visit

## 2016-01-05 NOTE — Progress Notes (Signed)
    Subjective: CC: rash on feet HPI: Patient is a 40 y.o. female with a past medical history of a PBC s/p liver transplant, cognitive delay, seizures presenting to clinic today for concerns for a rash on her feet and legs.  On Saturday she noted a bump on her neck, back and feet. Her feet are pruritic. It spread to her legs which are now pruritic. She notes her neck and back are not pruritic. The rash just feels "bumpy." No drainage or change in its appearance.   Medications tried: nothing  Similar rash in past: no New medications or antibiotics: no Tick, Insect or new pet exposure: no Recent travel: no  New detergent or soap: no Immunocompromised: yes   Symptoms Pain over rash: no Feeling ill all over: no Fever: no Mouth sores: no Face or tongue swelling: no Trouble breathing: no Joint swelling or pain: no  Health maintenance- pt to f/u with me for a pap smear.  Social History: never smoker  ROS: All other systems reviewed and are negative besides that noted in HPI.  Past Medical History Patient Active Problem List   Diagnosis Date Noted  . Sinusitis, acute 06/16/2015  . Impaired fasting blood sugar 01/24/2015  . Abnormality of gait 10/15/2014  . Amenorrhea 06/28/2014  . Encounter for cervical Pap smear with pelvic exam 05/12/2014  . Lumbar back pain 04/06/2014  . Health care maintenance 03/06/2014  . H/O liver transplant ([place]) 11/23/2013  . Aftercare following organ transplant 07/28/2013  . 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 Coagulation disorder ([place]) 07/12/2013  . Thrombocytopenia ([place]) 03/09/2013  . Seizure disorder ([place]) 03/01/2013  . Schizophrenia, paranoid type ([place]) 06/25/2012    Class: Chronic  . 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 Vision loss, bilateral 03/30/2012  . Primary biliary cirrhosis ([place]) 05/07/2011  . Unspecified vitamin D deficiency 09/13/2010  . Rectal bleeding 06/14/2010  . ANEMIA 03/03/2010  . GERD 07/21/2008  . 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 CARDIAC FLOW MURMUR 10/16/2007  . Convulsions/seizures ([place]) 08/23/2006  . Asthma with allergic rhinitis 07/30/2006  . Obesity 07/25/2006  . 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 Mental retardation 07/25/2006  . Asthma 07/25/2006  . OSTEOARTHRITIS, LOWER LEG 07/25/2006    Medications- reviewed and updated  Objective: Office vital signs reviewed. BP 125/85 (BP Location: Right Arm, Patient Position: Sitting, Cuff Size: Large)   Pulse 91   Temp 97.6 F (36.4 C) (Oral)   Wt 219 lb 9.6 oz (99.6 kg)   LMP 11/07/2015 (Within Days)   BMI 42.89 kg/m    Physical Examination:  General: Awake, alert, well- nourished, NAD Skin: no bumps or rash noted on neck or back. Faint maculopapular rash, slightly erythematous, over the feet and legs, most prominent on the right calf. No ticks noted. The entire area appears uniform without one prominent papule or area of induration or hyperpigmentation. No warmth. No drainage.    Assessment/Plan: Contact dermatitis: The patient's exam is consistent with a contact dermatitis on her legs. No evidence superimposed infection. No evidence of insect/tick bite causing this. Pt with a h/o liver transplant, however LFTs normal on 12/21/15 in [place]. Will prescribe hydrocortisone cream BID. If no improvement, worsening rash, or new symptoms, the patient was advised to f/u in our clinic.  [name] PGY-3, [place]

## 2016-01-05 NOTE — Progress Notes (Signed)
Medical Nutrition Therapy:  Appt start time: 0900 end time:  0950.  Assessment:  Primary concerns today: Weight management.   Learning Readiness:   Ready/Change in progress  Usual eating pattern includes 3 meals and 1-3  snacks per day.  Snacks currently include 100 gram goldfish, bananaes, watermelon, kiwi, oranges, baby carrots, celery. Frequent foods:  Patient is having  tuna for dinner tonight. Patient is using [name] Menu for her shopping list. This is new for her so she could not give me exacts about the menu. Her uncle buys her food 2-3 times a month. Beverages include Diet Green Tea, Water, Milk. Avoided foods include snacks with Crackers + Peanut butter, Little Bits package of muffins, Sweets,  Any pre-processed snacks  Usual physical activity includes 3-4 times a week.  "Sweat to the oldies" with Charlean Sanfilippo  from 12:30 -1:45 PM. On Monday, Thursday, Friday - goes to adult daycare and does not do exercise.   24-hr recall: (Up at  AM) B ( AM)-  Oatmeal- Apple and Maple in a 2 Packets/ Diet Tea Snk ( AM)-   Goldfish 40  L ( PM)-  Chef Salad (green peppers) with [employer] (not sure how much)/Water   Snk ( PM)-  None   D ( PM)-  Chicken Breast and  Zucchini, green peppers,and mushroom with olive oil; 17 grapes/Diet Tea  Snk ( PM)-  Honey gram crackes -3 gram crackers a day  Typical day? No. Just started this meal schedule at the beginning of August. Eating more fresh food. Not eating as much frozen foods. Patient has decreased her snacks from 4 to 1-3 snacks.   Home Aid (1600 to 1745) - Helps prepare dinner and organize meds   Adult Daycare  - Eats lunch at the adult daycare 3 times a week - Last Lunch: Barbeque Sandwich, Potatoes wedges, Coleslaw; Watermelon - 8oz of Milk and Water   Sleep Habits  - Improved  - Sleeping from 10/11 pm - 7/8 AM - Previous goal of getting up at 7 AM instead of earlier in the morning has been successful meet.   [name]  is a 40 y.o. female with a history of PBC s/p liver transplant here for follow up nutritional counseling for worsening obesity.   Progress Towards Goal(s):  Some progress.    Intervention:  Nutritional and Exercise Goals  1. Eat 1-3 snacks a day that come from list provided by [name].  2. Obtain twice as many veg's as protein or carbohydrate foods for both lunch and dinner. 3. Exercise 60 minutes every day including on Monday, Thursday, and Friday doing "Sweat to the [place]" at 3 PM   Handouts given during visit include:  AVS   Demonstrated degree of understanding via:  Teach Back  Barriers to learning/adherence to lifestyle change: Dx with MR, though cognition is sufficient for understanding goals and strategies for achieving them

## 2016-01-11 ENCOUNTER — Ambulatory Visit (INDEPENDENT_AMBULATORY_CARE_PROVIDER_SITE_OTHER): Payer: Medicaid Other | Admitting: Allergy and Immunology

## 2016-01-11 VITALS — BP 112/76 | HR 80 | Resp 18

## 2016-01-11 DIAGNOSIS — L989 Disorder of the skin and subcutaneous tissue, unspecified: Secondary | ICD-10-CM

## 2016-01-11 DIAGNOSIS — J4540 Moderate persistent asthma, uncomplicated: Secondary | ICD-10-CM

## 2016-01-11 DIAGNOSIS — H1010 Acute atopic conjunctivitis, unspecified eye: Secondary | ICD-10-CM | POA: Diagnosis not present

## 2016-01-11 DIAGNOSIS — D899 Disorder involving the immune mechanism, unspecified: Secondary | ICD-10-CM

## 2016-01-11 DIAGNOSIS — L308 Other specified dermatitis: Secondary | ICD-10-CM | POA: Diagnosis not present

## 2016-01-11 DIAGNOSIS — J309 Allergic rhinitis, unspecified: Secondary | ICD-10-CM | POA: Diagnosis not present

## 2016-01-11 DIAGNOSIS — D849 Immunodeficiency, unspecified: Secondary | ICD-10-CM

## 2016-01-11 MED ORDER — MOMETASONE FUROATE 0.1 % EX CREA: 1.0000 "application " | TOPICAL_CREAM | Freq: Two times a day (BID) | CUTANEOUS | 5 refills | Status: DC

## 2016-01-11 NOTE — Progress Notes (Signed)
Follow-up Note  Referring Provider: [name], MD Primary Provider: Kathrine Cords, MD Date of Office Visit: 01/11/2016  Subjective:   [name] (DOB: 12-19-1975) is a 40 y.o. female who returns to the [facility] on 01/11/2016 in re-evaluation of the following:  HPI: [name] returns to this clinic in reevaluation of her allergic rhinoconjunctivitis, moderate persistent asthma, and new onset dermatitis affecting her lower extremities. She has not been seen in his clinic since May 2017.  Regarding her airway issue, she believes that she is doing relatively well. On hot days she will need to use a short acting bronchodilator sometimes up to one time per day but if the weather is cool she does very well and does not need to use a short acting bronchodilator. She's been consistently using her Symbicort twice a day. Overall she thinks that she has pretty good control of this condition. She has not required a systemic steroid to treat an exacerbation since she's been last seen in this clinic.  Her nose has been doing relatively well. She uses nasal Azelastine on a regular basis twice a day and she thinks that this results in very good control of her nasal congestion and sneezing.  Jordyn developed a dermatitis on her lower extremities. This started last Saturday so about 10 days ago. It started off as a very intensely red and itchy dermatitis. She has had exposure to a cat during this timeframe that has been rubbing up against her legs. She went to the urgent care center was treated with hydrocortisone earlier this week which really did not make in a significant improvement regarding this dermatitis. She has no associated systemic or constitutional symptoms.  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     Medication List      ACCU-CHEK AVIVA PLUS test strip Generic  drug:  glucose blood Use to check blood sugar daily   ACCU-CHEK SOFTCLIX LANCETS lancets Use to check blood sugar once daily in the morning prior to eating.   acetaminophen 325 MG tablet Commonly known as:  TYLENOL Take 650 mg by mouth every 4 (four) hours as needed for mild pain or headache.   ARTIFICIAL TEARS OP Apply 1 drop to eye as needed.   azelastine 0.1 % nasal spray Commonly known as:  ASTELIN Place 2 sprays into both nostrils 2 (two) times daily. Use in each nostril as directed   BENEFIBER DRINK MIX PO Take 1 scoop by mouth 3 (three) times a week. Mon, Wed, Fri   benztropine 0.5 MG tablet Commonly known as:  COGENTIN Take 0.5 mg by mouth at bedtime.   budesonide-formoterol 160-4.5 MCG/ACT inhaler Commonly known as:  SYMBICORT Inhale 2 puffs into the lungs 2 (two) times daily.   Cetirizine HCl 10 MG Caps Take 1 capsule (10 mg total) by mouth daily.   cholecalciferol 1000 units tablet Commonly known as:  VITAMIN D Take 2,000 Units by mouth daily.   cycloSPORINE modified 100 MG Caps Take 100-125 mg by mouth 2 (two) times daily. Take 100 mg in the AM and 125 mg in the PM   cycloSPORINE modified 25 MG capsule Commonly known as:  NEORAL Use in combination with 100 mg capsules for PM dose of 125 mg.   divalproex 500 MG 24 hr tablet Commonly known as:  DEPAKOTE ER Take 2 tablets (1,000 mg total) by mouth at bedtime.  doxepin 10 MG capsule Commonly known as:  SINEQUAN Take 10 mg by mouth at bedtime.   hydrocortisone cream 0.5 % Apply 1 application topically 2 (two) times daily.   LAC-HYDRIN 12 % cream Generic drug:  ammonium lactate Apply topically 2 (two) times daily.   magnesium oxide 400 MG tablet Commonly known as:  MAG-OX Take 400 mg by mouth 2 (two) times daily. 5pm & 7pm.   mometasone 0.1 % cream Commonly known as:  ELOCON Apply 1 application topically 2 (two) times daily.   montelukast 10 MG tablet Commonly known as:  SINGULAIR Take 1  tablet (10 mg total) by mouth at bedtime.   multivitamin tablet Take 1 tablet by mouth daily. 9 am.   mycophenolate 250 MG capsule Commonly known as:  CELLCEPT Take 500 mg by mouth 2 (two) times daily. 9 am & 9 pm.   omeprazole 40 MG capsule Commonly known as:  PRILOSEC Take 40 mg by mouth daily. 9 am.   polyethylene glycol packet Commonly known as:  MIRALAX / GLYCOLAX Take 17 g by mouth daily as needed (for constipation). In water or juice   prazosin 1 MG capsule Commonly known as:  MINIPRESS Take 1 mg by mouth at bedtime.   PROAIR HFA 108 (90 Base) MCG/ACT inhaler Generic drug:  albuterol Inhale 1 puff into the lungs every 4 (four) hours as needed for shortness of breath.   REFRESH TEARS 0.5 % Soln Generic drug:  carboxymethylcellulose Place 1 drop into both eyes 4 (four) times daily as needed (for dry eyes).   risperiDONE 3 MG tablet Commonly known as:  RISPERDAL Take 3 mg by mouth at bedtime.   VALCYTE PO Take 900 mg by mouth daily.       Past Medical History:  Diagnosis Date  . Abnormal vaginal bleeding   . Allergy   . Anemia   . Arthritis    right knee  . Asthma 09-23-12   Asthma somewhat causing increase wheezing and mild congestion at present  . Biliary cirrhosis ([place]) 04/26/11  . Constipation   . DUB (dysfunctional uterine bleeding)   . GERD (gastroesophageal reflux disease)   . Intellectual disability   . PONV (postoperative nausea and vomiting)   . Primary biliary cirrhosis ([place]) 05/07/2011  . Seizures ([place])    congenital epilepsy-hx. seizures  . Vitamin D deficiency     Past Surgical History:  Procedure Laterality Date  . adenonoidectomy    . APPENDECTOMY    . CHOLECYSTECTOMY    . COLONOSCOPY W/ BIOPSIES    . ESOPHAGOGASTRODUODENOSCOPY (EGD) WITH PROPOFOL N/A 10/01/2012   Procedure: ESOPHAGOGASTRODUODENOSCOPY (EGD) WITH PROPOFOL;  Surgeon: Arta Silence, MD;  Location: [facility];  Service: Endoscopy;  Laterality: N/A;  . eustacean tubes     . EYE MUSCLE SURGERY    . LIVER TRANSPLANT      Allergies  Allergen Reactions  . Tramadol Hcl Itching and Nausea And Vomiting    Causes SEIZURES  . Aspirin Nausea And Vomiting    Due to Stomach ulcer  . Ibuprofen Other (See Comments)    Due to stomach ulcer  . Penicillins Itching, Swelling and Rash    Breakout     . Pseudoephedrine Itching and Swelling    Tongue swelling  . Sulfa Antibiotics Nausea And Vomiting  . Latex Itching and Rash    Review of systems negative except as noted in HPI / PMHx or noted below:  Review of Systems  Constitutional: Negative.   HENT: Negative.  Eyes: Negative.   Respiratory: Negative.   Cardiovascular: Negative.   Gastrointestinal: Negative.   Genitourinary: Negative.   Musculoskeletal: Negative.   Skin: Negative.   Neurological: Negative.   Endo/Heme/Allergies: Negative.   Psychiatric/Behavioral: Negative.      Objective:   Vitals:   01/11/16 1054  BP: 112/76  Pulse: 80  Resp: 18          Physical Exam  Constitutional: She is well-developed, well-nourished, and in no distress.  HENT:  Head: Normocephalic.  Right Ear: Tympanic membrane, external ear and ear canal normal.  Left Ear: Tympanic membrane, external ear and ear canal normal.  Nose: Nose normal. No mucosal edema or rhinorrhea.  Mouth/Throat: Uvula is midline, oropharynx is clear and moist and mucous membranes are normal. No oropharyngeal exudate.  Eyes: Conjunctivae are normal.  Neck: Trachea normal. No tracheal tenderness present. No tracheal deviation present. No thyromegaly present.  Cardiovascular: Normal rate, regular rhythm, S1 normal, S2 normal and normal heart sounds.   No murmur heard. Pulmonary/Chest: Breath sounds normal. No stridor. No respiratory distress. She has no wheezes. She has no rales.  Musculoskeletal: She exhibits no edema.  Lymphadenopathy:       Head (right side): No tonsillar adenopathy present.       Head (left side): No tonsillar  adenopathy present.    She has no cervical adenopathy.  Neurological: She is alert. Gait normal.  Skin: Rash (Multiple small slightly erythematous papular lesions involving bilateral lower extremities without evidence of purpura, discharge, tenderness, or heat) noted. She is not diaphoretic. No erythema. Nails show no clubbing.  Psychiatric: Mood and affect normal.    Diagnostics:    Spirometry was performed and demonstrated an FEV1 of 2.03 at 97 % of predicted.  The patient had an Asthma Control Test with the following results:  .    Assessment and Plan:   1. Inflammatory dermatosis   2. Moderate persistent asthma, uncomplicated   3. Allergic rhinoconjunctivitis   4. Immunosuppression ([place])     1. Mometasone 0.1% ointment applied to lower extremities twice a day until rash resolved  2. Continue Symbicort 160 2 inhalations twice a day  3. Continue montelukast 10 mg one tablet once a day  4. Continue nasal Azelastine 2 sprays each nostril twice a day  5. Continue cetirizine 10 mg one tablet once a day  6. Continue ProAir HFA 2 puffs every 4-6 hours if needed  7. Obtain fall flu vaccine  8. Return to clinic in November 2017 or earlier if problem  Jaelee has very good control of her airway issue at this point in time and I see very little need for changing her Symbicort and montelukast and nasal antihistamine. I have given her a more potent form of topical steroid to treat her dermatitis which at this point in time I will assume is a contact dermatitis. This should get better within the next 7-10 days. I've asked her to contact me should this continue to be a significant issue during that timeframe. We always have to consider the possibility that she has some type of unusual presentation for systemic or infectious disease given her immunosuppression. If she does well we will see her back in this clinic in November 2017 or earlier if there is a problem.  Allena Katz, MD [place]

## 2016-01-11 NOTE — Patient Instructions (Addendum)
  1. Mometasone 0.1% ointment applied to lower extremities twice a day until rash resolved  2. Continue Symbicort 160 2 inhalations twice a day  3. Continue montelukast 10 mg one tablet once a day  4. Continue nasal Azelastine 2 sprays each nostril twice a day  5. Continue cetirizine 10 mg one tablet once a day  6. Continue ProAir HFA 2 puffs every 4-6 hours if needed  7. Obtain fall flu vaccine  8. Return to clinic in November 2017 or earlier if problem

## 2016-01-24 ENCOUNTER — Telehealth: Payer: Self-pay | Admitting: Family Medicine

## 2016-01-24 NOTE — Telephone Encounter (Signed)
If she is > 1 week late, please have her come to be seen in our clinic either by myself or another provider so we can run some tests.   Thanks, [name], MD [facility] Resident  01/24/2016, 4:26 PM

## 2016-01-24 NOTE — Telephone Encounter (Signed)
Pt had her period June 18. She did one in July.  She didn't have one this month. She is concerned

## 2016-01-24 NOTE — Telephone Encounter (Signed)
Pt scheduled for an appt. Deseree Blount, CMA  

## 2016-01-27 ENCOUNTER — Telehealth: Payer: Self-pay | Admitting: Family Medicine

## 2016-01-27 NOTE — Telephone Encounter (Signed)
Pt stated that it started on Monday with pain in back that went away.  Now it is shoulder pain.  Pain is really bad when taking a deep breathe.  Took Tylenol with minimal relief.  Patient denies chest pain, headache.  Patient report short of breath.  Pt stated the pain was really bad at one point, she was very nausea.  Precept with Dr. Nori Riis.  Patient should be seen in the ED.  Patient voiced understanding.  Derl Barrow, RN

## 2016-01-27 NOTE — Telephone Encounter (Signed)
Left voice message for patient to go to urgent care or ED if she can not tolerate the pain.  Pt to call clinic with other questions/concerns.  Derl Barrow, RN

## 2016-01-27 NOTE — Telephone Encounter (Signed)
Having some left shoulder pain that worsens when she takes deep breaths. Would like to speak to a nurse or MD. Please advise.

## 2016-02-03 ENCOUNTER — Ambulatory Visit: Payer: Self-pay | Admitting: Family Medicine

## 2016-03-08 ENCOUNTER — Ambulatory Visit: Payer: Self-pay | Admitting: Family Medicine

## 2016-03-15 ENCOUNTER — Ambulatory Visit: Payer: Self-pay | Admitting: Family Medicine

## 2016-03-19 ENCOUNTER — Other Ambulatory Visit: Payer: Self-pay | Admitting: *Deleted

## 2016-03-19 MED ORDER — MONTELUKAST SODIUM 10 MG PO TABS: 10.0000 mg | ORAL_TABLET | Freq: Every day | ORAL | 5 refills | Status: DC

## 2016-03-29 ENCOUNTER — Encounter: Payer: Self-pay | Admitting: Family Medicine

## 2016-03-29 ENCOUNTER — Ambulatory Visit (INDEPENDENT_AMBULATORY_CARE_PROVIDER_SITE_OTHER): Payer: Medicaid Other | Admitting: Family Medicine

## 2016-03-29 VITALS — BP 138/79 | HR 102 | Temp 97.7°F | Ht 60.0 in | Wt 214.6 lb

## 2016-03-29 DIAGNOSIS — Z79899 Other long term (current) drug therapy: Secondary | ICD-10-CM

## 2016-03-29 DIAGNOSIS — F200 Paranoid schizophrenia: Secondary | ICD-10-CM

## 2016-03-29 DIAGNOSIS — Z23 Encounter for immunization: Secondary | ICD-10-CM

## 2016-03-29 DIAGNOSIS — Z0000 Encounter for general adult medical examination without abnormal findings: Secondary | ICD-10-CM | POA: Diagnosis not present

## 2016-03-29 DIAGNOSIS — Z124 Encounter for screening for malignant neoplasm of cervix: Secondary | ICD-10-CM | POA: Diagnosis not present

## 2016-03-29 LAB — POCT GLYCOSYLATED HEMOGLOBIN (HGB A1C): Hemoglobin A1C: 5.3

## 2016-03-29 NOTE — Patient Instructions (Addendum)
It was good to see you again.  We did a Pap smear today, I suspect this will be normal. I will call you if the results are not normal, I will send you a letter in the mail if they are normal. He received the flu vaccine today. I will screen you for diabetes given the multiple medications you are on. In January, you will be due for fasting lipid panels. He can check these at your next appointment.   Good work on the weight loss! Please follow-up with me in 3 months or sooner as needed.

## 2016-03-29 NOTE — Assessment & Plan Note (Signed)
Due for annual influenza today, will receive. Pap smear today, currently doing annually given she's immunocompromised due to h/o transplant, however given low risk behaviors could consider spreading these out. Mammogram normal on 08/2015. Patient will f/u for fasting labs at her next appt in 3 months.

## 2016-03-29 NOTE — Progress Notes (Signed)
40 y.o. year old female presents for an annual GYN examination, as when she came for her last annual, she was on her period and preferred to defer her pap smear.   Acute Concerns: Missed her daycare too many days and now she can't go anymore. She has a Education officer, museum, but has not touched base with her social worker to see if there is another daycare that she can attend. She notes now, she stays home most of the time.   Diet: Varied. She has been trying to eat healthier since working with nutrition.  Exercise: She notes the exercise she receives is walking around her apartment.  Sexual/Birth History: Never sexually active. Not on any form of contraception. LMP is 01/27/2016. She notes that she has irregular menses at baseline.   Social:  Social History   Social History  . Marital status: Single    Spouse name: N/A  . Number of children: 0  . Years of education: 2 th   Occupational History  .      Disabled   Social History Main Topics  . Smoking status: Never Smoker  . Smokeless tobacco: Never Used  . Alcohol use No  . Drug use: No  . Sexual activity: No   Other Topics Concern  . None   Social History Narrative   Lives alone but uncle and his wife live next door and are her primary caregivers. Sonia Side (uncle) can be reached at [phone].    Education High school education.   Disabled.   Right handed.   Caffeine tea and coffee daily.    Immunization: Immunization History  Administered Date(s) Administered  . H1N1 05/10/2008  . Influenza Split 02/09/2011, 02/13/2012  . Influenza Whole 02/20/2007, 02/16/2008, 03/04/2009, 03/03/2010  . Influenza,inj,Quad PF,36+ Mos 03/09/2013, 03/05/2014, 05/19/2015, 03/29/2016  . PPD Test 04/27/2013, 06/22/2013  . Pneumococcal Polysaccharide-23 03/01/2013  . Td 05/29/2003, 02/07/2010      Physical Exam: Blood pressure 138/79, pulse (!) 102, temperature 97.7 F (36.5 C), temperature source Oral, height 5' (1.524 m), weight 214 lb 9.6  oz (97.3 kg).  GEN: Pleasant female, NAD HEENT: Normocephalic, PERRL, EOMI, no scleral icterus, bilateral TM pearly grey, nasal septum midline, MMM, uvula midline, no anterior or posterior lymphadenopathy, no thyromegaly CARDIAC:RRR, S1 and S2 present, no murmur, no heaves/thrills RESP: CTAB, normal effort ABD: soft, no tenderness, normal bowel sounds GU/GYN:Exam performed in the presence of a chaperone.  External genitalia within normal limits.  Vaginal mucosa pink, moist, normal rugae.  Nonfriable cervix without lesions, no discharge or bleeding noted on speculum exam.  Bimanual exam revealed normal, nongravid uterus.  No cervical motion tenderness. No adnexal masses bilaterally.   EXT: No edema, 2+ radial and DP pulses SKIN: no rash  ASSESSMENT & PLAN: 40 y.o. female presents for a pap smear.  Please see problem specific assessment and plan.   Schizophrenia, paranoid type Patient sees behavioral health who prescribes her medication. It is not currently time for her fasting lipid panel. Given long term risperdal use, will get A1c today.   Health care maintenance Due for annual influenza today, will receive. Pap smear today, currently doing annually given she's immunocompromised due to h/o transplant, however given low risk behaviors could consider spreading these out. Mammogram normal on 08/2015. Patient will f/u for fasting labs at her next appt in 3 months.

## 2016-03-29 NOTE — Assessment & Plan Note (Signed)
Patient sees behavioral health who prescribes her medication. It is not currently time for her fasting lipid panel. Given long term risperdal use, will get A1c today.

## 2016-03-30 ENCOUNTER — Other Ambulatory Visit (HOSPITAL_COMMUNITY)
Admission: RE | Admit: 2016-03-30 | Discharge: 2016-03-30 | Disposition: A | Discharge status: Home / Self Care | Payer: Medicaid Other | Source: Ambulatory Visit | Attending: Family Medicine | Admitting: Family Medicine

## 2016-03-30 DIAGNOSIS — Z01419 Encounter for gynecological examination (general) (routine) without abnormal findings: Secondary | ICD-10-CM | POA: Insufficient documentation

## 2016-03-30 DIAGNOSIS — Z1151 Encounter for screening for human papillomavirus (HPV): Secondary | ICD-10-CM | POA: Insufficient documentation

## 2016-03-30 NOTE — Addendum Note (Signed)
Addended by: Junious Dresser on: 03/30/2016 04:06 PM   Modules accepted: Orders

## 2016-04-03 ENCOUNTER — Ambulatory Visit (INDEPENDENT_AMBULATORY_CARE_PROVIDER_SITE_OTHER): Payer: Medicaid Other | Admitting: Allergy and Immunology

## 2016-04-03 ENCOUNTER — Encounter: Payer: Self-pay | Admitting: Allergy and Immunology

## 2016-04-03 VITALS — BP 126/72 | HR 86 | Resp 20

## 2016-04-03 DIAGNOSIS — J4540 Moderate persistent asthma, uncomplicated: Secondary | ICD-10-CM

## 2016-04-03 DIAGNOSIS — D849 Immunodeficiency, unspecified: Secondary | ICD-10-CM

## 2016-04-03 DIAGNOSIS — L989 Disorder of the skin and subcutaneous tissue, unspecified: Secondary | ICD-10-CM

## 2016-04-03 DIAGNOSIS — D899 Disorder involving the immune mechanism, unspecified: Secondary | ICD-10-CM | POA: Diagnosis not present

## 2016-04-03 DIAGNOSIS — L308 Other specified dermatitis: Secondary | ICD-10-CM | POA: Diagnosis not present

## 2016-04-03 DIAGNOSIS — J3089 Other allergic rhinitis: Secondary | ICD-10-CM | POA: Diagnosis not present

## 2016-04-03 LAB — CYTOLOGY - PAP
ADEQUACY: ABSENT
Diagnosis: NEGATIVE
HPV: NOT DETECTED

## 2016-04-03 NOTE — Patient Instructions (Signed)
  1. Continue Mometasone 0.1% ointment one-2 times a day if needed resolved  2. Continue Symbicort 160 2 inhalations twice a day  3. Continue montelukast 10 mg one tablet once a day  4. Continue nasal Azelastine 2 sprays each nostril twice a day  5. Continue cetirizine 10 mg one tablet once a day  6. Continue ProAir HFA 2 puffs every 4-6 hours if needed  7. Return to clinic in February 2018 or earlier if problem

## 2016-04-03 NOTE — Progress Notes (Signed)
Follow-up Note  Referring Provider: [name], MD Primary Provider: Kathrine Cords, MD Date of Office Visit: 04/03/2016  Subjective:   [name] (DOB: 24-[name]-1976) is a 40 y.o. female who returns to the [facility] on 04/03/2016 in re-evaluation of the following:  HPI: [name] returns to this clinic in reevaluation of her asthma and allergic rhinoconjunctivitis and lower extremity dermatitis. I've not seen her in his clinic since August 2017. At that point in time she appeared to have a new onset dermatitis affecting her lower extremities for which we gave her a topical steroid to use.  Her dermatitis has basically resolved and she only uses mometasone one or 2 times per month. Her dermatitis never evolved into any other issue and never was accompanied by associated systemic or constitutional symptoms.  Her asthma has been under excellent control and she has not required a systemic steroid to treat an episode of asthma exacerbation nor does she use a short acting bronchodilator. She exercises by walking and doing a video tape and she has no problem performing exercise. She continues on her Symbicort and montelukast consistently.  She has had very little problem with her upper airway while consistently using a nasal antihistamine spray.  Carolin did receive the flu vaccine.  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    Medication List      ACCU-CHEK AVIVA PLUS test strip Generic drug:  glucose blood Use to check blood sugar daily   ACCU-CHEK SOFTCLIX LANCETS lancets Use to check blood sugar once daily in the morning prior to eating.   acetaminophen 325 MG tablet Commonly known as:  TYLENOL Take 650 mg by mouth every 4 (four) hours as needed for mild pain or headache.   ARTIFICIAL TEARS OP Apply 1 drop to eye as needed.   azelastine 0.1 % nasal spray Commonly known as:  ASTELIN Place 2 sprays into  both nostrils 2 (two) times daily. Use in each nostril as directed   BENEFIBER DRINK MIX PO Take 1 scoop by mouth 3 (three) times a week. Mon, Wed, Fri   benztropine 0.5 MG tablet Commonly known as:  COGENTIN Take 0.5 mg by mouth at bedtime.   budesonide-formoterol 160-4.5 MCG/ACT inhaler Commonly known as:  SYMBICORT Inhale 2 puffs into the lungs 2 (two) times daily.   Cetirizine HCl 10 MG Caps Take 1 capsule (10 mg total) by mouth daily.   cholecalciferol 1000 units tablet Commonly known as:  VITAMIN D Take 2,000 Units by mouth daily.   cycloSPORINE modified 100 MG Caps Take 100-125 mg by mouth 2 (two) times daily. Take 100 mg in the AM and 125 mg in the PM   cycloSPORINE modified 25 MG capsule Commonly known as:  NEORAL Use in combination with 100 mg capsules for PM dose of 125 mg.   divalproex 500 MG 24 hr tablet Commonly known as:  DEPAKOTE ER Take 2 tablets (1,000 mg total) by mouth at bedtime.   doxepin 10 MG capsule Commonly known as:  SINEQUAN Take 10 mg by mouth at bedtime.   hydrocortisone cream 0.5 % Apply 1 application topically 2 (two) times daily.   LAC-HYDRIN 12 % cream Generic drug:  ammonium lactate Apply topically 2 (two) times daily.   magnesium oxide 400 MG tablet Commonly known as:  MAG-OX Take 400 mg by mouth 2 (two) times daily. 5pm & 7pm.   mometasone 0.1 % cream Commonly known as:  ELOCON Apply 1 application topically 2 (two) times daily.   montelukast 10 MG tablet Commonly known as:  SINGULAIR Take 1 tablet (10 mg total) by mouth at bedtime.   multivitamin tablet Take 1 tablet by mouth daily. 9 am.   mycophenolate 250 MG capsule Commonly known as:  CELLCEPT Take 500 mg by mouth 2 (two) times daily. 9 am & 9 pm.   omeprazole 40 MG capsule Commonly known as:  PRILOSEC Take 40 mg by mouth daily. 9 am.   polyethylene glycol packet Commonly known as:  MIRALAX / GLYCOLAX Take 17 g by mouth daily as needed (for constipation). In  water or juice   prazosin 1 MG capsule Commonly known as:  MINIPRESS Take 2 mg by mouth at bedtime.   PROAIR HFA 108 (90 Base) MCG/ACT inhaler Generic drug:  albuterol Inhale 1 puff into the lungs every 4 (four) hours as needed for shortness of breath.   REFRESH TEARS 0.5 % Soln Generic drug:  carboxymethylcellulose Place 1 drop into both eyes 4 (four) times daily as needed (for dry eyes).   risperiDONE 3 MG tablet Commonly known as:  RISPERDAL Take 3 mg by mouth at bedtime.   VALCYTE PO Take 900 mg by mouth daily.       Past Medical History:  Diagnosis Date  . Abnormal vaginal bleeding   . Allergy   . Anemia   . Arthritis    right knee  . Asthma 09-23-12   Asthma somewhat causing increase wheezing and mild congestion at present  . Biliary cirrhosis ([place]) 04/26/11  . Constipation   . DUB (dysfunctional uterine bleeding)   . GERD (gastroesophageal reflux disease)   . Intellectual disability   . PONV (postoperative nausea and vomiting)   . 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 Seizures ([place])    congenital epilepsy-hx. seizures  . Vitamin D deficiency     Past Surgical History:  Procedure Laterality Date  . adenonoidectomy    . APPENDECTOMY    . CHOLECYSTECTOMY    . COLONOSCOPY W/ BIOPSIES    . ESOPHAGOGASTRODUODENOSCOPY (EGD) WITH PROPOFOL N/A 10/01/2012   Procedure: ESOPHAGOGASTRODUODENOSCOPY (EGD) WITH PROPOFOL;  Surgeon: Arta Silence, MD;  Location: [facility];  Service: Endoscopy;  Laterality: N/A;  . eustacean tubes    . EYE MUSCLE SURGERY    . LIVER TRANSPLANT      Allergies  Allergen Reactions  . Tramadol Hcl Itching and Nausea And Vomiting    Causes SEIZURES  . Aspirin Nausea And Vomiting    Due to Stomach ulcer  . Ibuprofen Other (See Comments)    Due to stomach ulcer  . Penicillins Itching, Swelling and Rash    Breakout     . Pseudoephedrine Itching and Swelling    Tongue swelling  . Sulfa Antibiotics Nausea And Vomiting  . Latex  Itching and Rash    Review of systems negative except as noted in HPI / PMHx or noted below:  Review of Systems  Constitutional: Negative.   HENT: Negative.   Eyes: Negative.   Respiratory: Negative.   Cardiovascular: Negative.   Gastrointestinal: Negative.   Genitourinary: Negative.   Musculoskeletal: Negative.   Skin: Negative.   Neurological: Negative.   Endo/Heme/Allergies: Negative.   Psychiatric/Behavioral: Negative.      Objective:   Vitals:   04/03/16 1031  BP: 126/72  Pulse: 86  Resp: 20          Physical Exam  Constitutional: She is well-developed, well-nourished, and in no  distress.  HENT:  Head: Normocephalic.  Right Ear: Tympanic membrane, external ear and ear canal normal.  Left Ear: Tympanic membrane, external ear and ear canal normal.  Nose: Nose normal. No mucosal edema or rhinorrhea.  Mouth/Throat: Uvula is midline, oropharynx is clear and moist and mucous membranes are normal. No oropharyngeal exudate.  Eyes: Conjunctivae are normal.  Neck: Trachea normal. No tracheal tenderness present. No tracheal deviation present. No thyromegaly present.  Cardiovascular: Normal rate, regular rhythm, S1 normal, S2 normal and normal heart sounds.   No murmur heard. Pulmonary/Chest: Breath sounds normal. No stridor. No respiratory distress. She has no wheezes. She has no rales.  Musculoskeletal: She exhibits no edema.  Lymphadenopathy:       Head (right side): No tonsillar adenopathy present.       Head (left side): No tonsillar adenopathy present.    She has no cervical adenopathy.  Neurological: She is alert. Gait normal.  Skin: No rash noted. She is not diaphoretic. No erythema. Nails show no clubbing.  Psychiatric: Mood and affect normal.    Diagnostics:    Spirometry was performed and demonstrated an FEV1 of 1.67 at 79 % of predicted.  Assessment and Plan:   1. Moderate persistent asthma, uncomplicated   2. Other allergic rhinitis   3.  Immunosuppression ([place])   4. Inflammatory dermatosis     1. Continue Mometasone 0.1% ointment one-2 times a day if needed  2. Continue Symbicort 160 2 inhalations twice a day  3. Continue montelukast 10 mg one tablet once a day  4. Continue nasal Azelastine 2 sprays each nostril twice a day  5. Continue cetirizine 10 mg one tablet once a day  6. Continue ProAir HFA 2 puffs every 4-6 hours if needed  7. Return to clinic in February 2018 or earlier if problem  Felicidad has done very well and I'm going to continue to have her use anti-inflammatory agents for her upper and lower airway as noted above as well as the as needed use of a topical steroid for her inflammatory dermatosis which appears to have burnt out at this point in time. I will see her back in this clinic in February 2018 or earlier if there is a problem.  Allena Katz, MD [place]

## 2016-04-25 ENCOUNTER — Other Ambulatory Visit: Payer: Self-pay | Admitting: Family Medicine

## 2016-04-26 ENCOUNTER — Encounter: Payer: Self-pay | Admitting: Family Medicine

## 2016-04-26 ENCOUNTER — Ambulatory Visit (INDEPENDENT_AMBULATORY_CARE_PROVIDER_SITE_OTHER): Payer: Medicaid Other | Admitting: Family Medicine

## 2016-04-26 VITALS — Ht 60.0 in | Wt 216.4 lb

## 2016-04-26 DIAGNOSIS — E669 Obesity, unspecified: Secondary | ICD-10-CM

## 2016-04-26 DIAGNOSIS — Z713 Dietary counseling and surveillance: Secondary | ICD-10-CM

## 2016-04-26 DIAGNOSIS — IMO0001 Reserved for inherently not codable concepts without codable children: Secondary | ICD-10-CM

## 2016-04-26 DIAGNOSIS — Z6841 Body Mass Index (BMI) 40.0 and over, adult: Secondary | ICD-10-CM

## 2016-04-26 NOTE — Patient Instructions (Addendum)
GOALS:  1. Replace sweets/ desserts with fruits 2. Choose 1 snack from fruit/veggies group 3. Increase your water intake, decrease your juice intake.  Drink water with your medicines.  - Only drink drinks withOUT sugar, water is the best!   - Make sure tea is sugar free    If you do not receive your [name] Menu this week, please call them again for a copy.

## 2016-04-26 NOTE — Progress Notes (Signed)
Medical Nutrition Therapy:  Appt start time: 1000a end time:  1100a.  Assessment:  Primary concerns today: Weight management.   Learning Readiness:   Ready/Change in progress  Patient notes that she has been taking 1-3 snacks from the [name] nutrition list, including Goldfish, Fiber one bars, salsa and chips, 1/2 small pack of peanuts (25 peanuts) and some type of whole wheat crackers.  Drinking Diet tea, water, fruit punch 12 ounces w/ medicine (possibly up to 4 times daily), [employer] brand cherry juice twice daily (12 ounces).  Frequent foods:  Patient is having tuna for dinner tonight. Patient notes she misplaced her [place] and she is waiting for a new one.    Tae bow 1 hour daily, incl weekends.  "Sweat to the oldies" with Charlean Sanfilippo daily.  She reports that she is no longer going to her Day Program.  She notes she exercises at home.  She does have a helper that comes in daily that helps with dinner.  She does not help her with exercise.  The aid comes at 4pm each day and stays until 5:45pm.  She helps with some chores and dinner.  24-hr recall: (Up at 5 AM) stayed up because her RN was coming and she wanted to be ready for that. (she comes each Wednesday to fill patient's pill case and weigh her) B (9 AM)-  Chocolate cookie (got from aid the night before), cherry juice (6 oz) Snk ( AM)-  none L (12:30 PM)- homemade burrito (white flour tortilla, roast beef, salsa, sour cream), small salad (shredded carrots, cherry tomatoes, 2 tbs thousand [place] dressing)      Snk (1:30 PM)- BBQ potato chips D ( 4:58 PM)- breaded baked frozen flounder, large bowl of mixed veggies (2 c), tater tots, birthday cake, cherry juice 8 ounces, pineapple w/ cottage cheese    Snk (9 PM)- birthday cake    Typical day? No. birthday cake is not normal. No fruit punch yesterday because she ran out  Sleep Habits  - Getting 6-7 hours.  She is unsure as to what time she is going to sleep, as her antenna  is not working and she does not have TV programs to determine what time she goes to bed anymore. - Wakes up at 5am daily so she can call her aunt to wake up for work, then she goes back to sleep until [place] is a 40 y.o. female with a history of PBC s/p liver transplant here for follow up nutritional counseling for worsening obesity.   Progress Towards Goal(s):  Some progress.    Previous visit's goals:  Nutritional and Exercise Goals  1. Eat 1-3 snacks a day that come from list provided by [name].  2. Obtain twice as many veg's as protein or carbohydrate foods for both lunch and dinner. 3. Exercise 60 minutes every day including on Monday, Thursday, and Friday doing "Sweat to the [place]" at 3 PM  New Goals:  1. Replace sweets/ desserts with fruits 2. Choose 1 snack from fruit/ veggie group 3. Reduce juice intake. Increase water intake.    -Only drink beverages without sugar.    -Replace beverage taken with medicines with milk or sugar free tea.  Patient to call [name] back for menu if she does not get it this week.  Handouts given during visit include:  AVS   Demonstrated degree of understanding via:  Teach Back  Barriers to learning/adherence to lifestyle change: Dx with  MR, though cognition is sufficient for understanding goals and strategies for achieving them  Total time spent with patient 60 minutes.  Greater than 50% of encounter spent in nutrition counseling.  Kimothy Kishimoto M. Lajuana Ripple, DO PGY-3, [facility]

## 2016-05-03 ENCOUNTER — Other Ambulatory Visit: Payer: Self-pay

## 2016-05-03 MED ORDER — CETIRIZINE HCL 10 MG PO CAPS: 10.0000 mg | ORAL_CAPSULE | Freq: Every day | ORAL | 5 refills | Status: DC

## 2016-05-31 ENCOUNTER — Ambulatory Visit: Payer: Self-pay | Admitting: Family Medicine

## 2016-06-07 ENCOUNTER — Ambulatory Visit (INDEPENDENT_AMBULATORY_CARE_PROVIDER_SITE_OTHER): Payer: Medicaid Other | Admitting: Internal Medicine

## 2016-06-07 ENCOUNTER — Encounter: Payer: Self-pay | Admitting: Internal Medicine

## 2016-06-07 VITALS — Ht 60.0 in | Wt 212.8 lb

## 2016-06-07 DIAGNOSIS — Z6841 Body Mass Index (BMI) 40.0 and over, adult: Secondary | ICD-10-CM

## 2016-06-07 DIAGNOSIS — E669 Obesity, unspecified: Secondary | ICD-10-CM

## 2016-06-07 DIAGNOSIS — IMO0001 Reserved for inherently not codable concepts without codable children: Secondary | ICD-10-CM

## 2016-06-07 NOTE — Patient Instructions (Addendum)
It was nice meeting you today Ms. Kempfer!  Goals: 1. Eat 1-3 snacks a day that come from list provided by [name].  2. Obtain twice as many veg's as protein or carbohydrate foods for both lunch and dinner. 3. No more than 2 glasses of lemonade or juice per day. (Only use one cup of sugar in your pitcher of lemonade.)  - Only drink drinks withOUT sugar, water is the best!  Dr. Jenne Campus will see you again on Thursday, March 15 at 9 AM.   If you have any questions or concerns, please feel free to call the clinic.   Be well,  Dr. Avon Gully

## 2016-06-07 NOTE — Progress Notes (Signed)
Learning Readiness:   Change in progress  Is still not going to adult daycare. Stays home, exercises and watching TV now.  Nurse comes once a week (Wednesdays at [facility]). Gets up between 7-9 AM on days when nurse isn't coming. Aid comes seven days a week at [place] part of the reason she has lost weight is because she has a new aid who cooks fresh food that is healthier. New aid also doesn't buy or give her chocolates or cookies. Says eating more vegetables now than she used to thanks to new aid.   Exercise: TV broke so can't do [employer] "Sweat To The Oldies" tape anymore. Dancing to [employer] on the radio and some calisthenics at home. Exercising every day for two hours.    Diet: Now drinking ~2 8 oz cups of juice (homemade lemonade with 2 cups of sugar in pitcher - probably a half gallon pitcher) a day now.    24-hr recall: (Up at 5 AM)  B ( 9:50 AM)-   Two packets of instant grits with cheese, diet green tea    Snk ( AM)-   None L ( 12 PM)-  Leftover meatloaf (cooked by aid), cabbage, spinach, diet green tea Snk ( PM)-  None D ( 4:30 PM)-  Honey barbecue chicken wings, cabbage, spinach, water, yogurt Snk ( 7 PM)-  Peanuts (one pack) Typical day? Yes.    Handouts given during visit include:  AVS  Goals sheet  Goals: 1. Eat 1-3 snacks a day that come from list provided by [name].  2. Obtain twice as many veg's as protein or carbohydrate foods for both lunch and dinner. 3. No more than 2 glasses of lemonade or juice per day. (Only use one cup of sugar in your pitcher of lemonade.)  - Only drink drinks withOUT sugar, water is the best!  Has nutrition appointment at [facility] in February.  F/u with Dr. Jenne Campus scheduled for 03/15.  Adin Hector, MD, MPH PGY-2 [place] [facility] Pager [phone]

## 2016-06-12 ENCOUNTER — Ambulatory Visit (INDEPENDENT_AMBULATORY_CARE_PROVIDER_SITE_OTHER): Payer: Medicaid Other | Admitting: Nurse Practitioner

## 2016-06-12 ENCOUNTER — Encounter: Payer: Self-pay | Admitting: Nurse Practitioner

## 2016-06-12 VITALS — BP 126/80 | HR 100 | Ht 60.0 in | Wt 217.2 lb

## 2016-06-12 DIAGNOSIS — G40909 Epilepsy, unspecified, not intractable, without status epilepticus: Secondary | ICD-10-CM

## 2016-06-12 NOTE — Patient Instructions (Addendum)
Continue Depakote 500 mg extended release 2 tablets daily Will check Depakote level today Follow-up in 6 months Call for any seizure activity

## 2016-06-12 NOTE — Progress Notes (Signed)
[facility]  PATIENT: [name] DOB: Sep 22, 1975   REASON FOR VISIT: Follow-up for seizure disorder HISTORY FROM: Patient    HISTORY OF PRESENT ILLNESS:HISTORY 03/23/14 [name]): [name], 41 year old black female returns today for followup of epilepsy. her uncle is [name], He is the power of attorney for [name]. She has a history of mild mental retardation, and long-standing history of seizures. She was well-controlled on Depakote and Tegretol extended release. But she has developed liver disease, was diagnosed with primary biliary cirrhosis,Antieptical medication was changed since February 2013, she is now taking Vimpat 100 mg 2 tabs twice a day, Keppra 500 mg 3 tabs daily. She has no significant seizure activities while taking Vimpat, and Keppra.She lives alone, uncles lives next door, she attends adult daycare regularly.She underwent liver transplantation in February 2015, doing very well, on polypharmacy treatment, this including CellCept 1000 mg twice a day, Prograft 4 mg twice a day,For her seizure, she is now taking deapkote ER 250mg  5 tabs qhs, she is having seizure about once amonth, it usually happens at nighttime, she was noted to have nighttime extremity jumping movement, tongue biting when she woke up, she also complains of mild gait difficullty,   UPDATE Oct 27th 2015:I have talked with [name] liver transplant cordinator, reported, 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, at [facility], Keppra, Vimpat cause worsening behavior issue, she had a hallucinations, paranoid to the point of almost institutionalized, her mood disorder eventually was under control with Depakote ER 250 mg 5 tablets each day, [name] continues to complains that she had a 2 episode of waking up with blood in her mouth, has bitten her tongue, one in July, the second one was September 2015, overall she is tolerating the medication well, Depakote level was 63 in February 23 2014, EEG was  normal.Repeat laboratory evaluation in March 09 2014, showed decreased white count 1.6, she is receiving weekly laboratory evaluation by [name] transplant team, I have called coordinator again, is aware of her leukopenia, she is on lower dose of immunosuppressive treatment, now taking cyclosporine 200 mg twice a day, CellCept 250 mg twice a day,She has personal aid every evening  UPDATE December 08 2015:yy She is brought in by [name] transportation today, I reviewed and summarized her to visit in January 2017, "Patient's current immunosuppression regimen is Cyclosporine 100/125mg  and Mycophenolate 500MG  BID". She has no clinical features concerning for complications of immunosuppressive drug therapy,  She is now taking Depakote DR 250mg  4 tabs qhs, she reported one episode of left hand shaking in July 2017.    Laboratory evaluation in January 2017, Depakote level 78, CBC showed mild anemia, hemoglobin 9.6, normal CMP, Update 01/16/2018CM. [name], 41 year old female returns for follow-up for seizure disorder. She has a history of liver transplant in 2015. She is currently on Depakote extended release 500 mg 2 at bedtime. She reports no seizure activity in several years. She continues to live alone, uncle lives next door. She has no new neurologic complaints. Labs from [facility] reviewed from 12/21/2015. She has not had a recent VPA level. She returns for reevaluation.   REVIEW OF SYSTEMS: Full 14 system review of systems performed and notable only for those listed, all others are neg:  Constitutional: neg  Cardiovascular: neg Ear/Nose/Throat: neg  Skin: neg Eyes: neg Respiratory: neg Gastroitestinal: neg  Hematology/Lymphatic: neg  Endocrine: neg Musculoskeletal:neg Allergy/Immunology: neg Neurological: neg Psychiatric: neg Sleep : neg   ALLERGIES: Allergies  Allergen Reactions  .  Tramadol Hcl Itching and Nausea And Vomiting    Causes SEIZURES  . Aspirin Nausea And Vomiting     Due to Stomach ulcer  . Ibuprofen Other (See Comments)    Due to stomach ulcer  . Penicillins Itching, Swelling and Rash    Breakout     . Pseudoephedrine Itching and Swelling    Tongue swelling  . Sulfa Antibiotics Nausea And Vomiting  . Latex Itching and Rash    HOME MEDICATIONS: Outpatient Medications Prior to Visit  Medication Sig Dispense Refill  . ACCU-CHEK AVIVA PLUS test strip TEST once daily 100 each 1  . ACCU-CHEK SOFTCLIX LANCETS lancets Use to check blood sugar once daily in the morning prior to eating. 100 each 2  . acetaminophen (TYLENOL) 325 MG tablet Take 650 mg by mouth every 4 (four) hours as needed for mild pain or headache.     Marland Kitchen ammonium lactate (LAC-HYDRIN) 12 % cream Apply topically 2 (two) times daily.     Marland Kitchen azelastine (ASTELIN) 0.1 % nasal spray Place 2 sprays into both nostrils 2 (two) times daily. Use in each nostril as directed 30 mL 3  . benztropine (COGENTIN) 0.5 MG tablet Take 0.5 mg by mouth at bedtime.    . budesonide-formoterol (SYMBICORT) 160-4.5 MCG/ACT inhaler Inhale 2 puffs into the lungs 2 (two) times daily. 1 Inhaler 5  . carboxymethylcellulose (REFRESH TEARS) 0.5 % SOLN Place 1 drop into both eyes 4 (four) times daily as needed (for dry eyes).    . Cetirizine HCl 10 MG CAPS Take 1 capsule (10 mg total) by mouth daily. 30 capsule 5  . cholecalciferol (VITAMIN D) 1000 UNITS tablet Take 2,000 Units by mouth daily.    . cycloSPORINE modified (NEORAL) 25 MG capsule Use in combination with 100 mg capsules for PM dose of 125 mg.  1  . cycloSPORINE modified 100 MG CAPS Take 100-125 mg by mouth 2 (two) times daily. Take 100 mg in the AM and 125 mg in the PM    . divalproex (DEPAKOTE ER) 500 MG 24 hr tablet Take 2 tablets (1,000 mg total) by mouth at bedtime. 120 tablet 11  . doxepin (SINEQUAN) 10 MG capsule Take 10 mg by mouth at bedtime.    . magnesium oxide (MAG-OX) 400 MG tablet Take 400 mg by mouth 2 (two) times daily. 5pm & 7pm.    . mometasone  (ELOCON) 0.1 % cream Apply 1 application topically 2 (two) times daily. 45 g 5  . montelukast (SINGULAIR) 10 MG tablet Take 1 tablet (10 mg total) by mouth at bedtime. 34 tablet 5  . Multiple Vitamin (MULTIVITAMIN) tablet Take 1 tablet by mouth daily. 9 am.    . mycophenolate (CELLCEPT) 250 MG capsule Take 500 mg by mouth 2 (two) times daily. 9 am & 9 pm.    . omeprazole (PRILOSEC) 40 MG capsule Take 40 mg by mouth daily. 9 am.    . polyethylene glycol (MIRALAX / GLYCOLAX) packet Take 17 g by mouth daily as needed (for constipation). In water or juice    . prazosin (MINIPRESS) 1 MG capsule Take 2 mg by mouth at bedtime.     Marland Kitchen PROAIR HFA 108 (90 Base) MCG/ACT inhaler Inhale 1 puff into the lungs every 4 (four) hours as needed for shortness of breath. 1 Inhaler 3  . risperiDONE (RISPERDAL) 3 MG tablet Take 3 mg by mouth at bedtime.    . ValGANciclovir HCl (VALCYTE PO) Take 900 mg by mouth daily.    Marland Kitchen  Wheat Dextrin (BENEFIBER DRINK MIX PO) Take 1 scoop by mouth 3 (three) times a week. Mon, Wed, Fri     No facility-administered medications prior to visit.     PAST MEDICAL HISTORY: Past Medical History:  Diagnosis Date  . Abnormal vaginal bleeding   . Allergy   . Anemia   . Arthritis    right knee  . Asthma 09-23-12   Asthma somewhat causing increase wheezing and mild congestion at present  . Biliary cirrhosis ([place]) 04/26/11  . Constipation   . DUB (dysfunctional uterine bleeding)   . GERD (gastroesophageal reflux disease)   . Intellectual disability   . PONV (postoperative nausea and vomiting)   . 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 Seizures ([place])    congenital epilepsy-hx. seizures  . Vitamin D deficiency     PAST SURGICAL HISTORY: Past Surgical History:  Procedure Laterality Date  . adenonoidectomy    . APPENDECTOMY    . CHOLECYSTECTOMY    . COLONOSCOPY W/ BIOPSIES    . ESOPHAGOGASTRODUODENOSCOPY (EGD) WITH PROPOFOL N/A 10/01/2012   Procedure: ESOPHAGOGASTRODUODENOSCOPY  (EGD) WITH PROPOFOL;  Surgeon: Arta Silence, MD;  Location: [facility];  Service: Endoscopy;  Laterality: N/A;  . eustacean tubes    . EYE MUSCLE SURGERY    . LIVER TRANSPLANT      FAMILY HISTORY: Family History  Problem Relation Age of Onset  . Diabetes Mother   . Heart disease Mother   . COPD Mother     SOCIAL HISTORY: Social History   Social History  . Marital status: Single    Spouse name: N/A  . Number of children: 0  . Years of education: 70 th   Occupational History  .      Disabled   Social History Main Topics  . Smoking status: Never Smoker  . Smokeless tobacco: Never Used  . Alcohol use No  . Drug use: No  . Sexual activity: No   Other Topics Concern  . Not on file   Social History Narrative   Lives alone but uncle and his wife live next door and are her primary caregivers. Sonia Side (uncle) can be reached at [phone].    Education High school education.   Disabled.   Right handed.   Caffeine tea and coffee daily.     PHYSICAL EXAM  Vitals:   06/12/16 1012  Weight: 217 lb 3.2 oz (98.5 kg)  Height: 5' (1.524 m)   Body mass index is 42.42 kg/m. Generalized: Well developed, Obese female in no acute distress   Neurological examination  Mentation: Alert oriented to time, place, history taking. Follows all commands speech and language fluent Cranial nerve II-XII: Pupils were equal round reactive to light. Extraocular movements were full, visual field were full on confrontational test. Left exotropia Facial sensation and strength were normal. Uvula tongue midline. Head turning and shoulder shrug  were normal and symmetric. Motor: The motor testing reveals 5 over 5 strength of all 4 extremities. Good symmetric motor tone is noted throughout.  Sensory: Sensory testing is intact to soft touch on all 4 extremities. No evidence of extinction is noted.  Coordination: Cerebellar testing reveals good finger-nose-finger and heel-to-shin bilaterally.  Gait and  station: Gait is normal. Tandem gait is normal. Romberg is negative. No drift is seen.  Reflexes: Deep tendon reflexes are symmetric and normal bilaterally.   DIAGNOSTIC DATA (LABS, IMAGING, TESTING) - I reviewed patient records, labs, notes, testing and imaging myself where available.  Lab Results  Component  Value Date   WBC 3.7 06/09/2015   HGB 9.8 (L) 06/08/2014   HCT 29.7 (L) 06/09/2015   MCV 85 06/09/2015   PLT 153 06/09/2015      Component Value Date/Time   NA 140 06/09/2015 0957   K 4.9 06/09/2015 0957   CL 104 06/09/2015 0957   CO2 22 06/09/2015 0957   GLUCOSE 103 (H) 06/09/2015 0957   GLUCOSE 104 (H) 06/28/2014 0932   BUN 23 06/09/2015 0957   CREATININE 0.79 06/09/2015 0957   CREATININE 0.83 06/28/2014 0932   CALCIUM 8.9 06/09/2015 0957   CALCIUM 9.0 09/11/2007 2301   PROT 6.9 06/09/2015 0957   ALBUMIN 3.9 06/09/2015 0957   AST 11 06/09/2015 0957   ALT 7 06/09/2015 0957   ALKPHOS 63 06/09/2015 0957   BILITOT 0.3 06/09/2015 0957   GFRNONAA 93 06/09/2015 0957   GFRNONAA >89 11/09/2011 1208   GFRAA 108 06/09/2015 0957   GFRAA >89 11/09/2011 1208    Lab Results  Component Value Date   HGBA1C 5.3 03/29/2016   Lab Results  Component Value Date   VITAMINB12 616 08/13/2007   Lab Results  Component Value Date   TSH 4.80 (H) 10/11/2015      ASSESSMENT AND PLAN  41 y.o. year old female  has a past medical history of  Biliary cirrhosis ([place]) (04/26/11); Liver transplant 2015  Intellectual disability; (05/07/2011); Seizures ([place]); and Vitamin D deficiency. here to follow-up for her seizure disorder  PLAN: Continue Depakote 500 mg extended release 2 tablets daily Will check Depakote level today Follow-up in 6 months Call for any seizure activity Dennie Bible, [facility], [facility], [facility] [address] [place], [place] [address] [phone]

## 2016-06-13 ENCOUNTER — Ambulatory Visit: Payer: Self-pay | Admitting: Family Medicine

## 2016-06-13 LAB — VALPROIC ACID LEVEL: VALPROIC ACID LVL: 68 ug/mL (ref 50–100)

## 2016-06-15 ENCOUNTER — Telehealth: Payer: Self-pay | Admitting: *Deleted

## 2016-06-15 NOTE — Telephone Encounter (Signed)
Spoke to pt and relayed that her lab results were good, her valproic level was good level.  She verbalized understanding.

## 2016-06-15 NOTE — Progress Notes (Signed)
I have reviewed and agreed above plan. 

## 2016-06-15 NOTE — Telephone Encounter (Signed)
-----   Message from Dennie Bible, NP sent at 06/15/2016  6:36 AM EST ----- VPA level good please call the patient

## 2016-07-03 ENCOUNTER — Telehealth: Payer: Self-pay | Admitting: Family Medicine

## 2016-07-03 NOTE — Telephone Encounter (Signed)
Pt needs new orders for home health nurse to come out and give a bath and cook her food. Pt wasn't able to explain what home health agency she has been using. Pt states she needs the orders by Monday. ep

## 2016-07-04 NOTE — Telephone Encounter (Signed)
Contacted pt to give her the below message and she said that she does not have any forms to be completed and that she is going for an appeal on Monday because they are wanting to discontinue her health aid that helps her cook and bath. Routing to Dr. Lajuana Ripple to inform her to see about getting this completed for her visit. Katharina Caper, April D, [place]

## 2016-07-04 NOTE — Telephone Encounter (Signed)
It sounds like she needs a renewal for personal care services.  This requires a PCS form that needs to be partially completed by the patient.  It also requires an office visit with PCP in last 90 days.  Patient's PCP is out of office for next several weeks.  Will try to complete for patient but unsure if this will be completed on such short notice.  Please inform patient and have her bring information to office for completion.

## 2016-07-05 ENCOUNTER — Telehealth: Payer: Self-pay | Admitting: Licensed Clinical Social Worker

## 2016-07-05 ENCOUNTER — Encounter: Payer: Self-pay | Admitting: Licensed Clinical Social Worker

## 2016-07-05 NOTE — Progress Notes (Signed)
Social work consult from [facility], Patient called upset that her personal care aide will be  discontinued.  Wants help with getting it back.  LCSW called [facility] to determine what is needed from [facility], spoke to [place], [employer], during patient's annual assessment they determined patient is no longer eligible for PCS.  Patient appealed the decision she will continue to receive services until the appeal is complete. No forms or information is needed from PCP at this time.  LCSW called patient to explain [facility] is unable to do anything during the appeal process.  Patient understands there is a possibility her PCS services may be discontinued after the appeal.  Per patient, her aide now comes at 4:00pm for about an 1hr 45 min.  An RN from [place] comes on Wed to take her vitals and fill her pillbox. Patient is home during the day and is no longer able to go the [facility] for due to missing days.  She enjoys going out to bible study on Wed.   Plan:  1. Patient will call LCSW  if she has questions 2. Patient will come to office visit with PCP March 1st 3. Patient will discuss with PCP during visit in March if she needs a new PCS referral  Casimer Lanius, LCSW Licensed Clinical Social Worker [place]   [phone] 10:42 AM

## 2016-07-05 NOTE — Telephone Encounter (Signed)
Pt was contacted by Casimer Lanius LCSW and this was discussed today so im closing this encounter. Katharina Caper, Lachae Hohler D, [place]

## 2016-07-10 ENCOUNTER — Ambulatory Visit (INDEPENDENT_AMBULATORY_CARE_PROVIDER_SITE_OTHER): Payer: Medicaid Other | Admitting: Allergy and Immunology

## 2016-07-10 ENCOUNTER — Encounter: Payer: Self-pay | Admitting: Allergy and Immunology

## 2016-07-10 VITALS — BP 128/84 | HR 72 | Resp 20

## 2016-07-10 DIAGNOSIS — D899 Disorder involving the immune mechanism, unspecified: Secondary | ICD-10-CM

## 2016-07-10 DIAGNOSIS — J4540 Moderate persistent asthma, uncomplicated: Secondary | ICD-10-CM | POA: Diagnosis not present

## 2016-07-10 DIAGNOSIS — L308 Other specified dermatitis: Secondary | ICD-10-CM

## 2016-07-10 DIAGNOSIS — J3089 Other allergic rhinitis: Secondary | ICD-10-CM

## 2016-07-10 DIAGNOSIS — D849 Immunodeficiency, unspecified: Secondary | ICD-10-CM

## 2016-07-10 DIAGNOSIS — L989 Disorder of the skin and subcutaneous tissue, unspecified: Secondary | ICD-10-CM

## 2016-07-10 NOTE — Patient Instructions (Addendum)
  1. Continue Mometasone 0.1% ointment one-2 times a day if needed   2. Continue Symbicort 160 2 inhalations twice a day  3. Continue montelukast 10 mg one tablet once a day  4. Continue nasal Azelastine 2 sprays each nostril twice a day  5. Continue cetirizine 10 mg one tablet once a day  6. Continue ProAir HFA 2 puffs every 4-6 hours if needed  7. Return to clinic in July 2018 or earlier if problem

## 2016-07-10 NOTE — Progress Notes (Signed)
Follow-up Note  Referring Provider: [name], MD Primary Provider: Kathrine Cords, MD Date of Office Visit: 07/10/2016  Subjective:   [name] (DOB: Nov 26, 1975) is a 41 y.o. female who returns to the [facility] [place] on 07/10/2016 in re-evaluation of the following:  HPI: [name] returns to this clinic in reevaluation of her asthma and allergic rhinoconjunctivitis and history of lower extremity dermatitis. I last saw her in this clinic on 04/03/2016.  Her asthma has really done quite well. She has not required a systemic steroid to treat an exacerbation and rarely uses a short acting bronchodilator and has no problem with exercise. She continues on her Symbicort and montelukast.  Her nose has been doing well and she has not required an antibody to treat an episode of sinusitis. She continues on nasal Azelastine.  Her lower extremity dermatitis has not been active and she's been using her mometasone about twice a month to her legs and feet and occasionally her posterior neck.  She continues on cyclosporine for her liver transplant.  Allergies as of 07/10/2016      Reactions   Tramadol Hcl Itching, Nausea And Vomiting   Causes SEIZURES   Aspirin Nausea And Vomiting   Due to Stomach ulcer   Ibuprofen Other (See Comments)   Due to stomach ulcer   Penicillins Itching, Swelling, Rash   Breakout      Pseudoephedrine Itching, Swelling   Tongue swelling   Sulfa Antibiotics Nausea And Vomiting   Latex Itching, Rash      Medication List      ACCU-CHEK AVIVA PLUS test strip Generic drug:  glucose blood TEST once daily   ACCU-CHEK SOFTCLIX LANCETS lancets Use to check blood sugar once daily in the morning prior to eating.   acetaminophen 325 MG tablet Commonly known as:  TYLENOL Take 650 mg by mouth every 4 (four) hours as needed for mild pain or headache.   azelastine 0.1 % nasal spray Commonly known as:  ASTELIN Place 2 sprays into both nostrils 2  (two) times daily. Use in each nostril as directed   benztropine 0.5 MG tablet Commonly known as:  COGENTIN Take 0.5 mg by mouth at bedtime.   budesonide-formoterol 160-4.5 MCG/ACT inhaler Commonly known as:  SYMBICORT Inhale 2 puffs into the lungs 2 (two) times daily.   Cetirizine HCl 10 MG Caps Take 1 capsule (10 mg total) by mouth daily.   cholecalciferol 1000 units tablet Commonly known as:  VITAMIN D Take 2,000 Units by mouth daily.   RA VITAMIN D-3 2000 units Caps Generic drug:  Cholecalciferol   cycloSPORINE modified 100 MG Caps Take 100-125 mg by mouth 2 (two) times daily. Take 100 mg in the AM and 125 mg in the PM   cycloSPORINE modified 25 MG capsule Commonly known as:  NEORAL Use in combination with 100 mg capsules for PM dose of 125 mg.   divalproex 500 MG 24 hr tablet Commonly known as:  DEPAKOTE ER Take 2 tablets (1,000 mg total) by mouth at bedtime.   doxepin 10 MG capsule Commonly known as:  SINEQUAN Take 10 mg by mouth at bedtime.   LAC-HYDRIN 12 % cream Generic drug:  ammonium lactate Apply topically 2 (two) times daily.   magnesium oxide 400 MG tablet Commonly known as:  MAG-OX Take 400 mg by mouth 2 (two) times daily. 5pm & 7pm.   mometasone 0.1 % cream Commonly known as:  ELOCON Apply 1 application topically 2 (  two) times daily.   montelukast 10 MG tablet Commonly known as:  SINGULAIR Take 1 tablet (10 mg total) by mouth at bedtime.   multivitamin tablet Take 1 tablet by mouth daily. 9 am.   mycophenolate 250 MG capsule Commonly known as:  CELLCEPT Take 500 mg by mouth 2 (two) times daily. 9 am & 9 pm.   omeprazole 40 MG capsule Commonly known as:  PRILOSEC Take 40 mg by mouth daily. 9 am.   polyethylene glycol packet Commonly known as:  MIRALAX / GLYCOLAX Take 17 g by mouth daily as needed (for constipation). In water or juice   prazosin 1 MG capsule Commonly known as:  MINIPRESS Take 2 mg by mouth at bedtime.   PROAIR HFA  108 (90 Base) MCG/ACT inhaler Generic drug:  albuterol Inhale 1 puff into the lungs every 4 (four) hours as needed for shortness of breath.   REFRESH TEARS 0.5 % Soln Generic drug:  carboxymethylcellulose Place 1 drop into both eyes 4 (four) times daily as needed (for dry eyes).   risperiDONE 3 MG tablet Commonly known as:  RISPERDAL Take 3 mg by mouth at bedtime.   VALCYTE PO Take 900 mg by mouth daily.       Past Medical History:  Diagnosis Date  . Abnormal vaginal bleeding   . Allergy   . Anemia   . Arthritis    right knee  . Asthma 09-23-12   Asthma somewhat causing increase wheezing and mild congestion at present  . Biliary cirrhosis ([place]) 04/26/11  . Constipation   . DUB (dysfunctional uterine bleeding)   . GERD (gastroesophageal reflux disease)   . Intellectual disability   . PONV (postoperative nausea and vomiting)   . 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 Seizures ([place])    congenital epilepsy-hx. seizures  . Vitamin D deficiency     Past Surgical History:  Procedure Laterality Date  . adenonoidectomy    . APPENDECTOMY    . CHOLECYSTECTOMY    . COLONOSCOPY W/ BIOPSIES    . ESOPHAGOGASTRODUODENOSCOPY (EGD) WITH PROPOFOL N/A 10/01/2012   Procedure: ESOPHAGOGASTRODUODENOSCOPY (EGD) WITH PROPOFOL;  Surgeon: Arta Silence, MD;  Location: [facility];  Service: Endoscopy;  Laterality: N/A;  . eustacean tubes    . EYE MUSCLE SURGERY    . LIVER TRANSPLANT      Review of systems negative except as noted in HPI / PMHx or noted below:  Review of Systems  Constitutional: Negative.   HENT: Negative.   Eyes: Negative.   Respiratory: Negative.   Cardiovascular: Negative.   Gastrointestinal: Negative.   Genitourinary: Negative.   Musculoskeletal: Negative.   Skin: Negative.   Neurological: Negative.   Endo/Heme/Allergies: Negative.   Psychiatric/Behavioral: Negative.      Objective:   Vitals:   07/10/16 1101  BP: 128/84  Pulse: 72  Resp: 20            Physical Exam  Constitutional: She is well-developed, well-nourished, and in no distress.  HENT:  Head: Normocephalic.  Right Ear: Tympanic membrane, external ear and ear canal normal.  Left Ear: Tympanic membrane, external ear and ear canal normal.  Nose: Nose normal. No mucosal edema or rhinorrhea.  Mouth/Throat: Uvula is midline, oropharynx is clear and moist and mucous membranes are normal. No oropharyngeal exudate.  Eyes: Conjunctivae are normal.  Neck: Trachea normal. No tracheal tenderness present. No tracheal deviation present. No thyromegaly present.  Cardiovascular: Normal rate, regular rhythm, S1 normal, S2 normal and normal heart sounds.  No murmur heard. Pulmonary/Chest: Breath sounds normal. No stridor. No respiratory distress. She has no wheezes. She has no rales.  Musculoskeletal: She exhibits no edema.  Lymphadenopathy:       Head (right side): No tonsillar adenopathy present.       Head (left side): No tonsillar adenopathy present.    She has no cervical adenopathy.  Neurological: She is alert. Gait normal.  Skin: No rash noted. She is not diaphoretic. No erythema. Nails show no clubbing.  Psychiatric: Mood and affect normal.    Diagnostics:    Spirometry was performed and demonstrated an FEV1 of 1.84 at 86 % of predicted.  Assessment and Plan:   1. Moderate persistent asthma, uncomplicated   2. Other allergic rhinitis   3. Inflammatory dermatosis   4. Immunosuppression ([place])     1. Continue Mometasone 0.1% ointment one-2 times a day if needed   2. Continue Symbicort 160 2 inhalations twice a day  3. Continue montelukast 10 mg one tablet once a day  4. Continue nasal Azelastine 2 sprays each nostril twice a day  5. Continue cetirizine 10 mg one tablet once a day  6. Continue ProAir HFA 2 puffs every 4-6 hours if needed  7. Return to clinic in July 2018 or earlier if problem  [place] is doing quite well regarding her multiorgan atopic disease and  she will continue to use anti-inflammatory agents for her upper and lower airways and her skin as noted above. If she continues to do well I will see her back in this clinic in July 2018 or earlier if there is a problem. Obviously she will continue on her cyclosporine for her liver transplant which [facility].  Allena Katz, MD Allergy / Immunology [place]

## 2016-07-26 ENCOUNTER — Ambulatory Visit (INDEPENDENT_AMBULATORY_CARE_PROVIDER_SITE_OTHER): Payer: Medicaid Other | Admitting: Family Medicine

## 2016-07-26 ENCOUNTER — Encounter: Payer: Self-pay | Admitting: Family Medicine

## 2016-07-26 VITALS — BP 110/78 | HR 105 | Temp 98.0°F | Ht 60.0 in | Wt 217.0 lb

## 2016-07-26 DIAGNOSIS — M25561 Pain in right knee: Secondary | ICD-10-CM | POA: Diagnosis not present

## 2016-07-26 DIAGNOSIS — F79 Unspecified intellectual disabilities: Secondary | ICD-10-CM | POA: Diagnosis not present

## 2016-07-26 NOTE — Progress Notes (Signed)
Subjective: [id number] for home health aide HPI: Patient is a 41 y.o. female with a past medical history of cognitive delay, PBC s/p liver transplant, h/o seizures presenting to clinic today to see if she could get her home aid again.  She had her annual [facility] evaluation that determined she no longer needed those services. She states that because she could "heat up water in the microwave," make sandwiches, and get into the shower they did not feel she qualified anymore.   She underwent an appeal process, but that was denied.   She reports she has a difficult time getting in and out of the shower as she has to step up very high to get in and out. She has a shower chair but it just sits in the tub and does not help with transferring her in and out of the tub.  She fell in December trying to get out of the shower (at that she had an aide but she was out for the holiday). She needs an aide to help steady herself getting in and out of the tub shower. She notes she has issues due to her R knee pain (began 2 months ago intermittently) and generalized bilateral leg weakness which is old/stable in addition to issues with balance. She also notes she needs them to help with cooking. She has no issues feeding herself currently. No issues toileting or dressing.   She lives alone and her uncle lives next door. She no longer attends daycare.   Social History: non-smoker   Flu Vaccine: up to date  Tdap Vaccine: up to date  Pneumonia Vaccine: up to date  ROS: All other systems reviewed and are negative besides above and R knee pain: she notes she sometimes has painwhen she goes from sitting to standing position it will lock up. initermittently notes pain with long episodes of standing. Remote history of fracture. She states they told her she had arthritis in that knee last year.   Past Medical History Patient Active Problem List   Diagnosis Date Noted  . Sinusitis, acute 06/16/2015  . Impaired fasting  blood sugar 01/24/2015  . Abnormality of gait 10/15/2014  . Amenorrhea 06/28/2014  . Encounter for cervical Pap smear with pelvic exam 05/12/2014  . Lumbar back pain 04/06/2014  . Health care maintenance 03/06/2014  . H/O liver transplant ([place]) 11/23/2013  . Aftercare following organ transplant 07/28/2013  . 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 Coagulation disorder ([place]) 07/12/2013  . Thrombocytopenia ([place]) 03/09/2013  . Seizure disorder ([place]) 03/01/2013  . Schizophrenia, paranoid type ([place]) 06/25/2012    Class: Chronic  . 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 Vision loss, bilateral 03/30/2012  . 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 Unspecified vitamin D deficiency 09/13/2010  . Rectal bleeding 06/14/2010  . ANEMIA 03/03/2010  . GERD 07/21/2008  . 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 CARDIAC FLOW MURMUR 10/16/2007  . Convulsions/seizures ([place]) 08/23/2006  . Asthma with allergic rhinitis 07/30/2006  . Obesity 07/25/2006  . 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 Mental retardation 07/25/2006  . OSTEOARTHRITIS, LOWER LEG 07/25/2006    Medications- reviewed and updated Current Outpatient Prescriptions  Medication Sig Dispense Refill  . ACCU-CHEK AVIVA PLUS test strip TEST once daily 100 each 1  . ACCU-CHEK SOFTCLIX LANCETS lancets Use to check blood sugar once daily in the morning prior to eating. 100 each 2  . acetaminophen (TYLENOL) 325 MG tablet Take 650 mg by mouth every 4 (four) hours as needed for mild pain  or headache.     Marland Kitchen ammonium lactate (LAC-HYDRIN) 12 % cream Apply topically 2 (two) times daily.     Marland Kitchen azelastine (ASTELIN) 0.1 % nasal spray Place 2 sprays into both nostrils 2 (two) times daily. Use in each nostril as directed 30 mL 3  . benztropine (COGENTIN) 0.5 MG tablet Take 0.5 mg by mouth at bedtime.    . budesonide-formoterol (SYMBICORT) 160-4.5 MCG/ACT inhaler Inhale 2 puffs into the lungs 2 (two) times daily. 1 Inhaler 5  . carboxymethylcellulose (REFRESH TEARS) 0.5 %  SOLN Place 1 drop into both eyes 4 (four) times daily as needed (for dry eyes).    . Cetirizine HCl 10 MG CAPS Take 1 capsule (10 mg total) by mouth daily. 30 capsule 5  . cholecalciferol (VITAMIN D) 1000 UNITS tablet Take 2,000 Units by mouth daily.    . cycloSPORINE modified (NEORAL) 25 MG capsule Use in combination with 100 mg capsules for PM dose of 125 mg.  1  . cycloSPORINE modified 100 MG CAPS Take 100-125 mg by mouth 2 (two) times daily. Take 100 mg in the AM and 125 mg in the PM    . divalproex (DEPAKOTE ER) 500 MG 24 hr tablet Take 2 tablets (1,000 mg total) by mouth at bedtime. 120 tablet 11  . doxepin (SINEQUAN) 10 MG capsule Take 10 mg by mouth at bedtime.    . magnesium oxide (MAG-OX) 400 MG tablet Take 400 mg by mouth 2 (two) times daily. 5pm & 7pm.    . mometasone (ELOCON) 0.1 % cream Apply 1 application topically 2 (two) times daily. 45 g 5  . montelukast (SINGULAIR) 10 MG tablet Take 1 tablet (10 mg total) by mouth at bedtime. 34 tablet 5  . Multiple Vitamin (MULTIVITAMIN) tablet Take 1 tablet by mouth daily. 9 am.    . mycophenolate (CELLCEPT) 250 MG capsule Take 500 mg by mouth 2 (two) times daily. 9 am & 9 pm.    . omeprazole (PRILOSEC) 40 MG capsule Take 40 mg by mouth daily. 9 am.    . polyethylene glycol (MIRALAX / GLYCOLAX) packet Take 17 g by mouth daily as needed (for constipation). In water or juice    . prazosin (MINIPRESS) 1 MG capsule Take 2 mg by mouth at bedtime.     Marland Kitchen PROAIR HFA 108 (90 Base) MCG/ACT inhaler Inhale 1 puff into the lungs every 4 (four) hours as needed for shortness of breath. 1 Inhaler 3  . RA VITAMIN D-3 2000 units CAPS   0  . risperiDONE (RISPERDAL) 3 MG tablet Take 3 mg by mouth at bedtime.    . ValGANciclovir HCl (VALCYTE PO) Take 900 mg by mouth daily.     No current facility-administered medications for this visit.     Objective: Office vital signs reviewed. BP 110/78 (BP Location: Right Arm, Patient Position: Sitting, Cuff Size: Large)    Pulse (!) 105   Temp 98 F (36.7 C)   Ht 5' (1.524 m)   Wt 217 lb (98.4 kg)   LMP 06/24/2016 (Exact Date)   SpO2 99%   BMI 42.38 kg/m    Physical Examination:  General: Awake, alert, well- nourished, NAD, pleasant.  Cardio: RRR, no m/r/g noted.  Pulm: No increased WOB.  CTAB, without wheezes, rhonchi or crackles noted.  Normal to inspection without erythema, ecchymoses, effusion or obvious bony abnormalities.  No obvious Baker's cysts Right knee: Palpation normal with no warmth or joint line tenderness or patellar tenderness or condyle  tenderness.  No TTP along infrapatellar or pes anserine bursas.  ROM normal in flexion (135 degrees) and extension (0 degrees) and lower leg rotation. Ligaments with solid consistent endpoints including ACL, PCL, LCL, MCL.  Negative Anterior Drawer. Negative Mcmurray's.  Patellar and quadriceps tendons unremarkable. Hamstring and quadriceps strength is normal.   Assessment/Plan: OSTEOARTHRITIS, LOWER LEG Exam of R knee unremarkable. No red flags such as swelling, warmth, or erythema.  Pt states she's been intermittently taking Tylenol which her transplant MD stated was fine in moderation. This controls her pain completely.  dicussed weight loss to help with the pain in addition to icing the area.  Return precautions discussed.   Additionally, given pt concerns about getting in and out of her shower, will place order for home health home evaluation. She may benefit from a shower chair that can help with transferring her in and out of the shower. Additionally, may benefit from outpatient PT for balance/strengthening.   Mental retardation Discussed loss of home aide with our CSW, Ms. Laurance Flatten. As the pt does not have issues with feeding herself, only cooking which is an IADL, she does not qualify for PSC. Given her biggest barrier to ADLs is issues with showering, will get an home health evaluation of her home to see if there are any assistive devices like a  chair for transferring or safety bar which would be beneficial.    Orders Placed This Encounter  Procedures  . Ambulatory referral to Home Health    Referral Priority:   Routine    Referral Type:   Home Health Care    Referral Reason:   Specialty Services Required    Requested Specialty:   [place]    Number of Visits Requested:   1    No orders of the defined types were placed in this encounter.   [name] PGY-3, [place]

## 2016-07-26 NOTE — Patient Instructions (Signed)
It was nice to see you again. You can continue to take tylenol intermittently, but not regularly for the knee pain. I have placed an order for a home evaluation to see if we can get you some assistance with getting in and out of your shower.  Follow up with me in 3 months or sooner as needed

## 2016-07-26 NOTE — Assessment & Plan Note (Addendum)
Exam of R knee unremarkable. No red flags such as swelling, warmth, or erythema.  Pt states she's been intermittently taking Tylenol which her transplant MD stated was fine in moderation. This controls her pain completely.  dicussed weight loss to help with the pain in addition to icing the area.  Return precautions discussed.   Additionally, given pt concerns about getting in and out of her shower, will place order for home health home evaluation. She may benefit from a shower chair that can help with transferring her in and out of the shower. Additionally, may benefit from outpatient PT for balance/strengthening.

## 2016-07-26 NOTE — Assessment & Plan Note (Signed)
Discussed loss of home aide with our CSW, Ms. Laurance Flatten. As the pt does not have issues with feeding herself, only cooking which is an IADL, she does not qualify for PSC. Given her biggest barrier to ADLs is issues with showering, will get an home health evaluation of her home to see if there are any assistive devices like a chair for transferring or safety bar which would be beneficial.

## 2016-07-27 ENCOUNTER — Other Ambulatory Visit: Payer: Self-pay | Admitting: Family Medicine

## 2016-07-27 DIAGNOSIS — Z1231 Encounter for screening mammogram for malignant neoplasm of breast: Secondary | ICD-10-CM

## 2016-08-09 ENCOUNTER — Ambulatory Visit: Payer: Self-pay | Admitting: Family Medicine

## 2016-09-03 ENCOUNTER — Ambulatory Visit
Admission: RE | Admit: 2016-09-03 | Discharge: 2016-09-03 | Disposition: A | Discharge status: Home / Self Care | Payer: Medicaid Other | Source: Ambulatory Visit | Attending: Family Medicine | Admitting: Family Medicine

## 2016-09-03 DIAGNOSIS — Z1231 Encounter for screening mammogram for malignant neoplasm of breast: Secondary | ICD-10-CM

## 2016-09-04 ENCOUNTER — Telehealth: Payer: Self-pay | Admitting: Family Medicine

## 2016-09-04 ENCOUNTER — Ambulatory Visit: Payer: Self-pay | Admitting: Family Medicine

## 2016-09-04 NOTE — Telephone Encounter (Signed)
Pt was advised. ep °

## 2016-09-04 NOTE — Telephone Encounter (Signed)
Please ask the pt to make an appt with me so we can fill out her FL-2 form together.  Thanks, [name], MD [facility] Family Medicine Resident  09/04/2016, 8:31 AM

## 2016-09-04 NOTE — Telephone Encounter (Signed)
LMOVM for pt to call us back. Please see message below and schedule her for an appt. Romell Cavanah Kennon Holter, CMA

## 2016-09-06 ENCOUNTER — Encounter: Payer: Self-pay | Admitting: Family Medicine

## 2016-09-06 ENCOUNTER — Ambulatory Visit (INDEPENDENT_AMBULATORY_CARE_PROVIDER_SITE_OTHER): Payer: Medicaid Other | Admitting: Family Medicine

## 2016-09-06 VITALS — Ht 60.0 in | Wt 217.8 lb

## 2016-09-06 DIAGNOSIS — E6609 Other obesity due to excess calories: Secondary | ICD-10-CM | POA: Diagnosis present

## 2016-09-06 DIAGNOSIS — Z6841 Body Mass Index (BMI) 40.0 and over, adult: Secondary | ICD-10-CM

## 2016-09-06 DIAGNOSIS — IMO0001 Reserved for inherently not codable concepts without codable children: Secondary | ICD-10-CM

## 2016-09-06 NOTE — Patient Instructions (Addendum)
Goals until we see each other again: 1. Eat 4 servings of vegetables per day. 2. Eat protein with every breakfast (eggs, egg beaters, canadian bacon, on an english muffin). If you do scrambled eggs you can add in vegetables like green peppers. 3. If you have grit for breakfast, add a protein like low fat milk, string cheese, cottage cheese, or fruit like honey dew, pears, and pineapple- try to get it fresh (best option) or packed in juice instead of syrup.   4. Walk around the house and down the street once daily.    Schedule your appointment on May 24th from 9-10am with Dr. Jenne Campus at the front desk.

## 2016-09-06 NOTE — Progress Notes (Signed)
Nutrition Clinic  Patient is presenting for a follow up on obesity.  Patient recently lost her nurse aid as she showed she was able to shower independently and and use a microwave. She intermittently uses an oven. Her uncle continues to buy things for her. She started trying to follow [name]'s list of healthy 100 calorie snacks. She does bring in snack sized servings of Pringles and VegeStraws that she's been eating. She's going to try the ham roll ups soon.    24-hr recall:  (Up at 5 AM) B (6 AM)-   1 pack of grits, diet tea- 16oz Snk ( 12 PM)-  1oz Vege straws  L (1 PM)-  Burrito- 1 slices roast beef, 1 Tbsp sour cream, 1tsp salsa, flour tortilla, 16oz diet tea Snk ( 2 PM)-    String cheese, 5 saltine crackers D (4 PM)-  Plate full of popcorn shrimp, green peppers- 1, 1 cucumber, 16oz lemonade  Snk (8 PM)-  0.74oz Pringles Typical day? Yes.    Recent physical activity includes: not getting too much done b/c she can't use her exercise video, doesn't have VCR hooked up but got a new TV. Walks in her home and down the street to aunts home which is a 15 minute walk once per week.   Previous Goal(s):  1. Eat 1-3 snacks a day that come from list provided by [name]- met everyday 2. Obtain twice as many veg's as protein or carbohydrate foods for both lunch and dinner.- pt states she met this sometimes 3. No more than 2 glasses of lemonade or juice per day. (Only use one cup of sugar in your pitcher of lemonade.) - uses 4 packets with Equal with each glass; meeting goal.               - Drinking 3-6 16oz waters per day   Current Goal(s): 1. Eat 4 servings of vegetables per day. 2. Eat protein with every breakfast (eggs, egg beaters, canadian bacon, on an english muffin). If you do scrambled eggs you can add in vegetables like green peppers. 3. If you have grit for breakfast, add a protein like low fat milk, string cheese, cottage cheese, or fruit like honey dew, pears, and pineapple- try  to get it fresh (best option) or packed in juice instead of syrup.   4. Walk around the house and down the street once daily.   Handouts given during visit include:  AVS  Goals handout   I spent more than 50% of this 45 minute visit in direct counseling   Demonstrated degree of understanding via:  Teach Back   Kathrine Cords, MD [place], PGY-3

## 2016-09-10 ENCOUNTER — Ambulatory Visit (INDEPENDENT_AMBULATORY_CARE_PROVIDER_SITE_OTHER): Payer: Medicaid Other | Admitting: Family Medicine

## 2016-09-10 ENCOUNTER — Encounter: Payer: Self-pay | Admitting: Family Medicine

## 2016-09-10 DIAGNOSIS — F200 Paranoid schizophrenia: Secondary | ICD-10-CM

## 2016-09-10 DIAGNOSIS — F79 Unspecified intellectual disabilities: Secondary | ICD-10-CM

## 2016-09-10 DIAGNOSIS — G40909 Epilepsy, unspecified, not intractable, without status epilepticus: Secondary | ICD-10-CM

## 2016-09-10 NOTE — Assessment & Plan Note (Addendum)
Unfortunately, unable to get a home PT evaluation of pt's home for something like a shower rail. No contractures noted on my exam.  - would benefit from regular check ins from an aid. - pt able to use a phone, can take [name] transportation however would get lost if she tried to use public transportation.  - FL2 completed, faxed over and 1 copy placed in scan box.

## 2016-09-10 NOTE — Patient Instructions (Signed)
I think we have enough to get you a home aid, I will send in the request again today and see what they say.

## 2016-09-10 NOTE — Assessment & Plan Note (Signed)
Stable on current regimen. f/u with neurology.

## 2016-09-10 NOTE — Assessment & Plan Note (Addendum)
No inpatient psych admissions since 2015. Stable currently. - would benefit from regular check ins from an aid
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
## 2016-09-13 ENCOUNTER — Telehealth: Payer: Self-pay | Admitting: Family Medicine

## 2016-09-13 NOTE — Telephone Encounter (Signed)
Uncle has POA.  He needs a medical history for the inhome assessment her social worker needs.  He would like to talk to dr dorsey  [phone]

## 2016-09-14 NOTE — Telephone Encounter (Signed)
I completed the domiciliary level on the form yesterday and had it faxed. If there are more issues, please have them re-fax it with any additional information that is needed.  I attempted to call and let Sonia Side know, went to voicemail. Left a HIPAA compliant message. Please relay information if he calls.  Thanks, [name], MD [facility] Family Medicine Resident  09/14/2016, 12:15 PM

## 2016-09-14 NOTE — Telephone Encounter (Signed)
2nd request. Uncle states Domiciliary level of care was not filled out on FL2, it is #11. Please call Sonia Side [phone] ep

## 2016-09-14 NOTE — Telephone Encounter (Signed)
Uncle was advised. ep

## 2016-09-17 NOTE — Telephone Encounter (Signed)
Pt's Uncle called back and is still having issues with the FL2 and needs Dr. Lorenso Courier to call him back [phone] ep

## 2016-09-18 ENCOUNTER — Telehealth: Payer: Self-pay | Admitting: Family Medicine

## 2016-09-18 NOTE — Telephone Encounter (Signed)
Patient's uncle [name] came to office stated pcp needs to fill out # 11 on the FL2 form Domiciliary level of care. pcp has form now, please fax once complete to [phone]. Uncle spoke with Education officer, museum and was told this was not complete as of yet.  Please let uncle know when complete. [phone], this is needed by 09/20/16 or services will be canceled. This allows her to stay at home

## 2016-09-19 ENCOUNTER — Telehealth: Payer: Self-pay | Admitting: Family Medicine

## 2016-09-19 NOTE — Telephone Encounter (Signed)
Discussed with Sonia Side. I still do not have the form in my box. He will touch base with her social worker to ensure she faxes over.  [name], MD [facility] Family Medicine Resident  09/19/2016, 1:44 PM

## 2016-09-19 NOTE — Telephone Encounter (Signed)
I will be at clinic around 1pm and will complete #11 and have it faxed over again.  [name], MD [facility] Family Medicine Resident  09/19/2016, 8:02 AM

## 2016-09-19 NOTE — Telephone Encounter (Signed)
Social Services sent over and [facility] today (its in PCP's box) and it needs to be sent back by Monday. ep

## 2016-09-20 NOTE — Telephone Encounter (Signed)
Completed form, placed in box to be faxed.  [name], MD [facility] Family Medicine Resident  09/20/2016, 8:29 AM

## 2016-10-08 ENCOUNTER — Telehealth: Payer: Self-pay | Admitting: Family Medicine

## 2016-10-08 NOTE — Telephone Encounter (Signed)
Pt is calling and would like the doctor to write a letter stating that she can use her inhaler at adult daycare. She would like [place] to mail this to her. Her address in Epic is correct. jw

## 2016-10-10 NOTE — Telephone Encounter (Signed)
Note completed in Epic and sent to [facility] for mailing.  CSD

## 2016-10-18 ENCOUNTER — Telehealth: Payer: Self-pay | Admitting: Family Medicine

## 2016-10-18 ENCOUNTER — Ambulatory Visit: Payer: Self-pay | Admitting: Family Medicine

## 2016-10-18 DIAGNOSIS — L602 Onychogryphosis: Secondary | ICD-10-CM

## 2016-10-18 NOTE — Telephone Encounter (Signed)
Pt needs someone to cut her toenails. Pt needs a referral for a podiatrist. ep

## 2016-10-22 NOTE — Telephone Encounter (Signed)
Referral placed.

## 2016-10-30 ENCOUNTER — Ambulatory Visit (HOSPITAL_COMMUNITY)
Admission: RE | Admit: 2016-10-30 | Discharge: 2016-10-30 | Disposition: A | Discharge status: Home / Self Care | Payer: Medicaid Other | Source: Ambulatory Visit | Attending: Family Medicine | Admitting: Family Medicine

## 2016-10-30 ENCOUNTER — Encounter: Payer: Self-pay | Admitting: Family Medicine

## 2016-10-30 ENCOUNTER — Ambulatory Visit (INDEPENDENT_AMBULATORY_CARE_PROVIDER_SITE_OTHER): Payer: Medicaid Other | Admitting: Family Medicine

## 2016-10-30 ENCOUNTER — Telehealth: Payer: Self-pay | Admitting: Family Medicine

## 2016-10-30 VITALS — BP 100/70 | HR 103 | Temp 97.9°F | Ht 60.0 in | Wt 211.0 lb

## 2016-10-30 DIAGNOSIS — R079 Chest pain, unspecified: Secondary | ICD-10-CM | POA: Insufficient documentation

## 2016-10-30 NOTE — Progress Notes (Signed)
Subjective: CC: [employer] paperwork HPI: Patient is a 41 y.o. female with a past medical history of ast medical history of cognitive delay, PBC s/p liver transplant, schizophrenia  h/o seizures presenting to clinic today for [employer] paper work.  The patient has been using [employer] transportation. She states that she is unable to navigate the bus schedule. Additionally, the bus stop is not near her home therefore it requires her to walk further than she feels she can. She does not go out of the home by herself. Her uncle does all the grocery shopping. The church [employer] picks her up and takes her to church regularly. She continues to endorse some instability when trying to navigate uneven terrain. She feels she could probably walk 5 blocks prior to stopping.  Prior to the end of the visit, the patient states she had chest pain on Wednesday. Wednesday morning the patient was in bed at 9am, woke up to get ready for her home health nurse , then had centrally located chest pain. Pain did not radiate.  Stated it was sharp in nature, lasted 1 hour.  She took Ibuprofen and pain eventually resolved. She also endorses SOB that started Tuesday when she gets hot.  Still she had some mild fullness of breath with this event. No numbness or tingling of hands.  No diaphoresis.  No more chest pain since that episode.  She has a h/o GERD, states this did not feel like reflux.  She told her home health nurse who visited that morning who stated she should tell her PCP.  No h/o cardiac abnormalities besides a flow murmur. Grandmother had stroke.  Mother had heart failure  No family history of MI.  Social History: Nonsmoker   ROS: All other systems reviewed and are negative.  Past Medical History Patient Active Problem List   Diagnosis Date Noted  . Sinusitis, acute 06/16/2015  . Impaired fasting blood sugar 01/24/2015  . Abnormality of gait 10/15/2014  . Amenorrhea 06/28/2014  . Encounter for cervical Pap smear  with pelvic exam 05/12/2014  . Lumbar back pain 04/06/2014  . Health care maintenance 03/06/2014  . H/O liver transplant ([place]) 11/23/2013  . Aftercare following organ transplant 07/28/2013  . 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 Coagulation disorder ([place]) 07/12/2013  . Thrombocytopenia ([place]) 03/09/2013  . Seizure disorder ([place]) 03/01/2013  . Schizophrenia, paranoid type ([place]) 06/25/2012    Class: Chronic  . 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 Vision loss, bilateral 03/30/2012  . Chest pain 01/03/2012  . 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 Unspecified vitamin D deficiency 09/13/2010  . Rectal bleeding 06/14/2010  . ANEMIA 03/03/2010  . GERD 07/21/2008  . 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 CARDIAC FLOW MURMUR 10/16/2007  . Convulsions/seizures ([place]) 08/23/2006  . Asthma with allergic rhinitis 07/30/2006  . Obesity 07/25/2006  . 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 Mental retardation 07/25/2006  . OSTEOARTHRITIS, LOWER LEG 07/25/2006    Medications- reviewed and updated Current Outpatient Prescriptions  Medication Sig Dispense Refill  . ACCU-CHEK AVIVA PLUS test strip TEST once daily 100 each 1  . ACCU-CHEK SOFTCLIX LANCETS lancets Use to check blood sugar once daily in the morning prior to eating. 100 each 2  . acetaminophen (TYLENOL) 325 MG tablet Take 650 mg by mouth every 4 (four) hours as needed for mild pain or headache.     Marland Kitchen ammonium lactate (LAC-HYDRIN) 12 % cream Apply topically 2 (two) times daily.     Marland Kitchen azelastine (ASTELIN) 0.1 %  nasal spray Place 2 sprays into both nostrils 2 (two) times daily. Use in each nostril as directed 30 mL 3  . benztropine (COGENTIN) 0.5 MG tablet Take 0.5 mg by mouth at bedtime.    . budesonide-formoterol (SYMBICORT) 160-4.5 MCG/ACT inhaler Inhale 2 puffs into the lungs 2 (two) times daily. 1 Inhaler 5  . carboxymethylcellulose (REFRESH TEARS) 0.5 % SOLN Place 1 drop into both eyes 4 (four) times daily as needed (for dry eyes).    .  Cetirizine HCl 10 MG CAPS Take 1 capsule (10 mg total) by mouth daily. 30 capsule 5  . cholecalciferol (VITAMIN D) 1000 UNITS tablet Take 2,000 Units by mouth daily.    . cycloSPORINE modified (NEORAL) 25 MG capsule Use in combination with 100 mg capsules for PM dose of 125 mg.  1  . cycloSPORINE modified 100 MG CAPS Take 100-125 mg by mouth 2 (two) times daily. Take 100 mg in the AM and 125 mg in the PM    . divalproex (DEPAKOTE ER) 500 MG 24 hr tablet Take 2 tablets (1,000 mg total) by mouth at bedtime. 120 tablet 11  . doxepin (SINEQUAN) 10 MG capsule Take 10 mg by mouth at bedtime.    . magnesium oxide (MAG-OX) 400 MG tablet Take 400 mg by mouth 2 (two) times daily. 5pm & 7pm.    . mometasone (ELOCON) 0.1 % cream Apply 1 application topically 2 (two) times daily. 45 g 5  . montelukast (SINGULAIR) 10 MG tablet Take 1 tablet (10 mg total) by mouth at bedtime. 34 tablet 5  . Multiple Vitamin (MULTIVITAMIN) tablet Take 1 tablet by mouth daily. 9 am.    . mycophenolate (CELLCEPT) 250 MG capsule Take 500 mg by mouth 2 (two) times daily. 9 am & 9 pm.    . omeprazole (PRILOSEC) 40 MG capsule Take 40 mg by mouth daily. 9 am.    . polyethylene glycol (MIRALAX / GLYCOLAX) packet Take 17 g by mouth daily as needed (for constipation). In water or juice    . prazosin (MINIPRESS) 1 MG capsule Take 2 mg by mouth at bedtime.     Marland Kitchen PROAIR HFA 108 (90 Base) MCG/ACT inhaler Inhale 1 puff into the lungs every 4 (four) hours as needed for shortness of breath. 1 Inhaler 3  . RA VITAMIN D-3 2000 units CAPS   0  . risperiDONE (RISPERDAL) 3 MG tablet Take 3 mg by mouth at bedtime.    . ValGANciclovir HCl (VALCYTE PO) Take 900 mg by mouth daily.     No current facility-administered medications for this visit.     Objective: Office vital signs reviewed. BP 100/70 (BP Location: Left Arm, Patient Position: Sitting, Cuff Size: Large)   Pulse (!) 103   Temp 97.9 F (36.6 C) (Oral)   Ht 5' (1.524 m)   Wt 211 lb  (95.7 kg)   LMP 10/14/2016 (Approximate)   SpO2 98%   BMI 41.21 kg/m    Physical Examination:  General: Awake, alert,  Well- nourished, NAD ENMT:  TMs intact, normal light reflex, no erythema, no bulging. Nasal turbinates moist. MMM, Oropharynx clear without erythema or tonsillar exudate/hypertrophy Eyes: Conjunctiva non-injected.  Cardio: RRR, no m/r/g noted. Pain not reproducible with palpation over the anterior chest wall.  Pulm: No increased WOB.  CTAB, without wheezes, rhonchi or crackles noted.  Extremities: No edema.  MSK: Normal gait and station Skin: dry, intact, no rashes or lesions  EKG: Normal sinus rhythm with a heart rate  of 77. Inverted T waves in 3 and aVR stable from previous EKG. Mild diffuse T-wave flattening in all leads. QTC 459.  Assessment/Plan: Chest pain EKG stable. Patient without chest pain since Wednesday. Given improvement in symptoms, PE very unlikely. Patient denies this feels like previous reflux. Given patient's risk factors including hyperlipidemia and autoimmune disorder, I feel she would benefit from further cardiology workup. Previously had elevated LDL to 129, TG to 233 and HDL to 35, however not a candidate for statins due to h/o liver transplant - risk stratification labs  - referral to cardiology  - discussed strict reasons to go to the ED  Completed [employer] paperwork   Orders Placed This Encounter  Procedures  . CBC with Differential/Platelet  . TSH  . Lipid panel  . Ambulatory referral to Cardiology    Referral Priority:   Routine    Referral Type:   Consultation    Referral Reason:   Specialty Services Required    Requested Specialty:   Cardiology    Number of Visits Requested:   1  . EKG 12-Lead    No orders of the defined types were placed in this encounter.   [name] PGY-3, [place]

## 2016-10-30 NOTE — Assessment & Plan Note (Addendum)
EKG stable. Patient without chest pain since Wednesday. Given improvement in symptoms, PE very unlikely. Patient denies this feels like previous reflux. Given patient's risk factors including hyperlipidemia and autoimmune disorder, I feel she would benefit from further cardiology workup. Previously had elevated LDL to 129, TG to 233 and HDL to 35, however not a candidate for statins due to h/o liver transplant - risk stratification labs  - referral to cardiology  - discussed strict reasons to go to the ED

## 2016-10-30 NOTE — Patient Instructions (Signed)
It was nice to see you today. Your EKG looks similar to previous EKGs.  Because of the chest pain that you had previously and your risk factors: autoimmune disorder and increased cholesterol, I would like for you to be evaluated by cardiology.  I have placed this referral.  I will also get lab work, I will call you if it is NOT normal, otherwise I will send you a letter in the mail.  Follow up if you have additional symptoms.    My last month at the clinic will be June. If I do not see you before I leave the practice, it was a pleasure and honor to care for you. Please follow up with your new PCP in 2-3 months    Nonspecific Chest Pain Chest pain can be caused by many different conditions. There is always a chance that your pain could be related to something serious, such as a heart attack or a blood clot in your lungs. Chest pain can also be caused by conditions that are not life-threatening. If you have chest pain, it is very important to follow up with your health care provider. What are the causes? Causes of this condition include:  Heartburn.  Pneumonia or bronchitis.  Anxiety or stress.  Inflammation around your heart (pericarditis) or lung (pleuritis or pleurisy).  A blood clot in your lung.  A collapsed lung (pneumothorax). This can develop suddenly on its own (spontaneous pneumothorax) or from trauma to the chest.  Shingles infection (varicella-zoster virus).  Heart attack.  Damage to the bones, muscles, and cartilage that make up your chest wall. This can include: ? Bruised bones due to injury. ? Strained muscles or cartilage due to frequent or repeated coughing or overwork. ? Fracture to one or more ribs. ? Sore cartilage due to inflammation (costochondritis).  What increases the risk? Risk factors for this condition may include:  Activities that increase your risk for trauma or injury to your chest.  Respiratory infections or conditions that cause frequent  coughing.  Medical conditions or overeating that can cause heartburn.  Heart disease or family history of heart disease.  Conditions or health behaviors that increase your risk of developing a blood clot.  Having had chicken pox (varicella zoster).  What are the signs or symptoms? Chest pain can feel like:  Burning or tingling on the surface of your chest or deep in your chest.  Crushing, pressure, aching, or squeezing pain.  Dull or sharp pain that is worse when you move, cough, or take a deep breath.  Pain that is also felt in your back, neck, shoulder, or arm, or pain that spreads to any of these areas.  Your chest pain may come and go, or it may stay constant. How is this diagnosed? Lab tests or other studies may be needed to find the cause of your pain. Your health care provider may have you take a test called an ECG (electrocardiogram). An ECG records your heartbeat patterns at the time the test is performed. You may also have other tests, such as:  Transthoracic echocardiogram (TTE). In this test, sound waves are used to create a picture of the heart structures and to look at how blood flows through your heart.  Transesophageal echocardiogram (TEE).This is a more advanced imaging test that takes images from inside your body. It allows your health care provider to see your heart in finer detail.  Cardiac monitoring. This allows your health care provider to monitor your heart rate and rhythm  in real time.  Holter monitor. This is a portable device that records your heartbeat and can help to diagnose abnormal heartbeats. It allows your health care provider to track your heart activity for several days, if needed.  Stress tests. These can be done through exercise or by taking medicine that makes your heart beat more quickly.  Blood tests.  Other imaging tests.  How is this treated? Treatment depends on what is causing your chest pain. Treatment may include:  Medicines.  These may include: ? Acid blockers for heartburn. ? Anti-inflammatory medicine. ? Pain medicine for inflammatory conditions. ? Antibiotic medicine, if an infection is present. ? Medicines to dissolve blood clots. ? Medicines to treat coronary artery disease (CAD).  Supportive care for conditions that do not require medicines. This may include: ? Resting. ? Applying heat or cold packs to injured areas. ? Limiting activities until pain decreases.  Follow these instructions at home: Medicines  If you were prescribed an antibiotic, take it as told by your health care provider. Do not stop taking the antibiotic even if you start to feel better.  Take over-the-counter and prescription medicines only as told by your health care provider. Lifestyle  Do not use any products that contain nicotine or tobacco, such as cigarettes and e-cigarettes. If you need help quitting, ask your health care provider.  Do not drink alcohol.  Make lifestyle changes as directed by your health care provider. These may include: ? Getting regular exercise. Ask your health care provider to suggest some activities that are safe for you. ? Eating a heart-healthy diet. A registered dietitian can help you to learn healthy eating options. ? Maintaining a healthy weight. ? Managing diabetes, if necessary. ? Reducing stress, such as with yoga or relaxation techniques. General instructions  Avoid any activities that bring on chest pain.  If heartburn is the cause for your chest pain, raise (elevate) the head of your bed about 6 inches (15 cm) by putting blocks under the legs. Sleeping with more pillows does not effectively relieve heartburn because it only changes the position of your head.  Keep all follow-up visits as told by your health care provider. This is important. This includes any further testing if your chest pain does not go away. Contact a health care provider if:  Your chest pain does not go  away.  You have a rash with blisters on your chest.  You have a fever.  You have chills. Get help right away if:  Your chest pain is worse.  You have a cough that gets worse, or you cough up blood.  You have severe pain in your abdomen.  You have severe weakness.  You faint.  You have sudden, unexplained chest discomfort.  You have sudden, unexplained discomfort in your arms, back, neck, or jaw.  You have shortness of breath at any time.  You suddenly start to sweat, or your skin gets clammy.  You feel nauseous or you vomit.  You suddenly feel light-headed or dizzy.  Your heart begins to beat quickly, or it feels like it is skipping beats. These symptoms may represent a serious problem that is an emergency. Do not wait to see if the symptoms will go away. Get medical help right away. Call your local emergency services (911 in the [place] Do not drive yourself to the hospital. This information is not intended to replace advice given to you by your health care provider. Make sure you discuss any questions you have with  your health care provider. Document Released: 02/21/2005 Document Revised: 02/06/2016 Document Reviewed: 02/06/2016 Elsevier Interactive Patient Education  2017 [employer].

## 2016-10-30 NOTE — Telephone Encounter (Signed)
Pt states she is coming by today with a form that she needs to have filled out today. ep

## 2016-10-30 NOTE — Telephone Encounter (Signed)
Addressed at office visit today

## 2016-10-31 LAB — LIPID PANEL
CHOL/HDL RATIO: 4.1 ratio (ref 0.0–4.4)
Cholesterol, Total: 214 mg/dL — ABNORMAL HIGH (ref 100–199)
HDL: 52 mg/dL (ref >39)
LDL CALC: 131 mg/dL — AB (ref 0–99)
Triglycerides: 153 mg/dL — ABNORMAL HIGH (ref 0–149)
VLDL CHOLESTEROL CAL: 31 mg/dL (ref 5–40)

## 2016-10-31 LAB — CBC WITH DIFFERENTIAL/PLATELET
BASOS: 0 %
Basophils Absolute: 0 10*3/uL (ref 0.0–0.2)
EOS (ABSOLUTE): 0.2 10*3/uL (ref 0.0–0.4)
EOS: 7 %
HEMATOCRIT: 29.8 % — AB (ref 34.0–46.6)
HEMOGLOBIN: 10.1 g/dL — AB (ref 11.1–15.9)
IMMATURE GRANS (ABS): 0 10*3/uL (ref 0.0–0.1)
IMMATURE GRANULOCYTES: 0 %
LYMPHS: 51 %
Lymphocytes Absolute: 1.6 10*3/uL (ref 0.7–3.1)
MCH: 27.1 pg (ref 26.6–33.0)
MCHC: 33.9 g/dL (ref 31.5–35.7)
MCV: 80 fL (ref 79–97)
Monocytes Absolute: 0.5 10*3/uL (ref 0.1–0.9)
Monocytes: 14 %
NEUTROS ABS: 0.9 10*3/uL — AB (ref 1.4–7.0)
Neutrophils: 28 %
Platelets: 130 10*3/uL — ABNORMAL LOW (ref 150–379)
RBC: 3.73 x10E6/uL — ABNORMAL LOW (ref 3.77–5.28)
RDW: 15.9 % — AB (ref 12.3–15.4)
WBC: 3.2 10*3/uL — ABNORMAL LOW (ref 3.4–10.8)

## 2016-10-31 LAB — TSH: TSH: 5.87 u[IU]/mL — ABNORMAL HIGH (ref 0.450–4.500)

## 2016-11-01 ENCOUNTER — Ambulatory Visit (INDEPENDENT_AMBULATORY_CARE_PROVIDER_SITE_OTHER): Payer: Medicaid Other | Admitting: Family Medicine

## 2016-11-01 ENCOUNTER — Other Ambulatory Visit: Payer: Self-pay | Admitting: Family Medicine

## 2016-11-01 ENCOUNTER — Telehealth: Payer: Self-pay | Admitting: Family Medicine

## 2016-11-01 VITALS — Ht 60.0 in | Wt 208.4 lb

## 2016-11-01 DIAGNOSIS — IMO0001 Reserved for inherently not codable concepts without codable children: Secondary | ICD-10-CM

## 2016-11-01 DIAGNOSIS — Z6841 Body Mass Index (BMI) 40.0 and over, adult: Secondary | ICD-10-CM

## 2016-11-01 DIAGNOSIS — E669 Obesity, unspecified: Secondary | ICD-10-CM

## 2016-11-01 DIAGNOSIS — R7989 Other specified abnormal findings of blood chemistry: Secondary | ICD-10-CM

## 2016-11-01 NOTE — Progress Notes (Signed)
Nutrition Clinic  Patient is presenting for a follow up on obesity.  Patient brings in goal sheet today.  She is writing down how many times daily she gets a serving of protein with breakfast (1-4 times daily, usually 1-2), servings of vegetables daily (1-4 times daily, usually only 1-2), and walking daily (1-4 times, at least once daily).  She has started back going to daycare two days weekly (Mon and Fri).  Is getting a glass of milk with breakfast   24-hr recall:  (Up at 4 AM, out of bed at 6 am) B (10:30 AM)-     1c egg white (egg beater), granola bar, diet tea -8oz L (1 PM)-  2 hot dogs with buns plain, 2 pickling cucumbers, 1c pineapple (canned in juice), diet tea 8oz D (4 PM)-  2 pieces of frozen flounder breaded and baked, 1 tomato, 2 pickling cucumbers in vinegar, 16 oz diet green tea, water Snk (7 PM)-  2 tbsp cream cheese, 15 pretzels, bottle of water Typical day? Yes.   Though usually eats breakfast earlier  Recent physical activity includes: walking to and from aunt's house (down the street) 1-4 times daily, doing chair exercises at daycare 2x/wk  Previous Goal(s):  1. Eat 4 servings of vegetables per day. 2. Eat protein with every breakfast (eggs, egg beaters, canadian bacon, on an english muffin). If you do scrambled eggs you can add in vegetables like green peppers. 3. If you have grit for breakfast, add a protein like low fat milk, string cheese, cottage cheese, or fruit like honey dew, pears, and pineapple- try to get it fresh (best option) or packed in juice instead of syrup.   4. Walk around the house and down the street once daily.   Current Goal(s): 1. Eat 4 servings of vegetables per day. 2. Eat protein with every breakfast (eggs, egg beaters, canadian bacon, on an english muffin). If you do scrambled eggs you can add in vegetables like green peppers. 3. If you have grit for breakfast, add a protein like low fat milk, string cheese, cottage cheese, or fruit like  honey dew, pears, and pineapple- try to get it fresh (best option) or packed in juice instead of syrup.   4. Walk around the house and down the street twice daily.   Handouts given during visit include:  AVS  Goals handout   I spent more than 50% of this 30 minute visit in direct counseling   Demonstrated degree of understanding via:  Teach Back    Bacigalupo, Dionne Bucy, MD, MPH PGY-3,  [facility] 11/01/2016 9:02 AM

## 2016-11-01 NOTE — Patient Instructions (Addendum)
Follow up at 9am (60 min appt) on July 26th.    Continue to log your goals daily and bring to follow-up.  Current Goal(s): 1. Eat 4 servings of vegetables per day. 2. Eat protein with every breakfast (eggs, egg beaters, canadian bacon, on an english muffin). If you do scrambled eggs you can add in vegetables like green peppers. 3. If you have grit for breakfast, add a protein like low fat milk, string cheese, cottage cheese, or fruit like honey dew, pears, and pineapple- try to get it fresh (best option) or packed in juice instead of syrup.   4. Walk around the house and down the street twice daily.

## 2016-11-01 NOTE — Addendum Note (Signed)
Addended by: Schuyler Amor on: 11/01/2016 09:21 AM   Modules accepted: Orders

## 2016-11-01 NOTE — Telephone Encounter (Signed)
TSH noted to be elevated. Will add on T4/T3.  [name], MD [facility] Family Medicine Resident  11/01/2016, 8:29 AM

## 2016-11-02 ENCOUNTER — Telehealth: Payer: Self-pay | Admitting: Family Medicine

## 2016-11-02 LAB — SPECIMEN STATUS REPORT

## 2016-11-02 LAB — T3, FREE: T3 FREE: 2.7 pg/mL (ref 2.0–4.4)

## 2016-11-02 LAB — T4, FREE: FREE T4: 1.42 ng/dL (ref 0.82–1.77)

## 2016-11-02 NOTE — Telephone Encounter (Signed)
Please relay information to patient.  Thanks, [name], MD [facility] Family Medicine Resident  11/02/2016, 10:27 AM

## 2016-11-02 NOTE — Telephone Encounter (Signed)
Pt called because the office we referred to so she get get her toe nail's clip charge over 100.00 and does not accept Medicaid. She would like to get referred to a place that would accept Medicaid. jw

## 2016-11-02 NOTE — Telephone Encounter (Signed)
[facility] does accept Medicaid. Per referral note from [facility], the problem is that Medicaid does not pay to have toenails trimmed. This is why patient was not scheduled. This will be the same at any podiatry clinic we refer her to.

## 2016-11-06 ENCOUNTER — Encounter: Payer: Self-pay | Admitting: Family Medicine

## 2016-11-06 ENCOUNTER — Telehealth: Payer: Self-pay | Admitting: Family Medicine

## 2016-11-06 NOTE — Telephone Encounter (Signed)
Advised patient that Medicaid does not cover toenail clippings per note in chart.

## 2016-11-20 ENCOUNTER — Telehealth: Payer: Self-pay | Admitting: Family Medicine

## 2016-11-20 NOTE — Telephone Encounter (Signed)
Would like lab results for June 5 visit

## 2016-11-21 NOTE — Telephone Encounter (Signed)
Called patient. Letter with results and explanation sent on 6/11, however pt states she has not received it yet.  Went over results with pt. She should be getting and a copy of the results in the mail soon (I verified her address). Pt voiced understanding and appreciation of the call.  [name], MD [facility] Family Medicine Resident  11/21/2016, 9:16 AM

## 2016-11-27 ENCOUNTER — Encounter: Payer: Self-pay | Admitting: Allergy and Immunology

## 2016-11-27 ENCOUNTER — Ambulatory Visit (INDEPENDENT_AMBULATORY_CARE_PROVIDER_SITE_OTHER): Payer: Medicaid Other | Admitting: Allergy and Immunology

## 2016-11-27 VITALS — BP 108/70 | HR 110 | Resp 18 | Ht 61.0 in | Wt 219.0 lb

## 2016-11-27 DIAGNOSIS — J4540 Moderate persistent asthma, uncomplicated: Secondary | ICD-10-CM | POA: Diagnosis not present

## 2016-11-27 DIAGNOSIS — L308 Other specified dermatitis: Secondary | ICD-10-CM | POA: Diagnosis not present

## 2016-11-27 DIAGNOSIS — J3089 Other allergic rhinitis: Secondary | ICD-10-CM

## 2016-11-27 DIAGNOSIS — L989 Disorder of the skin and subcutaneous tissue, unspecified: Secondary | ICD-10-CM

## 2016-11-27 DIAGNOSIS — D849 Immunodeficiency, unspecified: Secondary | ICD-10-CM

## 2016-11-27 DIAGNOSIS — D899 Disorder involving the immune mechanism, unspecified: Secondary | ICD-10-CM

## 2016-11-27 NOTE — Progress Notes (Signed)
Follow-up Note  Referring Provider: [name], MD Primary Provider: Lovenia Kim, MD Date of Office Visit: 11/27/2016  Subjective:   [name] (DOB: [birth year]) is a 41 y.o. female who returns to the [facility] on 11/27/2016 in re-evaluation of the following:  HPI: [name] returns to this clinic in reevaluation of her asthma and allergic rhinoconjunctivitis and inflammatory dermatosis. Her last visit to this clinic was February 2018.  Asthma has been under excellent control. She has not required a systemic steroids for an exacerbation. Her requirement for a short acting bronchodilator has increased since the heat has arrived but it still averages out to approximately 2 times per week. She does not really exert herself to any large degree and thus there does not really appear to be a significant exercise induced component with her asthma.  Her nose is doing quite well. She has not required an antibiotic to treat any type of sinus infection.  Her hands have become a little bit thickened on the palm. She is not using her topical mometasone.  Imelda has had 2 episodes of substernal chest pain without any associated shortness of breath or fast heart rate or nausea that each lasted less than a half a day and both occurred in June unrelated to exertion. She has an appointment to see a cardiologist next week. She does have reflux disease that she thinks is under pretty good control while using omeprazole. She does drink green tea throughout the day.  Allergies as of 11/27/2016      Reactions   Tramadol Hcl Itching, Nausea And Vomiting   Causes SEIZURES   Aspirin Nausea And Vomiting   Due to Stomach ulcer   Ibuprofen Other (See Comments)   Due to stomach ulcer   Penicillins Itching, Swelling, Rash   Breakout      Pseudoephedrine Itching, Swelling   Tongue swelling   Sulfa Antibiotics Nausea And Vomiting   Latex Itching, Rash      Medication List      ACCU-CHEK  AVIVA PLUS test strip Generic drug:  glucose blood TEST once daily   ACCU-CHEK SOFTCLIX LANCETS lancets Use to check blood sugar once daily in the morning prior to eating.   acetaminophen 325 MG tablet Commonly known as:  TYLENOL Take 650 mg by mouth every 4 (four) hours as needed for mild pain or headache.   azelastine 0.1 % nasal spray Commonly known as:  ASTELIN Place 2 sprays into both nostrils 2 (two) times daily. Use in each nostril as directed   benztropine 0.5 MG tablet Commonly known as:  COGENTIN Take 0.5 mg by mouth at bedtime.   budesonide-formoterol 160-4.5 MCG/ACT inhaler Commonly known as:  SYMBICORT Inhale 2 puffs into the lungs 2 (two) times daily.   Cetirizine HCl 10 MG Caps Take 1 capsule (10 mg total) by mouth daily.   cholecalciferol 1000 units tablet Commonly known as:  VITAMIN D Take 2,000 Units by mouth daily.   RA VITAMIN D-3 2000 units Caps Generic drug:  Cholecalciferol   cycloSPORINE modified 100 MG Caps Take 100-125 mg by mouth 2 (two) times daily. Take 100 mg in the AM and 125 mg in the PM   cycloSPORINE modified 25 MG capsule Commonly known as:  NEORAL Use in combination with 100 mg capsules for PM dose of 125 mg.   divalproex 500 MG 24 hr tablet Commonly known as:  DEPAKOTE ER Take 2 tablets (1,000 mg total) by mouth at  bedtime.   doxepin 10 MG capsule Commonly known as:  SINEQUAN Take 10 mg by mouth at bedtime.   LAC-HYDRIN 12 % cream Generic drug:  ammonium lactate Apply topically 2 (two) times daily.   magnesium oxide 400 MG tablet Commonly known as:  MAG-OX Take 400 mg by mouth 2 (two) times daily. 5pm & 7pm.   mometasone 0.1 % cream Commonly known as:  ELOCON Apply 1 application topically 2 (two) times daily.   montelukast 10 MG tablet Commonly known as:  SINGULAIR Take 1 tablet (10 mg total) by mouth at bedtime.   multivitamin tablet Take 1 tablet by mouth daily. 9 am.   mycophenolate 250 MG capsule Commonly  known as:  CELLCEPT Take 500 mg by mouth 2 (two) times daily. 9 am & 9 pm.   omeprazole 40 MG capsule Commonly known as:  PRILOSEC Take 40 mg by mouth daily. 9 am.   polyethylene glycol packet Commonly known as:  MIRALAX / GLYCOLAX Take 17 g by mouth daily as needed (for constipation). In water or juice   prazosin 1 MG capsule Commonly known as:  MINIPRESS Take 2 mg by mouth at bedtime.   PROAIR HFA 108 (90 Base) MCG/ACT inhaler Generic drug:  albuterol Inhale 1 puff into the lungs every 4 (four) hours as needed for shortness of breath.   REFRESH TEARS 0.5 % Soln Generic drug:  carboxymethylcellulose Place 1 drop into both eyes 4 (four) times daily as needed (for dry eyes).   risperiDONE 3 MG tablet Commonly known as:  RISPERDAL Take 3 mg by mouth at bedtime.   VALCYTE PO Take 900 mg by mouth daily.       Past Medical History:  Diagnosis Date  . Abnormal vaginal bleeding   . Allergy   . Anemia   . Arthritis    right knee  . Asthma 09-23-12   Asthma somewhat causing increase wheezing and mild congestion at present  . Biliary cirrhosis ([place]) 04/26/11  . Constipation   . DUB (dysfunctional uterine bleeding)   . GERD (gastroesophageal reflux disease)   . Intellectual disability   . PONV (postoperative nausea and vomiting)   . 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 Seizures ([place])    congenital epilepsy-hx. seizures  . Vitamin D deficiency     Past Surgical History:  Procedure Laterality Date  . adenonoidectomy    . APPENDECTOMY    . CHOLECYSTECTOMY    . COLONOSCOPY W/ BIOPSIES    . ESOPHAGOGASTRODUODENOSCOPY (EGD) WITH PROPOFOL N/A 10/01/2012   Procedure: ESOPHAGOGASTRODUODENOSCOPY (EGD) WITH PROPOFOL;  Surgeon: Arta Silence, MD;  Location: [facility];  Service: Endoscopy;  Laterality: N/A;  . eustacean tubes    . EYE MUSCLE SURGERY    . LIVER TRANSPLANT      Review of systems negative except as noted in HPI / PMHx or noted below:  Review of  Systems  Constitutional: Negative.   HENT: Negative.   Eyes: Negative.   Respiratory: Negative.   Cardiovascular: Negative.   Gastrointestinal: Negative.   Genitourinary: Negative.   Musculoskeletal: Negative.   Skin: Negative.   Neurological: Negative.   Endo/Heme/Allergies: Negative.   Psychiatric/Behavioral: Negative.      Objective:   Vitals:   11/27/16 0924  BP: 108/70  Pulse: (!) 110  Resp: 18   Height: 5\' 1"  (154.9 cm)  Weight: 219 lb (99.3 kg)   Physical Exam  Constitutional: She is well-developed, well-nourished, and in no distress.  HENT:  Head: Normocephalic.  Right Ear: Tympanic membrane, external ear and ear canal normal.  Left Ear: Tympanic membrane, external ear and ear canal normal.  Nose: Nose normal. No mucosal edema or rhinorrhea.  Mouth/Throat: Uvula is midline, oropharynx is clear and moist and mucous membranes are normal. No oropharyngeal exudate.  Eyes: Conjunctivae are normal.  Right exotropia  Neck: Trachea normal. No tracheal tenderness present. No tracheal deviation present. No thyromegaly present.  Cardiovascular: Normal rate, regular rhythm, S1 normal, S2 normal and normal heart sounds.   No murmur heard. Pulmonary/Chest: Breath sounds normal. No stridor. No respiratory distress. She has no wheezes. She has no rales.  Musculoskeletal: She exhibits no edema.  Lymphadenopathy:       Head (right side): No tonsillar adenopathy present.       Head (left side): No tonsillar adenopathy present.    She has no cervical adenopathy.  Neurological: She is alert. Gait normal.  Skin: Rash ( Lichenification and hyperpigmentation palmar surface thumbs bilaterally) noted. She is not diaphoretic. No erythema. Nails show no clubbing.  Psychiatric: Mood and affect normal.    Diagnostics:    Spirometry was performed and demonstrated an FEV1 of 1.96 at 92 % of predicted.  The patient had an Asthma Control Test with the following results: ACT Total Score:  16.    Assessment and Plan:   1. Asthma, moderate persistent, well-controlled   2. Other allergic rhinitis   3. Inflammatory dermatosis   4. Immunosuppression ([place])     1. Continue Mometasone 0.1% ointment one-2 times a day if needed   2. Continue Symbicort 160 2 inhalations twice a day  3. Continue montelukast 10 mg one tablet once a day  4. Continue nasal Azelastine 2 sprays each nostril twice a day  5. Continue cetirizine 10 mg one tablet once a day  6. Continue ProAir HFA 2 puffs every 4-6 hours if needed  7. Return to clinic in December 2018 or earlier if problem  Yazlyn appears to be doing quite well on her current plan regarding control of her asthma and upper airway disease. She does appear to have some eczema involving her hands and I've encouraged her to use her mometasone at least until this reaction gets under better control. She will be following up with evaluation for chest pain with a cardiologist. I did talk with her about possibly considering decreasing her caffeine consumption as it is quite possible that her chest pain is a result of reflux which is probably being exacerbated by her caffeine use. I will see her back in this clinic in December 2018 or earlier if there is a problem.  Allena Katz, MD Allergy / Immunology [place]

## 2016-11-27 NOTE — Patient Instructions (Addendum)
  1. Continue Mometasone 0.1% ointment one-2 times a day if needed   2. Continue Symbicort 160 2 inhalations twice a day  3. Continue montelukast 10 mg one tablet once a day  4. Continue nasal Azelastine 2 sprays each nostril twice a day  5. Continue cetirizine 10 mg one tablet once a day  6. Continue ProAir HFA 2 puffs every 4-6 hours if needed  7. Return to clinic in December 2018 or earlier if problem

## 2016-11-28 ENCOUNTER — Other Ambulatory Visit: Payer: Self-pay | Admitting: Allergy and Immunology

## 2016-12-03 NOTE — Progress Notes (Signed)
Cardiology Office Note    Date:  12/04/2016   ID:  CLEONE HULICK, DOB 09/17/1975, MRN [id number]  PCP:  Lovenia Kim, MD  Cardiologist: Sinclair Grooms, MD   Chief Complaint  Patient presents with  . Chest Pain    History of Present Illness:  [name] is a 41 y.o. female with history referred for consultation by Kathrine Cords, MD for evaluation of chest pain.  Her other significant medical problems include primary biliary cirrhosis status post liver transplant, schizophrenia,, osteoarthritis, and family history of heart disease.  In early June she mentioned to Dr. Lorenso Courier an episode of central chest discomfort that lasted approximately one hour. It occurred upon arising from bed. The discomfort is somewhat poorly characterized but is central chest, has a sharp quality, and initially was worse lying down and better sitting up. Bending aggravated the discomfort. She had no subsequent difficulty until the end of June when she had recurrence of the discomfort that lasted "all day". He was described as sharp. There was no respiratory component. There was some shortness of breath associated. Since then she feels her legs have been weak, and there is been some orthopnea.  Past Medical History:  Diagnosis Date  . Abnormal vaginal bleeding   . Allergy   . Anemia   . Arthritis    right knee  . Asthma 09-23-12   Asthma somewhat causing increase wheezing and mild congestion at present  . Biliary cirrhosis ([place]) 04/26/11  . Constipation   . DUB (dysfunctional uterine bleeding)   . GERD (gastroesophageal reflux disease)   . Intellectual disability   . PONV (postoperative nausea and vomiting)   . 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 Seizures ([place])    congenital epilepsy-hx. seizures  . Vitamin D deficiency     Past Surgical History:  Procedure Laterality Date  . adenonoidectomy    . APPENDECTOMY    . CHOLECYSTECTOMY    . COLONOSCOPY W/ BIOPSIES    .  ESOPHAGOGASTRODUODENOSCOPY (EGD) WITH PROPOFOL N/A 10/01/2012   Procedure: ESOPHAGOGASTRODUODENOSCOPY (EGD) WITH PROPOFOL;  Surgeon: Arta Silence, MD;  Location: [facility];  Service: Endoscopy;  Laterality: N/A;  . eustacean tubes    . EYE MUSCLE SURGERY    . LIVER TRANSPLANT      Current Medications: Outpatient Medications Prior to Visit  Medication Sig Dispense Refill  . ACCU-CHEK AVIVA PLUS test strip TEST once daily 100 each 1  . ACCU-CHEK SOFTCLIX LANCETS lancets Use to check blood sugar once daily in the morning prior to eating. 100 each 2  . acetaminophen (TYLENOL) 325 MG tablet Take 650 mg by mouth every 4 (four) hours as needed for mild pain or headache.     Marland Kitchen ammonium lactate (LAC-HYDRIN) 12 % cream Apply topically 2 (two) times daily.     Marland Kitchen azelastine (ASTELIN) 0.1 % nasal spray Place 2 sprays into both nostrils 2 (two) times daily. Use in each nostril as directed 30 mL 3  . benztropine (COGENTIN) 0.5 MG tablet Take 0.5 mg by mouth at bedtime.    . budesonide-formoterol (SYMBICORT) 160-4.5 MCG/ACT inhaler Inhale 2 puffs into the lungs 2 (two) times daily. 1 Inhaler 5  . carboxymethylcellulose (REFRESH TEARS) 0.5 % SOLN Place 1 drop into both eyes 4 (four) times daily as needed (for dry eyes).    . cetirizine (ZYRTEC) 10 MG tablet take 1 tablet by mouth once daily 30 tablet 5  . cycloSPORINE modified (NEORAL) 25 MG capsule Use in  combination with 100 mg capsules for PM dose of 125 mg.  1  . cycloSPORINE modified 100 MG CAPS Take 100-125 mg by mouth 2 (two) times daily. Take 100 mg in the AM and 125 mg in the PM    . divalproex (DEPAKOTE ER) 500 MG 24 hr tablet Take 2 tablets (1,000 mg total) by mouth at bedtime. 120 tablet 11  . doxepin (SINEQUAN) 10 MG capsule Take 10 mg by mouth at bedtime.    . magnesium oxide (MAG-OX) 400 MG tablet Take 400 mg by mouth 2 (two) times daily. 5pm & 7pm.    . mometasone (ELOCON) 0.1 % cream Apply 1 application topically 2 (two) times daily. 45 g  5  . montelukast (SINGULAIR) 10 MG tablet Take 1 tablet (10 mg total) by mouth at bedtime. 34 tablet 5  . Multiple Vitamin (MULTIVITAMIN) tablet Take 1 tablet by mouth daily. 9 am.    . mycophenolate (CELLCEPT) 250 MG capsule Take 500 mg by mouth 2 (two) times daily. 9 am & 9 pm.    . omeprazole (PRILOSEC) 40 MG capsule Take 40 mg by mouth daily. 9 am.    . polyethylene glycol (MIRALAX / GLYCOLAX) packet Take 17 g by mouth daily as needed (for constipation). In water or juice    . prazosin (MINIPRESS) 1 MG capsule Take 2 mg by mouth at bedtime.     Marland Kitchen PROAIR HFA 108 (90 Base) MCG/ACT inhaler Inhale 1 puff into the lungs every 4 (four) hours as needed for shortness of breath. 1 Inhaler 3  . RA VITAMIN D-3 2000 units CAPS Take 1 capsule by mouth daily.   0  . risperiDONE (RISPERDAL) 3 MG tablet Take 3 mg by mouth at bedtime.    . ValGANciclovir HCl (VALCYTE PO) Take 900 mg by mouth daily.    . cholecalciferol (VITAMIN D) 1000 UNITS tablet Take 2,000 Units by mouth daily.     No facility-administered medications prior to visit.      Allergies:   Tramadol hcl; Aspirin; Ibuprofen; Penicillins; Pseudoephedrine; Sulfa antibiotics; and Latex   Social History   Social History  . Marital status: Single    Spouse name: N/A  . Number of children: 0  . Years of education: 44 th   Occupational History  .      Disabled   Social History Main Topics  . Smoking status: Never Smoker  . Smokeless tobacco: Never Used  . Alcohol use No  . Drug use: No  . Sexual activity: No   Other Topics Concern  . None   Social History Narrative   Lives alone but uncle and his wife live next door and are her primary caregivers. Sonia Side (uncle) can be reached at [phone].    Education High school education.   Disabled.   Right handed.   Caffeine tea and coffee daily.         Current Social History   (Please include date ( . td) when updating information )      Who lives at home: lives  alone 07/05/2016      Transportation: uses Medicaid transportation to and from appointments 07/05/2016   Important Relationships & Pets: Lynnda Shields lives next door 07/05/2016    Current Stressors: Person Care aid will stop this month 07/05/2016   Work / Education:  Receives disability 07/05/2016   Religious / Personal Beliefs: has strong religious beliefs 07/05/2016   Interests / Fun: Enjoys going to bible study  07/05/2016  Other: enjoys reading her bible 07/05/2016                                                                                                           Family History:  The patient's family history includes Breast cancer in her cousin; COPD in her mother; Diabetes in her mother; Heart disease in her mother.   ROS:   Please see the history of present illness.    Arthritis in her right greater than left knee. No difficulty sleeping. No recent seizures. Complains of seasonal allergies and sinusitis.  All other systems reviewed and are negative.   PHYSICAL EXAM:   VS:  BP 138/90 (BP Location: Left Arm)   Pulse (!) 112   Ht 5' (1.524 m)   Wt 212 lb 6.4 oz (96.3 kg)   BMI 41.48 kg/m    GEN: Well nourished, well developed, in no acute distress . There is moderate obesity. HEENT: normal  Neck: no JVD, carotid bruits, or masses Cardiac: RRR; no murmurs, rubs, or gallops,no edema  Respiratory:  clear to auscultation bilaterally, normal work of breathing GI: soft, nontender, nondistended, + BS MS: no deformity or atrophy  Skin: warm and dry, no rash Neuro:  Alert and Oriented x 3, Strength and sensation are intact Psych: euthymic mood, full affect  Wt Readings from Last 3 Encounters:  12/04/16 212 lb 6.4 oz (96.3 kg)  11/27/16 219 lb (99.3 kg)  11/01/16 208 lb 6.4 oz (94.5 kg)      Studies/Labs Reviewed:   EKG:  EKG  10/2016 is normal On 10/30/16  Recent Labs: 10/30/2016: Hemoglobin 10.1; Platelets 130; TSH 5.870   Lipid Panel    Component Value Date/Time   CHOL 214 (H) 10/30/2016 1111    TRIG 153 (H) 10/30/2016 1111   HDL 52 10/30/2016 1111   CHOLHDL 4.1 10/30/2016 1111   CHOLHDL 6.0 (H) 04/28/2015 0928   VLDL 47 (H) 04/28/2015 0928   LDLCALC 131 (H) 10/30/2016 1111   LDLDIRECT 64 07/10/2011 1115    Additional studies/ records that were reviewed today include:  2013 echocardiogram: Study Conclusions  - Left ventricle: The cavity size was normal. Systolic function was normal. The estimated ejection fraction was in the range of 60% to 65%. Wall motion was normal; there were no regional wall motion abnormalities. - Atrial septum: The interatrial septum was hypermobile.   ASSESSMENT:    1. Precordial pain   2. Hyperlipidemia LDL goal <100   3. Schizophrenia, paranoid type ([place])   4. Asthma with allergic rhinitis, unspecified asthma severity, unspecified whether complicated, unspecified whether persistent      PLAN:  In order of problems listed above:  1. Atypical in nature. No clinical abnormalities to suggest infarction or LV dysfunction. A myocardial perfusion imaging study will be performed to exclude coronary disease. My general overall impression is that the discomfort is musculoskeletal or GI and non-ischemic. 2. Most recent LDL is 132. Encouraged plan based and with decrease and will fat intake.  Plan stress test to rule out coronary artery disease.  Medication Adjustments/Labs and Tests Ordered: Current medicines are reviewed at length with the patient today.  Concerns regarding medicines are outlined above.  Medication changes, Labs and Tests ordered today are listed in the Patient Instructions below. Patient Instructions  Medication Instructions:  None  Labwork: None  Testing/Procedures: Your physician has requested that you have a lexiscan myoview. For further information please visit [email or web address]. Please follow instruction sheet, as given.   Follow-Up: Your physician recommends that you schedule a follow-up appointment as needed  with Dr. Tamala Julian.    Any Other Special Instructions Will Be Listed Below (If Applicable).     If you need a refill on your cardiac medications before your next appointment, please call your pharmacy.      Signed, Sinclair Grooms, MD  12/04/2016 1:51 PM    [facility] [place], [place], [place]  [address] Phone: [phone]; Fax: [phone]

## 2016-12-04 ENCOUNTER — Ambulatory Visit (INDEPENDENT_AMBULATORY_CARE_PROVIDER_SITE_OTHER): Payer: Medicaid Other | Admitting: Interventional Cardiology

## 2016-12-04 ENCOUNTER — Encounter: Payer: Self-pay | Admitting: Interventional Cardiology

## 2016-12-04 VITALS — BP 138/90 | HR 112 | Ht 60.0 in | Wt 212.4 lb

## 2016-12-04 DIAGNOSIS — E785 Hyperlipidemia, unspecified: Secondary | ICD-10-CM | POA: Diagnosis not present

## 2016-12-04 DIAGNOSIS — R072 Precordial pain: Secondary | ICD-10-CM

## 2016-12-04 DIAGNOSIS — F200 Paranoid schizophrenia: Secondary | ICD-10-CM

## 2016-12-04 DIAGNOSIS — J45909 Unspecified asthma, uncomplicated: Secondary | ICD-10-CM | POA: Diagnosis not present

## 2016-12-04 NOTE — Patient Instructions (Signed)
Medication Instructions:  None  Labwork: None  Testing/Procedures: Your physician has requested that you have a lexiscan myoview. For further information please visit [email or web address]. Please follow instruction sheet, as given.   Follow-Up: Your physician recommends that you schedule a follow-up appointment as needed with Dr. Tamala Julian.    Any Other Special Instructions Will Be Listed Below (If Applicable).     If you need a refill on your cardiac medications before your next appointment, please call your pharmacy.

## 2016-12-12 ENCOUNTER — Other Ambulatory Visit: Payer: Self-pay | Admitting: Allergy and Immunology

## 2016-12-12 MED ORDER — MONTELUKAST SODIUM 10 MG PO TABS: 10.0000 mg | ORAL_TABLET | Freq: Every day | ORAL | 3 refills | Status: DC

## 2016-12-13 ENCOUNTER — Ambulatory Visit (INDEPENDENT_AMBULATORY_CARE_PROVIDER_SITE_OTHER): Payer: Medicaid Other | Admitting: Nurse Practitioner

## 2016-12-13 ENCOUNTER — Encounter: Payer: Self-pay | Admitting: Nurse Practitioner

## 2016-12-13 ENCOUNTER — Telehealth (HOSPITAL_COMMUNITY): Payer: Self-pay | Admitting: *Deleted

## 2016-12-13 VITALS — BP 111/78 | HR 93 | Wt 213.4 lb

## 2016-12-13 DIAGNOSIS — Z5181 Encounter for therapeutic drug level monitoring: Secondary | ICD-10-CM | POA: Diagnosis not present

## 2016-12-13 DIAGNOSIS — G40909 Epilepsy, unspecified, not intractable, without status epilepticus: Secondary | ICD-10-CM | POA: Diagnosis not present

## 2016-12-13 MED ORDER — DIVALPROEX SODIUM ER 500 MG PO TB24: 1000.0000 mg | ORAL_TABLET | Freq: Every day | ORAL | 3 refills | Status: DC

## 2016-12-13 NOTE — Telephone Encounter (Signed)
Patient given detailed instructions per Myocardial Perfusion Study Information Sheet for the test on 12/18/16. Patient notified to arrive 15 minutes early and that it is imperative to arrive on time for appointment to keep from having the test rescheduled.  If you need to cancel or reschedule your appointment, please call the office within 24 hours of your appointment. . Patient verbalized understanding. [name]    

## 2016-12-13 NOTE — Patient Instructions (Signed)
Continue Depakote 500 mg extended release 2 tablets daily will refill Will check Depakote level today Follow-up in 1 year Call for any seizure activity Reviewed recent labs

## 2016-12-13 NOTE — Progress Notes (Signed)
[facility]  PATIENT: [name] DOB: 17-Jul-1975   REASON FOR VISIT: Follow-up for seizure disorder HISTORY FROM: Patient    HISTORY OF PRESENT ILLNESS:HISTORY 03/23/14 [name]): [name], 41 year old black female returns today for followup of epilepsy. her uncle is Amore Ackman, He is the power of attorney for [name]. She has a history of mild mental retardation, and long-standing history of seizures. She was well-controlled on Depakote and Tegretol extended release. But she has developed liver disease, was diagnosed with primary biliary cirrhosis,Antieptical medication was changed since February 2013, she is now taking Vimpat 100 mg 2 tabs twice a day, Keppra 500 mg 3 tabs daily. She has no significant seizure activities while taking Vimpat, and Keppra.She lives alone, uncles lives next door, she attends adult daycare regularly.She underwent liver transplantation in February 2015, doing very well, on polypharmacy treatment, this including CellCept 1000 mg twice a day, Prograft 4 mg twice a day,For her seizure, she is now taking deapkote ER 250mg  5 tabs qhs, she is having seizure about once amonth, it usually happens at nighttime, she was noted to have nighttime extremity jumping movement, tongue biting when she woke up, she also complains of mild gait difficullty,   UPDATE Oct 27th 2015:I have talked with [name] liver transplant cordinator, reported, 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, at [place], Keppra, Vimpat cause worsening behavior issue, she had a hallucinations, paranoid to the point of almost institutionalized, her mood disorder eventually was under control with Depakote ER 250 mg 5 tablets each day, [name] continues to complains that she had a 2 episode of waking up with blood in her mouth, has bitten her tongue, one in July, the second one was September 2015, overall she is tolerating the medication well, Depakote level was 63 in February 23 2014, EEG was  normal.Repeat laboratory evaluation in March 09 2014, showed decreased white count 1.6, she is receiving weekly laboratory evaluation by [name] transplant team, I have called coordinator again, is aware of her leukopenia, she is on lower dose of immunosuppressive treatment, now taking cyclosporine 200 mg twice a day, CellCept 250 mg twice a day,She has personal aid every evening  UPDATE December 08 2015:yy She is brought in by [name] today, I reviewed and summarized her to visit in January 2017, "Patient's current immunosuppression regimen is Cyclosporine 100/125mg  and Mycophenolate 500MG  BID". She has no clinical features concerning for complications of immunosuppressive drug therapy,  She is now taking Depakote DR 250mg  4 tabs qhs, she reported one episode of left hand shaking in July 2017.    Laboratory evaluation in January 2017, Depakote level 78, CBC showed mild anemia, hemoglobin 9.6, normal CMP, Update 01/16/2018CM. [name], 41 year old female returns for follow-up for seizure disorder. She has a history of liver transplant in 2015. She is currently on Depakote extended release 500 mg 2 at bedtime. She reports no seizure activity in several years. She continues to live alone, uncle lives next door. She has no new neurologic complaints. Labs from [facility] reviewed from 12/21/2015. She has not had a recent VPA level. She returns for reevaluation. UPDATE 07/09/2018CM [name], 41 year old female returns for follow-up for her history of seizure disorder. She has not had any seizure activity in several years .  She had a history of liver transplant at [facility] in 2015. She remains on Depakote 500 mg 2 tablets at bedtime. She continues to live alone family member last next door. She had recent labs at  [name] CMP however results are not in the computer. She had recent CBC on 10/30/16 which is stable. She is currently being evaluated for chest pain by Dr. Tamala [place] , she will have a myocardial  perfusion test on Tuesday . She returns for reevaluation  REVIEW OF SYSTEMS: Full 14 system review of systems performed and notable only for those listed, all others are neg:  Constitutional: neg  Cardiovascular: Chest pain Ear/Nose/Throat: neg  Skin: neg Eyes: neg Respiratory: neg Gastroitestinal: neg  Hematology/Lymphatic: neg  Endocrine: neg Musculoskeletal:neg Allergy/Immunology: neg Neurological: History of seizure disorder Psychiatric: neg Sleep : neg   ALLERGIES: Allergies  Allergen Reactions  . Tramadol Hcl Itching and Nausea And Vomiting    Causes SEIZURES  . Aspirin Nausea And Vomiting    Due to Stomach ulcer  . Ibuprofen Other (See Comments)    Due to stomach ulcer  . Penicillins Itching, Swelling and Rash    Breakout     . Pseudoephedrine Itching and Swelling    Tongue swelling  . Sulfa Antibiotics Nausea And Vomiting  . Latex Itching and Rash    HOME MEDICATIONS: Outpatient Medications Prior to Visit  Medication Sig Dispense Refill  . ACCU-CHEK AVIVA PLUS test strip TEST once daily 100 each 1  . ACCU-CHEK SOFTCLIX LANCETS lancets Use to check blood sugar once daily in the morning prior to eating. 100 each 2  . acetaminophen (TYLENOL) 325 MG tablet Take 650 mg by mouth every 4 (four) hours as needed for mild pain or headache.     Marland Kitchen ammonium lactate (LAC-HYDRIN) 12 % cream Apply topically 2 (two) times daily.     Marland Kitchen azelastine (ASTELIN) 0.1 % nasal spray Place 2 sprays into both nostrils 2 (two) times daily. Use in each nostril as directed 30 mL 3  . benztropine (COGENTIN) 0.5 MG tablet Take 0.5 mg by mouth at bedtime.    . budesonide-formoterol (SYMBICORT) 160-4.5 MCG/ACT inhaler Inhale 2 puffs into the lungs 2 (two) times daily. 1 Inhaler 5  . carboxymethylcellulose (REFRESH TEARS) 0.5 % SOLN Place 1 drop into both eyes 4 (four) times daily as needed (for dry eyes).    . cetirizine (ZYRTEC) 10 MG tablet take 1 tablet by mouth once daily 30 tablet 5  .  cycloSPORINE modified (NEORAL) 25 MG capsule Use in combination with 100 mg capsules for PM dose of 125 mg.  1  . cycloSPORINE modified 100 MG CAPS Take 100-125 mg by mouth 2 (two) times daily. Take 100 mg in the AM and 125 mg in the PM    . divalproex (DEPAKOTE ER) 500 MG 24 hr tablet Take 2 tablets (1,000 mg total) by mouth at bedtime. 120 tablet 11  . doxepin (SINEQUAN) 10 MG capsule Take 10 mg by mouth at bedtime.    . magnesium oxide (MAG-OX) 400 MG tablet Take 400 mg by mouth 2 (two) times daily. 5pm & 7pm.    . mometasone (ELOCON) 0.1 % cream Apply 1 application topically 2 (two) times daily. 45 g 5  . montelukast (SINGULAIR) 10 MG tablet Take 1 tablet (10 mg total) by mouth at bedtime. 30 tablet 3  . Multiple Vitamin (MULTIVITAMIN) tablet Take 1 tablet by mouth daily. 9 am.    . mycophenolate (CELLCEPT) 250 MG capsule Take 500 mg by mouth 2 (two) times daily. 9 am & 9 pm.    . omeprazole (PRILOSEC) 40 MG capsule Take 40 mg by mouth daily. 9 am.    . polyethylene glycol (MIRALAX /  GLYCOLAX) packet Take 17 g by mouth daily as needed (for constipation). In water or juice    . prazosin (MINIPRESS) 1 MG capsule Take 2 mg by mouth at bedtime.     Marland Kitchen PROAIR HFA 108 (90 Base) MCG/ACT inhaler Inhale 1 puff into the lungs every 4 (four) hours as needed for shortness of breath. 1 Inhaler 3  . RA VITAMIN D-3 2000 units CAPS Take 1 capsule by mouth daily.   0  . risperiDONE (RISPERDAL) 3 MG tablet Take 3 mg by mouth at bedtime.    . ValGANciclovir HCl (VALCYTE PO) Take 900 mg by mouth daily.     No facility-administered medications prior to visit.     PAST MEDICAL HISTORY: Past Medical History:  Diagnosis Date  . Abnormal vaginal bleeding   . Allergy   . Anemia   . Arthritis    right knee  . Asthma 09-23-12   Asthma somewhat causing increase wheezing and mild congestion at present  . Biliary cirrhosis ([place]) 04/26/11  . Constipation   . DUB (dysfunctional uterine bleeding)   . GERD  (gastroesophageal reflux disease)   . Intellectual disability   . PONV (postoperative nausea and vomiting)   . Primary biliary cirrhosis ([place]) 05/07/2011  . Seizures ([place])    congenital epilepsy-hx. seizures  . Vitamin D deficiency     PAST SURGICAL HISTORY: Past Surgical History:  Procedure Laterality Date  . adenonoidectomy    . APPENDECTOMY    . CHOLECYSTECTOMY    . COLONOSCOPY W/ BIOPSIES    . ESOPHAGOGASTRODUODENOSCOPY (EGD) WITH PROPOFOL N/A 10/01/2012   Procedure: ESOPHAGOGASTRODUODENOSCOPY (EGD) WITH PROPOFOL;  Surgeon: Arta Silence, MD;  Location: [facility];  Service: Endoscopy;  Laterality: N/A;  . eustacean tubes    . EYE MUSCLE SURGERY    . LIVER TRANSPLANT      FAMILY HISTORY: Family History  Problem Relation Age of Onset  . Diabetes Mother   . Heart disease Mother   . COPD Mother   . Breast cancer Cousin     SOCIAL HISTORY: Social History   Social History  . Marital status: Single    Spouse name: N/A  . Number of children: 0  . Years of education: 48 th   Occupational History  .      Disabled   Social History Main Topics  . Smoking status: Never Smoker  . Smokeless tobacco: Never Used  . Alcohol use No  . Drug use: No  . Sexual activity: No   Other Topics Concern  . Not on file   Social History Narrative   Lives alone but uncle and his wife live next door and are her primary caregivers. [name] (uncle) can be reached at [phone].    Education High school education.   Disabled.   Right handed.   Caffeine tea and coffee daily.         Current Social History   (Please include date ( . td) when updating information )      Who lives at home: lives  alone 07/05/2016    Transportation: uses Medicaid transportation to and from appointments 07/05/2016   Important Relationships & Pets: [name] lives next door 07/05/2016    Current Stressors: Person Care aid will stop this month 07/05/2016   Work / Education:  Receives disability 07/05/2016    Religious / Personal Beliefs: has strong religious beliefs 07/05/2016   Interests / Fun: Enjoys going to bible study  07/05/2016   Other: enjoys reading  her bible 07/05/2016                                                                                                           PHYSICAL EXAM  Vitals:   12/13/16 1037  BP: 111/78  Pulse: 93  Weight: 213 lb 6.4 oz (96.8 kg)   Body mass index is 41.68 kg/m. Generalized: Well developed, Obese female in no acute distress   Neurological examination  Mentation: Alert oriented to time, place, history taking. Follows all commands speech and language fluent Cranial nerve II-XII: Pupils were equal round reactive to light. Extraocular movements were full, visual field were full on confrontational test. Left exotropia Facial sensation and strength were normal. Uvula tongue midline. Head turning and shoulder shrug  were normal and symmetric. Motor: The motor testing reveals 5 over 5 strength of all 4 extremities. Good symmetric motor tone is noted throughout.  Sensory: Sensory testing is intact to soft touch on all 4 extremities. No evidence of extinction is noted.  Coordination: Cerebellar testing reveals good finger-nose-finger and heel-to-shin bilaterally.  Gait and station: Gait is normal. Tandem gait is normal. Romberg is negative. No drift is seen.  Reflexes: Deep tendon reflexes are symmetric and normal bilaterally.   DIAGNOSTIC DATA (LABS, IMAGING, TESTING) - I reviewed patient records, labs, notes, testing and imaging myself where available.  Lab Results  Component Value Date   WBC 3.2 (L) 10/30/2016   HGB 10.1 (L) 10/30/2016   HCT 29.8 (L) 10/30/2016   MCV 80 10/30/2016   PLT 130 (L) 10/30/2016      Component Value Date/Time   NA 140 06/09/2015 0957   K 4.9 06/09/2015 0957   CL 104 06/09/2015 0957   CO2 22 06/09/2015 0957   GLUCOSE 103 (H) 06/09/2015 0957   GLUCOSE 104 (H) 06/28/2014 0932   BUN 23 06/09/2015 0957    CREATININE 0.79 06/09/2015 0957   CREATININE 0.83 06/28/2014 0932   CALCIUM 8.9 06/09/2015 0957   CALCIUM 9.0 09/11/2007 2301   PROT 6.9 06/09/2015 0957   ALBUMIN 3.9 06/09/2015 0957   AST 11 06/09/2015 0957   ALT 7 06/09/2015 0957   ALKPHOS 63 06/09/2015 0957   BILITOT 0.3 06/09/2015 0957   GFRNONAA 93 06/09/2015 0957   GFRNONAA >89 11/09/2011 1208   GFRAA 108 06/09/2015 0957   GFRAA >89 11/09/2011 1208    Lab Results  Component Value Date   HGBA1C 5.3 03/29/2016    Lab Results  Component Value Date   TSH 5.870 (H) 10/30/2016      ASSESSMENT AND PLAN  41 y.o. year old female  has a past medical history of  Biliary cirrhosis ([place]) (04/26/11); Liver transplant 2015  Intellectual disability; (05/07/2011); Seizures ([address]); and Vitamin D deficiency. here to follow-up for her seizure disorder  PLAN: Continue Depakote 500 mg extended release 2 tablets daily will refill Will check Depakote level today Follow-up in 1 year Call for any seizure activity Reviewed recent labs [place], [facility], [facility], [facility] [address]  [place], [place] [address] [phone]

## 2016-12-13 NOTE — Progress Notes (Signed)
I have reviewed and agreed above plan. 

## 2016-12-14 LAB — VALPROIC ACID LEVEL: Valproic Acid Lvl: 77 ug/mL (ref 50–100)

## 2016-12-18 ENCOUNTER — Telehealth: Payer: Self-pay | Admitting: *Deleted

## 2016-12-18 ENCOUNTER — Ambulatory Visit (HOSPITAL_COMMUNITY): Payer: Medicaid Other | Attending: Cardiology

## 2016-12-18 DIAGNOSIS — Z8249 Family history of ischemic heart disease and other diseases of the circulatory system: Secondary | ICD-10-CM | POA: Insufficient documentation

## 2016-12-18 DIAGNOSIS — R072 Precordial pain: Secondary | ICD-10-CM

## 2016-12-18 DIAGNOSIS — E785 Hyperlipidemia, unspecified: Secondary | ICD-10-CM | POA: Diagnosis not present

## 2016-12-18 LAB — MYOCARDIAL PERFUSION IMAGING
CHL CUP NUCLEAR SRS: 4
CHL CUP NUCLEAR SSS: 12
CSEPPHR: 123 {beats}/min
LV dias vol: 54 mL (ref 46–106)
LV sys vol: 14 mL
RATE: 0.23
Rest HR: 86 {beats}/min
SDS: 8
TID: 0.68

## 2016-12-18 MED ORDER — REGADENOSON 0.4 MG/5ML IV SOLN
0.4000 mg | Freq: Once | INTRAVENOUS | Status: AC
  Administered 2016-12-18: 0.4 mg via INTRAVENOUS

## 2016-12-18 MED ORDER — TECHNETIUM TC 99M TETROFOSMIN IV KIT
32.9000 | PACK | Freq: Once | INTRAVENOUS | Status: AC | PRN
  Administered 2016-12-18: 32.9 via INTRAVENOUS
  Filled 2016-12-18: qty 33

## 2016-12-18 MED ORDER — TECHNETIUM TC 99M TETROFOSMIN IV KIT
10.3000 | PACK | Freq: Once | INTRAVENOUS | Status: AC | PRN
  Administered 2016-12-18: 10.3 via INTRAVENOUS
  Filled 2016-12-18: qty 11

## 2016-12-18 NOTE — Telephone Encounter (Signed)
LVM informing patient that her lab showed a good level of depakote. Advised she continue taking the same dose of Depakote. Left number for any questions.

## 2016-12-19 ENCOUNTER — Other Ambulatory Visit: Payer: Self-pay | Admitting: Neurology

## 2016-12-20 ENCOUNTER — Ambulatory Visit (INDEPENDENT_AMBULATORY_CARE_PROVIDER_SITE_OTHER): Payer: Medicaid Other | Admitting: Student in an Organized Health Care Education/Training Program

## 2016-12-20 ENCOUNTER — Encounter: Payer: Self-pay | Admitting: Student in an Organized Health Care Education/Training Program

## 2016-12-20 VITALS — Ht 60.0 in | Wt 212.6 lb

## 2016-12-20 DIAGNOSIS — Z6841 Body Mass Index (BMI) 40.0 and over, adult: Secondary | ICD-10-CM | POA: Diagnosis not present

## 2016-12-20 DIAGNOSIS — E669 Obesity, unspecified: Secondary | ICD-10-CM | POA: Diagnosis present

## 2016-12-20 DIAGNOSIS — IMO0001 Reserved for inherently not codable concepts without codable children: Secondary | ICD-10-CM

## 2016-12-20 NOTE — Progress Notes (Signed)
Assessment: Primary concerns today: Weight management.   Learning Readiness:   Not ready   Contemplating   Ready   Change in progress   Usual eating pattern includes 3 meals and 2 snacks per day.  Frequent foods and beverages include fish, shrimp, cauliflower, carrots, broccoli.  Usual physical activity includes  Walking 2x daily, elliptical, exercise DVD.   24-hr recall:  (Up at AM) - 4:00 AM B ( AM) - 9:50 AM: egg, cheese and sausage croissant by Danton Clap, 12 oz orange juice Snk ( AM)-  L ( PM)- 12:30 PM: canned progresso chicken noodle soup with crackers (small package with about 8-12 crackers), 2 cups cherries, bottle of water Snk ( PM)- 2PM plain greek yogurt 1.5 cups of yogurt with frozen fruit in it D ( PM)- 5 honey glazed BBQ chicken wings, 1 corn on the cobb, canned beets with vinegar, 16 oz diet green tea, drumstick ice cream Snk ( PM) - 7 PM trail mix 1/2 cup ([place]) Went to bed 930 PM  Exercised after lunch, and before dinner elipitical machine for one hour  Vegetables typically include fried okra (frozen), frozen mixed vegetables, green beans  Typical day? Yes.     Handouts given during visit include:   AVS   goal sheet  Demonstrated degree of understanding via: Teach Back  Barriers to learning/adherence to lifestyle change: financial and cognitive

## 2016-12-20 NOTE — Patient Instructions (Addendum)
Consider purchasing the following vegetables to add variety to your diet: - collard greens - mustard greens (you can get these frozen) - broccoli (frozen) - sugar snap peas (frozen) - asparagus - turnips - cabbage  Snack: mixed nuts with dried fruit would be better for you than the snack packs that have candy included.  Follow up: 9 AM appointment on August 23rd 60 minutes

## 2016-12-21 ENCOUNTER — Other Ambulatory Visit: Payer: Self-pay | Admitting: Allergy and Immunology

## 2016-12-21 MED ORDER — PROAIR HFA 108 (90 BASE) MCG/ACT IN AERS
1.0000 | INHALATION_SPRAY | RESPIRATORY_TRACT | 5 refills | Status: DC | PRN
Start: 2016-12-21 — End: 2017-08-13

## 2016-12-21 MED ORDER — BUDESONIDE-FORMOTEROL FUMARATE 160-4.5 MCG/ACT IN AERO: 2.0000 | INHALATION_SPRAY | Freq: Two times a day (BID) | RESPIRATORY_TRACT | 5 refills | Status: DC

## 2016-12-24 ENCOUNTER — Other Ambulatory Visit: Payer: Self-pay | Admitting: *Deleted

## 2016-12-24 MED ORDER — ACCU-CHEK AVIVA PLUS VI STRP: ORAL_STRIP | 3 refills | Status: DC

## 2017-01-17 ENCOUNTER — Ambulatory Visit (INDEPENDENT_AMBULATORY_CARE_PROVIDER_SITE_OTHER): Payer: Medicaid Other | Admitting: Family Medicine

## 2017-01-17 DIAGNOSIS — E669 Obesity, unspecified: Secondary | ICD-10-CM | POA: Diagnosis present

## 2017-01-17 NOTE — Progress Notes (Signed)
Assessment: Primary concerns today: Weight management.   Learning Readiness:   Ready   Usual eating pattern includes 3 meals and 2 snacks per day.  Frequent foods and beverages include: Protein/vegetables:Fish, Shrimp, Onion rings, Greens beans, Cucumber, Baby Carrots w/ Ranch.  Beverages:Diet Tea (2-3 times a day 16oz), Cranberry Mango (32oz Juice, no sugar added),Water (48 oz [place]), Milk (Fat Free, 16oz).  Snack: Skinnygirl popcorn (lime), Cucumber, Crackers (saltine) Mozz sticks, Cherries (cup), Homemade cookies (peanut choco) Usual physical activity includes  Walking 2x daily, elliptical, exercise DVD.   24-hr recall:  (Up at AM) - 5:00 AM B (AM) - 9:30 AM: cheese sticks (1, Mozz), 1 boiled egg, 1/2 cup of peanuts, cinnamon toast crunch ( 2 cups), Diet tea 16oz Snk (AM)- L (PM)- 12:00 PM: Ham (left over roast 2-3 oz), sugar snap peas (3 cups) w/ bacon bits, homemade mash potatoes w/ cheese 1 cup, Diet tea 16 oz. Snk ( PM)- 2PM Sweet potatoes Doritos chips Organic (12), Water 16 oz.  D (PM)- 4:50 pm Roasted Rotisserie Chicken Wings (6 wings), Mixed vegetables (2 cups, frozen), pineapple (3 slices), Cottage Cheese 1/2 cup, Diet tea 16 oz. Snk (PM) - 7 PM- Microwave popcorn (regular), one cheese stick (mozz) Went to bed 930 PM  Exercised before lunch 10:00 am (1 hr, videos exercises), and before dinner 3:00 pm ellipitical machine for 35 min.  Vegetables typically include fried okra (frozen), frozen mixed vegetables, green beans  Typical day? Yes.     Handouts given during visit include:   AVS   goal sheet  Demonstrated degree of understanding via: Teach Back  Barriers to learning/adherence to lifestyle change: financial and cognitive

## 2017-01-17 NOTE — Patient Instructions (Addendum)
It was great seeing you today! We have addressed the following issues today  1. Use more fresh fruits for snacks; use fewer snacks like popcorn, chips, crackers 2. Have water for beverage at least for one meal 3. Limit any sweet tasting beverages, no more than a cup per day. 4. Bring fresh fruits on Monday from home and choose flat bread or pancakes, not both. 5. Schedule an appointment for October 18 at 10:30 (60 min) in nutrition clinic.  If we did any lab work today, and the results require attention, either me or my nurse will get in touch with you. If everything is normal, you will get a letter in mail and a message via . If you don't hear from [place] in two weeks, please give [place] a call. Otherwise, we look forward to seeing you again at your next visit. If you have any questions or concerns before then, please call the clinic at [phone].  Please bring all your medications to every doctors visit  Sign up for My Chart to have easy access to your labs results, and communication with your Primary care physician. Please ask Front Desk for some assistance.   Please check-out at the front desk before leaving the clinic.    Take Care,   Dr. Andy Gauss

## 2017-01-22 ENCOUNTER — Ambulatory Visit: Payer: Self-pay | Admitting: Family Medicine

## 2017-01-25 ENCOUNTER — Ambulatory Visit: Payer: Self-pay | Admitting: Family Medicine

## 2017-02-05 ENCOUNTER — Other Ambulatory Visit: Payer: Self-pay | Admitting: Allergy and Immunology

## 2017-02-05 NOTE — Telephone Encounter (Signed)
Patient would like a refill on her nasal spray, azelastine. [facility]

## 2017-02-06 MED ORDER — AZELASTINE HCL 0.1 % NA SOLN: 2.0000 | Freq: Two times a day (BID) | NASAL | 4 refills | Status: DC

## 2017-02-06 NOTE — Telephone Encounter (Signed)
Patient advised script sent in  

## 2017-02-06 NOTE — Telephone Encounter (Signed)
Sent script

## 2017-02-13 ENCOUNTER — Encounter: Payer: Self-pay | Admitting: Family Medicine

## 2017-02-13 ENCOUNTER — Ambulatory Visit (INDEPENDENT_AMBULATORY_CARE_PROVIDER_SITE_OTHER): Payer: Medicaid Other | Admitting: Family Medicine

## 2017-02-13 VITALS — BP 130/94 | HR 99 | Temp 98.1°F | Wt 217.0 lb

## 2017-02-13 DIAGNOSIS — Z79899 Other long term (current) drug therapy: Secondary | ICD-10-CM

## 2017-02-13 MED ORDER — ACCU-CHEK AVIVA PLUS VI STRP: ORAL_STRIP | 3 refills | Status: DC

## 2017-02-13 MED ORDER — TRAZODONE HCL 50 MG PO TABS: 25.0000 mg | ORAL_TABLET | Freq: Every evening | ORAL | 3 refills | Status: DC | PRN

## 2017-02-13 MED ORDER — ACCU-CHEK SOFTCLIX LANCETS MISC: 2 refills | Status: DC

## 2017-02-13 NOTE — Progress Notes (Signed)
   Subjective:   Patient ID: CLOVA MORLOCK    DOB: 07-22-1975, 41 y.o. female   MRN: [id number]  CC: meet new doctor  HPI: Emmarose A Hearns is a 41 y.o. female who presents to clinic today to meet new pcp.  She is from [place] and has lived here all her life.  Currently lives by herself however her uncle lives next door and takes care of her.  Medication refills requested.   She currently feels well with no fevers, chills, nausea, vomiting, diarrhea.  Has not recently been sick.   Denies chest pain, shortness of breath, palpitations.    ROS: See HPI for pertinent ROS. [place]: Pertinent past medical, surgical, family, and social history were reviewed and updated as appropriate. Smoking status reviewed. Medications reviewed.  Objective:   BP (!) 130/94   Pulse 99   Temp 98.1 F (36.7 C) (Oral)   Wt 217 lb (98.4 kg)   SpO2 99%   BMI 42.38 kg/m  Vitals and nursing note reviewed.  General: 41 yo female, NAD  HEENT: NCAT, MMM, op clear  Neck: supple CV: RRR no MRG  Lungs: CTAB, non-labored  Abdomen: soft, non-tender, no masses or organomegaly palpable, normoactive bowel sounds Skin: warm, dry Extremities: warm and well perfused  Assessment & Plan:   41 yo female presents to clinic to meet new PCP.  She has no issues today and states she is in good health.  Requesting refill of lancets and test strips.   Health Maintenance: -Due for flu shot next visit or when available  Meds ordered this encounter  Medications  . ACCU-CHEK SOFTCLIX LANCETS lancets    Sig: Use to check blood sugar once daily in the morning prior to eating.    Dispense:  100 each    Refill:  2  . ACCU-CHEK AVIVA PLUS test strip    Sig: TEST once daily    Dispense:  100 each    Refill:  3  . traZODone (DESYREL) 50 MG tablet    Sig: Take 0.5-1 tablets (25-50 mg total) by mouth at bedtime as needed for sleep.    Dispense:  30 tablet    Refill:  3    Lovenia Kim, MD [place],  PGY-2 02/17/2017 8:53 PM

## 2017-02-13 NOTE — Patient Instructions (Addendum)
It was nice meeting you today.  I have sent in refills for your lancets and test strips to your pharmacy to pick up as you requested.  In addition, we discussed some of your other issues and will have you follow up in 2-3 months.    Be well,  Lovenia Kim, MD

## 2017-03-07 ENCOUNTER — Ambulatory Visit: Payer: Self-pay | Admitting: Internal Medicine

## 2017-03-14 ENCOUNTER — Ambulatory Visit (INDEPENDENT_AMBULATORY_CARE_PROVIDER_SITE_OTHER): Payer: Medicaid Other | Admitting: Family Medicine

## 2017-03-14 NOTE — Progress Notes (Signed)
      [name] is a 41 y.o. female who presents to the [facility] today for a nutrition visit.   Learning Readiness:   Ready  Usual eating pattern includes 3 meals and 2 snacks per day  Usual eating pattern includes 3 meals and 2 snacks per day.  Frequent foods and beverages include: Protein/vegetables:Fish, Shrimp, Onion rings, Greens beans, Cucumber, Baby Carrots w/ Ranch. Usual physical activity includes sweating to the oldies (1.5hrs) three times per week. Monday and Fridays ? Elliptical 44min.  Reports doing sit ups for 50 minutes.   24-hr recall: (Up at 0800 AM) B ( 10 AM)-     Cheese Grits (1 pack) with water, 1/2 cup egg white, 12oz chocolate milk 1% Snk ( AM)-   No snack L ( 12 PM)-  Pink salmon patties, snap peas (1 cup), cheese stick, pineapple (2 round slices), 24QA water Snk ( 250PM)- popcorn (skinny girl lime with salt- ~160 calories) D ( PM)-  Pink salmon patty, popcorn shrimp (oven crunchy-- 210 calories) and cocktail sauce. Snap peas (4 cups) Corn cob (1/2 piece) with butter. Gatorade zero (10 calories). Baked potato with cheese (one slice).  Snk ( PM)-  Sweet potato chips (140 calories-10 chips), 12 oz 1% milk  Typical day? Yes.   aside from not having a snack between breakfast and lunch and usually has 6oz chocolate milk with breakfast  Handouts given during visit include:  AVS  Goal sheet  Demonstrated degree of understanding via:  Teach Back  Barriers to learning/adherence to lifestyle change: financial and intellectual   Ht 5' (1.524 m)   Wt 214 lb (97.1 kg)   BMI 41.79 kg/m

## 2017-03-14 NOTE — Patient Instructions (Addendum)
[place], you were seen today in nutrition clinic and we discussed lots of things.  We made some new goals that you can see below:  Goals: 1. Limit chocolate milk to 6oz per day (add white milk if you want more than that) 2. One serving protein, and no more than 2 serving carb food per meal  I have provided you with a goals sheet so that you can make sure you make these goals. We also went over your grocery list and have made some recommendations.   The best thing you can do for lowering your cholesterol is to try to lose weight by continuing to exercise, limiting 2 carb foods per meal and avoiding processed foods and limiting fatty foods.   Please follow up in Nutrition clinic on December 6th 2018 at 9:30AM (60 min)  It was very nice to see you today!  Ryin Schillo L. Rosalyn Gess, [place] Resident PGY-2 03/14/2017 11:43 AM

## 2017-04-04 ENCOUNTER — Ambulatory Visit: Payer: Medicaid Other | Admitting: Student

## 2017-04-04 NOTE — Patient Instructions (Addendum)
It was great seeing you today! Here are the goals we set up today  1. One serving protein at each meal  2. Limit starchy foods to TWO per meal and ONE per snack. ONE portion of a starchy  food is equal to the following:   - ONE slice of bread (or its equivalent, such as half of a hamburger bun).   - 1/2 cup of a "scoopable" starchy food such as potatoes or rice.   - 15 grams of Total Carbohydrate as shown on food label.   - If you are not sure of the portion size is, please refer to the paper from your liver doctor's dietitian   Carbohydrate includes starch, sugar, and fiber.  Of these, only sugar and starch raise blood glucose.  (Fiber is found in fruits, vegetables [especially skin, seeds, and stocks] and whole grains.)   Starchy (carb) foods: Bread, rice, pasta, potatoes, corn, cereal, grits, crackers, bagels, muffins, all baked goods.  (Fruits, milk, and yogurt also have carbohydrate, but most of these foods will not spike your blood sugar as most starchy foods will.) Protein foods: Meat, fish, poultry, eggs, dairy foods, and beans such as pinto and kidney beans (beans also provide carbohydrate).   Please check-out at the front desk before leaving the clinic.    Take Care,   .[email or web address]

## 2017-04-04 NOTE — Progress Notes (Signed)
Primary concerns today: Weight management.  Patient was last seen in our clinic about 3 weeks ago.  At that time, the goals were limiting chocolate milk to 6 ounce a day and one serving of protein and no more than 2 servings of carb food per meal.  Patient brought here daily reports to this visit.  She did not have any chocolate milk since she was here last time because head daycare did not provide.  She had 1-3 servings of protein per day and 1-4 servings of carbohydrate per day.   Recent physical activity includes daily walking at daycare  Progress Towards Goal(s):  In progress.  She lost 3 pounds since she was here last time      Intervention/New goal:  1. One serving protein at each meal 2. Limit starchy foods to TWO per meal and ONE per snack.   Patient to tally her daily servings of protein and carbohydrate.    ONE portion of a starchy  food is equal to the following:  - ONE slice of bread (or its equivalent, such as half of a hamburger bun).  - 1/2 cup of a "scoopable" starchy food such as potatoes or rice.  - 15 grams of Total Carbohydrate as shown on food label.  - If you are not sure of the portion size is, please refer to the paper from your liver doctor's dietitian   Carbohydrate includes starch, sugar, and fiber.  Of these, only sugar and starch raise blood glucose.  (Fiber is found in fruits, vegetables [especially skin, seeds, and stocks] and whole grains.)   Starchy (carb) foods: Bread, rice, pasta, potatoes, corn, cereal, grits, crackers, bagels, muffins, all baked goods.  (Fruits, milk, and yogurt also have carbohydrate, but most of these foods will not spike your blood sugar as most starchy foods will.) Protein foods: Meat, fish, poultry, eggs, dairy foods, and beans such as pinto and kidney beans (beans also provide carbohydrate).   Barriers to learning/adherence to lifestyle change: Intellectual disability  Demonstrated degree of understanding via:  Teach Back    Monitoring/Evaluation:  Dietary intake, exercise, and body weight in 4 week(s).  Greater than 50% of the 55 minutes encounter was spent on counseling and educating the patient

## 2017-04-05 ENCOUNTER — Encounter: Payer: Self-pay | Admitting: Student

## 2017-04-10 ENCOUNTER — Other Ambulatory Visit: Payer: Self-pay | Admitting: Allergy and Immunology

## 2017-04-10 MED ORDER — MONTELUKAST SODIUM 10 MG PO TABS: 10.0000 mg | ORAL_TABLET | Freq: Every day | ORAL | 1 refills | Status: DC

## 2017-04-10 NOTE — Telephone Encounter (Signed)
Received fax for refill on montelukast. Patient was last seen 11/27/2016. Refill was sent in.

## 2017-04-16 ENCOUNTER — Other Ambulatory Visit: Payer: Self-pay | Admitting: Gastroenterology

## 2017-04-16 DIAGNOSIS — R1310 Dysphagia, unspecified: Secondary | ICD-10-CM

## 2017-04-25 ENCOUNTER — Ambulatory Visit
Admission: RE | Admit: 2017-04-25 | Discharge: 2017-04-25 | Disposition: A | Discharge status: Home / Self Care | Payer: Medicaid Other | Source: Ambulatory Visit | Attending: Gastroenterology | Admitting: Gastroenterology

## 2017-04-25 DIAGNOSIS — R1310 Dysphagia, unspecified: Secondary | ICD-10-CM

## 2017-04-26 ENCOUNTER — Telehealth: Payer: Self-pay | Admitting: *Deleted

## 2017-04-26 NOTE — Telephone Encounter (Signed)
Patient left message on nurse line stating she has headache and feels lightheaded. Returned call. States headache has resolved but continues with feeling lightheaded. Also c/o stuffy nose and sneezing. Denies fever. States she's resting and eating normally and drinking fluids. Asked her to check BS while on phone. CBG 102. Was 49 yesterday. Sched appt with PCP for Monday at [place]. Patient aware to go to Urgent Care or ED if symptoms worsen over weekend. Hubbard Hartshorn, RN, BSN

## 2017-04-29 ENCOUNTER — Ambulatory Visit: Payer: Medicaid Other | Admitting: Family Medicine

## 2017-04-29 VITALS — BP 130/82 | HR 105 | Temp 98.1°F | Ht 64.0 in | Wt 214.4 lb

## 2017-04-29 DIAGNOSIS — R42 Dizziness and giddiness: Secondary | ICD-10-CM

## 2017-04-29 LAB — POCT HEMOGLOBIN: Hemoglobin: 10.1 g/dL — AB (ref 12.2–16.2)

## 2017-04-29 NOTE — Assessment & Plan Note (Addendum)
Fall likely due to orthostatic hypotension vs seizure disorder as cause.  Vasovagal and cardiac causes unlikely as she is young and does not have many risk factors.  BP in office today is 130/82 and orthostatics performed - laying 100/80, sitting 101/80, standing 80/60.  Additionally, could consider history of anemia as possible cause.  As of June 2018, Hgb was 10.1.  Repeat POC Hgb in office today is 10.1 and stable.     -Will continue to monitor as this was an isolated event; could consider EKG and further cardiac workup if repeat episodes  -Advised her to make sure she is eating and drinking well  -Strict return precautions discussed

## 2017-04-29 NOTE — Patient Instructions (Addendum)
You were seen in clinic today for lightheadedness and an episode of passing out in your kitchen on Friday.  Your symptoms are most likely related to your seizure disorder however we have ruled out other causes such as checking her blood pressure and your hemoglobin level.  I would recommend if this occurs again, to be seen in the ER for a workup.  At that time you may need an EKG along with other testing to make sure this is not related to your heart.  In the meantime, I would like for you to take your seizure medication as directed and do not miss any doses. It is also important that are well hydrated and drink plenty of fluids.  If you have any new or worsening symptoms please return to be seen.  Be well, Lovenia Kim, MD

## 2017-04-29 NOTE — Progress Notes (Signed)
   Subjective:   Patient ID: [name]    DOB: 1975/10/17, 41 y.o. female   MRN: [id number]  CC: lightheadedness, fall   HPI: [name] is a 41 y.o. female who presents to clinic today after an episode of lightheadedness followed by syncope.    Lightheadedness Patient reports she passed out in her kitchen on Friday morning.  She had been at the fridge taking her medicine when she felt dizzy and fell into the fridge.  She believes she blacked out for about 3-4 minutes.  She denies hitting her head.  She reports the fall was preceded by her vision darkening.  She denies diaphoresis, chest pain, shortness of breath and palpitations.  The fall was unwitnessed as she lives by herself.  She denies feeling confused when she came to.  She called her social worker to tell her what happened and was advised to go to the doctor for workup.  She has not had any similar episodes in the past.  She has not had any additional episodes of dizziness or lightheadedness over the weekend.  She does have a history of seizure disorder however this did not feel like a seizure to her. She has been compliant with her seizure medications.     ROS:  Denies fevers, chills, nausea, vomiting.  Denies dizziness, headache.    [place]: Pertinent past medical, surgical, family, and social history were reviewed and updated as appropriate. Smoking status reviewed. Medications reviewed. Objective:   BP 130/82 (BP Location: Left Wrist, Patient Position: Sitting, Cuff Size: Normal)   Pulse (!) 105   Temp 98.1 F (36.7 C) (Oral)   Ht 5\' 4"  (1.626 m)   Wt 214 lb 6.4 oz (97.3 kg)   SpO2 100%   BMI 36.80 kg/m  Vitals and nursing note reviewed.  General: 41 year old female, pleasant, NAD  HEENT: PERRL, EOMI, MMM  Neck: supple, no JVD  CV: RRR no MRG Lungs: CTAB, non-laboured  Abdomen: soft, NTND, +bs  Skin: warm, dry  Extremities: warm, well-perfused, no edema  Assessment & Plan:   Orthostatic lightheadedness Fall  likely due to orthostatic hypotension vs seizure disorder as cause.  Vasovagal and cardiac causes unlikely as she is young and does not have many risk factors.  BP in office today is 130/82 and orthostatics performed - laying 100/80, sitting 101/80, standing 80/60.  Additionally, could consider history of anemia as possible cause.  As of June 2018, Hgb was 10.1.  Repeat POC Hgb in office today is 10.1 and stable.     -Will continue to monitor as this was an isolated event; could consider EKG and further cardiac workup if repeat episodes  -Advised her to make sure she is eating and drinking well  -Strict return precautions discussed   Orders Placed This Encounter  Procedures  . POCT hemoglobin   Follow up: PRN   Lovenia Kim, MD [place], PGY-2 04/29/2017 4:42 PM

## 2017-05-02 ENCOUNTER — Other Ambulatory Visit: Payer: Self-pay | Admitting: Gastroenterology

## 2017-05-02 ENCOUNTER — Ambulatory Visit: Payer: Medicaid Other | Admitting: Internal Medicine

## 2017-05-02 ENCOUNTER — Encounter: Payer: Self-pay | Admitting: Internal Medicine

## 2017-05-02 VITALS — Ht 60.0 in | Wt 215.5 lb

## 2017-05-02 DIAGNOSIS — R1310 Dysphagia, unspecified: Secondary | ICD-10-CM

## 2017-05-02 DIAGNOSIS — Z6841 Body Mass Index (BMI) 40.0 and over, adult: Secondary | ICD-10-CM | POA: Diagnosis not present

## 2017-05-02 NOTE — Progress Notes (Signed)
Nutrition Clinic  Learning Readiness:   Change in progress  Objective: Unsure of goal of today's visit  No longer has a medical aide as of 07/2016, so does not have anyone helping her with meals daily. Was told that Dr. Reesa Chew would be receiving a form that she can fill out so patient can be reassessed for aide eligibility.   Usual eating pattern includes 3 meals and 2 snacks per day. Frequent foods and beverages include artificially sweetened tea, Gatorade, fried chicken wings and chicken patties (both frozen), snap peas, baby carrots with Ranch dressing.  Usual physical activity includes Charlean Sanfilippo "Sweating To the Oldies" aerobics for 1.5hrs every day.  24-hr recall: (Up at  4 AM) B ( 10 AM)-   Berniece Salines 4 piece, grilled cheese sandwich (2 slices white/wheat bread, one   slice cheese), artificially sweetened tea Snk ( AM)-   None L ( PM)-  None Snk ( 2 PM)-  Big [place] cinnamon bun, dill pickle-flavored potato chips, Gatorade D ( 5 PM)-  Chicken patty, sugar snap peas, baby carrots, dips everything in 1c of Ranch   Dressing; Gatorade Snk ( PM)-  None Typical day? No. - Didn't eat a real lunch because she had a psychiatry appointment midday.   Goals: 1. Decrease from 1c Ranch dressing with meals to 2 tbsp, measuring each time.  2. Limit carbs to TWO per meal and ONE per snack. Remember that each 8 oz of sweet drinks count as ONE carb food portion.   Handouts given during visit include:  AVS  Goals sheet  Demonstrated degree of understanding via:  Teach Back  Barriers to learning/adherence to lifestyle change: Intellectual disability  Follow up: 06/13/16 in Nutrition Clinic with Dr. Crist Infante, MD, MPH PGY-3 [facility] Pager [phone]

## 2017-05-02 NOTE — Patient Instructions (Signed)
It was nice meeting you today [name]!  Here are the goals we set today:   Goals: 1. Decrease from 1 cup of Ranch dressing with meals to 2 tablespoons, measuring each time.  2. Limit starchy foods to TWO per meal and ONE per snack. ONE portion of a starchy food is equal to the following:   - 8 ounces of sweet drink, including Gatorade  - ONE slice of bread (or its equivalent, such as half of a hamburger bun).   - 1/2 cup of a "scoopable" starchy food such as potatoes or rice.   - 15 grams of Total Carbohydrate as shown on food label.   - If you are not sure of the portion size is, please refer to the paper from your liver doctor's dietitian  Please fill out your goals sheet and bring this to your next appointment.   Be well,  Dr. Avon Gully

## 2017-05-13 ENCOUNTER — Ambulatory Visit
Admission: RE | Admit: 2017-05-13 | Discharge: 2017-05-13 | Disposition: A | Discharge status: Home / Self Care | Payer: Medicaid Other | Source: Ambulatory Visit | Attending: Gastroenterology | Admitting: Gastroenterology

## 2017-05-13 DIAGNOSIS — R1310 Dysphagia, unspecified: Secondary | ICD-10-CM

## 2017-05-13 MED ORDER — IOPAMIDOL (ISOVUE-300) INJECTION 61%
75.0000 mL | Freq: Once | INTRAVENOUS | Status: AC | PRN
  Administered 2017-05-13: 75 mL via INTRAVENOUS

## 2017-05-22 ENCOUNTER — Telehealth: Payer: Self-pay | Admitting: Family Medicine

## 2017-05-22 NOTE — Telephone Encounter (Signed)
Pt called concerning the forms her PCP was sent to get an aide to come into her home and help her again. She said the forms were sent to her a few weeks ago.

## 2017-06-06 ENCOUNTER — Other Ambulatory Visit: Payer: Self-pay | Admitting: *Deleted

## 2017-06-06 MED ORDER — CETIRIZINE HCL 10 MG PO TABS: 10.0000 mg | ORAL_TABLET | Freq: Every day | ORAL | 0 refills | Status: DC

## 2017-06-06 MED ORDER — MONTELUKAST SODIUM 10 MG PO TABS: 10.0000 mg | ORAL_TABLET | Freq: Every day | ORAL | 0 refills | Status: DC

## 2017-06-06 NOTE — Telephone Encounter (Signed)
Spoke to patient advised she needs office visit before we do PA for Symbicort. Patient states she has Symbicort currently and appt made for her to f/u. Advised patient if she runs out of medication to call us and we can provide samples to get her through until next appt. Patient verbalized understanding

## 2017-06-13 ENCOUNTER — Encounter: Payer: Self-pay | Admitting: Family Medicine

## 2017-06-13 ENCOUNTER — Ambulatory Visit: Payer: Medicaid Other | Admitting: Family Medicine

## 2017-06-13 VITALS — Ht 60.0 in | Wt 212.2 lb

## 2017-06-13 DIAGNOSIS — Z6841 Body Mass Index (BMI) 40.0 and over, adult: Secondary | ICD-10-CM | POA: Diagnosis not present

## 2017-06-13 NOTE — Patient Instructions (Addendum)
Goals: 1. Limit fat intake: no more than 1 ounce (1/4 cup) of any cheese per serving. Also, limit ranch dressing to 2 teaspoons per serving and limit cooking oil to 4 tablespoons at a time.   2. Limit carbs to TWO per meal and ONE per snack. Remember that each 8 oz of sweet drinks count as ONE carb food portion.    Your next appointment is 07/11/2017 at 9 AM.  (60 minutes).   Please schedule patient at front desk for nutrition clinic follow-up.

## 2017-06-13 NOTE — Progress Notes (Signed)
Nutrition Clinic  Learning Readiness:   Change in progress  Objective: Evaluating compliance and revising goals as needed   Usual eating pattern includes 3 meals and 2 snacks per day  Frequent foods and beverages: artificially sweetened tea, Gatorade, fried chicken wings and chicken patties (both frozen), snap peas, baby carrots with Ranch dressing  Usual physical activity: Charlean Sanfilippo "Sweating To the Oldies" aerobics sometimes, in addition to training videos on her laptop.   24-hr recall  (Up at 4 AM)    B ( 10 AM)-  Scrambled egg whites with cooking spray, topped with 1/2 cup mexican cheese, 1 slice of white bread toasted with strawberry jam (1 teaspoon), V8 splash 12 oz  Snk ( AM)-  None L ( 11:50 AM)-   1 cup raw sugar snap peas with ranch dressing (2 teaspoons), ham burrito homemade - 1 wheat tortilla, 1 tablespoon of store-bought salsa, 1/2 cup mexican cheese, 1 slice of deli ham, 1 fresh pear, and 16 oz diet sweet tea  Snk ( 12:40 PM)-  Bag of mini pretzels with 4 oz cream cheese, 16 oz diet green tea  D (4:30 PM)-   2 pieces (8 oz total) baked and breaded fish (frozen flounder), dipped in 2 teaspoons ranch dressing.  2 cups of deep-fried sugar snap peas, 1/2 an ear of boiled corn on the cob with 1 tablespoon of country crock margarine, 12 oz V8 splash      Snk ( 8 PM)-    Bag of mini pretzels with 4 oz cream cheese, 16 oz diet green tea  Typical day? Yes.     Goals: 1. Limit fat intake: no more than 1 ounce (1/4 cup) of any cheese per serving. Also, limit ranch dressing to 2 teaspoons per serving and limit cooking oil to 4 tablespoons at a time.   2. Limit carbs to TWO per meal and ONE per snack. Remember that each 8 oz of sweet drinks count as ONE carb food portion.   Handouts given during visit include:  AVS  Goals sheet  Demonstrated degree of understanding via:  Teach Back  Barriers to learning/adherence to lifestyle change: Intellectual disability  Follow up:  07/11/17 with Dr. Jenne Campus.   Encounter included more than 25 minutes of face-to-face counseling time.     Lovenia Kim, MD  PGY-2 [facility]

## 2017-06-17 ENCOUNTER — Ambulatory Visit: Payer: Self-pay | Admitting: Family Medicine

## 2017-07-04 ENCOUNTER — Other Ambulatory Visit: Payer: Self-pay | Admitting: *Deleted

## 2017-07-04 MED ORDER — CETIRIZINE HCL 10 MG PO TABS: 10.0000 mg | ORAL_TABLET | Freq: Every day | ORAL | 0 refills | Status: DC

## 2017-07-04 NOTE — Telephone Encounter (Signed)
Courtesy refill  

## 2017-07-10 ENCOUNTER — Telehealth: Payer: Self-pay

## 2017-07-10 NOTE — Telephone Encounter (Signed)
Nicki Reaper, RN with [facility], left message on nurse line to inform you that patient had a fall going out of her house. C/O ankle pain. Able to walk and weight bear. Slight swelling. He advised her to take Tylenol and placed an ace wrap to help with the swelling and told her to take it off as needed. Please advise of anything else you would like done. [phone]. Danley Danker, RN [facility] RN)

## 2017-07-11 ENCOUNTER — Ambulatory Visit: Payer: Self-pay | Admitting: Family Medicine

## 2017-07-12 NOTE — Telephone Encounter (Signed)
Please relay the following message to [place] with [facility]:  I am reassured that she is able to bear weight on it.  Would recommend  ACE wrap and icing- 20 min on and 20 min off.  She may take Tylenol prn for pain.  If she does not feel better over the next few days, would have her make an appointment to be seen by a provider. Thanks

## 2017-07-12 NOTE — Telephone Encounter (Signed)
Called Scott at [facility] and informed him of the instructions post fall per Dr. Reesa Chew. He said that he would follow up with patient and see how she is doing. He will also let the patient know to call and make an appointment if she is not feeling well.Ozella Almond, CMA

## 2017-07-16 ENCOUNTER — Other Ambulatory Visit: Payer: Self-pay | Admitting: Allergy and Immunology

## 2017-07-16 ENCOUNTER — Ambulatory Visit: Payer: Self-pay | Admitting: Allergy and Immunology

## 2017-07-16 NOTE — Telephone Encounter (Signed)
Patient is requesting a refill for Singulair. [facility] on [address]

## 2017-07-16 NOTE — Telephone Encounter (Signed)
Spoke to patient advised we would not refill medication due to her needed an appointment and she has had a courtesy refill. Patient canceled appointment for today 07/16/17. Patient verbalized understanding appt made for 08/13/17 @ 10:30am

## 2017-07-31 ENCOUNTER — Other Ambulatory Visit: Payer: Self-pay

## 2017-07-31 NOTE — Telephone Encounter (Signed)
Received fax for refill for montelukast. Patient was last seen 11/27/2016. Patient has had a courtesy refill and has been informed that no refills will be filled until she has an office visit.

## 2017-08-01 ENCOUNTER — Other Ambulatory Visit: Payer: Self-pay | Admitting: Family Medicine

## 2017-08-01 DIAGNOSIS — Z1231 Encounter for screening mammogram for malignant neoplasm of breast: Secondary | ICD-10-CM

## 2017-08-08 ENCOUNTER — Other Ambulatory Visit: Payer: Self-pay

## 2017-08-08 MED ORDER — CETIRIZINE HCL 10 MG PO TABS: 10.0000 mg | ORAL_TABLET | Freq: Every day | ORAL | 0 refills | Status: DC

## 2017-08-08 NOTE — Telephone Encounter (Signed)
Prescription has been sent in with note to make OV for further refills.

## 2017-08-13 ENCOUNTER — Encounter: Payer: Self-pay | Admitting: Allergy and Immunology

## 2017-08-13 ENCOUNTER — Ambulatory Visit: Payer: Medicaid Other | Admitting: Allergy and Immunology

## 2017-08-13 VITALS — BP 124/84 | HR 96 | Resp 20

## 2017-08-13 DIAGNOSIS — J4540 Moderate persistent asthma, uncomplicated: Secondary | ICD-10-CM | POA: Diagnosis not present

## 2017-08-13 DIAGNOSIS — L308 Other specified dermatitis: Secondary | ICD-10-CM | POA: Diagnosis not present

## 2017-08-13 DIAGNOSIS — J3089 Other allergic rhinitis: Secondary | ICD-10-CM | POA: Diagnosis not present

## 2017-08-13 DIAGNOSIS — L989 Disorder of the skin and subcutaneous tissue, unspecified: Secondary | ICD-10-CM

## 2017-08-13 MED ORDER — CETIRIZINE HCL 10 MG PO TABS: 10.0000 mg | ORAL_TABLET | Freq: Every day | ORAL | 5 refills | Status: DC

## 2017-08-13 MED ORDER — PROAIR HFA 108 (90 BASE) MCG/ACT IN AERS: 1.0000 | INHALATION_SPRAY | RESPIRATORY_TRACT | 1 refills | Status: DC | PRN

## 2017-08-13 MED ORDER — MONTELUKAST SODIUM 10 MG PO TABS: 10.0000 mg | ORAL_TABLET | Freq: Every day | ORAL | 5 refills | Status: DC

## 2017-08-13 MED ORDER — BUDESONIDE-FORMOTEROL FUMARATE 160-4.5 MCG/ACT IN AERO: 2.0000 | INHALATION_SPRAY | Freq: Two times a day (BID) | RESPIRATORY_TRACT | 5 refills | Status: DC

## 2017-08-13 MED ORDER — AZELASTINE HCL 0.1 % NA SOLN: 2.0000 | Freq: Two times a day (BID) | NASAL | 5 refills | Status: DC

## 2017-08-13 NOTE — Progress Notes (Signed)
Follow-up Note  Referring Provider: [name], MD Primary Provider: Lovenia Kim, MD Date of Office Visit: 08/13/2017  Subjective:   [name] (DOB: June 19, 1975) is a 42 y.o. female who returns to the [facility] [place] on 08/13/2017 in re-evaluation of the following:  HPI: Enya presents to this clinic in evaluation of asthma and allergic rhinoconjunctivitis and her history of inflammatory dermatosis.  Her last visit to this clinic was 27 November 2016.  She has really done very well with her respiratory tract.  She does not use a short acting bronchodilator and has no significant respiratory tract symptoms.  She has not required a systemic steroid or an antibiotic to treat any type of respiratory tract issue.  Overall she thinks that her hand eczema is doing quite well and she rarely uses any topical mometasone averaging out to about 1 time per month.  When I last saw her in this clinic she did tell me about episodes of substernal chest pain and I recommended that she discontinue all of her caffeinated drink and she has definitely modified this consumption and she has not had any issues with pain.  She did obtain the flu vaccine this year.  Allergies as of 08/13/2017      Reactions   Tramadol Hcl Itching, Nausea And Vomiting   Causes SEIZURES   Aspirin Nausea And Vomiting   Due to Stomach ulcer   Ibuprofen Other (See Comments)   Due to stomach ulcer   Penicillins Itching, Swelling, Rash   Breakout      Pseudoephedrine Itching, Swelling   Tongue swelling   Sulfa Antibiotics Nausea And Vomiting   Latex Itching, Rash      Medication List      ACCU-CHEK AVIVA PLUS test strip Generic drug:  glucose blood TEST once daily   ACCU-CHEK SOFTCLIX LANCETS lancets Use to check blood sugar once daily in the morning prior to eating.   acetaminophen 325 MG tablet Commonly known as:  TYLENOL Take 650 mg by mouth every 4 (four) hours as needed for mild pain or  headache.   azelastine 0.1 % nasal spray Commonly known as:  ASTELIN Place 2 sprays into both nostrils 2 (two) times daily. Use in each nostril as directed   benztropine 0.5 MG tablet Commonly known as:  COGENTIN Take 0.5 mg by mouth at bedtime.   budesonide-formoterol 160-4.5 MCG/ACT inhaler Commonly known as:  SYMBICORT Inhale 2 puffs into the lungs 2 (two) times daily.   cetirizine 10 MG tablet Commonly known as:  ZYRTEC Take 1 tablet (10 mg total) by mouth daily.   cycloSPORINE modified 100 MG Caps Take 100-125 mg by mouth 2 (two) times daily. Take 100 mg in the AM and 125 mg in the PM   cycloSPORINE modified 25 MG capsule Commonly known as:  NEORAL Use in combination with 100 mg capsules for PM dose of 125 mg.   divalproex 500 MG 24 hr tablet Commonly known as:  DEPAKOTE ER Take 2 tablets (1,000 mg total) by mouth at bedtime.   doxepin 10 MG capsule Commonly known as:  SINEQUAN Take 10 mg by mouth at bedtime.   FIBER (GUAR GUM) PO Take by mouth.   LAC-HYDRIN 12 % cream Generic drug:  ammonium lactate Apply topically 2 (two) times daily.   magnesium oxide 400 (241.3 Mg) MG tablet Commonly known as:  MAG-OX   magnesium oxide 400 MG tablet Commonly known as:  MAG-OX Take 400 mg by  mouth 2 (two) times daily. 5pm & 7pm.   mometasone 0.1 % cream Commonly known as:  ELOCON Apply 1 application topically 2 (two) times daily.   montelukast 10 MG tablet Commonly known as:  SINGULAIR Take 1 tablet (10 mg total) by mouth at bedtime.   multivitamin tablet Take 1 tablet by mouth daily. 9 am.   mycophenolate 250 MG capsule Commonly known as:  CELLCEPT Take 500 mg by mouth 2 (two) times daily. 9 am & 9 pm.   omeprazole 40 MG capsule Commonly known as:  PRILOSEC Take 40 mg by mouth daily. 9 am.   polyethylene glycol packet Commonly known as:  MIRALAX / GLYCOLAX Take 17 g by mouth daily as needed (for constipation). In water or juice   prazosin 1 MG  capsule Commonly known as:  MINIPRESS Take 2 mg by mouth at bedtime.   PROAIR HFA 108 (90 Base) MCG/ACT inhaler Generic drug:  albuterol Inhale 1 puff into the lungs every 4 (four) hours as needed for shortness of breath.   RA VITAMIN D-3 2000 units Caps Generic drug:  Cholecalciferol Take 1 capsule by mouth daily.   REFRESH TEARS 0.5 % Soln Generic drug:  carboxymethylcellulose Place 1 drop into both eyes 4 (four) times daily as needed (for dry eyes).   risperiDONE 3 MG tablet Commonly known as:  RISPERDAL Take 3 mg by mouth at bedtime.       Past Medical History:  Diagnosis Date  . Abnormal vaginal bleeding   . Allergy   . Anemia   . Arthritis    right knee  . Asthma 09-23-12   Asthma somewhat causing increase wheezing and mild congestion at present  . Biliary cirrhosis ([place]) 04/26/11  . Constipation   . DUB (dysfunctional uterine bleeding)   . GERD (gastroesophageal reflux disease)   . Intellectual disability   . PONV (postoperative nausea and vomiting)   . 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 Seizures ([place])    congenital epilepsy-hx. seizures  . Vitamin D deficiency     Past Surgical History:  Procedure Laterality Date  . adenonoidectomy    . APPENDECTOMY    . CHOLECYSTECTOMY    . COLONOSCOPY W/ BIOPSIES    . ESOPHAGOGASTRODUODENOSCOPY (EGD) WITH PROPOFOL N/A 10/01/2012   Procedure: ESOPHAGOGASTRODUODENOSCOPY (EGD) WITH PROPOFOL;  Surgeon: Arta Silence, MD;  Location: [facility];  Service: Endoscopy;  Laterality: N/A;  . eustacean tubes    . EYE MUSCLE SURGERY    . LIVER TRANSPLANT      Review of systems negative except as noted in HPI / PMHx or noted below:  Review of Systems  Constitutional: Negative.   HENT: Negative.   Eyes: Negative.   Respiratory: Negative.   Cardiovascular: Negative.   Gastrointestinal: Negative.   Genitourinary: Negative.   Musculoskeletal: Negative.   Skin: Negative.   Neurological: Negative.    Endo/Heme/Allergies: Negative.   Psychiatric/Behavioral: Negative.      Objective:   Vitals:   08/13/17 1015  BP: 124/84  Pulse: 96  Resp: 20          Physical Exam  Constitutional: She is well-developed, well-nourished, and in no distress.  HENT:  Head: Normocephalic.  Right Ear: Tympanic membrane, external ear and ear canal normal.  Left Ear: Tympanic membrane, external ear and ear canal normal.  Nose: Nose normal. No mucosal edema or rhinorrhea.  Mouth/Throat: Uvula is midline, oropharynx is clear and moist and mucous membranes are normal. No oropharyngeal exudate.  Eyes: Conjunctivae  are normal.  Neck: Trachea normal. No tracheal tenderness present. No tracheal deviation present. No thyromegaly present.  Cardiovascular: Normal rate, regular rhythm, S1 normal, S2 normal and normal heart sounds.  No murmur heard. Pulmonary/Chest: Breath sounds normal. No stridor. No respiratory distress. She has no wheezes. She has no rales.  Musculoskeletal: She exhibits no edema.  Lymphadenopathy:       Head (right side): No tonsillar adenopathy present.       Head (left side): No tonsillar adenopathy present.    She has no cervical adenopathy.  Neurological: She is alert.  Skin: No rash noted. She is not diaphoretic. No erythema. Nails show no clubbing.  Psychiatric: Mood and affect normal.    Diagnostics:    Spirometry was performed and demonstrated an FEV1 of 1.95 at 92 % of predicted.  The patient had an Asthma Control Test with the following results: ACT Total Score: 18.    Assessment and Plan:   1. Asthma, moderate persistent, well-controlled   2. Other allergic rhinitis   3. Inflammatory dermatosis     1.  Continue Symbicort 160 2 inhalations twice a day  2. Continue montelukast 10 mg one tablet once a day  3. Continue nasal Azelastine 2 sprays each nostril twice a day  4. Continue cetirizine 10 mg one tablet once a day  5. Continue ProAir HFA 2 puffs every 4-6  hours if needed  6.  Continue Mometasone 0.1% ointment one-2 times a day if needed   7. Return to clinic in 6 months or earlier if problem  Overall Marshal has done quite well on her plan of anti-inflammatory medications for her respiratory tract and her hands and I see no need for changing her treatment at this point in time and I will assume she will continue to do well with this plan and see her back in this clinic in 6 months or earlier if there is a problem.  Allena Katz, MD Allergy / Immunology [place]

## 2017-08-13 NOTE — Patient Instructions (Signed)
  1.  Continue Symbicort 160 2 inhalations twice a day  2. Continue montelukast 10 mg one tablet once a day  3. Continue nasal Azelastine 2 sprays each nostril twice a day  4. Continue cetirizine 10 mg one tablet once a day  5. Continue ProAir HFA 2 puffs every 4-6 hours if needed  6.  Continue Mometasone 0.1% ointment one-2 times a day if needed   7. Return to clinic in 6 months or earlier if problem

## 2017-08-14 ENCOUNTER — Encounter: Payer: Self-pay | Admitting: Allergy and Immunology

## 2017-08-29 ENCOUNTER — Telehealth: Payer: Self-pay | Admitting: Family Medicine

## 2017-08-29 ENCOUNTER — Encounter: Payer: Self-pay | Admitting: Family Medicine

## 2017-08-29 ENCOUNTER — Ambulatory Visit (INDEPENDENT_AMBULATORY_CARE_PROVIDER_SITE_OTHER): Payer: Medicaid Other | Admitting: Internal Medicine

## 2017-08-29 VITALS — Ht 60.0 in | Wt 205.6 lb

## 2017-08-29 DIAGNOSIS — Z6841 Body Mass Index (BMI) 40.0 and over, adult: Secondary | ICD-10-CM | POA: Diagnosis not present

## 2017-08-29 NOTE — Progress Notes (Signed)
Learning Readiness: Change in progress  Usual eating pattern includes 3 meals and 1 snacks per day. Frequent foods and beverages include cream cheese     Usual physical activity includes Zumba dance exercise.  24-hr recall: (Up at 8 AM) B (10 AM)-  Blueberries (2 cups), baby carrots 18, tuna fish sandwich (white wheat bread x 2 slices, likely 5oz can of tuna fish , pickle raddish 2 tablespoons, 1 tablespoon mayonnaise),  Regular lemonade 12 oz x 1    L ( 1 PM)- baby carrots (20), 2 sticks of cellary, artificially sweetened Gatorade x 1 bottle unknown size, 1 apple  D ( 5PM)- fish  (breaded flounder 3 pieces (690Cal total, total carb 55g), sugar snap peas (20), cherry tomatoes (10 tomatoes), pumpkin pie ice cream (12 oz), fruit punch (12 oz, regular).  Typical day? No.; usually eats a snack at night (mini bag of pretzels with 2oz cream cheese). 1 bottle of water   Handouts given during visit include:  After-Visit Summary (AVS)   Goals:  1. Have no more than 1 cup (8oz) of sweet drinks at a time.  - remember to count any sweet drinks as a carb portion 2. Cut your 8oz cream cheese block into 8 equal pieces. Eat one piece (1oz) per serving.  3. Use the my plate picture to help you control your starchy food per serving.  - In other words, you want to have twice the volume of vegetables as protein or starch foods  Demonstrated degree of understanding via:  Teach Back  Barriers to learning/adherence to lifestyle change: Cognitive disability   Spent 50 minutes face to face time in counseling.  Follow up in Nutrition Clinic with Dr. Jenne Campus on May 16th at [facility] for 60 minute session

## 2017-08-29 NOTE — Patient Instructions (Addendum)
Goals:  1. have no more than 1 cup (8oz) of sweet drinks at a time.  - remember to count any sweet drinks as a carb portion 2. Cut your 8oz cream cheese block into 8 equal pieces. Eat one piece (1oz) per serving.  3. Use the my plate picture to help you control your starchy food per serving.  - In other words, you want to have twice the volume of vegetables as protein or starch foods   Follow up in Nutrition Clinic with Dr. Jenne Campus on May 16th at [facility].

## 2017-08-29 NOTE — Telephone Encounter (Signed)
Pt called wanting to check on the status of her FL2. Please call pt back.

## 2017-09-02 NOTE — Telephone Encounter (Signed)
Pt called again checking to see if her FL2 forms were completed. She would like to know when they have been. Please contact patient.

## 2017-09-03 NOTE — Telephone Encounter (Signed)
Pt calling again about her FL2 forms. She needs these faxed to her social worker by the 14th. Please advise

## 2017-09-04 ENCOUNTER — Encounter (HOSPITAL_COMMUNITY): Payer: Self-pay

## 2017-09-04 ENCOUNTER — Emergency Department (HOSPITAL_COMMUNITY): Payer: Medicaid Other

## 2017-09-04 ENCOUNTER — Emergency Department (HOSPITAL_COMMUNITY)
Admission: EM | Admit: 2017-09-04 | Discharge: 2017-09-04 | Disposition: A | Discharge status: Home / Self Care | Payer: Medicaid Other | Attending: Emergency Medicine | Admitting: Emergency Medicine

## 2017-09-04 ENCOUNTER — Ambulatory Visit: Payer: Self-pay

## 2017-09-04 ENCOUNTER — Other Ambulatory Visit: Payer: Self-pay

## 2017-09-04 DIAGNOSIS — Y999 Unspecified external cause status: Secondary | ICD-10-CM | POA: Insufficient documentation

## 2017-09-04 DIAGNOSIS — Z79899 Other long term (current) drug therapy: Secondary | ICD-10-CM | POA: Diagnosis not present

## 2017-09-04 DIAGNOSIS — Y929 Unspecified place or not applicable: Secondary | ICD-10-CM | POA: Insufficient documentation

## 2017-09-04 DIAGNOSIS — Y9301 Activity, walking, marching and hiking: Secondary | ICD-10-CM | POA: Diagnosis not present

## 2017-09-04 DIAGNOSIS — S0081XA Abrasion of other part of head, initial encounter: Secondary | ICD-10-CM | POA: Diagnosis present

## 2017-09-04 DIAGNOSIS — W19XXXA Unspecified fall, initial encounter: Secondary | ICD-10-CM

## 2017-09-04 DIAGNOSIS — W101XXA Fall (on)(from) sidewalk curb, initial encounter: Secondary | ICD-10-CM | POA: Diagnosis not present

## 2017-09-04 DIAGNOSIS — F79 Unspecified intellectual disabilities: Secondary | ICD-10-CM | POA: Insufficient documentation

## 2017-09-04 DIAGNOSIS — T148XXA Other injury of unspecified body region, initial encounter: Secondary | ICD-10-CM

## 2017-09-04 MED ORDER — TETANUS-DIPHTH-ACELL PERTUSSIS 5-2.5-18.5 LF-MCG/0.5 IM SUSP: 0.5000 mL | Freq: Once | INTRAMUSCULAR | Status: DC

## 2017-09-04 NOTE — Telephone Encounter (Signed)
Completed forms and left in fax pile. Please inform pt, thanks.

## 2017-09-04 NOTE — ED Triage Notes (Signed)
Patient BIB EMS- patient was on her way to the doctors office for an appointment and tripped and fell in the parking lot. Patient hit her head on the pavement- denies LOC. Laceration above left eye noted- bleeding controlled in triage. Patient AxOx4, has history of Intellectual Disability and seizures.

## 2017-09-04 NOTE — ED Provider Notes (Addendum)
[place] DEPT Provider Note   CSN: [id number] Arrival date & time: 09/04/17  1143     History   Chief Complaint Chief Complaint  Patient presents with  . Fall    HPI [name] is a 42 y.o. female with a history of MR as well as extensive past medical history as listed below who presents emergency department today for fall.  Patient was reportedly on her way to the doctor's office when she stepped off a curb, tripping and falling.  She reports that she hit her head on the the pavement.  Patient denies loss of consciousness.  No seizure-like activity reported or witnessed.  She reports that she has an abrasion above her left eyebrow.  No headache, nausea or vomiting, visual changes, vertigo, neck pain since the event.  No seizure-like activity.  Patient is not on any blood thinners.  Patient has not take anything for symptoms.  She denies any pain at this current time.  No numbness/tingling/weakness of the upper extremities, bowel/bladder incontinence or other arthralgias at this time. Patient unsure of tetanus status, however documented as UTD in patients records.   HPI  Past Medical History:  Diagnosis Date  . Abnormal vaginal bleeding   . Allergy   . Anemia   . Arthritis    right knee  . Asthma 09-23-12   Asthma somewhat causing increase wheezing and mild congestion at present  . Biliary cirrhosis ([place]) 04/26/11  . Constipation   . DUB (dysfunctional uterine bleeding)   . GERD (gastroesophageal reflux disease)   . Intellectual disability   . PONV (postoperative nausea and vomiting)   . 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 Seizures ([place])    congenital epilepsy-hx. seizures  . Vitamin D deficiency     Patient Active Problem List   Diagnosis Date Noted  . Orthostatic lightheadedness 04/29/2017  . Therapeutic drug monitoring 12/13/2016  . Sinusitis, acute 06/16/2015  . Impaired fasting blood sugar 01/24/2015  . Abnormality of  gait 10/15/2014  . Amenorrhea 06/28/2014  . Encounter for cervical Pap smear with pelvic exam 05/12/2014  . Lumbar back pain 04/06/2014  . Health care maintenance 03/06/2014  . H/O liver transplant ([place]) 11/23/2013  . Aftercare following organ transplant 07/28/2013  . 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 Coagulation disorder ([place]) 07/12/2013  . Thrombocytopenia ([place]) 03/09/2013  . Seizure disorder ([place]) 03/01/2013  . Schizophrenia, paranoid type ([place]) 06/25/2012    Class: Chronic  . 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 Vision loss, bilateral 03/30/2012  . Chest pain 01/03/2012  . 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 Unspecified vitamin D deficiency 09/13/2010  . Rectal bleeding 06/14/2010  . ANEMIA 03/03/2010  . GERD 07/21/2008  . 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 CARDIAC FLOW MURMUR 10/16/2007  . Convulsions/seizures ([place]) 08/23/2006  . Asthma with allergic rhinitis 07/30/2006  . Obesity 07/25/2006  . 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 Mental retardation 07/25/2006  . OSTEOARTHRITIS, LOWER LEG 07/25/2006    Past Surgical History:  Procedure Laterality Date  . adenonoidectomy    . APPENDECTOMY    . CHOLECYSTECTOMY    . COLONOSCOPY W/ BIOPSIES    . ESOPHAGOGASTRODUODENOSCOPY (EGD) WITH PROPOFOL N/A 10/01/2012   Procedure: ESOPHAGOGASTRODUODENOSCOPY (EGD) WITH PROPOFOL;  Surgeon: Arta Silence, MD;  Location: [facility] ENDOSCOPY;  Service: Endoscopy;  Laterality: N/A;  . eustacean tubes    . EYE MUSCLE SURGERY    . LIVER TRANSPLANT       OB History   None  Home Medications    Prior to Admission medications   Medication Sig Start Date End Date Taking? Authorizing Provider  ACCU-CHEK AVIVA PLUS test strip TEST once daily 02/13/17   Lovenia Kim, MD  ACCU-CHEK SOFTCLIX LANCETS lancets Use to check blood sugar once daily in the morning prior to eating. 02/13/17   Lovenia Kim, MD  acetaminophen (TYLENOL) 325 MG tablet Take 650 mg by mouth every 4 (four) hours as  needed for mild pain or headache.     [provider]  ammonium lactate (LAC-HYDRIN) 12 % cream Apply topically 2 (two) times daily.     [provider]  azelastine (ASTELIN) 0.1 % nasal spray Place 2 sprays into both nostrils 2 (two) times daily. Use in each nostril as directed 08/13/17   Kozlow, Donnamarie Poag, MD  benztropine (COGENTIN) 0.5 MG tablet Take 0.5 mg by mouth at bedtime.    [facility]  budesonide-formoterol (SYMBICORT) 160-4.5 MCG/ACT inhaler Inhale 2 puffs into the lungs 2 (two) times daily. 08/13/17   Kozlow, Donnamarie Poag, MD  carboxymethylcellulose (REFRESH TEARS) 0.5 % SOLN Place 1 drop into both eyes 4 (four) times daily as needed (for dry eyes).    [provider]  cetirizine (ZYRTEC) 10 MG tablet Take 1 tablet (10 mg total) by mouth daily. 08/13/17   Kozlow, Donnamarie Poag, MD  cycloSPORINE modified (NEORAL) 25 MG capsule Use in combination with 100 mg capsules for PM dose of 125 mg. 10/06/14   [provider]  cycloSPORINE modified 100 MG CAPS Take 100-125 mg by mouth 2 (two) times daily. Take 100 mg in the AM and 125 mg in the PM    [provider]  divalproex (DEPAKOTE ER) 500 MG 24 hr tablet Take 2 tablets (1,000 mg total) by mouth at bedtime. 12/13/16   Dennie Bible, NP  doxepin (SINEQUAN) 10 MG capsule Take 10 mg by mouth at bedtime.    [provider]  FIBER, GUAR GUM, PO Take by mouth.    [provider]  magnesium oxide (MAG-OX) 400 MG tablet Take 400 mg by mouth 2 (two) times daily. 5pm & 7pm. 10/12/13   Patrecia Pour, NP  mometasone (ELOCON) 0.1 % cream Apply 1 application topically 2 (two) times daily. 01/11/16   Kozlow, Donnamarie Poag, MD  montelukast (SINGULAIR) 10 MG tablet Take 1 tablet (10 mg total) by mouth at bedtime. 08/13/17   Kozlow, Donnamarie Poag, MD  Multiple Vitamin (MULTIVITAMIN) tablet Take 1 tablet by mouth daily. 9 am. 10/12/13   Patrecia Pour, NP  mycophenolate (CELLCEPT) 250 MG capsule Take 500 mg by mouth 2 (two) times  daily. 9 am & 9 pm. 10/12/13   Patrecia Pour, NP  omeprazole (PRILOSEC) 40 MG capsule Take 40 mg by mouth daily. 9 am. 10/12/13   Patrecia Pour, NP  polyethylene glycol [facility] / Floria Raveling) packet Take 17 g by mouth daily as needed (for constipation). In water or juice    [provider]  prazosin (MINIPRESS) 1 MG capsule Take 2 mg by mouth at bedtime.     [provider]  PROAIR HFA 108 (90 Base) MCG/ACT inhaler Inhale 1 puff into the lungs every 4 (four) hours as needed for shortness of breath. Patient not taking: Reported on 08/29/2017 08/13/17   Jiles Prows, MD  RA VITAMIN D-3 2000 units CAPS Take 1 capsule by mouth daily.  06/27/16   [provider]  risperiDONE (RISPERDAL) 3 MG tablet Take 3 mg by  mouth at bedtime.    [provider]    Family History Family History  Problem Relation Age of Onset  . Diabetes Mother   . Heart disease Mother   . COPD Mother   . Breast cancer Cousin     Social History Social History   Tobacco Use  . Smoking status: Never Smoker  . Smokeless tobacco: Never Used  Substance Use Topics  . Alcohol use: No    Alcohol/week: 0.0 oz  . Drug use: No     Allergies   Tramadol hcl; Aspirin; Ibuprofen; Penicillins; Pseudoephedrine; Sulfa antibiotics; and Latex   Review of Systems Review of Systems  All other systems reviewed and are negative.    Physical Exam Updated Vital Signs BP 136/89 (BP Location: Left Arm)   Pulse (!) 101   Temp 97.7 F (36.5 C) (Oral)   Resp 18   Ht 5' (1.524 m)   Wt 93 kg (205 lb)   SpO2 100%   BMI 40.04 kg/m   Physical Exam  Constitutional: She appears well-developed and well-nourished.  Non-toxic appearing  HENT:  Head: Normocephalic and atraumatic. Head is without raccoon's eyes and without Battle's sign.  Right Ear: Hearing, tympanic membrane, external ear and ear canal normal. Tympanic membrane is not perforated and not erythematous. No hemotympanum.  Left Ear:  Hearing, tympanic membrane, external ear and ear canal normal. Tympanic membrane is not perforated and not erythematous. No hemotympanum.  Nose: Nose normal. No rhinorrhea. Right sinus exhibits no maxillary sinus tenderness and no frontal sinus tenderness. Left sinus exhibits no maxillary sinus tenderness and no frontal sinus tenderness.  Mouth/Throat: Uvula is midline, oropharynx is clear and moist and mucous membranes are normal.  No CSF otorrhea.  No palpable open or depressed skull fracture.  Eyes: Pupils are equal, round, and reactive to light. Conjunctivae, EOM and lids are normal. Right eye exhibits no discharge. Left eye exhibits no discharge. Right conjunctiva is not injected. Left conjunctiva is not injected. No scleral icterus. Right eye exhibits normal extraocular motion and no nystagmus. Left eye exhibits normal extraocular motion and no nystagmus.  Neck: Trachea normal, normal range of motion, full passive range of motion without pain and phonation normal. Neck supple. No spinous process tenderness and no muscular tenderness present. No neck rigidity. No tracheal deviation and normal range of motion present.  No C-spine tenderness palpation or step-offs.  Normal range of motion.  Cardiovascular: Normal rate, regular rhythm, normal heart sounds and intact distal pulses.  Pulses:      Radial pulses are 2+ on the right side, and 2+ on the left side.       Dorsalis pedis pulses are 2+ on the right side, and 2+ on the left side.       Posterior tibial pulses are 2+ on the right side, and 2+ on the left side.  Pulmonary/Chest: Effort normal and breath sounds normal. No respiratory distress.  Neurological: She is alert. She has normal strength and normal reflexes. No cranial nerve deficit or sensory deficit. She displays a negative Romberg sign. Coordination and gait normal.  Reflex Scores:      Bicep reflexes are 2+ on the right side and 2+ on the left side.      Patellar reflexes are 2+ on  the right side and 2+ on the left side.      Achilles reflexes are 2+ on the right side and 2+ on the left side. Speech clear and at patients reported baselien. Follows  commands. No facial droop. Patient with baseline strabismus. Gross EOM intact. No obvious entrapment.  Patient with history of visual loss. Reports at baseline. CN III-XII grossly intact.  Grossly moves all extremities 4 without ataxia. Coordination intact. Able and appropriate strength for age to upper and lower extremities bilaterally including grip strength & plantar flexion/dorsiflexion. Sensation to light touch intact bilaterally for upper and lower. Patellar deep tendon reflex 2+ and equal bilaterally. Normal finger to nose. No pronator drift. Normal gait.   Skin: Skin is warm and dry. Capillary refill takes less than 2 seconds. Abrasion noted. She is not diaphoretic. No pallor.  2.63m x 0.25 cm linear abrasion above left eyebrow. Bleeding controlled. This appears to only involve epidermis. No FB visualized.   Psychiatric: She has a normal mood and affect.  Nursing note and vitals reviewed.    ED Treatments / Results  Labs (all labs ordered are listed, but only abnormal results are displayed) Labs Reviewed - No data to display  EKG None  Radiology Ct Head Wo Contrast  Result Date: 09/04/2017 CLINICAL DATA:  Tripped and fell in the parking lot going to the doctor's office, struck head on pavement, denies loss of consciousness, LEFT supraorbital laceration with bleeding, history of primary biliary cirrhosis, intellectual disability, seizures EXAM: CT HEAD WITHOUT CONTRAST CT MAXILLOFACIAL WITHOUT CONTRAST TECHNIQUE: Multidetector CT imaging of the head and maxillofacial structures were performed using the standard protocol without intravenous contrast. Multiplanar CT image reconstructions of the maxillofacial structures were also generated. COMPARISON:  CT head 08/23/2012 FINDINGS: CT HEAD FINDINGS Brain: Normal ventricular  morphology. No midline shift or mass effect. Normal appearance of brain parenchyma. No intracranial hemorrhage, mass lesion or evidence acute infarction. No extra-axial fluid collections. Vascular: No hyperdense vessels. Mild atherosclerotic calcifications at the carotid siphons. Skull: Intact Other: N/A CT MAXILLOFACIAL FINDINGS Osseous: Intact facial bones. No fractures identified. TMJ alignment normal bilaterally. Visualized portion of cervical spine unremarkable. Orbits: Intraorbital soft tissue planes clear Sinuses: Clear paranasal sinuses, mastoid air cells and middle ear cavities. Soft tissues: Unremarkable IMPRESSION: Normal CT head. Normal CT facial bones. Incidentally noted atherosclerotic calcifications at the carotid siphons. Electronically Signed   By: Lavonia Dana M.D.   On: 09/04/2017 13:32   Ct Maxillofacial Wo Cm  Result Date: 09/04/2017 CLINICAL DATA:  Tripped and fell in the parking lot going to the doctor's office, struck head on pavement, denies loss of consciousness, LEFT supraorbital laceration with bleeding, history of primary biliary cirrhosis, intellectual disability, seizures EXAM: CT HEAD WITHOUT CONTRAST CT MAXILLOFACIAL WITHOUT CONTRAST TECHNIQUE: Multidetector CT imaging of the head and maxillofacial structures were performed using the standard protocol without intravenous contrast. Multiplanar CT image reconstructions of the maxillofacial structures were also generated. COMPARISON:  CT head 08/23/2012 FINDINGS: CT HEAD FINDINGS Brain: Normal ventricular morphology. No midline shift or mass effect. Normal appearance of brain parenchyma. No intracranial hemorrhage, mass lesion or evidence acute infarction. No extra-axial fluid collections. Vascular: No hyperdense vessels. Mild atherosclerotic calcifications at the carotid siphons. Skull: Intact Other: N/A CT MAXILLOFACIAL FINDINGS Osseous: Intact facial bones. No fractures identified. TMJ alignment normal bilaterally. Visualized  portion of cervical spine unremarkable. Orbits: Intraorbital soft tissue planes clear Sinuses: Clear paranasal sinuses, mastoid air cells and middle ear cavities. Soft tissues: Unremarkable IMPRESSION: Normal CT head. Normal CT facial bones. Incidentally noted atherosclerotic calcifications at the carotid siphons. Electronically Signed   By: Lavonia Dana M.D.   On: 09/04/2017 13:32    Procedures Procedures (including critical care time)  Medications  Ordered in ED Medications - No data to display   Initial Impression / Assessment and Plan / ED Course  I have reviewed the triage vital signs and the nursing notes.  Pertinent labs & imaging results that were available during my care of the patient were reviewed by me and considered in my medical decision making (see chart for details).     42 year old female with a mechanical fall earlier today.  She reports head trauma.  No evidence of basilar skull fracture on exam.  She is at her neurologic baseline.   CT scan of the head and face without evidence of any acute findings.  Incidental finding as above.  She is also noted to have an abrasion above her left upper eyebrow.  This does not appear to need intervention or closure at this current time.  Discussed with patient good wound care.  No concern for cervical spine injury at this time.  Given patient's reassuring imaging and ability to walk in the emergency department feel patient is safe for discharge.  Strict return precautions discussed.  Conservative therapies discussed.  Patient appears safe for discharge.  Final Clinical Impressions(s) / ED Diagnoses   Final diagnoses:  Fall, initial encounter  Abrasion    ED Discharge Orders    None       Jillyn Ledger, PA-C 09/04/17 1352    Jillyn Ledger, PA-C 09/04/17 1356    Gareth Morgan, MD 09/04/17 2238

## 2017-09-04 NOTE — Discharge Instructions (Signed)
You were seen here today for fall. Your CT scans were reassuring. Please follow wound care instructions on how to care for your abrasion.  Return for any severe headache, visual changes, vomiting, difficulty with ambulation, spreading redness or discharge from your wound or any other concerning symptoms.

## 2017-09-05 ENCOUNTER — Encounter: Payer: Self-pay | Admitting: Family Medicine

## 2017-09-05 ENCOUNTER — Other Ambulatory Visit: Payer: Self-pay

## 2017-09-05 ENCOUNTER — Ambulatory Visit: Payer: Medicaid Other | Admitting: Family Medicine

## 2017-09-05 DIAGNOSIS — H543 Unqualified visual loss, both eyes: Secondary | ICD-10-CM | POA: Diagnosis not present

## 2017-09-05 DIAGNOSIS — W109XXD Fall (on) (from) unspecified stairs and steps, subsequent encounter: Secondary | ICD-10-CM

## 2017-09-05 NOTE — Patient Instructions (Signed)
Please see your eye doctor as scheduled. Keep your regular appointment with [place]. Unless you have new symptoms, you do not need to see [place] again for this fall.

## 2017-09-06 ENCOUNTER — Encounter: Payer: Self-pay | Admitting: Family Medicine

## 2017-09-06 DIAGNOSIS — W109XXD Fall (on) (from) unspecified stairs and steps, subsequent encounter: Secondary | ICD-10-CM | POA: Insufficient documentation

## 2017-09-06 NOTE — Progress Notes (Signed)
   Subjective:    Patient ID: [name], female    DOB: 1976-01-28, 42 y.o.   MRN: [id number]  HPI [name] fell, struck her left eyebrow and was seen in the ER.  No loss of conciousness: she just took a misstep.  Feels fine today.  Denies diplopia or visual changes - see PE below for it's importance.  States no change in vision.    Reviewed ER visit notes and normal CT scans.    Very nicely, patient has regularly scheduled eye exam in 10 days.  Note problem list contains "vision loss bilaterally"    Review of Systems     Objective:   Physical Exam   Minor abrasion over left superior/lateral eyebrow with underlying swelling.  No laceration or stiches.  No bony tenderness.  No conjuctival redness or hyphema.  Media clear.  She clearly has intermitant disconjugate gaze.  At rest, left eye seems focused with right eye having lateral deviation.  At times she can bring her eyes together.  Right eye seems unable to gaze medially. Alert and oriented.  Normal for her gait.  Again, no complaints of double vision even when eyes are clearly not alligned.        Assessment & Plan:

## 2017-09-06 NOTE — Assessment & Plan Note (Signed)
Minor contusion/abrasion of left eyebrow.  No ocular involvement by my exam.

## 2017-09-06 NOTE — Assessment & Plan Note (Signed)
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  I did my best to review records - and even found an old ophthalmology record - and could not confirm they are preexisting.    I want her seen by ophthalmology and nicely she already has an appointment.  No acute process, so I did not request urgent referral.

## 2017-10-01 ENCOUNTER — Encounter: Payer: Self-pay | Admitting: Family Medicine

## 2017-10-10 ENCOUNTER — Telehealth: Payer: Self-pay | Admitting: Family Medicine

## 2017-10-10 ENCOUNTER — Ambulatory Visit (INDEPENDENT_AMBULATORY_CARE_PROVIDER_SITE_OTHER): Payer: Medicaid Other | Admitting: Internal Medicine

## 2017-10-10 ENCOUNTER — Encounter: Payer: Self-pay | Admitting: Internal Medicine

## 2017-10-10 VITALS — Ht 60.0 in | Wt 205.6 lb

## 2017-10-10 DIAGNOSIS — Z6841 Body Mass Index (BMI) 40.0 and over, adult: Secondary | ICD-10-CM

## 2017-10-10 NOTE — Telephone Encounter (Signed)
Patient saw Dr. Emmaline Life today and there was a mention of an appt for 11/21/17 at 9 am but I could not put her in that slot and there was no actual appt already made.  I could not find Alisa.  Please call her and inform her what she needs to do, when is her appt.  thanks

## 2017-10-10 NOTE — Patient Instructions (Addendum)
1. You are doing a great job, keep it up  2. f you have a craving for a second dessert you can eat a yoghurt or fruit.  3. Please use to goal sheet as discussed    Goals:  1. Have no more than 1 cup (8oz) of sweet drinks at a time. - remember to count any sweet drinks as a carb portion 2. Limit desserts to twice weekly  3. Use twice the volume of vegetables as protein or starch foods for both lunch and supper - indicate on goal sheet   Next appointment at 9 AM on June 27th

## 2017-10-10 NOTE — Progress Notes (Signed)
Learning Readiness: Change in progress  Usual physical activity : Every day except Sunday; Dance exercise (in the AM for 40 ,minutes), Chair exercise (after lunch for 30 minutes), after dinner exercises (after dinner - started this two weeks, does about 20 situps/ 15 push ups)  24-hr recall: Wake Up-  Breakfast (9 AM): 1 tsp Peanut butter and 1 piece of toast, orange, water, 1/3 cup of cashews   Snack: None   Lunch (2:40 PM): 3 poppers (190 cal), 1 pear,  1 whole cucumber, 10 baby carrots, [place] orange juice 8 oz  Snack (3 PM): Skinny pop popcorn  (100 cal) Supper (4PM): 1tbp [place] cheese, 2 slices of bread, pork skins medium bag, Regular V8 (60 cals)  Snack (8 PM): 1 glazed donut + strawberry cheesecake jello (130 cal)  Typical day? Does not normal have pork rinds and glazed donut  Previous Goals  1. Have no more than 1 cup (8oz) of sweet drinks at a time.  - remember to count any sweet drinks as a carb portion 2. Cut your 8oz cream cheese block into 8 equal pieces. Eat one piece (1oz) per serving.  3. Use the my plate picture to help you control your starchy food per serving.  - In other words, you want to have twice the volume of vegetables as protein or starch foods   Handouts given during visit include:  After-Visit Summary (AVS)  Goals:  1. Have no more than 1 cup (8oz) of sweet drinks at a time. - remember to count any sweet drinks as a carb portion 2. Limit desserts to twice weekly  3. Use twice the volume of vegetables as protein or starch foods for both lunch and supper - indicate on goal sheet   Demonstrated degree of understanding via:  Teach Back  Barriers to learning/adherence to lifestyle change: Cognitive disability   Spent 50 minutes face to face time in counseling.  Follow up in Nutrition Clinic with Dr. Jenne Campus on June 27th at 9 AM

## 2017-10-10 NOTE — Telephone Encounter (Signed)
I saw this patient as part of Nutrition clinic. She is to be schedule with Dr. Jenne Campus for future appointments.

## 2017-10-28 ENCOUNTER — Other Ambulatory Visit: Payer: Self-pay

## 2017-10-28 ENCOUNTER — Encounter: Payer: Self-pay | Admitting: Internal Medicine

## 2017-10-28 ENCOUNTER — Ambulatory Visit (INDEPENDENT_AMBULATORY_CARE_PROVIDER_SITE_OTHER): Payer: Medicaid Other | Admitting: Internal Medicine

## 2017-10-28 VITALS — BP 112/78 | HR 94 | Temp 98.3°F | Wt 197.8 lb

## 2017-10-28 DIAGNOSIS — R112 Nausea with vomiting, unspecified: Secondary | ICD-10-CM | POA: Diagnosis present

## 2017-10-28 NOTE — Assessment & Plan Note (Signed)
Vomiting and diarrhea now resolved Likely gastroenteritis Follow-up as needed

## 2017-10-28 NOTE — Patient Instructions (Signed)
Happy that you are doing better.  You probably had a viral infection.  Continue to maintain good fluid intake.

## 2017-10-28 NOTE — Progress Notes (Signed)
   [name] Kerrin Mo, MD Phone: [phone]  Reason For Visit: SDA for Vomiting   #Vomiting Patient states that on Thursday she had vomiting-she vomited 4-5 times. She also noted having some diarrhea at the time.  She did have fish and pork grinds for dinner the day before and she did go to a Bible class which other people could have been sick at.  She state that Saturday she was feeling completely better.  She denies any symptoms currently. She denies any nausea or vomiting.  Denies any fever or chills.    Past Medical History Reviewed problem list.  Medications- reviewed and updated No additions to family history Social history- patient is a non- smoker  Objective: BP 112/78   Pulse 94   Temp 98.3 F (36.8 C) (Oral)   Wt 197 lb 12.8 oz (89.7 kg)   SpO2 99%   BMI 38.63 kg/m  Gen: NAD, alert, cooperative with exam Cardio: regular rate and rhythm, S1S2 heard, no murmurs appreciated Pulm: clear to auscultation bilaterally, no wheezes, rhonchi or rales GI: soft, non-tender, non-distended, bowel sounds present, no hepatomegaly, no splenomegaly Skin: dry, intact, no rashes or lesions   Assessment/Plan: See problem based a/p  Nausea and vomiting Vomiting and diarrhea now resolved Likely gastroenteritis Follow-up as needed

## 2017-10-31 ENCOUNTER — Ambulatory Visit
Admission: RE | Admit: 2017-10-31 | Discharge: 2017-10-31 | Disposition: A | Discharge status: Home / Self Care | Payer: Medicaid Other | Source: Ambulatory Visit | Attending: *Deleted | Admitting: *Deleted

## 2017-10-31 DIAGNOSIS — Z1231 Encounter for screening mammogram for malignant neoplasm of breast: Secondary | ICD-10-CM

## 2017-11-12 ENCOUNTER — Other Ambulatory Visit: Payer: Self-pay

## 2017-11-12 MED ORDER — ACCU-CHEK AVIVA PLUS VI STRP
ORAL_STRIP | 3 refills | Status: DC
Start: 2017-11-12 — End: 2018-06-24

## 2017-11-20 ENCOUNTER — Other Ambulatory Visit: Payer: Self-pay | Admitting: Nurse Practitioner

## 2017-11-21 ENCOUNTER — Ambulatory Visit: Payer: Self-pay | Admitting: Family Medicine

## 2017-12-05 ENCOUNTER — Ambulatory Visit: Payer: Medicaid Other | Admitting: Family Medicine

## 2017-12-05 ENCOUNTER — Encounter: Payer: Self-pay | Admitting: Family Medicine

## 2017-12-05 NOTE — Patient Instructions (Signed)
Thank you for coming in to see [name] today. Please see below to review our plan for today's visit.  1. Achieve average CBG of 115 2. Have no more than 1 cup (8oz) of sweet drinks at a time. - remember to count any sweet drinks as a carb portion 3. Limit desserts to twice weekly  4. Use twice the volume of vegetables as protein or starch foods for both lunch and supper - indicate on goal sheet   Please call the clinic at [phone] if your symptoms worsen or you have any concerns. It was our pleasure to serve you.  Harriet [place], DO & Dr. Jenne Campus

## 2017-12-05 NOTE — Progress Notes (Signed)
Learning Readiness: Change in progress  Usual physical activity : Every day except Sunday; Dance exercise (in the AM for 40 ,minutes), Chair exercise (after lunch for 30 minutes), after dinner exercises (after dinner - started this two weeks, does about 20 situps/ 15 push ups)  24-hr recall: Wake Up (4 AM) Breakfast (5 AM): 2 small sausage patties, egg beaters 1/3 cups, 1 bottle water  Snack: Belvita bar x1, 1 bottle water Lunch (12 PM): 1/2 watermelon, pickle-size cucumber x2, baby carrots x15, bottle diet green tea Snack (3 PM): [place] x1 peanut butter and chocolate, 1 bottle water Supper (4 PM): 2 pieces pre made battered flounder small fillets, celery sticks x2, baby carrots x10, 2 tablespoons fat-free ranch, 1 cup orange juice Snack (7 PM): 1 pear  Typical day? Normal, nothing different  Previous Goals  1. Have no more than 1 cup (8oz) of sweet drinks at a time. - remember to count any sweet drinks as a carb portion 2. Limit desserts to twice weekly  3. Use twice the volume of vegetables as protein or starch foods for both lunch and supper - indicate on goal sheet    Handouts given during visit include:  After-Visit Summary (AVS)  Goals:  1. Achieve average CBG of 115 2. Have no more than 1 cup (8oz) of sweet drinks at a time. - remember to count any sweet drinks as a carb portion 3. Limit desserts to twice weekly  4. Use twice the volume of vegetables as protein or starch foods for both lunch and supper - indicate on goal sheet   Demonstrated degree of understanding via:  Yes  Barriers to learning/adherence to lifestyle change: Cognitive disability   Spent 50 minutes face to face time in counseling.  Follow up in Nutrition Clinic with Dr. Jenne Campus on September 5th at 9 AM (60 minutes)  Harriet Butte, [place], PGY-3

## 2017-12-16 ENCOUNTER — Encounter: Payer: Self-pay | Admitting: Family Medicine

## 2017-12-16 NOTE — Progress Notes (Signed)
[facility]  PATIENT: [name] DOB: 17-Jul-1975   REASON FOR VISIT: Follow-up for seizure disorder HISTORY FROM: Patient    HISTORY OF PRESENT ILLNESS:HISTORY 03/23/14 [name]): [name], 42 year old black female returns today for followup of epilepsy. her uncle is [name], He is the power of attorney for [name]. She has a history of mild mental retardation, and long-standing history of seizures. She was well-controlled on Depakote and Tegretol extended release. But she has developed liver disease, was diagnosed with primary biliary cirrhosis,Antieptical medication was changed since February 2013, she is now taking Vimpat 100 mg 2 tabs twice a day, Keppra 500 mg 3 tabs daily. She has no significant seizure activities while taking Vimpat, and Keppra.She lives alone, uncles lives next door, she attends adult daycare regularly.She underwent liver transplantation in February 2015, doing very well, on polypharmacy treatment, this including CellCept 1000 mg twice a day, Prograft 4 mg twice a day,For her seizure, she is now taking deapkote ER 250mg  5 tabs qhs, she is having seizure about once amonth, it usually happens at nighttime, she was noted to have nighttime extremity jumping movement, tongue biting when she woke up, she also complains of mild gait difficullty,   UPDATE Oct 27th 2015:I have talked with [name] liver transplant cordinator, reported, 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, at [facility], Keppra, Vimpat cause worsening behavior issue, she had a hallucinations, paranoid to the point of almost institutionalized, her mood disorder eventually was under control with Depakote ER 250 mg 5 tablets each day, [name] continues to complains that she had a 2 episode of waking up with blood in her mouth, has bitten her tongue, one in July, the second one was September 2015, overall she is tolerating the medication well, Depakote level was 63 in February 23 2014, EEG was  normal.Repeat laboratory evaluation in March 09 2014, showed decreased white count 1.6, she is receiving weekly laboratory evaluation by [name] transplant team, I have called coordinator again, is aware of her leukopenia, she is on lower dose of immunosuppressive treatment, now taking cyclosporine 200 mg twice a day, CellCept 250 mg twice a day,She has personal aid every evening  UPDATE December 08 2015:yy She is brought in by [name] today, I reviewed and summarized her to visit in January 2017, "Patient's current immunosuppression regimen is Cyclosporine 100/125mg  and Mycophenolate 500MG  BID". She has no clinical features concerning for complications of immunosuppressive drug therapy,  She is now taking Depakote DR 250mg  4 tabs qhs, she reported one episode of left hand shaking in July 2017.    Laboratory evaluation in January 2017, Depakote level 78, CBC showed mild anemia, hemoglobin 9.6, normal CMP, Update 01/16/2018CM. [name], 42 year old female returns for follow-up for seizure disorder. She has a history of liver transplant in 2015. She is currently on Depakote extended release 500 mg 2 at bedtime. She reports no seizure activity in several years. She continues to live alone, uncle lives next door. She has no new neurologic complaints. Labs from [facility] reviewed from 12/21/2015. She has not had a recent VPA level. She returns for reevaluation. UPDATE 07/09/2018CM [name], 42 year old female returns for follow-up for her history of seizure disorder. She has not had any seizure activity in several years .  She had a history of liver transplant at [facility] in 2015. She remains on Depakote 500 mg 2 tablets at bedtime. She continues to live alone family member last next door. She had recent labs at  [name] CMP however results are not in the computer. She had recent CBC on 10/30/16 which is stable. She is currently being evaluated for chest pain by Dr. Tamala Julian , she will have a myocardial  perfusion test on Tuesday . She returns for reevaluation UPDATE 7/23/2019CM [name], 42 year old female returns for follow-up with history of seizure disorder.  She has not had any seizures in several years, at least 3.  She has a history of liver transplant at [facility] in 2015.  She is currently on Depakote 500 mg 2 tablets at bedtime.  Reviewed recent CBC and CMP from 11/19/2017.  Patient continues to live alone however a relative lives in the right next door.  She has had no interval medical issues she claims.  She returns for reevaluation REVIEW OF SYSTEMS: Full 14 system review of systems performed and notable only for those listed, all others are neg:  Constitutional: neg  Cardiovascular: Chest pain Ear/Nose/Throat: neg  Skin: neg Eyes: neg Respiratory: neg Gastroitestinal: neg  Hematology/Lymphatic: neg  Endocrine: neg Musculoskeletal:neg Allergy/Immunology: neg Neurological: History of seizure disorder Psychiatric: neg Sleep : neg   ALLERGIES: Allergies  Allergen Reactions  . Tramadol Hcl Itching and Nausea And Vomiting    Causes SEIZURES  . Aspirin Nausea And Vomiting    Due to Stomach ulcer  . Ibuprofen Other (See Comments)    Due to stomach ulcer  . Penicillins Itching, Swelling and Rash    Breakout  Has patient had a PCN reaction causing immediate rash, facial/tongue/throat swelling, SOB or lightheadedness with hypotension: Yes Has patient had a PCN reaction causing severe rash involving mucus membranes or skin necrosis: Yes Has patient had a PCN reaction that required hospitalization: Yes Has patient had a PCN reaction occurring within the last 10 years: No If all of the above answers are "NO", then may proceed with Cephalosporin use.     . Pseudoephedrine Itching and Swelling    Tongue swelling  . Sulfa Antibiotics Nausea And Vomiting  . Latex Itching and Rash    HOME MEDICATIONS: Outpatient Medications Prior to Visit  Medication Sig Dispense Refill  .  ammonium lactate (LAC-HYDRIN) 12 % cream Apply topically 2 (two) times daily.     Marland Kitchen azelastine (ASTELIN) 0.1 % nasal spray Place 2 sprays into both nostrils 2 (two) times daily. Use in each nostril as directed 30 mL 5  . benztropine (COGENTIN) 0.5 MG tablet Take 0.5 mg by mouth at bedtime.    . budesonide-formoterol (SYMBICORT) 160-4.5 MCG/ACT inhaler Inhale 2 puffs into the lungs 2 (two) times daily. 1 Inhaler 5  . carboxymethylcellulose (REFRESH TEARS) 0.5 % SOLN Place 1 drop into both eyes 4 (four) times daily as needed (for dry eyes).    . cetirizine (ZYRTEC) 10 MG tablet Take 1 tablet (10 mg total) by mouth daily. 30 tablet 5  . cycloSPORINE modified (NEORAL) 25 MG capsule Use in combination with 100 mg capsules for PM dose of 125 mg.  1  . cycloSPORINE modified 100 MG CAPS Take 100-125 mg by mouth 2 (two) times daily. Take 100 mg in the AM and 125 mg in the PM    . divalproex (DEPAKOTE ER) 500 MG 24 hr tablet TAKE 2 TABLETS BY MOUTH AT BEDTIME 180 tablet 3  . doxepin (SINEQUAN) 10 MG capsule Take 10 mg by mouth at bedtime.    Marland Kitchen FIBER, GUAR GUM, PO Take by mouth.    . hydrocortisone valerate cream (WESTCORT) 0.2 % APPLY 1 APPLICATION TOPICALLY TO  AFFECTED AREAS ON HANDS AND FEET BID PRN FOR DERMATITIS  2  . magnesium oxide (MAG-OX) 400 MG tablet Take 400 mg by mouth 2 (two) times daily. 5pm & 7pm.    . mometasone (ELOCON) 0.1 % cream Apply 1 application topically 2 (two) times daily. 45 g 5  . montelukast (SINGULAIR) 10 MG tablet Take 1 tablet (10 mg total) by mouth at bedtime. 30 tablet 5  . Multiple Vitamin (MULTIVITAMIN) tablet Take 1 tablet by mouth daily. 9 am.    . mycophenolate (CELLCEPT) 250 MG capsule Take 500 mg by mouth 2 (two) times daily. 9 am & 9 pm.    . omeprazole (PRILOSEC) 40 MG capsule Take 40 mg by mouth daily. 9 am.    . polyethylene glycol (MIRALAX / GLYCOLAX) packet Take 17 g by mouth daily as needed (for constipation). In water or juice    . prazosin (MINIPRESS) 1 MG  capsule Take 2 mg by mouth at bedtime.     Marland Kitchen PROAIR HFA 108 (90 Base) MCG/ACT inhaler Inhale 1 puff into the lungs every 4 (four) hours as needed for shortness of breath. 1 Inhaler 1  . RA VITAMIN D-3 2000 units CAPS Take 1 capsule by mouth daily.   0  . risperiDONE (RISPERDAL) 3 MG tablet Take 3 mg by mouth at bedtime.    . hydrocortisone valerate cream (WESTCORT) 0.2 % Apply topically to affected areas hands and feet twice daily as needed for dermatitis.    Marland Kitchen ACCU-CHEK AVIVA PLUS test strip TEST once daily (Patient not taking: Reported on 12/17/2017) 100 each 3  . ACCU-CHEK SOFTCLIX LANCETS lancets Use to check blood sugar once daily in the morning prior to eating. (Patient not taking: Reported on 12/17/2017) 100 each 2  . triamcinolone cream (KENALOG) 0.1 % Apply 1 application topically once a week.  2   No facility-administered medications prior to visit.     PAST MEDICAL HISTORY: Past Medical History:  Diagnosis Date  . Abnormal vaginal bleeding   . Allergy   . Anemia   . Arthritis    right knee  . Asthma 09-23-12   Asthma somewhat causing increase wheezing and mild congestion at present  . Biliary cirrhosis ([place]) 04/26/11  . Constipation   . DUB (dysfunctional uterine bleeding)   . GERD (gastroesophageal reflux disease)   . Intellectual disability   . PONV (postoperative nausea and vomiting)   . Primary biliary cirrhosis ([place]) 05/07/2011  . Seizures ([place])    congenital epilepsy-hx. seizures  . Vitamin D deficiency     PAST SURGICAL HISTORY: Past Surgical History:  Procedure Laterality Date  . adenonoidectomy    . APPENDECTOMY    . CHOLECYSTECTOMY    . COLONOSCOPY W/ BIOPSIES    . ESOPHAGOGASTRODUODENOSCOPY (EGD) WITH PROPOFOL N/A 10/01/2012   Procedure: ESOPHAGOGASTRODUODENOSCOPY (EGD) WITH PROPOFOL;  Surgeon: Arta Silence, MD;  Location: [facility];  Service: Endoscopy;  Laterality: N/A;  . eustacean tubes    . EYE MUSCLE SURGERY    . LIVER TRANSPLANT      FAMILY  HISTORY: Family History  Problem Relation Age of Onset  . Diabetes Mother   . Heart disease Mother   . COPD Mother   . Breast cancer Cousin     SOCIAL HISTORY: Social History   Socioeconomic History  . Marital status: Single    Spouse name: Not on file  . Number of children: 0  . Years of education: 37 th  . Highest education level: Not  on file  Occupational History    Comment: Disabled  Social Needs  . Financial resource strain: Not on file  . Food insecurity:    Worry: Not on file    Inability: Not on file  . Transportation needs:    Medical: Not on file    Non-medical: Not on file  Tobacco Use  . Smoking status: Never Smoker  . Smokeless tobacco: Never Used  Substance and Sexual Activity  . Alcohol use: No    Alcohol/week: 0.0 oz  . Drug use: No  . Sexual activity: Never  Lifestyle  . Physical activity:    Days per week: Not on file    Minutes per session: Not on file  . Stress: Not on file  Relationships  . Social connections:    Talks on phone: Not on file    Gets together: Not on file    Attends religious service: Not on file    Active member of club or organization: Not on file    Attends meetings of clubs or organizations: Not on file    Relationship status: Not on file  . Intimate partner violence:    Fear of current or ex partner: Not on file    Emotionally abused: Not on file    Physically abused: Not on file    Forced sexual activity: Not on file  Other Topics Concern  . Not on file  Social History Narrative   Lives alone but uncle and his wife live next door and are her primary caregivers. Sonia Side (uncle) can be reached at [phone].    Education High school education.   Disabled.   Right handed.   Caffeine tea and coffee daily.         Current Social History   (Please include date ( . td) when updating information )      Who lives at home: lives  alone 07/05/2016    Transportation: uses Medicaid transportation to and from appointments  07/05/2016   Important Relationships & Pets: Lynnda Shields lives next door 07/05/2016    Current Stressors: Person Care aid will stop this month 07/05/2016   Work / Education:  Receives disability 07/05/2016   Religious / Personal Beliefs: has strong religious beliefs 07/05/2016   Interests / Fun: Enjoys going to bible study  07/05/2016   Other: enjoys reading her bible 07/05/2016                                                                                                           PHYSICAL EXAM  Vitals:   12/17/17 0932  BP: 120/75  Pulse: 96  Weight: 202 lb 6.4 oz (91.8 kg)  Height: 5' (1.524 m)   Body mass index is 39.53 kg/m. Generalized: Well developed, Obese female in no acute distress   Neurological examination  Mentation: Alert oriented to time, place, history taking. Follows all commands speech and language fluent Cranial nerve II-XII: Pupils were equal round reactive to light. Extraocular movements were full, visual field were full on confrontational test. Left exotropia Facial  sensation and strength were normal. Uvula tongue midline. Head turning and shoulder shrug  were normal and symmetric. Motor: The motor testing reveals 5 over 5 strength of all 4 extremities. Good symmetric motor tone is noted throughout.  Sensory: Sensory testing is intact to soft touch on all 4 extremities. No evidence of extinction is noted.  Coordination: Cerebellar testing reveals good finger-nose-finger and heel-to-shin bilaterally.  Gait and station: Gait is normal. Tandem gait is normal. Romberg is negative. No drift is seen.  Reflexes: Deep tendon reflexes are symmetric and normal bilaterally.   DIAGNOSTIC DATA (LABS, IMAGING, TESTING) - I reviewed patient records, labs, notes, testing and imaging myself where available.  Lab Results  Component Value Date   WBC 3.2 (L) 10/30/2016   HGB 10.1 (A) 04/29/2017   HCT 29.8 (L) 10/30/2016   MCV 80 10/30/2016   PLT 130 (L) 10/30/2016      Component  Value Date/Time   NA 140 06/09/2015 0957   K 4.9 06/09/2015 0957   CL 104 06/09/2015 0957   CO2 22 06/09/2015 0957   GLUCOSE 103 (H) 06/09/2015 0957   GLUCOSE 104 (H) 06/28/2014 0932   BUN 23 06/09/2015 0957   CREATININE 0.79 06/09/2015 0957   CREATININE 0.83 06/28/2014 0932   CALCIUM 8.9 06/09/2015 0957   CALCIUM 9.0 09/11/2007 2301   PROT 6.9 06/09/2015 0957   ALBUMIN 3.9 06/09/2015 0957   AST 11 06/09/2015 0957   ALT 7 06/09/2015 0957   ALKPHOS 63 06/09/2015 0957   BILITOT 0.3 06/09/2015 0957   GFRNONAA 93 06/09/2015 0957   GFRNONAA >89 11/09/2011 1208   GFRAA 108 06/09/2015 0957   GFRAA >89 11/09/2011 1208    Lab Results  Component Value Date   HGBA1C 5.3 03/29/2016    Lab Results  Component Value Date   TSH 5.870 (H) 10/30/2016      ASSESSMENT AND PLAN  42 y.o. year old female  has a past medical history of  Biliary cirrhosis ([place]) (04/26/11); Liver transplant 2015  Intellectual disability; (05/07/2011); Seizures ([place]); and Vitamin D deficiency. here to follow-up for her seizure disorder.  No seizure activity in several years  PLAN: Continue Depakote 500 mg extended release 2 tablets daily will refill Follow-up in 1 year Call for any seizure activity Reviewed recent labs CBC , CMP from 11/19/17 Dennie Bible, [facility], [facility], [facility] [address], [place], [place] [address] [phone]

## 2017-12-17 ENCOUNTER — Encounter: Payer: Self-pay | Admitting: Nurse Practitioner

## 2017-12-17 ENCOUNTER — Ambulatory Visit: Payer: Medicaid Other | Admitting: Nurse Practitioner

## 2017-12-17 VITALS — BP 120/75 | HR 96 | Ht 60.0 in | Wt 202.4 lb

## 2017-12-17 DIAGNOSIS — R569 Unspecified convulsions: Secondary | ICD-10-CM

## 2017-12-17 DIAGNOSIS — R6250 Unspecified lack of expected normal physiological development in childhood: Secondary | ICD-10-CM

## 2017-12-17 MED ORDER — DIVALPROEX SODIUM ER 500 MG PO TB24: 1000.0000 mg | ORAL_TABLET | Freq: Every day | ORAL | 3 refills | Status: DC

## 2017-12-17 NOTE — Patient Instructions (Signed)
Continue Depakote 500 mg extended release 2 tablets daily will refill Follow-up in 1 year Call for any seizure activity Reviewed recent labs CBC , CMP from 11/19/17

## 2017-12-17 NOTE — Progress Notes (Signed)
I have reviewed and agreed above plan. 

## 2017-12-30 ENCOUNTER — Encounter: Payer: Self-pay | Admitting: Family Medicine

## 2018-01-30 ENCOUNTER — Ambulatory Visit: Payer: Self-pay | Admitting: Family Medicine

## 2018-02-05 ENCOUNTER — Other Ambulatory Visit: Payer: Self-pay | Admitting: Allergy and Immunology

## 2018-02-18 ENCOUNTER — Ambulatory Visit: Payer: Medicaid Other | Admitting: Allergy and Immunology

## 2018-03-05 ENCOUNTER — Other Ambulatory Visit: Payer: Self-pay | Admitting: Allergy and Immunology

## 2018-03-05 ENCOUNTER — Other Ambulatory Visit: Payer: Self-pay | Admitting: Allergy & Immunology

## 2018-03-07 ENCOUNTER — Encounter: Payer: Medicaid Other | Admitting: Family Medicine

## 2018-03-11 ENCOUNTER — Telehealth: Payer: Self-pay | Admitting: Family Medicine

## 2018-03-13 ENCOUNTER — Encounter: Payer: Self-pay | Admitting: Family Medicine

## 2018-03-13 ENCOUNTER — Ambulatory Visit: Payer: Medicaid Other | Admitting: Family Medicine

## 2018-03-13 ENCOUNTER — Ambulatory Visit: Payer: Medicaid Other

## 2018-03-13 ENCOUNTER — Telehealth: Payer: Self-pay | Admitting: Allergy and Immunology

## 2018-03-13 VITALS — Ht 60.0 in | Wt 203.4 lb

## 2018-03-13 DIAGNOSIS — Z6839 Body mass index (BMI) 39.0-39.9, adult: Secondary | ICD-10-CM

## 2018-03-13 DIAGNOSIS — Z23 Encounter for immunization: Secondary | ICD-10-CM

## 2018-03-13 DIAGNOSIS — E6609 Other obesity due to excess calories: Secondary | ICD-10-CM | POA: Diagnosis not present

## 2018-03-13 MED ORDER — MONTELUKAST SODIUM 10 MG PO TABS: 10.0000 mg | ORAL_TABLET | Freq: Every day | ORAL | 0 refills | Status: DC

## 2018-03-13 NOTE — Telephone Encounter (Signed)
Patient needs a refill on her singulair sent to [facility] on [address]

## 2018-03-13 NOTE — Telephone Encounter (Signed)
Courtesy refill until appointment 11/21.

## 2018-03-13 NOTE — Patient Instructions (Addendum)
It was a pleasure to see you today! Thank you for choosing [facility] for your primary care. [name] was seen for Nutrition Clinic.   Goals:  1. Achieve average CBG of 115 2. Have no more than 1 cup (8oz) of sweet tasting drinks at a time (including diet drinks). 3. Limit desserts to twice weekly  4. Use twice the volume of vegetables as protein or starch foods for both lunch and supper - indicate on goal sheet   Best,  Marny Lowenstein, MD, [place] - PGY2 03/13/2018 9:45 AM

## 2018-03-13 NOTE — Progress Notes (Signed)
Learning Readiness: Change is in process  Usual physical activity :  Patient reported. Questionable historian.  - Zumba in AM wake up 08:00 - 10:00AM before breakfast and PM after dinner 17:00 - 20:00PM. This is done at home. Done 6 days a week. Doing it 5 months. Watches video on laptop - Chair exercises/stretching: 2000 - 2100.   24-hr recall  (Up at 0400 AM) B ( 10AM)- Sausage patty x1. Bacon strips x3. Orange Juice 8 oz. Water 36 oz. Cinnamon Toast Crunch Cereal (Dry) 1 cup.  Snk ( 11AM)- Pistachios 0.75 cup (unshelled). Diet Green Peach Tea x1 bottle.  L ( 1PM)- Grilled Cheese Sandwich x1 on Wheat Bread. Brussel sprouts (fresh)x2 cups. Teaspoon of olive oil. Diet Juice x3 cups.  Snk ( 1:30PM)- Protein bar (Peanut butter/chocolate chip) x1. Bottle water 16 oz x1 D ( 5PM)- Stir-fried cabbage 4 cups. Brussel sprouts x2 cups. Pork w/ soy patty 3-4 oz x1. Diet Lemonade 8 oz x1.  Snk ( 7PM)- Grapesx2 cups.. Diet pineapple juice x12 oz.  Typical day? No.   Previous Goals  1. Have no more than 1 cup (8oz) of sweet drinks at a time. - remember to count any sweet drinks as a carb portion 2. Limit desserts to twice weekly  3. Use twice the volume of vegetables as protein or starch foods for both lunch and supper - indicate on goal sheet   Handouts given during visit include:  After-Visit Summary (AVS)  Goals:  1. Record fasting blood sugar daily (goal ~115) 2. Have no more than 1 cup (8oz) of sweet tasting drinks at a time (including diet drinks). 3. Limit desserts to twice weekly  4. Use twice the volume of vegetables as protein or starch foods for both lunch and supper - indicate on goal sheet    Demonstrated degree of understanding via:  Yes  Barriers to learning/adherence to lifestyle change: Cognitive disability   Spent 50 minutes face to face time in counseling.  Follow up in Nutrition Clinic with Dr. Jenne Campus on November 14th at 10 AM (71 minutes)  Marny Lowenstein, MD, [place] - PGY2 03/13/2018 9:03 AM

## 2018-03-21 ENCOUNTER — Ambulatory Visit: Payer: Self-pay

## 2018-04-03 ENCOUNTER — Telehealth: Payer: Self-pay | Admitting: Family Medicine

## 2018-04-03 ENCOUNTER — Other Ambulatory Visit: Payer: Self-pay | Admitting: Family Medicine

## 2018-04-03 DIAGNOSIS — L309 Dermatitis, unspecified: Secondary | ICD-10-CM

## 2018-04-03 NOTE — Telephone Encounter (Signed)
Pt would like to know if Dr. Reesa Chew could send in a derm referral for her. Pt said the doctor she sees at [facility] was going to refer her but she does not want to travel that far. She would like to be seen in an office in [place].

## 2018-04-03 NOTE — Telephone Encounter (Signed)
Sure, I looked at her chart for records from [facility] and I see she was seen for hand and foot dermatitis as well as seborrheic keratosis.  I have placed the referral for her and specified a provider in [place].

## 2018-04-08 ENCOUNTER — Ambulatory Visit: Payer: Medicaid Other | Admitting: Allergy and Immunology

## 2018-04-09 ENCOUNTER — Other Ambulatory Visit: Payer: Self-pay | Admitting: Allergy and Immunology

## 2018-04-10 ENCOUNTER — Ambulatory Visit: Payer: Self-pay | Admitting: Family Medicine

## 2018-04-14 ENCOUNTER — Ambulatory Visit: Payer: Self-pay | Admitting: Family Medicine

## 2018-04-16 ENCOUNTER — Other Ambulatory Visit: Payer: Self-pay | Admitting: *Deleted

## 2018-04-16 ENCOUNTER — Telehealth: Payer: Self-pay | Admitting: Allergy and Immunology

## 2018-04-16 MED ORDER — MONTELUKAST SODIUM 10 MG PO TABS: 10.0000 mg | ORAL_TABLET | Freq: Every day | ORAL | 1 refills | Status: DC

## 2018-04-16 MED ORDER — CETIRIZINE HCL 10 MG PO TABS: 10.0000 mg | ORAL_TABLET | Freq: Every day | ORAL | 1 refills | Status: DC

## 2018-04-16 NOTE — Telephone Encounter (Signed)
Pt called and needs to have Cetirizine and Singulair called into [facility] on [address] [phone].

## 2018-04-16 NOTE — Telephone Encounter (Signed)
Called patient and informed that a refills will be sent to get the patient through until their appointment in January, at their appointment they can receive more refills. Patient verbalized understanding. Refills have been sent in.

## 2018-04-28 ENCOUNTER — Other Ambulatory Visit: Payer: Self-pay

## 2018-04-28 ENCOUNTER — Ambulatory Visit (INDEPENDENT_AMBULATORY_CARE_PROVIDER_SITE_OTHER): Payer: Medicaid Other | Admitting: Family Medicine

## 2018-04-28 ENCOUNTER — Encounter: Payer: Self-pay | Admitting: Family Medicine

## 2018-04-28 DIAGNOSIS — Z0000 Encounter for general adult medical examination without abnormal findings: Secondary | ICD-10-CM | POA: Diagnosis not present

## 2018-04-28 LAB — POCT GLYCOSYLATED HEMOGLOBIN (HGB A1C): HEMOGLOBIN A1C: 5.1 % (ref 4.0–5.6)

## 2018-04-28 NOTE — Patient Instructions (Signed)
It was nice seeing you again today.  You were seen in clinic for routine physical and are doing great.  We checked your hemoglobin A1c which shows your blood sugar control over the last 3 months.  I will let you know the results once they return.  Please call clinic if you have any questions.  Be well, Lovenia Kim MD

## 2018-04-28 NOTE — Assessment & Plan Note (Addendum)
Continues to follow with Dr. Jenne Campus, nutrition, last seen Oct 2019.   Recommend cutting down on juice intake and continue checking her blood sugars.  Check A1c today, last was 5.3 in 2017.

## 2018-04-28 NOTE — Progress Notes (Signed)
   Subjective:   Patient ID: [name]    DOB: Oct 09, 1975, 42 y.o. female   MRN: [id number]  CC: routine physical   HPI: [name] is a 42 y.o. female who presents to clinic today for routine physical.  Patient denies current issues, feels she is doing well overall.  She presents for routine physical and is up to date with her health maintenance. Has been trying to lose weight and is following with Dr. Jenne Campus in nutrition clinic.  She has been limiting her intake of sweet drinks and doing Zumba.      ROS: No fever, chills, nausea, vomiting.  No abdominal pain, SOB, CP.    Social: pt is a never smoker.  Medications reviewed.   Objective:   BP 124/82   Pulse 97   Temp 97.9 F (36.6 C) (Oral)   Ht 5' (1.524 m)   Wt 204 lb 6.4 oz (92.7 kg)   SpO2 98%   BMI 39.92 kg/m  Vitals and nursing note reviewed.  General: pleasant 42 yo female, NAD  HEENT: NCAT, EOMI, PERRL, MMM, oropharynx clear Neck: supple  CV: RRR no MRG  Lungs: CTAB, normal effort  Abdomen: soft, NTND, +bs  Skin: warm, dry, no rash Extremities: warm and well perfused Neuro: alert, oriented x3, no focal deficits   Assessment & Plan:   Morbid obesity ([place]) Continues to follow with Dr. Jenne Campus, nutrition, last seen Oct 2019.   Recommend cutting down on juice intake and continue checking her blood sugars.  Check A1c today, last was 5.3 in 2017.   Health maintenance: -Up-to-date  Orders Placed This Encounter  Procedures  . POCT glycosylated hemoglobin (Hb A1C)   Lovenia Kim, MD [place]

## 2018-05-07 NOTE — Telephone Encounter (Signed)
Spoke with pt

## 2018-06-03 ENCOUNTER — Ambulatory Visit: Payer: Medicaid Other | Admitting: Allergy and Immunology

## 2018-06-05 ENCOUNTER — Telehealth: Payer: Self-pay

## 2018-06-05 NOTE — Telephone Encounter (Signed)
Patient called nurse line. Needs new glucometer, Accu-Chek Aviva Plus, sent to pharmacy.  Danley Danker, RN [facility] RN)

## 2018-06-09 ENCOUNTER — Other Ambulatory Visit: Payer: Self-pay | Admitting: Family Medicine

## 2018-06-09 MED ORDER — ACCU-CHEK AVIVA PLUS W/DEVICE KIT: 1.0000 | PACK | Freq: Two times a day (BID) | 0 refills | Status: DC

## 2018-06-09 NOTE — Telephone Encounter (Signed)
Called patient at listed phone number for home and uncle answered and said he was her caregiver.  Did not speak to him on issue because I did not see his name in her chart.  He gave her number which is [phone].  Made aware of new RX at pharmacy.  Patient verbalized understanding.  [name], [place]

## 2018-06-09 NOTE — Telephone Encounter (Signed)
I have sent a new device in for her to the [facility] on [address], please let her know thanks!

## 2018-06-12 ENCOUNTER — Other Ambulatory Visit: Payer: Self-pay | Admitting: Allergy and Immunology

## 2018-06-13 ENCOUNTER — Telehealth: Payer: Self-pay | Admitting: Family Medicine

## 2018-06-13 NOTE — Telephone Encounter (Signed)
Pt called and she is requesting for a referral for home aid. Please call her back at [phone]. ad

## 2018-06-23 ENCOUNTER — Other Ambulatory Visit: Payer: Self-pay | Admitting: Family Medicine

## 2018-06-23 DIAGNOSIS — W19XXXS Unspecified fall, sequela: Secondary | ICD-10-CM

## 2018-06-23 NOTE — Telephone Encounter (Signed)
I have placed a referral for home health aid for her.

## 2018-06-24 ENCOUNTER — Telehealth: Payer: Self-pay

## 2018-06-24 MED ORDER — GLUCOSE BLOOD VI STRP: ORAL_STRIP | 12 refills | Status: DC

## 2018-06-24 MED ORDER — ACCU-CHEK GUIDE ME W/DEVICE KIT: 1.0000 | PACK | Freq: Every day | 0 refills | Status: DC

## 2018-06-24 NOTE — Telephone Encounter (Signed)
Fax from pharmacy that Medicaid requires Accu-Chek Guide, asks for new Rx for both meter and strips.  Sent.  Danley Danker, RN [facility] RN)

## 2018-07-01 ENCOUNTER — Telehealth: Payer: Self-pay

## 2018-07-01 NOTE — Telephone Encounter (Signed)
Pt calls nurse line stating she was unable to pick up her diabetic supplies, the accu chek guide, from the pharmacy.   I call the pharmacy to clarify- per pharmacist, the medication is ringing up 30 dollars and "not free." Therefore, patient left it.

## 2018-07-03 ENCOUNTER — Ambulatory Visit: Payer: Medicaid Other | Admitting: Family Medicine

## 2018-07-03 NOTE — Telephone Encounter (Signed)
Spoke with AccuChek service with [facility] at  [phone].  Was given the BIN, PCN, Group, and ID number for free meter BIN. They all match what is on the preferred list except ID number given was [id number]. Was advised to have pharmacy call pharmacy help desk at [phone] if still having problems running.  Left message with above info for [facility] pharmacy.  Danley Danker, RN [facility] RN)

## 2018-07-08 ENCOUNTER — Encounter: Payer: Self-pay | Admitting: Allergy and Immunology

## 2018-07-08 ENCOUNTER — Ambulatory Visit: Payer: Medicaid Other | Admitting: Allergy and Immunology

## 2018-07-08 VITALS — BP 102/66 | HR 115 | Resp 16 | Ht 61.0 in | Wt 209.0 lb

## 2018-07-08 DIAGNOSIS — J4540 Moderate persistent asthma, uncomplicated: Secondary | ICD-10-CM

## 2018-07-08 DIAGNOSIS — D849 Immunodeficiency, unspecified: Secondary | ICD-10-CM

## 2018-07-08 DIAGNOSIS — D899 Disorder involving the immune mechanism, unspecified: Secondary | ICD-10-CM

## 2018-07-08 DIAGNOSIS — L308 Other specified dermatitis: Secondary | ICD-10-CM | POA: Diagnosis not present

## 2018-07-08 DIAGNOSIS — J3089 Other allergic rhinitis: Secondary | ICD-10-CM

## 2018-07-08 DIAGNOSIS — L989 Disorder of the skin and subcutaneous tissue, unspecified: Secondary | ICD-10-CM

## 2018-07-08 MED ORDER — BUDESONIDE-FORMOTEROL FUMARATE 160-4.5 MCG/ACT IN AERO: 2.0000 | INHALATION_SPRAY | Freq: Two times a day (BID) | RESPIRATORY_TRACT | 5 refills | Status: DC

## 2018-07-08 MED ORDER — AZELASTINE HCL 0.1 % NA SOLN: 2.0000 | Freq: Two times a day (BID) | NASAL | 5 refills | Status: DC

## 2018-07-08 MED ORDER — PROAIR HFA 108 (90 BASE) MCG/ACT IN AERS: 1.0000 | INHALATION_SPRAY | RESPIRATORY_TRACT | 1 refills | Status: DC | PRN

## 2018-07-08 MED ORDER — MOMETASONE FUROATE 0.1 % EX CREA: 1.0000 "application " | TOPICAL_CREAM | Freq: Two times a day (BID) | CUTANEOUS | 5 refills | Status: DC

## 2018-07-08 NOTE — Patient Instructions (Addendum)
  1.  Continue Symbicort 160 2 inhalations twice a day  2. Continue montelukast 10 mg one tablet once a day  3. Continue nasal Azelastine 2 sprays each nostril twice a day  4. Continue cetirizine 10 mg one tablet once a day  5. Continue ProAir HFA 2 puffs every 4-6 hours if needed  6. Continue Mometasone 0.1% ointment 1-2 times a day if needed   7. For this recent episode use the following:   A. Prednisone 10mg  - 2 tablet daily for 5 days only.   8. Return to clinic in 6 months or earlier if problem

## 2018-07-08 NOTE — Progress Notes (Signed)
Follow-up Note  Referring Provider: [name], MD Primary Provider: Lovenia Kim, MD Date of Office Visit: 07/08/2018  Subjective:   [name] (DOB: [birth year]) is a 43 y.o. 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  I have not seen her in this clinic since 13 August 2017.  Overall her asthma has really done very well during the interval.  She rarely uses any short acting bronchodilator.  She has not required a systemic steroid or an antibiotic to treat any type of respiratory tract issue.  However, last week she started to develop cough at nighttime.  She is short of breath when she exerts herself.  She has been using her bronchodilator twice a day for the past week.  She does not have any fever or ugly nasal discharge or other nasal symptoms and she does not have any symptoms to suggest reflux.  She does not have any chest pain or sputum production.  Overall her nose is doing relatively well at this point.  She visited with a dermatologist last year for her hand dermatitis and was given hydrocortisone 0.2% which has not worked as well as her mometasone.  She would like to go back on mometasone.  Her use of mometasone is usually 1 week on topical treatment and then 2 weeks off topical treatment mostly to her hands and occasionally her feet.  She did receive the flu vaccine this year.  She continues on immunosuppression with cyclosporine for her liver transplant.  Allergies as of 07/08/2018      Reactions   Tramadol Hcl Itching, Nausea And Vomiting   Causes SEIZURES   Aspirin Nausea And Vomiting   Due to Stomach ulcer   Ibuprofen Other (See Comments)   Due to stomach ulcer   Penicillins Itching, Swelling, Rash   Breakout    Has patient had a PCN reaction causing immediate rash, facial/tongue/throat swelling, SOB or lightheadedness with hypotension: Yes Has patient had a PCN reaction causing severe rash involving mucus membranes or skin necrosis: Yes Has patient had a PCN reaction that required hospitalization: Yes Has patient had a PCN reaction occurring within the last 10 years: No If all of the above answers are "NO", then may proceed with Cephalosporin use.     Pseudoephedrine Itching, Swelling   Tongue swelling   Sulfa Antibiotics Nausea And Vomiting   Latex Itching, Rash      Medication List      ACCU-CHEK GUIDE ME w/Device Kit 1 each by Does not apply route daily.   ACCU-CHEK SOFTCLIX LANCETS lancets Use to check blood sugar once daily in the morning prior to eating.   azelastine 0.1 % nasal spray Commonly known as:  ASTELIN Place 2 sprays into both nostrils 2 (two) times daily. Use in each nostril as directed   benztropine 0.5 MG tablet Commonly known as:  COGENTIN Take 0.5 mg by mouth at bedtime.   budesonide-formoterol 160-4.5 MCG/ACT inhaler Commonly known as:  SYMBICORT Inhale 2 puffs into the lungs 2 (two) times daily.   cetirizine 10 MG tablet Commonly known as:  ZYRTEC TAKE 1 TABLET BY MOUTH EVERY DAY   cycloSPORINE modified 100 MG Caps Take 100-125 mg by mouth 2 (two) times daily. Take 100 mg in the AM and 125 mg  in the PM   cycloSPORINE modified 25 MG capsule Commonly known as:  NEORAL Use in combination with 100 mg capsules for PM dose of 125 mg.   divalproex 500 MG 24 hr tablet Commonly known as:  DEPAKOTE ER Take 2 tablets (1,000 mg total) by mouth at bedtime.   doxepin 10 MG capsule Commonly known as:  SINEQUAN Take 25 mg by mouth at bedtime.   FIBER (GUAR GUM) PO Take by mouth.   glucose blood test strip Commonly known as:  ACCU-CHEK GUIDE Use to check blood sugar once daily.   hydrocortisone valerate cream 0.2 % Commonly known as:  WESTCORT APPLY 1  APPLICATION TOPICALLY TO AFFECTED AREAS ON HANDS AND FEET BID PRN FOR DERMATITIS   LAC-HYDRIN 12 % cream Generic drug:  ammonium lactate Apply topically 2 (two) times daily.   magnesium oxide 400 MG tablet Commonly known as:  MAG-OX Take 400 mg by mouth 2 (two) times daily. 5pm & 7pm.   mometasone 0.1 % cream Commonly known as:  ELOCON Apply 1 application topically 2 (two) times daily.   montelukast 10 MG tablet Commonly known as:  SINGULAIR TAKE 1 TABLET BY MOUTH AT BEDTIME   multivitamin tablet Take 1 tablet by mouth daily. 9 am.   mycophenolate 250 MG capsule Commonly known as:  CELLCEPT Take 500 mg by mouth 2 (two) times daily. 9 am & 9 pm.   omeprazole 40 MG capsule Commonly known as:  PRILOSEC Take 40 mg by mouth daily. 9 am.   polyethylene glycol packet Commonly known as:  MIRALAX / GLYCOLAX Take 17 g by mouth daily as needed (for constipation). In water or juice   prazosin 1 MG capsule Commonly known as:  MINIPRESS Take 2 mg by mouth at bedtime.   PROAIR HFA 108 (90 Base) MCG/ACT inhaler Generic drug:  albuterol Inhale 1 puff into the lungs every 4 (four) hours as needed for shortness of breath.   RA VITAMIN D-3 50 MCG (2000 UT) Caps Generic drug:  Cholecalciferol Take 1 capsule by mouth daily.   REFRESH TEARS 0.5 % Soln Generic drug:  carboxymethylcellulose Place 1 drop into both eyes 4 (four) times daily as needed (for dry eyes).   risperiDONE 3 MG tablet Commonly known as:  RISPERDAL Take 3 mg by mouth at bedtime.   triamcinolone cream 0.1 % Commonly known as:  KENALOG Apply 1 application topically once a week.       Past Medical History:  Diagnosis Date  . Abnormal vaginal bleeding   . Allergy   . Anemia   . Arthritis    right knee  . Asthma 09-23-12   Asthma somewhat causing increase wheezing and mild congestion at present  . Biliary cirrhosis ([place]) 04/26/11  . Constipation   . DUB (dysfunctional uterine bleeding)   . GERD  (gastroesophageal reflux disease)   . Intellectual disability   . PONV (postoperative nausea and vomiting)   . 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 Seizures ([place])    congenital epilepsy-hx. seizures  . Vitamin D deficiency     Past Surgical History:  Procedure Laterality Date  . adenonoidectomy    . APPENDECTOMY    . CHOLECYSTECTOMY    . COLONOSCOPY W/ BIOPSIES    . ESOPHAGOGASTRODUODENOSCOPY (EGD) WITH PROPOFOL N/A 10/01/2012   Procedure: ESOPHAGOGASTRODUODENOSCOPY (EGD) WITH PROPOFOL;  Surgeon: Arta Silence, MD;  Location: [facility];  Service: Endoscopy;  Laterality: N/A;  . eustacean tubes    . EYE MUSCLE SURGERY    .  LIVER TRANSPLANT      Review of systems negative except as noted in HPI / PMHx or noted below:  Review of Systems  Constitutional: Negative.   HENT: Negative.   Eyes: Negative.   Respiratory: Negative.   Cardiovascular: Negative.   Gastrointestinal: Negative.   Genitourinary: Negative.   Musculoskeletal: Negative.   Skin: Negative.   Neurological: Negative.   Endo/Heme/Allergies: Negative.   Psychiatric/Behavioral: Negative.      Objective:   Vitals:   07/08/18 1053  BP: 102/66  Pulse: (!) 115  Resp: 16  SpO2: 97%   Height: '5\' 1"'$  (154.9 cm)  Weight: 209 lb (94.8 kg)   Physical Exam Constitutional:      Appearance: She is not diaphoretic.  HENT:     Head: Normocephalic.     Right Ear: Tympanic membrane, ear canal and external ear normal.     Left Ear: Tympanic membrane, ear canal and external ear normal.     Nose: Nose normal. No mucosal edema or rhinorrhea.     Mouth/Throat:     Pharynx: Uvula midline. No oropharyngeal exudate.  Eyes:     Conjunctiva/sclera: Conjunctivae normal.  Neck:     Thyroid: No thyromegaly.     Trachea: Trachea normal. No tracheal tenderness or tracheal deviation.  Cardiovascular:     Rate and Rhythm: Normal rate and regular rhythm.     Heart sounds: Normal heart sounds, S1 normal and S2 normal.  No murmur.  Pulmonary:     Effort: No respiratory distress.     Breath sounds: Normal breath sounds. No stridor. No wheezing or rales.  Lymphadenopathy:     Head:     Right side of head: No tonsillar adenopathy.     Left side of head: No tonsillar adenopathy.     Cervical: No cervical adenopathy.  Skin:    Findings: No erythema or rash.     Nails: There is no clubbing.   Neurological:     Mental Status: She is alert.     Diagnostics:    Spirometry was performed and demonstrated an FEV1 of 1.80 at 79 % of predicted.  The patient had an Asthma Control Test with the following results: ACT Total Score: 11.    Assessment and Plan:   1. Not well controlled moderate persistent asthma   2. Other allergic rhinitis   3. Inflammatory dermatosis   4. Immunosuppression ([place])     1.  Continue Symbicort 160 2 inhalations twice a day  2. Continue montelukast 10 mg one tablet once a day  3. Continue nasal Azelastine 2 sprays each nostril twice a day  4. Continue cetirizine 10 mg one tablet once a day  5. Continue ProAir HFA 2 puffs every 4-6 hours if needed  6. Continue Mometasone 0.1% ointment 1-2 times a day if needed   7. For this recent episode use the following:   A. Prednisone '10mg'$  - 2 tablet daily for 5 days only.   8. Return to clinic in 6 months or earlier if problem   Overall Isys has done very well with her respiratory and cutaneous inflammation but over the course of the past week she probably contracted a viral respiratory tract infection which is giving rise to inflammation of her lower airway for which we will treat her with a relatively low dose and short course of systemic steroids as noted above.  She will remain on anti-inflammatory therapy as noted above for both her airway and scan and I will  see her back in this clinic in 6 months or earlier if there is a problem.  Allena Katz, MD Allergy / Immunology [place]

## 2018-07-09 ENCOUNTER — Encounter: Payer: Self-pay | Admitting: Allergy and Immunology

## 2018-07-11 NOTE — Telephone Encounter (Signed)
Pt called again stating she is still not able to pick up her meter.   I called the pharmacy to see of Alisas message was received. Pharmacy tech stated they have tried everything, coupons, and the information Alisa gave, and medicaid has even called. For whatever reason the medication is not being approved "free." The pharmacy tech stated another tech has been working diligently on this and "knows more about it," and to call back on Monday, as she is off today. Will route to RN team.

## 2018-07-14 NOTE — Telephone Encounter (Signed)
Are you or your students able to help with this? I am completely out of ideas. Accu-Chek guide is what is preferred and the problem seems to be on the pharmacy end.   Danley Danker, RN [facility] RN)

## 2018-07-16 ENCOUNTER — Other Ambulatory Visit: Payer: Self-pay

## 2018-07-16 MED ORDER — GLUCOSE BLOOD VI STRP: ORAL_STRIP | 12 refills | Status: DC

## 2018-07-16 MED ORDER — ACCU-CHEK GUIDE W/DEVICE KIT: 1.0000 | PACK | Freq: Every day | 0 refills | Status: DC

## 2018-07-16 NOTE — Telephone Encounter (Signed)
Will send Accu-Chek Guide and strips to a different pharmacy to see if it will go through. Patient asks for it to be sent to [facility].  Danley Danker, RN [facility] RN)

## 2018-07-17 ENCOUNTER — Ambulatory Visit: Payer: Medicaid Other | Admitting: Family Medicine

## 2018-07-25 ENCOUNTER — Ambulatory Visit: Payer: Medicaid Other | Admitting: Family Medicine

## 2018-07-25 ENCOUNTER — Other Ambulatory Visit: Payer: Self-pay

## 2018-07-25 ENCOUNTER — Ambulatory Visit: Payer: Self-pay | Admitting: Family Medicine

## 2018-07-25 ENCOUNTER — Telehealth: Payer: Self-pay | Admitting: *Deleted

## 2018-07-25 VITALS — BP 128/84 | HR 102 | Temp 98.2°F | Ht 61.0 in | Wt 213.0 lb

## 2018-07-25 DIAGNOSIS — R109 Unspecified abdominal pain: Secondary | ICD-10-CM

## 2018-07-25 MED ORDER — DICLOFENAC SODIUM 1 % TD GEL: 4.0000 g | Freq: Four times a day (QID) | TRANSDERMAL | 0 refills | Status: DC

## 2018-07-25 NOTE — Telephone Encounter (Signed)
Completed PA info in [place] for diclofenac gel. Voltaren is preferred but this is no longer manufactured. Status pending.  Will recheck status in 24 hours. Jervis Trapani, Salome Spotted, CMA

## 2018-07-25 NOTE — Telephone Encounter (Signed)
Prior approval for diclofenac gel completed via [employer].  Med approved for 07/25/2018 - 08/24/2018 pharmacy informed.  Fleeger, Salome Spotted, CMA

## 2018-07-25 NOTE — Assessment & Plan Note (Signed)
Pain exclusively limited to large transverse abdominal scar, secondary to liver transplant.  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  Try Voltaren gel apply to 3 times per day as needed for burning at scar site.  Can also do Tylenol, limit to maximum 2 g/day given the retransplant.

## 2018-07-25 NOTE — Progress Notes (Signed)
   HPI 43 year old African-American female who presents for abdominal pain.  States that this pain for started back in November.  Has been happening off and on since then.  Pain is limited to her large transverse abdominal scar from her liver transplant.  Transplant occurred back in 2015.  She describes the pain as a burning over the entire course of her surgical scar.  Patient states that she did have a little bit of nausea on 07/20/2018.  This instance of her burning started on 2/22.  She has had no other symptoms, and no other episodes of nausea or vomiting.  CC: Incision site pain   ROS:   Review of Systems See HPI for ROS.   CC, SH/smoking status, and VS noted  Objective: BP 128/84 (BP Location: Right Arm, Patient Position: Sitting, Cuff Size: Large)   Pulse (!) 102   Temp 98.2 F (36.8 C) (Oral)   Ht 5\' 1"  (1.549 m)   Wt 213 lb (96.6 kg)   SpO2 99%   BMI 40.25 kg/m  Gen: 43 year old African female, no acute stress, resting comfortably CV: RRR, no murmur Resp: CTAB, no wheezes, non-labored Abd: Soft, nontender, nondistended.  No tenderness to palpation on exam, patient endorses burning sensation across length of surgical scar.  Scar extends from bilateral clavicular lines. Neuro: Alert and oriented, Speech clear, No gross deficits   Assessment and plan:  Abdominal pain Pain exclusively limited to large transverse abdominal scar, secondary to liver transplant.  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  Try Voltaren gel apply to 3 times per day as needed for burning at scar site.  Can also do Tylenol, limit to maximum 2 g/day given the retransplant.   No orders of the defined types were placed in this encounter.   Meds ordered this encounter  Medications  . diclofenac sodium (VOLTAREN) 1 % GEL    Sig: Apply 4 g topically 4 (four) times daily.    Dispense:  100 g    Refill:  0     Guadalupe Dawn MD PGY-2 Family Medicine Resident  2/45/2020 2:05 PM

## 2018-07-25 NOTE — Telephone Encounter (Signed)
Noted, thanks for the [facility]

## 2018-07-25 NOTE — Patient Instructions (Signed)
It was great meeting you today!  I am sorry your scar tissue has been hurting you.  Fortunately this is a common complication of large abdominal surgery.  The incision site has a limited ability to regenerate the sensation nerves on her skin.  This can lead to an intermittent burning sensation along the scar tissue.  This fits very well with your symptoms.  For this pain I will send in a prescription for Voltaren gel, it is a topical anti-inflammatory medication.  He can continue to take small doses of the Tylenol.  The symptoms do not improve with the gel please come back and let us know.  If you develop symptoms similar to when he first needed transplant, nausea vomiting, or any other alarming symptoms to let us know.

## 2018-08-13 ENCOUNTER — Telehealth: Payer: Self-pay | Admitting: Family Medicine

## 2018-08-13 DIAGNOSIS — H6692 Otitis media, unspecified, left ear: Secondary | ICD-10-CM

## 2018-08-13 NOTE — Telephone Encounter (Signed)
Called patient to discuss nature of her visit.  She states she does not have any particular concerns at this time and was coming in for a routine follow up.  I informed her it may be best to reschedule visit to next month given current coronavirus situation.  She is agreeable to this and will reschedule with front office.

## 2018-08-13 NOTE — Telephone Encounter (Signed)
Pt calling to see if Dr. Reesa Chew still wants to see her for her appt tomorrow (08/14/2018). Told pt she should be hearing from Dr. Reesa Chew sometime this afternoon on whether or not she is to come in tomorrow or not. Please call pt.

## 2018-08-14 ENCOUNTER — Ambulatory Visit: Payer: Self-pay | Admitting: Family Medicine

## 2018-08-14 ENCOUNTER — Ambulatory Visit: Payer: Medicaid Other | Admitting: Family Medicine

## 2018-08-22 ENCOUNTER — Other Ambulatory Visit: Payer: Self-pay

## 2018-08-22 ENCOUNTER — Telehealth (INDEPENDENT_AMBULATORY_CARE_PROVIDER_SITE_OTHER): Payer: Medicaid Other | Admitting: Family Medicine

## 2018-08-22 DIAGNOSIS — H6692 Otitis media, unspecified, left ear: Secondary | ICD-10-CM | POA: Diagnosis not present

## 2018-08-22 DIAGNOSIS — J0110 Acute frontal sinusitis, unspecified: Secondary | ICD-10-CM

## 2018-08-22 DIAGNOSIS — R42 Dizziness and giddiness: Secondary | ICD-10-CM

## 2018-08-22 MED ORDER — AZITHROMYCIN 250 MG PO TABS: ORAL_TABLET | ORAL | 0 refills | Status: DC

## 2018-08-22 NOTE — Assessment & Plan Note (Addendum)
Endorsed nasal congestion. Likely exacerbating left AOM. Recommend nasal decongestants (oral and nasal spray), nasal saline spray, and humidifiers. Patient to continue allergy meds as prescribed.

## 2018-08-22 NOTE — Progress Notes (Signed)
[place] Telemedicine Visit  Patient consented to have visit conducted via telephone.  Encounter participants: Patient: [name]  Provider: Danna Hefty  Others (if applicable): None  Chief Complaint: Dizziness  HPI: Left ear started stopped up on Friday (08/15/18) and then started hearing a "sizzeling sound" on Wednesday (08/20/18) that comes and goes. Has history of "fluid in ears". Also notes a history of vertigo that self resolved. Feels entire left side is stopped up and is starting to experience "soreness" along the entire left side. Woke up this morning and when getting out of bed, stood up and "started rocking back in forth", feeling lightheaded and woozy and dizzy. Notes had a an ear infection years ago. Denies any fevers or chills, SOB, nausea, vomiting, constipation, diarrhea. Endorses coughing at night that is relieved with rescue inhaler. Denies any eye pain or eye discharge. Denies any dizziness/lightheadedness when changing positions, only when she stands up. Eating and drinking normally. Denies any redness or swelling around left ear. Right ear feels completely normal. Does endorse some nasal congestion, treating with Azelastine nasal spray.   ROS: See HPI  Pertinent PMHx: Asthma, allergies, orthostatic hypotension  Assessment/Plan:  Acute otitis media, left Given duration of symptoms and new onset pain, will treat for acute otitis media.  Given allergies, will treat with Z-pack x 5 days.  Recommended nasal decongestants, nasal saline spray, humidifier.  Return precautions discussed.  Patient understood and agreed.   Orthostatic lightheadedness Dizziness upon standing most consistent with orthostatic lightheadedness. Given patient has a history of this, recommend slow rising to standing position. However, given current suspected AOM, this could be exacerbating the symptoms. Expect some improvement upon treatment.   Sinusitis,  acute Endorsed nasal congestion. Likely exacerbating left AOM. Recommend nasal decongestants (oral and nasal spray), nasal saline spray, and humidifiers. Patient to continue allergy meds as prescribed.   Time spent on phone with patient: 20 minutes

## 2018-08-22 NOTE — Assessment & Plan Note (Signed)
Dizziness upon standing most consistent with orthostatic lightheadedness. Given patient has a history of this, recommend slow rising to standing position. However, given current suspected AOM, this could be exacerbating the symptoms. Expect some improvement upon treatment.

## 2018-08-22 NOTE — Assessment & Plan Note (Signed)
Given duration of symptoms and new onset pain, will treat for acute otitis media.  Given allergies, will treat with Z-pack x 5 days.  Recommended nasal decongestants, nasal saline spray, humidifier.  Return precautions discussed.  Patient understood and agreed.

## 2018-08-26 ENCOUNTER — Telehealth: Payer: Self-pay | Admitting: Family Medicine

## 2018-08-26 NOTE — Telephone Encounter (Signed)
Patient called stating that she was prescribed med's for her ear but they aren't working. Please give patient a call back.

## 2018-08-29 ENCOUNTER — Telehealth (INDEPENDENT_AMBULATORY_CARE_PROVIDER_SITE_OTHER): Payer: Medicaid Other | Admitting: Family Medicine

## 2018-08-29 ENCOUNTER — Other Ambulatory Visit: Payer: Self-pay

## 2018-08-29 DIAGNOSIS — H6122 Impacted cerumen, left ear: Secondary | ICD-10-CM | POA: Diagnosis not present

## 2018-08-29 MED ORDER — CARBAMIDE PEROXIDE 6.5 % OT SOLN: 5.0000 [drp] | Freq: Two times a day (BID) | OTIC | 0 refills | Status: DC

## 2018-08-29 NOTE — Progress Notes (Signed)
[place] Telemedicine Visit  Patient consented to have visit conducted via telephone.  Encounter participants: Patient: [name]  Provider: Chrisandra Netters  Others (if applicable): n/a  Chief Complaint: ear stopped up  HPI:  Did telemedicine visit 1 week ago for ear pain and dizziness, prescribed azithromycin which helped some. No longer has pain in ear, but is having sensation of L ear being stopped up. Hears a sizzling sound still. Feels mildly lightheaded with standing. Has coughed on and off, using albuterol inhaler which helps. Overall she is improving but is bothered by her ear being stopped up. She thinks there may be wax in her ear canal. No fever.  ROS: + cough, no fever  Pertinent PMHx: history of primary biliary cirrhosis, history of liver transplant, schizophrenia, GERD, morbid obesity, mental retardation  Exam:  Respiratory: Patient speaking normally in full sentences throughout the phone encounter, without any respiratory distress evident.  Psych: speech somewhat slowed (consistent with dx of mental retardation)  Assessment/Plan:  Ear stopped up History somewhat limited by patient's cognitive impairment, but sounds consistent with possible cerumen impaction. Will rx debrox drops. Instructed patient to call back if not improving next week, she may need to come in for an office visit to assess her ear in person (though would judiciously screen her for COVID symptoms prior to coming into the office since she endorses cough now).  Time spent on phone with patient: 6 minutes

## 2018-09-23 ENCOUNTER — Telehealth (INDEPENDENT_AMBULATORY_CARE_PROVIDER_SITE_OTHER): Payer: Medicaid Other | Admitting: Family Medicine

## 2018-09-23 DIAGNOSIS — R51 Headache: Secondary | ICD-10-CM | POA: Diagnosis not present

## 2018-09-23 DIAGNOSIS — R519 Headache, unspecified: Secondary | ICD-10-CM

## 2018-09-23 DIAGNOSIS — R0602 Shortness of breath: Secondary | ICD-10-CM | POA: Diagnosis not present

## 2018-09-23 NOTE — Telephone Encounter (Signed)
[facility] Visit  Patient consented to have virtual visit, but was limited by technology and thus phone was used.   Method of visit: Video was attempted, but technology challenges prevented patient from using video, so visit was conducted via telephone.  Encounter participants: Patient: [name] - located at home Provider: Ralene Ok - located at [facility]  Others (if applicable): none  Chief Complaint: cough, SOB  HPI:  She lives alone. Her aunt lives next door. She noticed more SOB when lying flat and starts coughing. She has been to the mailbox and back. Last time she went to the store was February. Her aunt brings her food. Her aunt seems healthy. She coughs only when lying flat. Her temp was 99.6 today oral. She feels SOB only when lying down. She has a heart murmur, but no known CHF to her knowledge. No runny nose. She has been using her allergy nose spray and her asthma inhaler. Doesn't get more SOB when she walked to the mailbox. Doesn't have pulse ox at home. Her nurse is coming tomorrow. Her nurse is with [facility], his name is Event organiser. He normally checks her pulse ox. She thinks it was ok the last time he was there was last weds. He fills her pill box. She also has a HA and ears are hurting. In the past she had cerumen impaction it sounds like. She thinks her ears are stopped back up again. Her HA feels like pain over her R eye. She doesn't normally have headaches. This one of her worse headaches. She has a hx of sinus troubles. She thinks she might have a sinus infection. She doesn't feel SOB right now. She has expired tylenol. She thinks that her aunt could get her some APAP.   ROS: per HPI  Pertinent PMHx: asthma, hx of liver transplant, seizures, MR, schizophrenia  Exam:  Respiratory: speaks in long sentences without obvious respiratory distress. Unable to use video.   Assessment/Plan:  SOB: wide differential, particularly as I am  unable to visualize her respirations.  She has had a cardiology work-up for cardiac flow murmur including a Myoview in 2018 which showed normal EF.  Shortness of breath when lying flat is concerning for CHF, but patient is not having dyspnea on exertion.  The mildly elevated temperature along with headache and sinus fullness is concerning for viral or bacterial etiology.  She has a nurse coming to her home tomorrow as normally scheduled and I asked the patient to call us with the nurse present to inform us of her vitals.  Additionally, she can use Tylenol for headache and ear pain.  She was recommended to go to the emergency room at [facility] if she has persistent concerns with her shortness of breath or any worsening.  If the shortness of breath does not resolve over the next week or so she may need evaluation for CHF including echo chest x-ray and blood work.  Time spent during visit with patient: 17 minutes

## 2018-09-24 ENCOUNTER — Telehealth: Payer: Self-pay | Admitting: *Deleted

## 2018-09-24 NOTE — Telephone Encounter (Signed)
Scott wanted to report that pt is still having some congestion and SOB.  Her lungs are clear but decreased sounds in bases.  BP: 140/68 RR: 18 Temp: 98.3  Christen Bame, CMA

## 2018-09-26 ENCOUNTER — Other Ambulatory Visit: Payer: Self-pay

## 2018-09-26 ENCOUNTER — Ambulatory Visit (INDEPENDENT_AMBULATORY_CARE_PROVIDER_SITE_OTHER): Payer: Medicaid Other | Admitting: Family Medicine

## 2018-09-26 ENCOUNTER — Encounter: Payer: Self-pay | Admitting: Family Medicine

## 2018-09-26 ENCOUNTER — Ambulatory Visit: Payer: Medicaid Other | Admitting: Family Medicine

## 2018-09-26 ENCOUNTER — Ambulatory Visit (HOSPITAL_COMMUNITY): Payer: Medicaid Other | Attending: Family Medicine

## 2018-09-26 VITALS — BP 100/80 | HR 121

## 2018-09-26 DIAGNOSIS — J302 Other seasonal allergic rhinitis: Secondary | ICD-10-CM | POA: Diagnosis present

## 2018-09-26 DIAGNOSIS — R251 Tremor, unspecified: Secondary | ICD-10-CM

## 2018-09-26 DIAGNOSIS — R000 Tachycardia, unspecified: Secondary | ICD-10-CM | POA: Diagnosis not present

## 2018-09-26 DIAGNOSIS — Z944 Liver transplant status: Secondary | ICD-10-CM

## 2018-09-26 MED ORDER — FLUTICASONE PROPIONATE 50 MCG/ACT NA SUSP: 2.0000 | Freq: Every day | NASAL | 6 refills | Status: DC

## 2018-09-26 NOTE — Patient Instructions (Addendum)
It was wonderful to see you today.  Thank you for choosing [facility].   Please call [phone] with any questions about today's appointment.  Please be sure to schedule follow up at the front  desk before you leave today.   Dorris Singh, MD  Family Medicine    It was great to see you today.   Start flonase- 1 spray in each nostril twice per day--- DO NOT SPRAY INTO THE NASAL BONE

## 2018-09-26 NOTE — Telephone Encounter (Signed)
Patient with SOB without fever. She has limited health literacy and wide differential for her SOB. I'd like her to have an in person visit given need for workup - maybe including BNP and CXR if possible/echo. I am concerned for OHS, CHF in her. I will route to Dr. Owens Shark who I think will be seeing her today. I have low suspicion for COVID in this patient as no fever and subacute onset. Jerene Pitch will schedule her for next available.

## 2018-09-26 NOTE — Progress Notes (Signed)
Patient Name: [name] Date of Birth: [birth year] Date of Visit: 09/26/18 PCP: Lovenia Kim, MD  Chief Complaint:   Subjective: [name] is a pleasant 43 y.o. with medical history significant for with history significant for primary biliary cirrhosis status post liver transplant, heavy menstrual bleeding, cognitive impairment, and asthma as well as seasonal allergies presenting today for right earache and congestion and postnasal drip.  The patient reports she has been doing well overall.  She is really been trying to stay inside.  Her home health nurses been coming each Wednesday to help her with medications.  The patient reports that she has had worsening bilateral ear fullness, some nasal congestion and a nocturnal cough for the last week.  This is worsened over the last 3 days.  She denies dyspnea on exertion, palpitations, chest pain, lower extremity edema, orthopnea or paroxysmal nocturnal dyspnea.  She denies chest pain, she denies a history of venous thromboembolic disease.  She reports she is taking her cyclosporine as prescribed.  The patient believes this is her allergies acting up but she has been spending a lot of time sitting on her porch by herself recently.  The patient has had documented tachycardia now for the last 1.5 years.  No EKG on file.  She denies symptoms of this.  Her heart rate improved to the high 90s on reevaluation.  She does endorse a history of heavy menstrual bleeding.  She gets heavy menses every month she reports she denies melena, hematochezia, hematemesis or easy bleeding or bruising.  She reports gets her blood work checked every month at [facility].  Her baseline hemoglobin appears to be around 10 6.  This was last obtained in March.   Scott- sounds like a sinus drainage, only at night and cough at AM  Sinus pressure  ROS: Negative as above ROS  I have reviewed the patient's medical, surgical, family, and social history as appropriate.   Vitals:   09/26/18 1055  BP: 100/80  Pulse: (!) 121  SpO2: 98%   There were no vitals filed for this visit. HEENT: Sclera anicteric. Dentition is poor. Appears well hydrated.  Bilateral TMs unable to be fully visualize due to impacted cerumen which is improving with use of Debrox she reports Neck: Supple Cardiac: Regular rate and rhythm. Normal S1/S2. No murmurs, rubs, or gallops appreciated. Lungs: Clear bilaterally to ascultation.  Abdomen: Normoactive bowel sounds. No tenderness to deep or light palpation. No rebound or guarding.  Extremities: Warm, well perfused without edema.  Skin: Warm, dry Psych: Pleasant and appropriate  No lower extremity edema no jugular venous distention.  Lungs are clear as documented above Neuro the patient has some upper extremity tremors on examination.  Diagnoses and all orders for this visit:  Seasonal allergies most likely the cause of her congestion and postnasal drip causing her difficulties at times at night and during the day.  Her weight is completely stable.  She is 210 pounds today.  She has no signs of fluid overload or finding suggestive of heart failure.  Her tachycardia improved and this is been ongoing for a year and a half.  I have a low suspicion for pulmonary embolism causing her elevated heart rate as documented below I do not suspect this is the cause of her symptoms at night of cough.  She has had no contacts with coronavirus 19 and no specific symptoms suggestive of this.  She does report that her mask causes her some difficulty at times because  she is been breathing through her mouth a lot. -     fluticasone (FLONASE) 50 MCG/ACT nasal spray; Place 2 sprays into both nostrils daily.  Tachycardia uncertain etiology.  This is ongoing for the last 1.5 years.  Will evaluate with labs as below.  EKG only shows normal sinus rhythm as her heart rate is improved after into the office -     CBC -     TSH -     Basic Metabolic Panel -     Hepatic Function  Panel -     EKG 12-Lead  Tremor possibly related to Depakote, TSH as above.   Dorris Singh, MD  Family Medicine Teaching Service

## 2018-09-27 ENCOUNTER — Encounter: Payer: Self-pay | Admitting: Family Medicine

## 2018-09-27 LAB — HEPATIC FUNCTION PANEL
ALT: 9 IU/L (ref 0–32)
AST: 15 IU/L (ref 0–40)
Albumin: 3.8 g/dL (ref 3.8–4.8)
Alkaline Phosphatase: 61 IU/L (ref 39–117)
Bilirubin Total: 0.4 mg/dL (ref 0.0–1.2)
Bilirubin, Direct: 0.16 mg/dL (ref 0.00–0.40)
Total Protein: 7 g/dL (ref 6.0–8.5)

## 2018-09-27 LAB — BASIC METABOLIC PANEL
BUN/Creatinine Ratio: 24 — ABNORMAL HIGH (ref 9–23)
BUN: 23 mg/dL (ref 6–24)
CO2: 23 mmol/L (ref 20–29)
Calcium: 9.1 mg/dL (ref 8.7–10.2)
Chloride: 107 mmol/L — ABNORMAL HIGH (ref 96–106)
Creatinine, Ser: 0.96 mg/dL (ref 0.57–1.00)
GFR calc Af Amer: 83 mL/min/{1.73_m2} (ref >59)
GFR calc non Af Amer: 72 mL/min/{1.73_m2} (ref >59)
Glucose: 96 mg/dL (ref 65–99)
Potassium: 4.3 mmol/L (ref 3.5–5.2)
Sodium: 142 mmol/L (ref 134–144)

## 2018-09-27 LAB — CBC
Hematocrit: 31.9 % — ABNORMAL LOW (ref 34.0–46.6)
Hemoglobin: 10.3 g/dL — ABNORMAL LOW (ref 11.1–15.9)
MCH: 27.1 pg (ref 26.6–33.0)
MCHC: 32.3 g/dL (ref 31.5–35.7)
MCV: 84 fL (ref 79–97)
Platelets: 167 10*3/uL (ref 150–450)
RBC: 3.8 x10E6/uL (ref 3.77–5.28)
RDW: 14.9 % (ref 11.7–15.4)
WBC: 4 10*3/uL (ref 3.4–10.8)

## 2018-09-27 LAB — TSH: TSH: 3.37 u[IU]/mL (ref 0.450–4.500)

## 2018-09-27 NOTE — Progress Notes (Signed)
Letter sent, labs stable. She has a normocytic anemia, likely multifactorial. Recommended further evaluation of AUB if continues to be problematic for patient.  Dorris Singh, MD  Family Medicine Teaching Service

## 2018-09-29 ENCOUNTER — Ambulatory Visit: Payer: Medicaid Other | Admitting: Family Medicine

## 2018-10-01 ENCOUNTER — Other Ambulatory Visit: Payer: Self-pay

## 2018-10-01 ENCOUNTER — Ambulatory Visit (INDEPENDENT_AMBULATORY_CARE_PROVIDER_SITE_OTHER): Payer: Medicaid Other | Admitting: Neurology

## 2018-10-01 ENCOUNTER — Encounter: Payer: Self-pay | Admitting: Neurology

## 2018-10-01 ENCOUNTER — Telehealth: Payer: Self-pay | Admitting: Neurology

## 2018-10-01 DIAGNOSIS — G40909 Epilepsy, unspecified, not intractable, without status epilepticus: Secondary | ICD-10-CM | POA: Diagnosis not present

## 2018-10-01 NOTE — Addendum Note (Signed)
Addended by: Inis Sizer D on: 10/01/2018 04:27 PM   Modules accepted: Orders

## 2018-10-01 NOTE — Telephone Encounter (Signed)
Spoke with [name] at [facility] and she stated that they would draw Depakote level on her as long as we fax the order over to them. She also stated that there have been no changes to her medication list. Order has been faxed and confirmation fax has been received.   [facility] Tel: [phone] Fax: [phone]

## 2018-10-01 NOTE — Telephone Encounter (Signed)
Please call [facility] care and see if they are able to obtain a Depakote blood level.  According to the patient they come out to her home to manage her medications, and draw blood work.  I have placed the order, please discuss with them and see if we need to fax the order over.  Also please review medication list with [facility], she was not able to review her medication list with me today.

## 2018-10-01 NOTE — Progress Notes (Signed)
Virtual Visit via Video Note  I connected with [name] on 10/01/18 at  1:45 PM EDT by a video enabled telemedicine application and verified that I am speaking with the correct person using two identifiers.  Location: Patient: At her place of residence Provider: At her place of residence   I discussed the limitations of evaluation and management by telemedicine and the availability of in person appointments. The patient expressed understanding and agreed to proceed.  History of Present Illness: HISTORY 03/23/14 [name]): [name], 43 year old black female returns today for followup of epilepsy. her uncle is [name], He is the power of attorney for [name]. She has a history of mild mental retardation, and long-standing history of seizures. She was well-controlled on Depakote and Tegretol extended release. But she has developed liver disease, was diagnosed with primary biliary cirrhosis,Antieptical medication was changed since February 2013, she is now taking Vimpat 100 mg 2 tabs twice a day, Keppra 500 mg 3 tabs daily. She has no significant seizure activities while taking Vimpat, and Keppra.She lives alone, uncles lives next door, she attends adult daycare regularly.She underwent liver transplantation in February 2015, doing very well, on polypharmacy treatment, this including CellCept 1000 mg twice a day, Prograft 4 mg twice a day,For her seizure, she is now taking deapkote ER 250mg  5 tabs qhs, she is having seizure about once amonth, it usually happens at nighttime, she was noted to have nighttime extremity jumping movement, tongue biting when she woke up, she also complains of mild gait difficullty,   UPDATE Oct 27th 2015:I have talked with [name] liver transplant cordinator, reported, 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, at [facility], Keppra, Vimpat cause worsening behavior issue, she had a hallucinations, paranoid to the point of almost institutionalized, her mood disorder  eventually was under control with Depakote ER 250 mg 5 tablets each day, [name] continues to complains that she had a 2 episode of waking up with blood in her mouth, has bitten her tongue, one in July, the second one was September 2015, overall she is tolerating the medication well, Depakote level was 63 in February 23 2014, EEG was normal.Repeat laboratory evaluation in March 09 2014, showed decreased white count 1.6, she is receiving weekly laboratory evaluation by [name] transplant team, I have called coordinator again, is aware of her leukopenia, she is on lower dose of immunosuppressive treatment, now taking cyclosporine 200 mg twice a day, CellCept 250 mg twice a day,She has personal aid every evening  UPDATE December 08 2015:yy She is brought in by [name] today, I reviewed and summarized her to visit in January 2017, "Patient's current immunosuppression regimen is Cyclosporine 100/125mg  and Mycophenolate 500MG  BID". She has no clinical features concerning for complications of immunosuppressive drug therapy,  She is now taking Depakote DR 250mg  4 tabs qhs, she reported one episode of left hand shaking in July 2017.   Laboratory evaluation in January 2017, Depakote level 78, CBC showed mild anemia, hemoglobin 9.6, normal CMP, Update 01/16/2018CM. [name], 43 year old female returns for follow-up for seizure disorder. She has a history of liver transplant in 2015. She is currently on Depakote extended release 500 mg 2 at bedtime. She reports no seizure activity in several years. She continues to live alone, uncle lives next door. She has no new neurologic complaints. Labs from [place] reviewed from 12/21/2015. She has not had a recent VPA level. She returns for reevaluation. UPDATE 07/09/2018CM [name], 43 year old female returns  for follow-up for her history of seizure disorder. She has not had any seizure activity in several years .  She had a history of liver transplant at  [facility] in 2015. She remains on Depakote 500 mg 2 tablets at bedtime. She continues to live alone family member last next door. She had recent labs at [facility] however results are not in the computer. She had recent CBC on 10/30/16 which is stable. She is currently being evaluated for chest pain by Dr. Tamala Julian , she will have a myocardial perfusion test on Tuesday . She returns for reevaluation UPDATE 7/23/2019CM [name], 43 year old female returns for follow-up with history of seizure disorder.  She has not had any seizures in several years, at least 3.  She has a history of liver transplant at [facility] in 2015.  She is currently on Depakote 500 mg 2 tablets at bedtime.  Reviewed recent CBC and CMP from 11/19/2017.  Patient continues to live alone however a relative lives in the right next door.  She has had no interval medical issues she claims.  She returns for reevaluation Update 10/01/2018 SS: [name] is a 43 year old female with history of seizure disorder.  She is currently taking Depakote ER 500 mg 2 tablets at bedtime.  She currently lives alone, her uncle lives next door.  She reports that she has been doing well, she denies any recent seizures.  In 2015 she did have a liver transplant.  She does have a nurse who comes out to help with her medications, fills her medication box.  She reports during the day she likes working on her laptop, finding recipes for cooking.  She reports regular follow-up with her primary care doctor.  She reports she has been walking well, she denies any falls.  She says her health has been doing well, her uncle voiced the same.  She reports she does not work and she does not drive a car.  She is not sure of all of her medications, [place] manages these.   Lab work done at her primary care office 09/26/2018 hepatic function panel within normal limits, hemoglobin was low at 10.3 (chronic anemia), TSH was normal  She did see her primary care doctor 09/26/2018 for congestion, nasal drip,  they noted a tremor in her upper extremities, could be related to Depakote her TSH. She denies any tremor, reports her psychiatrist started her on medication and has been beneficial  Observations/Objective: Was unable to do a video visit, attempted doxy.me and FaceTime.   Alert, answers questions appropriately, fair knowledge of health condition, history provided equally by uncle and patient  Assessment and Plan: 1.  Seizure disorder  She has not had any recurrent seizures.  I will check a Depakote level today. She did have lab work done at her primary care office on Sep 26, 2018.  Lab work was unremarkable with the exception of a chronically low hemoglobin.  Her family requested we check to see if [facility] can draw her lab work, fax the results to [place].  We need to check a Depakote level.  Per primary care they noted a tremor to her upper extremities.  She denies this, we were unable to do a video visit.  We will check a level to ensure no toxicity.  Otherwise she is not exhibiting any other signs.  I was also unable to review medication list, [facility] manages her medicines.  Her uncle assisted with today's visit.  Follow Up Instructions: 6 months for revisit  I discussed the assessment and treatment plan with the patient. The patient was provided an opportunity to ask questions and all were answered. The patient agreed with the plan and demonstrated an understanding of the instructions.   The patient was advised to call back or seek an in-person evaluation if the symptoms worsen or if the condition fails to improve as anticipated.  I provided 25 minutes of non-face-to-face time during this encounter.  Evangeline Dakin, DNP  [facility] [address] [place], [place] [address] [phone]

## 2018-10-07 ENCOUNTER — Other Ambulatory Visit: Payer: Self-pay | Admitting: Family Medicine

## 2018-10-07 DIAGNOSIS — Z1231 Encounter for screening mammogram for malignant neoplasm of breast: Secondary | ICD-10-CM

## 2018-10-13 NOTE — Progress Notes (Signed)
I have reviewed and agreed above plan. 

## 2018-10-27 ENCOUNTER — Telehealth: Payer: Self-pay | Admitting: *Deleted

## 2018-10-27 NOTE — Telephone Encounter (Signed)
So sorry, I overlooked that.  I called pt and scheduled an appt. Christen Bame, CMA

## 2018-10-27 NOTE — Telephone Encounter (Signed)
Pt would like a letter with results from labs on 4/1. To ordering MD.  Christen Bame, CMA

## 2018-10-27 NOTE — Telephone Encounter (Signed)
Looks like Dr. Owens Shark ordered this, I'll forward to her. I think Janett Billow meant 4/1.

## 2018-11-04 ENCOUNTER — Ambulatory Visit: Payer: Medicaid Other | Admitting: Family Medicine

## 2018-11-04 ENCOUNTER — Encounter: Payer: Self-pay | Admitting: Family Medicine

## 2018-11-04 ENCOUNTER — Other Ambulatory Visit: Payer: Self-pay

## 2018-11-04 VITALS — BP 108/72 | HR 95

## 2018-11-04 DIAGNOSIS — J302 Other seasonal allergic rhinitis: Secondary | ICD-10-CM | POA: Diagnosis not present

## 2018-11-04 DIAGNOSIS — N920 Excessive and frequent menstruation with regular cycle: Secondary | ICD-10-CM

## 2018-11-04 NOTE — Progress Notes (Signed)
Subjective:   Patient ID: [name]    DOB: 18-Nov-1975, 43 y.o. female   MRN: [id number]  CC: f/u allergies, heavy menses  HPI: [name] is a 43 y.o. female who presents to clinic today for the following issue.  Ear fullness Following up for bilateral ear fullness and feels like it is "stopped up" for the past month.   No fever, chills but has had a cough last month which has resolved.  No sore throat.  She has a history of seasonal allergies and saw Dr. Owens Shark last month.  She was started on Flonase which she has been taking and has noticed marked improvement in her congestion.  She needs a refill of this medication.  She has also been taking Zyrtec 10 mg daily for her allergies. Uses debrox drops to clear out ear wax. No sick contacts or contact with individuals with COVID-19.   Tachycardia  Has had some lightheadedness recently over the last month and was found to be tachycardic at last visit. EKG was normal sinus and labs ordered.  TSH and liver function within normal, BMP also unremarkable, however Hgb 10.3.  She does chronically have hgb of 9-10.  She denies palpitations and leg swelling. She has not had any falls or syncopal episodes.  She lives by herself with her uncle next door and is able to check on her frequently.  Heavy menstrual bleeding Menstrual periods are regular and typically last about 4 days.  She is having to change about 5 pads a day.  Pads are almost soaked through when she is changing them.  No vaginal discharge.  She is not sexually active and has not sex before.  She denies fever, chills, abdominal pain or cramping.  She denies rectal bleeding or pain with defecation. She denies constipation or having to strain.  Has regular bowel movements about 1-2 times a day.    ROS: See HPI for pertinent ROS.  Social: pt is a never smoker.  Medications reviewed. Objective:   BP 108/72   Pulse 95   LMP 10/03/2018 (Exact Date)   SpO2 99%  Vitals and nursing note  reviewed.  General: 43 yo female, NAD  HEENT: NCAT, EOMI, PERRL, MMM, cerumen impacted bilaterally  Neck: supple, no JVD  CV: RRR no MRG  Lungs: CTAB, normal effort  Abdomen: soft, NTND, +bs  Pelvic exam: VULVA: normal appearing vulva with no masses, tenderness or lesions, VAGINA: normal appearing vagina with normal color and discharge, no lesions, CERVIX: normal appearing cervix without discharge or lesions, RECTAL: normal rectal, no masses or evidence of hemorrhoids.  Skin: warm, dry, no rash Extremities: warm and well perfused, no edema present Neuro: alert, oriented, +upper extremity tremor of bilateral hands noted  Assessment & Plan:   Tachycardia Appears to have resolved, ?possibly due to her anxiety.  TSH normal at last visit and EKG showing sinus rhythm.  If recurs, could consider following up with an echocardiogram for further evaluation.   Seasonal allergies Suspect ear fullness and congestion related to seasonal allergies, has found some relief with Flonase prescribed at last visit.  No additional symptoms or contact with COVID+ individuals.  Continue Flonase and Zyrtec daily, reviewed proper way to administer spray  Menorrhagia Pelvic exam unremarkable.  No history of rectal bleeding per patient and no evidence of this on exam.  Hgb at baseline for her, has chronically been low in 9-10 range.   -have placed order for complete Transvaginal US to evaluate  Orders Placed This Encounter  Procedures  . US Pelvic Complete With Transvaginal    Epic order Wt  213/ no needs Ins- mcd No to all COVID Q's  Pt aware to drink 32 oz of water 1 hour prior to appt and don not void Pda/ April @[facility]c  [phone]    Standing Status:   Future    Standing Expiration Date:   01/04/2020    Order Specific Question:   Reason for Exam (SYMPTOM  OR DIAGNOSIS REQUIRED)    Answer:   heavy menstrual bleeding    Order Specific Question:   Preferred imaging location?    Answer:   [facility]   Lovenia Kim, MD [place] PGY-3 11/04/2018 12:30 PM

## 2018-11-04 NOTE — Assessment & Plan Note (Signed)
" >>  ASSESSMENT AND PLAN FOR SEASONAL ALLERGIES WRITTEN ON 11/04/2018 12:13 PM BY Brystal Kildow, MD  Suspect ear fullness and congestion related to seasonal allergies, has found some relief with Flonase  prescribed at last visit.  No additional symptoms or contact with COVID+ individuals.  Continue Flonase  and Zyrtec  daily, reviewed proper way to administer spray "

## 2018-11-04 NOTE — Assessment & Plan Note (Signed)
Pelvic exam unremarkable.  No history of rectal bleeding per patient and no evidence of this on exam.  Hgb at baseline for her, has chronically been low in 9-10 range.   -have placed order for complete Transvaginal US to evaluate

## 2018-11-04 NOTE — Assessment & Plan Note (Signed)
Suspect ear fullness and congestion related to seasonal allergies, has found some relief with Flonase prescribed at last visit.  No additional symptoms or contact with COVID+ individuals.  Continue Flonase and Zyrtec daily, reviewed proper way to administer spray

## 2018-11-05 ENCOUNTER — Other Ambulatory Visit: Payer: Self-pay | Admitting: Family Medicine

## 2018-11-11 ENCOUNTER — Telehealth: Payer: Self-pay | Admitting: Family Medicine

## 2018-11-11 NOTE — Telephone Encounter (Signed)
Pt called requesting to get a homehealth aid. Best phone # to contact is [phone].

## 2018-11-12 ENCOUNTER — Other Ambulatory Visit: Payer: Self-pay | Admitting: Allergy and Immunology

## 2018-11-14 ENCOUNTER — Other Ambulatory Visit: Payer: Self-pay | Admitting: Family Medicine

## 2018-11-14 ENCOUNTER — Ambulatory Visit
Admission: RE | Admit: 2018-11-14 | Discharge: 2018-11-14 | Disposition: A | Discharge status: Home / Self Care | Payer: Medicaid Other | Source: Ambulatory Visit | Attending: Family Medicine | Admitting: Family Medicine

## 2018-11-14 DIAGNOSIS — N920 Excessive and frequent menstruation with regular cycle: Secondary | ICD-10-CM

## 2018-11-21 ENCOUNTER — Other Ambulatory Visit: Payer: Self-pay | Admitting: Family Medicine

## 2018-11-21 DIAGNOSIS — R4189 Other symptoms and signs involving cognitive functions and awareness: Secondary | ICD-10-CM

## 2018-11-21 NOTE — Telephone Encounter (Signed)
Informed patient of Home Health referral.  Patient verbalized understanding.  Ozella Almond, [place]

## 2018-11-21 NOTE — Telephone Encounter (Signed)
I have placed a referral to home health for her, please inform patient.

## 2018-11-26 DIAGNOSIS — Z8669 Personal history of other diseases of the nervous system and sense organs: Secondary | ICD-10-CM

## 2018-11-26 HISTORY — DX: Personal history of other diseases of the nervous system and sense organs: Z86.69

## 2018-11-27 ENCOUNTER — Other Ambulatory Visit: Payer: Self-pay

## 2018-11-27 ENCOUNTER — Ambulatory Visit
Admission: RE | Admit: 2018-11-27 | Discharge: 2018-11-27 | Disposition: A | Discharge status: Home / Self Care | Payer: Medicaid Other | Source: Ambulatory Visit | Attending: Family Medicine | Admitting: Family Medicine

## 2018-11-27 DIAGNOSIS — Z1231 Encounter for screening mammogram for malignant neoplasm of breast: Secondary | ICD-10-CM

## 2018-12-08 ENCOUNTER — Ambulatory Visit: Payer: Medicaid Other | Admitting: Family Medicine

## 2018-12-14 ENCOUNTER — Other Ambulatory Visit: Payer: Self-pay

## 2018-12-14 ENCOUNTER — Emergency Department (HOSPITAL_COMMUNITY)
Admission: EM | Admit: 2018-12-14 | Discharge: 2018-12-14 | Disposition: A | Discharge status: Home / Self Care | Payer: Medicaid Other | Attending: Emergency Medicine | Admitting: Emergency Medicine

## 2018-12-14 ENCOUNTER — Encounter (HOSPITAL_COMMUNITY): Payer: Self-pay | Admitting: Emergency Medicine

## 2018-12-14 ENCOUNTER — Emergency Department (HOSPITAL_COMMUNITY): Payer: Medicaid Other

## 2018-12-14 DIAGNOSIS — R4701 Aphasia: Secondary | ICD-10-CM | POA: Insufficient documentation

## 2018-12-14 DIAGNOSIS — Z8709 Personal history of other diseases of the respiratory system: Secondary | ICD-10-CM | POA: Insufficient documentation

## 2018-12-14 DIAGNOSIS — Z20828 Contact with and (suspected) exposure to other viral communicable diseases: Secondary | ICD-10-CM | POA: Insufficient documentation

## 2018-12-14 DIAGNOSIS — Z91040 Latex allergy status: Secondary | ICD-10-CM | POA: Insufficient documentation

## 2018-12-14 DIAGNOSIS — R29810 Facial weakness: Secondary | ICD-10-CM | POA: Diagnosis present

## 2018-12-14 DIAGNOSIS — E669 Obesity, unspecified: Secondary | ICD-10-CM | POA: Diagnosis not present

## 2018-12-14 DIAGNOSIS — Z944 Liver transplant status: Secondary | ICD-10-CM | POA: Insufficient documentation

## 2018-12-14 LAB — CBC WITH DIFFERENTIAL/PLATELET
Abs Immature Granulocytes: 0.02 10*3/uL (ref 0.00–0.07)
Basophils Absolute: 0 10*3/uL (ref 0.0–0.1)
Basophils Relative: 1 %
Eosinophils Absolute: 0.2 10*3/uL (ref 0.0–0.5)
Eosinophils Relative: 4 %
HCT: 29.5 % — ABNORMAL LOW (ref 36.0–46.0)
Hemoglobin: 9.5 g/dL — ABNORMAL LOW (ref 12.0–15.0)
Immature Granulocytes: 1 %
Lymphocytes Relative: 38 %
Lymphs Abs: 1.3 10*3/uL (ref 0.7–4.0)
MCH: 28.1 pg (ref 26.0–34.0)
MCHC: 32.2 g/dL (ref 30.0–36.0)
MCV: 87.3 fL (ref 80.0–100.0)
Monocytes Absolute: 0.6 10*3/uL (ref 0.1–1.0)
Monocytes Relative: 16 %
Neutro Abs: 1.4 10*3/uL — ABNORMAL LOW (ref 1.7–7.7)
Neutrophils Relative %: 40 %
Platelets: 152 10*3/uL (ref 150–400)
RBC: 3.38 MIL/uL — ABNORMAL LOW (ref 3.87–5.11)
RDW: 14.5 % (ref 11.5–15.5)
WBC: 3.5 10*3/uL — ABNORMAL LOW (ref 4.0–10.5)
nRBC: 0 % (ref 0.0–0.2)

## 2018-12-14 LAB — COMPREHENSIVE METABOLIC PANEL
ALT: 12 U/L (ref 0–44)
AST: 21 U/L (ref 15–41)
Albumin: 3.2 g/dL — ABNORMAL LOW (ref 3.5–5.0)
Alkaline Phosphatase: 46 U/L (ref 38–126)
Anion gap: 9 (ref 5–15)
BUN: 17 mg/dL (ref 6–20)
CO2: 21 mmol/L — ABNORMAL LOW (ref 22–32)
Calcium: 8.4 mg/dL — ABNORMAL LOW (ref 8.9–10.3)
Chloride: 110 mmol/L (ref 98–111)
Creatinine, Ser: 1.2 mg/dL — ABNORMAL HIGH (ref 0.44–1.00)
GFR calc Af Amer: >60 mL/min (ref >60)
GFR calc non Af Amer: 55 mL/min — ABNORMAL LOW (ref >60)
Glucose, Bld: 100 mg/dL — ABNORMAL HIGH (ref 70–99)
Potassium: 4.7 mmol/L (ref 3.5–5.1)
Sodium: 140 mmol/L (ref 135–145)
Total Bilirubin: 0.7 mg/dL (ref 0.3–1.2)
Total Protein: 6.6 g/dL (ref 6.5–8.1)

## 2018-12-14 LAB — I-STAT BETA HCG BLOOD, ED (MC, WL, AP ONLY): I-stat hCG, quantitative: <5 m[IU]/mL (ref <5)

## 2018-12-14 MED ORDER — PREDNISONE 10 MG PO TABS: ORAL_TABLET | ORAL | 0 refills | Status: DC

## 2018-12-14 MED ORDER — SODIUM CHLORIDE 0.9 % IV BOLUS
500.0000 mL | Freq: Once | INTRAVENOUS | Status: AC
  Administered 2018-12-14: 500 mL via INTRAVENOUS

## 2018-12-14 MED ORDER — BENZONATATE 100 MG PO CAPS: 100.0000 mg | ORAL_CAPSULE | Freq: Three times a day (TID) | ORAL | 0 refills | Status: DC | PRN

## 2018-12-14 MED ORDER — HYPROMELLOSE (GONIOSCOPIC) 2.5 % OP SOLN: 1.0000 [drp] | Freq: Four times a day (QID) | OPHTHALMIC | 12 refills | Status: DC | PRN

## 2018-12-14 NOTE — ED Notes (Signed)
Patients uncle Sonia Side called and updated, requested by patient.

## 2018-12-14 NOTE — ED Triage Notes (Signed)
Per [facility] pt coming from home reports slurred speech and facial droop onset Wednesday. Patient c/o left eye watering and soreness then family informed her of facial droop and speech changes.

## 2018-12-14 NOTE — ED Provider Notes (Signed)
Received patient at signout from [facility].  Refer to provider note for full history and physical examination.  Briefly, patient is a 43 year old female with history of liver transplant, seizures, asthma, mental retardation, possible CVA (per the patient though not documented in her medical history) presenting with 4-day history of progressively worsening left-sided facial droop with some report of aphasia.  Most likely Bell's palsy but obtaining MRI to rule out CVA.  If MRI shows no acute abnormalities, patient stable for discharge home with treatment for Bell's palsy.  Physical Exam  BP (!) 129/98   Pulse 95   Temp 98.8 F (37.1 C) (Oral)   Resp (!) 23   Ht 5\' 4"  (1.626 m)   Wt 94.8 kg   SpO2 100%   BMI 35.87 kg/m   Physical Exam Vitals signs and nursing note reviewed.  Constitutional:      General: She is not in acute distress.    Appearance: She is well-developed.  HENT:     Head: Normocephalic and atraumatic.  Eyes:     General:        Right eye: No discharge.        Left eye: No discharge.     Conjunctiva/sclera: Conjunctivae normal.  Neck:     Vascular: No JVD.     Trachea: No tracheal deviation.  Cardiovascular:     Rate and Rhythm: Normal rate and regular rhythm.  Pulmonary:     Effort: Pulmonary effort is normal.     Breath sounds: Normal breath sounds.  Chest:     Chest wall: No tenderness.  Abdominal:     General: There is no distension.  Skin:    General: Skin is warm and dry.     Findings: No erythema.  Neurological:     Mental Status: She is alert.     Cranial Nerves: Cranial nerve deficit present.     Comments: Left facial droop noted at rest.  Patient unable to raise her left eyebrow.  Mild conjunctival injection of the left eye with no chemosis, proptosis, or consensual photophobia.  No pain with EOMs.  Mild clear tearful drainage noted.  Psychiatric:        Behavior: Behavior normal.     ED Course/Procedures     Procedures  MDM  Mr Brain Wo  Contrast  Result Date: 12/14/2018 CLINICAL DATA:  Left facial droop and aphasia. History of liver transplant, seizures, morbid obesity, and possibly stroke. EXAM: MRI HEAD WITHOUT CONTRAST TECHNIQUE: Multiplanar, multiecho pulse sequences of the brain and surrounding structures were obtained without intravenous contrast. COMPARISON:  Head CT 09/04/2017 FINDINGS: Brain: There is no evidence of acute infarct, intracranial hemorrhage, mass, midline shift, or extra-axial fluid collection. The ventricles and sulci are within normal limits. Vascular: Major intracranial vascular flow voids are preserved. Skull and upper cervical spine: Unremarkable bone marrow signal. Sinuses/Orbits: Bilateral lens replacements. Paranasal sinuses and mastoid air cells are clear. Other: None. IMPRESSION: Negative brain MRI. Electronically Signed   By: Logan Bores M.D.   On: 12/14/2018 16:29   5:06 PM Patient resting comfortably in no apparent distress.  Most likely has Bell's palsy.  She does tell me that in the last few days she has had a mild cough that has generally been well controlled with her albuterol inhaler.  Denies fevers or any known sick contacts.  She shows no evidence of respiratory distress.  Doubt ACS/MI or PE.  In the absence of fever or increased work of breathing and with  clear breath sounds on examination, doubt pneumonia.  Will obtain COVID send out test and discussed quarantining at home.  Potential viral respiratory illness could have been the impetus for her Bell's palsy.  We also discussed taping her eyelids at night and I showed her the appropriate technique.  We will also discharge with artificial tears.  Given patient is on antirejection medications for her liver transplant will consult with pharmacy for most appropriate medication regimen.  5:24 PM Spoke with Maudie Mercury with Pharmacy who has reviewed the patient's medical history.  She reports that there would be little to no utility in antivirals but  prednisone may be reasonable.  She recommends a course of prednisone starting at 60mg  x 5 days, followed by a 5-day taper.  Amoura A Puzzo was evaluated in Emergency Department on 12/14/2018 for the symptoms described in the history of present illness. She was evaluated in the context of the global COVID-19 pandemic, which necessitated consideration that the patient might be at risk for infection with the SARS-CoV-2 virus that causes COVID-19. Institutional protocols and algorithms that pertain to the evaluation of patients at risk for COVID-19 are in a state of rapid change based on information released by regulatory bodies including the CDC and federal and state organizations. These policies and algorithms were followed during the patient's care in the ED.        Debroah Baller 12/14/18 1745    Jola Schmidt, MD 12/14/18 2315

## 2018-12-14 NOTE — ED Notes (Signed)
Patient transported to MRI 

## 2018-12-14 NOTE — Discharge Instructions (Addendum)
Start taking prednisone as prescribed.  You will take 60 tablets for 5 days and then taper down by 10 mg every day for the next 5 days.  Apply artificial tears to the left eye as needed for dry eye.  Tape your eye down at night like I showed you how to do before bed so that your eyes do not open while you are asleep.  Your COVID test will result in about 48 hours.  Please quarantine at home until your your test results.  If your test is positive, please quarantine at home for at least 10 days since your cough started and 3 days fever free.  You can take Tessalon as needed for cough.  Call your primary care provider for reevaluation of your symptoms.  Return to the emergency department immediately if any concerning signs or symptoms develop such as weakness to one side of the body, high fevers, shortness of breath, chest pains, or loss of consciousness.

## 2018-12-14 NOTE — ED Notes (Signed)
Patients uncle Sonia Side updated and is coming to pick up patient for discharge.

## 2018-12-14 NOTE — ED Provider Notes (Signed)
[facility] EMERGENCY DEPARTMENT Provider Note   CSN: [id number] Arrival date & time: 12/14/18  1335     History   Chief Complaint Chief Complaint  Patient presents with  . Aphasia  . Facial Droop    HPI [name] is a 43 y.o. female with history of liver transplant, seizures, asthma, morbid obesity, MR, questionable CVA (patient unsure if she's had one, no documented) presents today for left-sided facial droop and aphasia.  Patient reports that 4 days ago she noticed a drooping heavy sensation to the left side of her face primarily around her left eye she reports difficulty closing the eye completely and noticed some tearing in that area.  Symptoms progressively worsened since that time and she now reports a heavy drooping sensation to the entire left side of her face from her forehead all the way down to her chin she reports difficulty closing the eye and keeping food and water in her mouth.  She reports that the facial droop is also caused her difficulty speaking and slurred speech.  She denies any other changes or concerns today.  No fever/chills, fall/injury, vision changes, headache, pain, tinnitus, neck pain/stiffness, numbness/weakness or tingling of the extremities, dizziness/balance changes or any additional concerns.     HPI  Past Medical History:  Diagnosis Date  . Abnormal vaginal bleeding   . Allergy   . Anemia   . Arthritis    right knee  . Asthma 09-23-12   Asthma somewhat causing increase wheezing and mild congestion at present  . Biliary cirrhosis ([place]) 04/26/11  . Constipation   . DUB (dysfunctional uterine bleeding)   . GERD (gastroesophageal reflux disease)   . Intellectual disability   . Liver transplant recipient [facility])   . PONV (postoperative nausea and vomiting)   . 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 Seizures ([place])    congenital epilepsy-hx. seizures  . Vitamin D deficiency     Patient Active Problem List   Diagnosis Date Noted  . Seasonal allergies 11/04/2018  . Liver transplant recipient [facility]) 09/26/2018  . Fall (on) (from) unspecified stairs and steps, subsequent encounter 09/06/2017  . Therapeutic drug monitoring 12/13/2016  . Sinusitis, acute 06/16/2015  . Lumbar back pain 04/06/2014  . Health care maintenance 03/06/2014  . Aftercare following organ transplant 07/28/2013  . 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 Thrombocytopenia ([place]) 03/09/2013  . Seizure disorder ([place]) 03/01/2013  . Schizophrenia, paranoid type ([place]) 06/25/2012    Class: Chronic  . 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 Menorrhagia 12/22/2010  . ANEMIA 03/03/2010  . GERD 07/21/2008  . 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 CARDIAC FLOW MURMUR 10/16/2007  . Convulsions/seizures ([place]) 08/23/2006  . Asthma with allergic rhinitis 07/30/2006  . Morbid obesity ([place]) 07/25/2006  . 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 Mental retardation 07/25/2006  . OSTEOARTHRITIS, LOWER LEG 07/25/2006    Past Surgical History:  Procedure Laterality Date  . adenonoidectomy    . APPENDECTOMY    . CHOLECYSTECTOMY    . COLONOSCOPY W/ BIOPSIES    . ESOPHAGOGASTRODUODENOSCOPY (EGD) WITH PROPOFOL N/A 10/01/2012   Procedure: ESOPHAGOGASTRODUODENOSCOPY (EGD) WITH PROPOFOL;  Surgeon: Arta Silence, MD;  Location: [facility] END[facility]COPY;  Service: Endoscopy;  Laterality: N/A;  . eustacean tubes    . EYE MUSCLE SURGERY    . LIVER TRANSPLANT       OB History   No obstetric history on file.      Home Medications    Prior to Admission medications  Medication Sig Start Date End Date Taking? Authorizing Provider  ACCU-CHEK SOFTCLIX LANCETS lancets Use to check blood sugar once daily in the morning prior to eating. 02/13/17   Lovenia Kim, MD  ammonium lactate (LAC-HYDRIN) 12 % cream Apply topically 2 (two) times daily.     [provider]  azelastine (ASTELIN) 0.1 % nasal spray Place 2 sprays into both nostrils 2 (two) times daily.  Use in each nostril as directed 07/08/18   Kozlow, Donnamarie Poag, MD  benztropine (COGENTIN) 0.5 MG tablet Take 0.5 mg by mouth at bedtime.    [facility]  Blood Glucose Monitoring Suppl (ACCU-CHEK GUIDE) w/Device KIT 1 each by Does not apply route daily. Use to check blood sugar once daily or as directed. 07/16/18   Zenia Resides, MD  budesonide-formoterol (SYMBICORT) 160-4.5 MCG/ACT inhaler Inhale 2 puffs into the lungs 2 (two) times daily. 07/08/18   Kozlow, Donnamarie Poag, MD  carbamide peroxide (DEBROX) 6.5 % OTIC solution Place 5 drops into the left ear 2 (two) times daily. 08/29/18   Leeanne Rio, MD  carboxymethylcellulose (REFRESH TEARS) 0.5 % SOLN Place 1 drop into both eyes 4 (four) times daily as needed (for dry eyes).    [provider]  cetirizine (ZYRTEC) 10 MG tablet TAKE 1 TABLET BY MOUTH EVERY DAY 11/12/18   Kozlow, Donnamarie Poag, MD  cycloSPORINE modified (NEORAL) 25 MG capsule Use in combination with 100 mg capsules for PM dose of 125 mg. 10/06/14   [provider]  cycloSPORINE modified 100 MG CAPS Take 100-125 mg by mouth 2 (two) times daily. Take 100 mg in the AM and 125 mg in the PM    [provider]  diclofenac sodium (VOLTAREN) 1 % GEL Apply 4 g topically 4 (four) times daily. 07/25/18   Guadalupe Dawn, MD  divalproex (DEPAKOTE ER) 500 MG 24 hr tablet Take 2 tablets (1,000 mg total) by mouth at bedtime. 12/17/17   Dennie Bible, NP  doxepin (SINEQUAN) 10 MG capsule Take 25 mg by mouth at bedtime.     [provider]  FIBER, GUAR GUM, PO Take by mouth.    [provider]  fluticasone (FLONASE) 50 MCG/ACT nasal spray Place 2 sprays into both nostrils daily. 09/26/18   Martyn Malay, MD  glucose blood (ACCU-CHEK GUIDE) test strip Use to check blood sugar once daily or as directed. 07/16/18   Zenia Resides, MD  hydrocortisone valerate cream (WESTCORT) 0.2 % APPLY 1 APPLICATION TOPICALLY TO AFFECTED AREAS ON HANDS AND FEET BID PRN FOR DERMATITIS  12/10/17   [provider]  magnesium oxide (MAG-OX) 400 MG tablet Take 400 mg by mouth 2 (two) times daily. 5pm & 7pm. 10/12/13   Patrecia Pour, NP  mometasone (ELOCON) 0.1 % cream Apply 1 application topically 2 (two) times daily. 07/08/18   Kozlow, Donnamarie Poag, MD  montelukast (SINGULAIR) 10 MG tablet TAKE 1 TABLET BY MOUTH AT BEDTIME 11/12/18   Kozlow, Donnamarie Poag, MD  Multiple Vitamin (MULTIVITAMIN) tablet Take 1 tablet by mouth daily. 9 am. 10/12/13   Patrecia Pour, NP  mycophenolate (CELLCEPT) 250 MG capsule Take 500 mg by mouth 2 (two) times daily. 9 am & 9 pm. 10/12/13   Patrecia Pour, NP  omeprazole (PRILOSEC) 40 MG capsule Take 40 mg by mouth daily. 9 am. 10/12/13   Patrecia Pour, NP  polyethylene glycol [facility]) packet Take 17 g by mouth daily as needed (for constipation). In water  or juice    [provider]  prazosin (MINIPRESS) 1 MG capsule Take 2 mg by mouth at bedtime.     [provider]  PROAIR HFA 108 (90 Base) MCG/ACT inhaler Inhale 1 puff into the lungs every 4 (four) hours as needed for shortness of breath. 07/08/18   Kozlow, Donnamarie Poag, MD  RA VITAMIN D-3 2000 units CAPS Take 1 capsule by mouth daily.  06/27/16   [provider]  risperiDONE (RISPERDAL) 3 MG tablet Take 3 mg by mouth at bedtime.    [provider]  triamcinolone cream (KENALOG) 0.1 % Apply 1 application topically once a week. 11/19/17   [provider]    Family History Family History  Problem Relation Age of Onset  . Diabetes Mother   . Heart disease Mother   . COPD Mother   . Breast cancer Cousin     Social History Social History   Tobacco Use  . Smoking status: Never Smoker  . Smokeless tobacco: Never Used  Substance Use Topics  . Alcohol use: No    Alcohol/week: 0.0 standard drinks  . Drug use: No     Allergies   Tramadol hcl, Aspirin, Ibuprofen, Penicillins, Pseudoephedrine, Sulfa antibiotics, and Latex   Review of Systems Review  of Systems Ten systems are reviewed and are negative for acute change except as noted in the HPI  Physical Exam Updated Vital Signs BP 126/63 (BP Location: Right Arm)   Pulse 98   Temp 98.8 F (37.1 C) (Oral)   Resp 18   Ht '5\' 4"'  (1.626 m)   Wt 94.8 kg   SpO2 97%   BMI 35.87 kg/m   Physical Exam Constitutional:      General: She is not in acute distress.    Appearance: Normal appearance. She is well-developed. She is obese. She is not ill-appearing or diaphoretic.  HENT:     Head: Normocephalic and atraumatic.     Jaw: No trismus.     Right Ear: External ear normal.     Left Ear: External ear normal.     Nose: Nose normal.  Eyes:     General: Vision grossly intact. Gaze aligned appropriately.     Extraocular Movements: Extraocular movements intact.     Conjunctiva/sclera: Conjunctivae normal.     Pupils: Pupils are equal, round, and reactive to light.  Neck:     Musculoskeletal: Normal range of motion and neck supple.     Trachea: Trachea and phonation normal. No tracheal deviation.  Cardiovascular:     Rate and Rhythm: Normal rate and regular rhythm.     Heart sounds: Normal heart sounds.  Pulmonary:     Effort: Pulmonary effort is normal. No accessory muscle usage or respiratory distress.  Chest:     Chest wall: No deformity, tenderness or crepitus.  Abdominal:     General: There is no distension.     Palpations: Abdomen is soft.     Tenderness: There is no abdominal tenderness. There is no guarding or rebound.  Musculoskeletal: Normal range of motion.  Feet:     Right foot:     Protective Sensation: 3 sites tested. 3 sites sensed.     Left foot:     Protective Sensation: 3 sites tested. 3 sites sensed.  Skin:    General: Skin is warm and dry.  Neurological:     Mental Status: She is alert.     GCS: GCS eye subscore is 4. GCS verbal subscore  is 5. GCS motor subscore is 6.     Comments: Mental Status: Alert, oriented, thought content appropriate, able to give  a coherent history.  Slurred speech patient reports is new.  Able to follow 2 step commands without difficulty. Cranial Nerves: II: Peripheral visual fields grossly normal, pupils equal, round, reactive to light III,IV, VI: ptosis not present, extra-ocular motions intact bilaterally V,VII: Left-sided facial droop, drooping smile, no eyebrow raise left side.  Light touch intact and equal bilaterally. VIII: hearing grossly normal to voice X: uvula elevates symmetrically XI: bilateral shoulder shrug symmetric and strong XII: midline tongue extension without fassiculations Motor: Normal tone. 5/5 strength in upper and lower extremities bilaterally including strong and equal grip strength and dorsiflexion/plantar flexion Sensory: Sensation intact to light touch in all extremities.Negative Romberg.  Cerebellar: normal finger-to-nose with bilateral upper extremities. No pronator drift.  CV: distal pulses palpable throughout  Psychiatric:        Behavior: Behavior normal.    ED Treatments / Results  Labs (all labs ordered are listed, but only abnormal results are displayed) Labs Reviewed - No data to display  EKG None  Radiology No results found.  Procedures Procedures (including critical care time)  Medications Ordered in ED Medications - No data to display   Initial Impression / Assessment and Plan / ED Course  I have reviewed the triage vital signs and the nursing notes.  Pertinent labs & imaging results that were available during my care of the patient were reviewed by me and considered in my medical decision making (see chart for details).    Patient with 4-day history of progressive left-sided facial paralysis and slurred speech.  Denies history of the same.  Neuro exam shows complete cranial nerve VII left-sided paralysis.  Sensation is intact.  No other neuro deficits on examination.  No recent infectious-like symptoms however she is a transplant patient.  Overall  well-appearing and in no acute distress she denies any pain.  Patient seen and evaluated with Dr. Sedonia Small, likely Bell's palsy however with patient's slurred speech will obtain MRI brain to evaluate for stroke patient is with symptoms for 4 days at this time and is out of stroke window. - CBC with hemoglobin 9.5 near baseline CMP creatinine 1.20 somewhat worse than baseline will give fluid bolus Beta-hCG negative  MRI Brain pending - Care handoff given to [facility] PA-C at shift change, MRI pending at this time. Oncoming team to follow-up on imaging results and disposition.   Note: Portions of this report may have been transcribed using voice recognition software. Every effort was made to ensure accuracy; however, inadvertent computerized transcription errors may still be present. Final Clinical Impressions(s) / ED Diagnoses   Final diagnoses:  None    ED Discharge Orders    None       Gari Crown 12/14/18 1532    Maudie Flakes, MD 12/15/18 [phone]

## 2018-12-15 LAB — NOVEL CORONAVIRUS, NAA (HOSP ORDER, SEND-OUT TO REF LAB; TAT 18-24 HRS): SARS-CoV-2, NAA: NOT DETECTED

## 2018-12-19 ENCOUNTER — Encounter: Payer: Self-pay | Admitting: Family Medicine

## 2018-12-19 ENCOUNTER — Other Ambulatory Visit: Payer: Self-pay

## 2018-12-19 ENCOUNTER — Ambulatory Visit: Payer: Medicaid Other | Admitting: Family Medicine

## 2018-12-19 VITALS — BP 115/80 | HR 101

## 2018-12-19 DIAGNOSIS — G510 Bell's palsy: Secondary | ICD-10-CM | POA: Diagnosis not present

## 2018-12-19 NOTE — Progress Notes (Signed)
   Subjective:   Patient ID: [name]    DOB: July 13, 1975, 43 y.o. female   MRN: [id number]  [name] is a 43 y.o. female with a history of asthma, GERD, primary biliary cirrhosis, s/p liver transplant, seizure disorder, osteoarthritis, thrombocytopenia, paranoid schizophrenia, chronic anemia, and mental retardation  here for hospital follow up.  Left sided Facial Droop: Patient presented to ED on 7/19 with a 4 day history of progressively worsening left-sided facial droop with some reported aphasia. Patient had MRI that was negative for CVA. She was diagnosed with Bell's Palsy and treated with Prednisone taper. She notes improvement in her cheek mobility. She continues to have some difficulty fully closing her left eye and continues to have watery eye. She is treating these symptoms with artifical tears and taping of her eyelids at night.   Review of Systems:  Per HPI.   [place], medications and smoking status reviewed.  Objective:   BP 115/80   Pulse (!) 101   SpO2 99%  Vitals and nursing note reviewed.  General: well nourished, well developed, in no acute distress with non-toxic appearance, sitting comfortably in exam chair HEENT: normocephalic, atraumatic, moist mucous membranes CV: regular rate and rhythm without murmurs, rubs, or gallops Lungs: clear to auscultation bilaterally with normal work of breathing Skin: warm, dry Extremities: warm and well perfused Neuro: Left facial droop on the left including forehead. Patient has baseline exotropia of the right eye. EOMI without pain. Mild increase lacrimation appreciated, however no conjunctival injection, chemosis, or proptosis. Facial sensation intact. Normal shoulder and neck movement with good strength. Tongue motion WNL and able to pass midline.   Assessment & Plan:   Left-sided Bell's palsy Left sided facial weakness with left eye lagophthalmos that appears to be slowly improving with steroid taper. Based on physical  exam, most consistent with bell's palsy. MRI negative for stroke which is reassuring. Patient has had a lingering cough so a predisposing viral illness could be the cause. Discussed expected length of recover being 4-6 months.  - Patient to finish steroid taper as prescribed - Artificial tears and nightly taping of eyelid until gains ability to close eye completely - RTC 1 month to ensure continued improvement. Will want to monitor for corneal injury given patients inability to fully close her eyelid. Once able to completely close eye lid, then can extend follow up visits  - Return precautions discussed at length including worsening facial droop, slurred speech, or unilateral weakness. Patient understood and agreed to plan.   Mina Marble, DO PGY-2, [facility] 12/22/2018 3:16 PM

## 2018-12-19 NOTE — Patient Instructions (Addendum)
Thank you for coming to see me today.  I think your facial droop is coming from your Bell's palsy.  Please continue to take the prednisone as prescribed and use the artificial tears each night and be sure to tape your eye closed.  This can take 4 to 6 months to recover with a possibility that there may be some permanent residual weakness on that side.  Please follow-up in 1 month with your PCP to ensure you are covering well.  Please return sooner if you begin to experience worsening facial droop, slurred speech, changes in vision, or develop any eye pain.  Thank you and feel free to call the clinic with any concerns.  Take care, Dr. Tarry Kos

## 2018-12-22 ENCOUNTER — Telehealth: Payer: Self-pay

## 2018-12-22 ENCOUNTER — Telehealth: Payer: Self-pay | Admitting: Family Medicine

## 2018-12-22 DIAGNOSIS — J4540 Moderate persistent asthma, uncomplicated: Secondary | ICD-10-CM

## 2018-12-22 DIAGNOSIS — G510 Bell's palsy: Secondary | ICD-10-CM | POA: Insufficient documentation

## 2018-12-22 HISTORY — DX: Bell's palsy: G51.0

## 2018-12-22 MED ORDER — BUDESONIDE 0.5 MG/2ML IN SUSP: 0.5000 mg | Freq: Two times a day (BID) | RESPIRATORY_TRACT | 5 refills | Status: DC

## 2018-12-22 MED ORDER — NEBULIZERS MISC: 1.0000 | 1 refills | Status: DC

## 2018-12-22 MED ORDER — PERFOROMIST 20 MCG/2ML IN NEBU: 20.0000 ug | INHALATION_SOLUTION | Freq: Two times a day (BID) | RESPIRATORY_TRACT | 5 refills | Status: DC

## 2018-12-22 MED ORDER — NEBULIZER/TUBING/MOUTHPIECE KIT: 2.0000 | PACK | 5 refills | Status: DC

## 2018-12-22 MED ORDER — ALBUTEROL SULFATE (2.5 MG/3ML) 0.083% IN NEBU: INHALATION_SOLUTION | RESPIRATORY_TRACT | 1 refills | Status: DC

## 2018-12-22 NOTE — Addendum Note (Signed)
Addended by: Lucrezia Starch I on: 12/22/2018 04:44 PM   Modules accepted: Orders

## 2018-12-22 NOTE — Telephone Encounter (Signed)
Pt called stated that she never got any call regarding referral to a home health yet. Please call pt, best phone # to contact is [phone]

## 2018-12-22 NOTE — Telephone Encounter (Addendum)
I spoke with the patient and she has informed me that she would rather do the nebulizer and the nebulizer medications. I have sent all necessary prescriptions to [facility] via e-prescription. I spoke with the pharmacy rep with [facility]. She has transferred me to [facility] so that we can get the orders transmitted and set up for delivery. The order may take a day or so to transfer. I will have to follow up tomorrow afternoon to see what the status is.

## 2018-12-22 NOTE — Telephone Encounter (Signed)
Patient called with concerns about bell's palsy and using her inhalers. She is unable to use them due to the drooping on the left side of her face. She does not currently have a nebulizer machine or nebulizer medications. Nancee Liter, CMA did educate her on how to use inhalers using the open mouth technique until she receives further instruction. Patient will need home delivery of neb and meds if you feel that is the proper route. Please advise and thank you.

## 2018-12-22 NOTE — Telephone Encounter (Signed)
Please inform patient that if she does not feel confident using the open-mouth technique with her MDI we can set her up to get budesonide 0.5 mg nebulized twice a day with Perforomist nebulized twice a day and albuterol nebulization every 4-6 hours if needed

## 2018-12-22 NOTE — Assessment & Plan Note (Signed)
Left sided facial weakness with left eye lagophthalmos that appears to be slowly improving with steroid taper. Based on physical exam, most consistent with bell's palsy. MRI negative for stroke which is reassuring. Patient has had a lingering cough so a predisposing viral illness could be the cause. Discussed expected length of recover being 4-6 months.  - Patient to finish steroid taper as prescribed - Artificial tears and nightly taping of eyelid until gains ability to close eye completely - RTC 1 month to ensure continued improvement. Will want to monitor for corneal injury given patients inability to fully close her eyelid. Once able to completely close eye lid, then can extend follow up visits  - Return precautions discussed at length including worsening facial droop, slurred speech, or unilateral weakness. Patient understood and agreed to plan.

## 2018-12-23 ENCOUNTER — Ambulatory Visit: Payer: Medicaid Other | Admitting: Nurse Practitioner

## 2018-12-23 ENCOUNTER — Ambulatory Visit: Payer: Medicaid Other | Admitting: Neurology

## 2018-12-23 MED ORDER — PERFOROMIST 20 MCG/2ML IN NEBU: 20.0000 ug | INHALATION_SOLUTION | Freq: Two times a day (BID) | RESPIRATORY_TRACT | 5 refills | Status: DC

## 2018-12-23 MED ORDER — BUDESONIDE 0.5 MG/2ML IN SUSP: 0.5000 mg | Freq: Two times a day (BID) | RESPIRATORY_TRACT | 5 refills | Status: DC

## 2018-12-23 MED ORDER — ALBUTEROL SULFATE (2.5 MG/3ML) 0.083% IN NEBU: INHALATION_SOLUTION | RESPIRATORY_TRACT | 1 refills | Status: DC

## 2018-12-23 NOTE — Telephone Encounter (Signed)
I am contacting our [employer] rep to see how he can assist with this matter.

## 2018-12-23 NOTE — Telephone Encounter (Signed)
Patient contacted office because she has not heard from [facility].   Talked to Leory Plowman and she states that she has tried 5 or 6 different agencies and patient's insurance will not pay for it under current diagnosis.  Informed patient of above information.  [name], [place]

## 2018-12-23 NOTE — Addendum Note (Signed)
Addended by: Lucrezia Starch I on: 12/23/2018 01:27 PM   Modules accepted: Orders

## 2018-12-23 NOTE — Telephone Encounter (Signed)
[name] with [facility] [phone]) [place] let me know that they would be able to ship the nebulizer machine and tubing/mask to patient. He told me that the medications would have to come from a retail pharmacy due to insurance.

## 2018-12-23 NOTE — Telephone Encounter (Signed)
I spoke with another of our pharm reps and he recommended a mail order pharmacy in [place] to get [name] the medications she needs. I have sent these prescription to [facility] in [place], [place] to get them shipped.

## 2018-12-23 NOTE — Telephone Encounter (Signed)
[facility] called and stated they are unable to fill medication for Budesonide, Perforomist and ipratropium since patient doesn't have MCR or other forms of MCR. Patients needs to have it sent local pharmacy.

## 2018-12-25 MED ORDER — PERFOROMIST 20 MCG/2ML IN NEBU: 20.0000 ug | INHALATION_SOLUTION | Freq: Two times a day (BID) | RESPIRATORY_TRACT | 5 refills | Status: DC

## 2018-12-25 NOTE — Addendum Note (Signed)
Addended by: Lucrezia Starch I on: 12/25/2018 06:52 PM   Modules accepted: Orders

## 2018-12-25 NOTE — Telephone Encounter (Signed)
Perforomist did require a PA, which has been approved and sent to the pharmacy,

## 2018-12-25 NOTE — Telephone Encounter (Signed)
I spoke with [name]'s pharmacy and verified receipt of the prescriptions. The pharm tech there is going to reach out to [name] again to set up the delivery of these medications. I have informed [name] of the status of the medications and will continue to update her accordingly.

## 2018-12-26 ENCOUNTER — Telehealth: Payer: Self-pay

## 2018-12-26 NOTE — Telephone Encounter (Signed)
error 

## 2018-12-26 NOTE — Telephone Encounter (Signed)
Patient called back to advise that she had still not heard from pharmacy or [facility], I gave her the phone numbers for both so that she could reach out to set up delivery.

## 2018-12-26 NOTE — Telephone Encounter (Signed)
[name] with [name] has received the order and is getting it sent out now.

## 2018-12-26 NOTE — Addendum Note (Signed)
Addended by: Lucrezia Starch I on: 12/26/2018 04:14 PM   Modules accepted: Orders

## 2018-12-29 NOTE — Telephone Encounter (Signed)
Patient called would like to know  How to use nebulizer medications. Advised to mix budesonide and performist together and do them twice a day. The albuterol is only as needed patient verbalized understanding and will call with any questions

## 2018-12-29 NOTE — Telephone Encounter (Signed)
Follow up call placed to patient to make sure she received all needed prescriptions and devices. No answer and unable to leave a message.

## 2018-12-30 NOTE — Telephone Encounter (Signed)
Patient has received her nebulizer and all needed medications. I let her know to call back with any questions or concerns.

## 2019-01-06 ENCOUNTER — Ambulatory Visit (INDEPENDENT_AMBULATORY_CARE_PROVIDER_SITE_OTHER): Payer: Medicaid Other | Admitting: Allergy and Immunology

## 2019-01-06 ENCOUNTER — Encounter: Payer: Self-pay | Admitting: Allergy and Immunology

## 2019-01-06 ENCOUNTER — Other Ambulatory Visit: Payer: Self-pay

## 2019-01-06 VITALS — BP 106/76 | HR 99 | Temp 97.9°F | Resp 16 | Ht 64.0 in

## 2019-01-06 DIAGNOSIS — L308 Other specified dermatitis: Secondary | ICD-10-CM | POA: Diagnosis not present

## 2019-01-06 DIAGNOSIS — J3089 Other allergic rhinitis: Secondary | ICD-10-CM | POA: Diagnosis not present

## 2019-01-06 DIAGNOSIS — L989 Disorder of the skin and subcutaneous tissue, unspecified: Secondary | ICD-10-CM

## 2019-01-06 DIAGNOSIS — K219 Gastro-esophageal reflux disease without esophagitis: Secondary | ICD-10-CM

## 2019-01-06 DIAGNOSIS — J4540 Moderate persistent asthma, uncomplicated: Secondary | ICD-10-CM | POA: Diagnosis not present

## 2019-01-06 MED ORDER — CETIRIZINE HCL 10 MG PO TABS: 10.0000 mg | ORAL_TABLET | Freq: Every day | ORAL | 5 refills | Status: DC

## 2019-01-06 MED ORDER — AZELASTINE HCL 0.1 % NA SOLN: 2.0000 | Freq: Two times a day (BID) | NASAL | 5 refills | Status: DC

## 2019-01-06 MED ORDER — MONTELUKAST SODIUM 10 MG PO TABS: 10.0000 mg | ORAL_TABLET | Freq: Every day | ORAL | 5 refills | Status: DC

## 2019-01-06 MED ORDER — MOMETASONE FUROATE 0.1 % EX CREA: 1.0000 "application " | TOPICAL_CREAM | Freq: Two times a day (BID) | CUTANEOUS | 5 refills | Status: DC

## 2019-01-06 NOTE — Patient Instructions (Addendum)
  1.  Continue Symbicort 160 2 inhalations twice a day  2. Continue montelukast 10 mg one tablet once a day  3. Continue nasal Azelastine and fluticasone 1 spray each nostril twice a day  4. Continue cetirizine 10 mg one tablet once a day  5. Continue ProAir HFA 2 puffs every 4-6 hours if needed  6. Continue Mometasone 0.1% ointment 1-2 times a day if needed   7. Treat reflux:   A. Use decaf coffee in the smoothie  B. Continue omeprazole 40 mg 2 times per day  C. Start famotidine 40 mg in evening  8. Return to clinic in 3 months or earlier if problem  9. Obtain fall flu vaccine (and COVID vaccine)

## 2019-01-06 NOTE — Progress Notes (Signed)
[place]   Follow-up Note  Referring Provider: Lovenia Kim, MD Primary Provider: [name], DO Date of Office Visit: 01/06/2019  Subjective:   [name] (DOB: 01/10/1976) is a 43 y.o. female who returns to the [facility] [place] on 01/06/2019 in re-evaluation of the following:  HPI: Muriah returns to this clinic in evaluation of asthma and allergic rhinoconjunctivitis and a history of inflammatory dermatosis.  I last saw her in this clinic on 08 July 2018.  She informs me that she had to go the emergency room on 14 December 2018 for an episode of coughing.  Currently she was given prednisone and cough suppressants.  Apparently she had no fever and she was COVID negative.  But in review of medical record in epic it looks like she went to the emergency room for Bell's palsy.  Otherwise, her requirement for short acting bronchodilator is about twice a week.  She does not really exercise to any degree.  She has not been having any problems with her nose.  Her reflux has been intermittently active a few times per week.  She does have heartburn and regurgitation up into her throat even while using omeprazole twice a day.  She does have the sensation of mucus is in her throat.  She drinks a chocolate - coffee smoothie every day.  Her skin condition involving her hands and occasionally her feet is going quite well while using topical mometasone 1 or 2 times per week.  Allergies as of 01/06/2019      Reactions   Tramadol Hcl Itching, Nausea And Vomiting   Causes SEIZURES   Aspirin Nausea And Vomiting   Due to Stomach ulcer   Ibuprofen Other (See Comments)   Due to stomach ulcer   Penicillins Itching, Swelling, Rash   Breakout  Has patient had a PCN reaction causing immediate rash, facial/tongue/throat swelling, SOB or lightheadedness with hypotension: Yes Has patient had a PCN reaction causing severe rash involving mucus  membranes or skin necrosis: Yes Has patient had a PCN reaction that required hospitalization: Yes Has patient had a PCN reaction occurring within the last 10 years: No If all of the above answers are "NO", then may proceed with Cephalosporin use.     Pseudoephedrine Itching, Swelling   Tongue swelling   Sulfa Antibiotics Nausea And Vomiting   Latex Itching, Rash      Medication List    Accu-Chek Guide w/Device Kit 1 each by Does not apply route daily. Use to check blood sugar once daily or as directed.   Accu-Chek Softclix Lancets lancets Use to check blood sugar once daily in the morning prior to eating.   azelastine 0.1 % nasal spray Commonly known as: Astelin Place 2 sprays into both nostrils 2 (two) times daily. Use in each nostril as directed   benzonatate 100 MG capsule Commonly known as: TESSALON Take 1 capsule (100 mg total) by mouth 3 (three) times daily as needed for cough.   benztropine 0.5 MG tablet Commonly known as: COGENTIN Take 0.5 mg by mouth at bedtime.   budesonide 0.5 MG/2ML nebulizer solution Commonly known as: PULMICORT Take 2 mLs (0.5 mg total) by nebulization 2 (two) times a day.   budesonide-formoterol 160-4.5 MCG/ACT inhaler Commonly known as: SYMBICORT Inhale 2 puffs into the lungs 2 (two) times daily.   carbamide peroxide 6.5 % OTIC solution Commonly known as: DEBROX Place 5 drops into the left ear  2 (two) times daily.   cetirizine 10 MG tablet Commonly known as: ZYRTEC TAKE 1 TABLET BY MOUTH EVERY DAY   cycloSPORINE modified 100 MG Caps Take 100-125 mg by mouth 2 (two) times daily. Take 100 mg in the AM and 125 mg in the PM   cycloSPORINE modified 25 MG capsule Commonly known as: NEORAL Use in combination with 100 mg capsules for PM dose of 125 mg.   diclofenac sodium 1 % Gel Commonly known as: VOLTAREN Apply 4 g topically 4 (four) times daily.   divalproex 500 MG 24 hr tablet Commonly known as: DEPAKOTE ER Take 2 tablets (1,000  mg total) by mouth at bedtime.   doxepin 10 MG capsule Commonly known as: SINEQUAN Take 25 mg by mouth at bedtime.   doxepin 25 MG capsule Commonly known as: SINEQUAN TK 1 C PO HS   FIBER (GUAR GUM) PO Take by mouth.   fluticasone 50 MCG/ACT nasal spray Commonly known as: FLONASE Place 2 sprays into both nostrils daily.   glucose blood test strip Commonly known as: Accu-Chek Guide Use to check blood sugar once daily or as directed.   hydrocortisone valerate cream 0.2 % Commonly known as: WESTCORT APPLY 1 APPLICATION TOPICALLY TO AFFECTED AREAS ON HANDS AND FEET BID PRN FOR DERMATITIS   hydroxypropyl methylcellulose / hypromellose 2.5 % ophthalmic solution Commonly known as: ISOPTO TEARS / GONIOVISC Place 1 drop into the left eye 4 (four) times daily as needed for dry eyes.   Lac-Hydrin 12 % cream Generic drug: ammonium lactate Apply topically 2 (two) times daily.   magnesium oxide 400 MG tablet Commonly known as: MAG-OX Take 400 mg by mouth 2 (two) times daily. 5pm & 7pm.   mometasone 0.1 % cream Commonly known as: Elocon Apply 1 application topically 2 (two) times daily.   montelukast 10 MG tablet Commonly known as: SINGULAIR TAKE 1 TABLET BY MOUTH AT BEDTIME   multivitamin tablet Take 1 tablet by mouth daily. 9 am.   mycophenolate 250 MG capsule Commonly known as: CELLCEPT Take 500 mg by mouth 2 (two) times daily. 9 am & 9 pm.   Nebulizer/Tubing/Mouthpiece Kit 2 each by Does not apply route as directed.   Nebulizers Misc 1 kit by Does not apply route as directed.   omeprazole 40 MG capsule Commonly known as: PRILOSEC Take 40 mg by mouth daily. 9 am.   Perforomist 20 MCG/2ML nebulizer solution Generic drug: formoterol Take 2 mLs (20 mcg total) by nebulization 2 (two) times daily.   polyethylene glycol 17 g packet Commonly known as: MIRALAX / GLYCOLAX Take 17 g by mouth daily as needed (for constipation). In water or juice   prazosin 1 MG capsule  Commonly known as: MINIPRESS Take 2 mg by mouth at bedtime.   ProAir HFA 108 (90 Base) MCG/ACT inhaler Generic drug: albuterol Inhale 1 puff into the lungs every 4 (four) hours as needed for shortness of breath.   albuterol (2.5 MG/3ML) 0.083% nebulizer solution Commonly known as: PROVENTIL 1 vial via nebulizer every 4-6 hours as needed.   RA Vitamin D-3 50 MCG (2000 UT) Caps Generic drug: Cholecalciferol Take 1 capsule by mouth daily.   Refresh Tears 0.5 % Soln Generic drug: carboxymethylcellulose Place 1 drop into both eyes 4 (four) times daily as needed (for dry eyes).   risperiDONE 3 MG tablet Commonly known as: RISPERDAL Take 3 mg by mouth at bedtime.   triamcinolone cream 0.1 % Commonly known as: KENALOG Apply 1 application topically  once a week.       Past Medical History:  Diagnosis Date  . Abnormal vaginal bleeding   . Allergy   . Anemia   . Arthritis    right knee  . Asthma 09-23-12   Asthma somewhat causing increase wheezing and mild congestion at present  . Biliary cirrhosis ([place]) 04/26/11  . Constipation   . DUB (dysfunctional uterine bleeding)   . GERD (gastroesophageal reflux disease)   . Intellectual disability   . Liver transplant recipient [facility])   . PONV (postoperative nausea and vomiting)   . 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 Seizures ([place])    congenital epilepsy-hx. seizures  . Vitamin D deficiency     Past Surgical History:  Procedure Laterality Date  . adenonoidectomy    . APPENDECTOMY    . CHOLECYSTECTOMY    . COLONOSCOPY W/ BIOPSIES    . ESOPHAGOGASTRODUODENOSCOPY (EGD) WITH PROPOFOL N/A 10/01/2012   Procedure: ESOPHAGOGASTRODUODENOSCOPY (EGD) WITH PROPOFOL;  Surgeon: Arta Silence, MD;  Location: [facility];  Service: Endoscopy;  Laterality: N/A;  . eustacean tubes    . EYE MUSCLE SURGERY    . LIVER TRANSPLANT      Review of systems negative except as noted in HPI / PMHx or noted below:  Review of Systems   Constitutional: Negative.   HENT: Negative.   Eyes: Negative.   Respiratory: Negative.   Cardiovascular: Negative.   Gastrointestinal: Negative.   Genitourinary: Negative.   Musculoskeletal: Negative.   Skin: Negative.   Neurological: Negative.   Endo/Heme/Allergies: Negative.   Psychiatric/Behavioral: Negative.      Objective:   Vitals:   01/06/19 1053  BP: 106/76  Pulse: 99  Resp: 16  Temp: 97.9 F (36.6 C)  SpO2: 99%   Height: '5\' 4"'  (162.6 cm)      Physical Exam Constitutional:      Appearance: She is not diaphoretic.  HENT:     Head: Normocephalic.     Right Ear: Tympanic membrane, ear canal and external ear normal.     Left Ear: Tympanic membrane, ear canal and external ear normal.     Nose: Nose normal. No mucosal edema or rhinorrhea.     Mouth/Throat:     Pharynx: Uvula midline. No oropharyngeal exudate.  Eyes:     Conjunctiva/sclera: Conjunctivae normal.  Neck:     Thyroid: No thyromegaly.     Trachea: Trachea normal. No tracheal tenderness or tracheal deviation.  Cardiovascular:     Rate and Rhythm: Normal rate and regular rhythm.     Heart sounds: Normal heart sounds, S1 normal and S2 normal. No murmur.  Pulmonary:     Effort: No respiratory distress.     Breath sounds: Normal breath sounds. No stridor. No wheezing or rales.  Lymphadenopathy:     Head:     Right side of head: No tonsillar adenopathy.     Left side of head: No tonsillar adenopathy.     Cervical: No cervical adenopathy.  Skin:    Findings: No erythema or rash.     Nails: There is no clubbing.   Neurological:     Mental Status: She is alert.     Diagnostics:    Spirometry was performed and demonstrated an FEV1 of 1.51 at 61 % of predicted.  Assessment and Plan:   1. Not well controlled moderate persistent asthma   2. Other allergic rhinitis   3. Inflammatory dermatosis   4. LPRD (laryngopharyngeal reflux disease)  1. Continue Symbicort 160 2 inhalations twice a  day  2. Continue montelukast 10 mg one tablet once a day  3. Continue nasal Azelastine and fluticasone 1 spray each nostril twice a day  4. Continue cetirizine 10 mg one tablet once a day  5. Continue ProAir HFA 2 puffs every 4-6 hours if needed  6. Continue Mometasone 0.1% ointment 1-2 times a day if needed   7. Treat reflux:   A. Use decaf coffee in the smoothie  B. Continue omeprazole 40 mg 2 times per day  C. Start famotidine 40 mg in evening  8. Return to clinic in 3 months or earlier if problem  9. Obtain fall flu vaccine (and COVID vaccine)   I am going to keep Chakita on a combination of therapy directed against respiratory tract inflammation as noted above and if needed some anti-inflammatory agents for her skin.  She does have some reflux that is still active in the setting of using omeprazole and she may have a component of LPR.  We will get her to switch out her coffee to decaf and have her start famotidine.  I will see her back in this clinic in 3 months or earlier if there is a problem.  Allena Katz, MD Allergy / Immunology [place]

## 2019-01-07 ENCOUNTER — Telehealth: Payer: Self-pay

## 2019-01-07 ENCOUNTER — Encounter: Payer: Self-pay | Admitting: Allergy and Immunology

## 2019-01-07 ENCOUNTER — Telehealth: Payer: Self-pay | Admitting: Allergy and Immunology

## 2019-01-07 MED ORDER — OMEPRAZOLE 40 MG PO CPDR: 40.0000 mg | DELAYED_RELEASE_CAPSULE | Freq: Two times a day (BID) | ORAL | 5 refills | Status: DC

## 2019-01-07 MED ORDER — FAMOTIDINE 40 MG PO TABS: 40.0000 mg | ORAL_TABLET | Freq: Every evening | ORAL | 5 refills | Status: DC

## 2019-01-07 NOTE — Telephone Encounter (Signed)
Pt calling asking if she should refrigerate nebulizer solution.  Explained to patient that it needed to be left out at room temperature.  Pt verbalized understanding, call ended.

## 2019-01-07 NOTE — Telephone Encounter (Signed)
Left a detailed message per dpr that meds were sent.

## 2019-01-07 NOTE — Telephone Encounter (Signed)
Pt called and said that dr Neldon Mc was going to have omeprazole 40mg  and famotidine 40 to [facility] [address]. [phone].

## 2019-01-19 NOTE — Progress Notes (Signed)
Currently following with an ophthalmologist, saw her a few weeks ago  Subjective:    Patient ID: [name], female    DOB: 07-07-1975, 43 y.o.   MRN: [id number]   CC: f/u Bell's palsy and back/chin/neck pain  HPI: Ms. [name] is a 43 year old female with a history of liver transplant, thrombocytopenia, seizure disorder, schizophrenia, and anemia presenting discuss the following:  Bell's palsy: Diagnosed in July 2020, left-sided.  Had a negative brain MRI at that time.  She has been doing well, however has noted minimal change in her facial strength.  She can close her left eye all the way now.  Still using teardrops as needed.  Follows with an ophthalmologist who endorsed no eye abnormalities or any dry eye.  She will be seeing her in September.  She would like to see if is possible to get physical therapy to help with facial movement.  Chin/back pain: Fell yesterday when coming out of bathroom.  She had recently just placed wet Clorox on the floor to help with an ant problem.  As she was trying to avoid the Clorox, she unfortunately stepped in a wet region and slipped.  She hit the bottom of her chin on to the sidewall and then fell onto the ground on her abdomen.  Landed on tile.  Was able to get herself up, no precipitating chest pain, S OB, lightheadedness/dizziness.  No LOC with event.  She has been doing okay since then, however starting last night into this morning, her chin, left neck, and middle/lower back are now sore.  Describes as a dull ache.  No bruises or deformities that she is noted.  Denies any weakness, numbness, bowel or bladder dysfunction.  She has not tried anything for the pain, does not take ibuprofen due to previous stomach ulcers.   Additionally, at the end of the visit she states she would like to have her hemoglobin checked, she was told by her hepatologist that it was low. Hemoglobin was 9.5 on 11/2018.  Smoking status reviewed  Review of Systems Per HPI, also denies  recent illness, fever, headache, changes in vision, chest pain, shortness of breath, abdominal pain, N/V/D, weakness   Patient Active Problem List   Diagnosis Date Noted  . Fall at home, initial encounter 01/21/2019  . Left-sided Bell's palsy 12/22/2018  . Seasonal allergies 11/04/2018  . Liver transplant recipient [facility]) 09/26/2018  . Sinusitis, acute 06/16/2015  . Lumbar back pain 04/06/2014  . Health care maintenance 03/06/2014  . 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 Thrombocytopenia ([place]) 03/09/2013  . Seizure disorder ([place]) 03/01/2013  . Schizophrenia, paranoid type ([place]) 06/25/2012    Class: Chronic  . 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 Menorrhagia 12/22/2010  . ANEMIA 03/03/2010  . GERD 07/21/2008  . CARDIAC FLOW MURMUR 10/16/2007  . Convulsions/seizures ([place]) 08/23/2006  . Asthma with allergic rhinitis 07/30/2006  . Morbid obesity ([place]) 07/25/2006  . Mental retardation 07/25/2006  . OSTEOARTHRITIS, LOWER LEG 07/25/2006     Objective:  BP 105/80   Pulse 98   Temp 98 F (36.7 C) (Oral)   Wt 208 lb (94.3 kg)   LMP 12/25/2018   SpO2 99%   BMI 35.70 kg/m  Vitals and nursing note reviewed  General: NAD, pleasant, comfortable HEENT: MMM. Left facial droop including forehead. Bilateral conjunctiva clear. Able to open/close jaw without concern. Lateral gaze of right eye.  Cardiac: RRR, normal heart sounds, no murmurs Respiratory: CTAB, normal effort Abdomen: soft, nontender, nondistended Extremities:  no edema or cyanosis. WWP. Skin: warm and dry, no rashes noted MSK: No deformities, rashes, or bruising noted to chin, neck, thoracic/lumbar spine, or abdomen. Chin w/ tender to palpation mildly. No bony abnormalities palpated on mandibular bone or spine. 5/5 upper and lower extremity strength bilaterally. Full ROM of cervical/thoracic/lumbar spine, however some soreness with R rotation of neck. Gait normal. Spurling's test negative bilaterally. No spinal process  tenderness with palpation throughout.  Neuro: alert and oriented, Uneven smile due to left sided facial droop. Able to close left eyelid fully with effort. Sensation to light touch throughout face and extremities intact.  Psych: normal affect  Assessment & Plan:   Left-sided Bell's palsy Mild improvement, now able to fully close left eyelid. Finished prednisone taper. Following with ophthalmology, no evidence of corneal injury in most recent visit.  --Provided home facial exercises  --Placed referral for neuro rehab/PT per patient request  --May continue to tape eyelid shut given effort needed for full closure on exam  --Cont following with ophthalmology  --Return precautions discussed   Fall at home, initial encounter Low level fall after slipping on wet floor without any precipitating factors, associated with chin, neck, and thoracic/lumbar spine soreness. Reassured physical exam unremarkable with the exception of soreness with R neck rotation, rotation of thoracic/lumbar spine, and palpation of chin without any concurrent bruising or bony abnormalities palpated. Suspect soreness is in the setting of fall with low concern for underlying fractures given above.   --Tylenol 650mg  every 4-6 hours scheduled for the next few days  --Ice/Heat on neck/back 20 minutes at a time, few times a day  --Rest, may stretch and walk  --Return precautions discussed including new onset numbness, weakness, bowel/bladder incontinence, inability to move jaw etc  --Follow up in the next few weeks if not improving or sooner if worsening   ANEMIA Chronic, normocytic. Baseline appears around 9-10 over the past several years. Suspect in the setting of chronic disease, h/o PBC s/p liver transplant. Patient was told it was low recently during previous office visit, wanted to have this rechecked today. Denies any signs of bleeding.  --CBC w/ diff, retic count    F/u if symptoms not improving or sooner if worsening, in  addition to routine f/u for chronic conditions.   [facility] Resident PGY-2

## 2019-01-20 ENCOUNTER — Encounter: Payer: Self-pay | Admitting: Family Medicine

## 2019-01-20 ENCOUNTER — Ambulatory Visit: Payer: Medicaid Other | Admitting: Family Medicine

## 2019-01-20 ENCOUNTER — Other Ambulatory Visit: Payer: Self-pay

## 2019-01-20 VITALS — BP 105/80 | HR 98 | Temp 98.0°F | Wt 208.0 lb

## 2019-01-20 DIAGNOSIS — W19XXXA Unspecified fall, initial encounter: Secondary | ICD-10-CM

## 2019-01-20 DIAGNOSIS — Y92009 Unspecified place in unspecified non-institutional (private) residence as the place of occurrence of the external cause: Secondary | ICD-10-CM

## 2019-01-20 DIAGNOSIS — D638 Anemia in other chronic diseases classified elsewhere: Secondary | ICD-10-CM | POA: Diagnosis not present

## 2019-01-20 DIAGNOSIS — G510 Bell's palsy: Secondary | ICD-10-CM

## 2019-01-20 DIAGNOSIS — Z23 Encounter for immunization: Secondary | ICD-10-CM | POA: Diagnosis not present

## 2019-01-20 NOTE — Patient Instructions (Signed)
Wonderful to see you today.  1.  For your chin and back pain, please try scheduled Tylenol 650 mg every 4-6 hours for about the next week with heat on your back and neck for 20 minutes at a time a few times a day.  Try to take it easy on your body, work on your range of motion and stretches.  Please follow-up within the next several weeks if this is not improving.  If you develop any new onset numbness, weakness, changes in your bowel or bladder function please seek medical care.  2.  I will contact physical therapy to see if they would be able to help with Bell's palsy.  Here are some facial movements to work on a few times a day in the meantime:  Jaw and mouth movements Lip closure Eye movements Smiling Frowning Facial expressions Forehead wrinkling

## 2019-01-21 ENCOUNTER — Encounter: Payer: Self-pay | Admitting: Family Medicine

## 2019-01-21 DIAGNOSIS — W19XXXA Unspecified fall, initial encounter: Secondary | ICD-10-CM | POA: Insufficient documentation

## 2019-01-21 DIAGNOSIS — Y92009 Unspecified place in unspecified non-institutional (private) residence as the place of occurrence of the external cause: Secondary | ICD-10-CM | POA: Insufficient documentation

## 2019-01-21 HISTORY — DX: Unspecified place in unspecified non-institutional (private) residence as the place of occurrence of the external cause: Y92.009

## 2019-01-21 HISTORY — DX: Unspecified fall, initial encounter: W19.XXXA

## 2019-01-21 LAB — CBC WITH DIFFERENTIAL/PLATELET
Basophils Absolute: 0 10*3/uL (ref 0.0–0.2)
Basos: 1 %
EOS (ABSOLUTE): 0.1 10*3/uL (ref 0.0–0.4)
Eos: 4 %
Hematocrit: 32.1 % — ABNORMAL LOW (ref 34.0–46.6)
Hemoglobin: 10.4 g/dL — ABNORMAL LOW (ref 11.1–15.9)
Immature Grans (Abs): 0 10*3/uL (ref 0.0–0.1)
Immature Granulocytes: 1 %
Lymphocytes Absolute: 1.3 10*3/uL (ref 0.7–3.1)
Lymphs: 48 %
MCH: 28.3 pg (ref 26.6–33.0)
MCHC: 32.4 g/dL (ref 31.5–35.7)
MCV: 88 fL (ref 79–97)
Monocytes Absolute: 0.4 10*3/uL (ref 0.1–0.9)
Monocytes: 15 %
Neutrophils Absolute: 0.9 10*3/uL — ABNORMAL LOW (ref 1.4–7.0)
Neutrophils: 31 %
Platelets: 129 10*3/uL — ABNORMAL LOW (ref 150–450)
RBC: 3.67 x10E6/uL — ABNORMAL LOW (ref 3.77–5.28)
RDW: 14.5 % (ref 11.7–15.4)
WBC: 2.7 10*3/uL — ABNORMAL LOW (ref 3.4–10.8)

## 2019-01-21 LAB — RETICULOCYTES: Retic Ct Pct: 1.4 % (ref 0.6–2.6)

## 2019-01-21 NOTE — Assessment & Plan Note (Addendum)
Chronic, normocytic. Baseline appears around 9-10 over the past several years. Suspect in the setting of chronic disease, h/o PBC s/p liver transplant. Patient was told it was low recently during previous office visit, wanted to have this rechecked today. Denies any signs of bleeding.  --CBC w/ diff, retic count

## 2019-01-21 NOTE — Assessment & Plan Note (Signed)
Mild improvement, now able to fully close left eyelid. Finished prednisone taper. Following with ophthalmology, no evidence of corneal injury in most recent visit.  --Provided home facial exercises  --Placed referral for neuro rehab/PT per patient request  --May continue to tape eyelid shut given effort needed for full closure on exam  --Cont following with ophthalmology  --Return precautions discussed

## 2019-01-21 NOTE — Assessment & Plan Note (Signed)
Low level fall after slipping on wet floor without any precipitating factors, associated with chin, neck, and thoracic/lumbar spine soreness. Reassured physical exam unremarkable with the exception of soreness with R neck rotation, rotation of thoracic/lumbar spine, and palpation of chin without any concurrent bruising or bony abnormalities palpated. Suspect soreness is in the setting of fall with low concern for underlying fractures given above.   --Tylenol 650mg  every 4-6 hours scheduled for the next few days  --Ice/Heat on neck/back 20 minutes at a time, few times a day  --Rest, may stretch and walk  --Return precautions discussed including new onset numbness, weakness, bowel/bladder incontinence, inability to move jaw etc  --Follow up in the next few weeks if not improving or sooner if worsening

## 2019-02-03 ENCOUNTER — Other Ambulatory Visit: Payer: Self-pay | Admitting: Family Medicine

## 2019-02-11 ENCOUNTER — Telehealth: Payer: Self-pay | Admitting: Family Medicine

## 2019-02-11 NOTE — Telephone Encounter (Signed)
Called patient, informed of her results.  Additionally let her know that she can always check these through her MyChart.  [name], DO

## 2019-02-11 NOTE — Telephone Encounter (Signed)
Pt is calling and would like for someone to with the results from her last appointment.

## 2019-02-16 ENCOUNTER — Other Ambulatory Visit: Payer: Self-pay

## 2019-02-16 MED ORDER — DIVALPROEX SODIUM ER 500 MG PO TB24: 1000.0000 mg | ORAL_TABLET | Freq: Every day | ORAL | 3 refills | Status: DC

## 2019-02-16 NOTE — Telephone Encounter (Signed)
Patient is requesting a refill on her divalproex (DEPAKOTE ER) 500 MG 24 hr tablet

## 2019-02-18 ENCOUNTER — Ambulatory Visit: Payer: Medicaid Other | Admitting: Family Medicine

## 2019-02-20 ENCOUNTER — Other Ambulatory Visit: Payer: Self-pay

## 2019-02-20 ENCOUNTER — Ambulatory Visit: Payer: Medicaid Other | Admitting: Family Medicine

## 2019-02-20 ENCOUNTER — Encounter: Payer: Self-pay | Admitting: Family Medicine

## 2019-02-20 VITALS — BP 100/80 | HR 92 | Temp 97.5°F | Wt 213.2 lb

## 2019-02-20 DIAGNOSIS — F444 Conversion disorder with motor symptom or deficit: Secondary | ICD-10-CM

## 2019-02-20 DIAGNOSIS — K743 Primary biliary cirrhosis: Secondary | ICD-10-CM | POA: Diagnosis not present

## 2019-02-20 NOTE — Progress Notes (Signed)
Subjective:    Patient ID: [name], female    DOB: 12/21/1975, 42 y.o.   MRN: [id number]   CC: "Shivering"   HPI: [name] is a 43 year old female with [name] s/p liver transplant on chronic immunosuppressive therapy, schizophrenia with intellectual disability, seizure disorder, Bell's palsy, asthma presenting discuss the following:  Shivering: Endorses a quick onset of shivering for the past week.  Bilateral upper and lower extremities.  States she usually notices it when she wakes up in the morning and then when she turned on the heat it goes away.  The day that she left the heat on overnight she did not have any shivering when she woke up in the morning.  States she keeps her house at a reasonable temperature, however is not sure what she keeps it at.  Checked her sugar this morning when she was shivering, 125.  Also during conversation, notes that if she takes a few deep breaths it seems to resolve as well.  Denies having any fever, dysuria, abdominal pains, N/V, rash, changes in bowel movements, shortness of breath, seizure-like activity, weakness, melena/hematochezia.  Does endorse a nonproductive cough for the past few weeks that seems to be worse at night.  Recently increased on her GERD medication which seems to be helping this.  No known COVID exposures.  No recent medication changes, does follow-up with psychiatry on a regular basis, on Cogentin, Depakote, risperidone.  No previous history of thyroid concerns.  Not feeling overly anxious, no recent stressors that she is aware of.   Also would like to have her blood level rechecked per request of her hepatologist.   Smoking status reviewed  Review of Systems Per HPI    Objective:  BP 100/80   Pulse 92   Temp (!) 97.5 F (36.4 C)   Wt 213 lb 3.2 oz (96.7 kg)   LMP 11/26/2018 (Approximate)   SpO2 98%   BMI 36.60 kg/m  Vitals and nursing note reviewed  General: NAD, pleasant HEENT: Mucous membranes moist, disconjugate  gaze that is chronic Cardiac: RRR, normal heart sounds Respiratory: CTAB, normal effort Abdomen: soft, nontender, nondistended, normoactive bowel sounds in all 4 quadrants Extremities: no edema or cyanosis. WWP. Skin: warm and dry, no rashes noted Neuro: Alert and oriented.  Speech appropriate.  Smile almost resolved back to fully symmetric after recent Bell's palsy.  Note very mild tremor with activity and rest of bilateral arms and legs, can improve/resolve with several deep breaths in the room. 5/5 upper and lower extremity strength.  Normal gait.  Sensation to light touch throughout face and extremities. Psych: normal affect  Assessment & Plan:   Psychogenic tremor 1 week history of sudden onset " shivering" in bilateral upper/lower extremities that resolved with increasing room temperature and deep breaths, in the setting of known schizophrenia and intellectual disability on Depakote, risperidone, and Cogentin.  Reassuringly afebrile and neurological intact on exam.  Ultimately suspect psychogenic/functional tremors given atypical presentation as above.  Could also consider antipsychotic medication EPS. Low concern for organic etiology such as hypoglycemia, hypothermia, or thyroid dysfunction given temperature wnl and no previous history of thyroid disorder/diabetes, however will evaluate with TSH and CMP today. Considering worsening anemia given known pancytopenia in setting of liver transplant/medications, will recheck today.  Lastly, considered infectious process precipitating rigors, however patient was without any concerning s/sx suggestive of any urinary, pulmonary, GI, or dermatological infections. - CBC, CMP, TSH - Recommended patient follow-up with her psychiatrist for any medical management  as appropriate - Encouraged to continue taking deep breaths periodically and maintaining house at appropriate temperature - Follow-up if not improving or worsening, to continue monitoring for any  infectious s/sx or neurological changes   [place] Resident PGY-2

## 2019-02-20 NOTE — Patient Instructions (Signed)
It was wonderful seeing you today.  We are going to check some labs today, I will let you know the results within the next week.  Please make sure you are keeping your house temperature around 44F or to your preference.  Additionally make sure that you are calming yourself with deep breaths to help with your shivering.  I would like you to touch base with your psychiatrist to make sure there is not any medication management needed.   Please follow-up if this is not getting better or sooner if worsening, burning when you pee, fever, worsening cough/shortness of breath, back pain.

## 2019-02-21 ENCOUNTER — Encounter: Payer: Self-pay | Admitting: Family Medicine

## 2019-02-21 DIAGNOSIS — F444 Conversion disorder with motor symptom or deficit: Secondary | ICD-10-CM | POA: Insufficient documentation

## 2019-02-21 HISTORY — DX: Conversion disorder with motor symptom or deficit: F44.4

## 2019-02-21 LAB — COMPREHENSIVE METABOLIC PANEL
ALT: 11 IU/L (ref 0–32)
AST: 19 IU/L (ref 0–40)
Albumin/Globulin Ratio: 1.5 (ref 1.2–2.2)
Albumin: 4 g/dL (ref 3.8–4.8)
Alkaline Phosphatase: 58 IU/L (ref 39–117)
BUN/Creatinine Ratio: 19 (ref 9–23)
BUN: 18 mg/dL (ref 6–24)
Bilirubin Total: 0.4 mg/dL (ref 0.0–1.2)
CO2: 20 mmol/L (ref 20–29)
Calcium: 8.7 mg/dL (ref 8.7–10.2)
Chloride: 105 mmol/L (ref 96–106)
Creatinine, Ser: 0.96 mg/dL (ref 0.57–1.00)
GFR calc Af Amer: 83 mL/min/{1.73_m2} (ref >59)
GFR calc non Af Amer: 72 mL/min/{1.73_m2} (ref >59)
Globulin, Total: 2.6 g/dL (ref 1.5–4.5)
Glucose: 97 mg/dL (ref 65–99)
Potassium: 4.3 mmol/L (ref 3.5–5.2)
Sodium: 138 mmol/L (ref 134–144)
Total Protein: 6.6 g/dL (ref 6.0–8.5)

## 2019-02-21 LAB — CBC WITH DIFFERENTIAL/PLATELET
Basophils Absolute: 0 10*3/uL (ref 0.0–0.2)
Basos: 1 %
EOS (ABSOLUTE): 0.1 10*3/uL (ref 0.0–0.4)
Eos: 3 %
Hematocrit: 28 % — ABNORMAL LOW (ref 34.0–46.6)
Hemoglobin: 9.1 g/dL — ABNORMAL LOW (ref 11.1–15.9)
Immature Grans (Abs): 0 10*3/uL (ref 0.0–0.1)
Immature Granulocytes: 0 %
Lymphocytes Absolute: 1.5 10*3/uL (ref 0.7–3.1)
Lymphs: 45 %
MCH: 28.3 pg (ref 26.6–33.0)
MCHC: 32.5 g/dL (ref 31.5–35.7)
MCV: 87 fL (ref 79–97)
Monocytes Absolute: 0.5 10*3/uL (ref 0.1–0.9)
Monocytes: 14 %
Neutrophils Absolute: 1.3 10*3/uL — ABNORMAL LOW (ref 1.4–7.0)
Neutrophils: 37 %
Platelets: 127 10*3/uL — ABNORMAL LOW (ref 150–450)
RBC: 3.22 x10E6/uL — ABNORMAL LOW (ref 3.77–5.28)
RDW: 14.4 % (ref 11.7–15.4)
WBC: 3.4 10*3/uL (ref 3.4–10.8)

## 2019-02-21 LAB — TSH: TSH: 10.1 u[IU]/mL — ABNORMAL HIGH (ref 0.450–4.500)

## 2019-02-21 NOTE — Assessment & Plan Note (Addendum)
1 week history of sudden onset " shivering" in bilateral upper/lower extremities that resolved with increasing room temperature and deep breaths, in the setting of known schizophrenia and intellectual disability on Depakote, risperidone, and Cogentin.  Reassuringly afebrile and neurological intact on exam.  Ultimately suspect psychogenic/functional tremors given atypical presentation as above.  Could also consider antipsychotic medication EPS. Low concern for organic etiology such as hypoglycemia, hypothermia, or thyroid dysfunction given temperature wnl and no previous history of thyroid disorder/diabetes, however will evaluate with TSH and CMP today. Considering worsening anemia given known pancytopenia in setting of liver transplant/medications, will recheck today.  Lastly, considered infectious process precipitating rigors, however patient was without any concerning s/sx suggestive of any urinary, pulmonary, GI, or dermatological infections. - CBC, CMP, TSH - Recommended patient follow-up with her psychiatrist for any medical management as appropriate - Encouraged to continue taking deep breaths periodically and maintaining house at appropriate temperature - Follow-up if not improving or worsening, to continue monitoring for any infectious s/sx or neurological changes

## 2019-02-23 ENCOUNTER — Other Ambulatory Visit: Payer: Self-pay | Admitting: Family Medicine

## 2019-02-23 DIAGNOSIS — F444 Conversion disorder with motor symptom or deficit: Secondary | ICD-10-CM

## 2019-02-24 LAB — T4, FREE: Free T4: 1.46 ng/dL (ref 0.82–1.77)

## 2019-02-24 LAB — SPECIMEN STATUS REPORT

## 2019-02-25 ENCOUNTER — Telehealth: Payer: Self-pay | Admitting: Family Medicine

## 2019-02-25 NOTE — Telephone Encounter (Signed)
Patient needs results from labwork last week.  She also wanted to ask about her missing her period the last couple of months, and she has been feeling like she is getting hot flashes.   [phone].

## 2019-02-26 ENCOUNTER — Other Ambulatory Visit: Payer: Self-pay | Admitting: Family Medicine

## 2019-02-26 ENCOUNTER — Encounter: Payer: Self-pay | Admitting: Family Medicine

## 2019-02-26 DIAGNOSIS — E039 Hypothyroidism, unspecified: Secondary | ICD-10-CM

## 2019-02-26 DIAGNOSIS — E038 Other specified hypothyroidism: Secondary | ICD-10-CM

## 2019-02-26 MED ORDER — LEVOTHYROXINE SODIUM 25 MCG PO TABS: 25.0000 ug | ORAL_TABLET | Freq: Every day | ORAL | 0 refills | Status: DC

## 2019-02-26 NOTE — Telephone Encounter (Signed)
Called patient to discuss results. Note subclinical hypothyroidism, with TSH >10, T4 normal. Previous history of subclinical hypothyroid, does not appear to be treated in the past. Suspect polypharmacy induced, however will continue to review past history and medications etc. Suspect may be mildly symptomatic with cold intolerance, mild fatigue --> will start low dose synthroid at 25mcg.   Additionally discussed stable anemia, thrombocytopenia. Will continue to monitor. Patient will follow up within the next month, will repeat TSH in 6 weeks.   [name], DO

## 2019-03-18 ENCOUNTER — Other Ambulatory Visit: Payer: Self-pay | Admitting: Family Medicine

## 2019-03-18 DIAGNOSIS — E038 Other specified hypothyroidism: Secondary | ICD-10-CM

## 2019-03-18 DIAGNOSIS — E039 Hypothyroidism, unspecified: Secondary | ICD-10-CM

## 2019-03-20 ENCOUNTER — Encounter: Payer: Self-pay | Admitting: Family Medicine

## 2019-03-20 ENCOUNTER — Other Ambulatory Visit: Payer: Self-pay

## 2019-03-20 ENCOUNTER — Ambulatory Visit: Payer: Medicaid Other | Admitting: Family Medicine

## 2019-03-20 VITALS — BP 104/78 | HR 88 | Wt 212.6 lb

## 2019-03-20 DIAGNOSIS — M79672 Pain in left foot: Secondary | ICD-10-CM

## 2019-03-20 DIAGNOSIS — M25562 Pain in left knee: Secondary | ICD-10-CM

## 2019-03-20 DIAGNOSIS — Z944 Liver transplant status: Secondary | ICD-10-CM

## 2019-03-20 MED ORDER — MELOXICAM 15 MG PO TABS: 15.0000 mg | ORAL_TABLET | Freq: Every day | ORAL | 0 refills | Status: DC

## 2019-03-20 NOTE — Patient Instructions (Addendum)
It was great meeting you today!  I think that your left foot and left knee pain are separate processes related to the same injury.  I think you initially got patellar tendinitis which explains your knee pain.  I believe you walk a little bit differently for a few days which caused you to have your foot pain.  Specifically I think you have inflammation of your extensor digitorum longus tendon.  Fortunately that treatment for both these issues is very similar.  I gave you some rehab exercises and stretches to do for both your patellar tendinitis, which is your knee problem, and your extensor digitorum longus tendinitis, which is your foot problem.  He can take Tylenol 325 mg every 6 hours as needed for the pain.  This should be helpful for decreasing some of the inflammation these tendons.  I would recommend icing the areas for 15 minutes 2 times per day.  Lastly I gave you a handout for a topical medication you can put on the area called Voltaren gel.  I would give these treatments a couple of weeks and see how they do.  If you are still having some issues you can either follow-up with me or your PCP as needed.

## 2019-03-21 LAB — COMPREHENSIVE METABOLIC PANEL
ALT: 8 IU/L (ref 0–32)
AST: 18 IU/L (ref 0–40)
Albumin/Globulin Ratio: 1.3 (ref 1.2–2.2)
Albumin: 3.9 g/dL (ref 3.8–4.8)
Alkaline Phosphatase: 65 IU/L (ref 39–117)
BUN/Creatinine Ratio: 19 (ref 9–23)
BUN: 24 mg/dL (ref 6–24)
Bilirubin Total: 0.3 mg/dL (ref 0.0–1.2)
CO2: 21 mmol/L (ref 20–29)
Calcium: 8.9 mg/dL (ref 8.7–10.2)
Chloride: 105 mmol/L (ref 96–106)
Creatinine, Ser: 1.24 mg/dL — ABNORMAL HIGH (ref 0.57–1.00)
GFR calc Af Amer: 61 mL/min/{1.73_m2} (ref >59)
GFR calc non Af Amer: 53 mL/min/{1.73_m2} — ABNORMAL LOW (ref >59)
Globulin, Total: 3.1 g/dL (ref 1.5–4.5)
Glucose: 93 mg/dL (ref 65–99)
Potassium: 4.6 mmol/L (ref 3.5–5.2)
Sodium: 140 mmol/L (ref 134–144)
Total Protein: 7 g/dL (ref 6.0–8.5)

## 2019-03-24 NOTE — Progress Notes (Signed)
   HPI 43 year old female who presents for left foot and left knee pain.  Patient states that her knee pain started first and she characterizes as a anterior knee discomfort while walking.  Has been going on for a couple of weeks.  She has noted some mild swelling in this area as well.  She is not taking thing for the pain.  A few days after the knee pain started she did developed mild pain over the medial dorsal surface.  The pain appears to proclaim your distribution of the separate parts of the extensor digitorum longus tendon.  She has not noticed any bruising in these areas.  The pain is about a 4 out of 10 for both areas.  CC: Left knee, left foot pain   ROS:   Review of Systems See HPI for ROS.   CC, SH/smoking status, and VS noted  Objective: BP 104/78   Pulse 88   Wt 212 lb 9.6 oz (96.4 kg)   SpO2 100%   BMI 36.49 kg/m  Gen: Pleasant 43 year old African-American female, no acute distress CV: Skin warm and dry Resp: No sensory muscle use, no distress Neuro: Alert and oriented, Speech clear, No gross deficits Left knee: Very minimal swelling noted, felt to the outside the joint.  Mild tenderness to palpation patellar tendon.  5/5 strength knee extension, flexion.  No range of motion limitation Left foot: Mild swelling noted medial dorsal foot.  Tenderness to palpation over 4 distal aspects of extensor digitorum longus tendon.  5/5 strength to foot plantar and dorsiflexion.  No range of motion limitation.   Assessment and plan:  Knee pain Likely patellar tendinitis given exam findings and history.  Unclear exactly what inciting event was.  Gave rehab stretches.  Given history of liver transplant will not do any NSAIDs.  Can take Tylenol as needed for pain.  Discussed that her limitation is 2 g daily.  Also discussed icing the area 15 minutes 3 times per day.  Can take topical liniments as needed.  Foot pain, left Likely secondary to extensor digitorum longus tendinitis.  I  believe she subtley altered her gait to compensate for her left knee patellar tendinitis.  Gave foot and ankle rehabilitation exercises to do.  Can take Tylenol.  Given history of liver transplant no NSAIDs, 2 g daily limitation of Tylenol.  Recommend icing the area for 15 minutes 2 times per day.  Liver transplant recipient [facility]) Check CMP at patient request.  LFTs all look great normality.  Follow-up with transplant team as scheduled.   Orders Placed This Encounter  Procedures  . Comprehensive metabolic panel    Order Specific Question:   Has the patient fasted?    Answer:   No    Meds ordered this encounter  Medications  . DISCONTD: meloxicam (MOBIC) 15 MG tablet    Sig: Take 1 tablet (15 mg total) by mouth daily.    Dispense:  10 tablet    Refill:  0     Guadalupe Dawn MD PGY-3 Family Medicine Resident  03/25/2019 12:56 PM

## 2019-03-25 ENCOUNTER — Encounter: Payer: Self-pay | Admitting: Family Medicine

## 2019-03-25 DIAGNOSIS — M25569 Pain in unspecified knee: Secondary | ICD-10-CM | POA: Insufficient documentation

## 2019-03-25 NOTE — Assessment & Plan Note (Signed)
Likely patellar tendinitis given exam findings and history.  Unclear exactly what inciting event was.  Gave rehab stretches.  Given history of liver transplant will not do any NSAIDs.  Can take Tylenol as needed for pain.  Discussed that her limitation is 2 g daily.  Also discussed icing the area 15 minutes 3 times per day.  Can take topical liniments as needed.

## 2019-03-25 NOTE — Assessment & Plan Note (Signed)
Check CMP at patient request.  LFTs all look great normality.  Follow-up with transplant team as scheduled.

## 2019-03-25 NOTE — Assessment & Plan Note (Signed)
Likely secondary to extensor digitorum longus tendinitis.  I believe she subtley altered her gait to compensate for her left knee patellar tendinitis.  Gave foot and ankle rehabilitation exercises to do.  Can take Tylenol.  Given history of liver transplant no NSAIDs, 2 g daily limitation of Tylenol.  Recommend icing the area for 15 minutes 2 times per day.

## 2019-03-31 ENCOUNTER — Other Ambulatory Visit: Payer: Self-pay

## 2019-03-31 ENCOUNTER — Encounter: Payer: Self-pay | Admitting: Family Medicine

## 2019-03-31 ENCOUNTER — Ambulatory Visit: Payer: Medicaid Other | Admitting: Family Medicine

## 2019-03-31 VITALS — BP 104/72 | HR 98 | Wt 214.0 lb

## 2019-03-31 DIAGNOSIS — E039 Hypothyroidism, unspecified: Secondary | ICD-10-CM

## 2019-03-31 DIAGNOSIS — M25562 Pain in left knee: Secondary | ICD-10-CM

## 2019-03-31 DIAGNOSIS — Z0000 Encounter for general adult medical examination without abnormal findings: Secondary | ICD-10-CM | POA: Diagnosis not present

## 2019-03-31 DIAGNOSIS — E038 Other specified hypothyroidism: Secondary | ICD-10-CM | POA: Insufficient documentation

## 2019-03-31 NOTE — Assessment & Plan Note (Signed)
Symptomatically improving.  Started on low-dose Synthroid 25 mcg in 02/2019 due to significant TSH elevation, tolerating well.  Will recheck TSH today and make medication changes accordingly.  Encouraged continued balanced diet.

## 2019-03-31 NOTE — Patient Instructions (Signed)
I am so glad that you are doing wonderful today.  Please keep taking the Synthroid as is.  We will get a thyroid lab today and pending on that value will either stick at the Synthroid as is or go up on the dose.  I would like to see you back in 3 months for regular maintenance of your chronic conditions or sooner if needed.  I encourage you to continue staying physically active on a daily basis and being mindful of your diet making sure you are getting plenty of vegetables.

## 2019-03-31 NOTE — Assessment & Plan Note (Signed)
Last lipid panel in 2018, LDL 131 at that time.  Will recheck lipids today given elevated BMI.

## 2019-03-31 NOTE — Progress Notes (Signed)
   Subjective:    Patient ID: [name], female    DOB: 06-[name]-1976, 42 y.o.   MRN: [id number]   CC: Follow-up thyroid  HPI: [name] is a 43 yo female with [place] s/p liver transplant, seizure disorder, schizophrenia with developmental delay presenting to discuss the following:  Thyroid: Started on synthroid 25 mcg on 10/1 for subclinical hypothyroidism w/ TSH >10 and mildly symptomatic.  States she has been tolerating this medication very well, taking in the morning on empty stomach and waiting 1 hour to eat food/drink.  Reports no further cold intolerance and improved fatigue, bowel movements now regular.  Left ankle/foot pain: Seen on 10/23 by Dr. Kris Mouton for these pains, says they have improved and has not had any issue.  No further knee swelling.  Has been doing exercises.  Liver transplant/anemia/thrombocytopenia: Has been stable for quite some time.  Follows closely with her hepatologist at [facility].  States they called her this morning and will keep her medications as is.    Smoking status reviewed  Review of Systems Per HPI    Objective:  BP 104/72   Pulse 98   Wt 214 lb (97.1 kg)   SpO2 98%   BMI 36.73 kg/m  Vitals and nursing note reviewed  General: NAD, pleasant Cardiac: Regular rhythm, tachycardic  Respiratory: CTAB, normal effort, able to speak in full sentences Extremities: no edema or cyanosis.  Palpable dorsalis pedis and posterior tibialis pulses bilaterally Skin: warm and dry Neuro: alert and oriented, no focal deficits Psych: normal affect  Assessment & Plan:   Subclinical hypothyroidism Symptomatically improving.  Started on low-dose Synthroid 25 mcg in 02/2019 due to significant TSH elevation, tolerating well.  Will recheck TSH today and make medication changes accordingly.  Encouraged continued balanced diet.   Knee pain Resolved.  Encouraged continued rehab stretches and ice as needed.  Healthcare maintenance Last lipid panel in 2018, LDL 131 at  that time.  Will recheck lipids today given elevated BMI.    Follow-up in 3 months or sooner if needed pending medication changes.  [place] Medicine Resident PGY-2

## 2019-03-31 NOTE — Assessment & Plan Note (Signed)
Resolved.  Encouraged continued rehab stretches and ice as needed.

## 2019-04-01 LAB — LIPID PANEL
Chol/HDL Ratio: 4.3 ratio (ref 0.0–4.4)
Cholesterol, Total: 228 mg/dL — ABNORMAL HIGH (ref 100–199)
HDL: 53 mg/dL (ref >39)
LDL Chol Calc (NIH): 151 mg/dL — ABNORMAL HIGH (ref 0–99)
Triglycerides: 136 mg/dL (ref 0–149)
VLDL Cholesterol Cal: 24 mg/dL (ref 5–40)

## 2019-04-01 LAB — TSH: TSH: 3.76 u[IU]/mL (ref 0.450–4.500)

## 2019-04-06 ENCOUNTER — Telehealth: Payer: Self-pay | Admitting: Family Medicine

## 2019-04-06 NOTE — Telephone Encounter (Signed)
Patient calling about two things:  1. Patient states she brought a form from her dentist to her last appointment on 03/31/2019 and gave it to Dr. Higinio Plan. Patient states the doctor had to complete and sign it and attach medical records to it. I have checked in [place] box, med records request tray and filing cabinet and outgoing faxes filing cabinet. Cannot find this form anywhere.   2. Patient would like to know if she should keep taking her thyroid medication. If so, she will need refill as she only has 3 pills left.   Please call patient with these updates: [phone].

## 2019-04-07 ENCOUNTER — Ambulatory Visit (INDEPENDENT_AMBULATORY_CARE_PROVIDER_SITE_OTHER): Payer: Medicaid Other | Admitting: Allergy and Immunology

## 2019-04-07 ENCOUNTER — Encounter: Payer: Self-pay | Admitting: Allergy and Immunology

## 2019-04-07 ENCOUNTER — Other Ambulatory Visit: Payer: Self-pay | Admitting: Family Medicine

## 2019-04-07 ENCOUNTER — Other Ambulatory Visit: Payer: Self-pay

## 2019-04-07 VITALS — BP 118/72 | HR 92 | Temp 97.6°F | Resp 18

## 2019-04-07 DIAGNOSIS — J4540 Moderate persistent asthma, uncomplicated: Secondary | ICD-10-CM | POA: Diagnosis not present

## 2019-04-07 DIAGNOSIS — E038 Other specified hypothyroidism: Secondary | ICD-10-CM

## 2019-04-07 DIAGNOSIS — J302 Other seasonal allergic rhinitis: Secondary | ICD-10-CM

## 2019-04-07 DIAGNOSIS — L308 Other specified dermatitis: Secondary | ICD-10-CM

## 2019-04-07 DIAGNOSIS — K219 Gastro-esophageal reflux disease without esophagitis: Secondary | ICD-10-CM | POA: Diagnosis not present

## 2019-04-07 DIAGNOSIS — E039 Hypothyroidism, unspecified: Secondary | ICD-10-CM

## 2019-04-07 DIAGNOSIS — J3089 Other allergic rhinitis: Secondary | ICD-10-CM | POA: Diagnosis not present

## 2019-04-07 DIAGNOSIS — L989 Disorder of the skin and subcutaneous tissue, unspecified: Secondary | ICD-10-CM

## 2019-04-07 MED ORDER — LEVOTHYROXINE SODIUM 25 MCG PO TABS: 25.0000 ug | ORAL_TABLET | Freq: Every day | ORAL | 3 refills | Status: DC

## 2019-04-07 MED ORDER — SPACER/AERO-HOLD CHAMBER MASK MISC: 1 refills | Status: DC

## 2019-04-07 MED ORDER — BUDESONIDE-FORMOTEROL FUMARATE 160-4.5 MCG/ACT IN AERO: 2.0000 | INHALATION_SPRAY | Freq: Two times a day (BID) | RESPIRATORY_TRACT | 5 refills | Status: DC

## 2019-04-07 MED ORDER — FLUTICASONE PROPIONATE 50 MCG/ACT NA SUSP: 1.0000 | Freq: Two times a day (BID) | NASAL | 5 refills | Status: DC

## 2019-04-07 MED ORDER — AZELASTINE HCL 0.15 % NA SOLN: 1.0000 | Freq: Two times a day (BID) | NASAL | 5 refills | Status: DC

## 2019-04-07 NOTE — Telephone Encounter (Signed)
Called patient. Form placed with medical records, should be faxed today or tomorrow. Would like her to continue synthroid, refills ordered.   [name], DO

## 2019-04-07 NOTE — Progress Notes (Signed)
[place]   Follow-up Note  Referring Provider: [name], DO Primary Provider: [name], DO Date of Office Visit: 04/07/2019  Subjective:   [name] (DOB: January 10, 1976) is a 43 y.o. female who returns to the [facility] on 04/07/2019 in re-evaluation of the following:  HPI: [name] returns to this clinic in reevaluation of asthma and allergic rhinoconjunctivitis and eczema.  Her last visit to this clinic was 06 January 2019.  She has done very well with her asthma.  Her requirement for short acting bronchodilator is less than 1 time per week while she continues on Symbicort and montelukast consistently.  She has not required a systemic steroid to treat an exacerbation.  She is having some difficulty placing her mouth around the Symbicort inhaler because of her recent Bell's palsy and recently started to replace this agent with nebulized budesonide.  She does not think that she is getting good closure around her inhaler.  Her nose is doing very well while using a nasal antihistamine and nasal steroid.  She has not required a antibiotic to treat an episode of sinusitis.  Her reflux is doing better at this point.  She has eliminated caffeine consumption and she had her omeprazole changed to pantoprazole by her gastroenterologist.  She is no longer using famotidine in the evening.  Her eczema is under very good control with the use of topical mometasone about once every 2 weeks.  Usually this is applied to her hands.  She did obtain the flu vaccine this year.  Allergies as of 04/07/2019      Reactions   Tramadol Hcl Itching, Nausea And Vomiting   Causes SEIZURES   Aspirin Nausea And Vomiting   Due to Stomach ulcer   Ibuprofen Other (See Comments)   Due to stomach ulcer   Penicillins Itching, Swelling, Rash   Breakout  Has patient had a PCN reaction causing immediate rash, facial/tongue/throat swelling,  SOB or lightheadedness with hypotension: Yes Has patient had a PCN reaction causing severe rash involving mucus membranes or skin necrosis: Yes Has patient had a PCN reaction that required hospitalization: Yes Has patient had a PCN reaction occurring within the last 10 years: No If all of the above answers are "NO", then may proceed with Cephalosporin use.     Pseudoephedrine Itching, Swelling   Tongue swelling   Sulfa Antibiotics Nausea And Vomiting   Latex Itching, Rash      Medication List      Accu-Chek Guide w/Device Kit 1 each by Does not apply route daily. Use to check blood sugar once daily or as directed.   Accu-Chek Softclix Lancets lancets Use to check blood sugar once daily in the morning prior to eating.   azelastine 0.1 % nasal spray Commonly known as: Astelin Place 2 sprays into both nostrils 2 (two) times daily. Use in each nostril as directed   Azelastine HCl 0.15 % Soln Place 1 spray into the nose 2 (two) times daily.   benzonatate 100 MG capsule Commonly known as: TESSALON Take 1 capsule (100 mg total) by mouth 3 (three) times daily as needed for cough.   benztropine 0.5 MG tablet Commonly known as: COGENTIN Take 0.5 mg by mouth at bedtime.   budesonide 0.5 MG/2ML nebulizer solution Commonly known as: PULMICORT Take 2 mLs (0.5 mg total) by nebulization 2 (two) times a day.   budesonide-formoterol 160-4.5 MCG/ACT inhaler Commonly known as: SYMBICORT  Inhale 2 puffs into the lungs 2 (two) times daily. Use with spacer and mask.   carbamide peroxide 6.5 % OTIC solution Commonly known as: DEBROX Place 5 drops into the left ear 2 (two) times daily.   cetirizine 10 MG tablet Commonly known as: ZYRTEC Take 1 tablet (10 mg total) by mouth daily.   cycloSPORINE modified 100 MG Caps Take 100-125 mg by mouth 2 (two) times daily. Take 100 mg in the AM and 125 mg in the PM   cycloSPORINE modified 25 MG capsule Commonly known as: NEORAL Use in combination  with 100 mg capsules for PM dose of 125 mg.   diclofenac sodium 1 % Gel Commonly known as: VOLTAREN Apply 4 g topically 4 (four) times daily.   divalproex 500 MG 24 hr tablet Commonly known as: DEPAKOTE ER Take 2 tablets (1,000 mg total) by mouth at bedtime.   doxepin 10 MG capsule Commonly known as: SINEQUAN Take 25 mg by mouth at bedtime.   doxepin 25 MG capsule Commonly known as: SINEQUAN TK 1 C PO HS   famotidine 40 MG tablet Commonly known as: PEPCID Take 1 tablet (40 mg total) by mouth every evening.   FIBER (GUAR GUM) PO Take by mouth.   fluticasone 50 MCG/ACT nasal spray Commonly known as: FLONASE Place 1 spray into both nostrils 2 (two) times daily. What changed:   how much to take  when to take this Changed by: Charrie Mcconnon Kevan Rosebush, MD   glucose blood test strip Commonly known as: Accu-Chek Guide Use to check blood sugar once daily or as directed.   hydrocortisone valerate cream 0.2 % Commonly known as: WESTCORT APPLY 1 APPLICATION TOPICALLY TO AFFECTED AREAS ON HANDS AND FEET BID PRN FOR DERMATITIS   hydroxypropyl methylcellulose / hypromellose 2.5 % ophthalmic solution Commonly known as: ISOPTO TEARS / GONIOVISC Place 1 drop into the left eye 4 (four) times daily as needed for dry eyes.   Lac-Hydrin 12 % cream Generic drug: ammonium lactate Apply topically 2 (two) times daily.   levothyroxine 25 MCG tablet Commonly known as: Synthroid Take 1 tablet (25 mcg total) by mouth daily before breakfast.   magnesium oxide 400 MG tablet Commonly known as: MAG-OX Take 400 mg by mouth 2 (two) times daily. 5pm & 7pm.   mometasone 0.1 % cream Commonly known as: Elocon Apply 1 application topically 2 (two) times daily.   montelukast 10 MG tablet Commonly known as: SINGULAIR Take 1 tablet (10 mg total) by mouth at bedtime.   multivitamin tablet Take 1 tablet by mouth daily. 9 am.   mycophenolate 250 MG capsule Commonly known as: CELLCEPT Take 500 mg by  mouth 2 (two) times daily. 9 am & 9 pm.   Nebulizer/Tubing/Mouthpiece Kit 2 each by Does not apply route as directed.   Nebulizers Misc 1 kit by Does not apply route as directed.   omeprazole 40 MG capsule Commonly known as: PRILOSEC Take 1 capsule (40 mg total) by mouth 2 (two) times daily.   Perforomist 20 MCG/2ML nebulizer solution Generic drug: formoterol Take 2 mLs (20 mcg total) by nebulization 2 (two) times daily.   polyethylene glycol 17 g packet Commonly known as: MIRALAX / GLYCOLAX Take 17 g by mouth daily as needed (for constipation). In water or juice   prazosin 1 MG capsule Commonly known as: MINIPRESS Take 2 mg by mouth at bedtime.   ProAir HFA 108 (90 Base) MCG/ACT inhaler Generic drug: albuterol Inhale 1 puff into the lungs  every 4 (four) hours as needed for shortness of breath.   albuterol (2.5 MG/3ML) 0.083% nebulizer solution Commonly known as: PROVENTIL 1 vial via nebulizer every 4-6 hours as needed.   RA Vitamin D-3 50 MCG (2000 UT) Caps Generic drug: Cholecalciferol Take 1 capsule by mouth daily.   Refresh Tears 0.5 % Soln Generic drug: carboxymethylcellulose Place 1 drop into both eyes 4 (four) times daily as needed (for dry eyes).   risperiDONE 3 MG tablet Commonly known as: RISPERDAL Take 3 mg by mouth at bedtime.   Spacer/Aero-Hold [employer] Use as directed with inhaler. Started by: Jiles Prows, MD   triamcinolone cream 0.1 % Commonly known as: KENALOG Apply 1 application topically once a week.       Past Medical History:  Diagnosis Date   Abnormal vaginal bleeding    Allergy    Anemia    Arthritis    right knee   Asthma 09-23-12   Asthma somewhat causing increase wheezing and mild congestion at present   Biliary cirrhosis ([place]) 04/26/11   Constipation    DUB (dysfunctional uterine bleeding)    Fall at home, initial encounter 01/21/2019   GERD (gastroesophageal reflux disease)    Health care maintenance  03/06/2014   Intellectual disability    Left-sided Bell's palsy 12/22/2018   Liver transplant recipient [facility])    Lumbar back pain 04/06/2014   PONV (postoperative nausea and vomiting)    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 seizures   Vitamin D deficiency     Past Surgical History:  Procedure Laterality Date   adenonoidectomy     APPENDECTOMY     CHOLECYSTECTOMY     COLONOSCOPY W/ BIOPSIES     ESOPHAGOGASTRODUODENOSCOPY (EGD) WITH PROPOFOL N/A 10/01/2012   Procedure: ESOPHAGOGASTRODUODENOSCOPY (EGD) WITH PROPOFOL;  Surgeon: Arta Silence, MD;  Location: [facility];  Service: Endoscopy;  Laterality: N/A;   eustacean tubes     EYE MUSCLE SURGERY     LIVER TRANSPLANT      Review of systems negative except as noted in HPI / PMHx or noted below:  Review of Systems  Constitutional: Negative.   HENT: Negative.   Eyes: Negative.   Respiratory: Negative.   Cardiovascular: Negative.   Gastrointestinal: Negative.   Genitourinary: Negative.   Musculoskeletal: Negative.   Skin: Negative.   Neurological: Negative.   Endo/Heme/Allergies: Negative.   Psychiatric/Behavioral: Negative.      Objective:   Vitals:   04/07/19 1021  BP: 118/72  Pulse: 92  Resp: 18  Temp: 97.6 F (36.4 C)  SpO2: 99%          Physical Exam Constitutional:      Appearance: She is not diaphoretic.  HENT:     Head: Normocephalic.     Right Ear: Tympanic membrane, ear canal and external ear normal.     Left Ear: Tympanic membrane, ear canal and external ear normal.     Nose: Nose normal. No mucosal edema or rhinorrhea.     Mouth/Throat:     Pharynx: Uvula midline. No oropharyngeal exudate.  Eyes:     Conjunctiva/sclera: Conjunctivae normal.  Neck:     Thyroid: No thyromegaly.     Trachea: Trachea normal. No tracheal tenderness or tracheal deviation.  Cardiovascular:     Rate and Rhythm: Normal rate and regular rhythm.      Heart sounds: Normal heart sounds, S1 normal and S2 normal. No murmur.  Pulmonary:  Effort: No respiratory distress.     Breath sounds: Normal breath sounds. No stridor. No wheezing or rales.  Lymphadenopathy:     Head:     Right side of head: No tonsillar adenopathy.     Left side of head: No tonsillar adenopathy.     Cervical: No cervical adenopathy.  Skin:    Findings: Rash (Scale wrist bilaterally) present. No erythema.     Nails: There is no clubbing.   Neurological:     Mental Status: She is alert.     Diagnostics:    Spirometry was performed and demonstrated an FEV1 of 1.64 at 66 % of predicted.  The patient had an Asthma Control Test with the following results: ACT Total Score: 24.    Assessment and Plan:   1. Asthma, moderate persistent, well-controlled   2. Other allergic rhinitis   3. Inflammatory dermatosis   4. LPRD (laryngopharyngeal reflux disease)   5. Seasonal allergies     1.  Continue Symbicort 160 2 inhalations twice a day with spacer / mask  2. Continue montelukast 10 mg one tablet once a day  3. Continue nasal Azelastine and fluticasone 1 spray each nostril twice a day  4. Continue cetirizine 10 mg one tablet once a day  5. Continue ProAir HFA 2 puffs every 4-6 hours if needed  6. Continue Mometasone 0.1% ointment 1-2 times a day if needed   7. Treat reflux:   A.  Use decaf coffee in the smoothie  B.  Pantoprazole 40 mg 2 times per day   8. Return to clinic in 3 months or earlier if problem  9. Obtain COVID vaccine when available   [name] appears to be doing relatively well at this point in time on a combination of anti-inflammatory agents for her airway inflammatory condition and therapy directed against reflux.  She will continue on this plan and I will see her back in this clinic in 3 months or earlier if there is a problem.  Allena Katz, MD Allergy / Immunology [place]

## 2019-04-07 NOTE — Patient Instructions (Addendum)
  1.  Continue Symbicort 160 2 inhalations twice a day with spacer / mask  2. Continue montelukast 10 mg one tablet once a day  3. Continue nasal Azelastine and fluticasone 1 spray each nostril twice a day  4. Continue cetirizine 10 mg one tablet once a day  5. Continue ProAir HFA 2 puffs every 4-6 hours if needed  6. Continue Mometasone 0.1% ointment 1-2 times a day if needed   7. Treat reflux:   A.  Use decaf coffee in the smoothie  B.  Pantoprazole 40 mg 2 times per day   8. Return to clinic in 3 months or earlier if problem  9. Obtain  COVID vaccine when available

## 2019-04-08 ENCOUNTER — Encounter: Payer: Self-pay | Admitting: Allergy and Immunology

## 2019-04-10 ENCOUNTER — Telehealth: Payer: Self-pay

## 2019-04-10 NOTE — Telephone Encounter (Signed)
Spoke to pharmacy and pharmacy states the spacer isn't covered and will cost $59 with a discount card. Pharmacy states the spacer will require a PA? [name] is attempting to send in a written order. If this doesn't work, will Physiological scientist with [employer] and have her bring [place] more spacers with large mask as we are out at the moment.

## 2019-04-10 NOTE — Telephone Encounter (Signed)
Written PA has been submitted to [facility] along with most recent office note stating that the patient has been having issues using her inhalers due to her Bell's Palsy. Paperwork has been placed in the pending tray in the nurse's station.

## 2019-04-10 NOTE — Telephone Encounter (Signed)
Patient called stating [facility] will not cover her spacer with the mask.   Please Advise  [facility] [place]

## 2019-04-14 NOTE — Telephone Encounter (Signed)
Aerochamber with large mask isn't coved at the pharmacy  Rx and going to discuss further options with what we have in house!

## 2019-04-16 NOTE — Telephone Encounter (Signed)
lvm for patient to come pick up and to call back with questions.

## 2019-04-16 NOTE — Telephone Encounter (Signed)
Patient called back and stated she will not be able to pick up device this week nor next week and will attempt to pick up the Aerochamber the following week

## 2019-04-28 ENCOUNTER — Encounter: Payer: Self-pay | Admitting: Family Medicine

## 2019-05-04 ENCOUNTER — Telehealth (INDEPENDENT_AMBULATORY_CARE_PROVIDER_SITE_OTHER): Payer: Medicaid Other | Admitting: Family Medicine

## 2019-05-04 ENCOUNTER — Other Ambulatory Visit: Payer: Self-pay

## 2019-05-04 DIAGNOSIS — Z20822 Contact with and (suspected) exposure to covid-19: Secondary | ICD-10-CM

## 2019-05-04 DIAGNOSIS — Z20828 Contact with and (suspected) exposure to other viral communicable diseases: Secondary | ICD-10-CM

## 2019-05-04 NOTE — Progress Notes (Signed)
Virtual Visit via Telephone Note  I connected with [name] on 05/04/19 at  3:15 PM EST by telephone and verified that I am speaking with the correct person using two identifiers.  Location: Patient: [name] Provider: Carollee Leitz, MD   I discussed the limitations, risks, security and privacy concerns of performing an evaluation and management service by telephone and the availability of in person appointments. I also discussed with the patient that there may be a patient responsible charge related to this service. The patient expressed understanding and agreed to proceed.   History of Present Illness: Patient concerned that she was in contact with her cousin who has a niece that tested positive for Covid.  Patient reports that neither she nor the cousin have symptoms of Covid.  Denies fever, shortness of breath, body aches.  Patient has asthma and reports cough on and off due to asthma flare.  She reports that her and her cousin are going to be Covid tested tomorrow.   Observations/Objective: No SOB, patient able to talk in full sentences  Assessment and Plan: Covid testing Patient asymptomatic.  No cough, no fever.  Await Covid test results  Educated regarding the importance of using mask and handwashing.  Also educated regarding self quarantine/isolation. Strict return to care instructions given.  Follow Up Instructions: Follow-up with PCP as needed Strict return to care instructions given.  I discussed the assessment and treatment plan with the patient. The patient was provided an opportunity to ask questions and all were answered. The patient agreed with the plan and demonstrated an understanding of the instructions.   The patient was advised to call back or seek an in-person evaluation if the symptoms worsen or if the condition fails to improve as anticipated.  I provided 15 minutes of non-face-to-face time during this encounter.   Carollee Leitz, MD

## 2019-05-11 ENCOUNTER — Telehealth: Payer: Self-pay | Admitting: Family Medicine

## 2019-05-11 NOTE — Telephone Encounter (Signed)
Patient received a negative test result from [facility] for her Covid test, just fyi, thanks.

## 2019-06-10 ENCOUNTER — Other Ambulatory Visit: Payer: Self-pay | Admitting: Allergy and Immunology

## 2019-06-23 ENCOUNTER — Other Ambulatory Visit: Payer: Self-pay

## 2019-06-23 ENCOUNTER — Ambulatory Visit (INDEPENDENT_AMBULATORY_CARE_PROVIDER_SITE_OTHER): Payer: Medicaid Other | Admitting: Family Medicine

## 2019-06-23 ENCOUNTER — Encounter: Payer: Self-pay | Admitting: Family Medicine

## 2019-06-23 VITALS — BP 130/82 | HR 79 | Wt 213.6 lb

## 2019-06-23 DIAGNOSIS — Z6836 Body mass index (BMI) 36.0-36.9, adult: Secondary | ICD-10-CM | POA: Diagnosis not present

## 2019-06-23 DIAGNOSIS — E038 Other specified hypothyroidism: Secondary | ICD-10-CM

## 2019-06-23 DIAGNOSIS — K743 Primary biliary cirrhosis: Secondary | ICD-10-CM | POA: Diagnosis not present

## 2019-06-23 DIAGNOSIS — E039 Hypothyroidism, unspecified: Secondary | ICD-10-CM

## 2019-06-23 NOTE — Progress Notes (Addendum)
   Subjective:    Patient ID: [name], female    DOB: 1975/12/03, 44 y.o.   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  She continues to tolerate this well.  TSH 3.76 in 03/2019.  She denies any weight gain, fatigue, weakness, dry skin, joint pains.  Otherwise, states she is doing well.  Following up with her [name] hepatology team next month and will be getting lab work at that time.  She is currently trying to lose weight as she has noticed an increase over the last year.  She has started doing Zumba almost on a daily basis and having salads/smoothies.  This has been going very well for her and she is very excited.  Up-to-date on her health maintenance screening.   Smoking status reviewed  Review of Systems Per HPI   Objective:  BP 130/82   Pulse 79   Wt 213 lb 9.6 oz (96.9 kg)   SpO2 98%   BMI 36.66 kg/m  Vitals and nursing note reviewed  General: NAD, pleasant HEENT: Thyroid not enlarged without palpation of nodules Cardiac: RRR Respiratory: CTAB, normal effort Abdomen: soft, nontender, nondistended Extremities: no edema or cyanosis. WWP. Skin: warm and dry, no rashes noted  Assessment & Plan:   Subclinical hypothyroidism Tolerating low-dose Synthroid 25 mcg.  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  Pending antibody results, may trial off of Synthroid in the next coming months if negative.   Primary biliary cirrhosis (HCC) S/p liver transplant.  Follows with [name], due to see them next month.  BMI 36.0-36.9,adult Congratulated her on her efforts towards a well-balanced diet and physical activity.  Encouraged  her to continue working towards weight loss.   Follow-up in approximately 3-6 months or sooner if needed.  [facility] Resident PGY-2

## 2019-06-23 NOTE — Assessment & Plan Note (Signed)
Congratulated her on her efforts towards a well-balanced diet and physical activity.  Encouraged her to continue working towards weight loss.

## 2019-06-23 NOTE — Patient Instructions (Signed)
It was so wonderful to see you.  Keep up the great work with trying to stay physically active and working on your diet!  We are checking your thyroid level today and some antibody levels, I will let you know these results within the next week.  Otherwise I would like you to follow-up in the next 3-6 months or sooner if needed.

## 2019-06-23 NOTE — Assessment & Plan Note (Signed)
Tolerating low-dose Synthroid 25 mcg.  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  Pending antibody results, may trial off of Synthroid in the next coming months if negative.

## 2019-06-23 NOTE — Assessment & Plan Note (Signed)
S/p liver transplant.  Follows with [name], due to see them next month.

## 2019-06-24 LAB — THYROID ANTIBODIES
Thyroglobulin Antibody: <1 IU/mL (ref 0.0–0.9)
Thyroperoxidase Ab SerPl-aCnc: 12 IU/mL (ref 0–34)

## 2019-06-24 LAB — TSH: TSH: 4.54 u[IU]/mL — ABNORMAL HIGH (ref 0.450–4.500)

## 2019-06-29 ENCOUNTER — Telehealth: Payer: Self-pay

## 2019-06-29 NOTE — Telephone Encounter (Signed)
Pt returned phone call to nurse line and states that she will need another rx of synthroid sent in due to increase in dose. Pt states that she has 11 pills left of the 25 mcg dosage. Patient states that she does not have enough to go up to 50 mcg.   To PCP  Please advise  Talbot Grumbling, RN

## 2019-06-30 ENCOUNTER — Other Ambulatory Visit: Payer: Self-pay | Admitting: Family Medicine

## 2019-06-30 DIAGNOSIS — E039 Hypothyroidism, unspecified: Secondary | ICD-10-CM

## 2019-06-30 DIAGNOSIS — E038 Other specified hypothyroidism: Secondary | ICD-10-CM

## 2019-06-30 MED ORDER — LEVOTHYROXINE SODIUM 50 MCG PO TABS: 50.0000 ug | ORAL_TABLET | ORAL | 0 refills | Status: DC

## 2019-06-30 NOTE — Telephone Encounter (Signed)
Pt is calling because her synthroid medication will not last her. The dosage was increased. She has someone going to the pharmacy today. Please call today. jw

## 2019-06-30 NOTE — Telephone Encounter (Signed)
New dose of synthroid sent in.   [name], DO

## 2019-06-30 NOTE — Telephone Encounter (Signed)
Patient also needs refill on her test strips sent to [facility]. Please advise.

## 2019-07-08 ENCOUNTER — Other Ambulatory Visit: Payer: Self-pay | Admitting: Allergy and Immunology

## 2019-07-08 ENCOUNTER — Telehealth: Payer: Self-pay

## 2019-07-08 NOTE — Telephone Encounter (Signed)
Patient is scheduled with Dr. Ernst Bowler at 11 am televisit.

## 2019-07-08 NOTE — Telephone Encounter (Signed)
Saundra from [place] at home call requesting update of patients medication from Dr. Neldon Mc. She was advised from last OV to continue current regimen which included Symbicort 2 puff BID, Cetrizine daily, Azelastine and Fluticasone 1spray bid, ProAir HA 2 puff q4-6H, Mometasone 0.1% 1-2 x qd PRN and Protonix 40 bid. [facility] stated they have orders for Perforomist which is not mentioned to continue. Is patient taking Perforomist? Also they stated she is coughing up green/brownish sputum since this week and wanted to know if they need to do something else other then what is being told to continue. Please advise on this.

## 2019-07-08 NOTE — Telephone Encounter (Signed)
Please have patient be seen in [place] clinic today, tomorrow.

## 2019-07-09 ENCOUNTER — Ambulatory Visit (INDEPENDENT_AMBULATORY_CARE_PROVIDER_SITE_OTHER): Payer: Medicaid Other | Admitting: Allergy & Immunology

## 2019-07-09 ENCOUNTER — Other Ambulatory Visit: Payer: Self-pay

## 2019-07-09 ENCOUNTER — Encounter: Payer: Self-pay | Admitting: Allergy & Immunology

## 2019-07-09 DIAGNOSIS — J302 Other seasonal allergic rhinitis: Secondary | ICD-10-CM

## 2019-07-09 DIAGNOSIS — L989 Disorder of the skin and subcutaneous tissue, unspecified: Secondary | ICD-10-CM

## 2019-07-09 DIAGNOSIS — J0100 Acute maxillary sinusitis, unspecified: Secondary | ICD-10-CM | POA: Diagnosis not present

## 2019-07-09 DIAGNOSIS — J4541 Moderate persistent asthma with (acute) exacerbation: Secondary | ICD-10-CM

## 2019-07-09 DIAGNOSIS — L308 Other specified dermatitis: Secondary | ICD-10-CM | POA: Diagnosis not present

## 2019-07-09 MED ORDER — DOXYCYCLINE HYCLATE 100 MG PO TBEC: 100.0000 mg | DELAYED_RELEASE_TABLET | Freq: Two times a day (BID) | ORAL | 0 refills | Status: DC

## 2019-07-09 MED ORDER — PREDNISONE 10 MG PO TABS: ORAL_TABLET | ORAL | 0 refills | Status: DC

## 2019-07-09 MED ORDER — DOXYCYCLINE MONOHYDRATE 100 MG PO CAPS: 100.0000 mg | ORAL_CAPSULE | Freq: Two times a day (BID) | ORAL | 0 refills | Status: DC

## 2019-07-09 MED ORDER — FLUTICASONE PROPIONATE 50 MCG/ACT NA SUSP: 1.0000 | Freq: Two times a day (BID) | NASAL | 5 refills | Status: DC

## 2019-07-09 MED ORDER — AZELASTINE HCL 0.1 % NA SOLN: 2.0000 | Freq: Two times a day (BID) | NASAL | 5 refills | Status: DC

## 2019-07-09 MED ORDER — AZELASTINE HCL 0.15 % NA SOLN: 1.0000 | Freq: Two times a day (BID) | NASAL | 5 refills | Status: DC

## 2019-07-09 NOTE — Addendum Note (Signed)
Addended by: Neomia Dear on: 07/09/2019 02:25 PM   Modules accepted: Orders

## 2019-07-09 NOTE — Progress Notes (Signed)
RE: [name] MRN: [id number] DOB: 03/25/1976 Date of Telemedicine Visit: 07/09/2019  Referring provider: [name], DO Primary care provider: [name], DO  Chief Complaint: Cough (coughing up green and brown mucous)   Telemedicine Follow Up Visit via Telephone: I connected with [name] for a follow up on 07/09/19 by telephone and verified that I am speaking with the correct person using two identifiers.   I discussed the limitations, risks, security and privacy concerns of performing an evaluation and management service by telephone and the availability of in person appointments. I also discussed with the patient that there may be a patient responsible charge related to this service. The patient expressed understanding and agreed to proceed.  Patient is at home.  Provider is at the office.  Visit start time: 11:00 AM Visit end time: 11:31 AM Insurance consent/check in by: [facility] Medical consent and medical assistant/nurse: Lisabeth Pick  History of Present Illness:  She is a 44 y.o. female, who is being followed for persistent asthma as well as eczema and rhinitis and reflux. Her previous allergy office visit was in November 2020 with Dr. Neldon Mc.  At that visit, her asthma seems well controlled.  He continued Symbicort 160/4.5 mcg 2 puffs twice daily.  He also continued the montelukast and ProAir.  For her rhinitis she was continued on Astelin and Flonase as well as cetirizine.  Her mometasone ointment for her skin was refilled.  Her reflux was controlled with pantoprazole 40 mg twice daily as well as decaf coffee.  Since last visit, she has mostly done well.  However, over the last week, he has developed coughing productive of brown mucus. She has had this since Monday. She has had no fever. She has an awful headache since Sunday and she took a Tylenol without improvement. She is allergic to Mucinex. She is using the fluticasone and azelastine. This is the first time in  2021 that she has had these sym[ptoms. She was not sick in 2020 at all but she was in 2018 or 2019. The last time that she got any antibiotics was 2019.  She is unsure when she last got prednisone.  Asthma/Respiratory Symptom History: She remains on the Symbicort two puffs twice daily. She is needing her rescue inhaler around three times daily. She reports that she has problems when she lays down at night.  She is on reflux medication, including pantoprazole twice daily. She has been wheezing more as of late with this ongoing infection, but otherwise her symptoms have been under good control. She cannot remember the last time that she got prednisone.    Allergic Rhinitis Symptom History: She remains on fluticasone and azelastine daily.  She is using her cetirizine daily as well.  She has not used any additional medications during this week of sinus issues, as she does react to medication such as Mucinex.  She has used nasal saline rinse.  Eczema Symptom Symptom History: Eczema is under good control with the mometasone as needed.  She does not have any current flares.   She does have a history of subclinical hypothyroidism. 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  Otherwise, there have been no changes to her past medical history, surgical history, family history, or social history.  Assessment and Plan:  Geanine is a 44 y.o. female with:  Moderate persistent asthma with acute exacerbation  Seasonal allergies  Inflammatory dermatosis  Acute non-recurrent maxillary sinusitis  Complicated past medical history, including liver transplant as well as seizure disorder   We are going to go ahead and start a course of doxycycline and prednisone for her sinusitis.  She has not been on either of these in quite some time.  We did refill her asthma medications and her allergy medications.  She is not feeling better by  next week, I did ask her to give [name] a call back and we can make some changes over the phone.  He is in agreement with this plan and will keep [name] up-to-date    Diagnostics: None.  Medication List:  Current Outpatient Medications  Medication Sig Dispense Refill  . ACCU-CHEK GUIDE test strip USE TO CHECK BLOOD SUGAR EVERY DAY 50 strip 10  . ACCU-CHEK SOFTCLIX LANCETS lancets Use to check blood sugar once daily in the morning prior to eating. 100 each 2  . albuterol (PROVENTIL) (2.5 MG/3ML) 0.083% nebulizer solution 1 vial via nebulizer every 4-6 hours as needed. 75 mL 1  . ammonium lactate (LAC-HYDRIN) 12 % cream Apply topically 2 (two) times daily.     . Azelastine HCl 0.15 % SOLN Place 1 spray into the nose 2 (two) times daily. 30 mL 5  . benzonatate (TESSALON) 100 MG capsule Take 1 capsule (100 mg total) by mouth 3 (three) times daily as needed for cough. 14 capsule 0  . benztropine (COGENTIN) 0.5 MG tablet Take 0.5 mg by mouth at bedtime.    . Blood Glucose Monitoring Suppl (ACCU-CHEK GUIDE) w/Device KIT 1 each by Does not apply route daily. Use to check blood sugar once daily or as directed. 1 kit 0  . budesonide (PULMICORT) 0.5 MG/2ML nebulizer solution Take 2 mLs (0.5 mg total) by nebulization 2 (two) times a day. 60 mL 5  . budesonide-formoterol (SYMBICORT) 160-4.5 MCG/ACT inhaler Inhale 2 puffs into the lungs 2 (two) times daily. Use with spacer and mask. 1 Inhaler 5  . carbamide peroxide (DEBROX) 6.5 % OTIC solution Place 5 drops into the left ear 2 (two) times daily. 15 mL 0  . carboxymethylcellulose (REFRESH TEARS) 0.5 % SOLN Place 1 drop into both eyes 4 (four) times daily as needed (for dry eyes).    . cetirizine (ZYRTEC) 10 MG tablet TAKE 1 TABLET(10 MG) BY MOUTH DAILY 30 tablet 1  . cycloSPORINE modified (NEORAL) 25 MG capsule Use in combination with 100 mg capsules for PM dose of 125 mg.  1  . cycloSPORINE modified 100 MG CAPS Take 100-125 mg by mouth 2 (two) times daily. Take  100 mg in the AM and 125 mg in the PM    . diclofenac sodium (VOLTAREN) 1 % GEL Apply 4 g topically 4 (four) times daily. (Patient not taking: Reported on 04/07/2019) 100 g 0  . divalproex (DEPAKOTE ER) 500 MG 24 hr tablet Take 2 tablets (1,000 mg total) by mouth at bedtime. 180 tablet 3  . doxepin (SINEQUAN) 10 MG capsule Take 25 mg by mouth at bedtime.     Marland Kitchen doxepin (SINEQUAN) 25 MG capsule TK 1 C PO HS    . doxycycline (DORYX) 100 MG EC tablet Take 1 tablet (100 mg total) by mouth 2 (two) times daily for 7 days. 14 tablet 0  . famotidine (PEPCID) 40 MG tablet Take 1 tablet (40 mg total) by mouth every evening. (Patient not taking: Reported on 04/07/2019) 30 tablet 5  . FIBER, GUAR GUM, PO Take by mouth.    Marland Kitchen  fluticasone (FLONASE) 50 MCG/ACT nasal spray Place 1 spray into both nostrils 2 (two) times daily. 16 g 5  . hydrocortisone valerate cream (WESTCORT) 0.2 % APPLY 1 APPLICATION TOPICALLY TO AFFECTED AREAS ON HANDS AND FEET BID PRN FOR DERMATITIS  2  . hydroxypropyl methylcellulose / hypromellose (ISOPTO TEARS / GONIOVISC) 2.5 % ophthalmic solution Place 1 drop into the left eye 4 (four) times daily as needed for dry eyes. (Patient not taking: Reported on 04/07/2019) 15 mL 12  . levothyroxine (SYNTHROID) 25 MCG tablet Take 1 tablet (25 mcg total) by mouth daily before breakfast. 30 tablet 3  . levothyroxine (SYNTHROID) 50 MCG tablet Take 1 tablet (50 mcg total) by mouth every morning. 30 minutes before food 30 tablet 0  . magnesium oxide (MAG-OX) 400 MG tablet Take 400 mg by mouth 2 (two) times daily. 5pm & 7pm.    . mometasone (ELOCON) 0.1 % cream Apply 1 application topically 2 (two) times daily. (Patient not taking: Reported on 04/07/2019) 45 g 5  . montelukast (SINGULAIR) 10 MG tablet TAKE 1 TABLET BY MOUTH AT BEDTIME 90 tablet 1  . Multiple Vitamin (MULTIVITAMIN) tablet Take 1 tablet by mouth daily. 9 am.    . mycophenolate (CELLCEPT) 250 MG capsule Take 500 mg by mouth 2 (two) times  daily. 9 am & 9 pm.    . Nebulizers MISC 1 kit by Does not apply route as directed. 1 kit 1  . omeprazole (PRILOSEC) 40 MG capsule Take 1 capsule (40 mg total) by mouth 2 (two) times daily. (Patient not taking: Reported on 04/07/2019) 60 capsule 5  . PERFOROMIST 20 MCG/2ML nebulizer solution Take 2 mLs (20 mcg total) by nebulization 2 (two) times daily. (Patient not taking: Reported on 04/07/2019) 120 mL 5  . polyethylene glycol (MIRALAX / GLYCOLAX) packet Take 17 g by mouth daily as needed (for constipation). In water or juice    . prazosin (MINIPRESS) 1 MG capsule Take 2 mg by mouth at bedtime.     . predniSONE (DELTASONE) 10 MG tablet Take two tablets (44m) twice daily for three days, then one tablet (166m twice daily for three days, then STOP 18 tablet 0  . PROAIR HFA 108 (90 Base) MCG/ACT inhaler Inhale 1 puff into the lungs every 4 (four) hours as needed for shortness of breath. 1 Inhaler 1  . RA VITAMIN D-3 2000 units CAPS Take 1 capsule by mouth daily.   0  . Respiratory Therapy Supplies (NEBULIZER/TUBING/MOUTHPIECE) KIT 2 each by Does not apply route as directed. 2 kit 5  . risperiDONE (RISPERDAL) 3 MG tablet Take 3 mg by mouth at bedtime.    . Marland Kitchenpacer/Aero-Hold Chamber Mask MISC Use as directed with inhaler. 1 each 1  . triamcinolone cream (KENALOG) 0.1 % Apply 1 application topically once a week.  2   No current facility-administered medications for this visit.   Allergies: Allergies  Allergen Reactions  . Tramadol Hcl Itching and Nausea And Vomiting    Causes SEIZURES  . Aspirin Nausea And Vomiting    Due to Stomach ulcer  . Ibuprofen Other (See Comments)    Due to stomach ulcer  . Penicillins Itching, Swelling and Rash    Breakout  Has patient had a PCN reaction causing immediate rash, facial/tongue/throat swelling, SOB or lightheadedness with hypotension: Yes Has patient had a PCN reaction causing severe rash involving mucus membranes or skin necrosis: Yes Has patient had  a PCN reaction that required hospitalization: Yes Has patient had a  PCN reaction occurring within the last 10 years: No If all of the above answers are "NO", then may proceed with Cephalosporin use.     . Pseudoephedrine Itching and Swelling    Tongue swelling  . Sulfa Antibiotics Nausea And Vomiting  . Latex Itching and Rash   I reviewed her past medical history, social history, family history, and environmental history and no significant changes have been reported from previous visits.  Review of Systems  Constitutional: Negative for activity change, appetite change, chills, diaphoresis, fatigue and fever.  HENT: Positive for congestion, postnasal drip, rhinorrhea, sinus pressure and sinus pain. Negative for sore throat.   Eyes: Negative for pain, discharge, redness and itching.  Respiratory: Negative for shortness of breath, wheezing and stridor.   Gastrointestinal: Negative for diarrhea, nausea and vomiting.  Endocrine: Negative for cold intolerance and heat intolerance.  Musculoskeletal: Negative for arthralgias, joint swelling and myalgias.  Skin: Negative for rash.  Allergic/Immunologic: Negative for environmental allergies and food allergies.    Objective:  Physical exam not obtained as encounter was done via telephone.   Previous notes and tests were reviewed.  I discussed the assessment and treatment plan with the patient. The patient was provided an opportunity to ask questions and all were answered. The patient agreed with the plan and demonstrated an understanding of the instructions.   The patient was advised to call back or seek an in-person evaluation if the symptoms worsen or if the condition fails to improve as anticipated.  I provided 31 minutes of non-face-to-face time during this encounter.  It was my pleasure to participate in Michelene Cazeau's care today. Please feel free to contact me with any questions or concerns.   Sincerely,  Valentina Shaggy, MD

## 2019-07-13 ENCOUNTER — Telehealth (INDEPENDENT_AMBULATORY_CARE_PROVIDER_SITE_OTHER): Payer: Medicaid Other | Admitting: Family Medicine

## 2019-07-13 ENCOUNTER — Other Ambulatory Visit: Payer: Self-pay

## 2019-07-13 DIAGNOSIS — R05 Cough: Secondary | ICD-10-CM

## 2019-07-13 DIAGNOSIS — R058 Other specified cough: Secondary | ICD-10-CM | POA: Insufficient documentation

## 2019-07-13 DIAGNOSIS — J0110 Acute frontal sinusitis, unspecified: Secondary | ICD-10-CM | POA: Diagnosis not present

## 2019-07-13 HISTORY — DX: Cough: R05

## 2019-07-13 HISTORY — DX: Other specified cough: R05.8

## 2019-07-13 NOTE — Progress Notes (Signed)
[facility] Telemedicine Visit  Patient consented to have virtual visit. Method of visit: Telephone  Encounter participants: Patient: [name] - located at home Provider: Wilber Oliphant - located at [facility] Others (if applicable): none  Chief Complaint: cough and cold symtpoms   HPI: Started to cough green and brown this week. Patient also reports headaches. No fevers. No sick contacts. Was put on doxycylcine 100mg  with 10 mg prednisone on Thursday, 07/09/19 by her allergist with whom she follows for asthma. She has been feeling better. At night, sleeping on multiple pillows due to difficulty breathing. No cardiac issues except for murmur. No BLEE.   ROS: per HPI  Pertinent PMHx: developmental delay, morbid obesity, ashtma  Exam:  Respiratory: Speaking in full sentences. No respiratory distress   Assessment/Plan:  Productive cough Patient recently diagnosed with illness on 07/09/19 and put on abx and prednisone. Patient requesting to be seen by a provider and to rule out COVID. Provided lab order and instructions for patient to go get tested. Explained to patient that telephone visit took place of in person doctor's visit as she has symptoms and we're trying to limit person to person contact. She was understanding.     Time spent during visit with patient: 9 minutes

## 2019-07-13 NOTE — Assessment & Plan Note (Signed)
Patient recently diagnosed with illness on 07/09/19 and put on abx and prednisone. Patient requesting to be seen by a provider and to rule out COVID. Provided lab order and instructions for patient to go get tested. Explained to patient that telephone visit took place of in person doctor's visit as she has symptoms and we're trying to limit person to person contact. She was understanding.

## 2019-07-14 ENCOUNTER — Ambulatory Visit: Payer: Self-pay | Admitting: Licensed Clinical Social Worker

## 2019-07-14 ENCOUNTER — Other Ambulatory Visit: Payer: Self-pay | Admitting: Family Medicine

## 2019-07-14 ENCOUNTER — Ambulatory Visit: Payer: Medicaid Other | Admitting: Allergy and Immunology

## 2019-07-14 NOTE — Chronic Care Management (AMB) (Signed)
   Social Work  Care Management Consultation  07/14/2019 Name: [name] MRN: [id number] DOB: 09-12-1975  [name] is a 44 y.o. year old female who sees [name], DO for primary care. LCSW was consulted by PCP for information /resources to assistance patient with an FL2 for in-home support from DSS. Patient was not interviewed or contacted during this encounter however LCSW reviewed chart, notes, insurance and collaborated with PCP .    Recommendation: After consultation, provider will complete FL2 and print copy of medication list to fax to DSS. Intervention:Disussed best way to complete FL2 that would meet patient's needs. Provided electronic copy of FL2 for PCP.   Review of patient status, including review of consultants reports, relevant laboratory and other test results, and collaboration with appropriate care team members and the patient's provider was performed as part of comprehensive patient evaluation and provision of chronic care management services.    Plan:  1. The care management team is available to follow up with the patient if further intervention is needed.  2. Please consult with patient and place referral if needed.  3. No further follow up required by LCSW at this time   Casimer Lanius, [place] / [place]   [phone] 4:36 PM

## 2019-07-15 ENCOUNTER — Ambulatory Visit: Payer: Self-pay | Admitting: Licensed Clinical Social Worker

## 2019-07-15 NOTE — Chronic Care Management (AMB) (Signed)
    Clinical Social Work  Care Management  07/15/2019 Name: OLAJUMOKE LOBERG MRN: [id number] DOB: 11-17-1975  Elizabelle A Sohn is a 44 y.o. year old female who is a primary care patient of [name], DO .   LCSW received phone call from patient asking for an update on her FL2.  Provided patient an update and informed her PCP is working on the [facility] and will fax it to DSS one it is complete.  Patient appreciative of information provided.   Review of patient status, including review of consultants reports, relevant laboratory and other test results, and collaboration with appropriate care team members and the patient's provider was performed as part of comprehensive patient evaluation and provision of care management services.    Follow Up Plan: No follow up needed  Casimer Lanius, [place] / [place]   [phone] 10:04 AM

## 2019-07-21 ENCOUNTER — Ambulatory Visit: Payer: Medicaid Other | Attending: Internal Medicine

## 2019-07-21 DIAGNOSIS — Z20822 Contact with and (suspected) exposure to covid-19: Secondary | ICD-10-CM

## 2019-07-22 LAB — NOVEL CORONAVIRUS, NAA: SARS-CoV-2, NAA: NOT DETECTED

## 2019-07-27 ENCOUNTER — Other Ambulatory Visit: Payer: Self-pay

## 2019-07-27 DIAGNOSIS — E038 Other specified hypothyroidism: Secondary | ICD-10-CM

## 2019-07-27 DIAGNOSIS — E039 Hypothyroidism, unspecified: Secondary | ICD-10-CM

## 2019-07-28 MED ORDER — LEVOTHYROXINE SODIUM 50 MCG PO TABS: 50.0000 ug | ORAL_TABLET | ORAL | 1 refills | Status: DC

## 2019-07-29 ENCOUNTER — Other Ambulatory Visit: Payer: Self-pay | Admitting: *Deleted

## 2019-07-29 DIAGNOSIS — E038 Other specified hypothyroidism: Secondary | ICD-10-CM

## 2019-07-29 DIAGNOSIS — E039 Hypothyroidism, unspecified: Secondary | ICD-10-CM

## 2019-08-04 ENCOUNTER — Ambulatory Visit: Payer: Medicaid Other | Admitting: Allergy and Immunology

## 2019-08-12 ENCOUNTER — Ambulatory Visit (INDEPENDENT_AMBULATORY_CARE_PROVIDER_SITE_OTHER): Payer: Medicaid Other | Admitting: Family Medicine

## 2019-08-12 ENCOUNTER — Encounter: Payer: Self-pay | Admitting: Family Medicine

## 2019-08-12 ENCOUNTER — Other Ambulatory Visit: Payer: Self-pay

## 2019-08-12 VITALS — BP 130/80 | HR 102 | Wt 209.2 lb

## 2019-08-12 DIAGNOSIS — Z0000 Encounter for general adult medical examination without abnormal findings: Secondary | ICD-10-CM

## 2019-08-12 DIAGNOSIS — E038 Other specified hypothyroidism: Secondary | ICD-10-CM

## 2019-08-12 DIAGNOSIS — Z6836 Body mass index (BMI) 36.0-36.9, adult: Secondary | ICD-10-CM

## 2019-08-12 DIAGNOSIS — E039 Hypothyroidism, unspecified: Secondary | ICD-10-CM | POA: Diagnosis present

## 2019-08-12 DIAGNOSIS — K743 Primary biliary cirrhosis: Secondary | ICD-10-CM

## 2019-08-12 NOTE — Assessment & Plan Note (Signed)
Will obtain mammogram records.  Due for next mammogram in 09/2019.

## 2019-08-12 NOTE — Assessment & Plan Note (Signed)
Follows with [name], s/p liver transplant on chronic immunosuppression.  Had labs obtained today, will follow these up as well.

## 2019-08-12 NOTE — Progress Notes (Signed)
    SUBJECTIVE:   CHIEF COMPLAINT / HPI: Follow-up thyroid  [name] is a 44 year old female presenting for follow-up of her thyroid.  Reports she is doing well today and has no concerns otherwise.  Recently had labs drawn for her hepatologist earlier today by her home RN.  Hypothyroidism: Started on Synthroid in 2020, increased to 50 mcg on 06/23/2019 due to elevated TSH on 7mcg.  She is here for follow-up of her TSH.  Says she is doing well, tolerating the 50 mcg dose.  Denies any fatigue, heat/cold insensitivity, constipation/diarrhea, muscle weakness/cramping.  Health maintenance: Last had a mammogram in May 2020, has contact information for the breast center to follow-up in May.  Reports she will have a colonoscopy next year through her GI specialist.  No known family history of colon cancers.   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  Will recheck a TSH today.  Morbid obesity (HCC) BMI 35.9.  Working towards a well-balanced diet and staying active.  Will check lipid panel, previous hypercholesteremia in 2018.  She is also had several A1c screenings in the past which have been consistently normal.  Healthcare maintenance Will obtain mammogram records.  Due for next mammogram in 09/2019.  Primary biliary cirrhosis (HCC) Follows with [name], s/p liver transplant on chronic immunosuppression.   Had labs obtained today, will follow these up as well.    Pending lab results, likely to follow-up in 6 months or sooner if needed  [name], [place]

## 2019-08-12 NOTE — Assessment & Plan Note (Signed)
Increased to 50 mcg in 05/2019, tolerating this well.  Will recheck a TSH today.

## 2019-08-12 NOTE — Patient Instructions (Addendum)
It was so wonderful seeing you today.  Please make sure you schedule your mammogram a little bit later this year when you get an email.   We are checking your cholesterol and thyroid levels today, I will give you a call the next several days when they result or send you a MyChart message.  If everything looks good, I have you come back in 6 months or sooner if needed.

## 2019-08-12 NOTE — Assessment & Plan Note (Signed)
BMI 35.9.  Working towards a well-balanced diet and staying active.  Will check lipid panel, previous hypercholesteremia in 2018.  She is also had several A1c screenings in the past which have been consistently normal.

## 2019-08-13 ENCOUNTER — Ambulatory Visit: Payer: Medicaid Other | Attending: Internal Medicine

## 2019-08-13 ENCOUNTER — Encounter: Payer: Self-pay | Admitting: Family Medicine

## 2019-08-13 DIAGNOSIS — Z23 Encounter for immunization: Secondary | ICD-10-CM

## 2019-08-13 LAB — LIPID PANEL
Chol/HDL Ratio: 4.4 ratio (ref 0.0–4.4)
Cholesterol, Total: 214 mg/dL — ABNORMAL HIGH (ref 100–199)
HDL: 49 mg/dL (ref >39)
LDL Chol Calc (NIH): 129 mg/dL — ABNORMAL HIGH (ref 0–99)
Triglycerides: 201 mg/dL — ABNORMAL HIGH (ref 0–149)
VLDL Cholesterol Cal: 36 mg/dL (ref 5–40)

## 2019-08-13 LAB — TSH: TSH: 1.68 u[IU]/mL (ref 0.450–4.500)

## 2019-08-13 NOTE — Progress Notes (Signed)
   Covid-19 Vaccination Clinic  Name:  [name]    MRN: [id number] DOB: [birth year]  08/13/2019  [name] was observed post Covid-19 immunization for 30 minutes based on pre-vaccination screening without incident. She was provided with Vaccine Information Sheet and instruction to access the V-Safe system.   [name] was instructed to call 911 with any severe reactions post vaccine: Marland Kitchen Difficulty breathing  . Swelling of face and throat  . A fast heartbeat  . A bad rash all over body  . Dizziness and weakness   Immunizations Administered    Name Date Dose VIS Date Route   Pfizer COVID-19 Vaccine 08/13/2019 10:37 AM 0.3 mL 05/08/2019 Intramuscular   Manufacturer: [place]   Lot: [id number]   [place]: [id number]

## 2019-09-01 ENCOUNTER — Encounter: Payer: Self-pay | Admitting: Allergy and Immunology

## 2019-09-01 ENCOUNTER — Other Ambulatory Visit: Payer: Self-pay

## 2019-09-01 ENCOUNTER — Ambulatory Visit: Payer: Medicaid Other | Admitting: Allergy and Immunology

## 2019-09-01 VITALS — BP 110/78 | HR 105 | Temp 97.7°F | Resp 18 | Ht 59.0 in | Wt 211.6 lb

## 2019-09-01 DIAGNOSIS — J4540 Moderate persistent asthma, uncomplicated: Secondary | ICD-10-CM | POA: Diagnosis not present

## 2019-09-01 DIAGNOSIS — J3089 Other allergic rhinitis: Secondary | ICD-10-CM

## 2019-09-01 DIAGNOSIS — L989 Disorder of the skin and subcutaneous tissue, unspecified: Secondary | ICD-10-CM

## 2019-09-01 DIAGNOSIS — K219 Gastro-esophageal reflux disease without esophagitis: Secondary | ICD-10-CM | POA: Diagnosis not present

## 2019-09-01 DIAGNOSIS — L308 Other specified dermatitis: Secondary | ICD-10-CM

## 2019-09-01 DIAGNOSIS — J302 Other seasonal allergic rhinitis: Secondary | ICD-10-CM

## 2019-09-01 NOTE — Patient Instructions (Addendum)
  1.  Continue Symbicort 160 2 inhalations twice a day with spacer / mask  2. Continue montelukast 10 mg one tablet once a day  3. Continue nasal Azelastine and fluticasone 1 spray each nostril twice a day  4. Continue cetirizine 10 mg one tablet once a day  5. Continue ProAir HFA 2 puffs every 4-6 hours if needed  6. Continue Mometasone 0.1% ointment 1-2 times a day if needed   7. Continue Pantoprazole 40 mg 2 times per day   8. Return to clinic in 6 months or earlier if problem

## 2019-09-01 NOTE — Progress Notes (Signed)
[place] [facility] [place] [facility] [place]   Follow-up Note   Referring Provider: [name], DO Primary Provider: [name], DO Date of Office Visit: 09/01/2019  Subjective:   [name] (DOB: 03/03/1976) is a 44 y.o. female who returns to [facility] on 09/01/2019 in re-evaluation of the following:  HPI: [name] returns to this clinic in reevaluation of asthma and allergic rhinoconjunctivitis and eczema in the setting of immunosuppression with CellCept and cyclosporine for liver transplant.  She apparently had a respiratory tract flareup associated with ugly sputum production and coughing for which she was contacted by Dr. Ernst Bowler on 09 July 2019 and given a course of prednisone and doxycycline.  This issue has completely resolved.  At this point, and prior to her recent flareup addressed with Dr. Ernst Bowler, she was really doing very well without the need for short acting bronchodilator while she consistently use Symbicort and montelukast.  Her nose was doing well while using azelastine and nasal fluticasone.  She did not require systemic steroid or an antibiotic during that timeframe except for her recent flareup addressed by Dr. Ernst Bowler.  Her eczema is under very good control at this point in time with topical use of mometasone mostly to her hands.  Her reflux is under very good control at this point in time while using pantoprazole twice a day.  She has obtained her first Covid vaccination and will be having her second Moderna vaccine in April.  Allergies as of 09/01/2019      Reactions   Tramadol Hcl Itching, Nausea And Vomiting   Causes SEIZURES   Aspirin Nausea And Vomiting   Due to Stomach ulcer   Ibuprofen Other (See Comments)   Due to stomach ulcer   Penicillins Itching, Swelling, Rash   Breakout  Has patient had a PCN reaction causing immediate rash, facial/tongue/throat swelling, SOB or lightheadedness with  hypotension: Yes Has patient had a PCN reaction causing severe rash involving mucus membranes or skin necrosis: Yes Has patient had a PCN reaction that required hospitalization: Yes Has patient had a PCN reaction occurring within the last 10 years: No If all of the above answers are "NO", then may proceed with Cephalosporin use.     Pseudoephedrine Itching, Swelling   Tongue swelling   Sulfa Antibiotics Nausea And Vomiting   Latex Itching, Rash      Medication List      Accu-Chek Guide test strip Generic drug: glucose blood USE TO CHECK BLOOD SUGAR EVERY DAY   Accu-Chek Guide w/Device Kit 1 each by Does not apply route daily. Use to check blood sugar once daily or as directed.   Accu-Chek Softclix Lancets lancets Use to check blood sugar once daily in the morning prior to eating.   azelastine 0.1 % nasal spray Commonly known as: ASTELIN Place 2 sprays into both nostrils 2 (two) times daily.   benztropine 0.5 MG tablet Commonly known as: COGENTIN Take 0.5 mg by mouth at bedtime.   budesonide 0.5 MG/2ML nebulizer solution Commonly known as: PULMICORT Take 2 mLs (0.5 mg total) by nebulization 2 (two) times a day.   budesonide-formoterol 160-4.5 MCG/ACT inhaler Commonly known as: SYMBICORT Inhale 2 puffs into the lungs 2 (two) times daily. Use with spacer and mask.   cetirizine 10 MG tablet Commonly known as: ZYRTEC TAKE 1 TABLET(10 MG) BY MOUTH DAILY   cycloSPORINE modified 100 MG Caps Take 100-125 mg by mouth 2 (two) times daily.  Take 100 mg in the AM and 125 mg in the PM   cycloSPORINE modified 25 MG capsule Commonly known as: NEORAL Use in combination with 100 mg capsules for PM dose of 125 mg.   divalproex 500 MG 24 hr tablet Commonly known as: DEPAKOTE ER Take 2 tablets (1,000 mg total) by mouth at bedtime.   doxepin 25 MG capsule Commonly known as: SINEQUAN TK 1 C PO HS   doxycycline 100 MG capsule Commonly known as: MONODOX Take 1 capsule (100 mg  total) by mouth 2 (two) times daily.   famotidine 40 MG tablet Commonly known as: PEPCID Take 1 tablet (40 mg total) by mouth every evening.   FIBER (GUAR GUM) PO Take by mouth.   fluticasone 50 MCG/ACT nasal spray Commonly known as: FLONASE Place 1 spray into both nostrils 2 (two) times daily.   hydrocortisone valerate cream 0.2 % Commonly known as: WESTCORT APPLY 1 APPLICATION TOPICALLY TO AFFECTED AREAS ON HANDS AND FEET BID PRN FOR DERMATITIS   Lac-Hydrin 12 % cream Generic drug: ammonium lactate Apply topically 2 (two) times daily.   levothyroxine 50 MCG tablet Commonly known as: SYNTHROID Take 1 tablet (50 mcg total) by mouth every morning. 30 minutes before food   magnesium oxide 400 MG tablet Commonly known as: MAG-OX Take 400 mg by mouth 2 (two) times daily. 5pm & 7pm.   mometasone 0.1 % cream Commonly known as: Elocon Apply 1 application topically 2 (two) times daily.   montelukast 10 MG tablet Commonly known as: SINGULAIR TAKE 1 TABLET BY MOUTH AT BEDTIME   multivitamin tablet Take 1 tablet by mouth daily. 9 am.   mycophenolate 250 MG capsule Commonly known as: CELLCEPT Take 500 mg by mouth 2 (two) times daily. 9 am & 9 pm.   Nebulizer/Tubing/Mouthpiece Kit 2 each by Does not apply route as directed.   Nebulizers Misc 1 kit by Does not apply route as directed.   omeprazole 40 MG capsule Commonly known as: PRILOSEC Take 1 capsule (40 mg total) by mouth 2 (two) times daily.   Perforomist 20 MCG/2ML nebulizer solution Generic drug: formoterol Take 2 mLs (20 mcg total) by nebulization 2 (two) times daily.   polyethylene glycol 17 g packet Commonly known as: MIRALAX / GLYCOLAX Take 17 g by mouth daily as needed (for constipation). In water or juice   prazosin 1 MG capsule Commonly known as: MINIPRESS Take 2 mg by mouth at bedtime.   predniSONE 10 MG tablet Commonly known as: DELTASONE Take two tablets (8m) twice daily for three days, then  one tablet (166m twice daily for three days, then STOP   ProAir HFA 108 (90 Base) MCG/ACT inhaler Generic drug: albuterol Inhale 1 puff into the lungs every 4 (four) hours as needed for shortness of breath.   albuterol (2.5 MG/3ML) 0.083% nebulizer solution Commonly known as: PROVENTIL 1 vial via nebulizer every 4-6 hours as needed.   RA Vitamin D-3 50 MCG (2000 UT) Caps Generic drug: Cholecalciferol Take 1 capsule by mouth daily.   Refresh Tears 0.5 % Soln Generic drug: carboxymethylcellulose Place 1 drop into both eyes 4 (four) times daily as needed (for dry eyes).   risperiDONE 3 MG tablet Commonly known as: RISPERDAL Take 3 mg by mouth at bedtime.   Spacer/Aero-Hold ChGannett Cose as directed with inhaler.       Past Medical History:  Diagnosis Date  . Abnormal vaginal bleeding   . Allergy   . Anemia   .  Arthritis    right knee  . Asthma 09-23-12   Asthma somewhat causing increase wheezing and mild congestion at present  . Biliary cirrhosis ([place]) 04/26/11  . Constipation   . DUB (dysfunctional uterine bleeding)   . Fall at home, initial encounter 01/21/2019  . GERD (gastroesophageal reflux disease)   . Health care maintenance 03/06/2014  . Intellectual disability   . Left-sided Bell's palsy 12/22/2018  . Liver transplant recipient [facility])   . Lumbar back pain 04/06/2014  . PONV (postoperative nausea and vomiting)   . 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 Seizures ([place])    congenital epilepsy-hx. seizures  . Vitamin D deficiency     Past Surgical History:  Procedure Laterality Date  . adenonoidectomy    . APPENDECTOMY    . CHOLECYSTECTOMY    . COLONOSCOPY W/ BIOPSIES    . ESOPHAGOGASTRODUODENOSCOPY (EGD) WITH PROPOFOL N/A 10/01/2012   Procedure: ESOPHAGOGASTRODUODENOSCOPY (EGD) WITH PROPOFOL;  Surgeon: Arta Silence, MD;  Location: [facility] ENDOSCOPY;  Service: Endoscopy;  Laterality: N/A;  . eustacean tubes    . EYE MUSCLE SURGERY    . LIVER  TRANSPLANT      Review of systems negative except as noted in HPI / PMHx or noted below:  Review of Systems  Constitutional: Negative.   HENT: Negative.   Eyes: Negative.   Respiratory: Negative.   Cardiovascular: Negative.   Gastrointestinal: Negative.   Genitourinary: Negative.   Musculoskeletal: Negative.   Skin: Negative.   Neurological: Negative.   Endo/Heme/Allergies: Negative.   Psychiatric/Behavioral: Negative.      Objective:   Vitals:   09/01/19 1400  BP: 110/78  Pulse: (!) 105  Resp: 18  Temp: 97.7 F (36.5 C)  SpO2: 99%   Height: '4\' 11"'  (149.9 cm)  Weight: 211 lb 9.6 oz (96 kg)   Physical Exam Constitutional:      Appearance: She is not diaphoretic.  HENT:     Head: Normocephalic.     Right Ear: Tympanic membrane, ear canal and external ear normal.     Left Ear: Tympanic membrane, ear canal and external ear normal.     Nose: Nose normal. No mucosal edema or rhinorrhea.     Mouth/Throat:     Pharynx: Uvula midline. No oropharyngeal exudate.  Eyes:     Conjunctiva/sclera: Conjunctivae normal.  Neck:     Thyroid: No thyromegaly.     Trachea: Trachea normal. No tracheal tenderness or tracheal deviation.  Cardiovascular:     Rate and Rhythm: Normal rate and regular rhythm.     Heart sounds: Normal heart sounds, S1 normal and S2 normal. No murmur.  Pulmonary:     Effort: No respiratory distress.     Breath sounds: Normal breath sounds. No stridor. No wheezing or rales.  Lymphadenopathy:     Head:     Right side of head: No tonsillar adenopathy.     Left side of head: No tonsillar adenopathy.     Cervical: No cervical adenopathy.  Skin:    Findings: No erythema or rash.     Nails: There is no clubbing.  Neurological:     Mental Status: She is alert.     Diagnostics:    Spirometry was performed and demonstrated an FEV1 of 1.84 at 92 % of predicted.  The patient had an Asthma Control Test with the following results: ACT Total Score: 20.     Assessment and Plan:   1. Asthma, moderate persistent, well-controlled   2. Seasonal allergies  3. Other allergic rhinitis   4. LPRD (laryngopharyngeal reflux disease)   5. Inflammatory dermatosis     1.  Continue Symbicort 160 2 inhalations twice a day with spacer / mask  2. Continue montelukast 10 mg one tablet once a day  3. Continue nasal Azelastine and fluticasone 1 spray each nostril twice a day  4. Continue cetirizine 10 mg one tablet once a day  5. Continue ProAir HFA 2 puffs every 4-6 hours if needed  6. Continue Mometasone 0.1% ointment 1-2 times a day if needed   7. Continue Pantoprazole 40 mg 2 times per day   8. Return to clinic in 6 months or earlier if problem  Arlynn appears to be doing very well at this point in time and she will remain on anti-inflammatory agents for her airway and skin as noted above and continue to treat reflux.  Assuming she does well with this plan I will see her back in this clinic in 6 months or earlier if there is a problem.  Allena Katz, MD Allergy / Immunology [place]

## 2019-09-02 ENCOUNTER — Encounter: Payer: Self-pay | Admitting: Allergy and Immunology

## 2019-09-02 MED ORDER — MONTELUKAST SODIUM 10 MG PO TABS: 10.0000 mg | ORAL_TABLET | Freq: Every day | ORAL | 1 refills | Status: DC

## 2019-09-02 MED ORDER — FLUTICASONE PROPIONATE 50 MCG/ACT NA SUSP: 1.0000 | Freq: Two times a day (BID) | NASAL | 5 refills | Status: DC

## 2019-09-07 ENCOUNTER — Ambulatory Visit: Payer: Medicaid Other | Attending: Internal Medicine

## 2019-09-07 DIAGNOSIS — Z23 Encounter for immunization: Secondary | ICD-10-CM

## 2019-09-07 NOTE — Progress Notes (Signed)
   Covid-19 Vaccination Clinic  Name:  [name]    MRN: [id number] DOB: 1975/09/24  09/07/2019  [name] was observed post Covid-19 immunization for 15 minutes without incident. She was provided with Vaccine Information Sheet and instruction to access the V-Safe system.   [name] was instructed to call 911 with any severe reactions post vaccine: [place] Kitchen Difficulty breathing  . Swelling of face and throat  . A fast heartbeat  . A bad rash all over body  . Dizziness and weakness   Immunizations Administered    Name Date Dose VIS Date Route   Pfizer COVID-19 Vaccine 09/07/2019  8:46 AM 0.3 mL 05/08/2019 Intramuscular   Manufacturer: [place]   Lot: [id number]   [place]: [id number]

## 2019-09-09 ENCOUNTER — Other Ambulatory Visit: Payer: Self-pay | Admitting: Allergy and Immunology

## 2019-09-15 ENCOUNTER — Other Ambulatory Visit: Payer: Self-pay | Admitting: Family Medicine

## 2019-09-15 DIAGNOSIS — Z1231 Encounter for screening mammogram for malignant neoplasm of breast: Secondary | ICD-10-CM

## 2019-10-22 ENCOUNTER — Other Ambulatory Visit: Payer: Self-pay | Admitting: *Deleted

## 2019-10-22 DIAGNOSIS — E038 Other specified hypothyroidism: Secondary | ICD-10-CM

## 2019-10-22 MED ORDER — LEVOTHYROXINE SODIUM 50 MCG PO TABS: 50.0000 ug | ORAL_TABLET | ORAL | 1 refills | Status: DC

## 2019-11-24 ENCOUNTER — Telehealth: Payer: Self-pay

## 2019-11-24 NOTE — Telephone Encounter (Signed)
Awesome, thank you!   [name], DO

## 2019-11-24 NOTE — Telephone Encounter (Signed)
Patient calls nurse line wanting to update PCP on new iron pill. Patient reports she was seen by her transplant provider and her blood count was low. They started her on one iron pill every morning. Patient reports he also advised her to schedule a colonoscopy, which she has done for the end of July.

## 2019-11-26 HISTORY — PX: COLONOSCOPY W/ BIOPSIES: SHX1374

## 2019-12-01 ENCOUNTER — Ambulatory Visit: Payer: Medicaid Other

## 2019-12-08 ENCOUNTER — Other Ambulatory Visit: Payer: Self-pay

## 2019-12-08 ENCOUNTER — Telehealth: Payer: Self-pay

## 2019-12-08 MED ORDER — PROAIR HFA 108 (90 BASE) MCG/ACT IN AERS: 1.0000 | INHALATION_SPRAY | RESPIRATORY_TRACT | 2 refills | Status: DC | PRN

## 2019-12-08 MED ORDER — BUDESONIDE-FORMOTEROL FUMARATE 160-4.5 MCG/ACT IN AERO: 2.0000 | INHALATION_SPRAY | Freq: Two times a day (BID) | RESPIRATORY_TRACT | 5 refills | Status: DC

## 2019-12-08 NOTE — Telephone Encounter (Signed)
Patient needs refills on Symbicort and Pro-Air, sent refills ins

## 2019-12-08 NOTE — Telephone Encounter (Signed)
Sending in pro-air and Symbicort refills

## 2019-12-08 NOTE — Telephone Encounter (Signed)
Patient called stating the pharmacy has sent a request for medication refills but our office has not responded.  Patient needs a refill on her Symbicort & ProAir.  Patient states she has also been having problems with her Asthma for about 3 weeks. Patients states she has been doing breathing treatments and her inhalers. Patients gets better but worse at night time time. She starts wheezing & coughing.   Please Advise.    [place]

## 2019-12-08 NOTE — Telephone Encounter (Signed)
Error

## 2019-12-28 ENCOUNTER — Ambulatory Visit: Payer: Medicaid Other | Admitting: Family Medicine

## 2019-12-29 ENCOUNTER — Other Ambulatory Visit: Payer: Self-pay

## 2019-12-29 ENCOUNTER — Ambulatory Visit
Admission: RE | Admit: 2019-12-29 | Discharge: 2019-12-29 | Disposition: A | Discharge status: Home / Self Care | Payer: Medicaid Other | Source: Ambulatory Visit | Attending: *Deleted | Admitting: *Deleted

## 2019-12-29 DIAGNOSIS — Z1231 Encounter for screening mammogram for malignant neoplasm of breast: Secondary | ICD-10-CM

## 2020-01-12 ENCOUNTER — Encounter: Payer: Self-pay | Admitting: Family Medicine

## 2020-01-12 ENCOUNTER — Ambulatory Visit (INDEPENDENT_AMBULATORY_CARE_PROVIDER_SITE_OTHER): Payer: Medicaid Other | Admitting: Family Medicine

## 2020-01-12 ENCOUNTER — Other Ambulatory Visit: Payer: Self-pay

## 2020-01-12 VITALS — BP 116/74 | HR 78 | Ht 59.0 in | Wt 211.0 lb

## 2020-01-12 DIAGNOSIS — R600 Localized edema: Secondary | ICD-10-CM | POA: Diagnosis not present

## 2020-01-12 DIAGNOSIS — L602 Onychogryphosis: Secondary | ICD-10-CM | POA: Diagnosis present

## 2020-01-12 DIAGNOSIS — Z944 Liver transplant status: Secondary | ICD-10-CM

## 2020-01-12 DIAGNOSIS — R609 Edema, unspecified: Secondary | ICD-10-CM

## 2020-01-12 NOTE — Patient Instructions (Addendum)
Wonderful to see you today.  I will check in on the labs that you get drawn tomorrow.  We will also check some urine today, that should come back in the next several days.  Try to elevate your legs as often as possible and either get tighter socks or try Ace bandaging your legs tighter the bottom and looser at the top to help with any swelling.  Additionally, I have placed referral to podiatry and we will check to see if this is covered through [facility] for more extensive nail care.  Follow-up in 3 months, or sooner if the swelling is not improving or worsening.

## 2020-01-12 NOTE — Progress Notes (Signed)
    SUBJECTIVE:   CHIEF COMPLAINT / HPI: SCAT form/foot eval   [name] is a 44 year old female presenting for follow-up and discuss the following:  Liver transplant: Recently decreased her cyclosporine to 100 mg from 125 per [name] transplant. She is due for basic labs tomorrow through her home health RN.  Toenail trim: She has significantly thickened that she cannot trim at home and would like to have them cut today. Irritating to her. She previously went to podiatrist, however not sure if it would be covered by her Medicaid now.  Bilateral pedal edema: Present for the past few weeks. Better when she elevates her legs. Has not tried anything to make it better. No associated ankle pain, shortness of breath, chest pain, palpitations, rash, or orthopnea. No preceding trauma or injury to the areas. Equal on both sides.  She needs her scat form filled out today for continued transportation.   PERTINENT  PMH / PSH: Asthma, primary biliary cirrhosis s/p liver transplant on chronic immunosuppression, seizure disorder, subclinical hypothyroidism, developmental delay with moderate cognitive disability, anemia   OBJECTIVE:   BP 116/74   Pulse 78   Ht 4\' 11"  (1.499 m)   Wt 211 lb (95.7 kg)   BMI 42.62 kg/m   General: Alert, NAD HEENT: NCAT  Cardiac: RRR Lungs: Clear bilaterally, no increased WOB on room air Abdomen: soft, non-tender, non-distended, normoactive BS Msk: Moves all extremities spontaneously  Ext: Warm, dry, 2+ distal pulses, all toenails with yellow discoloration and thickened hypertrophic nailbeds that are overgrown and bending inward. Trace pedal edema bilaterally without any associated lower extremity pitting edema. No overlying skin erythema or rash.  ASSESSMENT/PLAN:   Onychogryphosis In need of more in-depth foot care, placed referral to podiatry for evaluation.  Pedal edema Mild. Known anemia, obesity, and PSC s/p liver transplant with hypoalbuminemia likely contributing.  Labs via [facility] RN tomorrow, will follow up on these to ensure no worsening anemia or renal/liver function. No concurrent symptomatology suggest secondary to heart failure, reassuring EF/Lexiscan in 2018. Will rule out nephrotic proteinuria with Ucr/protein ratio today. Recommended compression stockings/Ace bandage wrapping LE and elevating as often as possible. Pending labs, follow-up if not improving or sooner if worsening.  Liver transplant recipient [facility]) Continue close follow-up with [name] transplant. Cyclosporine dose updated in epic.    Completed SCAT form. Follow up in 3 months or sooner for above.   [name], [place]

## 2020-01-13 LAB — PROTEIN / CREATININE RATIO, URINE
Creatinine, Urine: 107.8 mg/dL
Protein, Ur: 5.7 mg/dL
Protein/Creat Ratio: 53 mg/g creat (ref 0–200)

## 2020-01-14 ENCOUNTER — Encounter: Payer: Self-pay | Admitting: Family Medicine

## 2020-01-14 DIAGNOSIS — R600 Localized edema: Secondary | ICD-10-CM

## 2020-01-14 DIAGNOSIS — L602 Onychogryphosis: Secondary | ICD-10-CM

## 2020-01-14 HISTORY — DX: Onychogryphosis: L60.2

## 2020-01-14 HISTORY — DX: Localized edema: R60.0

## 2020-01-14 NOTE — Assessment & Plan Note (Addendum)
Continue close follow-up with [name] transplant. Cyclosporine dose updated in epic.

## 2020-01-14 NOTE — Assessment & Plan Note (Addendum)
In need of more in-depth foot care, placed referral to podiatry for evaluation.

## 2020-01-14 NOTE — Assessment & Plan Note (Addendum)
Mild. Known anemia, obesity, and PSC s/p liver transplant with hypoalbuminemia likely contributing. Labs via [facility] RN tomorrow, will follow up on these to ensure no worsening anemia or renal/liver function. No concurrent symptomatology suggest secondary to heart failure, reassuring EF/Lexiscan in 2018. Will rule out nephrotic proteinuria with Ucr/protein ratio today. Recommended compression stockings/Ace bandage wrapping LE and elevating as often as possible. Pending labs, follow-up if not improving or sooner if worsening.

## 2020-02-16 ENCOUNTER — Other Ambulatory Visit: Payer: Self-pay | Admitting: *Deleted

## 2020-02-16 MED ORDER — DIVALPROEX SODIUM ER 500 MG PO TB24: 1000.0000 mg | ORAL_TABLET | Freq: Every day | ORAL | 0 refills | Status: DC

## 2020-03-08 ENCOUNTER — Ambulatory Visit: Payer: Medicaid Other | Admitting: Allergy and Immunology

## 2020-03-09 ENCOUNTER — Other Ambulatory Visit: Payer: Self-pay | Admitting: Allergy and Immunology

## 2020-03-16 ENCOUNTER — Other Ambulatory Visit: Payer: Self-pay | Admitting: Allergy and Immunology

## 2020-03-21 ENCOUNTER — Other Ambulatory Visit: Payer: Self-pay | Admitting: Family Medicine

## 2020-03-21 ENCOUNTER — Telehealth: Payer: Self-pay

## 2020-03-21 NOTE — Telephone Encounter (Signed)
Patient calls nurse line to request an rx for BP machine to be sent in to pharmacy.  Hypertension is not on current problem list. Please advise if this can be sent into pharmacy.   To PCP  Talbot Grumbling, RN

## 2020-03-22 NOTE — Telephone Encounter (Signed)
Patient calling to check the status of blood pressure monitor.

## 2020-03-23 MED ORDER — BLOOD PRESSURE MONITOR AUTOMAT DEVI: 0 refills | Status: DC

## 2020-03-23 NOTE — Telephone Encounter (Signed)
Sent in blood pressure cuff electronically to her pharmacy.  Often times I do not have success with this being filled via electronic prescription, if her pharmacy does not receive this please have her let me know and I will print a prescription and either mail it to her or have it for pickup.  [name], DO

## 2020-03-29 ENCOUNTER — Ambulatory Visit: Payer: Medicaid Other | Admitting: Allergy and Immunology

## 2020-03-29 ENCOUNTER — Other Ambulatory Visit: Payer: Self-pay | Admitting: Allergy and Immunology

## 2020-03-29 ENCOUNTER — Other Ambulatory Visit: Payer: Self-pay

## 2020-03-29 ENCOUNTER — Encounter: Payer: Self-pay | Admitting: Allergy and Immunology

## 2020-03-29 VITALS — BP 118/78 | HR 101 | Resp 20

## 2020-03-29 DIAGNOSIS — L989 Disorder of the skin and subcutaneous tissue, unspecified: Secondary | ICD-10-CM

## 2020-03-29 DIAGNOSIS — D849 Immunodeficiency, unspecified: Secondary | ICD-10-CM

## 2020-03-29 DIAGNOSIS — K219 Gastro-esophageal reflux disease without esophagitis: Secondary | ICD-10-CM

## 2020-03-29 DIAGNOSIS — J4540 Moderate persistent asthma, uncomplicated: Secondary | ICD-10-CM

## 2020-03-29 DIAGNOSIS — J3089 Other allergic rhinitis: Secondary | ICD-10-CM | POA: Diagnosis not present

## 2020-03-29 MED ORDER — SPIRIVA RESPIMAT 1.25 MCG/ACT IN AERS: 2.0000 | INHALATION_SPRAY | Freq: Every day | RESPIRATORY_TRACT | 5 refills | Status: DC

## 2020-03-29 NOTE — Progress Notes (Signed)
[name]   Follow-up Note  Referring Provider: [name], DO Primary Provider: [name], DO Date of Office Visit: 03/29/2020  Subjective:   [name] (DOB: 1976-04-18) is a 44 y.o. female who returns to the [facility] on 03/29/2020 in re-evaluation of the following:  HPI: [name] returns to this clinic in evaluation of asthma and allergic rhinoconjunctivitis and inflammatory dermatosis in the setting of immunosuppression with CellCept and cyclosporine for liver transplant.  Her last visit to this clinic was 01 September 2019.  She has really done well with her asthma and rarely uses a short acting bronchodilator and has not require a systemic steroid to treat an exacerbation.  However, during the month of October she has had a little bit more cough and some more wheezing.  Her cough does appear to occur both during the daytime and nighttime.  She has been using a short acting bronchodilator and she does respond to that short acting bronchodilator.  She has had little bit of runny nose as well without any anosmia or ugly nasal discharge or headaches.  Her reflux has been under excellent control at this point in time on her current therapy.  Her eczema has been doing been doing very well while using topical mometasone about 1 time per week.  She has received 2 Moderna Covid vaccines and a flu vaccine this year.  She continues on immunosuppression with CellCept and cyclosporine.  Allergies as of 03/29/2020      Reactions   Tramadol Hcl Itching, Nausea And Vomiting   Causes SEIZURES   Aspirin Nausea And Vomiting   Due to Stomach ulcer   Ibuprofen Other (See Comments)   Due to stomach ulcer   Penicillins Itching, Swelling, Rash   Breakout  Has patient had a PCN reaction causing immediate rash, facial/tongue/throat swelling, SOB or lightheadedness with hypotension: Yes Has patient had a PCN reaction  causing severe rash involving mucus membranes or skin necrosis: Yes Has patient had a PCN reaction that required hospitalization: Yes Has patient had a PCN reaction occurring within the last 10 years: No If all of the above answers are "NO", then may proceed with Cephalosporin use.     Pseudoephedrine Itching, Swelling   Tongue swelling   Sulfa Antibiotics Nausea And Vomiting   Latex Itching, Rash      Medication List    Accu-Chek Guide test strip Generic drug: glucose blood USE TO CHECK BLOOD SUGAR EVERY DAY   Accu-Chek Guide w/Device Kit 1 each by Does not apply route daily. Use to check blood sugar once daily or as directed.   Accu-Chek Softclix Lancets lancets Use to check blood sugar once daily in the morning prior to eating.   albuterol (2.5 MG/3ML) 0.083% nebulizer solution Commonly known as: PROVENTIL 1 vial via nebulizer every 4-6 hours as needed.   ProAir HFA 108 (90 Base) MCG/ACT inhaler Generic drug: albuterol Inhale 1-2 puffs into the lungs every 4 (four) hours as needed for shortness of breath.   azelastine 0.1 % nasal spray Commonly known as: ASTELIN Place 2 sprays into both nostrils 2 (two) times daily.   benztropine 0.5 MG tablet Commonly known as: COGENTIN Take 0.5 mg by mouth at bedtime.   Blood Pressure Monitor Automat Devi Please use to check blood pressure daily/ as needed.   budesonide 0.5 MG/2ML nebulizer solution Commonly known as: PULMICORT Take 2 mLs (0.5 mg total) by nebulization 2 (  two) times a day.   budesonide-formoterol 160-4.5 MCG/ACT inhaler Commonly known as: SYMBICORT Inhale 2 puffs into the lungs 2 (two) times daily. Use with spacer and mask.   cetirizine 10 MG tablet Commonly known as: ZYRTEC TAKE 1 TABLET(10 MG) BY MOUTH DAILY   cycloSPORINE modified 100 MG Caps Take 100-125 mg by mouth 2 (two) times daily. Take 100 mg in the AM and 125 mg in the PM   divalproex 500 MG 24 hr tablet Commonly known as: DEPAKOTE ER Take 2  tablets (1,000 mg total) by mouth at bedtime.   doxepin 25 MG capsule Commonly known as: SINEQUAN TK 1 C PO HS   doxycycline 100 MG capsule Commonly known as: MONODOX Take 1 capsule (100 mg total) by mouth 2 (two) times daily.   famotidine 40 MG tablet Commonly known as: PEPCID Take 1 tablet (40 mg total) by mouth every evening.   FIBER (GUAR GUM) PO Take by mouth.   fluticasone 50 MCG/ACT nasal spray Commonly known as: FLONASE Place 1 spray into both nostrils 2 (two) times daily.   hydrocortisone valerate cream 0.2 % Commonly known as: WESTCORT APPLY 1 APPLICATION TOPICALLY TO AFFECTED AREAS ON HANDS AND FEET BID PRN FOR DERMATITIS   Lac-Hydrin 12 % cream Generic drug: ammonium lactate Apply topically 2 (two) times daily.   levothyroxine 50 MCG tablet Commonly known as: SYNTHROID Take 1 tablet (50 mcg total) by mouth every morning. 30 minutes before food   magnesium oxide 400 MG tablet Commonly known as: MAG-OX Take 400 mg by mouth 2 (two) times daily. 5pm & 7pm.   mometasone 0.1 % cream Commonly known as: Elocon Apply 1 application topically 2 (two) times daily.   montelukast 10 MG tablet Commonly known as: SINGULAIR TAKE 1 TABLET(10 MG) BY MOUTH AT BEDTIME   multivitamin tablet Take 1 tablet by mouth daily. 9 am.   mycophenolate 250 MG capsule Commonly known as: CELLCEPT Take 500 mg by mouth 2 (two) times daily. 9 am & 9 pm.   Nebulizer/Tubing/Mouthpiece Kit 2 each by Does not apply route as directed.   Nebulizers Misc 1 kit by Does not apply route as directed.   omeprazole 40 MG capsule Commonly known as: PRILOSEC Take 1 capsule (40 mg total) by mouth 2 (two) times daily.   polyethylene glycol 17 g packet Commonly known as: MIRALAX / GLYCOLAX Take 17 g by mouth daily as needed (for constipation). In water or juice   prazosin 1 MG capsule Commonly known as: MINIPRESS Take 2 mg by mouth at bedtime.   predniSONE 10 MG tablet Commonly known as:  DELTASONE Take two tablets (66m) twice daily for three days, then one tablet (157m twice daily for three days, then STOP   RA Vitamin D-3 50 MCG (2000 UT) Caps Generic drug: Cholecalciferol Take 1 capsule by mouth daily.   Refresh Tears 0.5 % Soln Generic drug: carboxymethylcellulose Place 1 drop into both eyes 4 (four) times daily as needed (for dry eyes).   risperiDONE 3 MG tablet Commonly known as: RISPERDAL Take 3 mg by mouth at bedtime.   Spacer/Aero-Hold ChGannett Cose as directed with inhaler.       Past Medical History:  Diagnosis Date  . Abnormal vaginal bleeding   . Allergy   . Anemia   . Arthritis    right knee  . Asthma 09-23-12   Asthma somewhat causing increase wheezing and mild congestion at present  . [place]  . Constipation   .  DUB (dysfunctional uterine bleeding)   . Fall at home, initial encounter 01/21/2019  . GERD (gastroesophageal reflux disease)   . Health care maintenance 03/06/2014  . Intellectual disability   . Left-sided Bell's palsy 12/22/2018  . Liver transplant recipient [facility])   . Lumbar back pain 04/06/2014  . PONV (postoperative nausea and vomiting)   . 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 Productive cough 07/13/2019  . 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 Seizures ([place])    congenital epilepsy-hx. seizures  . Vitamin D deficiency     Past Surgical History:  Procedure Laterality Date  . adenonoidectomy    . APPENDECTOMY    . CHOLECYSTECTOMY    . COLONOSCOPY W/ BIOPSIES    . ESOPHAGOGASTRODUODENOSCOPY (EGD) WITH PROPOFOL N/A 10/01/2012   Procedure: ESOPHAGOGASTRODUODENOSCOPY (EGD) WITH PROPOFOL;  Surgeon: Arta Silence, MD;  Location: [facility];  Service: Endoscopy;  Laterality: N/A;  . eustacean tubes    . EYE MUSCLE SURGERY    . LIVER TRANSPLANT      Review of systems negative except as noted in HPI / PMHx or noted below:  Review of Systems  Constitutional: Negative.   HENT: Negative.    Eyes: Negative.   Respiratory: Negative.   Cardiovascular: Negative.   Gastrointestinal: Negative.   Genitourinary: Negative.   Musculoskeletal: Negative.   Skin: Negative.   Neurological: Negative.   Endo/Heme/Allergies: Negative.   Psychiatric/Behavioral: Negative.      Objective:   Vitals:   03/29/20 1138  BP: 118/78  Pulse: (!) 101  Resp: 20  SpO2: 98%          Physical Exam Constitutional:      Appearance: She is not diaphoretic.  HENT:     Head: Normocephalic.     Right Ear: Tympanic membrane, ear canal and external ear normal.     Left Ear: Tympanic membrane, ear canal and external ear normal.     Nose: Nose normal. No mucosal edema or rhinorrhea.     Mouth/Throat:     Pharynx: Uvula midline. No oropharyngeal exudate.  Eyes:     Conjunctiva/sclera: Conjunctivae normal.  Neck:     Thyroid: No thyromegaly.     Trachea: Trachea normal. No tracheal tenderness or tracheal deviation.  Cardiovascular:     Rate and Rhythm: Normal rate and regular rhythm.     Heart sounds: Normal heart sounds, S1 normal and S2 normal. No murmur heard.   Pulmonary:     Effort: No respiratory distress.     Breath sounds: Normal breath sounds. No stridor. No wheezing or rales.  Lymphadenopathy:     Head:     Right side of head: No tonsillar adenopathy.     Left side of head: No tonsillar adenopathy.     Cervical: No cervical adenopathy.  Skin:    Findings: No erythema or rash.     Nails: There is no clubbing.  Neurological:     Mental Status: She is alert.     Diagnostics:    Spirometry was performed and demonstrated an FEV1 of 1.80 at 90 % of predicted.  Assessment and Plan:   1. Not well controlled moderate persistent asthma   2. Other allergic rhinitis   3. LPRD (laryngopharyngeal reflux disease)   4. Inflammatory dermatosis   5. Immunosuppression ([place])     1.  Continue Symbicort 160 2 inhalations twice a day with spacer / mask  2. Start Spriva 1.25 Respimat  - 2 inhalations 1 time per day  3. Start  prednisone 10 mg - 1 tablet 1 time per day for 10 days only  4. Continue montelukast 10 mg one tablet once a day  5. Continue nasal Azelastine and fluticasone 1 spray each nostril twice a day  6. Continue cetirizine 10 mg one tablet once a day  7. Continue ProAir HFA 2 puffs every 4-6 hours if needed  8. Continue Mometasone 0.1% ointment 1-2 times a day if needed   9. Continue Pantoprazole 40 mg 2 times per day   10. Return to clinic in 4 weeks or earlier if problem  It appears that Princess has some inflammation of her lower airway and we will start her on an anticholinergic agent and give her a very low dose of systemic steroid for the next 10 days as noted above.  Because we did change her therapy I would like to see her back in this clinic in 4 weeks to make sure that her current plan is adequate in controlling her respiratory tract inflammation.  She will continue on all other anti-inflammatory agents for airway and therapy directed against reflux as noted above.  I will see her back in this clinic in 4 weeks.  Allena Katz, MD Allergy / Immunology [place]

## 2020-03-29 NOTE — Patient Instructions (Addendum)
  1.  Continue Symbicort 160 2 inhalations twice a day with spacer / mask  2. Start Spriva 1.25 Respimat - 2 inhalations 1 time per day  3. Start prednisone 10 mg - 1 tablet 1 time per day for 10 days only  4. Continue montelukast 10 mg one tablet once a day  5. Continue nasal Azelastine and fluticasone 1 spray each nostril twice a day  6. Continue cetirizine 10 mg one tablet once a day  7. Continue ProAir HFA 2 puffs every 4-6 hours if needed  8. Continue Mometasone 0.1% ointment 1-2 times a day if needed   9. Continue Pantoprazole 40 mg 2 times per day   10. Return to clinic in 4 weeks or earlier if problem

## 2020-03-29 NOTE — Telephone Encounter (Signed)
Spoke to patient who states that medicaid will not pay for the blood pressure cuff because it is an over the counter item.  She states that the pharmacy informed her that it would cost $40.92 and she can not afford this.  Patient wants to know if there is anything else she can do?  [name], [place]

## 2020-03-30 ENCOUNTER — Encounter: Payer: Self-pay | Admitting: Allergy and Immunology

## 2020-03-30 NOTE — Telephone Encounter (Signed)
She can always try online, she should be able to find a reasonable blood pressure cuff for around 25-$30.  Traci, are you familiar with any additional resources or preferred method to receive Medicaid coverage for your blood pressure cuff without an official diagnosis of hypertension?   [name], DO

## 2020-03-31 ENCOUNTER — Other Ambulatory Visit: Payer: Self-pay | Admitting: Allergy and Immunology

## 2020-03-31 ENCOUNTER — Other Ambulatory Visit: Payer: Self-pay | Admitting: Family Medicine

## 2020-03-31 DIAGNOSIS — R011 Cardiac murmur, unspecified: Secondary | ICD-10-CM

## 2020-03-31 MED ORDER — BLOOD PRESSURE MONITOR AUTOMAT DEVI: 0 refills | Status: DC

## 2020-03-31 NOTE — Progress Notes (Signed)
Please let patient know that I have sent a blood pressure cuff to [facility] at [facility], which should hopefully be able to cover this through her Medicaid.  Thank you, [name], DO

## 2020-04-01 ENCOUNTER — Other Ambulatory Visit: Payer: Self-pay

## 2020-04-01 ENCOUNTER — Encounter: Payer: Self-pay | Admitting: Podiatry

## 2020-04-01 ENCOUNTER — Ambulatory Visit (INDEPENDENT_AMBULATORY_CARE_PROVIDER_SITE_OTHER): Payer: Medicaid Other | Admitting: Podiatry

## 2020-04-01 ENCOUNTER — Telehealth: Payer: Self-pay

## 2020-04-01 DIAGNOSIS — B351 Tinea unguium: Secondary | ICD-10-CM | POA: Insufficient documentation

## 2020-04-01 DIAGNOSIS — M79674 Pain in right toe(s): Secondary | ICD-10-CM

## 2020-04-01 DIAGNOSIS — M79675 Pain in left toe(s): Secondary | ICD-10-CM

## 2020-04-01 NOTE — Progress Notes (Signed)
This patient returns to the office for evaluation and treatment of long thick painful nails .  This patient is unable to trim his own nails since the patient cannot reach his feet.  Patient says the nails are painful walking and wearing his shoes.  He returns for preventive foot care services.    General Appearance  Alert, conversant and in no acute stress.  Vascular  Dorsalis pedis and posterior tibial  pulses are palpable  bilaterally.  Capillary return is within normal limits  bilaterally. Temperature is within normal limits  bilaterally.  Neurologic  Senn-Weinstein monofilament wire test within normal limits  bilaterally. Muscle power within normal limits bilaterally.  Nails Thick disfigured discolored nails with subungual debris  from hallux to fifth toes bilaterally. No evidence of bacterial infection or drainage bilaterally.  Orthopedic  No limitations of motion  feet .  No crepitus or effusions noted.  No bony pathology or digital deformities noted.  Skin  normotropic skin with no porokeratosis noted bilaterally.  No signs of infections or ulcers noted.     Onychomycosis  Pain in toes right foot  Pain in toes left foot  Debridement  of nails  1-5  B/L with a nail nipper.  Nails were then filed using a dremel tool with no incidents.    RTC 4  months    Makenzye Troutman DPM  

## 2020-04-01 NOTE — Telephone Encounter (Signed)
Patient returns call to nurse line. Informed that BP cuff has been sent to [facility]. Provided patient with phone number to pharmacy.   No further questions.   Talbot Grumbling, RN

## 2020-04-01 NOTE — Telephone Encounter (Signed)
Called and LVM for patient concerning Blood pressure cuff.   Ozella Almond, [place]  .

## 2020-04-05 ENCOUNTER — Encounter: Payer: Self-pay | Admitting: Family Medicine

## 2020-04-05 ENCOUNTER — Ambulatory Visit (INDEPENDENT_AMBULATORY_CARE_PROVIDER_SITE_OTHER): Payer: Medicaid Other | Admitting: Family Medicine

## 2020-04-05 ENCOUNTER — Other Ambulatory Visit: Payer: Self-pay

## 2020-04-05 VITALS — BP 130/80 | HR 88 | Wt 219.8 lb

## 2020-04-05 DIAGNOSIS — Z944 Liver transplant status: Secondary | ICD-10-CM

## 2020-04-05 DIAGNOSIS — L602 Onychogryphosis: Secondary | ICD-10-CM | POA: Diagnosis not present

## 2020-04-05 DIAGNOSIS — K743 Primary biliary cirrhosis: Secondary | ICD-10-CM | POA: Diagnosis not present

## 2020-04-05 DIAGNOSIS — E038 Other specified hypothyroidism: Secondary | ICD-10-CM

## 2020-04-05 MED ORDER — MYCOPHENOLATE MOFETIL 250 MG PO CAPS: 250.0000 mg | ORAL_CAPSULE | Freq: Two times a day (BID) | ORAL | 0 refills | Status: DC

## 2020-04-05 NOTE — Assessment & Plan Note (Signed)
TSH WNL in 07/2019, appears asymptomatic.  Discussed rechecking TSH in 07/2020, however due to patient concern will go ahead and check sooner.  Getting labs drawn for her hepatologist tomorrow via her home health RN, added on TSH after discussion with home health agency.  Will follow up results, continue Synthroid 50 mcg as is for now.

## 2020-04-05 NOTE — Progress Notes (Signed)
    SUBJECTIVE:   CHIEF COMPLAINT / HPI: F/u thyroid   [name] is a 44 year old female presenting for follow-up.  She reports she is doing very well, looking forward to her birthday.  Hoping to cook a [place] feast.  She recently was seen by her allergy and asthma specialist, started on Spiriva and prednisone course.  Reports her breathing is much better, less coughing.  She would like her thyroid rechecked.  Currently on low-dose Synthroid for subclinical hypothyroidism.  Last checked and normal in 07/2019.  She gets labs drawn regularly for her liver specialist at home via RN.  She denies any difficulty with constipation, diarrhea, GI upset, temperature fluctuation, fatigue, weight gain, dry skin.  Also states that her hepatologist decreased her mycophenolate to one 250 mg pill twice daily.  Tolerating this well.  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  Discussed rechecking TSH in 07/2020, however due to patient concern will go ahead and check sooner.  Getting labs drawn for her hepatologist tomorrow via her home health RN, added on TSH after discussion with home health agency.  Will follow up results, continue Synthroid 50 mcg as is for now.  Asthma with allergic rhinitis Improved symptomatology after initiation of Spiriva and current steroid course.  Follows with allergy and immunology.  Liver transplant recipient [name]) Decreased dose of  mycophenolate, updated medication list.    Follow-up in approximately 3-4 months for above or sooner if needed.  [name], [place]

## 2020-04-05 NOTE — Assessment & Plan Note (Signed)
Decreased dose of mycophenolate, updated medication list.

## 2020-04-05 NOTE — Patient Instructions (Signed)
Wonderful to see you today! I will touch base with your nurse about getting TSH (thyroid) checked tomorrow. If not--you can come back here or we'll do a different time.

## 2020-04-05 NOTE — Assessment & Plan Note (Signed)
Improved symptomatology after initiation of Spiriva and current steroid course.  Follows with allergy and immunology.

## 2020-04-13 ENCOUNTER — Other Ambulatory Visit: Payer: Self-pay | Admitting: Allergy and Immunology

## 2020-04-20 ENCOUNTER — Telehealth: Payer: Self-pay

## 2020-04-20 NOTE — Telephone Encounter (Signed)
Patient LVM on nurse line requesting thyroid lab results. Per OV note, patient was to be having TSH levels drawn by home health agency. Patient states that she has not heard anything back regarding results.   Please advise if you have received these results. Patient is requesting returned phone call to [phone]  Talbot Grumbling, RN

## 2020-04-24 NOTE — Telephone Encounter (Signed)
I was waiting on a fax with TSH results, however did not receive.  Reviewed care everywhere which looks like she did not end up having a TSH performed.  Unfortunately, to avoid further difficulty it may be best to have her make a lab visit within our clinic at her convenience to obtain a TSH level.   [name], DO

## 2020-04-25 NOTE — Telephone Encounter (Signed)
Called patient to inquire as to if she had gotten TSH labs drawn at home or to make Lab appointment at [facility].  Patient states that the labs were drawn twice. Once on 03/30/2020 and on 03/31/2020.     The first specimen was left out too long so that is why test had to be repeated.  Patient will call and have the results faxed to [facility].  Fax number was given to patient.  [name], [place]

## 2020-04-27 ENCOUNTER — Other Ambulatory Visit: Payer: Self-pay | Admitting: Family Medicine

## 2020-04-27 DIAGNOSIS — E038 Other specified hypothyroidism: Secondary | ICD-10-CM

## 2020-04-28 NOTE — Telephone Encounter (Signed)
Michelle from [place] at home called.  They ran into some issues with processing the TSH.  They will go out on 05/04/20 and draw again.  Christen Bame, CMA

## 2020-05-04 NOTE — Telephone Encounter (Signed)
Patient called to let doctor know that the nurse came out today and took some more blood, because something happened with processing the last set of blood work. She states that the nurse took it to [place] and that the doctor should have the results sometime next week. Thanks

## 2020-05-10 ENCOUNTER — Ambulatory Visit: Payer: Medicaid Other | Admitting: Allergy and Immunology

## 2020-05-11 ENCOUNTER — Other Ambulatory Visit: Payer: Self-pay | Admitting: Allergy and Immunology

## 2020-05-16 ENCOUNTER — Telehealth: Payer: Self-pay | Admitting: Family Medicine

## 2020-05-16 NOTE — Telephone Encounter (Signed)
Called patient to discuss thyroid results. Reviewed via fax and TSH normal (awaiting paper to be scanned into chart in media).   Continue synthroid dose as is. Will recheck TSH in 1 year or sooner if any change in presentation. Pt endorsed understanding.   [name], DO

## 2020-05-18 ENCOUNTER — Telehealth: Payer: Self-pay | Admitting: Neurology

## 2020-05-18 ENCOUNTER — Other Ambulatory Visit: Payer: Self-pay | Admitting: Neurology

## 2020-05-18 MED ORDER — DIVALPROEX SODIUM ER 500 MG PO TB24: 1000.0000 mg | ORAL_TABLET | Freq: Every day | ORAL | 0 refills | Status: DC

## 2020-05-18 NOTE — Telephone Encounter (Signed)
Pt is needing a refill on her divalproex (DEPAKOTE ER) 500 MG 24 hr tablet sent in to the [facility] on [address].

## 2020-05-18 NOTE — Telephone Encounter (Signed)
Refill sent for the pt for a month supply for the pt until her scheduled apt in January

## 2020-05-24 ENCOUNTER — Ambulatory Visit (INDEPENDENT_AMBULATORY_CARE_PROVIDER_SITE_OTHER): Payer: Medicaid Other | Admitting: Allergy and Immunology

## 2020-05-24 ENCOUNTER — Other Ambulatory Visit: Payer: Self-pay

## 2020-05-24 ENCOUNTER — Encounter: Payer: Self-pay | Admitting: Allergy and Immunology

## 2020-05-24 VITALS — BP 112/78 | HR 102 | Resp 18

## 2020-05-24 DIAGNOSIS — J3089 Other allergic rhinitis: Secondary | ICD-10-CM

## 2020-05-24 DIAGNOSIS — J4540 Moderate persistent asthma, uncomplicated: Secondary | ICD-10-CM

## 2020-05-24 DIAGNOSIS — L989 Disorder of the skin and subcutaneous tissue, unspecified: Secondary | ICD-10-CM

## 2020-05-24 DIAGNOSIS — K219 Gastro-esophageal reflux disease without esophagitis: Secondary | ICD-10-CM

## 2020-05-24 NOTE — Progress Notes (Signed)
[place]   Follow-up Note  Referring Provider: [name], DO Primary Provider: [name], DO Date of Office Visit: 05/24/2020  Subjective:   [name] (DOB: [birth year]) is a 44 y.o. 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  Her last visit to this clinic was 29 March 2020.  During her last visit she was having a fair amount of problems with coughing and wheezing and we treated her with a very short course of systemic steroids and had her add an inhaled anticholinergic agent to her combination inhaler.  That treatment worked quite well although she has noticed that over the course of the past week or so she has had a little bit more cough without any upper airway symptoms and without any symptoms suggesting that reflux is active.  She has not had any phlegm production and she is not had any fever or associated systemic or constitutional symptoms.  Her reflux has been under very good control.  She has not had any problems with dermatitis and has not had to use any topical steroid.  She has received 3 Moderna Covid vaccinations and the flu vaccine this year.  She continues on immunosuppression with CellCept and cyclosporine.  Allergies as of 05/24/2020      Reactions   Tramadol Hcl Itching, Nausea And Vomiting   Causes SEIZURES   Aspirin Nausea And Vomiting   Due to Stomach ulcer   Ibuprofen Other (See Comments)   Due to stomach ulcer   Penicillins Itching, Swelling, Rash   Breakout  Has patient had a PCN reaction causing immediate rash, facial/tongue/throat swelling, SOB or lightheadedness with hypotension: Yes Has patient had a  PCN reaction causing severe rash involving mucus membranes or skin necrosis: Yes Has patient had a PCN reaction that required hospitalization: Yes Has patient had a PCN reaction occurring within the last 10 years: No If all of the above answers are "NO", then may proceed with Cephalosporin use.     Pseudoephedrine Itching, Swelling   Tongue swelling   Sulfa Antibiotics Nausea And Vomiting   Latex Itching, Rash      Medication List      Accu-Chek Guide test strip Generic drug: glucose blood USE TO CHECK BLOOD SUGAR EVERY DAY   Accu-Chek Guide w/Device Kit 1 each by Does not apply route daily. Use to check blood sugar once daily or as directed.   Accu-Chek Softclix Lancets lancets Use to check blood sugar once daily in the morning prior to eating.   albuterol (2.5 MG/3ML) 0.083% nebulizer solution Commonly known as: PROVENTIL 1 vial via nebulizer every 4-6 hours as needed.   ProAir HFA 108 (90 Base) MCG/ACT inhaler Generic drug: albuterol Inhale 1-2 puffs into the lungs every 4 (four) hours as needed for shortness of breath.   azelastine 0.1 % nasal spray Commonly known as: ASTELIN Place 2 sprays into both nostrils 2 (two) times daily.   benztropine 0.5 MG tablet Commonly known as: COGENTIN Take 0.5 mg by mouth at bedtime.   Blood Pressure Monitor Automat Devi Please use to check blood pressure daily/ as needed.   budesonide 0.5 MG/2ML nebulizer solution Commonly known as: PULMICORT Take 2 mLs (0.5 mg total)  by nebulization 2 (two) times a day.   budesonide-formoterol 160-4.5 MCG/ACT inhaler Commonly known as: SYMBICORT Inhale 2 puffs into the lungs 2 (two) times daily. Use with spacer and mask.   cetirizine 10 MG tablet Commonly known as: ZYRTEC TAKE 1 TABLET(10 MG) BY MOUTH DAILY   cycloSPORINE modified 100 MG Caps Take 100-125 mg by mouth 2 (two) times daily. Take 100 mg in the AM and 125 mg in the PM   divalproex 500 MG 24 hr tablet Commonly known as:  DEPAKOTE ER Take 2 tablets (1,000 mg total) by mouth at bedtime.   doxepin 25 MG capsule Commonly known as: SINEQUAN TK 1 C PO HS   doxycycline 100 MG capsule Commonly known as: MONODOX Take 1 capsule (100 mg total) by mouth 2 (two) times daily.   famotidine 40 MG tablet Commonly known as: PEPCID Take 1 tablet (40 mg total) by mouth every evening.   ferrous sulfate 325 (65 FE) MG EC tablet Take 1 tablet by mouth daily.   FIBER (GUAR GUM) PO Take by mouth.   fluticasone 50 MCG/ACT nasal spray Commonly known as: FLONASE Place 1 spray into both nostrils 2 (two) times daily.   hydrocortisone valerate cream 0.2 % Commonly known as: WESTCORT APPLY 1 APPLICATION TOPICALLY TO AFFECTED AREAS ON HANDS AND FEET BID PRN FOR DERMATITIS   Lac-Hydrin 12 % cream Generic drug: ammonium lactate Apply topically 2 (two) times daily.   levothyroxine 50 MCG tablet Commonly known as: SYNTHROID TAKE 1 TABLET(50 MCG) BY MOUTH EVERY MORNING 30 MINUTES BEFORE FOOD   MAGnesium-Oxide 400 (241.3 Mg) MG tablet Generic drug: magnesium oxide Take 1 tablet by mouth 2 (two) times daily.   mometasone 0.1 % cream Commonly known as: ELOCON APPLY EXTERNALLY TO THE AFFECTED AREA TWICE DAILY   montelukast 10 MG tablet Commonly known as: SINGULAIR TAKE 1 TABLET(10 MG) BY MOUTH AT BEDTIME   multivitamin tablet Take 1 tablet by mouth daily. 9 am.   mycophenolate 250 MG capsule Commonly known as: CELLCEPT Take 1 capsule (250 mg total) by mouth 2 (two) times daily. One tablet daily   Nebulizer/Tubing/Mouthpiece Kit 2 each by Does not apply route as directed.   Nebulizers Misc 1 kit by Does not apply route as directed.   omeprazole 40 MG capsule Commonly known as: PRILOSEC Take 1 capsule (40 mg total) by mouth 2 (two) times daily.   pantoprazole 40 MG tablet Commonly known as: PROTONIX Take 40 mg by mouth 2 (two) times daily.   polyethylene glycol 17 g packet Commonly known as: MIRALAX /  GLYCOLAX Take 17 g by mouth daily as needed (for constipation). In water or juice   prazosin 1 MG capsule Commonly known as: MINIPRESS Take 2 mg by mouth at bedtime.   predniSONE 10 MG tablet Commonly known as: DELTASONE Take two tablets (8m) twice daily for three days, then one tablet (141m twice daily for three days, then STOP   RA Vitamin D-3 50 MCG (2000 UT) Caps Generic drug: Cholecalciferol Take 1 capsule by mouth daily.   Refresh Tears 0.5 % Soln Generic drug: carboxymethylcellulose Place 1 drop into both eyes 4 (four) times daily as needed (for dry eyes).   risperiDONE 3 MG tablet Commonly known as: RISPERDAL Take 3 mg by mouth at bedtime.   Spacer/Aero-Hold ChGannett Cose as directed with inhaler.   Spiriva Respimat 1.25 MCG/ACT Aers Generic drug: Tiotropium Bromide Monohydrate Inhale 2 puffs into the lungs daily.  Past Medical History:  Diagnosis Date  . Abnormal vaginal bleeding   . Allergy   . Anemia   . Arthritis    right knee  . Asthma 09-23-12   Asthma somewhat causing increase wheezing and mild congestion at present  . Biliary cirrhosis ([place]) 04/26/11  . Constipation   . DUB (dysfunctional uterine bleeding)   . Fall at home, initial encounter 01/21/2019  . GERD (gastroesophageal reflux disease)   . Health care maintenance 03/06/2014  . Intellectual disability   . Left-sided Bell's palsy 12/22/2018  . Liver transplant recipient [facility])   . Lumbar back pain 04/06/2014  . PONV (postoperative nausea and vomiting)   . 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 Productive cough 07/13/2019  . 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 Seizures ([place])    congenital epilepsy-hx. seizures  . Vitamin D deficiency     Past Surgical History:  Procedure Laterality Date  . adenonoidectomy    . APPENDECTOMY    . CHOLECYSTECTOMY    . COLONOSCOPY W/ BIOPSIES    . ESOPHAGOGASTRODUODENOSCOPY (EGD) WITH PROPOFOL N/A 10/01/2012   Procedure:  ESOPHAGOGASTRODUODENOSCOPY (EGD) WITH PROPOFOL;  Surgeon: Arta Silence, MD;  Location: [facility] END[facility];  Service: Endoscopy;  Laterality: N/A;  . eustacean tubes    . EYE MUSCLE SURGERY    . LIVER TRANSPLANT      Review of systems negative except as noted in HPI / PMHx or noted below:  Review of Systems  Constitutional: Negative.   HENT: Negative.   Eyes: Negative.   Respiratory: Negative.   Cardiovascular: Negative.   Gastrointestinal: Negative.   Genitourinary: Negative.   Musculoskeletal: Negative.   Skin: Negative.   Neurological: Negative.   Endo/Heme/Allergies: Negative.   Psychiatric/Behavioral: Negative.      Objective:   Vitals:   05/24/20 1157  BP: 112/78  Pulse: (!) 102  Resp: 18  SpO2: 96%          Physical Exam Constitutional:      Appearance: She is not diaphoretic.  HENT:     Head: Normocephalic.     Right Ear: Tympanic membrane, ear canal and external ear normal.     Left Ear: Tympanic membrane, ear canal and external ear normal.     Nose: Nose normal. No mucosal edema or rhinorrhea.     Mouth/Throat:     Mouth: Oropharynx is clear and moist and mucous membranes are normal.     Pharynx: Uvula midline. No oropharyngeal exudate.  Eyes:     Conjunctiva/sclera: Conjunctivae normal.  Neck:     Thyroid: No thyromegaly.     Trachea: Trachea normal. No tracheal tenderness or tracheal deviation.  Cardiovascular:     Rate and Rhythm: Normal rate and regular rhythm.     Heart sounds: Normal heart sounds, S1 normal and S2 normal. No murmur heard.   Pulmonary:     Effort: No respiratory distress.     Breath sounds: Normal breath sounds. No stridor. No wheezing or rales.  Musculoskeletal:        General: No edema.  Lymphadenopathy:     Head:     Right side of head: No tonsillar adenopathy.     Left side of head: No tonsillar adenopathy.     Cervical: No cervical adenopathy.  Skin:    Findings: No erythema or rash.     Nails: There is no clubbing.   Neurological:     Mental Status: She is alert.     Diagnostics:    Spirometry  was performed and demonstrated an FEV1 of 1.84 at 92 % of predicted.  Assessment and Plan:   1. Not well controlled moderate persistent asthma   2. Other allergic rhinitis   3. LPRD (laryngopharyngeal reflux disease)   4. Inflammatory dermatosis     1.  Continue Symbicort 160 2 inhalations twice a day with spacer / mask  2.  Continue Spriva 1.25 Respimat - 2 inhalations 1 time per day  3.  Continue montelukast 10 mg one tablet once a day  5. Continue nasal Azelastine and fluticasone 1 spray each nostril twice a day  6. Continue cetirizine 10 mg one tablet once a day  7. Continue Pantoprazole 40 mg 2 times per day  8. Continue ProAir HFA 2 inhalations of albuterol nebulizer every 4-6 hours if needed  9. Continue Mometasone 0.1% ointment 1-2 times a day if needed   10. Return to clinic in 12 weeks or earlier if problem  Overall Koryn has done pretty well and we are going to keep her on a combination of inhaler agents including an inhaled steroid, long-acting bronchodilator, and anticholinergic agent as well as a leukotriene modifier.  She will also remain on therapy for her reflux as noted above.  She has several other medication she can use as needed.  I will see her back in this clinic in 12 weeks or earlier if there is a problem.  Allena Katz, MD Allergy / Immunology [place]

## 2020-05-24 NOTE — Patient Instructions (Signed)
  1.  Continue Symbicort 160 2 inhalations twice a day with spacer / mask  2.  Continue Spriva 1.25 Respimat - 2 inhalations 1 time per day  3.  Continue montelukast 10 mg one tablet once a day  5. Continue nasal Azelastine and fluticasone 1 spray each nostril twice a day  6. Continue cetirizine 10 mg one tablet once a day  7. Continue Pantoprazole 40 mg 2 times per day  8. Continue ProAir HFA 2 inhalations of albuterol nebulizer every 4-6 hours if needed  9. Continue Mometasone 0.1% ointment 1-2 times a day if needed   10. Return to clinic in 12 weeks or earlier if problem

## 2020-05-25 ENCOUNTER — Encounter: Payer: Self-pay | Admitting: Allergy and Immunology

## 2020-06-06 ENCOUNTER — Other Ambulatory Visit: Payer: Self-pay

## 2020-06-06 ENCOUNTER — Encounter: Payer: Self-pay | Admitting: Neurology

## 2020-06-06 ENCOUNTER — Ambulatory Visit: Payer: Medicaid Other | Admitting: Neurology

## 2020-06-06 VITALS — BP 122/83 | HR 105 | Ht 62.0 in | Wt 225.0 lb

## 2020-06-06 DIAGNOSIS — Z944 Liver transplant status: Secondary | ICD-10-CM | POA: Diagnosis not present

## 2020-06-06 DIAGNOSIS — G40909 Epilepsy, unspecified, not intractable, without status epilepticus: Secondary | ICD-10-CM

## 2020-06-06 MED ORDER — DIVALPROEX SODIUM ER 500 MG PO TB24: 1000.0000 mg | ORAL_TABLET | Freq: Every day | ORAL | 3 refills | Status: DC

## 2020-06-06 NOTE — Progress Notes (Signed)
PATIENT: [name] DOB: May 25, 1976  REASON FOR VISIT: follow up HISTORY FROM: patient  HISTORY OF PRESENT ILLNESS: Today 06/06/20  HISTORY HISTORY 03/23/14 [name]): Ms. Mendosa, 45 year old black female returns today for followup of epilepsy. her uncle is Lauraine Crespo, He is the power of attorney for [name]. She has a history of mild mental retardation, and long-standing history of seizures. She was well-controlled on Depakote and Tegretol extended release. But she has developed liver disease, was diagnosed with primary biliary cirrhosis,Antieptical medication was changed since February 2013, she is now taking Vimpat 100 mg 2 tabs twice a day, Keppra 500 mg 3 tabs daily. She has no significant seizure activities while taking Vimpat, and Keppra.She lives alone, uncles lives next door, she attends adult daycare regularly.She underwent liver transplantation in February 2015, doing very well, on polypharmacy treatment, this including CellCept 1000 mg twice a day, Prograft 4 mg twice a day,For her seizure, she is now taking deapkote ER 272m 5 tabs qhs, she is having seizure about once amonth, it usually happens at nighttime, she was noted to have nighttime extremity jumping movement, tongue biting when she woke up, she also complains of mild gait difficullty,   UPDATE Oct 27th 2015:I have talked with [facility] transplant cordinator, reported, 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, [place] [facility], Vimpat cause worsening behavior issue, she had a hallucinations, paranoid to the point of almost institutionalized, her mood disorder eventually was under control with Depakote ER 250 mg 5 tablets each day, [name] continues to complains that she had a 2 episode of waking up with blood in her mouth, has bitten her tongue, one in July, the second one was September 2015, overall she is tolerating the medication well, Depakote level was 63 in February 23 2014, EEG was normal.Repeat laboratory  evaluation in March 09 2014, showed decreased white count 1.6, she is receiving weekly laboratory evaluation by [name] transplant team, I have called coordinator again, is aware of her leukopenia, she is on lower dose of immunosuppressive treatment, now taking cyclosporine 200 mg twice a day, CellCept 250 mg twice a day,She has personal aid every evening  UPDATE December 08 2015:yy She is brought in by [name] today, I reviewed and summarized her to visit in January 2017, "Patient's current immunosuppression regimen is Cyclosporine 100/125mand Mycophenolate 500MG BID". She has no clinical features concerning for complications of immunosuppressive drug therapy,  She is now taking Depakote DR 25039m tabs qhs, she reported one episode of left hand shaking in July 2017.   Laboratory evaluation in January 2017, Depakote level 78, CBC showed mild anemia, hemoglobin 9.6, normal CMP, Update 01/16/2018CM. [name] 23ar old female returns for follow-up for seizure disorder. She has a history of liver transplant in 2015. She is currently on Depakote extended release 500 mg 2 at bedtime. She reports no seizure activity in several years. She continues to live alone, uncle lives next door. She has no new neurologic complaints. [facility]. She has not had a recent VPA level. She returns for reevaluation. UPDATE 07/09/2018CM [name], [age]ar old female returns for follow-up for her history of seizure disorder. She has not had any seizure activity in several years . [facility]. She remains on Depakote 500 mg 2 tablets at bedtime. She continues to live alone family member last next door. [facility]  [facility]. She had recent CBC on 10/30/16 which is stable. She is currently being evaluated for chest pain by Dr. Tamala Julian , she will have a myocardial perfusion test on Tuesday .  She returns for reevaluation UPDATE7/23/2019CMMs.Woodward,45 year old female returns for follow-up with history of seizure disorder. She has not had any seizures in several years, at least 3. She has a history of liver transplant at [facility] in 2015. She is currently on Depakote 500 mg 2 tablets at bedtime. Reviewed recent CBC and CMP from 11/19/2017. Patient continues to live alone however a relative lives in the right next door. She has had no interval medical issues she claims. She returns for reevaluation Update 10/01/2018 SS: [name] is a 45 year old female with history of seizure disorder.  She is currently taking Depakote ER 500 mg 2 tablets at bedtime.  She currently lives alone, her uncle lives next door.  She reports that she has been doing well, she denies any recent seizures.  In 2015 she did have a liver transplant.  She does have a nurse who comes out to help with her medications, fills her medication box.  She reports during the day she likes working on her laptop, finding recipes for cooking.  She reports regular follow-up with her primary care doctor.  She reports she has been walking well, she denies any falls.  She says her health has been doing well, her uncle voiced the same.  She reports she does not work and she does not drive a car.  She is not sure of all of her medications, [place] manages these.   Lab work done at her primary care office 09/26/2018 hepatic function panel within normal limits, hemoglobin was low at 10.3 (chronic anemia), TSH was normal  She did see her primary care doctor 09/26/2018 for congestion, nasal drip, they noted a tremor in her upper extremities, could be related to Depakote her TSH. She denies any tremor, reports her psychiatrist started her on medication and has been beneficial  Update June 06, 2020 SS: Here today for follow-up unaccompanied, continues to live alone, but her uncle lives next door. Has [place] from [place] who comes once a week to  fix her pill box.  No recurrent seizure.  Has routine follow-up with [name], status post liver transplant in 2015.  Working with a Engineer, maintenance (IT), weight has increased over holidays her aunt was staying with her, eating 4 meals a day.  Took the [employer] bus here today.  Follows with psychiatry, on Cogentin, doxepin, Risperdal. Mild tremor both hands with finger nose finger.  Remains on Depakote ER 500 mg, 2 tablets at bedtime for seizure prevention.  Has no complaints today, routine follow-up.  REVIEW OF SYSTEMS: Out of a complete 14 system review of symptoms, the patient complains only of the following symptoms, and all other reviewed systems are negative.  n/a  ALLERGIES: Allergies  Allergen Reactions  . Tramadol Hcl Itching and Nausea And Vomiting    Causes SEIZURES  . Aspirin Nausea And Vomiting    Due to Stomach ulcer  . Ibuprofen Other (See Comments)    Due to stomach ulcer  . Penicillins Itching, Swelling and Rash    Breakout  Has patient had a PCN reaction causing immediate rash, facial/tongue/throat swelling, SOB or lightheadedness with hypotension: Yes Has patient had a PCN reaction causing severe rash involving mucus membranes or skin necrosis: Yes Has patient had a PCN reaction that required hospitalization: Yes Has patient had a PCN reaction  occurring within the last 10 years: No If all of the above answers are "NO", then may proceed with Cephalosporin use.     . Pseudoephedrine Itching and Swelling    Tongue swelling  . Sulfa Antibiotics Nausea And Vomiting  . Latex Itching and Rash    HOME MEDICATIONS: Outpatient Medications Prior to Visit  Medication Sig Dispense Refill  . ACCU-CHEK GUIDE test strip USE TO CHECK BLOOD SUGAR EVERY DAY 50 strip 10  . ACCU-CHEK SOFTCLIX LANCETS lancets Use to check blood sugar once daily in the morning prior to eating. 100 each 2  . albuterol (PROVENTIL) (2.5 MG/3ML) 0.083% nebulizer solution 1 vial via nebulizer every 4-6 hours as needed. 75  mL 1  . ammonium lactate (AMLACTIN) 12 % cream Apply topically 2 (two) times daily.    Marland Kitchen azelastine (ASTELIN) 0.1 % nasal spray Place 2 sprays into both nostrils 2 (two) times daily. 30 mL 5  . benztropine (COGENTIN) 0.5 MG tablet Take 0.5 mg by mouth at bedtime.    . Blood Glucose Monitoring Suppl (ACCU-CHEK GUIDE) w/Device KIT 1 each by Does not apply route daily. Use to check blood sugar once daily or as directed. 1 kit 0  . Blood Pressure Monitoring (BLOOD PRESSURE MONITOR AUTOMAT) DEVI Please use to check blood pressure daily/ as needed. 1 each 0  . budesonide (PULMICORT) 0.5 MG/2ML nebulizer solution Take 2 mLs (0.5 mg total) by nebulization 2 (two) times a day. 60 mL 5  . budesonide-formoterol (SYMBICORT) 160-4.5 MCG/ACT inhaler Inhale 2 puffs into the lungs 2 (two) times daily. Use with spacer and mask. 1 Inhaler 5  . carboxymethylcellulose (REFRESH PLUS) 0.5 % SOLN Place 1 drop into both eyes 4 (four) times daily as needed (for dry eyes).    . cetirizine (ZYRTEC) 10 MG tablet TAKE 1 TABLET(10 MG) BY MOUTH DAILY 30 tablet 5  . cycloSPORINE modified 100 MG CAPS Take 100-125 mg by mouth 2 (two) times daily. Take 100 mg in the AM and 125 mg in the PM    . doxepin (SINEQUAN) 25 MG capsule TK 1 C PO HS    . doxycycline (MONODOX) 100 MG capsule Take 1 capsule (100 mg total) by mouth 2 (two) times daily. 14 capsule 0  . famotidine (PEPCID) 40 MG tablet Take 1 tablet (40 mg total) by mouth every evening. 30 tablet 5  . ferrous sulfate 325 (65 FE) MG EC tablet Take 1 tablet by mouth daily.    Marland Kitchen FIBER, GUAR GUM, PO Take by mouth.    . fluticasone (FLONASE) 50 MCG/ACT nasal spray Place 1 spray into both nostrils 2 (two) times daily. 16 g 5  . hydrocortisone valerate cream (WESTCORT) 0.2 % APPLY 1 APPLICATION TOPICALLY TO AFFECTED AREAS ON HANDS AND FEET BID PRN FOR DERMATITIS  2  . levothyroxine (SYNTHROID) 50 MCG tablet TAKE 1 TABLET(50 MCG) BY MOUTH EVERY MORNING 30 MINUTES BEFORE FOOD 90 tablet 1   . MAGNESIUM-OXIDE 400 (241.3 Mg) MG tablet Take 1 tablet by mouth 2 (two) times daily.    . mometasone (ELOCON) 0.1 % cream APPLY EXTERNALLY TO THE AFFECTED AREA TWICE DAILY 45 g 5  . montelukast (SINGULAIR) 10 MG tablet TAKE 1 TABLET(10 MG) BY MOUTH AT BEDTIME 30 tablet 4  . Multiple Vitamin (MULTIVITAMIN) tablet Take 1 tablet by mouth daily. 9 am.    . mycophenolate (CELLCEPT) 250 MG capsule Take 1 capsule (250 mg total) by mouth 2 (two) times daily. One tablet daily 1 capsule 0  .  Nebulizers MISC 1 kit by Does not apply route as directed. 1 kit 1  . omeprazole (PRILOSEC) 40 MG capsule Take 1 capsule (40 mg total) by mouth 2 (two) times daily. 60 capsule 5  . pantoprazole (PROTONIX) 40 MG tablet Take 40 mg by mouth 2 (two) times daily.    . polyethylene glycol (MIRALAX / GLYCOLAX) packet Take 17 g by mouth daily as needed (for constipation). In water or juice    . prazosin (MINIPRESS) 1 MG capsule Take 2 mg by mouth at bedtime.     . predniSONE (DELTASONE) 10 MG tablet Take two tablets (55m) twice daily for three days, then one tablet (197m twice daily for three days, then STOP 18 tablet 0  . PROAIR HFA 108 (90 Base) MCG/ACT inhaler Inhale 1-2 puffs into the lungs every 4 (four) hours as needed for shortness of breath. 18 g 2  . RA VITAMIN D-3 2000 units CAPS Take 1 capsule by mouth daily.   0  . Respiratory Therapy Supplies (NEBULIZER/TUBING/MOUTHPIECE) KIT 2 each by Does not apply route as directed. 2 kit 5  . risperiDONE (RISPERDAL) 3 MG tablet Take 3 mg by mouth at bedtime.    . Marland Kitchenpacer/Aero-Hold Chamber Mask MISC Use as directed with inhaler. 1 each 1  . Tiotropium Bromide Monohydrate (SPIRIVA RESPIMAT) 1.25 MCG/ACT AERS Inhale 2 puffs into the lungs daily. 4 g 5  . divalproex (DEPAKOTE ER) 500 MG 24 hr tablet Take 2 tablets (1,000 mg total) by mouth at bedtime. 60 tablet 0   No facility-administered medications prior to visit.    PAST MEDICAL HISTORY: Past Medical History:   Diagnosis Date  . Abnormal vaginal bleeding   . Allergy   . Anemia   . Arthritis    right knee  . Asthma 09-23-12   Asthma somewhat causing increase wheezing and mild congestion at present  . Biliary cirrhosis ([facility]/12  . Constipation   . DUB (dysfunctional uterine bleeding)   . Fall at home, initial encounter 01/21/2019  . GERD (gastroesophageal reflux disease)   . Health care maintenance 03/06/2014  . Intellectual disability   . Left-sided Bell's palsy 12/22/2018  . [facility]  . Lumbar back pain 04/06/2014  . PONV (postoperative nausea and vomiting)   . [facility] cirrhosis ([facility]/02[facility]  . Productive cough 07/13/2019  . 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 Seizures ([facility]   congenital epilepsy-hx. seizures  . Vitamin D deficiency     PAST SURGICAL HISTORY: Past Surgical History:  Procedure Laterality Date  . adenonoidectomy    . APPENDECTOMY    . CHOLECYSTECTOMY    . COLONOSCOPY W/ BIOPSIES    . ESOPHAGOGASTRODUODENOSCOPY (EGD) WITH PROPOFOL N/A 10/01/2012   Procedure: ESOPHAGOGASTRODUODENOSCOPY (EGD) WITH PROPOFOL;  Surgeon: WiArta SilenceMD;  Location: [facility];  Service: Endoscopy;  Laterality: N/A;  . eustacean tubes    . EYE MUSCLE SURGERY    . LIVER TRANSPLANT      FAMILY HISTORY: Family History  Problem Relation Age of Onset  . Diabetes Mother   . Heart disease Mother   . COPD Mother   . Breast cancer Cousin   . Allergic rhinitis Neg Hx   . Angioedema Neg Hx   . Asthma Neg Hx   . Atopy Neg Hx   . Eczema Neg Hx   . Immunodeficiency Neg Hx   . Urticaria Neg Hx     SOCIAL HISTORY: Social History   Socioeconomic History  . Marital  status: Single    Spouse name: Not on file  . Number of children: 0  . Years of education: 47 th  . Highest education level: Not on file  Occupational History    Comment: Disabled  Tobacco Use  . Smoking status: Never Smoker  . Smokeless tobacco: Never Used  Vaping Use  . Vaping  Use: Never used  Substance and Sexual Activity  . Alcohol use: No    Alcohol/week: 0.0 standard drinks  . Drug use: No  . Sexual activity: Never  Other Topics Concern  . Not on file  Social History Narrative   Lives alone but uncle and his wife live next door and are her primary caregivers. Sonia Side (uncle) can be reached at [phone].    Education High school education.   Disabled.   Right handed.   Caffeine tea and coffee daily.         Current Social History   (Please include date ( . td) when updating information )      Who lives at home: lives  alone 07/05/2016    Transportation: uses Medicaid transportation to and from appointments 07/05/2016   Important Relationships & Pets: Lynnda Shields lives next door 07/05/2016    Current Stressors: Person Care aid will stop this month 07/05/2016   Work / Education:  Receives disability 07/05/2016   Religious / Personal Beliefs: has strong religious beliefs 07/05/2016   Interests / Fun: Enjoys going to bible study  07/05/2016   Other: enjoys reading her bible 07/05/2016                                                                                                         Social Determinants of Health   Financial Resource Strain: Not on file  Food Insecurity: Not on file  Transportation Needs: Not on file  Physical Activity: Not on file  Stress: Not on file  Social Connections: Not on file  Intimate Partner Violence: Not on file   PHYSICAL EXAM  Vitals:   06/06/20 0917  BP: 122/83  Pulse: (!) 105  Weight: 225 lb (102.1 kg)  Height: _0  (1.575 m)   Body mass index is 41.15 kg/m.  Generalized: Well developed, in no acute distress  Neurological examination  Mentation: Alert oriented to time, place, history taking. Follows all commands, cognitive disability, speech is clear, but slowed Cranial nerve II-XII: Pupils were equal round reactive to light. At rest, right extropia. Facial sensation and strength were normal. Head turning and  shoulder shrug  were normal and symmetric. Motor: The motor testing reveals 5 over 5 strength of all 4 extremities. Good symmetric motor tone is noted throughout.  Sensory: Sensory testing is intact to soft touch on all 4 extremities. No evidence of extinction is noted.  Coordination: Cerebellar testing reveals good finger-nose-finger and heel-to-shin bilaterally. Mild tremor bilaterally with finger-nose-finger. Gait and station: Gait is normal.  Reflexes: Deep tendon reflexes are symmetric but decreased throughout  DIAGNOSTIC DATA (LABS, IMAGING, TESTING) - I reviewed patient records, labs, notes, testing and imaging myself where  available.  Lab Results  Component Value Date   WBC 3.4 02/20/2019   HGB 9.1 (L) 02/20/2019   HCT 28.0 (L) 02/20/2019   MCV 87 02/20/2019   PLT 127 (L) 02/20/2019      Component Value Date/Time   NA 140 03/20/2019 1423   K 4.6 03/20/2019 1423   CL 105 03/20/2019 1423   CO2 21 03/20/2019 1423   GLUCOSE 93 03/20/2019 1423   GLUCOSE 100 (H) 12/14/2018 1413   BUN 24 03/20/2019 1423   CREATININE 1.24 (H) 03/20/2019 1423   CREATININE 0.83 06/28/2014 0932   CALCIUM 8.9 03/20/2019 1423   CALCIUM 9.0 09/11/2007 2301   PROT 7.0 03/20/2019 1423   ALBUMIN 3.9 03/20/2019 1423   AST 18 03/20/2019 1423   ALT 8 03/20/2019 1423   ALKPHOS 65 03/20/2019 1423   BILITOT 0.3 03/20/2019 1423   GFRNONAA 53 (L) 03/20/2019 1423   GFRNONAA >89 11/09/2011 1208   GFRAA 61 03/20/2019 1423   GFRAA >89 11/09/2011 1208   Lab Results  Component Value Date   CHOL 214 (H) 08/12/2019   HDL 49 08/12/2019   LDLCALC 129 (H) 08/12/2019   LDLDIRECT 64 07/10/2011   TRIG 201 (H) 08/12/2019   CHOLHDL 4.4 08/12/2019   Lab Results  Component Value Date   HGBA1C 5.1 04/28/2018   Lab Results  Component Value Date   VITAMINB12 616 08/13/2007   Lab Results  Component Value Date   TSH 1.680 08/12/2019      ASSESSMENT AND PLAN 45 y.o. year old female  has a past medical  history of Abnormal vaginal bleeding, Allergy, Anemia, Arthritis, Asthma (09-23-12), Biliary cirrhosis ([place]) (04/26/11), Constipation, DUB (dysfunctional uterine bleeding), Fall at home, initial encounter (01/21/2019), GERD (gastroesophageal reflux disease), Health care maintenance (03/06/2014), Intellectual disability, Left-sided Bell's palsy (12/22/2018), Liver transplant recipient [facility]), Lumbar back pain (04/06/2014), PONV (postoperative nausea and vomiting), Primary biliary cirrhosis ([place]) (05/07/2011), Productive cough (07/13/2019), Psychogenic tremor (02/21/2019), Seizures ([place]), and Vitamin D deficiency. here with:  1.  Seizure disorder 2.  Intellectual disability 3.  Status post liver transplant 2015 -Seizures remain well controlled -Continue Depakote ER 500 mg, 2 tablets daily -Check routine labs today -Continue routine follow-up with PCP, psychiatry, [name] status post transplant, also sees nutritionist -Follow-up in 1 year or sooner if needed  I spent 20 minutes of face-to-face and non-face-to-face time with patient.  This included previsit chart review, lab review, study review, order entry, electronic health record documentation, patient education.  Butler Denmark, AGNP-C, DNP 06/06/2020, 9:41 AM [facility] [address], [place], [place] [address] [phone]

## 2020-06-06 NOTE — Patient Instructions (Signed)
Check labs today  Continue Depakote  Call for seizures  See you back in 1 year

## 2020-06-07 LAB — COMPREHENSIVE METABOLIC PANEL
ALT: 19 IU/L (ref 0–32)
AST: 23 IU/L (ref 0–40)
Albumin/Globulin Ratio: 1.2 (ref 1.2–2.2)
Albumin: 3.9 g/dL (ref 3.8–4.8)
Alkaline Phosphatase: 74 IU/L (ref 44–121)
BUN/Creatinine Ratio: 21 (ref 9–23)
BUN: 29 mg/dL — ABNORMAL HIGH (ref 6–24)
Bilirubin Total: 0.5 mg/dL (ref 0.0–1.2)
CO2: 21 mmol/L (ref 20–29)
Calcium: 8.6 mg/dL — ABNORMAL LOW (ref 8.7–10.2)
Chloride: 105 mmol/L (ref 96–106)
Creatinine, Ser: 1.36 mg/dL — ABNORMAL HIGH (ref 0.57–1.00)
GFR calc Af Amer: 54 mL/min/{1.73_m2} — ABNORMAL LOW (ref >59)
GFR calc non Af Amer: 47 mL/min/{1.73_m2} — ABNORMAL LOW (ref >59)
Globulin, Total: 3.2 g/dL (ref 1.5–4.5)
Glucose: 107 mg/dL — ABNORMAL HIGH (ref 65–99)
Potassium: 3.9 mmol/L (ref 3.5–5.2)
Sodium: 140 mmol/L (ref 134–144)
Total Protein: 7.1 g/dL (ref 6.0–8.5)

## 2020-06-07 LAB — CBC WITH DIFFERENTIAL/PLATELET
Basophils Absolute: 0 10*3/uL (ref 0.0–0.2)
Basos: 1 %
EOS (ABSOLUTE): 0.1 10*3/uL (ref 0.0–0.4)
Eos: 3 %
Hematocrit: 30.5 % — ABNORMAL LOW (ref 34.0–46.6)
Hemoglobin: 10.4 g/dL — ABNORMAL LOW (ref 11.1–15.9)
Immature Grans (Abs): 0 10*3/uL (ref 0.0–0.1)
Immature Granulocytes: 1 %
Lymphocytes Absolute: 1.4 10*3/uL (ref 0.7–3.1)
Lymphs: 37 %
MCH: 30.4 pg (ref 26.6–33.0)
MCHC: 34.1 g/dL (ref 31.5–35.7)
MCV: 89 fL (ref 79–97)
Monocytes Absolute: 0.7 10*3/uL (ref 0.1–0.9)
Monocytes: 18 %
Neutrophils Absolute: 1.5 10*3/uL (ref 1.4–7.0)
Neutrophils: 40 %
Platelets: 132 10*3/uL — ABNORMAL LOW (ref 150–450)
RBC: 3.42 x10E6/uL — ABNORMAL LOW (ref 3.77–5.28)
RDW: 13.1 % (ref 11.7–15.4)
WBC: 3.7 10*3/uL (ref 3.4–10.8)

## 2020-06-07 LAB — AMMONIA: Ammonia: 59 ug/dL (ref 31–155)

## 2020-06-07 LAB — VALPROIC ACID LEVEL: Valproic Acid Lvl: 76 ug/mL (ref 50–100)

## 2020-06-16 ENCOUNTER — Telehealth: Payer: Self-pay

## 2020-06-16 NOTE — Telephone Encounter (Signed)
Pt verified by name and DOB,  normal results given per provider, pt voiced understanding all question answered.  Faxed to [facility]

## 2020-06-16 NOTE — Telephone Encounter (Signed)
-----   Message from Suzzanne Cloud, NP sent at 06/08/2020  5:58 AM EST ----- Labs show stable mild low HGB 10.4, platelets 132. Mildly elevated creatinine 1.36. Depakote level is within therapeutic range. Please send labs to [facility] agency to compare trends.

## 2020-06-20 ENCOUNTER — Telehealth: Payer: Self-pay

## 2020-06-20 NOTE — Telephone Encounter (Signed)
[name] calls nurse line reporting her neurologist drew some labs and wanted her to contact PCP about some abnormal results. The labs are visible in epic. Will forward to PCP.

## 2020-06-21 NOTE — Telephone Encounter (Signed)
Call patient to discuss recent labs from 06/06/2020 drawn by her neurologist. Cr 1.36, has fluctuated between 1-1.3 over the past year and has not consistently had reduced GFR.  Encouraged maintaining hydration and will follow creatinine on upcoming labs.  If persistently elevated, will discuss further labs/U/a.  [name], DO

## 2020-06-27 ENCOUNTER — Ambulatory Visit: Payer: Medicaid Other | Admitting: Neurology

## 2020-07-01 NOTE — Progress Notes (Signed)
I have reviewed and agreed above plan. 

## 2020-08-01 ENCOUNTER — Telehealth: Payer: Self-pay | Admitting: Allergy and Immunology

## 2020-08-01 NOTE — Telephone Encounter (Signed)
Called and spoke with patient. She stated that the cough has been going on for 4 weeks now. She is still taking her Symbicort, Spiriva, and Montelukast as prescribed. She states that she has been using her PRN inhaler/ nebulizer every 4 hours. She stated that last night she was coughing up brown mucous. Please advise.

## 2020-08-01 NOTE — Telephone Encounter (Signed)
Patient states her asthma has really been acting up and would like something called into [facility] on [address]. Patient is not wheezing, but has a deep cough. She is using her inhaler and nebulizer machine.   Please advise.

## 2020-08-01 NOTE — Telephone Encounter (Signed)
Left voicemail for patient to return call and schedule appointment

## 2020-08-01 NOTE — Telephone Encounter (Signed)
Lets make her appointment to be seen in [facility] tomorrow.

## 2020-08-02 ENCOUNTER — Telehealth: Payer: Self-pay

## 2020-08-02 NOTE — Telephone Encounter (Signed)
She can add in OTC Mucinex DM 1 tablet twice a day.  But with her immunosuppression she really needs to be seen at some point as soon as possible.

## 2020-08-02 NOTE — Telephone Encounter (Signed)
Called and spoke to patient and informed her of the note per Dr. Neldon Mc and patient agreed to do so. Patient was informed to call our office if she were to have any questions or concerns later and patient agreed to do so.

## 2020-08-02 NOTE — Telephone Encounter (Signed)
Patient states she does not have transportation so she can't make an appointment today. She states she is feeling a little better and wants to know if there is anything else she can take. Please advice. Thank you.

## 2020-08-02 NOTE — Telephone Encounter (Signed)
FL2 form in providers box for review.

## 2020-08-05 ENCOUNTER — Ambulatory Visit: Payer: Medicaid Other | Admitting: Podiatry

## 2020-08-09 ENCOUNTER — Other Ambulatory Visit: Payer: Self-pay

## 2020-08-09 MED ORDER — ACCU-CHEK GUIDE VI STRP
ORAL_STRIP | 10 refills | Status: DC
Start: 2020-08-09 — End: 2023-08-27

## 2020-08-16 ENCOUNTER — Ambulatory Visit: Payer: Medicaid Other | Admitting: Allergy and Immunology

## 2020-08-16 ENCOUNTER — Encounter: Payer: Self-pay | Admitting: Allergy and Immunology

## 2020-08-16 ENCOUNTER — Other Ambulatory Visit: Payer: Self-pay

## 2020-08-16 VITALS — BP 122/80 | Temp 98.2°F | Resp 18 | Ht 64.0 in | Wt 222.0 lb

## 2020-08-16 DIAGNOSIS — J4540 Moderate persistent asthma, uncomplicated: Secondary | ICD-10-CM | POA: Diagnosis not present

## 2020-08-16 DIAGNOSIS — J302 Other seasonal allergic rhinitis: Secondary | ICD-10-CM | POA: Diagnosis not present

## 2020-08-16 DIAGNOSIS — D849 Immunodeficiency, unspecified: Secondary | ICD-10-CM

## 2020-08-16 DIAGNOSIS — K219 Gastro-esophageal reflux disease without esophagitis: Secondary | ICD-10-CM | POA: Diagnosis not present

## 2020-08-16 DIAGNOSIS — L989 Disorder of the skin and subcutaneous tissue, unspecified: Secondary | ICD-10-CM | POA: Diagnosis not present

## 2020-08-16 MED ORDER — SPIRIVA RESPIMAT 1.25 MCG/ACT IN AERS: 2.0000 | INHALATION_SPRAY | Freq: Every day | RESPIRATORY_TRACT | 5 refills | Status: DC

## 2020-08-16 MED ORDER — FLUTICASONE PROPIONATE 50 MCG/ACT NA SUSP: 1.0000 | Freq: Two times a day (BID) | NASAL | 5 refills | Status: DC

## 2020-08-16 MED ORDER — MONTELUKAST SODIUM 10 MG PO TABS: ORAL_TABLET | ORAL | 5 refills | Status: DC

## 2020-08-16 MED ORDER — MOMETASONE FUROATE 0.1 % EX CREA: TOPICAL_CREAM | CUTANEOUS | 5 refills | Status: DC

## 2020-08-16 NOTE — Progress Notes (Signed)
[name]   Follow-up Note  Referring Provider: [name], DO Primary Provider: [name], DO Date of Office Visit: 08/16/2020  Subjective:   [name] (DOB: 05/28/1976) is a 45 y.o. 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  Her last visit to this clinic was 24 May 2020.  She has had less disturbance of her sleep with cough and she has not had to use a short acting bronchodilator this week and overall feels a little bit better while using her medications.  She did have a very active early part of the year with coughing and wheezing but fortunately that appears to have improved.  She continues on a combination inhaler and an anticholinergic inhaler.  Her nose is doing well.  She continues on a nasal steroid and a leukotriene modifier.  Her reflux is a little more active recently.  Apparently she was changed from pantoprazole to omeprazole for some unknown reason by her gastroenterologist.  She does believe that pantoprazole works better than her omeprazole.  She has had no need to use any topical steroids for her hands.  Allergies as of 08/16/2020      Reactions   Tramadol Hcl Itching, Nausea And Vomiting   Causes SEIZURES   Aspirin Nausea And Vomiting   Due to Stomach ulcer   Ibuprofen Other (See Comments)   Due to stomach ulcer   Penicillins Itching, Swelling, Rash   Breakout  Has patient had a PCN reaction causing immediate rash, facial/tongue/throat swelling, SOB or lightheadedness with hypotension: Yes Has patient had a PCN reaction causing severe rash  involving mucus membranes or skin necrosis: Yes Has patient had a PCN reaction that required hospitalization: Yes Has patient had a PCN reaction occurring within the last 10 years: No If all of the above answers are "NO", then may proceed with Cephalosporin use.     Pseudoephedrine Itching, Swelling   Tongue swelling   Sulfa Antibiotics Nausea And Vomiting   Latex Itching, Rash      Medication List      Accu-Chek Guide test strip Generic drug: glucose blood USE TO CHECK BLOOD SUGAR EVERY DAY   Accu-Chek Guide w/Device Kit 1 each by Does not apply route daily. Use to check blood sugar once daily or as directed.   Accu-Chek Softclix Lancets lancets Use to check blood sugar once daily in the morning prior to eating.   albuterol (2.5 MG/3ML) 0.083% nebulizer solution Commonly known as: PROVENTIL 1 vial via nebulizer every 4-6 hours as needed.   ProAir HFA 108 (90 Base) MCG/ACT inhaler Generic drug: albuterol Inhale 1-2 puffs into the lungs every 4 (four) hours as needed for shortness of breath.   ammonium lactate 12 % cream Commonly known as: AMLACTIN Apply topically 2 (two) times daily.   azelastine 0.1 % nasal spray Commonly known as: ASTELIN Place 2 sprays into both nostrils 2 (two) times daily.   benztropine 0.5 MG tablet Commonly known as: COGENTIN Take 0.5 mg by mouth at bedtime.   Blood Pressure Monitor Automat Devi Please use to check blood pressure daily/ as needed.   budesonide 0.5 MG/2ML nebulizer solution Commonly  known as: PULMICORT Take 2 mLs (0.5 mg total) by nebulization 2 (two) times a day.   budesonide-formoterol 160-4.5 MCG/ACT inhaler Commonly known as: SYMBICORT Inhale 2 puffs into the lungs 2 (two) times daily. Use with spacer and mask.   carboxymethylcellulose 0.5 % Soln Commonly known as: REFRESH PLUS Place 1 drop into both eyes 4 (four) times daily as needed (for dry eyes).   cetirizine 10 MG tablet Commonly known as: ZYRTEC TAKE 1  TABLET(10 MG) BY MOUTH DAILY   cycloSPORINE modified 100 MG Caps Take 100-125 mg by mouth 2 (two) times daily. Take 100 mg in the AM and 125 mg in the PM   divalproex 500 MG 24 hr tablet Commonly known as: DEPAKOTE ER Take 2 tablets (1,000 mg total) by mouth at bedtime.   doxepin 25 MG capsule Commonly known as: SINEQUAN TK 1 C PO HS   doxycycline 100 MG capsule Commonly known as: MONODOX Take 1 capsule (100 mg total) by mouth 2 (two) times daily.   famotidine 40 MG tablet Commonly known as: PEPCID Take 1 tablet (40 mg total) by mouth every evening.   ferrous sulfate 325 (65 FE) MG EC tablet Take 1 tablet by mouth daily.   FIBER (GUAR GUM) PO Take by mouth.   fluticasone 50 MCG/ACT nasal spray Commonly known as: FLONASE Place 1 spray into both nostrils 2 (two) times daily.   hydrocortisone valerate cream 0.2 % Commonly known as: WESTCORT APPLY 1 APPLICATION TOPICALLY TO AFFECTED AREAS ON HANDS AND FEET BID PRN FOR DERMATITIS   levothyroxine 50 MCG tablet Commonly known as: SYNTHROID TAKE 1 TABLET(50 MCG) BY MOUTH EVERY MORNING 30 MINUTES BEFORE FOOD   MAGnesium-Oxide 400 (241.3 Mg) MG tablet Generic drug: magnesium oxide Take 1 tablet by mouth 2 (two) times daily.   mometasone 0.1 % cream Commonly known as: ELOCON APPLY EXTERNALLY TO THE AFFECTED AREA TWICE DAILY   montelukast 10 MG tablet Commonly known as: SINGULAIR TAKE 1 TABLET(10 MG) BY MOUTH AT BEDTIME   multivitamin tablet Take 1 tablet by mouth daily. 9 am.   mycophenolate 250 MG capsule Commonly known as: CELLCEPT Take 1 capsule (250 mg total) by mouth 2 (two) times daily. One tablet daily   Nebulizer/Tubing/Mouthpiece Kit 2 each by Does not apply route as directed.   Nebulizers Misc 1 kit by Does not apply route as directed.   omeprazole 40 MG capsule Commonly known as: PRILOSEC Take 1 capsule (40 mg total) by mouth 2 (two) times daily.   pantoprazole 40 MG tablet Commonly known as:  PROTONIX Take 40 mg by mouth 2 (two) times daily.   polyethylene glycol 17 g packet Commonly known as: MIRALAX / GLYCOLAX Take 17 g by mouth daily as needed (for constipation). In water or juice   prazosin 1 MG capsule Commonly known as: MINIPRESS Take 2 mg by mouth at bedtime.   predniSONE 10 MG tablet Commonly known as: DELTASONE Take two tablets (83m) twice daily for three days, then one tablet (146m twice daily for three days, then STOP   RA Vitamin D-3 50 MCG (2000 UT) Caps Generic drug: Cholecalciferol Take 1 capsule by mouth daily.   risperiDONE 3 MG tablet Commonly known as: RISPERDAL Take 3 mg by mouth at bedtime.   Spacer/Aero-Hold ChGannett Cose as directed with inhaler.   Spiriva Respimat 1.25 MCG/ACT Aers Generic drug: Tiotropium Bromide Monohydrate Inhale 2 puffs into the lungs daily.       Past Medical History:  Diagnosis  Date  . Abnormal vaginal bleeding   . Allergy   . Anemia   . Arthritis    right knee  . Asthma 09-23-12   Asthma somewhat causing increase wheezing and mild congestion at present  . Biliary cirrhosis ([place]) 04/26/11  . Constipation   . DUB (dysfunctional uterine bleeding)   . Fall at home, initial encounter 01/21/2019  . GERD (gastroesophageal reflux disease)   . Health care maintenance 03/06/2014  . Intellectual disability   . Left-sided Bell's palsy 12/22/2018  . Liver transplant recipient [facility])   . Lumbar back pain 04/06/2014  . PONV (postoperative nausea and vomiting)   . 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 Productive cough 07/13/2019  . 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 Seizures ([place])    congenital epilepsy-hx. seizures  . Vitamin D deficiency     Past Surgical History:  Procedure Laterality Date  . adenonoidectomy    . APPENDECTOMY    . CHOLECYSTECTOMY    . COLONOSCOPY W/ BIOPSIES    . ESOPHAGOGASTRODUODENOSCOPY (EGD) WITH PROPOFOL N/A 10/01/2012   Procedure: ESOPHAGOGASTRODUODENOSCOPY (EGD) WITH  PROPOFOL;  Surgeon: Arta Silence, MD;  Location: [facility];  Service: Endoscopy;  Laterality: N/A;  . eustacean tubes    . EYE MUSCLE SURGERY    . LIVER TRANSPLANT      Review of systems negative except as noted in HPI / PMHx or noted below:  Review of Systems  Constitutional: Negative.   HENT: Negative.   Eyes: Negative.   Respiratory: Negative.   Cardiovascular: Negative.   Gastrointestinal: Negative.   Genitourinary: Negative.   Musculoskeletal: Negative.   Skin: Negative.   Neurological: Negative.   Endo/Heme/Allergies: Negative.   Psychiatric/Behavioral: Negative.      Objective:   Vitals:   08/16/20 1350  BP: 122/80  Resp: 18  Temp: 98.2 F (36.8 C)   Height: '5\' 4"'  (162.6 cm)  Weight: 222 lb (100.7 kg)   Physical Exam Constitutional:      Appearance: She is not diaphoretic.  HENT:     Head: Normocephalic.     Right Ear: Tympanic membrane, ear canal and external ear normal.     Left Ear: Tympanic membrane, ear canal and external ear normal.     Nose: Nose normal. No mucosal edema or rhinorrhea.     Mouth/Throat:     Pharynx: Uvula midline. No oropharyngeal exudate.  Eyes:     Conjunctiva/sclera: Conjunctivae normal.  Neck:     Thyroid: No thyromegaly.     Trachea: Trachea normal. No tracheal tenderness or tracheal deviation.  Cardiovascular:     Rate and Rhythm: Normal rate and regular rhythm.     Heart sounds: Normal heart sounds, S1 normal and S2 normal. No murmur heard.   Pulmonary:     Effort: No respiratory distress.     Breath sounds: Normal breath sounds. No stridor. No wheezing or rales.  Lymphadenopathy:     Head:     Right side of head: No tonsillar adenopathy.     Left side of head: No tonsillar adenopathy.     Cervical: No cervical adenopathy.  Skin:    Findings: No erythema or rash.     Nails: There is no clubbing.  Neurological:     Mental Status: She is alert.     Diagnostics:    Spirometry was performed and  demonstrated an FEV1 of 1.62 at 67 % of predicted.  She had a difficult time performing this spirometric maneuver.  The patient had  an Asthma Control Test with the following results: ACT Total Score: 20.    Assessment and Plan:   1. Asthma, moderate persistent, well-controlled   2. Seasonal allergies   3. LPRD (laryngopharyngeal reflux disease)   4. Inflammatory dermatosis   5. Immunosuppression ([place])     1.  Continue Symbicort 160 2 inhalations twice a day with spacer / mask  2.  Continue Spriva 1.25 Respimat - 2 inhalations 1 time per day  3.  Continue montelukast 10 mg one tablet once a day  5. Continue nasal Azelastine and fluticasone 1 spray each nostril twice a day  6. Continue cetirizine 10 mg one tablet once a day  7. Continue Pantoprazole or Omeprazole 40 mg 2 times per day  8. Continue ProAir HFA 2 inhalations of albuterol nebulizer every 4-6 hours if needed  9. Continue Mometasone 0.1% ointment 1-2 times a day if needed   10. Return to clinic in 12 weeks or earlier if problem  11.  Review recent chest x-ray results  Nella appears to be doing pretty well and we will keep her on combination inhaler and an anticholinergic inhaler as well as other anti-inflammatory medications as noted above and she will continue to treat her reflux/LPR.  Assuming she does well with this plan I will see her back in this clinic in 12 weeks or earlier if there is a problem.  Allena Katz, MD Allergy / Immunology [place]

## 2020-08-16 NOTE — Patient Instructions (Addendum)
  1.  Continue Symbicort 160 2 inhalations twice a day with spacer / mask  2.  Continue Spiriva 1.25 Respimat - 2 inhalations 1 time per day  3.  Continue montelukast 10 mg one tablet once a day  5. Continue nasal Azelastine and fluticasone 1 spray each nostril twice a day  6. Continue cetirizine 10 mg one tablet once a day  7. Continue Pantoprazole or Omeprazole 40 mg 2 times per day  8. Continue ProAir HFA 2 inhalations or albuterol nebulizer every 4-6 hours if needed  9. Continue Mometasone 0.1% cream 1-2 times a day if needed   10. Return to clinic in 12 weeks or earlier if problem  11.  Review recent chest x-ray results

## 2020-08-17 ENCOUNTER — Encounter: Payer: Self-pay | Admitting: Allergy and Immunology

## 2020-08-22 NOTE — Telephone Encounter (Signed)
Received phone call from Ms. Fulmore at [employer] regarding FL2 completion. Form needs further documentation in section 16. Section must be completed in entirety. If something does not apply please document n/a.   Attempted to retrieve document from previously faxed file, however, was unable to find.   I will place a new copy of the FL2 form in your box.   Once completed, form will need to be faxed to Ms. Fulmore at [phone].   Talbot Grumbling, RN

## 2020-08-23 ENCOUNTER — Ambulatory Visit
Admission: RE | Admit: 2020-08-23 | Discharge: 2020-08-23 | Disposition: A | Discharge status: Home / Self Care | Payer: Medicaid Other | Source: Ambulatory Visit | Attending: Allergy and Immunology | Admitting: Allergy and Immunology

## 2020-08-24 NOTE — Telephone Encounter (Signed)
I will complete it today and have it ready for fax this evening.   [name], DO

## 2020-08-25 NOTE — Telephone Encounter (Signed)
FL2 faxed to Ms. Fulmore at [employer] and a copy was made for batch scanning.

## 2020-09-16 ENCOUNTER — Ambulatory Visit: Payer: Medicaid Other | Admitting: Podiatry

## 2020-09-22 ENCOUNTER — Ambulatory Visit (INDEPENDENT_AMBULATORY_CARE_PROVIDER_SITE_OTHER): Payer: Medicaid Other | Admitting: Family Medicine

## 2020-09-22 ENCOUNTER — Encounter: Payer: Self-pay | Admitting: Family Medicine

## 2020-09-22 ENCOUNTER — Other Ambulatory Visit: Payer: Self-pay

## 2020-09-22 DIAGNOSIS — Z7689 Persons encountering health services in other specified circumstances: Secondary | ICD-10-CM | POA: Diagnosis not present

## 2020-09-22 NOTE — Progress Notes (Signed)
Appt start time: 1000 end time: 1100 (1 hour)  What do you want to get out of meeting with me today? Weight management  Relevant history/background: Morbid Obesity. Has followed with [facility]/Dr. Jenne Campus since 2019. Last seen 02/2018. Last A1C 5.1. She has hypothyroidism - last TSH normal in 07/2019.  Last weight: 225lbs (05/2020) Current weight: 218lbs (08/2020)  Assessment:  On a normal day, how many meals do you have? 2 meals/day (breakfast and dinner) How many snacks do you eat per day? 1 snack What beverages do you drink most days of the week? 16oz apple juice, water, hot chocolate, zero mountain dew What foods do you eat most days of the week? Microwave popcorn, plantar peanuts  Usual physical activity: 1 hour Zumba every day and 30 minutes Salsa dancing 2x/day every day Sleep: Patient estimates average of 7 hours of sleep/night.  24-hr recall:  (Up at 4 AM)  B (5 AM)-  Apple juice or orange juice (77LT) B (6 AM)- 3 slices of bacon (air fryer), 30 calorie fruit drink Snk (7 AM)-  2 cheese burger sliders (310 cal, 17g F, 14g P, frozen from [employer]) with pickles, mayo L (1 PM)-  1 oz bag of plantar peanuts, 16 oz orange juice  Snk ( PM)-   D (4 PM)-  1/2 C Chicken salad, 7 saltine crackers   Snk (9 PM)-  16 oz apple juice Typical day? Yes.     Lunch: 3 bags of 1oz plantar peanuts  PM Snack: 12 oz Hot Chocolate with water Dinner: Burgers with mayo + hotdog   Intervention: Completed diet and exercise history, and established: - SMART behavioral goals (Smart Goals: Specific, Measurable, Action-oriented, Realistic, Time-based.)    1. I will eat a fruit or vegetable with every meal. I will avoid canned fruits and vegetables.    2. I will limit juice to 8-16 ounces per day and dilute the juice with 1/2 water. - How will you document progress on goals?  Goals sheet   For recommendations and goals, see Patient Instructions.    Follow-up: 10/27/20 at 10:00am for nutrition follow  up  [facility]

## 2020-09-22 NOTE — Patient Instructions (Addendum)
Goals: 1. I will eat a fruit or vegetable with every meal.  - Some vegetables include carrots, salad mix, frozen vegetables without broccoli (prefer carrots and cauliflower), fresh or frozen brussels sprouts, fresh cabbage, green beans, frozen sugar snap peas, fresh beats, tomato's, celery, cucumber, spinach  - Fruits: apples, fresh pineapples, naval oranges, grapes, strawberries (fresh and frozen), blue berries (fresh and frozen)  - Use Yogurt (greek or regular) for smoothies  - I will avoid canned fruits and vegetables due to sodium. 2. I will drink 8-16 ounces of juice total per day. I will dilute the juice with 1/2 water and 1/2 juice.  3. I will Document your vegetables and fruits each day  I will follow up on 10/27/20 at 10:00am with Dr. Jenne Campus

## 2020-09-22 NOTE — Addendum Note (Signed)
Addended by: Danna Hefty on: 09/22/2020 11:42 AM   Modules accepted: Level of Service

## 2020-10-10 ENCOUNTER — Encounter: Payer: Self-pay | Admitting: Family Medicine

## 2020-10-10 ENCOUNTER — Other Ambulatory Visit: Payer: Self-pay

## 2020-10-10 ENCOUNTER — Ambulatory Visit (INDEPENDENT_AMBULATORY_CARE_PROVIDER_SITE_OTHER): Payer: Medicaid Other | Admitting: Family Medicine

## 2020-10-10 ENCOUNTER — Ambulatory Visit: Payer: Medicaid Other | Admitting: Podiatry

## 2020-10-10 VITALS — BP 114/72 | HR 105 | Ht 59.0 in | Wt 223.0 lb

## 2020-10-10 DIAGNOSIS — E038 Other specified hypothyroidism: Secondary | ICD-10-CM | POA: Diagnosis not present

## 2020-10-10 NOTE — Progress Notes (Signed)
    SUBJECTIVE:   CHIEF COMPLAINT / HPI: Check in thyroid   Ms. Manalang is a 45 year old female presenting for follow-up of her thyroid.  She reports she overall is doing well and has no other concerns today.  Hypothyroidism: Recently had her thyroid checked on 08/17/2020 with TSH 2.34 at that time.  She has been taking her Synthroid 50 mcg as prescribed daily on empty stomach. Denies any fatigue, sensitivity to cold/heat, diarrhea/constipation, palpitations.   Diet change: She recently met with Dr. Jenne Campus and Dr. Tarry Kos to work towards weight loss.  She has been working towards all 3 goals set in the AVS from that appointment.  She has been having a fruit or vegetable with every meal.  Drinking about 8 ounces maximum of juice with water every day.  She also reports she is done a good job of writing everything down.  However, despite her changes, she is frustrated that her weight has stayed stable.   PERTINENT  PMH / PSH: Asthma, primary biliary cirrhosis s/p liver transplant on chronic immunosuppression, seizure disorder, subclinical hypothyroidism, developmental delay with moderate cognitive disability, anemia  OBJECTIVE:   BP 114/72   Pulse (!) 105   Ht $R'4\' 11"'nF$  (1.499 m)   Wt 223 lb (101.2 kg)   LMP 09/23/2020 (Approximate)   SpO2 99%   BMI 45.04 kg/m   General: Alert, NAD HEENT: NCAT, MMM, thyroid nonenlarged without palpation of nodules Cardiac: RRR no m/g/r Lungs: Clear bilaterally, no increased WOB  Msk: Normal gait  Ext: Warm, dry, 2+ distal pulses, no edema   ASSESSMENT/PLAN:   Subclinical hypothyroidism Well-controlled and asymptomatic. TSH 2.3 in 07/2020, will not recheck today. Continue Synthroid 50 mcg as is, recheck yearly unless clinical concern.  Morbid obesity (HCC) BMI 45.  Currently working with Dr. Jenne Campus for long-term nutritional change, fortunately has progressed well through her set goals.  Provided congratulations and encouragement to continue her efforts.   Follow-up with Dr. Jenne Campus in 10/2020.    Performed medication reconciliation today, medications UTD in epic.  Follow-up in approximately 6 months to check-in or sooner if needed.  [name], [place]

## 2020-10-10 NOTE — Patient Instructions (Addendum)
It was great to see you today! Your thyroid looks great.   Keep up the great work with your food goals!

## 2020-10-11 ENCOUNTER — Encounter: Payer: Self-pay | Admitting: Family Medicine

## 2020-10-11 NOTE — Assessment & Plan Note (Signed)
Well-controlled and asymptomatic. TSH 2.3 in 07/2020, will not recheck today. Continue Synthroid 50 mcg as is, recheck yearly unless clinical concern.

## 2020-10-11 NOTE — Assessment & Plan Note (Signed)
BMI 45.  Currently working with Dr. Jenne Campus for long-term nutritional change, fortunately has progressed well through her set goals.  Provided congratulations and encouragement to continue her efforts.  Follow-up with Dr. Jenne Campus in 10/2020.

## 2020-10-19 ENCOUNTER — Other Ambulatory Visit: Payer: Self-pay | Admitting: Family Medicine

## 2020-10-19 ENCOUNTER — Other Ambulatory Visit: Payer: Self-pay | Admitting: Allergy and Immunology

## 2020-10-19 DIAGNOSIS — E038 Other specified hypothyroidism: Secondary | ICD-10-CM

## 2020-10-27 ENCOUNTER — Ambulatory Visit: Payer: Medicaid Other | Admitting: Family Medicine

## 2020-10-27 ENCOUNTER — Encounter: Payer: Self-pay | Admitting: Family Medicine

## 2020-10-27 NOTE — Progress Notes (Signed)
24-hr recall:  (Up at 4 AM) B (10 AM)-  4 slc bacon, 10 cherry tomatoes, 9 oz swt tea, 16 oz water Snk (11:30)-  >1/2 c peanuts (530 kcal), 12 oz Gatorade (80 kcal) L (1 PM)-  2 frzn breaded fish filets, 8 oz diet flavored water,  Snk (3 PM)-  1/2 10" diam watermelon D (5 PM)-  6 frzn Tyson honey chx tenders, 12 oz frzn breaded green beans (880 kcal, 1720 mg Na), 8 oz diet flavored water,  Snk (10 PM)-  1 glazed donut, 1 banana, 12 oz diet soda Typical day? Yes.   Patient was encouraged to buy PLAIN vegetables (fresh or frozen).  Example from yesterday: Green beans provided 880 kcal, whereas 2 cups of plain green beans would be 80-90 kcal.  Also advised that 1/2 watermelon is excessive.   Goals pt is working on: 1. Get twice the volume of vegetables as either protein & carb foods for both lunch and dinner.  This includes NON-breaded, non-fried vegetables, which can be either fresh or frozen.   2. For breakfast, make sure you have some protein food and fruit.  For example,  a. Greek yogurt and fruit OR  b. SMALL amount of cereal ( to  cup Cheerios) with fruit and yogurt OR c. 2 boiled eggs, 1-2 slices of toast, and fruit 3. Get at least 48 oz of PLAIN water per day (which can include some plain lemon or lime).  Limit beverages that are not plain water, unsweet tea, or unsweet coffee.  The goal here is to help you not be dependent on sweet taste for your drinks.  We tend to like what we get used to, and PLAIN WATER is your body's preferred fluid.   Follow-up appt:  Weight check with Dr. Jenne Campus on Tuesday, Aug 9 at 2 PM.  We will have 1 or 2 physicians observing with [place] that afternoon.

## 2020-10-31 ENCOUNTER — Ambulatory Visit: Payer: Medicaid Other | Admitting: Podiatry

## 2020-11-15 ENCOUNTER — Ambulatory Visit: Payer: Medicaid Other | Admitting: Allergy and Immunology

## 2020-11-16 ENCOUNTER — Ambulatory Visit: Payer: Medicaid Other | Admitting: Podiatry

## 2020-11-21 ENCOUNTER — Ambulatory Visit: Payer: Medicaid Other | Admitting: Family Medicine

## 2020-11-24 ENCOUNTER — Other Ambulatory Visit: Payer: Self-pay | Admitting: Family Medicine

## 2020-11-24 DIAGNOSIS — Z1231 Encounter for screening mammogram for malignant neoplasm of breast: Secondary | ICD-10-CM

## 2020-12-09 ENCOUNTER — Ambulatory Visit: Payer: Medicaid Other | Admitting: Podiatry

## 2020-12-11 NOTE — Progress Notes (Deleted)
    SUBJECTIVE:   CHIEF COMPLAINT / HPI: ***  ***  PERTINENT  PMH / PSH:  Hypothyroidism    OBJECTIVE:   There were no vitals taken for this visit.  *** General: female appearing stated age in no acute distress HEENT: MMM, no oral lesions noted,Neck non-tender without lymphadenopathy, masses or thyromegaly*** Cardio: Normal S1 and S2, no S3 or S4. Rhythm is regular***. No murmurs or rubs.  Bilateral radial pulses palpable Pulm: Clear to auscultation bilaterally, no crackles, wheezing, or diminished breath sounds. Normal respiratory effort, stable on *** Abdomen: Bowel sounds normal. Abdomen soft and non-tender. *** Extremities: No peripheral edema. Warm/ well perfused. *** Neuro: pt alert and oriented x4    ASSESSMENT/PLAN:   No problem-specific Assessment & Plan notes found for this encounter.     Eulis Foster, MD [place]

## 2020-12-12 ENCOUNTER — Ambulatory Visit: Payer: Medicaid Other | Admitting: Family Medicine

## 2020-12-19 ENCOUNTER — Ambulatory Visit: Payer: Medicaid Other | Admitting: Podiatry

## 2020-12-22 ENCOUNTER — Ambulatory Visit: Payer: Medicaid Other | Admitting: Family Medicine

## 2020-12-26 ENCOUNTER — Ambulatory Visit: Payer: Medicaid Other | Admitting: Family Medicine

## 2021-01-03 ENCOUNTER — Ambulatory Visit: Payer: Medicaid Other | Admitting: Podiatry

## 2021-01-03 ENCOUNTER — Other Ambulatory Visit: Payer: Self-pay

## 2021-01-03 ENCOUNTER — Encounter: Payer: Self-pay | Admitting: Podiatry

## 2021-01-03 DIAGNOSIS — B351 Tinea unguium: Secondary | ICD-10-CM

## 2021-01-03 DIAGNOSIS — M79674 Pain in right toe(s): Secondary | ICD-10-CM | POA: Diagnosis not present

## 2021-01-03 DIAGNOSIS — M79675 Pain in left toe(s): Secondary | ICD-10-CM | POA: Diagnosis not present

## 2021-01-03 NOTE — Progress Notes (Signed)
This patient returns to the office for evaluation and treatment of long thick painful nails .  This patient is unable to trim her own nails since the patient cannot reach her feet.  Patient says the nails are painful walking and wearing her shoes.  He returns for preventive foot care services.  General Appearance  Alert, conversant and in no acute stress.  Vascular  Dorsalis pedis and posterior tibial  pulses are palpable  bilaterally.  Capillary return is within normal limits  bilaterally. Temperature is within normal limits  bilaterally.  Neurologic  Senn-Weinstein monofilament wire test within normal limits  bilaterally. Muscle power within normal limits bilaterally.  Nails Thick disfigured discolored nails with subungual debris  from hallux to fifth toes bilaterally. No evidence of bacterial infection or drainage bilaterally.  Orthopedic  No limitations of motion  feet .  No crepitus or effusions noted.  No bony pathology or digital deformities noted.  Skin  normotropic skin with no porokeratosis noted bilaterally.  No signs of infections or ulcers noted.     Onychomycosis  Pain in toes right foot  Pain in toes left foot  Debridement  of nails  1-5  B/L with a nail nipper.  Nails were then filed using a dremel tool with no incidents.    RTC 4 months   Gardiner Barefoot DPM

## 2021-01-19 ENCOUNTER — Ambulatory Visit: Payer: Medicaid Other

## 2021-01-22 NOTE — Progress Notes (Signed)
    SUBJECTIVE:   CHIEF COMPLAINT / HPI: abnormal periods request for personal care services  Patient reports that her last period was in June of this year and it was normal for her. Denies painful cramping during period. Patient states she was previously had to take OCP to help stop her abnormal vaginal bleeding in 2015 she otherwise has not been on any contraception. Patient states she has never been sexually active. She denies having any abnormal discharges. She reports that her mother had menopause when she was 20 years old.   Request for personal care services Patient is requesting information to start receiving care from a personal aide.  She does not have a form at this time.  Patient states that she needs help with bathing and preparing meals at home.  She states that she often has trouble with completing these tasks on her own.  Patient has history of seizure disorder, primary biliary cirrhosis, asthma, subclinical hypothyroidism, osteoarthritis of the lower extremities as well as morbid obesity and schizophrenia in the setting of intellectual disability    PERTINENT  PMH / PSH:  Primary biliary cirrhosis Seizure disorder Asthma Subclinical hypothyroidism Osteoarthritis of lower extremities Morbid obesity Schizophrenia  OBJECTIVE:   BP 98/62   Pulse 98   Wt 213 lb 3.2 oz (96.7 kg)   SpO2 100%   BMI 43.06 kg/m   General: female appearing stated age in no acute distress Cardio: Normal S1 and S2, no S3 or S4. Rhythm is regular. No murmurs or rubs.  Bilateral radial pulses palpable Pulm: Clear to auscultation bilaterally, no crackles, wheezing, or diminished breath sounds. Normal respiratory effort, stable on room air Abdomen: Bowel sounds normal. Abdomen soft and non-tender. Extremities: No peripheral edema. Warm/ well perfused. Neuro: pt alert and oriented x4    ASSESSMENT/PLAN:   Abnormal menstrual periods Patient reporting abnormal.  Patient mother entered menopause  at age 62, suspect patient may be entering perimenopause.  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  She is requesting personal care services -Consult to chronic care management for assistance with obtaining PCS for patient     Eulis Foster, MD [place]

## 2021-01-23 ENCOUNTER — Encounter: Payer: Self-pay | Admitting: Family Medicine

## 2021-01-23 ENCOUNTER — Other Ambulatory Visit: Payer: Self-pay

## 2021-01-23 ENCOUNTER — Ambulatory Visit: Payer: Medicaid Other | Admitting: Family Medicine

## 2021-01-23 VITALS — BP 98/62 | HR 98 | Wt 213.2 lb

## 2021-01-23 DIAGNOSIS — Z789 Other specified health status: Secondary | ICD-10-CM

## 2021-01-23 DIAGNOSIS — N926 Irregular menstruation, unspecified: Secondary | ICD-10-CM | POA: Diagnosis not present

## 2021-01-23 DIAGNOSIS — Z1211 Encounter for screening for malignant neoplasm of colon: Secondary | ICD-10-CM

## 2021-01-23 NOTE — Assessment & Plan Note (Signed)
Patient reporting abnormal.  Patient mother entered menopause at age 47, suspect patient may be entering perimenopause.  - keep journal of menses symptoms

## 2021-01-23 NOTE — Patient Instructions (Addendum)
Thank you for choosing [facility].   I will set you up for a colonoscopy. Please be on the lookout for a call from their office to schedule you for an appointment.   Please keep a journal of your period symptoms for the next 2 months and then follow up with me.   Please keep track of how heavy your periods are, how long they last and any painful or bothersome symptoms.

## 2021-01-26 DIAGNOSIS — Z789 Other specified health status: Secondary | ICD-10-CM | POA: Insufficient documentation

## 2021-01-26 NOTE — Assessment & Plan Note (Signed)
Patient reports difficulty with bathing and managing medications as well as preparing meals at home.  She is requesting personal care services -Consult to chronic care management for assistance with obtaining PCS for patient

## 2021-01-31 ENCOUNTER — Telehealth: Payer: Self-pay | Admitting: *Deleted

## 2021-01-31 NOTE — Chronic Care Management (AMB) (Signed)
  Care Management   Note  01/31/2021 Name: Kerri-Lee Olesh MRN: [id number] DOB: 05/19/1976  Gwenna Kyari Shindler is a 45 y.o. year old female who is a primary care patient of Simmons-Robinson, Makiera, MD. I reached out to [employer] by phone today in response to a referral sent by Ms. Keymoni Arnell Haymond's PCP, Simmons-Robinson, Riki Sheer, MD.    Ms. Waninger was given information about care management services today including:  Care management services include personalized support from designated clinical staff supervised by her physician, including individualized plan of care and coordination with other care providers 24/7 contact phone numbers for assistance for urgent and routine care needs. The patient may stop care management services at any time by phone call to the office staff.  Patient agreed to services and verbal consent obtained.   Follow up plan: Telephone appointment with care management team member scheduled for:02/08/21  [facility]  Direct Dial: [phone]

## 2021-02-01 DIAGNOSIS — F259 Schizoaffective disorder, unspecified: Secondary | ICD-10-CM | POA: Diagnosis not present

## 2021-02-01 DIAGNOSIS — M797 Fibromyalgia: Secondary | ICD-10-CM | POA: Diagnosis not present

## 2021-02-01 DIAGNOSIS — G40909 Epilepsy, unspecified, not intractable, without status epilepticus: Secondary | ICD-10-CM | POA: Diagnosis not present

## 2021-02-01 DIAGNOSIS — F819 Developmental disorder of scholastic skills, unspecified: Secondary | ICD-10-CM | POA: Diagnosis not present

## 2021-02-01 DIAGNOSIS — J45909 Unspecified asthma, uncomplicated: Secondary | ICD-10-CM | POA: Diagnosis not present

## 2021-02-01 DIAGNOSIS — K219 Gastro-esophageal reflux disease without esophagitis: Secondary | ICD-10-CM | POA: Diagnosis not present

## 2021-02-01 DIAGNOSIS — Z9181 History of falling: Secondary | ICD-10-CM | POA: Diagnosis not present

## 2021-02-01 DIAGNOSIS — Z7951 Long term (current) use of inhaled steroids: Secondary | ICD-10-CM | POA: Diagnosis not present

## 2021-02-08 ENCOUNTER — Ambulatory Visit: Payer: Medicaid Other | Admitting: Licensed Clinical Social Worker

## 2021-02-08 DIAGNOSIS — K219 Gastro-esophageal reflux disease without esophagitis: Secondary | ICD-10-CM | POA: Diagnosis not present

## 2021-02-08 DIAGNOSIS — M797 Fibromyalgia: Secondary | ICD-10-CM | POA: Diagnosis not present

## 2021-02-08 DIAGNOSIS — Z7189 Other specified counseling: Secondary | ICD-10-CM

## 2021-02-08 DIAGNOSIS — G40909 Epilepsy, unspecified, not intractable, without status epilepticus: Secondary | ICD-10-CM | POA: Diagnosis not present

## 2021-02-08 DIAGNOSIS — J45909 Unspecified asthma, uncomplicated: Secondary | ICD-10-CM | POA: Diagnosis not present

## 2021-02-08 DIAGNOSIS — Z741 Need for assistance with personal care: Secondary | ICD-10-CM

## 2021-02-08 DIAGNOSIS — Z7951 Long term (current) use of inhaled steroids: Secondary | ICD-10-CM | POA: Diagnosis not present

## 2021-02-08 DIAGNOSIS — Z9181 History of falling: Secondary | ICD-10-CM | POA: Diagnosis not present

## 2021-02-08 DIAGNOSIS — F259 Schizoaffective disorder, unspecified: Secondary | ICD-10-CM | POA: Diagnosis not present

## 2021-02-08 DIAGNOSIS — F819 Developmental disorder of scholastic skills, unspecified: Secondary | ICD-10-CM | POA: Diagnosis not present

## 2021-02-08 NOTE — Chronic Care Management (AMB) (Addendum)
Care Management Clinical Social Work Note  02/08/2021 Name: [name] MRN: [id number] DOB: 1975-07-09  [name] is a 45 y.o. year old female who is a primary care patient of Simmons-Robinson, Makiera, MD.  The Care Management team was consulted for assistance with chronic disease management and coordination needs.  Engaged with patient by telephone for initial visit in response to provider referral for social work chronic care management and care coordination services  Consent to Services:  Ms. Casamento was given information about Care Management services today including:  Care Management services includes personalized support from designated clinical staff supervised by her physician, including individualized plan of care and coordination with other care providers 24/7 contact phone numbers for assistance for urgent and routine care needs. The patient may stop case management services at any time by phone call to the office staff.  Patient agreed to services and consent obtained.   Assessment: .   Patient is currently experiencing difficulty with activities of daily living.  She reports she has nurse that assist with medication weekly, mental health is being managed by [facility]; sees her provider quarterly. No other needs identified during this assessment.. See Care Plan below for interventions and patient self-care actives.  Recommendation: Patient may benefit from, and is in agreement for [facility] referral .   Follow up Plan:  No follow up scheduled with CCM LCSW at this time. Will follow up with patient in 2 to 3 weeks as needed .Patient will call office if needed prior to next encounter. CCM LCSW will continue to collaborate with PCP and [facility] in order to meet patient's needs .     Review of patient past medical history, allergies, medications, and health status, including review of relevant consultants reports was performed today as part of a comprehensive  evaluation and provision of chronic care management and care coordination services.  SDOH (Social Determinants of Health) assessments and interventions performed:  SDOH Interventions    Flowsheet Row Most Recent Value  SDOH Interventions   Transportation Interventions --  [uses SCAT and Medicaid Transportation]        Advanced Directives Status:  Discussed with patient she states she has already completed one. ( Will discuss again at later date)  Care Plan  Allergies  Allergen Reactions   Tramadol Hcl Itching and Nausea And Vomiting    Causes SEIZURES   Aspirin Nausea And Vomiting    Due to Stomach ulcer   Ibuprofen Other (See Comments)    Due to stomach ulcer   Penicillins Itching, Swelling and Rash    Breakout  Has patient had a PCN reaction causing immediate rash, facial/tongue/throat swelling, SOB or lightheadedness with hypotension: Yes Has patient had a PCN reaction causing severe rash involving mucus membranes or skin necrosis: Yes Has patient had a PCN reaction that required hospitalization: Yes Has patient had a PCN reaction occurring within the last 10 years: No If all of the above answers are "NO", then may proceed with Cephalosporin use.      Pseudoephedrine Itching and Swelling    Tongue swelling   Sulfa Antibiotics Nausea And Vomiting   Latex Itching and Rash    Outpatient Encounter Medications as of 02/08/2021  Medication Sig Note   ACCU-CHEK SOFTCLIX LANCETS lancets Use to check blood sugar once daily in the morning prior to eating.    albuterol (PROVENTIL) (2.5 MG/3ML) 0.083% nebulizer solution 1 vial via nebulizer every 4-6 hours as needed.    ammonium lactate (AMLACTIN)  12 % cream Apply topically 2 (two) times daily.    azelastine (ASTELIN) 0.1 % nasal spray Place 2 sprays into both nostrils 2 (two) times daily.    benztropine (COGENTIN) 0.5 MG tablet Take 0.5 mg by mouth at bedtime.    Blood Glucose Monitoring Suppl (ACCU-CHEK GUIDE) w/Device KIT 1  each by Does not apply route daily. Use to check blood sugar once daily or as directed.    Blood Pressure Monitoring (BLOOD PRESSURE MONITOR AUTOMAT) DEVI Please use to check blood pressure daily/ as needed.    budesonide (PULMICORT) 0.5 MG/2ML nebulizer solution Take 2 mLs (0.5 mg total) by nebulization 2 (two) times a day.    budesonide-formoterol (SYMBICORT) 160-4.5 MCG/ACT inhaler Inhale 2 puffs into the lungs 2 (two) times daily. Use with spacer and mask.    carboxymethylcellulose (REFRESH PLUS) 0.5 % SOLN Place 1 drop into both eyes 4 (four) times daily as needed (for dry eyes).    cetirizine (ZYRTEC) 10 MG tablet TAKE 1 TABLET(10 MG) BY MOUTH DAILY    cycloSPORINE modified 100 MG CAPS Take 100-125 mg by mouth 2 (two) times daily. Take 100 mg in the AM and 125 mg in the PM    divalproex (DEPAKOTE ER) 500 MG 24 hr tablet Take 2 tablets (1,000 mg total) by mouth at bedtime.    doxepin (SINEQUAN) 25 MG capsule TK 1 C PO HS    famotidine (PEPCID) 40 MG tablet Take 1 tablet (40 mg total) by mouth every evening.    ferrous sulfate 325 (65 FE) MG EC tablet Take 1 tablet by mouth daily.    FIBER, GUAR GUM, PO Take by mouth.    fluticasone (FLONASE) 50 MCG/ACT nasal spray Place 1 spray into both nostrils 2 (two) times daily.    glucose blood (ACCU-CHEK GUIDE) test strip USE TO CHECK BLOOD SUGAR EVERY DAY    levothyroxine (SYNTHROID) 50 MCG tablet TAKE 1 TABLET(50 MCG) BY MOUTH EVERY MORNING 30 MINUTES BEFORE FOOD    MAGNESIUM-OXIDE 400 (241.3 Mg) MG tablet Take 1 tablet by mouth 2 (two) times daily.    mometasone (ELOCON) 0.1 % cream Can use on skin one to two times daily if needed.    montelukast (SINGULAIR) 10 MG tablet TAKE 1 TABLET(10 MG) BY MOUTH AT BEDTIME    Multiple Vitamin (MULTIVITAMIN) tablet Take 1 tablet by mouth daily. 9 am.    mycophenolate (CELLCEPT) 250 MG capsule Take 1 capsule (250 mg total) by mouth 2 (two) times daily. One tablet daily    Nebulizers MISC 1 kit by Does not apply  route as directed.    omeprazole (PRILOSEC) 40 MG capsule Take 1 capsule (40 mg total) by mouth 2 (two) times daily.    pantoprazole (PROTONIX) 40 MG tablet Take 40 mg by mouth 2 (two) times daily.    polyethylene glycol (MIRALAX / GLYCOLAX) packet Take 17 g by mouth daily as needed (for constipation). In water or juice 10/15/2013: .   prazosin (MINIPRESS) 1 MG capsule Take 2 mg by mouth at bedtime.     PROAIR HFA 108 (90 Base) MCG/ACT inhaler Inhale 1-2 puffs into the lungs every 4 (four) hours as needed for shortness of breath.    RA VITAMIN D-3 2000 units CAPS Take 1 capsule by mouth daily.     Respiratory Therapy Supplies (NEBULIZER/TUBING/MOUTHPIECE) KIT 2 each by Does not apply route as directed.    risperiDONE (RISPERDAL) 3 MG tablet Take 3 mg by mouth at bedtime.  Spacer/Aero-Hold Chamber Mask MISC Use as directed with inhaler.    Tiotropium Bromide Monohydrate (SPIRIVA RESPIMAT) 1.25 MCG/ACT AERS Inhale 2 puffs into the lungs daily.    No facility-administered encounter medications on file as of 02/08/2021.    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   Thrombocytopenia ([place]) 03/09/2013   Seizure disorder ([place]) 03/01/2013   Schizophrenia, paranoid type ([place]) 06/25/2012    Class: Chronic   Primary biliary cirrhosis ([place]) 05/07/2011   Menorrhagia 12/22/2010   ANEMIA 03/03/2010   GERD 07/21/2008   CARDIAC FLOW MURMUR 10/16/2007   Asthma with allergic rhinitis 07/30/2006   Morbid obesity ([place]) 07/25/2006   Mental retardation 07/25/2006   OSTEOARTHRITIS, LOWER LEG 07/25/2006    Conditions to be addressed/monitored: ;  Level of care concerns  Care Plan : General Social Work (Adult)  Updates made by Maurine Cane, LCSW since 02/08/2021 12:00 AM     Problem: Mobility and Independence      Goal: Mobility and Independence Optimized with personal care services   Start Date: 02/08/2021  This Visit's Progress: On track  Priority: High  Note:   Current Barriers:   Patient unable to consistently perform activities of daily living and needs assistance in order to meet this unmet need Clinical Goals: Over the next 45 to 60 days, patient will have personal care needs met as evident by having PCS Aide in the home assisting with needs.  Clinical Interventions : Assessed needs, level of care concerns, basic eligibility and provided education on Personal Care Service process,  Collaborate with primary care provider ref completing PCS referral ( Referral placed in PCP's mailbox 02/08/2021 ) Offered list of PCS agencies and what to expect with PCS process ( patient knows who she wants to use) PCS referral will be faxed to [employer] at [phone] once completed and signed by PCP LCSW will collaborate with [facility] to verify application is received and processed.  Review various resources, discussed options and provided patient information about Department of Social Services ( food stamps ) Transportation provided by [employer] (PCS)  Solution-Focused Strategies, Active listening / Reflection utilized , Problem [place] , and Made referral to [place] care team collaboration (see longitudinal plan of care) Patient Goals/Self-Care Activities: Over the next 30 days Select 2 to 3 agencies you would like to use, keep this until your assessment is completed by [facility] Return calls from [facility] or call them directly if you have questions [phone] or [place], [place] / [place]   [phone] 12:15 PM    I agree with the above documentation.  Eulis Foster, MD [place], PGY-3

## 2021-02-08 NOTE — Patient Instructions (Signed)
Licensed Clinical Social Worker Visit Information  Goals we discussed today:   Goals Addressed             This Visit's Progress    Client will have ADL/IADL needs addressed by Personal care services   On track    Patient Goals/Self-Care Activities: Over the next 30 days Select 2 to 3 agencies you would like to use, keep this until your assessment is completed by [name] Return calls from [facility] or call them directly if you have questions [phone] or [phone]         [name] was given information about Care Management services today including:  Care Management services include personalized support from designated clinical staff supervised by her physician, including individualized plan of care and coordination with other care providers 24/7 contact phone numbers for assistance for urgent and routine care needs. The patient may stop Care Management services at any time by phone call to the office staff.  Patient agreed to services and verbal consent obtained.   Patient verbalizes understanding of instructions provided today and agrees to view in [place].   Follow up plan: SW will follow up with patient by phone over the next 2 to 3 weeks  [name], [name] / [name]   [phone] 12:18 PM

## 2021-02-15 ENCOUNTER — Ambulatory Visit: Payer: Self-pay | Admitting: Licensed Clinical Social Worker

## 2021-02-15 DIAGNOSIS — G40909 Epilepsy, unspecified, not intractable, without status epilepticus: Secondary | ICD-10-CM | POA: Diagnosis not present

## 2021-02-15 DIAGNOSIS — F259 Schizoaffective disorder, unspecified: Secondary | ICD-10-CM | POA: Diagnosis not present

## 2021-02-15 DIAGNOSIS — K219 Gastro-esophageal reflux disease without esophagitis: Secondary | ICD-10-CM | POA: Diagnosis not present

## 2021-02-15 DIAGNOSIS — Z7689 Persons encountering health services in other specified circumstances: Secondary | ICD-10-CM

## 2021-02-15 DIAGNOSIS — M797 Fibromyalgia: Secondary | ICD-10-CM | POA: Diagnosis not present

## 2021-02-15 DIAGNOSIS — J45909 Unspecified asthma, uncomplicated: Secondary | ICD-10-CM | POA: Diagnosis not present

## 2021-02-15 DIAGNOSIS — Z7951 Long term (current) use of inhaled steroids: Secondary | ICD-10-CM | POA: Diagnosis not present

## 2021-02-15 DIAGNOSIS — F819 Developmental disorder of scholastic skills, unspecified: Secondary | ICD-10-CM | POA: Diagnosis not present

## 2021-02-15 DIAGNOSIS — Z9181 History of falling: Secondary | ICD-10-CM | POA: Diagnosis not present

## 2021-02-15 NOTE — Patient Instructions (Signed)
   Goals Addressed             This Visit's Progress    Client will have ADL/IADL needs addressed by Personal care services   On track    Patient Goals/Self-Care Activities: Over the next 30 days PCS referral faxed to [facility] on 02/15/2021 Select 2 to 3 agencies you would like to use, keep this until your assessment is completed by [employer] Return calls from [facility] or call them directly if you have questions [phone] or [phone]        Patient was not contacted during this encounter.  LCSW collaborated with care team to accomplish patient's care plan goal   [name], [place]

## 2021-02-15 NOTE — Chronic Care Management (AMB) (Signed)
  Care Management  Collaboration  Note  02/15/2021 Name: Kela Baccari MRN: [id number] DOB: 06/16/1975  Avonna Kamill Fulbright is a 45 y.o. year old female who is a primary care patient of Simmons-Robinson, Makiera, MD. The CCM team was consulted reference care coordination needs for Level of Care Concerns.  Assessment: Patient was not interviewed or contacted during this encounter. See Care Plan or interventions for patient self-care actives.   Intervention:Conducted brief assessment, recommendations and relevant information discussed. CCM LCSW collaborated with [facility] and PCP .    Follow up Plan: No follow up scheduled with CCM LCSW at this time. Will follow up with patient in 21 days .Patient will call office if needed prior to next encounter.    Review of patient past medical history, allergies, medications, and health status, including review of pertinent consultant reports was performed as part of comprehensive evaluation and provision of care management/care coordination services.   Care Plan Conditions to be addressed/monitored per PCP order: , Level of care concerns   Care Plan : General Social Work (Adult)  Updates made by Maurine Cane, LCSW since 02/15/2021 12:00 AM     Problem: Mobility and Independence      Goal: Mobility and Independence Optimized with personal care services   Start Date: 02/08/2021  This Visit's Progress: On track  Recent Progress: On track  Priority: High  Note:   Current Barriers:   Waiting on [facility] to review referral and schedule assessment  Patient unable to consistently perform activities of daily living and needs assistance in order to meet this unmet need Clinical Goals: Over the next 45 to 60 days, patient will have personal care needs met as evident by having PCS Aide in the home assisting with needs.  Clinical Interventions : Collaborate with primary care provider ref completing PCS referral  Referral completed by PCP and  faxed to [facility] by LCSW on 02/15/2021 LCSW will collaborate with [facility] to verify application is received and processed.  Review various resources, discussed options and provided patient information about Department of Social Services ( food stamps ) Transportation provided by [employer] (PCS)  Solution-Focused Strategies, Active listening / Reflection utilized , Problem [place] , and Made referral to [place] care team collaboration (see longitudinal plan of care) Patient Goals/Self-Care Activities: Over the next 30 days Select 2 to 3 agencies you would like to use, keep this until your assessment is completed by [facility] Return calls from [facility] or call them directly if you have questions [phone] or [place], [place] / [place]   [phone] 4:30 PM

## 2021-02-16 ENCOUNTER — Encounter: Payer: Self-pay | Admitting: Family Medicine

## 2021-02-16 ENCOUNTER — Ambulatory Visit: Payer: Medicaid Other | Admitting: Family Medicine

## 2021-02-16 ENCOUNTER — Other Ambulatory Visit: Payer: Self-pay

## 2021-02-16 DIAGNOSIS — Z7689 Persons encountering health services in other specified circumstances: Secondary | ICD-10-CM | POA: Diagnosis not present

## 2021-02-16 NOTE — Progress Notes (Signed)
[name] was seen for a weight check today, which was 207.8 lb, down from 213 lb on 01/23/21 and 222 lb in March 2022.   She has followed a "Lose 10 lb in 3 days" diet for 3 days in July and 3 days in August.  Plans to use this diet 3 days of each month.    24-hr recall:  (Up at 9 AM) B (10 AM)-  2 slc frozen breaded fish (prepared by Shakya, shared with her aide) Snk ( AM)-  16 oz water L (1 PM)-  1 hambgr (no bun), mustard, 6 strawberries, 6 slc canned pineapple  Snk ( PM)-  --- D (4 PM)-  3 oz country-fried steak, baked beans, grn beans, 6 baby carrots, 1 c apple juice  Snk (9 PM)-  4 oz grape juice Typical day? Yes.  Except usually gets 2-3 X 16 oz water.    [name] has kept sporadic food records, which reveal poorly balanced meals as well as some skipped meals, e.g., Monday's breakfast was 1.5 c potato salad, 1 c pasta salad, and 4 oz juice.   Veg's [name] has currently: frozen broccoli, spinach, and cauliflower, fresh carrots and onion.    Behavioral goals encouraged today (and printed out for her): Include vegetables for both lunch and dinner.  This includes NON-breaded, non-fried vegetables, which can be either fresh or frozen.   For breakfast, make sure you have some protein food and fruit.  For example,  [place] yogurt and fruit OR  SMALL amount of cereal ( to  cup Cheerios) with fruit and yogurt OR 2 boiled eggs, 1-2 slices of toast, and fruit Get at least 48 oz (3 bottles) of PLAIN water per day.  Limit beverages that are not plain water, unsweet tea, or unsweet coffee. Make juice a SOMETIMES drink, not an EVERYDAY drink.  If you must drink juice, limit it to 4 oz at a time. The goal here is to help you not be dependent on sweet taste for your drinks.  We tend to like what we get used to, and PLAIN WATER is your body's preferred fluid.    If you want to use the 3-day diet once a month, that will be ok, but add at least one serving of a vegetable to lunch.  I would also recommend each of your  serving of meat, fish, or poultry be 3-4 ounces.    Follow-up appointment in [facility]: Thursday, November 10 at 9:30 AM.

## 2021-02-22 DIAGNOSIS — G40909 Epilepsy, unspecified, not intractable, without status epilepticus: Secondary | ICD-10-CM | POA: Diagnosis not present

## 2021-02-22 DIAGNOSIS — Z7951 Long term (current) use of inhaled steroids: Secondary | ICD-10-CM | POA: Diagnosis not present

## 2021-02-22 DIAGNOSIS — F819 Developmental disorder of scholastic skills, unspecified: Secondary | ICD-10-CM | POA: Diagnosis not present

## 2021-02-22 DIAGNOSIS — K219 Gastro-esophageal reflux disease without esophagitis: Secondary | ICD-10-CM | POA: Diagnosis not present

## 2021-02-22 DIAGNOSIS — M797 Fibromyalgia: Secondary | ICD-10-CM | POA: Diagnosis not present

## 2021-02-22 DIAGNOSIS — J45909 Unspecified asthma, uncomplicated: Secondary | ICD-10-CM | POA: Diagnosis not present

## 2021-02-22 DIAGNOSIS — Z9181 History of falling: Secondary | ICD-10-CM | POA: Diagnosis not present

## 2021-02-22 DIAGNOSIS — F259 Schizoaffective disorder, unspecified: Secondary | ICD-10-CM | POA: Diagnosis not present

## 2021-02-27 ENCOUNTER — Ambulatory Visit: Payer: Medicaid Other

## 2021-02-27 ENCOUNTER — Telehealth: Payer: Self-pay | Admitting: *Deleted

## 2021-02-27 ENCOUNTER — Ambulatory Visit
Admission: RE | Admit: 2021-02-27 | Discharge: 2021-02-27 | Disposition: A | Discharge status: Home / Self Care | Payer: Medicaid Other | Source: Ambulatory Visit | Attending: Family Medicine | Admitting: Family Medicine

## 2021-02-27 ENCOUNTER — Other Ambulatory Visit: Payer: Self-pay

## 2021-02-27 ENCOUNTER — Ambulatory Visit: Payer: Medicaid Other | Admitting: Licensed Clinical Social Worker

## 2021-02-27 DIAGNOSIS — Z1211 Encounter for screening for malignant neoplasm of colon: Secondary | ICD-10-CM | POA: Diagnosis not present

## 2021-02-27 DIAGNOSIS — Z7189 Other specified counseling: Secondary | ICD-10-CM

## 2021-02-27 DIAGNOSIS — Z7689 Persons encountering health services in other specified circumstances: Secondary | ICD-10-CM | POA: Diagnosis not present

## 2021-02-27 DIAGNOSIS — Z1231 Encounter for screening mammogram for malignant neoplasm of breast: Secondary | ICD-10-CM

## 2021-02-27 DIAGNOSIS — Z741 Need for assistance with personal care: Secondary | ICD-10-CM

## 2021-02-27 DIAGNOSIS — R109 Unspecified abdominal pain: Secondary | ICD-10-CM | POA: Diagnosis not present

## 2021-02-27 DIAGNOSIS — Z944 Liver transplant status: Secondary | ICD-10-CM | POA: Diagnosis not present

## 2021-02-27 NOTE — Chronic Care Management (AMB) (Signed)
Care Management Clinical Social Work Note  02/27/2021 Name: [name] MRN: [id number] DOB: [birth year]  [name] is a 45 y.o. year old female who is a primary care patient of Simmons-Robinson, Riki Sheer, MD.  The Care Management team was consulted for assistance coordination needs.Level of Care Concerns  Consent to Services:  The patient was given information about Care Management services, agreed to services, and gave verbal consent prior to initiation of services.  Please see initial visit note for detailed documentation.   Patient agreed to services today and consent obtained.   Assessment: Engaged with patient by phone in response to provider referral for social work care coordination services:Received message from [facility] that patient called wanting and update on her PCS referral.  Patient is making progress with PCS, referral has been received and processed . See Care Plan below for interventions and patient self-care actives.  Recommendation: Patient may benefit from, and is in agreement call [facility] to request assessment.   Follow up Plan: Patient would like continued follow-up from CCM LCSW .  per patient's request will follow up in 3 weeks.  Will call office if needed prior to next encounter.   Review of patient past medical history, allergies, medications, and health status, including review of relevant consultants reports was performed today as part of a comprehensive evaluation and provision of chronic care management and care coordination services.  SDOH (Social Determinants of Health) assessments and interventions performed:    Advanced Directives Status: Not addressed in this encounter.  Care Plan  Allergies  Allergen Reactions   Tramadol Hcl Itching and Nausea And Vomiting    Causes SEIZURES   Aspirin Nausea And Vomiting    Due to Stomach ulcer   Ibuprofen Other (See Comments)    Due to stomach ulcer   Penicillins Itching, Swelling and Rash     Breakout  Has patient had a PCN reaction causing immediate rash, facial/tongue/throat swelling, SOB or lightheadedness with hypotension: Yes Has patient had a PCN reaction causing severe rash involving mucus membranes or skin necrosis: Yes Has patient had a PCN reaction that required hospitalization: Yes Has patient had a PCN reaction occurring within the last 10 years: No If all of the above answers are "NO", then may proceed with Cephalosporin use.      Pseudoephedrine Itching and Swelling    Tongue swelling   Sulfa Antibiotics Nausea And Vomiting   Latex Itching and Rash    Outpatient Encounter Medications as of 02/27/2021  Medication Sig Note   ACCU-CHEK SOFTCLIX LANCETS lancets Use to check blood sugar once daily in the morning prior to eating.    albuterol (PROVENTIL) (2.5 MG/3ML) 0.083% nebulizer solution 1 vial via nebulizer every 4-6 hours as needed.    ammonium lactate (AMLACTIN) 12 % cream Apply topically 2 (two) times daily.    azelastine (ASTELIN) 0.1 % nasal spray Place 2 sprays into both nostrils 2 (two) times daily.    benztropine (COGENTIN) 0.5 MG tablet Take 0.5 mg by mouth at bedtime.    Blood Glucose Monitoring Suppl (ACCU-CHEK GUIDE) w/Device KIT 1 each by Does not apply route daily. Use to check blood sugar once daily or as directed.    Blood Pressure Monitoring (BLOOD PRESSURE MONITOR AUTOMAT) DEVI Please use to check blood pressure daily/ as needed.    budesonide (PULMICORT) 0.5 MG/2ML nebulizer solution Take 2 mLs (0.5 mg total) by nebulization 2 (two) times a day.    budesonide-formoterol (SYMBICORT) 160-4.5 MCG/ACT inhaler Inhale  2 puffs into the lungs 2 (two) times daily. Use with spacer and mask.    carboxymethylcellulose (REFRESH PLUS) 0.5 % SOLN Place 1 drop into both eyes 4 (four) times daily as needed (for dry eyes).    cetirizine (ZYRTEC) 10 MG tablet TAKE 1 TABLET(10 MG) BY MOUTH DAILY    cycloSPORINE modified 100 MG CAPS Take 100-125 mg by mouth 2 (two)  times daily. Take 100 mg in the AM and 125 mg in the PM    divalproex (DEPAKOTE ER) 500 MG 24 hr tablet Take 2 tablets (1,000 mg total) by mouth at bedtime.    doxepin (SINEQUAN) 25 MG capsule TK 1 C PO HS    famotidine (PEPCID) 40 MG tablet Take 1 tablet (40 mg total) by mouth every evening.    ferrous sulfate 325 (65 FE) MG EC tablet Take 1 tablet by mouth daily.    FIBER, GUAR GUM, PO Take by mouth.    fluticasone (FLONASE) 50 MCG/ACT nasal spray Place 1 spray into both nostrils 2 (two) times daily.    glucose blood (ACCU-CHEK GUIDE) test strip USE TO CHECK BLOOD SUGAR EVERY DAY    levothyroxine (SYNTHROID) 50 MCG tablet TAKE 1 TABLET(50 MCG) BY MOUTH EVERY MORNING 30 MINUTES BEFORE FOOD    MAGNESIUM-OXIDE 400 (241.3 Mg) MG tablet Take 1 tablet by mouth 2 (two) times daily.    mometasone (ELOCON) 0.1 % cream Can use on skin one to two times daily if needed.    montelukast (SINGULAIR) 10 MG tablet TAKE 1 TABLET(10 MG) BY MOUTH AT BEDTIME    Multiple Vitamin (MULTIVITAMIN) tablet Take 1 tablet by mouth daily. 9 am.    mycophenolate (CELLCEPT) 250 MG capsule Take 1 capsule (250 mg total) by mouth 2 (two) times daily. One tablet daily    Nebulizers MISC 1 kit by Does not apply route as directed.    omeprazole (PRILOSEC) 40 MG capsule Take 1 capsule (40 mg total) by mouth 2 (two) times daily.    pantoprazole (PROTONIX) 40 MG tablet Take 40 mg by mouth 2 (two) times daily.    polyethylene glycol (MIRALAX / GLYCOLAX) packet Take 17 g by mouth daily as needed (for constipation). In water or juice 10/15/2013: .   prazosin (MINIPRESS) 1 MG capsule Take 2 mg by mouth at bedtime.     PROAIR HFA 108 (90 Base) MCG/ACT inhaler Inhale 1-2 puffs into the lungs every 4 (four) hours as needed for shortness of breath.    RA VITAMIN D-3 2000 units CAPS Take 1 capsule by mouth daily.     Respiratory Therapy Supplies (NEBULIZER/TUBING/MOUTHPIECE) KIT 2 each by Does not apply route as directed.    risperiDONE  (RISPERDAL) 3 MG tablet Take 3 mg by mouth at bedtime.    Spacer/Aero-Hold Chamber Mask MISC Use as directed with inhaler.    Tiotropium Bromide Monohydrate (SPIRIVA RESPIMAT) 1.25 MCG/ACT AERS Inhale 2 puffs into the lungs daily.    No facility-administered encounter medications on file as of 02/27/2021.    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   Menorrhagia 12/22/2010   ANEMIA 03/03/2010   GERD 07/21/2008   CARDIAC FLOW MURMUR 10/16/2007   Asthma with allergic rhinitis 07/30/2006   Morbid obesity ([place]) 07/25/2006   Mental retardation 07/25/2006   OSTEOARTHRITIS, LOWER LEG 07/25/2006    Conditions to be addressed/monitored: ; Level of care concerns  Care Plan : General Social Work (Adult)  Updates made by Maurine Cane, LCSW since 02/27/2021 12:00 AM     Problem: Mobility and Independence      Goal: Mobility and Independence Optimized with personal care services   Start Date: 02/08/2021  This Visit's Progress: On track  Recent Progress: On track  Priority: High  Note:   Current Barriers:   Waiting on [facility] to schedule assessment  Patient unable to consistently perform activities of daily living and needs assistance in order to meet this unmet need Clinical Goals: Over the next 45 to 60 days, patient will have personal care needs met as evident by having PCS Aide in the home assisting with needs.  Clinical Interventions  : Collaborate with primary care provider ref completing PCS referral  Referral completed by PCP and faxed to [facility] by LCSW on 02/15/2021 LCSW will collaborate with [facility] to verify application is received and processed.  [facility] confirmed referral received and processed They have not reach out to patient but welcome patient to call them to schedule assessment Review various resources, discussed options and provided patient information about Department of Social Services ( food stamps ) Transportation provided by insurance provider [employer] transportation [place] (PCS)  Solution-Focused Strategies, Active listening / Reflection utilized , Problem [place] , and Made referral to [place] care team collaboration (see longitudinal plan of care) Patient Goals/Self-Care Activities: Over the next 30 days Select 2 to 3 agencies you would like to use, keep this until your assessment is completed by [facility] to schedule your assessment [phone] or [place], [place] / [place]   [phone]

## 2021-02-27 NOTE — Patient Instructions (Signed)
Visit Information   Goals Addressed             This Visit's Progress    Client will have ADL/IADL needs addressed by Personal care services   On track    Patient Goals/Self-Care Activities: Over the next 30 days Select 2 to 3 agencies you would like to use, keep this until your assessment is completed by [facility] Call [facility] to schedule your assessment [phone] or [phone]        Patient verbalizes understanding of instructions provided today and agrees to view in [place].   It was a pleasure speaking with you today. Please call the office if needed No follow up scheduled, per our conversation I will contact you in 3 weeks.   Casimer Lanius, LCSW Care Management & Coordination  [phone]

## 2021-02-27 NOTE — Telephone Encounter (Signed)
Patient left a message on the referral requesting an update on her PCS status.  Will send the LCSW to see if she has heard anything about this.  Thanks [employer]

## 2021-03-01 DIAGNOSIS — Z9181 History of falling: Secondary | ICD-10-CM | POA: Diagnosis not present

## 2021-03-01 DIAGNOSIS — Z7951 Long term (current) use of inhaled steroids: Secondary | ICD-10-CM | POA: Diagnosis not present

## 2021-03-01 DIAGNOSIS — Z944 Liver transplant status: Secondary | ICD-10-CM | POA: Diagnosis not present

## 2021-03-01 DIAGNOSIS — J45909 Unspecified asthma, uncomplicated: Secondary | ICD-10-CM | POA: Diagnosis not present

## 2021-03-01 DIAGNOSIS — F819 Developmental disorder of scholastic skills, unspecified: Secondary | ICD-10-CM | POA: Diagnosis not present

## 2021-03-01 DIAGNOSIS — F259 Schizoaffective disorder, unspecified: Secondary | ICD-10-CM | POA: Diagnosis not present

## 2021-03-01 DIAGNOSIS — M797 Fibromyalgia: Secondary | ICD-10-CM | POA: Diagnosis not present

## 2021-03-01 DIAGNOSIS — K219 Gastro-esophageal reflux disease without esophagitis: Secondary | ICD-10-CM | POA: Diagnosis not present

## 2021-03-01 DIAGNOSIS — G40909 Epilepsy, unspecified, not intractable, without status epilepticus: Secondary | ICD-10-CM | POA: Diagnosis not present

## 2021-03-03 DIAGNOSIS — Z79621 Long term (current) use of calcineurin inhibitor: Secondary | ICD-10-CM | POA: Diagnosis not present

## 2021-03-03 DIAGNOSIS — D649 Anemia, unspecified: Secondary | ICD-10-CM | POA: Diagnosis not present

## 2021-03-03 DIAGNOSIS — Z7951 Long term (current) use of inhaled steroids: Secondary | ICD-10-CM | POA: Diagnosis not present

## 2021-03-03 DIAGNOSIS — Z9181 History of falling: Secondary | ICD-10-CM | POA: Diagnosis not present

## 2021-03-03 DIAGNOSIS — F259 Schizoaffective disorder, unspecified: Secondary | ICD-10-CM | POA: Diagnosis not present

## 2021-03-03 DIAGNOSIS — F819 Developmental disorder of scholastic skills, unspecified: Secondary | ICD-10-CM | POA: Diagnosis not present

## 2021-03-03 DIAGNOSIS — D84821 Immunodeficiency due to drugs: Secondary | ICD-10-CM | POA: Diagnosis not present

## 2021-03-03 DIAGNOSIS — M797 Fibromyalgia: Secondary | ICD-10-CM | POA: Diagnosis not present

## 2021-03-03 DIAGNOSIS — Z944 Liver transplant status: Secondary | ICD-10-CM | POA: Diagnosis not present

## 2021-03-03 DIAGNOSIS — Z4823 Encounter for aftercare following liver transplant: Secondary | ICD-10-CM | POA: Diagnosis not present

## 2021-03-03 DIAGNOSIS — G40909 Epilepsy, unspecified, not intractable, without status epilepticus: Secondary | ICD-10-CM | POA: Diagnosis not present

## 2021-03-03 DIAGNOSIS — D849 Immunodeficiency, unspecified: Secondary | ICD-10-CM | POA: Diagnosis not present

## 2021-03-03 DIAGNOSIS — J45909 Unspecified asthma, uncomplicated: Secondary | ICD-10-CM | POA: Diagnosis not present

## 2021-03-03 DIAGNOSIS — K219 Gastro-esophageal reflux disease without esophagitis: Secondary | ICD-10-CM | POA: Diagnosis not present

## 2021-03-03 DIAGNOSIS — Z23 Encounter for immunization: Secondary | ICD-10-CM | POA: Diagnosis not present

## 2021-03-07 ENCOUNTER — Ambulatory Visit: Payer: Medicaid Other | Admitting: Licensed Clinical Social Worker

## 2021-03-07 DIAGNOSIS — Z7189 Other specified counseling: Secondary | ICD-10-CM

## 2021-03-07 DIAGNOSIS — Z741 Need for assistance with personal care: Secondary | ICD-10-CM

## 2021-03-07 NOTE — Chronic Care Management (AMB) (Signed)
Care Management  Clinical Social Work Note  03/07/2021 Name: [name] MRN: [id number] DOB: 11-19-1975  [name] is a 45 y.o. year old female who is a primary care patient of Simmons-Robinson, [place], MD. The CCM team was consulted for assistance with care coordination needs. Level of Care Concerns and Advanced Directive Education   Consent to Services:  The patient was given information about Care Management services, agreed to services, and gave verbal consent prior to initiation of services.  Please see initial visit note for detailed documentation.   Patient agreed to services today and consent obtained.  Engaged with patient by telephone for follow up visit in response to provider referral for social work care coordination services.   Assessment/Interventions: Assessed patient's current treatment, progress, coping skills, support system and barriers to care.  She is making progress with getting personal care services. Aide will start 03/13/2021 .  See Care Plan below for interventions and patient self-care actives.  Follow up Plan: Patient would like continued follow-up from CCM LCSW .  per patient's request will follow up in 30 days.  Will call office if needed prior to next encounter.   Review of patient past medical history, allergies, medications, and health status, including review of pertinent consultant reports was performed as part of comprehensive evaluation and provision of care management/care coordination services.   SDOH (Social Determinants of Health) screening and interventions performed today:   Advanced Directives Status:See Care Plan for related entries     Care Plan    Conditions to be addressed/monitored per PCP order: , Level of care concerns  Patient Care Plan: General Social Work (Adult)     Problem Identified: Mobility and Independence      Goal: Mobility and Independence Optimized with personal care services   Start Date: 02/08/2021   This Visit's Progress: On track  Recent Progress: On track  Priority: High  Note:   Current Barriers:   Patient unable to consistently perform activities of daily living and needs assistance in order to meet this unmet need Clinical Goals: Over the next 45 to 60 days, patient will have personal care needs met as evident by having PCS Aide in the home assisting with needs.  Clinical Interventions : PCS assessment completed; PCS services will start Monday Oct 17th hours 2:30 to 5:00 Review various resources, discussed options and provided patient information about Department of Social Services ( food stamps ) Transportation provided by [employer] (PCS)  Solution-Focused Strategies, Active listening / Reflection utilized , Problem [place] , and Made referral to [place] care team collaboration (see longitudinal plan of care) Patient Goals/Self-Care Activities: Over the next 30 days Call [facility] with questions [phone] or [phone]     Problem Identified: No Advance Directive      Goal: Effective Long-Term Care Planning with Advance Directive   Start Date: 03/07/2021  This Visit's Progress: On track  Priority: Medium  Note:   Current barriers:   Patient does not have an advance directive.  Needs education, support and coordination in order to meet this need. Clinical Goal(s): Over the next 60 days, the patient will review information on advance directive, complete advance directive packet and have notarized.  Interventions: Assessed understanding of Advance Directives. A voluntary discussion about advanced care planning including importance of advanced directives, healthcare proxy and living will was discussed with the patient.  Educational information on Advance Directives e-mail as well as an  advance directive packet mailed.  Inter-disciplinary care team collaboration  (see longitudinal plan of care) Patient Goals/Self-Care Activities : Over the next 60 days Review EMMI educational information on Advance Directive  Complete Advance Directive packet,  Have advance directive notarized and provide a copy to provider office Call LCSW if you have questions     Casimer Lanius, [place] / [place]   [phone]

## 2021-03-07 NOTE — Patient Instructions (Addendum)
Visit Information   Goals Addressed             This Visit's Progress    Client will have ADL/IADL needs addressed by Personal care services   On track    Patient Goals/Self-Care Activities: Over the next 30 days Congratulations on getting your aide Call [facility] with questions [phone] or [phone]      Effective [place]   On track    Patient Goals/Self-Care Activities : Over the next 60 days Review EMMI educational information on Advance Directive  Complete Advance Directive packet,  Have advance directive notarized and provide a copy to provider office Call LCSW if you have questions        Patient verbalizes understanding of instructions provided today and agrees to view in [place].   It was a pleasure speaking with you today. Please call the office if needed Follow up appointment is scheduled 04/10/2021  Casimer Lanius, [place] Management & Coordination  [phone]

## 2021-03-08 DIAGNOSIS — F819 Developmental disorder of scholastic skills, unspecified: Secondary | ICD-10-CM | POA: Diagnosis not present

## 2021-03-08 DIAGNOSIS — F259 Schizoaffective disorder, unspecified: Secondary | ICD-10-CM | POA: Diagnosis not present

## 2021-03-08 DIAGNOSIS — G40909 Epilepsy, unspecified, not intractable, without status epilepticus: Secondary | ICD-10-CM | POA: Diagnosis not present

## 2021-03-08 DIAGNOSIS — K219 Gastro-esophageal reflux disease without esophagitis: Secondary | ICD-10-CM | POA: Diagnosis not present

## 2021-03-08 DIAGNOSIS — M797 Fibromyalgia: Secondary | ICD-10-CM | POA: Diagnosis not present

## 2021-03-08 DIAGNOSIS — Z7951 Long term (current) use of inhaled steroids: Secondary | ICD-10-CM | POA: Diagnosis not present

## 2021-03-08 DIAGNOSIS — J45909 Unspecified asthma, uncomplicated: Secondary | ICD-10-CM | POA: Diagnosis not present

## 2021-03-08 DIAGNOSIS — Z9181 History of falling: Secondary | ICD-10-CM | POA: Diagnosis not present

## 2021-03-13 ENCOUNTER — Ambulatory Visit: Payer: Medicaid Other | Admitting: Family Medicine

## 2021-03-13 DIAGNOSIS — F200 Paranoid schizophrenia: Secondary | ICD-10-CM | POA: Diagnosis not present

## 2021-03-14 DIAGNOSIS — F200 Paranoid schizophrenia: Secondary | ICD-10-CM | POA: Diagnosis not present

## 2021-03-15 DIAGNOSIS — G40909 Epilepsy, unspecified, not intractable, without status epilepticus: Secondary | ICD-10-CM | POA: Diagnosis not present

## 2021-03-15 DIAGNOSIS — F259 Schizoaffective disorder, unspecified: Secondary | ICD-10-CM | POA: Diagnosis not present

## 2021-03-15 DIAGNOSIS — M797 Fibromyalgia: Secondary | ICD-10-CM | POA: Diagnosis not present

## 2021-03-15 DIAGNOSIS — J45909 Unspecified asthma, uncomplicated: Secondary | ICD-10-CM | POA: Diagnosis not present

## 2021-03-15 DIAGNOSIS — Z9181 History of falling: Secondary | ICD-10-CM | POA: Diagnosis not present

## 2021-03-15 DIAGNOSIS — F819 Developmental disorder of scholastic skills, unspecified: Secondary | ICD-10-CM | POA: Diagnosis not present

## 2021-03-15 DIAGNOSIS — Z7951 Long term (current) use of inhaled steroids: Secondary | ICD-10-CM | POA: Diagnosis not present

## 2021-03-15 DIAGNOSIS — K219 Gastro-esophageal reflux disease without esophagitis: Secondary | ICD-10-CM | POA: Diagnosis not present

## 2021-03-15 DIAGNOSIS — F200 Paranoid schizophrenia: Secondary | ICD-10-CM | POA: Diagnosis not present

## 2021-03-16 DIAGNOSIS — F200 Paranoid schizophrenia: Secondary | ICD-10-CM | POA: Diagnosis not present

## 2021-03-17 DIAGNOSIS — F200 Paranoid schizophrenia: Secondary | ICD-10-CM | POA: Diagnosis not present

## 2021-03-19 DIAGNOSIS — F200 Paranoid schizophrenia: Secondary | ICD-10-CM | POA: Diagnosis not present

## 2021-03-20 DIAGNOSIS — F200 Paranoid schizophrenia: Secondary | ICD-10-CM | POA: Diagnosis not present

## 2021-03-21 DIAGNOSIS — F200 Paranoid schizophrenia: Secondary | ICD-10-CM | POA: Diagnosis not present

## 2021-03-22 DIAGNOSIS — Z9181 History of falling: Secondary | ICD-10-CM | POA: Diagnosis not present

## 2021-03-22 DIAGNOSIS — J45909 Unspecified asthma, uncomplicated: Secondary | ICD-10-CM | POA: Diagnosis not present

## 2021-03-22 DIAGNOSIS — K219 Gastro-esophageal reflux disease without esophagitis: Secondary | ICD-10-CM | POA: Diagnosis not present

## 2021-03-22 DIAGNOSIS — Z7951 Long term (current) use of inhaled steroids: Secondary | ICD-10-CM | POA: Diagnosis not present

## 2021-03-22 DIAGNOSIS — F819 Developmental disorder of scholastic skills, unspecified: Secondary | ICD-10-CM | POA: Diagnosis not present

## 2021-03-22 DIAGNOSIS — F200 Paranoid schizophrenia: Secondary | ICD-10-CM | POA: Diagnosis not present

## 2021-03-22 DIAGNOSIS — M797 Fibromyalgia: Secondary | ICD-10-CM | POA: Diagnosis not present

## 2021-03-22 DIAGNOSIS — F259 Schizoaffective disorder, unspecified: Secondary | ICD-10-CM | POA: Diagnosis not present

## 2021-03-22 DIAGNOSIS — G40909 Epilepsy, unspecified, not intractable, without status epilepticus: Secondary | ICD-10-CM | POA: Diagnosis not present

## 2021-03-23 DIAGNOSIS — F200 Paranoid schizophrenia: Secondary | ICD-10-CM | POA: Diagnosis not present

## 2021-03-24 DIAGNOSIS — F200 Paranoid schizophrenia: Secondary | ICD-10-CM | POA: Diagnosis not present

## 2021-03-25 DIAGNOSIS — F200 Paranoid schizophrenia: Secondary | ICD-10-CM | POA: Diagnosis not present

## 2021-03-26 DIAGNOSIS — F200 Paranoid schizophrenia: Secondary | ICD-10-CM | POA: Diagnosis not present

## 2021-03-27 DIAGNOSIS — F200 Paranoid schizophrenia: Secondary | ICD-10-CM | POA: Diagnosis not present

## 2021-03-28 DIAGNOSIS — F200 Paranoid schizophrenia: Secondary | ICD-10-CM | POA: Diagnosis not present

## 2021-03-29 DIAGNOSIS — J45909 Unspecified asthma, uncomplicated: Secondary | ICD-10-CM | POA: Diagnosis not present

## 2021-03-29 DIAGNOSIS — F819 Developmental disorder of scholastic skills, unspecified: Secondary | ICD-10-CM | POA: Diagnosis not present

## 2021-03-29 DIAGNOSIS — F200 Paranoid schizophrenia: Secondary | ICD-10-CM | POA: Diagnosis not present

## 2021-03-29 DIAGNOSIS — Z9181 History of falling: Secondary | ICD-10-CM | POA: Diagnosis not present

## 2021-03-29 DIAGNOSIS — Z7951 Long term (current) use of inhaled steroids: Secondary | ICD-10-CM | POA: Diagnosis not present

## 2021-03-29 DIAGNOSIS — F259 Schizoaffective disorder, unspecified: Secondary | ICD-10-CM | POA: Diagnosis not present

## 2021-03-29 DIAGNOSIS — G40909 Epilepsy, unspecified, not intractable, without status epilepticus: Secondary | ICD-10-CM | POA: Diagnosis not present

## 2021-03-29 DIAGNOSIS — M797 Fibromyalgia: Secondary | ICD-10-CM | POA: Diagnosis not present

## 2021-03-29 DIAGNOSIS — K219 Gastro-esophageal reflux disease without esophagitis: Secondary | ICD-10-CM | POA: Diagnosis not present

## 2021-03-30 ENCOUNTER — Telehealth: Payer: Self-pay | Admitting: Allergy and Immunology

## 2021-03-30 DIAGNOSIS — F200 Paranoid schizophrenia: Secondary | ICD-10-CM | POA: Diagnosis not present

## 2021-03-30 MED ORDER — AZELASTINE HCL 0.1 % NA SOLN
2.0000 | Freq: Two times a day (BID) | NASAL | 5 refills | Status: DC
Start: 2021-03-30 — End: 2021-07-18

## 2021-03-30 MED ORDER — FAMOTIDINE 40 MG PO TABS: 40.0000 mg | ORAL_TABLET | Freq: Two times a day (BID) | ORAL | 1 refills | Status: DC

## 2021-03-30 MED ORDER — ALBUTEROL SULFATE (2.5 MG/3ML) 0.083% IN NEBU: 2.5000 mg | INHALATION_SOLUTION | RESPIRATORY_TRACT | 1 refills | Status: DC | PRN

## 2021-03-30 MED ORDER — FLUTICASONE PROPIONATE 50 MCG/ACT NA SUSP: 1.0000 | Freq: Two times a day (BID) | NASAL | 1 refills | Status: DC

## 2021-03-30 MED ORDER — PROAIR HFA 108 (90 BASE) MCG/ACT IN AERS: 1.0000 | INHALATION_SPRAY | RESPIRATORY_TRACT | 1 refills | Status: DC | PRN

## 2021-03-30 MED ORDER — BUDESONIDE 0.5 MG/2ML IN SUSP: 0.5000 mg | RESPIRATORY_TRACT | 1 refills | Status: DC | PRN

## 2021-03-30 MED ORDER — CETIRIZINE HCL 10 MG PO TABS: 10.0000 mg | ORAL_TABLET | Freq: Two times a day (BID) | ORAL | 1 refills | Status: DC | PRN

## 2021-03-30 MED ORDER — MONTELUKAST SODIUM 10 MG PO TABS: 10.0000 mg | ORAL_TABLET | Freq: Every day | ORAL | 1 refills | Status: DC

## 2021-03-30 MED ORDER — SPIRIVA RESPIMAT 1.25 MCG/ACT IN AERS: 2.0000 | INHALATION_SPRAY | Freq: Every day | RESPIRATORY_TRACT | 1 refills | Status: DC

## 2021-03-30 MED ORDER — BUDESONIDE-FORMOTEROL FUMARATE 160-4.5 MCG/ACT IN AERO: 2.0000 | INHALATION_SPRAY | Freq: Two times a day (BID) | RESPIRATORY_TRACT | 1 refills | Status: DC

## 2021-03-30 MED ORDER — MOMETASONE FUROATE 0.1 % EX CREA: TOPICAL_CREAM | Freq: Two times a day (BID) | CUTANEOUS | 1 refills | Status: DC | PRN

## 2021-03-30 NOTE — Telephone Encounter (Signed)
Called and spoke to patient and informed her of the note from [place] and sent in a courtesy refill for patient.Patient scheduled an appointment with Dr. Neldon Mc for May 30, 2021

## 2021-03-30 NOTE — Telephone Encounter (Signed)
Left message for patient to call the office in regards to refills. Patient was last seen 08/16/2020 and per after visit summary it stated to continue Symbicort however our office has not sent that medication in since 12/08/2019. We will need to see if patient has been using Symbicort. Patient was due back in August however canceled her appointment and is in need of an office visit.

## 2021-03-30 NOTE — Telephone Encounter (Signed)
Patient called and needs to have symbicort 160 called into [facility] [address] [phone]

## 2021-03-31 ENCOUNTER — Other Ambulatory Visit: Payer: Self-pay | Admitting: Allergy and Immunology

## 2021-03-31 ENCOUNTER — Telehealth: Payer: Self-pay | Admitting: Allergy and Immunology

## 2021-03-31 ENCOUNTER — Other Ambulatory Visit: Payer: Self-pay | Admitting: *Deleted

## 2021-03-31 DIAGNOSIS — F200 Paranoid schizophrenia: Secondary | ICD-10-CM | POA: Diagnosis not present

## 2021-03-31 MED ORDER — VENTOLIN HFA 108 (90 BASE) MCG/ACT IN AERS: 2.0000 | INHALATION_SPRAY | RESPIRATORY_TRACT | 1 refills | Status: DC | PRN

## 2021-03-31 NOTE — Telephone Encounter (Signed)
Patients pharmacy called needing the Proair inhaler switched to brand Ventolin due to her insurance (Medicaid). They will need a new prescription sent.

## 2021-03-31 NOTE — Telephone Encounter (Signed)
New prescription has been sent in to requested pharmacy.

## 2021-04-01 DIAGNOSIS — F200 Paranoid schizophrenia: Secondary | ICD-10-CM | POA: Diagnosis not present

## 2021-04-02 DIAGNOSIS — F200 Paranoid schizophrenia: Secondary | ICD-10-CM | POA: Diagnosis not present

## 2021-04-03 DIAGNOSIS — F200 Paranoid schizophrenia: Secondary | ICD-10-CM | POA: Diagnosis not present

## 2021-04-04 DIAGNOSIS — F200 Paranoid schizophrenia: Secondary | ICD-10-CM | POA: Diagnosis not present

## 2021-04-05 DIAGNOSIS — K219 Gastro-esophageal reflux disease without esophagitis: Secondary | ICD-10-CM | POA: Diagnosis not present

## 2021-04-05 DIAGNOSIS — F819 Developmental disorder of scholastic skills, unspecified: Secondary | ICD-10-CM | POA: Diagnosis not present

## 2021-04-05 DIAGNOSIS — Z7951 Long term (current) use of inhaled steroids: Secondary | ICD-10-CM | POA: Diagnosis not present

## 2021-04-05 DIAGNOSIS — G40909 Epilepsy, unspecified, not intractable, without status epilepticus: Secondary | ICD-10-CM | POA: Diagnosis not present

## 2021-04-05 DIAGNOSIS — J45909 Unspecified asthma, uncomplicated: Secondary | ICD-10-CM | POA: Diagnosis not present

## 2021-04-05 DIAGNOSIS — M797 Fibromyalgia: Secondary | ICD-10-CM | POA: Diagnosis not present

## 2021-04-05 DIAGNOSIS — Z9181 History of falling: Secondary | ICD-10-CM | POA: Diagnosis not present

## 2021-04-05 DIAGNOSIS — F259 Schizoaffective disorder, unspecified: Secondary | ICD-10-CM | POA: Diagnosis not present

## 2021-04-05 DIAGNOSIS — F200 Paranoid schizophrenia: Secondary | ICD-10-CM | POA: Diagnosis not present

## 2021-04-06 ENCOUNTER — Encounter: Payer: Self-pay | Admitting: Family Medicine

## 2021-04-06 ENCOUNTER — Ambulatory Visit: Payer: Medicaid Other | Admitting: Family Medicine

## 2021-04-06 ENCOUNTER — Other Ambulatory Visit: Payer: Self-pay

## 2021-04-06 DIAGNOSIS — Z7689 Persons encountering health services in other specified circumstances: Secondary | ICD-10-CM | POA: Diagnosis not present

## 2021-04-06 DIAGNOSIS — F200 Paranoid schizophrenia: Secondary | ICD-10-CM | POA: Diagnosis not present

## 2021-04-06 NOTE — Progress Notes (Signed)
[name] was seen for a weight check today, which was 211.8 lb, up nearlyy 5 lb from 02/16/21 (222 lb in March 2022).   She has discontinued using the "3-day weight loss diet," but is using many recipes from "[place],"    which she brought in today (contains several recipes that use ingredients such as soda, canned soup, and Velveeta cheese).  Aides come daily, usually 1 PM to 3:15 or 3:30 PM to help with cleaning and food prep.  While there, the weekend aide eats foods [name] has fixed as well as candy [name] has bought, e.g., Sunday they each ate beer-battered fish, Coke, and ice cream.  Her uncle had bought the soda for a roast beef recipe from [name]'s book.    24-hr recall:  (Up at 8 AM) B (8:15 AM)-  4-6 oz grape juice (with meds) B (9 AM)-  2-3 c collards, 1 ham hock with skin, Jimmy Dean sausage, egg, cheese croissant (410 calories), water L (12 PM)-  1 ham hock with skin, 2-3 c mixed greens, water Snk ( PM)-  --- D (2 PM)-  1 hamburger on bun, 2-3 c mixed greens, 1 orange, water Snk (~8 PM)-  4-6 oz grape juice (with meds) Typical day? Yes.    Recommendations printed out for [name]:   You do not need to keep feeding your weekend aide; she is getting paid to do work for you, not to sit down to eat food you  have prepared.  In fact, she should be making food for your dinner, which you can eat at a normal supper time after she leaves.  For any aide who is preparing a meal for you, please remind them that you NEED a vegetable at each meal, juices, and sodas or other sweet drinks are not helping you lose weight or be the healthiest you can be.    If you must use juice to take your meds, mix it with club soda or seltzer, so you will get less sugar and calories.    Please tell your uncle that the frozen [place] sausage croissants are not good for your liver because these are very high in sodium, fat, and calories.    Behavioral goals remain the same: Include vegetables for both lunch and  dinner.  This includes NON-breaded, non-fried vegetables, which can be either fresh or frozen.   For breakfast, make sure you have some protein food and fruit.  For example,  [place] yogurt and fruit OR  SMALL amount of cereal ( to  cup Cheerios) with fruit and yogurt OR 2 boiled eggs, 1-2 slices of toast, and fruit Get at least 48 oz (3 bottles) of PLAIN water per day.  Limit beverages that are not plain water, unsweet tea, or unsweet coffee. Remember that juice is a SOMETIMES drink, not an EVERYDAY drink.  If you must drink juice, limit it to 4 oz at a time.   Follow-up appointment in [facility]: Thursday, December 8 at 9:30 AM.

## 2021-04-07 ENCOUNTER — Ambulatory Visit: Payer: Medicaid Other | Admitting: Family Medicine

## 2021-04-07 DIAGNOSIS — F200 Paranoid schizophrenia: Secondary | ICD-10-CM | POA: Diagnosis not present

## 2021-04-07 NOTE — Progress Notes (Deleted)
    SUBJECTIVE:   CHIEF COMPLAINT / HPI: abnormal menses   Patient reports ***   PERTINENT  PMH / PSH: ***  OBJECTIVE:   There were no vitals taken for this visit.  ***  ASSESSMENT/PLAN:   No problem-specific Assessment & Plan notes found for this encounter.     Lori Foster, MD [place]

## 2021-04-08 DIAGNOSIS — F200 Paranoid schizophrenia: Secondary | ICD-10-CM | POA: Diagnosis not present

## 2021-04-09 DIAGNOSIS — F200 Paranoid schizophrenia: Secondary | ICD-10-CM | POA: Diagnosis not present

## 2021-04-10 ENCOUNTER — Ambulatory Visit: Payer: Medicaid Other | Admitting: Licensed Clinical Social Worker

## 2021-04-10 DIAGNOSIS — Z789 Other specified health status: Secondary | ICD-10-CM

## 2021-04-10 DIAGNOSIS — F200 Paranoid schizophrenia: Secondary | ICD-10-CM | POA: Diagnosis not present

## 2021-04-10 DIAGNOSIS — Z7189 Other specified counseling: Secondary | ICD-10-CM

## 2021-04-10 NOTE — Chronic Care Management (AMB) (Signed)
Care Management  Clinical Social Work Note  04/10/2021 Name: [name] MRN: [id number] DOB: May 06, 1976  [name] is a 45 y.o. year old female who is a primary care patient of Simmons-Robinson, Makiera, MD. The CCM team was consulted for assistance with care coordination needs. Patent attorney to Services:  The patient was given information about Care Management services, agreed to services, and gave verbal consent prior to initiation of services.  Please see initial visit note for detailed documentation.   Patient agreed to services today and consent obtained.  Engaged with patient by telephone for follow up visit in response to provider referral for social work care coordination services.   Assessment/Interventions: Assessed patient's current treatment, progress, coping skills, support system and barriers to care.  She is making progress with PCS services, aide is working out well.  Continue to work on Forensic scientist .  See Care Plan below for interventions and patient self-care actives.  Recent life changes or stressors: needs ramp repaired  Recommendation: Patient may benefit from, and is in agreement for LCSW to make referral to community housing solutions.   Follow up Plan: Patient would like continued follow-up from CCM LCSW .  per patient's request will follow up in 30 days.  Will call office if needed prior to next encounter.   Review of patient past medical history, allergies, medications, and health status, including review of pertinent consultant reports was performed as part of comprehensive evaluation and provision of care management/care coordination services.   SDOH (Social Determinants of Health) screening and interventions performed today:   Advanced Directives Status:See Care Plan for related entries     Care Plan    Conditions to be addressed/monitored per PCP order: , Level of care concerns  Care  Plan : General Social Work (Adult)  Updates made by Maurine Cane, LCSW since 04/10/2021 12:00 AM     Problem: Mobility and Independence      Goal: Mobility and Independence Optimized with personal care services Completed 04/10/2021  Start Date: 02/08/2021  This Visit's Progress: On track  Recent Progress: On track  Priority: High  Note:   Current Barriers:   Patient unable to consistently perform activities of daily living and needs assistance in order to meet this unmet need Clinical Goals: Over the next 45 to 60 days, patient will have personal care needs met as evident by having PCS Aide in the home assisting with needs.  Clinical Interventions : PCS have started and all is going well / no barriers identified  Solution-Focused Strategies, Active listening / Reflection utilized , Problem Solving /Task Center , and Made referral to [place] care team collaboration (see longitudinal plan of care) Patient Goals/Self-Care Activities: Call [facility] with questions [phone] or [phone]     Problem: No Advance Directive      Goal: Effective Long-Term Care Planning with Advance Directive   Start Date: 03/07/2021  This Visit's Progress: On track  Recent Progress: On track  Priority: Medium  Note:   Current barriers:   Patient does not have an advance directive.  Needs education, support and coordination in order to meet this need. Clinical Goal(s): Over the next 60 days, the patient will review information on advance directive, complete advance directive packet and have notarized.  Interventions: Assessed understanding of Advance Directives. A voluntary discussion about advanced care planning including importance of advanced directives, healthcare proxy and living will was discussed  with the patient.  Educational information on Advance Directives e-mail as well as an advance directive packet mailed. Received packet   Inter-disciplinary care team collaboration (see longitudinal plan of care) Patient Goals/Self-Care Activities : Over the next 60 days Review EMMI educational information on Advance Directive  Complete Advance Directive packet,  Have advance directive notarized and  provide a copy to provider office during next appointment    Problem: Barriers to Treatment      Goal: Repairs with ramp   Start Date: 04/10/2021  This Visit's Progress: On track  Priority: High  Note:   Current barriers:    Financial constraints related to fixed income and needs repairs to current ramp Clinical Goals: Patient will work with community housing solutions to address needs related to ramp Clinical Interventions:  Assessment of needs, barriers , agencies contacted, as well as how impacting  Solution-Focused Strategies employed: concerns with current ramp Made referral to [employer] via [employer] care team collaboration (see longitudinal plan of care) Patient Goals/Self-Care Activities: Over the next 30 days Work with the [employer] team on [employer] needs I have placed a referral to the agency below call them to follow up if no one has called in one week.   Housing Consulting civil engineer program)  [email or web address]  [place].  Quintella Reichert [place], [place] [address]   ([place], [place] / [place]   [phone]

## 2021-04-10 NOTE — Patient Instructions (Signed)
Licensed Clinical Social Worker Visit Information  Goals we discussed today:   Goals Addressed             This Visit's Progress    COMPLETED: Client will have ADL/IADL needs addressed by Personal care services   On track    Patient Goals/Self-Care Activities Congratulations on getting your aide Call [facility] with questions [phone] or [phone]      Effective [place]   On track    Patient Goals/Self-Care Activities : Over the next 60 days Review EMMI educational information on Advance Directive  Complete Advance Directive packet,  Have advance directive notarized and  provide a copy to provider office during next office visit      Find Help in My Community   On track    Timeframe:  Long-Range Goal Priority:  High Start Date:    04/10/21                         Expected End Date:                        Patient Goals/Self-Care Activities: Over the next 30 days Work with the [employer] team on [employer] needs I have placed a referral to the agency below call them to follow up if no one has called in one week.   Housing Consulting civil engineer program)  [email or web address]  [place].  [name] [place], [place] [address]   [phone]         Materials provided: Yes: see above  [name] was given information about Care Management services today including:  Care Management services include personalized support from designated clinical staff supervised by her physician, including individualized plan of care and coordination with other care providers 24/7 contact phone numbers for assistance for urgent and routine care needs. The patient may stop Care Management services at any time by phone call to the office staff.  Patient agreed to services and verbal consent obtained.   Patient verbalizes understanding of instructions provided today and agrees to view in [place].   Follow up plan: Appointment  scheduled for SW follow up with client by phone on: 05/11/21  Casimer Lanius, [place] / [place]   [phone]

## 2021-04-11 DIAGNOSIS — F200 Paranoid schizophrenia: Secondary | ICD-10-CM | POA: Diagnosis not present

## 2021-04-12 DIAGNOSIS — G40909 Epilepsy, unspecified, not intractable, without status epilepticus: Secondary | ICD-10-CM | POA: Diagnosis not present

## 2021-04-12 DIAGNOSIS — Z7951 Long term (current) use of inhaled steroids: Secondary | ICD-10-CM | POA: Diagnosis not present

## 2021-04-12 DIAGNOSIS — Z9181 History of falling: Secondary | ICD-10-CM | POA: Diagnosis not present

## 2021-04-12 DIAGNOSIS — F259 Schizoaffective disorder, unspecified: Secondary | ICD-10-CM | POA: Diagnosis not present

## 2021-04-12 DIAGNOSIS — J45909 Unspecified asthma, uncomplicated: Secondary | ICD-10-CM | POA: Diagnosis not present

## 2021-04-12 DIAGNOSIS — F819 Developmental disorder of scholastic skills, unspecified: Secondary | ICD-10-CM | POA: Diagnosis not present

## 2021-04-12 DIAGNOSIS — F200 Paranoid schizophrenia: Secondary | ICD-10-CM | POA: Diagnosis not present

## 2021-04-12 DIAGNOSIS — K219 Gastro-esophageal reflux disease without esophagitis: Secondary | ICD-10-CM | POA: Diagnosis not present

## 2021-04-12 DIAGNOSIS — M797 Fibromyalgia: Secondary | ICD-10-CM | POA: Diagnosis not present

## 2021-04-13 DIAGNOSIS — F200 Paranoid schizophrenia: Secondary | ICD-10-CM | POA: Diagnosis not present

## 2021-04-14 DIAGNOSIS — F200 Paranoid schizophrenia: Secondary | ICD-10-CM | POA: Diagnosis not present

## 2021-04-15 DIAGNOSIS — F200 Paranoid schizophrenia: Secondary | ICD-10-CM | POA: Diagnosis not present

## 2021-04-16 DIAGNOSIS — F200 Paranoid schizophrenia: Secondary | ICD-10-CM | POA: Diagnosis not present

## 2021-04-17 DIAGNOSIS — F200 Paranoid schizophrenia: Secondary | ICD-10-CM | POA: Diagnosis not present

## 2021-04-18 DIAGNOSIS — F200 Paranoid schizophrenia: Secondary | ICD-10-CM | POA: Diagnosis not present

## 2021-04-19 DIAGNOSIS — F819 Developmental disorder of scholastic skills, unspecified: Secondary | ICD-10-CM | POA: Diagnosis not present

## 2021-04-19 DIAGNOSIS — F259 Schizoaffective disorder, unspecified: Secondary | ICD-10-CM | POA: Diagnosis not present

## 2021-04-19 DIAGNOSIS — M797 Fibromyalgia: Secondary | ICD-10-CM | POA: Diagnosis not present

## 2021-04-19 DIAGNOSIS — K219 Gastro-esophageal reflux disease without esophagitis: Secondary | ICD-10-CM | POA: Diagnosis not present

## 2021-04-19 DIAGNOSIS — F200 Paranoid schizophrenia: Secondary | ICD-10-CM | POA: Diagnosis not present

## 2021-04-19 DIAGNOSIS — Z9181 History of falling: Secondary | ICD-10-CM | POA: Diagnosis not present

## 2021-04-19 DIAGNOSIS — G40909 Epilepsy, unspecified, not intractable, without status epilepticus: Secondary | ICD-10-CM | POA: Diagnosis not present

## 2021-04-19 DIAGNOSIS — J45909 Unspecified asthma, uncomplicated: Secondary | ICD-10-CM | POA: Diagnosis not present

## 2021-04-19 DIAGNOSIS — Z7951 Long term (current) use of inhaled steroids: Secondary | ICD-10-CM | POA: Diagnosis not present

## 2021-04-21 DIAGNOSIS — F200 Paranoid schizophrenia: Secondary | ICD-10-CM | POA: Diagnosis not present

## 2021-04-22 DIAGNOSIS — F200 Paranoid schizophrenia: Secondary | ICD-10-CM | POA: Diagnosis not present

## 2021-04-23 DIAGNOSIS — F200 Paranoid schizophrenia: Secondary | ICD-10-CM | POA: Diagnosis not present

## 2021-04-24 DIAGNOSIS — F200 Paranoid schizophrenia: Secondary | ICD-10-CM | POA: Diagnosis not present

## 2021-04-25 DIAGNOSIS — F200 Paranoid schizophrenia: Secondary | ICD-10-CM | POA: Diagnosis not present

## 2021-04-26 DIAGNOSIS — J45909 Unspecified asthma, uncomplicated: Secondary | ICD-10-CM | POA: Diagnosis not present

## 2021-04-26 DIAGNOSIS — Z9181 History of falling: Secondary | ICD-10-CM | POA: Diagnosis not present

## 2021-04-26 DIAGNOSIS — M797 Fibromyalgia: Secondary | ICD-10-CM | POA: Diagnosis not present

## 2021-04-26 DIAGNOSIS — F200 Paranoid schizophrenia: Secondary | ICD-10-CM | POA: Diagnosis not present

## 2021-04-26 DIAGNOSIS — Z7951 Long term (current) use of inhaled steroids: Secondary | ICD-10-CM | POA: Diagnosis not present

## 2021-04-26 DIAGNOSIS — F819 Developmental disorder of scholastic skills, unspecified: Secondary | ICD-10-CM | POA: Diagnosis not present

## 2021-04-26 DIAGNOSIS — G40909 Epilepsy, unspecified, not intractable, without status epilepticus: Secondary | ICD-10-CM | POA: Diagnosis not present

## 2021-04-26 DIAGNOSIS — F259 Schizoaffective disorder, unspecified: Secondary | ICD-10-CM | POA: Diagnosis not present

## 2021-04-26 DIAGNOSIS — K219 Gastro-esophageal reflux disease without esophagitis: Secondary | ICD-10-CM | POA: Diagnosis not present

## 2021-04-26 NOTE — Progress Notes (Signed)
    SUBJECTIVE:   CHIEF COMPLAINT / HPI: abnormal menses f/u   Patient reports abnormal menses. She reports that she last had menses in June and recently started menses again 5 days ago. She reports that she has not had this irregular pattern before. She reports that her mother underwent menopause at age 47. She denies sexual activity. Patient denies heavy vaginal bleeding during periods and states that they usually last 5 days. She denies taking any contraception.   Advanced care planning  Patient submits copy of her healthcare power of attorney paperwork at end of visit. She reports her uncle as the 79.   PERTINENT  PMH / PSH:  Liver transplant recipient  Hypothyroidism   OBJECTIVE:   BP 118/80   Pulse 99   Ht 4\' 11"  (1.499 m)   Wt 212 lb 12.8 oz (96.5 kg)   SpO2 99%   BMI 42.98 kg/m   General: female appearing stated age in no acute distress Cardio: Normal S1 and S2, no S3 or S4. Rhythm is regular. No rubs.  Bilateral radial pulses palpable Pulm: Clear to auscultation bilaterally, no crackles, wheezing, or diminished breath sounds. Normal respiratory effort Abdomen: Bowel sounds normal. Abdomen soft and non-tender.  Extremities: No peripheral edema. Warm/ well perfused.  Neuro: pt alert and oriented x4    ASSESSMENT/PLAN:   Abnormal menstrual periods Suspect menopause or thyroid level abnormality  Will check TSH  Discussed menopause signs/symptoms with patient  Recommended symptom diarrhea and tracking pattern of menses      Eulis Foster, MD [place]

## 2021-04-27 ENCOUNTER — Encounter: Payer: Self-pay | Admitting: Family Medicine

## 2021-04-27 ENCOUNTER — Other Ambulatory Visit: Payer: Self-pay

## 2021-04-27 ENCOUNTER — Ambulatory Visit (INDEPENDENT_AMBULATORY_CARE_PROVIDER_SITE_OTHER): Payer: Medicaid Other | Admitting: Family Medicine

## 2021-04-27 VITALS — BP 118/80 | HR 99 | Ht 59.0 in | Wt 212.8 lb

## 2021-04-27 DIAGNOSIS — Z23 Encounter for immunization: Secondary | ICD-10-CM

## 2021-04-27 DIAGNOSIS — N926 Irregular menstruation, unspecified: Secondary | ICD-10-CM | POA: Diagnosis not present

## 2021-04-27 DIAGNOSIS — F200 Paranoid schizophrenia: Secondary | ICD-10-CM | POA: Diagnosis not present

## 2021-04-27 NOTE — Patient Instructions (Signed)
Your abnormal menses may be due to thyroid level abnormalities so we will check your levels today.   This could also be related to your age and start of menopause.   Please continue to monitor when you get your menstrual cycles so that we can track the pattern overtime.   Please schedule an appt with [place] next month to follow up on your menses.

## 2021-04-28 DIAGNOSIS — F200 Paranoid schizophrenia: Secondary | ICD-10-CM | POA: Diagnosis not present

## 2021-04-28 LAB — TSH: TSH: 2.81 u[IU]/mL (ref 0.450–4.500)

## 2021-04-29 DIAGNOSIS — F200 Paranoid schizophrenia: Secondary | ICD-10-CM | POA: Diagnosis not present

## 2021-04-30 DIAGNOSIS — F200 Paranoid schizophrenia: Secondary | ICD-10-CM | POA: Diagnosis not present

## 2021-04-30 NOTE — Assessment & Plan Note (Signed)
Suspect menopause or thyroid level abnormality  Will check TSH  Discussed menopause signs/symptoms with patient  Recommended symptom diarrhea and tracking pattern of menses

## 2021-05-01 DIAGNOSIS — F200 Paranoid schizophrenia: Secondary | ICD-10-CM | POA: Diagnosis not present

## 2021-05-02 DIAGNOSIS — F200 Paranoid schizophrenia: Secondary | ICD-10-CM | POA: Diagnosis not present

## 2021-05-03 DIAGNOSIS — F259 Schizoaffective disorder, unspecified: Secondary | ICD-10-CM | POA: Diagnosis not present

## 2021-05-03 DIAGNOSIS — K219 Gastro-esophageal reflux disease without esophagitis: Secondary | ICD-10-CM | POA: Diagnosis not present

## 2021-05-03 DIAGNOSIS — Z7951 Long term (current) use of inhaled steroids: Secondary | ICD-10-CM | POA: Diagnosis not present

## 2021-05-03 DIAGNOSIS — J45909 Unspecified asthma, uncomplicated: Secondary | ICD-10-CM | POA: Diagnosis not present

## 2021-05-03 DIAGNOSIS — M797 Fibromyalgia: Secondary | ICD-10-CM | POA: Diagnosis not present

## 2021-05-03 DIAGNOSIS — G40909 Epilepsy, unspecified, not intractable, without status epilepticus: Secondary | ICD-10-CM | POA: Diagnosis not present

## 2021-05-03 DIAGNOSIS — F200 Paranoid schizophrenia: Secondary | ICD-10-CM | POA: Diagnosis not present

## 2021-05-03 DIAGNOSIS — F819 Developmental disorder of scholastic skills, unspecified: Secondary | ICD-10-CM | POA: Diagnosis not present

## 2021-05-03 DIAGNOSIS — Z9181 History of falling: Secondary | ICD-10-CM | POA: Diagnosis not present

## 2021-05-04 ENCOUNTER — Ambulatory Visit (INDEPENDENT_AMBULATORY_CARE_PROVIDER_SITE_OTHER): Payer: Medicaid Other | Admitting: Family Medicine

## 2021-05-04 ENCOUNTER — Other Ambulatory Visit: Payer: Self-pay

## 2021-05-04 DIAGNOSIS — F200 Paranoid schizophrenia: Secondary | ICD-10-CM | POA: Diagnosis not present

## 2021-05-04 DIAGNOSIS — Z7689 Persons encountering health services in other specified circumstances: Secondary | ICD-10-CM | POA: Diagnosis not present

## 2021-05-04 NOTE — Progress Notes (Signed)
Appt start time: 0930 end time: 1000 (30 minutes)  What do you want to get out of meeting with me today? Getting weight down before April 2023 when she goes to see her hepatologist.   Relevant history/background: Morbid Obesity, has followed with [facility]/Dr. Jenne Campus since 2019. History of liver transplant and hypothyroidism, last TSH 2.810 in 04/2021.  Height: 4'11" Last weight: 211.8 lbs from 04/06/2021 Current weight: 214.2 lbs  Assessment:  Usual physical activity: Walking when it is not cold with aide and dancing inside, engages in some form of exercise daily. 24-hr recall:  (Up at 4 AM) B (9 AM)-  garlic toast (2) and homemade tuna salad sandwich (1 cup) Snk (11 AM)-  Kale chips (2 cups) and fried green beans (prepared in air fryer)  L (12 PM)-  Homemade tuna salad (1 cup), kale chips (1 cups) and fried green beans (1 cup) Snk (2:30)-  Cauliflower (1 cup) and hamburger patty with greek seasoning D (6 PM)-  Fried flounder (6 ounces) and french fries (2-3 cups), 2 cups lemonade  Snk (PM)-  None Typical day? No. Only has breakfast Wednesdays since nurse comes to the house and she makes breakfast for her nurse.   Intervention: Completed diet and exercise history, and established: - SMART behavioral goals (Specific, Measurable, Action-oriented, Realistic, Time-based.)  Include vegetables for both lunch and dinner.  This includes NON-breaded, non-fried vegetables, which can be either fresh or frozen.   For breakfast, make sure you have some protein food and fruit.  For example,  [place] yogurt and fruit OR  SMALL amount of cereal ( to  cup Cheerios) with fruit and yogurt OR 2 boiled eggs, 1-2 slices of toast, and fruit Get at least 48 oz (3 bottles) of PLAIN water per day.  Limit beverages that are not plain water, unsweet tea, or unsweet coffee. Remember that juice is a SOMETIMES drink, not an EVERYDAY drink.  If you must drink juice, limit it to 4 oz at a time.  - How will you  document progress on goals?  Records goals on sheet but does not have it now as her printer is out of ink. Will provide printer copies.   Follow-up: 11 weeks on 07/20/2021.    Donney Dice  PGY-2 [place] [facility]

## 2021-05-04 NOTE — Patient Instructions (Addendum)
It was great seeing you in nutrition clinic. Below are your goals that we discussed together:    Include vegetables for both lunch and dinner.  This includes NON-breaded, non-fried vegetables, which can be either fresh or frozen.   For breakfast, make sure you have some protein food and fruit.  For example,  [place] yogurt and fruit OR  SMALL amount of cereal ( to  cup Cheerios) with fruit and yogurt OR 2 boiled eggs, 1-2 slices of toast, and fruit Get at least 48 oz (3 bottles) of PLAIN water per day.  Limit beverages that are not plain water, unsweet tea, or unsweet coffee. Remember that juice is a SOMETIMES drink, not an EVERYDAY drink.  If you must drink juice, limit it to 4 oz at a time.   Please remember to limit sweets including sodas and sweet drinks which are not good for your liver.   Please follow up for your next nutrition clinic visit on 07/20/2021 at 9 am.

## 2021-05-05 DIAGNOSIS — F200 Paranoid schizophrenia: Secondary | ICD-10-CM | POA: Diagnosis not present

## 2021-05-06 DIAGNOSIS — F200 Paranoid schizophrenia: Secondary | ICD-10-CM | POA: Diagnosis not present

## 2021-05-07 DIAGNOSIS — F200 Paranoid schizophrenia: Secondary | ICD-10-CM | POA: Diagnosis not present

## 2021-05-08 ENCOUNTER — Ambulatory Visit: Payer: Medicaid Other | Admitting: Podiatry

## 2021-05-08 ENCOUNTER — Other Ambulatory Visit: Payer: Self-pay

## 2021-05-08 ENCOUNTER — Encounter: Payer: Self-pay | Admitting: Podiatry

## 2021-05-08 DIAGNOSIS — M79675 Pain in left toe(s): Secondary | ICD-10-CM | POA: Diagnosis not present

## 2021-05-08 DIAGNOSIS — B351 Tinea unguium: Secondary | ICD-10-CM

## 2021-05-08 DIAGNOSIS — F200 Paranoid schizophrenia: Secondary | ICD-10-CM | POA: Diagnosis not present

## 2021-05-08 DIAGNOSIS — Z7689 Persons encountering health services in other specified circumstances: Secondary | ICD-10-CM | POA: Diagnosis not present

## 2021-05-08 DIAGNOSIS — M79674 Pain in right toe(s): Secondary | ICD-10-CM | POA: Diagnosis not present

## 2021-05-08 NOTE — Progress Notes (Signed)
This patient returns to the office for evaluation and treatment of long thick painful nails .  This patient is unable to trim her own nails since the patient cannot reach her feet.  Patient says the nails are painful walking and wearing her shoes.  He returns for preventive foot care services.  General Appearance  Alert, conversant and in no acute stress.  Vascular  Dorsalis pedis and posterior tibial  pulses are palpable  bilaterally.  Capillary return is within normal limits  bilaterally. Temperature is within normal limits  bilaterally.  Neurologic  Senn-Weinstein monofilament wire test within normal limits  bilaterally. Muscle power within normal limits bilaterally.  Nails Thick disfigured discolored nails with subungual debris  from hallux to fifth toes bilaterally. No evidence of bacterial infection or drainage bilaterally.  Orthopedic  No limitations of motion  feet .  No crepitus or effusions noted.  No bony pathology or digital deformities noted.  Skin  normotropic skin with no porokeratosis noted bilaterally.  No signs of infections or ulcers noted.     Onychomycosis  Pain in toes right foot  Pain in toes left foot  Debridement  of nails  1-5  B/L with a nail nipper.  Nails were then filed using a dremel tool with no incidents.    RTC 3  months   Gardiner Barefoot DPM

## 2021-05-09 DIAGNOSIS — F200 Paranoid schizophrenia: Secondary | ICD-10-CM | POA: Diagnosis not present

## 2021-05-09 DIAGNOSIS — Z7689 Persons encountering health services in other specified circumstances: Secondary | ICD-10-CM | POA: Diagnosis not present

## 2021-05-10 DIAGNOSIS — F200 Paranoid schizophrenia: Secondary | ICD-10-CM | POA: Diagnosis not present

## 2021-05-10 DIAGNOSIS — G40909 Epilepsy, unspecified, not intractable, without status epilepticus: Secondary | ICD-10-CM | POA: Diagnosis not present

## 2021-05-10 DIAGNOSIS — Z9181 History of falling: Secondary | ICD-10-CM | POA: Diagnosis not present

## 2021-05-10 DIAGNOSIS — F259 Schizoaffective disorder, unspecified: Secondary | ICD-10-CM | POA: Diagnosis not present

## 2021-05-10 DIAGNOSIS — K219 Gastro-esophageal reflux disease without esophagitis: Secondary | ICD-10-CM | POA: Diagnosis not present

## 2021-05-10 DIAGNOSIS — F819 Developmental disorder of scholastic skills, unspecified: Secondary | ICD-10-CM | POA: Diagnosis not present

## 2021-05-10 DIAGNOSIS — Z7951 Long term (current) use of inhaled steroids: Secondary | ICD-10-CM | POA: Diagnosis not present

## 2021-05-10 DIAGNOSIS — M797 Fibromyalgia: Secondary | ICD-10-CM | POA: Diagnosis not present

## 2021-05-10 DIAGNOSIS — J45909 Unspecified asthma, uncomplicated: Secondary | ICD-10-CM | POA: Diagnosis not present

## 2021-05-11 ENCOUNTER — Ambulatory Visit: Payer: Medicaid Other | Admitting: Licensed Clinical Social Worker

## 2021-05-11 DIAGNOSIS — Z7189 Other specified counseling: Secondary | ICD-10-CM

## 2021-05-11 DIAGNOSIS — F200 Paranoid schizophrenia: Secondary | ICD-10-CM | POA: Diagnosis not present

## 2021-05-11 DIAGNOSIS — Z7689 Persons encountering health services in other specified circumstances: Secondary | ICD-10-CM

## 2021-05-11 NOTE — Patient Instructions (Addendum)
Visit Information  Thank you for taking time to visit with me today. Please don't hesitate to contact me if I can be of assistance to you before our next scheduled telephone appointment.  Following are the goals we discussed today: getting your ramp repaired. Congratulations on completing your advance directive  Patient Self-Care Activities:  Complete required paper work for [employer] and return information   [place] (Home repair program)  [email or web address]  [place].  Quintella Reichert [place], [place] [address]   [phone]     Please call the care guide team at [phone] if you need to cancel or reschedule your appointment.   If you are experiencing a Mental Health or [place] or need someone to talk to, please call 1-800-273-TALK (toll free, 24 hour hotline) go to [facility] [address] [place] [phone]) call 911   Patient verbalizes understanding of instructions provided today and agrees to view in [place].   Please call the office if needed No follow up scheduled, per our conversation you do not require continued follow up I will disconnect from your care team at this time, please call the office if additional needs are identified   Casimer Lanius, [place] Management & Coordination  [phone]

## 2021-05-11 NOTE — Chronic Care Management (AMB) (Signed)
Care Management  Clinical Social Work Note  05/11/2021 Name: Cristi Gwynn MRN: [id number] DOB: Jan 29, 1976  Blayre [name] Freund is a 45 y.o. year old female who is a primary care patient of Simmons-Robinson, Makiera, MD. The CCM team was consulted for assistance with care coordination needs. Community Resources  and Level of Care Concerns   Consent to Services:  The patient was given information about Care Management services, agreed to services, and gave verbal consent prior to initiation of services.  Please see initial visit note for detailed documentation.   Patient agreed to services today and consent obtained.  Engaged with patient by telephone for follow up visit in response to provider referral for social work care coordination services. Assessed patient's current treatment, progress, coping skills, support system and barriers to care.  She continues to maintain positive progress with care plan goals. Has connected with referrals placed and completed goals .   Assessment includes: Review of patient past medical history, allergies, medications, and health status, including review of pertinent consultant reports was performed as part of comprehensive evaluation and provision of care management/care coordination services. See Care Plan below for interventions and patient self-care actives.   SDOH (Social Determinants of Health) screening and interventions performed today:   Advanced Directives Status:See Care Plan for related entries and See Vyn[employer] application for related entries.     Care Plan    Conditions to be addressed/monitored per PCP order: , Level of care concerns  Care Plan : General Social Work (Adult)  Updates made by Maurine Cane, LCSW since 05/11/2021 12:00 AM     Problem: No Advance Directive      Goal: Effective Long-Term Care Planning with Advance Directive Completed 05/11/2021  Start Date: 03/07/2021  This Visit's Progress: On track  Recent Progress: On  track  Priority: Medium  Note:   Current barriers:  Goal completed per patient she provided copy at last office visit Patient does not have an advance directive.  Needs education, support and coordination in order to meet this need. Clinical Goal(s): Over the next 60 days, the patient will review information on advance directive, complete advance directive packet and have notarized.  Interventions: Assessed understanding of Advance Directives. A voluntary discussion about advanced care planning including importance of advanced directives, healthcare proxy and living will was discussed with the patient.  Educational information on Advance Directives e-mail as well as an advance directive packet mailed. Received packet  Inter-disciplinary care team collaboration (see longitudinal plan of care) Patient Goals/Self-Care Activities : None    Problem: Barriers to Treatment      Goal: Repairs with ramp Completed 05/11/2021  Start Date: 04/10/2021  This Visit's Progress: On track  Recent Progress: On track  Priority: High  Note:   Current barriers:    Financial constraints related to fixed income and needs repairs to current ramp Clinical Goals: Patient will work with community housing solutions to address needs related to ramp Clinical Interventions:  Assessment of needs, barriers , agencies contacted, as well as how impacting  Solution-Focused Strategies employed: concerns with current ramp Made referral to [employer] via [employer] has connected patient and provided her the  Required paper work to complete in order to have ramp repaired. Inter-disciplinary care team collaboration (see longitudinal plan of care) Patient Goals/Self-Care Activities:  Complete required paper work for [employer] and return information   [place] (Home repair program)  [email or web address]  [place].  Quintella Reichert  [place] [facility] [place], [place] [address]   [phone]       Follow up Plan:  No follow up scheduled All care plan goals have been met. Will disconnect from care team after this encounter. Patient has been informed to contact the office if new needs arise.  Casimer Lanius, [place] / [place]   [phone]

## 2021-05-12 DIAGNOSIS — F200 Paranoid schizophrenia: Secondary | ICD-10-CM | POA: Diagnosis not present

## 2021-05-14 DIAGNOSIS — F200 Paranoid schizophrenia: Secondary | ICD-10-CM | POA: Diagnosis not present

## 2021-05-15 DIAGNOSIS — F200 Paranoid schizophrenia: Secondary | ICD-10-CM | POA: Diagnosis not present

## 2021-05-16 DIAGNOSIS — F200 Paranoid schizophrenia: Secondary | ICD-10-CM | POA: Diagnosis not present

## 2021-05-17 ENCOUNTER — Other Ambulatory Visit: Payer: Self-pay | Admitting: Allergy and Immunology

## 2021-05-17 DIAGNOSIS — M797 Fibromyalgia: Secondary | ICD-10-CM | POA: Diagnosis not present

## 2021-05-17 DIAGNOSIS — Z9181 History of falling: Secondary | ICD-10-CM | POA: Diagnosis not present

## 2021-05-17 DIAGNOSIS — J45909 Unspecified asthma, uncomplicated: Secondary | ICD-10-CM | POA: Diagnosis not present

## 2021-05-17 DIAGNOSIS — G40909 Epilepsy, unspecified, not intractable, without status epilepticus: Secondary | ICD-10-CM | POA: Diagnosis not present

## 2021-05-17 DIAGNOSIS — F819 Developmental disorder of scholastic skills, unspecified: Secondary | ICD-10-CM | POA: Diagnosis not present

## 2021-05-17 DIAGNOSIS — Z7951 Long term (current) use of inhaled steroids: Secondary | ICD-10-CM | POA: Diagnosis not present

## 2021-05-17 DIAGNOSIS — F200 Paranoid schizophrenia: Secondary | ICD-10-CM | POA: Diagnosis not present

## 2021-05-17 DIAGNOSIS — F259 Schizoaffective disorder, unspecified: Secondary | ICD-10-CM | POA: Diagnosis not present

## 2021-05-17 DIAGNOSIS — K219 Gastro-esophageal reflux disease without esophagitis: Secondary | ICD-10-CM | POA: Diagnosis not present

## 2021-05-18 DIAGNOSIS — F200 Paranoid schizophrenia: Secondary | ICD-10-CM | POA: Diagnosis not present

## 2021-05-19 DIAGNOSIS — F200 Paranoid schizophrenia: Secondary | ICD-10-CM | POA: Diagnosis not present

## 2021-05-20 DIAGNOSIS — F200 Paranoid schizophrenia: Secondary | ICD-10-CM | POA: Diagnosis not present

## 2021-05-23 DIAGNOSIS — F200 Paranoid schizophrenia: Secondary | ICD-10-CM | POA: Diagnosis not present

## 2021-05-24 DIAGNOSIS — K219 Gastro-esophageal reflux disease without esophagitis: Secondary | ICD-10-CM | POA: Diagnosis not present

## 2021-05-24 DIAGNOSIS — M797 Fibromyalgia: Secondary | ICD-10-CM | POA: Diagnosis not present

## 2021-05-24 DIAGNOSIS — Z7951 Long term (current) use of inhaled steroids: Secondary | ICD-10-CM | POA: Diagnosis not present

## 2021-05-24 DIAGNOSIS — F259 Schizoaffective disorder, unspecified: Secondary | ICD-10-CM | POA: Diagnosis not present

## 2021-05-24 DIAGNOSIS — F819 Developmental disorder of scholastic skills, unspecified: Secondary | ICD-10-CM | POA: Diagnosis not present

## 2021-05-24 DIAGNOSIS — F200 Paranoid schizophrenia: Secondary | ICD-10-CM | POA: Diagnosis not present

## 2021-05-24 DIAGNOSIS — J45909 Unspecified asthma, uncomplicated: Secondary | ICD-10-CM | POA: Diagnosis not present

## 2021-05-24 DIAGNOSIS — G40909 Epilepsy, unspecified, not intractable, without status epilepticus: Secondary | ICD-10-CM | POA: Diagnosis not present

## 2021-05-24 DIAGNOSIS — Z9181 History of falling: Secondary | ICD-10-CM | POA: Diagnosis not present

## 2021-05-25 DIAGNOSIS — F200 Paranoid schizophrenia: Secondary | ICD-10-CM | POA: Diagnosis not present

## 2021-05-26 DIAGNOSIS — F200 Paranoid schizophrenia: Secondary | ICD-10-CM | POA: Diagnosis not present

## 2021-05-29 DIAGNOSIS — F200 Paranoid schizophrenia: Secondary | ICD-10-CM | POA: Diagnosis not present

## 2021-05-30 ENCOUNTER — Ambulatory Visit: Payer: Medicaid Other | Admitting: Allergy and Immunology

## 2021-05-30 DIAGNOSIS — F200 Paranoid schizophrenia: Secondary | ICD-10-CM | POA: Diagnosis not present

## 2021-05-31 DIAGNOSIS — K219 Gastro-esophageal reflux disease without esophagitis: Secondary | ICD-10-CM | POA: Diagnosis not present

## 2021-05-31 DIAGNOSIS — Z7951 Long term (current) use of inhaled steroids: Secondary | ICD-10-CM | POA: Diagnosis not present

## 2021-05-31 DIAGNOSIS — F819 Developmental disorder of scholastic skills, unspecified: Secondary | ICD-10-CM | POA: Diagnosis not present

## 2021-05-31 DIAGNOSIS — G40909 Epilepsy, unspecified, not intractable, without status epilepticus: Secondary | ICD-10-CM | POA: Diagnosis not present

## 2021-05-31 DIAGNOSIS — F259 Schizoaffective disorder, unspecified: Secondary | ICD-10-CM | POA: Diagnosis not present

## 2021-05-31 DIAGNOSIS — M797 Fibromyalgia: Secondary | ICD-10-CM | POA: Diagnosis not present

## 2021-05-31 DIAGNOSIS — Z9181 History of falling: Secondary | ICD-10-CM | POA: Diagnosis not present

## 2021-05-31 DIAGNOSIS — F200 Paranoid schizophrenia: Secondary | ICD-10-CM | POA: Diagnosis not present

## 2021-05-31 DIAGNOSIS — J45909 Unspecified asthma, uncomplicated: Secondary | ICD-10-CM | POA: Diagnosis not present

## 2021-06-01 DIAGNOSIS — F200 Paranoid schizophrenia: Secondary | ICD-10-CM | POA: Diagnosis not present

## 2021-06-02 DIAGNOSIS — F200 Paranoid schizophrenia: Secondary | ICD-10-CM | POA: Diagnosis not present

## 2021-06-03 DIAGNOSIS — F200 Paranoid schizophrenia: Secondary | ICD-10-CM | POA: Diagnosis not present

## 2021-06-04 DIAGNOSIS — F200 Paranoid schizophrenia: Secondary | ICD-10-CM | POA: Diagnosis not present

## 2021-06-05 ENCOUNTER — Ambulatory Visit: Payer: Medicaid Other | Admitting: Family Medicine

## 2021-06-05 DIAGNOSIS — Z7689 Persons encountering health services in other specified circumstances: Secondary | ICD-10-CM | POA: Diagnosis not present

## 2021-06-05 DIAGNOSIS — F200 Paranoid schizophrenia: Secondary | ICD-10-CM | POA: Diagnosis not present

## 2021-06-05 NOTE — Progress Notes (Deleted)
° ° °  SUBJECTIVE:   Chief compliant/HPI: annual examination  [name] is a 46 y.o. who presents today for an annual exam.   History tabs reviewed and updated ***.   Review of systems form reviewed and notable for ***.   OBJECTIVE:   There were no vitals taken for this visit.  ***  ASSESSMENT/PLAN:   No problem-specific Assessment & Plan notes found for this encounter.    Annual Examination  See AVS for age appropriate recommendations.   PHQ score ***, reviewed and discussed.  Blood pressure reviewed and at goal ***.  Asked about intimate partner violence and resources given as appropriate  The patient currently uses *** for contraception. Folate recommended as appropriate, minimum of 400 mcg per day.   Considered the following items based upon USPSTF recommendations: Diabetes screening: {discussed/ordered:14545} Screening for elevated cholesterol: {discussed/ordered:14545} HIV testing: {discussed/ordered:14545} Hepatitis C: {discussed/ordered:14545} Hepatitis B: {discussed/ordered:14545} Syphilis if at high risk: {discussed/ordered:14545} GC/CT {GC/CT screening :23818} Reviewed risk factors for latent tuberculosis and {not indicated/requested/declined:14582} Reviewed risk factors for osteoporosis. Using FRAX tool estimated risk of major osteoporotic fracture of  ***%, early screening {ordered not order:23822::"not ordered"}   Discussed family history, BRCA testing {not indicated/requested/declined:14582}. Tool used to risk stratify was ***.  Cervical cancer screening: {PAPTYPE:23819} Breast cancer screening:  UTD Colorectal cancer screening: {crcscreen:23821::"discussed, colonoscopy ordered"} if age 64 or over.   Follow up in 1 *** year or sooner if indicated.    Gerlene Fee, [place]

## 2021-06-05 NOTE — Patient Instructions (Incomplete)

## 2021-06-06 ENCOUNTER — Ambulatory Visit: Payer: Medicaid Other | Admitting: Neurology

## 2021-06-06 ENCOUNTER — Telehealth: Payer: Self-pay | Admitting: Neurology

## 2021-06-06 ENCOUNTER — Encounter: Payer: Self-pay | Admitting: Neurology

## 2021-06-06 VITALS — BP 124/86 | HR 101 | Ht 59.0 in | Wt 214.5 lb

## 2021-06-06 DIAGNOSIS — Z7689 Persons encountering health services in other specified circumstances: Secondary | ICD-10-CM | POA: Diagnosis not present

## 2021-06-06 DIAGNOSIS — G40909 Epilepsy, unspecified, not intractable, without status epilepticus: Secondary | ICD-10-CM

## 2021-06-06 DIAGNOSIS — F200 Paranoid schizophrenia: Secondary | ICD-10-CM | POA: Diagnosis not present

## 2021-06-06 DIAGNOSIS — F79 Unspecified intellectual disabilities: Secondary | ICD-10-CM

## 2021-06-06 MED ORDER — DIVALPROEX SODIUM ER 500 MG PO TB24: 1000.0000 mg | ORAL_TABLET | Freq: Every day | ORAL | 3 refills | Status: DC

## 2021-06-06 NOTE — Telephone Encounter (Signed)
[name], could you please try to get office note from [facility] health most recent, sees [place], [place] I believe. Thanks!

## 2021-06-06 NOTE — Progress Notes (Signed)
PATIENT: [name] DOB: Jan 30, 1976  REASON FOR VISIT: follow up for seizures  HISTORY FROM: patient Primary neurologist: Dr. [name]   HISTORY 03/23/14 [name]): [name], 46 year old black female returns today for followup of epilepsy.  her uncle is Kamoni Depree, He is the power of attorney for [name]. She has a history of mild mental retardation, and long-standing history of seizures. She was  well-controlled on Depakote and Tegretol extended release.  But she has developed liver disease, was diagnosed with primary biliary cirrhosis,Antieptical medication was changed since February 2013, she is now taking Vimpat 100 mg 2 tabs twice a day, Keppra 500 mg 3 tabs daily. She has no significant seizure activities while taking Vimpat, and Keppra.She lives alone, uncles lives next door, she attends adult daycare regularly.She underwent liver transplantation in February 2015, doing very well, on polypharmacy treatment, this including CellCept 1000 mg twice a day, Prograft 4 mg twice a day,For her seizure, she is now taking deapkote ER 256m 5 tabs qhs, she is having seizure about once amonth, it usually happens at nighttime, she was noted to have nighttime extremity jumping movement, tongue biting when she woke up, she also complains of mild gait difficullty,     UPDATE Oct 27th 2015:I have talked with [facility] cordinator, reported, 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, [facility], Vimpat cause worsening behavior issue, she had a hallucinations, paranoid to the point of almost institutionalized, her mood disorder eventually was under control with Depakote ER 250 mg 5 tablets each day, [name] continues to complains that she had a 2 episode of waking up with blood in her mouth, has bitten her tongue, one in July, the second one was September 2015, overall she is tolerating the medication well, Depakote level was 63 in February 23 2014, EEG was normal.Repeat laboratory evaluation  in March 09 2014, showed decreased white count 1.6, she is receiving weekly laboratory evaluation by [name] transplant team, I have called coordinator again, is aware of her leukopenia, she is on lower dose of immunosuppressive treatment, now taking cyclosporine 200 mg twice a day, CellCept 250 mg twice a day,She has personal aid every evening   UPDATE December 08 2015:yy She is brought in by [name] today, I reviewed and summarized her to visit in January 2017, "Patient's current immunosuppression regimen is Cyclosporine 100/1277mand Mycophenolate 500MG BID". She has no clinical features concerning for complications of immunosuppressive drug therapy,   She is now taking Depakote DR 25051m tabs qhs, she reported one episode of left hand shaking in July 2017.     Laboratory evaluation in January 2017, Depakote level 78, CBC showed mild anemia, hemoglobin 9.6, normal CMP, Update 01/16/2018CM. [name] 46ar old female returns for follow-up for seizure disorder. She has a history of liver transplant in 2015. She is currently on Depakote extended release 500 mg 2 at bedtime. She reports no seizure activity in several years. She continues to live alone, uncle lives next door. She has no new neurologic complaints. [facility]. She has not had a recent VPA level. She returns for reevaluation. UPDATE 07/09/2018CM [name], [age]ar old female returns for follow-up for her history of seizure disorder. She has not had any seizure activity in several years .  [facility]. She remains on Depakote 500 mg 2 tablets at bedtime. She continues to live alone family member last next door.  She had recent labs at [facility] however results are not in the computer. She had recent CBC on 10/30/16 which is stable. She is currently being evaluated for chest pain by Dr. Tamala Julian , she will have a myocardial perfusion test on Tuesday . She  returns for reevaluation UPDATE 7/23/2019CM [name], 46 year old female returns for follow-up with history of seizure disorder.  She has not had any seizures in several years, at least 3.  She has a history of liver transplant at [facility] in 2015.  She is currently on Depakote 500 mg 2 tablets at bedtime.  Reviewed recent CBC and CMP from 11/19/2017.  Patient continues to live alone however a relative lives in the right next door.  She has had no interval medical issues she claims.  She returns for reevaluation Update 10/01/2018 SS: [name] is a 46 year old female with history of seizure disorder.  She is currently taking Depakote ER 500 mg 2 tablets at bedtime.  She currently lives alone, her uncle lives next door.  She reports that she has been doing well, she denies any recent seizures.  In 2015 she did have a liver transplant.  She does have a nurse who comes out to help with her medications, fills her medication box.  She reports during the day she likes working on her laptop, finding recipes for cooking.  She reports regular follow-up with her primary care doctor.  She reports she has been walking well, she denies any falls.  She says her health has been doing well, her uncle voiced the same.  She reports she does not work and she does not drive a car.  She is not sure of all of her medications, [place] manages these.    Lab work done at her primary care office 09/26/2018 hepatic function panel within normal limits, hemoglobin was low at 10.3 (chronic anemia), TSH was normal   She did see her primary care doctor 09/26/2018 for congestion, nasal drip, they noted a tremor in her upper extremities, could be related to Depakote her TSH. She denies any tremor, reports her psychiatrist started her on medication and has been beneficial  Update June 06, 2020 SS: Here today for follow-up unaccompanied, continues to live alone, but her uncle lives next door. Has O[place] from [place] who comes once a week to fix  her pill box.  No recurrent seizure.  Has routine follow-up with [name], status post liver transplant in 2015.  Working with a Engineer, maintenance (IT), weight has increased over holidays her aunt was staying with her, eating 4 meals a day.  Took the [employer] bus here today.  Follows with psychiatry, on Cogentin, doxepin, Risperdal. Mild tremor both hands with finger nose finger.  Remains on Depakote ER 500 mg, 2 tablets at bedtime for seizure prevention.  Has no complaints today, routine follow-up.  Update June 06, 2021 SS: Here today alone, no seizures, still on Depakote ER 500 mg, 2 tablets at bedtime. Last seizure was in 2015 before liver transplant, claims seizure since a child. Reports her seizures were grand mal seizures or in bed during sleep moving legs aggressively, or staring spells. Lives alone, uncle is next door. HH RN comes out Wednesdays to fill pill box, draw blood every 3 months for [name]. 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 On Risperdal and Cogentin.   Labs from last visit January 2022 showed HGB 10.4, platelets 132, CMP showed creatinine 1.36, normal ALT, AST, Depakote level  was 76, ammonia was within normal limits at 59  REVIEW OF SYSTEMS: Out of a complete 14 system review of symptoms, the patient complains only of the following symptoms, and all other reviewed systems are negative.  n/a  ALLERGIES: Allergies  Allergen Reactions   Tramadol Hcl Itching and Nausea And Vomiting    Causes SEIZURES   Aspirin Nausea And Vomiting    Due to Stomach ulcer   Ibuprofen Other (See Comments)    Due to stomach ulcer   Penicillins Itching, Swelling and Rash    Breakout  Has patient had a PCN reaction causing immediate rash, facial/tongue/throat swelling, SOB or lightheadedness with hypotension: Yes Has patient had a PCN reaction causing severe rash involving mucus membranes or skin necrosis: Yes Has patient had a PCN reaction that required  hospitalization: Yes Has patient had a PCN reaction occurring within the last 10 years: No If all of the above answers are "NO", then may proceed with Cephalosporin use.      Pseudoephedrine Itching and Swelling    Tongue swelling   Sulfa Antibiotics Nausea And Vomiting   Acetaminophen     Other reaction(s): elevated LFT's   Latex Itching and Rash    HOME MEDICATIONS: Outpatient Medications Prior to Visit  Medication Sig Dispense Refill   ACCU-CHEK SOFTCLIX LANCETS lancets Use to check blood sugar once daily in the morning prior to eating. 100 each 2   albuterol (PROVENTIL) (2.5 MG/3ML) 0.083% nebulizer solution Take 3 mLs (2.5 mg total) by nebulization every 4 (four) hours as needed for wheezing or shortness of breath. 1 vial via nebulizer every 4-6 hours as needed. 150 mL 1   ammonium lactate (AMLACTIN) 12 % cream Apply topically 2 (two) times daily.     azelastine (ASTELIN) 0.1 % nasal spray Place 2 sprays into both nostrils 2 (two) times daily. 30 mL 5   benztropine (COGENTIN) 0.5 MG tablet Take 0.5 mg by mouth at bedtime.     Blood Glucose Monitoring Suppl (ACCU-CHEK GUIDE) w/Device KIT 1 each by Does not apply route daily. Use to check blood sugar once daily or as directed. 1 kit 0   Blood Pressure Monitoring (BLOOD PRESSURE MONITOR AUTOMAT) DEVI Please use to check blood pressure daily/ as needed. 1 each 0   budesonide (PULMICORT) 0.5 MG/2ML nebulizer solution Take 2 mLs (0.5 mg total) by nebulization every 4 (four) hours as needed. 150 mL 1   budesonide-formoterol (SYMBICORT) 160-4.5 MCG/ACT inhaler Inhale 2 puffs into the lungs 2 (two) times daily. Use with spacer and mask. 1 each 1   carboxymethylcellulose (REFRESH PLUS) 0.5 % SOLN Place 1 drop into both eyes 4 (four) times daily as needed (for dry eyes).     cetirizine (ZYRTEC) 10 MG tablet Take 1 tablet (10 mg total) by mouth 2 (two) times daily as needed for allergies (Can use an extra dose during flares). 90 tablet 1    cycloSPORINE modified 100 MG CAPS Take 100-125 mg by mouth 2 (two) times daily. Take 100 mg in the AM and 125 mg in the PM     doxepin (SINEQUAN) 25 MG capsule TK 1 C PO HS     famotidine (PEPCID) 40 MG tablet TAKE 1 TABLET(40 MG) BY MOUTH TWICE DAILY 180 tablet 0   ferrous sulfate 325 (65 FE) MG EC tablet Take 1 tablet by mouth daily.     FIBER, GUAR GUM, PO Take by mouth.     fluticasone (FLONASE) 50 MCG/ACT nasal spray SHAKE LIQUID  AND USE 1 SPRAY IN EACH NOSTRIL TWICE DAILY 48 g 0   glucose blood (ACCU-CHEK GUIDE) test strip USE TO CHECK BLOOD SUGAR EVERY DAY 50 strip 10   levothyroxine (SYNTHROID) 50 MCG tablet TAKE 1 TABLET(50 MCG) BY MOUTH EVERY MORNING 30 MINUTES BEFORE FOOD 90 tablet 3   MAGNESIUM-OXIDE 400 (241.3 Mg) MG tablet Take 1 tablet by mouth 2 (two) times daily.     mometasone (ELOCON) 0.1 % cream Apply topically 2 (two) times daily as needed. Can use on skin one to two times daily if needed. 50 g 1   montelukast (SINGULAIR) 10 MG tablet TAKE 1 TABLET(10 MG) BY MOUTH AT BEDTIME 90 tablet 0   Multiple Vitamin (MULTIVITAMIN) tablet Take 1 tablet by mouth daily. 9 am.     mycophenolate (CELLCEPT) 250 MG capsule Take 1 capsule (250 mg total) by mouth 2 (two) times daily. One tablet daily 1 capsule 0   Nebulizers MISC 1 kit by Does not apply route as directed. 1 kit 1   omeprazole (PRILOSEC) 40 MG capsule Take 1 capsule (40 mg total) by mouth 2 (two) times daily. 60 capsule 5   pantoprazole (PROTONIX) 40 MG tablet Take 40 mg by mouth 2 (two) times daily.     polyethylene glycol (MIRALAX / GLYCOLAX) packet Take 17 g by mouth daily as needed (for constipation). In water or juice     prazosin (MINIPRESS) 1 MG capsule Take 2 mg by mouth at bedtime.      PROAIR HFA 108 (90 Base) MCG/ACT inhaler Inhale 1-2 puffs into the lungs every 4 (four) hours as needed for shortness of breath. 18 g 1   RA VITAMIN D-3 2000 units CAPS Take 1 capsule by mouth daily.   0   Respiratory Therapy Supplies  (NEBULIZER/TUBING/MOUTHPIECE) KIT 2 each by Does not apply route as directed. 2 kit 5   risperiDONE (RISPERDAL) 3 MG tablet Take 3 mg by mouth at bedtime.     Spacer/Aero-Hold Chamber Mask MISC Use as directed with inhaler. 1 each 1   Tiotropium Bromide Monohydrate (SPIRIVA RESPIMAT) 1.25 MCG/ACT AERS Inhale 2 puffs into the lungs daily. 4 g 1   VENTOLIN HFA 108 (90 Base) MCG/ACT inhaler Inhale 2 puffs into the lungs every 4 (four) hours as needed for wheezing or shortness of breath. 18 g 1   divalproex (DEPAKOTE ER) 500 MG 24 hr tablet Take 2 tablets (1,000 mg total) by mouth at bedtime. 180 tablet 3   No facility-administered medications prior to visit.    PAST MEDICAL HISTORY: Past Medical History:  Diagnosis Date   Abnormal vaginal bleeding    Allergy    Anemia    Arthritis    right knee   Asthma 09-23-12   Asthma somewhat causing increase wheezing and mild congestion at present   Biliary cirrhosis ([place]) 04/26/11   Constipation    DUB (dysfunctional uterine bleeding)    Fall at home, initial encounter 01/21/2019   GERD (gastroesophageal reflux disease)    Health care maintenance 03/06/2014   Intellectual disability    Left-sided Bell's palsy 12/22/2018   Liver transplant recipient [facility])    Lumbar back pain 04/06/2014   PONV (postoperative nausea and vomiting)    Primary biliary cirrhosis ([place]) 05/07/2011   Productive cough 07/13/2019   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 seizures   Vitamin D deficiency     PAST SURGICAL HISTORY: Past Surgical History:  Procedure Laterality Date  adenonoidectomy     APPENDECTOMY     CHOLECYSTECTOMY     COLONOSCOPY W/ BIOPSIES     ESOPHAGOGASTRODUODENOSCOPY (EGD) WITH PROPOFOL N/A 10/01/2012   Procedure: ESOPHAGOGASTRODUODENOSCOPY (EGD) WITH PROPOFOL;  Surgeon: Arta Silence, MD;  Location: [facility] ENDOSCOPY;  Service: Endoscopy;  Laterality: N/A;   eustacean tubes     EYE MUSCLE SURGERY     LIVER  TRANSPLANT      FAMILY HISTORY: Family History  Problem Relation Age of Onset   Diabetes Mother    Heart disease Mother    COPD Mother    Breast cancer Cousin    Allergic rhinitis Neg Hx    Angioedema Neg Hx    Asthma Neg Hx    Atopy Neg Hx    Eczema Neg Hx    Immunodeficiency Neg Hx    Urticaria Neg Hx     SOCIAL HISTORY: Social History   Socioeconomic History   Marital status: Single    Spouse name: Not on file   Number of children: 0   Years of education: 12 th   Highest education level: Not on file  Occupational History    Comment: Disabled  Tobacco Use   Smoking status: Never   Smokeless tobacco: Never  Vaping Use   Vaping Use: Never used  Substance and Sexual Activity   Alcohol use: No    Alcohol/week: 0.0 standard drinks   Drug use: No   Sexual activity: Never  Other Topics Concern   Not on file  Social History Narrative   Lives alone but uncle and his wife live next door and are her primary caregivers. Sonia Side (uncle) can be reached at [phone].    Education High school education.   Disabled.   Right handed.   Caffeine tea and coffee daily.         Current Social History   (Please include date ( . td) when updating information )      Who lives at home: lives  alone 07/05/2016    Transportation: uses Medicaid transportation to and from appointments 07/05/2016   Important Relationships & Pets: Lynnda Shields lives next door 07/05/2016    Current Stressors: Person Care aid will stop this month 07/05/2016   Work / Education:  Receives disability 07/05/2016   Religious / Personal Beliefs: has strong religious beliefs 07/05/2016   Interests / Fun: Enjoys going to bible study  07/05/2016   Other: enjoys reading her bible 07/05/2016                                                                                                         Social Determinants of Health   Financial Resource Strain: Low Risk    Difficulty of Paying Living Expenses: Not very hard  Food  Insecurity: No Food Insecurity   Worried About Charity fundraiser in the Last Year: Never true   Ran Out of Food in the Last Year: Never true  Transportation Needs: No Transportation Needs   Lack of Transportation (Medical): No   Lack of Transportation (Non-Medical): No  Physical Activity: Not on file  Stress: No Stress Concern Present   Feeling of Stress : Only a little  Social Connections: Not on file  Intimate Partner Violence: Not on file   PHYSICAL EXAM  Vitals:   06/06/21 0928  BP: 124/86  Pulse: (!) 101  Weight: 214 lb 8 oz (97.3 kg)  Height: _0  (1.499 m)    Body mass index is 43.32 kg/m.  Generalized: Well developed, in no acute distress  Neurological examination  Mentation: Alert oriented to time, place, history taking. Follows all commands, cognitive disability, speech is stuttering, slow Cranial nerve II-XII: Pupils were equal round reactive to light. At rest, right extropia. Facial sensation and strength were normal. Head turning and shoulder shrug were normal and symmetric. Motor: The motor testing reveals 5 over 5 strength of all 4 extremities. Good symmetric motor tone is noted throughout. Tremor translated to spiral draw, handwriting moderately  Sensory: Sensory testing is intact to soft touch on all 4 extremities. No evidence of extinction is noted.  Coordination: Cerebellar testing reveals good finger-nose-finger and heel-to-shin bilaterally. Mild tremor bilaterally with finger-nose-finger. Gait and station: Gait is slightly wide based, but stady Reflexes: Deep tendon reflexes are symmetric but decreased throughout  DIAGNOSTIC DATA (LABS, IMAGING, TESTING) - I reviewed patient records, labs, notes, testing and imaging myself where available.  Lab Results  Component Value Date   WBC 3.7 06/06/2020   HGB 10.4 (L) 06/06/2020   HCT 30.5 (L) 06/06/2020   MCV 89 06/06/2020   PLT 132 (L) 06/06/2020      Component Value Date/Time   NA 140 06/06/2020 0959    K 3.9 06/06/2020 0959   CL 105 06/06/2020 0959   CO2 21 06/06/2020 0959   GLUCOSE 107 (H) 06/06/2020 0959   GLUCOSE 100 (H) 12/14/2018 1413   BUN 29 (H) 06/06/2020 0959   CREATININE 1.36 (H) 06/06/2020 0959   CREATININE 0.83 06/28/2014 0932   CALCIUM 8.6 (L) 06/06/2020 0959   CALCIUM 9.0 09/11/2007 2301   PROT 7.1 06/06/2020 0959   ALBUMIN 3.9 06/06/2020 0959   AST 23 06/06/2020 0959   ALT 19 06/06/2020 0959   ALKPHOS 74 06/06/2020 0959   BILITOT 0.5 06/06/2020 0959   GFRNONAA 47 (L) 06/06/2020 0959   GFRNONAA >89 11/09/2011 1208   GFRAA 54 (L) 06/06/2020 0959   GFRAA >89 11/09/2011 1208   Lab Results  Component Value Date   CHOL 214 (H) 08/12/2019   HDL 49 08/12/2019   LDLCALC 129 (H) 08/12/2019   LDLDIRECT 64 07/10/2011   TRIG 201 (H) 08/12/2019   CHOLHDL 4.4 08/12/2019   Lab Results  Component Value Date   HGBA1C 5.1 04/28/2018   Lab Results  Component Value Date   VITAMINB12 616 08/13/2007   Lab Results  Component Value Date   TSH 2.810 04/27/2021   ASSESSMENT AND PLAN 46 y.o. year old female  has a past medical history of Abnormal vaginal bleeding, Allergy, Anemia, Arthritis, Asthma (09-23-12), Biliary cirrhosis ([place]) (04/26/11), Constipation, DUB (dysfunctional uterine bleeding), Fall at home, initial encounter (01/21/2019), GERD (gastroesophageal reflux disease), Health care maintenance (03/06/2014), Intellectual disability, Left-sided Bell's palsy (12/22/2018), Liver transplant recipient [facility]), Lumbar back pain (04/06/2014), PONV (postoperative nausea and vomiting), Primary biliary cirrhosis ([place]) (05/07/2011), Productive cough (07/13/2019), 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 here with:  1.  Seizure disorder 2.  Intellectual disability 3.  Status post liver transplant 2015, follows with [name]   -Doing well, no recent seizures since  transplant in 2015 -Does have tremor, reportedly since transplant, seeing psych, medications  adjusted, will get records from [facility] as patient is limited historian, Depakote can carry side effect of tremor as well, has lost 10 lbs in last year  -Continue Depakote ER 500 mg, 2 tablets daily -Check routine labs today, Depakote level, CMP, CBC reviewed in EPIC -Call for seizure activity, will return in 1 year, will see Dr. [name] to check up on tremor on Depakote (Keppra and Vimpat caused worsening behavorial issues)  Butler Denmark, AGNP-C, DNP 06/06/2021, 10:56 AM [facility] [address] [place], [place] [address] [phone]

## 2021-06-06 NOTE — Patient Instructions (Signed)
I will get records from psychiatry  Continue Depakote  Check labs today  Call for seizures  See you back in 1 year

## 2021-06-07 DIAGNOSIS — G40909 Epilepsy, unspecified, not intractable, without status epilepticus: Secondary | ICD-10-CM | POA: Diagnosis not present

## 2021-06-07 DIAGNOSIS — J45909 Unspecified asthma, uncomplicated: Secondary | ICD-10-CM | POA: Diagnosis not present

## 2021-06-07 DIAGNOSIS — Z9181 History of falling: Secondary | ICD-10-CM | POA: Diagnosis not present

## 2021-06-07 DIAGNOSIS — M797 Fibromyalgia: Secondary | ICD-10-CM | POA: Diagnosis not present

## 2021-06-07 DIAGNOSIS — K219 Gastro-esophageal reflux disease without esophagitis: Secondary | ICD-10-CM | POA: Diagnosis not present

## 2021-06-07 DIAGNOSIS — F200 Paranoid schizophrenia: Secondary | ICD-10-CM | POA: Diagnosis not present

## 2021-06-07 DIAGNOSIS — F259 Schizoaffective disorder, unspecified: Secondary | ICD-10-CM | POA: Diagnosis not present

## 2021-06-07 DIAGNOSIS — F819 Developmental disorder of scholastic skills, unspecified: Secondary | ICD-10-CM | POA: Diagnosis not present

## 2021-06-07 DIAGNOSIS — Z7951 Long term (current) use of inhaled steroids: Secondary | ICD-10-CM | POA: Diagnosis not present

## 2021-06-07 LAB — COMPREHENSIVE METABOLIC PANEL
ALT: 19 IU/L (ref 0–32)
AST: 25 IU/L (ref 0–40)
Albumin/Globulin Ratio: 1.1 — ABNORMAL LOW (ref 1.2–2.2)
Albumin: 3.5 g/dL — ABNORMAL LOW (ref 3.8–4.8)
Alkaline Phosphatase: 62 IU/L (ref 44–121)
BUN/Creatinine Ratio: 16 (ref 9–23)
BUN: 17 mg/dL (ref 6–24)
Bilirubin Total: 0.4 mg/dL (ref 0.0–1.2)
CO2: 23 mmol/L (ref 20–29)
Calcium: 9 mg/dL (ref 8.7–10.2)
Chloride: 105 mmol/L (ref 96–106)
Creatinine, Ser: 1.04 mg/dL — ABNORMAL HIGH (ref 0.57–1.00)
Globulin, Total: 3.2 g/dL (ref 1.5–4.5)
Glucose: 98 mg/dL (ref 70–99)
Potassium: 4.2 mmol/L (ref 3.5–5.2)
Sodium: 141 mmol/L (ref 134–144)
Total Protein: 6.7 g/dL (ref 6.0–8.5)
eGFR: 67 mL/min/{1.73_m2} (ref >59)

## 2021-06-07 LAB — VALPROIC ACID LEVEL: Valproic Acid Lvl: 74 ug/mL (ref 50–100)

## 2021-06-08 DIAGNOSIS — F200 Paranoid schizophrenia: Secondary | ICD-10-CM | POA: Diagnosis not present

## 2021-06-09 DIAGNOSIS — F200 Paranoid schizophrenia: Secondary | ICD-10-CM | POA: Diagnosis not present

## 2021-06-10 DIAGNOSIS — F200 Paranoid schizophrenia: Secondary | ICD-10-CM | POA: Diagnosis not present

## 2021-06-11 DIAGNOSIS — F200 Paranoid schizophrenia: Secondary | ICD-10-CM | POA: Diagnosis not present

## 2021-06-12 DIAGNOSIS — F200 Paranoid schizophrenia: Secondary | ICD-10-CM | POA: Diagnosis not present

## 2021-06-13 DIAGNOSIS — F200 Paranoid schizophrenia: Secondary | ICD-10-CM | POA: Diagnosis not present

## 2021-06-13 NOTE — Progress Notes (Signed)
Chart reviewed, agree above plan  Depakote level was 74, within therapeutic level.Depakote can cause worsening action tremor

## 2021-06-14 DIAGNOSIS — F200 Paranoid schizophrenia: Secondary | ICD-10-CM | POA: Diagnosis not present

## 2021-06-14 DIAGNOSIS — Z7951 Long term (current) use of inhaled steroids: Secondary | ICD-10-CM | POA: Diagnosis not present

## 2021-06-14 DIAGNOSIS — Z9181 History of falling: Secondary | ICD-10-CM | POA: Diagnosis not present

## 2021-06-14 DIAGNOSIS — K219 Gastro-esophageal reflux disease without esophagitis: Secondary | ICD-10-CM | POA: Diagnosis not present

## 2021-06-14 DIAGNOSIS — M797 Fibromyalgia: Secondary | ICD-10-CM | POA: Diagnosis not present

## 2021-06-14 DIAGNOSIS — F259 Schizoaffective disorder, unspecified: Secondary | ICD-10-CM | POA: Diagnosis not present

## 2021-06-14 DIAGNOSIS — G40909 Epilepsy, unspecified, not intractable, without status epilepticus: Secondary | ICD-10-CM | POA: Diagnosis not present

## 2021-06-14 DIAGNOSIS — F819 Developmental disorder of scholastic skills, unspecified: Secondary | ICD-10-CM | POA: Diagnosis not present

## 2021-06-14 DIAGNOSIS — J45909 Unspecified asthma, uncomplicated: Secondary | ICD-10-CM | POA: Diagnosis not present

## 2021-06-15 DIAGNOSIS — F200 Paranoid schizophrenia: Secondary | ICD-10-CM | POA: Diagnosis not present

## 2021-06-16 DIAGNOSIS — F200 Paranoid schizophrenia: Secondary | ICD-10-CM | POA: Diagnosis not present

## 2021-06-17 DIAGNOSIS — F200 Paranoid schizophrenia: Secondary | ICD-10-CM | POA: Diagnosis not present

## 2021-06-18 DIAGNOSIS — F200 Paranoid schizophrenia: Secondary | ICD-10-CM | POA: Diagnosis not present

## 2021-06-19 DIAGNOSIS — F200 Paranoid schizophrenia: Secondary | ICD-10-CM | POA: Diagnosis not present

## 2021-06-20 DIAGNOSIS — F200 Paranoid schizophrenia: Secondary | ICD-10-CM | POA: Diagnosis not present

## 2021-06-21 DIAGNOSIS — J45909 Unspecified asthma, uncomplicated: Secondary | ICD-10-CM | POA: Diagnosis not present

## 2021-06-21 DIAGNOSIS — F200 Paranoid schizophrenia: Secondary | ICD-10-CM | POA: Diagnosis not present

## 2021-06-21 DIAGNOSIS — F819 Developmental disorder of scholastic skills, unspecified: Secondary | ICD-10-CM | POA: Diagnosis not present

## 2021-06-21 DIAGNOSIS — Z7951 Long term (current) use of inhaled steroids: Secondary | ICD-10-CM | POA: Diagnosis not present

## 2021-06-21 DIAGNOSIS — M797 Fibromyalgia: Secondary | ICD-10-CM | POA: Diagnosis not present

## 2021-06-21 DIAGNOSIS — K219 Gastro-esophageal reflux disease without esophagitis: Secondary | ICD-10-CM | POA: Diagnosis not present

## 2021-06-21 DIAGNOSIS — G40909 Epilepsy, unspecified, not intractable, without status epilepticus: Secondary | ICD-10-CM | POA: Diagnosis not present

## 2021-06-21 DIAGNOSIS — F259 Schizoaffective disorder, unspecified: Secondary | ICD-10-CM | POA: Diagnosis not present

## 2021-06-21 DIAGNOSIS — Z9181 History of falling: Secondary | ICD-10-CM | POA: Diagnosis not present

## 2021-06-22 DIAGNOSIS — F200 Paranoid schizophrenia: Secondary | ICD-10-CM | POA: Diagnosis not present

## 2021-06-23 DIAGNOSIS — F200 Paranoid schizophrenia: Secondary | ICD-10-CM | POA: Diagnosis not present

## 2021-06-24 DIAGNOSIS — F200 Paranoid schizophrenia: Secondary | ICD-10-CM | POA: Diagnosis not present

## 2021-06-25 DIAGNOSIS — F200 Paranoid schizophrenia: Secondary | ICD-10-CM | POA: Diagnosis not present

## 2021-06-26 DIAGNOSIS — F200 Paranoid schizophrenia: Secondary | ICD-10-CM | POA: Diagnosis not present

## 2021-06-27 DIAGNOSIS — F200 Paranoid schizophrenia: Secondary | ICD-10-CM | POA: Diagnosis not present

## 2021-06-28 DIAGNOSIS — F200 Paranoid schizophrenia: Secondary | ICD-10-CM | POA: Diagnosis not present

## 2021-06-28 DIAGNOSIS — F259 Schizoaffective disorder, unspecified: Secondary | ICD-10-CM | POA: Diagnosis not present

## 2021-06-28 DIAGNOSIS — K219 Gastro-esophageal reflux disease without esophagitis: Secondary | ICD-10-CM | POA: Diagnosis not present

## 2021-06-28 DIAGNOSIS — G40909 Epilepsy, unspecified, not intractable, without status epilepticus: Secondary | ICD-10-CM | POA: Diagnosis not present

## 2021-06-28 DIAGNOSIS — J45909 Unspecified asthma, uncomplicated: Secondary | ICD-10-CM | POA: Diagnosis not present

## 2021-06-28 DIAGNOSIS — M797 Fibromyalgia: Secondary | ICD-10-CM | POA: Diagnosis not present

## 2021-06-28 DIAGNOSIS — Z7951 Long term (current) use of inhaled steroids: Secondary | ICD-10-CM | POA: Diagnosis not present

## 2021-06-28 DIAGNOSIS — F819 Developmental disorder of scholastic skills, unspecified: Secondary | ICD-10-CM | POA: Diagnosis not present

## 2021-06-28 DIAGNOSIS — Z9181 History of falling: Secondary | ICD-10-CM | POA: Diagnosis not present

## 2021-06-29 DIAGNOSIS — F200 Paranoid schizophrenia: Secondary | ICD-10-CM | POA: Diagnosis not present

## 2021-06-30 DIAGNOSIS — F200 Paranoid schizophrenia: Secondary | ICD-10-CM | POA: Diagnosis not present

## 2021-07-01 DIAGNOSIS — F200 Paranoid schizophrenia: Secondary | ICD-10-CM | POA: Diagnosis not present

## 2021-07-02 DIAGNOSIS — F200 Paranoid schizophrenia: Secondary | ICD-10-CM | POA: Diagnosis not present

## 2021-07-03 DIAGNOSIS — F200 Paranoid schizophrenia: Secondary | ICD-10-CM | POA: Diagnosis not present

## 2021-07-04 DIAGNOSIS — Z9181 History of falling: Secondary | ICD-10-CM | POA: Diagnosis not present

## 2021-07-04 DIAGNOSIS — Z7951 Long term (current) use of inhaled steroids: Secondary | ICD-10-CM | POA: Diagnosis not present

## 2021-07-04 DIAGNOSIS — K219 Gastro-esophageal reflux disease without esophagitis: Secondary | ICD-10-CM | POA: Diagnosis not present

## 2021-07-04 DIAGNOSIS — G40909 Epilepsy, unspecified, not intractable, without status epilepticus: Secondary | ICD-10-CM | POA: Diagnosis not present

## 2021-07-04 DIAGNOSIS — F819 Developmental disorder of scholastic skills, unspecified: Secondary | ICD-10-CM | POA: Diagnosis not present

## 2021-07-04 DIAGNOSIS — J45909 Unspecified asthma, uncomplicated: Secondary | ICD-10-CM | POA: Diagnosis not present

## 2021-07-04 DIAGNOSIS — F200 Paranoid schizophrenia: Secondary | ICD-10-CM | POA: Diagnosis not present

## 2021-07-04 DIAGNOSIS — M797 Fibromyalgia: Secondary | ICD-10-CM | POA: Diagnosis not present

## 2021-07-04 DIAGNOSIS — F259 Schizoaffective disorder, unspecified: Secondary | ICD-10-CM | POA: Diagnosis not present

## 2021-07-05 DIAGNOSIS — F259 Schizoaffective disorder, unspecified: Secondary | ICD-10-CM | POA: Diagnosis not present

## 2021-07-05 DIAGNOSIS — G40909 Epilepsy, unspecified, not intractable, without status epilepticus: Secondary | ICD-10-CM | POA: Diagnosis not present

## 2021-07-05 DIAGNOSIS — F200 Paranoid schizophrenia: Secondary | ICD-10-CM | POA: Diagnosis not present

## 2021-07-05 DIAGNOSIS — F819 Developmental disorder of scholastic skills, unspecified: Secondary | ICD-10-CM | POA: Diagnosis not present

## 2021-07-05 DIAGNOSIS — K219 Gastro-esophageal reflux disease without esophagitis: Secondary | ICD-10-CM | POA: Diagnosis not present

## 2021-07-05 DIAGNOSIS — J45909 Unspecified asthma, uncomplicated: Secondary | ICD-10-CM | POA: Diagnosis not present

## 2021-07-05 DIAGNOSIS — Z7951 Long term (current) use of inhaled steroids: Secondary | ICD-10-CM | POA: Diagnosis not present

## 2021-07-05 DIAGNOSIS — M797 Fibromyalgia: Secondary | ICD-10-CM | POA: Diagnosis not present

## 2021-07-05 DIAGNOSIS — Z9181 History of falling: Secondary | ICD-10-CM | POA: Diagnosis not present

## 2021-07-06 DIAGNOSIS — F200 Paranoid schizophrenia: Secondary | ICD-10-CM | POA: Diagnosis not present

## 2021-07-07 DIAGNOSIS — F200 Paranoid schizophrenia: Secondary | ICD-10-CM | POA: Diagnosis not present

## 2021-07-08 DIAGNOSIS — F200 Paranoid schizophrenia: Secondary | ICD-10-CM | POA: Diagnosis not present

## 2021-07-10 DIAGNOSIS — F200 Paranoid schizophrenia: Secondary | ICD-10-CM | POA: Diagnosis not present

## 2021-07-11 DIAGNOSIS — F200 Paranoid schizophrenia: Secondary | ICD-10-CM | POA: Diagnosis not present

## 2021-07-12 DIAGNOSIS — M797 Fibromyalgia: Secondary | ICD-10-CM | POA: Diagnosis not present

## 2021-07-12 DIAGNOSIS — F819 Developmental disorder of scholastic skills, unspecified: Secondary | ICD-10-CM | POA: Diagnosis not present

## 2021-07-12 DIAGNOSIS — Z9181 History of falling: Secondary | ICD-10-CM | POA: Diagnosis not present

## 2021-07-12 DIAGNOSIS — Z7951 Long term (current) use of inhaled steroids: Secondary | ICD-10-CM | POA: Diagnosis not present

## 2021-07-12 DIAGNOSIS — F200 Paranoid schizophrenia: Secondary | ICD-10-CM | POA: Diagnosis not present

## 2021-07-12 DIAGNOSIS — F259 Schizoaffective disorder, unspecified: Secondary | ICD-10-CM | POA: Diagnosis not present

## 2021-07-12 DIAGNOSIS — J45909 Unspecified asthma, uncomplicated: Secondary | ICD-10-CM | POA: Diagnosis not present

## 2021-07-12 DIAGNOSIS — K219 Gastro-esophageal reflux disease without esophagitis: Secondary | ICD-10-CM | POA: Diagnosis not present

## 2021-07-12 DIAGNOSIS — G40909 Epilepsy, unspecified, not intractable, without status epilepticus: Secondary | ICD-10-CM | POA: Diagnosis not present

## 2021-07-12 DIAGNOSIS — E079 Disorder of thyroid, unspecified: Secondary | ICD-10-CM | POA: Diagnosis not present

## 2021-07-12 DIAGNOSIS — Z944 Liver transplant status: Secondary | ICD-10-CM | POA: Diagnosis not present

## 2021-07-13 DIAGNOSIS — F200 Paranoid schizophrenia: Secondary | ICD-10-CM | POA: Diagnosis not present

## 2021-07-14 DIAGNOSIS — F200 Paranoid schizophrenia: Secondary | ICD-10-CM | POA: Diagnosis not present

## 2021-07-15 DIAGNOSIS — F200 Paranoid schizophrenia: Secondary | ICD-10-CM | POA: Diagnosis not present

## 2021-07-16 DIAGNOSIS — F200 Paranoid schizophrenia: Secondary | ICD-10-CM | POA: Diagnosis not present

## 2021-07-17 DIAGNOSIS — F200 Paranoid schizophrenia: Secondary | ICD-10-CM | POA: Diagnosis not present

## 2021-07-18 ENCOUNTER — Other Ambulatory Visit: Payer: Self-pay

## 2021-07-18 ENCOUNTER — Ambulatory Visit (INDEPENDENT_AMBULATORY_CARE_PROVIDER_SITE_OTHER): Payer: Medicaid Other | Admitting: Allergy and Immunology

## 2021-07-18 ENCOUNTER — Encounter: Payer: Self-pay | Admitting: Allergy and Immunology

## 2021-07-18 VITALS — BP 126/84 | HR 94 | Temp 97.7°F | Resp 18 | Ht 64.0 in | Wt 215.2 lb

## 2021-07-18 DIAGNOSIS — F200 Paranoid schizophrenia: Secondary | ICD-10-CM | POA: Diagnosis not present

## 2021-07-18 DIAGNOSIS — J3089 Other allergic rhinitis: Secondary | ICD-10-CM | POA: Diagnosis not present

## 2021-07-18 DIAGNOSIS — K219 Gastro-esophageal reflux disease without esophagitis: Secondary | ICD-10-CM

## 2021-07-18 DIAGNOSIS — J4540 Moderate persistent asthma, uncomplicated: Secondary | ICD-10-CM | POA: Diagnosis not present

## 2021-07-18 DIAGNOSIS — Z7689 Persons encountering health services in other specified circumstances: Secondary | ICD-10-CM | POA: Diagnosis not present

## 2021-07-18 DIAGNOSIS — J301 Allergic rhinitis due to pollen: Secondary | ICD-10-CM

## 2021-07-18 DIAGNOSIS — D849 Immunodeficiency, unspecified: Secondary | ICD-10-CM

## 2021-07-18 DIAGNOSIS — L989 Disorder of the skin and subcutaneous tissue, unspecified: Secondary | ICD-10-CM | POA: Diagnosis not present

## 2021-07-18 MED ORDER — PANTOPRAZOLE SODIUM 40 MG PO TBEC: 40.0000 mg | DELAYED_RELEASE_TABLET | Freq: Two times a day (BID) | ORAL | 5 refills | Status: DC

## 2021-07-18 MED ORDER — FLUTICASONE PROPIONATE 50 MCG/ACT NA SUSP: 1.0000 | Freq: Two times a day (BID) | NASAL | 5 refills | Status: DC

## 2021-07-18 MED ORDER — CETIRIZINE HCL 10 MG PO TABS: 10.0000 mg | ORAL_TABLET | Freq: Two times a day (BID) | ORAL | 1 refills | Status: DC | PRN

## 2021-07-18 MED ORDER — VENTOLIN HFA 108 (90 BASE) MCG/ACT IN AERS: 2.0000 | INHALATION_SPRAY | RESPIRATORY_TRACT | 1 refills | Status: DC | PRN

## 2021-07-18 MED ORDER — AZELASTINE HCL 0.1 % NA SOLN: 2.0000 | Freq: Two times a day (BID) | NASAL | 5 refills | Status: DC

## 2021-07-18 MED ORDER — MOMETASONE FUROATE 0.1 % EX CREA: TOPICAL_CREAM | Freq: Two times a day (BID) | CUTANEOUS | 1 refills | Status: DC | PRN

## 2021-07-18 MED ORDER — ALBUTEROL SULFATE (2.5 MG/3ML) 0.083% IN NEBU: 2.5000 mg | INHALATION_SOLUTION | RESPIRATORY_TRACT | 1 refills | Status: DC | PRN

## 2021-07-18 MED ORDER — BUDESONIDE-FORMOTEROL FUMARATE 160-4.5 MCG/ACT IN AERO: 2.0000 | INHALATION_SPRAY | Freq: Two times a day (BID) | RESPIRATORY_TRACT | 5 refills | Status: DC

## 2021-07-18 MED ORDER — SPIRIVA RESPIMAT 1.25 MCG/ACT IN AERS: 2.0000 | INHALATION_SPRAY | Freq: Every day | RESPIRATORY_TRACT | 5 refills | Status: DC

## 2021-07-18 MED ORDER — OMEPRAZOLE 40 MG PO CPDR: 40.0000 mg | DELAYED_RELEASE_CAPSULE | Freq: Two times a day (BID) | ORAL | 5 refills | Status: DC

## 2021-07-18 MED ORDER — MONTELUKAST SODIUM 10 MG PO TABS: 10.0000 mg | ORAL_TABLET | Freq: Every evening | ORAL | 1 refills | Status: DC

## 2021-07-18 NOTE — Patient Instructions (Addendum)
°  1.  Continue Symbicort 160 2 inhalations 2 times per day   2.  Continue Spiriva 1.25 Respimat - 2 inhalations 1 time per day  3.  Continue montelukast 10 mg one tablet once a day  4. Continue nasal Azelastine and fluticasone 1 spray each nostril twice a day  5. Continue cetirizine 10 mg one tablet once a day  6. Continue Pantoprazole or Omeprazole 40 mg 2 times per day  7. If needed:  A. Albuterol HFA 2 inhalations or albuterol nebulizer every 4-6 hours  B. Mometasone 0.1% cream 1-2 times a day if needed   8. "Action Plan" for increased asthma activity:   A. Start Flovent 110 - 2 inhalations 2 times per day  B. Continue Symbicort and Spiriva  C. Use albuterol if needed  9. Return to clinic in 6 months or earlier if problem

## 2021-07-18 NOTE — Progress Notes (Signed)
[facility] [place]   Follow-up Note  Referring Provider: Donnal Moat* Primary Provider: Eulis Foster, MD Date of Office Visit: 07/18/2021  Subjective:   [name] (DOB: May 07, 1976) is a 46 y.o. 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  Her last visit to this clinic was 16 August 2020.  She believes that her asthma has been a little bit more active since the beginning of this month.  This appears to occur in conjunction with some nasal congestion and some sneezing.  She had a fair amount of coughing at the beginning of the month but that has improved significantly.  She has noticed some wheezing.  She uses a bronchodilator usually late morning and this does appear to help her symptoms.  She continues to use Spiriva and continues to use Symbicort on a consistent basis.  Other than having a little bit of nasal congestion and sneezing in February of this year she has done very well regarding her upper airway as well using a nasal steroid and a leukotriene modifier.  Her history of LPR is being handled by the GI team who is treating her with a proton pump inhibitor twice a day.  Her use of a topical steroid to her hands is about 1 time per week and she is very satisfied with the response that she has received with this treatment.  She has received 4 COVID vaccines and has received this year's flu vaccine.  Allergies as of 07/18/2021       Reactions   Tramadol Hcl Itching, Nausea And Vomiting   Causes SEIZURES   Aspirin Nausea And Vomiting   Due to Stomach ulcer   Ibuprofen Other (See Comments)   Due to stomach ulcer   Penicillins Itching, Swelling,  Rash   Breakout  Has patient had a PCN reaction causing immediate rash, facial/tongue/throat swelling, SOB or lightheadedness with hypotension: Yes Has patient had a PCN reaction causing severe rash involving mucus membranes or skin necrosis: Yes Has patient had a PCN reaction that required hospitalization: Yes Has patient had a PCN reaction occurring within the last 10 years: No If all of the above answers are "NO", then may proceed with Cephalosporin use.     Pseudoephedrine Itching, Swelling   Tongue swelling   Sulfa Antibiotics Nausea And Vomiting   Acetaminophen    Other reaction(s): elevated LFT's   Latex Itching, Rash        Medication List    Accu-Chek Guide test strip Generic drug: glucose blood USE TO CHECK BLOOD SUGAR EVERY DAY   Accu-Chek Guide w/Device Kit 1 each by Does not apply route daily. Use to check blood sugar once daily or as directed.   Accu-Chek Softclix Lancets lancets Use to check blood sugar once daily in the morning prior to eating.   albuterol (2.5 MG/3ML) 0.083% nebulizer solution Commonly known as: PROVENTIL Take 3 mLs (2.5 mg total) by nebulization every 4 (four) hours as needed for wheezing or shortness of breath. 1 vial via nebulizer every 4-6 hours as needed.   ProAir HFA 108 (90 Base) MCG/ACT inhaler Generic drug: albuterol Inhale 1-2 puffs into the lungs every 4 (four) hours as needed for shortness of breath.   Ventolin HFA 108 (90  Base) MCG/ACT inhaler Generic drug: albuterol Inhale 2 puffs into the lungs every 4 (four) hours as needed for wheezing or shortness of breath.   ammonium lactate 12 % cream Commonly known as: AMLACTIN Apply topically 2 (two) times daily.   azelastine 0.1 % nasal spray Commonly known as: ASTELIN Place 2 sprays into both nostrils 2 (two) times daily.   benztropine 0.5 MG tablet Commonly known as: COGENTIN Take 0.5 mg by mouth at bedtime.   Blood Pressure Monitor Automat Devi Please use to check  blood pressure daily/ as needed.   budesonide 0.5 MG/2ML nebulizer solution Commonly known as: PULMICORT Take 2 mLs (0.5 mg total) by nebulization every 4 (four) hours as needed.   budesonide-formoterol 160-4.5 MCG/ACT inhaler Commonly known as: SYMBICORT Inhale 2 puffs into the lungs 2 (two) times daily. Use with spacer and mask.   carboxymethylcellulose 0.5 % Soln Commonly known as: REFRESH PLUS Place 1 drop into both eyes 4 (four) times daily as needed (for dry eyes).   cetirizine 10 MG tablet Commonly known as: ZYRTEC Take 1 tablet (10 mg total) by mouth 2 (two) times daily as needed for allergies (Can use an extra dose during flares).   cycloSPORINE modified 100 MG Caps Take 100-125 mg by mouth 2 (two) times daily. Take 100 mg in the AM and 125 mg in the PM   divalproex 500 MG 24 hr tablet Commonly known as: DEPAKOTE ER Take 2 tablets (1,000 mg total) by mouth at bedtime.   doxepin 25 MG capsule Commonly known as: SINEQUAN TK 1 C PO HS   famotidine 40 MG tablet Commonly known as: PEPCID TAKE 1 TABLET(40 MG) BY MOUTH TWICE DAILY   ferrous sulfate 325 (65 FE) MG EC tablet Take 1 tablet by mouth daily.   FIBER (GUAR GUM) PO Take by mouth.   fluticasone 50 MCG/ACT nasal spray Commonly known as: FLONASE SHAKE LIQUID AND USE 1 SPRAY IN EACH NOSTRIL TWICE DAILY   levothyroxine 50 MCG tablet Commonly known as: SYNTHROID TAKE 1 TABLET(50 MCG) BY MOUTH EVERY MORNING 30 MINUTES BEFORE FOOD   MAGnesium-Oxide 400 (241.3 Mg) MG tablet Generic drug: magnesium oxide Take 1 tablet by mouth 2 (two) times daily.   mometasone 0.1 % cream Commonly known as: ELOCON Apply topically 2 (two) times daily as needed. Can use on skin one to two times daily if needed.   montelukast 10 MG tablet Commonly known as: SINGULAIR TAKE 1 TABLET(10 MG) BY MOUTH AT BEDTIME   multivitamin tablet Take 1 tablet by mouth daily. 9 am.   mycophenolate 250 MG capsule Commonly known as:  CELLCEPT Take 1 capsule (250 mg total) by mouth 2 (two) times daily. One tablet daily   Nebulizer/Tubing/Mouthpiece Kit 2 each by Does not apply route as directed.   Nebulizers Misc 1 kit by Does not apply route as directed.   omeprazole 40 MG capsule Commonly known as: PRILOSEC Take 1 capsule (40 mg total) by mouth 2 (two) times daily.   pantoprazole 40 MG tablet Commonly known as: PROTONIX Take 40 mg by mouth 2 (two) times daily.   polyethylene glycol 17 g packet Commonly known as: MIRALAX / GLYCOLAX Take 17 g by mouth daily as needed (for constipation). In water or juice   prazosin 1 MG capsule Commonly known as: MINIPRESS Take 2 mg by mouth at bedtime.   RA Vitamin D-3 50 MCG (2000 UT) Caps Generic drug: Cholecalciferol Take 1 capsule by mouth daily.   risperiDONE 3 MG tablet  Commonly known as: RISPERDAL Take 3 mg by mouth at bedtime.   Spacer/Aero-Hold [employer] Use as directed with inhaler.   Spiriva Respimat 1.25 MCG/ACT Aers Generic drug: Tiotropium Bromide Monohydrate Inhale 2 puffs into the lungs daily.    Past Medical History:  Diagnosis Date   Abnormal vaginal bleeding    Allergy    Anemia    Arthritis    right knee   Asthma 09-23-12   Asthma somewhat causing increase wheezing and mild congestion at present   Biliary cirrhosis ([place]) 04/26/11   Constipation    DUB (dysfunctional uterine bleeding)    Fall at home, initial encounter 01/21/2019   GERD (gastroesophageal reflux disease)    Health care maintenance 03/06/2014   Intellectual disability    Left-sided Bell's palsy 12/22/2018   Liver transplant recipient [facility])    Lumbar back pain 04/06/2014   PONV (postoperative nausea and vomiting)    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 seizures   Vitamin D deficiency     Past Surgical History:  Procedure Laterality Date    adenonoidectomy     APPENDECTOMY     CHOLECYSTECTOMY     COLONOSCOPY W/ BIOPSIES     ESOPHAGOGASTRODUODENOSCOPY (EGD) WITH PROPOFOL N/A 10/01/2012   Procedure: ESOPHAGOGASTRODUODENOSCOPY (EGD) WITH PROPOFOL;  Surgeon: Arta Silence, MD;  Location: [facility];  Service: Endoscopy;  Laterality: N/A;   eustacean tubes     EYE MUSCLE SURGERY     LIVER TRANSPLANT      Review of systems negative except as noted in HPI / PMHx or noted below:  Review of Systems  Constitutional: Negative.   HENT: Negative.    Eyes: Negative.   Respiratory: Negative.    Cardiovascular: Negative.   Gastrointestinal: Negative.   Genitourinary: Negative.   Musculoskeletal: Negative.   Skin: Negative.   Neurological: Negative.   Endo/Heme/Allergies: Negative.   Psychiatric/Behavioral: Negative.      Objective:   Vitals:   07/18/21 0910  BP: 126/84  Pulse: 94  Resp: 18  Temp: 97.7 F (36.5 C)  SpO2: 99%   Height: '5\' 4"'  (162.6 cm)  Weight: 215 lb 3.2 oz (97.6 kg)   Physical Exam Constitutional:      Appearance: She is not diaphoretic.  HENT:     Head: Normocephalic.     Right Ear: Tympanic membrane, ear canal and external ear normal.     Left Ear: Tympanic membrane, ear canal and external ear normal.     Nose: Nose normal. No mucosal edema or rhinorrhea.     Mouth/Throat:     Pharynx: Uvula midline. No oropharyngeal exudate.  Eyes:     Conjunctiva/sclera: Conjunctivae normal.  Neck:     Thyroid: No thyromegaly.     Trachea: Trachea normal. No tracheal tenderness or tracheal deviation.  Cardiovascular:     Rate and Rhythm: Normal rate and regular rhythm.     Heart sounds: Normal heart sounds, S1 normal and S2 normal. No murmur heard. Pulmonary:     Effort: No respiratory distress.     Breath sounds: Normal breath sounds. No stridor. No wheezing or rales.  Lymphadenopathy:     Head:     Right side of head: No tonsillar adenopathy.     Left side of head: No tonsillar adenopathy.      Cervical: No cervical adenopathy.  Skin:    Findings: No erythema or  rash.     Nails: There is no clubbing.  Neurological:     Mental Status: She is alert.    Diagnostics:    Spirometry was performed and demonstrated an FEV1 of 1.54 at 64 % of predicted.  Assessment and Plan:   1. Asthma, moderate persistent, well-controlled   2. Perennial allergic rhinitis   3. Seasonal allergic rhinitis due to pollen   4. LPRD (laryngopharyngeal reflux disease)   5. Inflammatory dermatosis   6. Immunosuppression ([address])     1.  Continue Symbicort 160 2 inhalations 2 times per day   2.  Continue Spiriva 1.25 Respimat - 2 inhalations 1 time per day  3.  Continue montelukast 10 mg one tablet once a day  4. Continue nasal Azelastine and fluticasone 1 spray each nostril twice a day  5. Continue cetirizine 10 mg one tablet once a day  6. Continue Pantoprazole or Omeprazole 40 mg 2 times per day  7. If needed:  A. Albuterol HFA 2 inhalations or albuterol nebulizer every 4-6 hours  B. Mometasone 0.1% cream 1-2 times a day if needed   8. "Action Plan" for increased asthma activity:   A. Start Flovent 110 - 2 inhalations 2 times per day  B. Continue Symbicort and Spiriva  C. Use albuterol if needed  9. Return to clinic in 6 months or earlier if problem  Charnetta has a slight flare of her asthma and this appeared to occur during the earlier part of February in conjunction with some rhinitis.  I am hoping that this was just a viral respiratory tract infection and she will do well with the plan noted above which includes introducing a little bit more inhaled steroid for a few weeks.  Hopefully this is not a reflection of springtime pollen exposure.  Her other issues appear to be going quite well including her reflux induced respiratory disease.  Assuming she does well with the plan noted above I will see her back in this clinic in 6 months or earlier if there is a problem.  Allena Katz, MD Allergy /  Immunology [place]

## 2021-07-19 ENCOUNTER — Encounter: Payer: Self-pay | Admitting: Allergy and Immunology

## 2021-07-19 DIAGNOSIS — K219 Gastro-esophageal reflux disease without esophagitis: Secondary | ICD-10-CM | POA: Diagnosis not present

## 2021-07-19 DIAGNOSIS — F200 Paranoid schizophrenia: Secondary | ICD-10-CM | POA: Diagnosis not present

## 2021-07-19 DIAGNOSIS — Z7951 Long term (current) use of inhaled steroids: Secondary | ICD-10-CM | POA: Diagnosis not present

## 2021-07-19 DIAGNOSIS — Z9181 History of falling: Secondary | ICD-10-CM | POA: Diagnosis not present

## 2021-07-19 DIAGNOSIS — F259 Schizoaffective disorder, unspecified: Secondary | ICD-10-CM | POA: Diagnosis not present

## 2021-07-19 DIAGNOSIS — G40909 Epilepsy, unspecified, not intractable, without status epilepticus: Secondary | ICD-10-CM | POA: Diagnosis not present

## 2021-07-19 DIAGNOSIS — M797 Fibromyalgia: Secondary | ICD-10-CM | POA: Diagnosis not present

## 2021-07-19 DIAGNOSIS — E079 Disorder of thyroid, unspecified: Secondary | ICD-10-CM | POA: Diagnosis not present

## 2021-07-19 DIAGNOSIS — F819 Developmental disorder of scholastic skills, unspecified: Secondary | ICD-10-CM | POA: Diagnosis not present

## 2021-07-19 DIAGNOSIS — J45909 Unspecified asthma, uncomplicated: Secondary | ICD-10-CM | POA: Diagnosis not present

## 2021-07-19 DIAGNOSIS — Z944 Liver transplant status: Secondary | ICD-10-CM | POA: Diagnosis not present

## 2021-07-20 ENCOUNTER — Ambulatory Visit: Payer: Medicaid Other | Admitting: Family Medicine

## 2021-07-20 ENCOUNTER — Other Ambulatory Visit: Payer: Self-pay

## 2021-07-20 VITALS — Ht 59.0 in | Wt 216.0 lb

## 2021-07-20 DIAGNOSIS — Z944 Liver transplant status: Secondary | ICD-10-CM | POA: Diagnosis not present

## 2021-07-20 DIAGNOSIS — Z6841 Body Mass Index (BMI) 40.0 and over, adult: Secondary | ICD-10-CM | POA: Diagnosis not present

## 2021-07-20 DIAGNOSIS — Z7689 Persons encountering health services in other specified circumstances: Secondary | ICD-10-CM | POA: Diagnosis not present

## 2021-07-20 DIAGNOSIS — F200 Paranoid schizophrenia: Secondary | ICD-10-CM | POA: Diagnosis not present

## 2021-07-20 NOTE — Patient Instructions (Signed)
It was wonderful to see you today.  Today we talked about your goals:  Include vegetables for both lunch and dinner.  This includes NON-breaded, non-fried vegetables, which can be either fresh or frozen.   For breakfast, make sure you have some protein food and fruit.  For example,  [place] yogurt and fruit OR  SMALL amount of cereal ( to  cup Cheerios) with fruit and yogurt OR 2 boiled eggs, 1-2 slices of toast, and fruit Limit breaded foods to no more than twice a week.  Try to eat more whole fruits and vegetables and non-fried/breaded proteins daily.  - How will you document progress on goals?  On goals sheet, provided today.   We provided you with a goal sheet to track your progress!    Thank you for choosing [facility].   Please call [phone] with any questions about today's appointment.  Please be sure to schedule follow up at the front  desk before you leave today.   Sharion Settler, DO PGY-2 Family Medicine

## 2021-07-20 NOTE — Progress Notes (Signed)
Patient presents alone today, in good spirits. Brings along her goals sheet from last time- has made a great effort in drinking more water. Also brings her air-fried recipe book. She has been incorporating smoothies in the morning that include protein shakes and vegetables such as spinach. She has had several breaded foods lately, currently has 4 different breaded freezer foods in her home. Her exercise has been limited lately as she reports her asthma has been acting up- she recently saw her specialist for this and had some medications adjusted.   Relevant history/background: History of PBC s/p liver transplant. Hypothyroidism. Developmental delay.  Assessment:  Usual physical activity: Likes to walk when the weather is nice out. Dances at home about twice a week, uses [employer] to watch dancing videos. 24-hr recall:  (Up at  7 AM) B (9 AM)-  1 cup of grape juice with medications. 15 "chicken fries" made by Elonda Husky. Had a "handful" of spinach with a "handful" of cut up strawberries Snk ( AM)-  -- L (1 PM)-  1 salmon filet (~3-4 oz), corn on the cob with some olive oil. 12 oz of Coke Zero  Snk (7 PM)-       ---- D ( PM)-  6 jalepeo poppers (breaded with cream cheese on the inside). 12 oz Coke Zero. ~24 oz water. Snk ( PM)-  ------ Typical day? Yes.     Intervention: Completed diet and exercise history, and established: - SMART behavioral goals (Specific, Measurable, Action-oriented, Realistic, Time-based.)  Include vegetables for both lunch and dinner.  This includes NON-breaded, non-fried vegetables, which can be either fresh or frozen.   For breakfast, make sure you have some protein food and fruit.  For example,  [place] yogurt and fruit OR  SMALL amount of cereal ( to  cup Cheerios) with fruit and yogurt OR 2 boiled eggs, 1-2 slices of toast, and fruit Limit breaded foods to twice a week.  Try to eat more whole fruits and vegetables and non-fried/breaded proteins daily.  - How will  you document progress on goals?  On goals sheet, provided today.   For recommendations and goals, see Patient Instructions.    Follow-up: 4 weeks in Nutrition Clinic.    Sharion Settler

## 2021-07-21 DIAGNOSIS — F200 Paranoid schizophrenia: Secondary | ICD-10-CM | POA: Diagnosis not present

## 2021-07-23 DIAGNOSIS — F200 Paranoid schizophrenia: Secondary | ICD-10-CM | POA: Diagnosis not present

## 2021-07-26 DIAGNOSIS — E079 Disorder of thyroid, unspecified: Secondary | ICD-10-CM | POA: Diagnosis not present

## 2021-07-26 DIAGNOSIS — Z944 Liver transplant status: Secondary | ICD-10-CM | POA: Diagnosis not present

## 2021-07-26 DIAGNOSIS — M797 Fibromyalgia: Secondary | ICD-10-CM | POA: Diagnosis not present

## 2021-07-26 DIAGNOSIS — J45909 Unspecified asthma, uncomplicated: Secondary | ICD-10-CM | POA: Diagnosis not present

## 2021-07-26 DIAGNOSIS — Z7951 Long term (current) use of inhaled steroids: Secondary | ICD-10-CM | POA: Diagnosis not present

## 2021-07-26 DIAGNOSIS — F819 Developmental disorder of scholastic skills, unspecified: Secondary | ICD-10-CM | POA: Diagnosis not present

## 2021-07-26 DIAGNOSIS — K219 Gastro-esophageal reflux disease without esophagitis: Secondary | ICD-10-CM | POA: Diagnosis not present

## 2021-07-26 DIAGNOSIS — Z9181 History of falling: Secondary | ICD-10-CM | POA: Diagnosis not present

## 2021-07-26 DIAGNOSIS — F259 Schizoaffective disorder, unspecified: Secondary | ICD-10-CM | POA: Diagnosis not present

## 2021-07-26 DIAGNOSIS — G40909 Epilepsy, unspecified, not intractable, without status epilepticus: Secondary | ICD-10-CM | POA: Diagnosis not present

## 2021-07-30 DIAGNOSIS — F200 Paranoid schizophrenia: Secondary | ICD-10-CM | POA: Diagnosis not present

## 2021-08-02 DIAGNOSIS — J45909 Unspecified asthma, uncomplicated: Secondary | ICD-10-CM | POA: Diagnosis not present

## 2021-08-02 DIAGNOSIS — Z7951 Long term (current) use of inhaled steroids: Secondary | ICD-10-CM | POA: Diagnosis not present

## 2021-08-02 DIAGNOSIS — K219 Gastro-esophageal reflux disease without esophagitis: Secondary | ICD-10-CM | POA: Diagnosis not present

## 2021-08-02 DIAGNOSIS — E079 Disorder of thyroid, unspecified: Secondary | ICD-10-CM | POA: Diagnosis not present

## 2021-08-02 DIAGNOSIS — Z944 Liver transplant status: Secondary | ICD-10-CM | POA: Diagnosis not present

## 2021-08-02 DIAGNOSIS — Z9181 History of falling: Secondary | ICD-10-CM | POA: Diagnosis not present

## 2021-08-02 DIAGNOSIS — G40909 Epilepsy, unspecified, not intractable, without status epilepticus: Secondary | ICD-10-CM | POA: Diagnosis not present

## 2021-08-02 DIAGNOSIS — M797 Fibromyalgia: Secondary | ICD-10-CM | POA: Diagnosis not present

## 2021-08-02 DIAGNOSIS — F819 Developmental disorder of scholastic skills, unspecified: Secondary | ICD-10-CM | POA: Diagnosis not present

## 2021-08-02 DIAGNOSIS — F259 Schizoaffective disorder, unspecified: Secondary | ICD-10-CM | POA: Diagnosis not present

## 2021-08-07 ENCOUNTER — Ambulatory Visit: Payer: Medicaid Other | Admitting: Podiatry

## 2021-08-09 DIAGNOSIS — F819 Developmental disorder of scholastic skills, unspecified: Secondary | ICD-10-CM | POA: Diagnosis not present

## 2021-08-09 DIAGNOSIS — Z9181 History of falling: Secondary | ICD-10-CM | POA: Diagnosis not present

## 2021-08-09 DIAGNOSIS — J45909 Unspecified asthma, uncomplicated: Secondary | ICD-10-CM | POA: Diagnosis not present

## 2021-08-09 DIAGNOSIS — E079 Disorder of thyroid, unspecified: Secondary | ICD-10-CM | POA: Diagnosis not present

## 2021-08-09 DIAGNOSIS — K219 Gastro-esophageal reflux disease without esophagitis: Secondary | ICD-10-CM | POA: Diagnosis not present

## 2021-08-09 DIAGNOSIS — Z944 Liver transplant status: Secondary | ICD-10-CM | POA: Diagnosis not present

## 2021-08-09 DIAGNOSIS — M797 Fibromyalgia: Secondary | ICD-10-CM | POA: Diagnosis not present

## 2021-08-09 DIAGNOSIS — F259 Schizoaffective disorder, unspecified: Secondary | ICD-10-CM | POA: Diagnosis not present

## 2021-08-09 DIAGNOSIS — Z7951 Long term (current) use of inhaled steroids: Secondary | ICD-10-CM | POA: Diagnosis not present

## 2021-08-09 DIAGNOSIS — G40909 Epilepsy, unspecified, not intractable, without status epilepticus: Secondary | ICD-10-CM | POA: Diagnosis not present

## 2021-08-16 DIAGNOSIS — K219 Gastro-esophageal reflux disease without esophagitis: Secondary | ICD-10-CM | POA: Diagnosis not present

## 2021-08-16 DIAGNOSIS — Z944 Liver transplant status: Secondary | ICD-10-CM | POA: Diagnosis not present

## 2021-08-16 DIAGNOSIS — Z9181 History of falling: Secondary | ICD-10-CM | POA: Diagnosis not present

## 2021-08-16 DIAGNOSIS — Z7951 Long term (current) use of inhaled steroids: Secondary | ICD-10-CM | POA: Diagnosis not present

## 2021-08-16 DIAGNOSIS — E079 Disorder of thyroid, unspecified: Secondary | ICD-10-CM | POA: Diagnosis not present

## 2021-08-16 DIAGNOSIS — J45909 Unspecified asthma, uncomplicated: Secondary | ICD-10-CM | POA: Diagnosis not present

## 2021-08-16 DIAGNOSIS — F819 Developmental disorder of scholastic skills, unspecified: Secondary | ICD-10-CM | POA: Diagnosis not present

## 2021-08-16 DIAGNOSIS — F259 Schizoaffective disorder, unspecified: Secondary | ICD-10-CM | POA: Diagnosis not present

## 2021-08-16 DIAGNOSIS — M797 Fibromyalgia: Secondary | ICD-10-CM | POA: Diagnosis not present

## 2021-08-16 DIAGNOSIS — G40909 Epilepsy, unspecified, not intractable, without status epilepticus: Secondary | ICD-10-CM | POA: Diagnosis not present

## 2021-08-17 ENCOUNTER — Ambulatory Visit: Payer: Medicaid Other | Admitting: Family Medicine

## 2021-08-21 DIAGNOSIS — J45909 Unspecified asthma, uncomplicated: Secondary | ICD-10-CM | POA: Diagnosis not present

## 2021-08-22 DIAGNOSIS — J45909 Unspecified asthma, uncomplicated: Secondary | ICD-10-CM | POA: Diagnosis not present

## 2021-08-23 ENCOUNTER — Ambulatory Visit: Payer: Medicaid Other | Admitting: Podiatry

## 2021-08-23 DIAGNOSIS — J45909 Unspecified asthma, uncomplicated: Secondary | ICD-10-CM | POA: Diagnosis not present

## 2021-08-23 DIAGNOSIS — G40909 Epilepsy, unspecified, not intractable, without status epilepticus: Secondary | ICD-10-CM | POA: Diagnosis not present

## 2021-08-23 DIAGNOSIS — F819 Developmental disorder of scholastic skills, unspecified: Secondary | ICD-10-CM | POA: Diagnosis not present

## 2021-08-23 DIAGNOSIS — F259 Schizoaffective disorder, unspecified: Secondary | ICD-10-CM | POA: Diagnosis not present

## 2021-08-23 DIAGNOSIS — E079 Disorder of thyroid, unspecified: Secondary | ICD-10-CM | POA: Diagnosis not present

## 2021-08-23 DIAGNOSIS — Z944 Liver transplant status: Secondary | ICD-10-CM | POA: Diagnosis not present

## 2021-08-23 DIAGNOSIS — K219 Gastro-esophageal reflux disease without esophagitis: Secondary | ICD-10-CM | POA: Diagnosis not present

## 2021-08-23 DIAGNOSIS — Z7951 Long term (current) use of inhaled steroids: Secondary | ICD-10-CM | POA: Diagnosis not present

## 2021-08-23 DIAGNOSIS — M797 Fibromyalgia: Secondary | ICD-10-CM | POA: Diagnosis not present

## 2021-08-23 DIAGNOSIS — Z9181 History of falling: Secondary | ICD-10-CM | POA: Diagnosis not present

## 2021-08-24 DIAGNOSIS — J45909 Unspecified asthma, uncomplicated: Secondary | ICD-10-CM | POA: Diagnosis not present

## 2021-08-25 DIAGNOSIS — J45909 Unspecified asthma, uncomplicated: Secondary | ICD-10-CM | POA: Diagnosis not present

## 2021-08-27 NOTE — Progress Notes (Signed)
? ? ?  SUBJECTIVE:  ? ?Chief compliant/HPI: annual examination ? ?[name] is a 46 y.o. who presents today for an annual exam.  ? ?History tabs reviewed and updated.  ? ?Review of systems form reviewed and notable for burning in her hands.  ? ?Hand burning ?Patient reports that she is having a burning sensation in both of her hands.  She reports that this has started happening 2 weeks ago.  Patient denies any weakness.  She denies any injuries to her hands. ? ?OBJECTIVE:  ? ?BP 123/80   Pulse 96   Ht '4\' 11"'$  (1.499 m)   Wt 221 lb (100.2 kg)   SpO2 100%   BMI 44.64 kg/m?   ?Physical Exam ?Constitutional:   ?   Appearance: She is normal weight.  ?HENT:  ?   Right Ear: External ear normal.  ?   Left Ear: External ear normal.  ?   Nose: Nose normal.  ?   Mouth/Throat:  ?   Pharynx: Oropharynx is clear.  ?Cardiovascular:  ?   Rate and Rhythm: Normal rate and regular rhythm.  ?Pulmonary:  ?   Effort: Pulmonary effort is normal.  ?Abdominal:  ?   General: Bowel sounds are normal.  ?   Palpations: Abdomen is soft.  ?Musculoskeletal:     ?   General: No swelling or tenderness.  ?   Cervical back: Normal range of motion.  ?Skin: ?   General: Skin is warm.  ?   Capillary Refill: Capillary refill takes less than 2 seconds.  ?Neurological:  ?   General: No focal deficit present.  ?   Mental Status: She is alert and oriented to person, place, and time.  ? ?Hand: ?Inspection: No obvious deformity. No swelling, erythema or bruising ?Palpation: no TTP ?ROM: Full ROM of the digits and wrist ?Strength: 5/5 strength in the forearm, wrist and interosseus muscles ?Neurovascular: NV intact ?Special tests:  negative tinel's at the carpal tunnel, negative reverse Phalen's ? ? ?ASSESSMENT/PLAN:  ? ?BMI 40.0-44.9, adult ([place]) ?Lipid panel  ?Hemoglobin a1c completed today  ? ?Screening for colon cancer ?Submitted referral for colonoscopy  ?Patient recommended for repeat due in 2021 ?Last colonoscopy in 2019 with polyps removed ?   ? ?Annual Examination  ?See AVS for age appropriate recommendations.   ?PHQ score 6, reviewed and discussed.  ?Blood pressure reviewed and at goal .  ?Asked about intimate partner violence and resources given as appropriate  ?The patient currently uses no method for contraception. Folate recommended as appropriate, minimum of 400 mcg per day.  ? ?Considered the following items based upon USPSTF recommendations: ?Diabetes screening: ordered ?Screening for elevated cholesterol: ordered ?HIV testing:  not sexually active, neg as of 2015 ?Hepatitis C:  neg 2008 ?Hepatitis B:  neg 2008 ? ?GC/CT not at high risk and not ordered. ? ? ?Cervical cancer screening: due for Pap today, cytology + HPV ordered ?Breast cancer screening: discussed potential benefits, risks including overdiagnosis and biopsy, elected proceed with mammogram ?Colorectal cancer screening:  ordered  if age 45 or over.  ? ?Follow up in 1 year or sooner if indicated.  ? ? ?Eulis Foster, MD ?[place]  ? ?

## 2021-08-28 ENCOUNTER — Ambulatory Visit (INDEPENDENT_AMBULATORY_CARE_PROVIDER_SITE_OTHER): Payer: Medicaid Other | Admitting: Family Medicine

## 2021-08-28 ENCOUNTER — Encounter: Payer: Self-pay | Admitting: Family Medicine

## 2021-08-28 VITALS — BP 123/80 | HR 96 | Ht 59.0 in | Wt 221.0 lb

## 2021-08-28 DIAGNOSIS — Z1211 Encounter for screening for malignant neoplasm of colon: Secondary | ICD-10-CM

## 2021-08-28 DIAGNOSIS — Z6841 Body Mass Index (BMI) 40.0 and over, adult: Secondary | ICD-10-CM

## 2021-08-28 MED ORDER — GABAPENTIN 100 MG PO CAPS: 100.0000 mg | ORAL_CAPSULE | Freq: Every day | ORAL | 0 refills | Status: DC

## 2021-08-28 NOTE — Patient Instructions (Addendum)
Please schedule a Pap smear in the next 4-6 weeks. ? ?Today we will check blood work to check your cholesterol and for signs of diabetes. ? ?I prescribed a medication to help with the burning sensation in your hands.  Please take 100 mg at bedtime.  This medication can make you sleepy.  We can discuss follow-up for the sensation in your hands in the next 4-6 weeks. ?

## 2021-08-29 ENCOUNTER — Telehealth: Payer: Self-pay | Admitting: Family Medicine

## 2021-08-29 DIAGNOSIS — J45909 Unspecified asthma, uncomplicated: Secondary | ICD-10-CM | POA: Diagnosis not present

## 2021-08-29 LAB — LIPID PANEL
Chol/HDL Ratio: 4 ratio (ref 0.0–4.4)
Cholesterol, Total: 207 mg/dL — ABNORMAL HIGH (ref 100–199)
HDL: 52 mg/dL (ref >39)
LDL Chol Calc (NIH): 120 mg/dL — ABNORMAL HIGH (ref 0–99)
Triglycerides: 200 mg/dL — ABNORMAL HIGH (ref 0–149)
VLDL Cholesterol Cal: 35 mg/dL (ref 5–40)

## 2021-08-29 LAB — HEMOGLOBIN A1C
Est. average glucose Bld gHb Est-mCnc: 114 mg/dL
Hgb A1c MFr Bld: 5.6 % (ref 4.8–5.6)

## 2021-08-29 NOTE — Telephone Encounter (Signed)
Patient is calling to check on the status of her FL2 form being completed. She would like for someone to call her when it is completed and ready to be faxed back.  ? ?The best call back number is [phone].  ?

## 2021-08-30 DIAGNOSIS — K219 Gastro-esophageal reflux disease without esophagitis: Secondary | ICD-10-CM | POA: Diagnosis not present

## 2021-08-30 DIAGNOSIS — Z944 Liver transplant status: Secondary | ICD-10-CM | POA: Diagnosis not present

## 2021-08-30 DIAGNOSIS — F819 Developmental disorder of scholastic skills, unspecified: Secondary | ICD-10-CM | POA: Diagnosis not present

## 2021-08-30 DIAGNOSIS — M797 Fibromyalgia: Secondary | ICD-10-CM | POA: Diagnosis not present

## 2021-08-30 DIAGNOSIS — Z9181 History of falling: Secondary | ICD-10-CM | POA: Diagnosis not present

## 2021-08-30 DIAGNOSIS — Z7951 Long term (current) use of inhaled steroids: Secondary | ICD-10-CM | POA: Diagnosis not present

## 2021-08-30 DIAGNOSIS — J45909 Unspecified asthma, uncomplicated: Secondary | ICD-10-CM | POA: Diagnosis not present

## 2021-08-30 DIAGNOSIS — G40909 Epilepsy, unspecified, not intractable, without status epilepticus: Secondary | ICD-10-CM | POA: Diagnosis not present

## 2021-08-30 DIAGNOSIS — Z1211 Encounter for screening for malignant neoplasm of colon: Secondary | ICD-10-CM | POA: Insufficient documentation

## 2021-08-30 DIAGNOSIS — F259 Schizoaffective disorder, unspecified: Secondary | ICD-10-CM | POA: Diagnosis not present

## 2021-08-30 DIAGNOSIS — Z6841 Body Mass Index (BMI) 40.0 and over, adult: Secondary | ICD-10-CM | POA: Insufficient documentation

## 2021-08-30 DIAGNOSIS — E079 Disorder of thyroid, unspecified: Secondary | ICD-10-CM | POA: Diagnosis not present

## 2021-08-30 NOTE — Assessment & Plan Note (Signed)
Submitted referral for colonoscopy  ?Patient recommended for repeat due in 2021 ?Last colonoscopy in 2019 with polyps removed ?

## 2021-08-30 NOTE — Assessment & Plan Note (Signed)
Lipid panel  ?Hemoglobin a1c completed today  ?

## 2021-08-30 NOTE — Telephone Encounter (Signed)
Patient is calling and said they received her FL2 forms but section 4 was not completed. They will fax form back over to be completed and refaxed back.  ? ? ?

## 2021-08-31 DIAGNOSIS — J45909 Unspecified asthma, uncomplicated: Secondary | ICD-10-CM | POA: Diagnosis not present

## 2021-09-01 DIAGNOSIS — J45909 Unspecified asthma, uncomplicated: Secondary | ICD-10-CM | POA: Diagnosis not present

## 2021-09-04 DIAGNOSIS — E079 Disorder of thyroid, unspecified: Secondary | ICD-10-CM | POA: Diagnosis not present

## 2021-09-04 DIAGNOSIS — Z9181 History of falling: Secondary | ICD-10-CM | POA: Diagnosis not present

## 2021-09-04 DIAGNOSIS — Z944 Liver transplant status: Secondary | ICD-10-CM | POA: Diagnosis not present

## 2021-09-04 DIAGNOSIS — G40909 Epilepsy, unspecified, not intractable, without status epilepticus: Secondary | ICD-10-CM | POA: Diagnosis not present

## 2021-09-04 DIAGNOSIS — M797 Fibromyalgia: Secondary | ICD-10-CM | POA: Diagnosis not present

## 2021-09-04 DIAGNOSIS — F259 Schizoaffective disorder, unspecified: Secondary | ICD-10-CM | POA: Diagnosis not present

## 2021-09-04 DIAGNOSIS — Z7951 Long term (current) use of inhaled steroids: Secondary | ICD-10-CM | POA: Diagnosis not present

## 2021-09-04 DIAGNOSIS — K219 Gastro-esophageal reflux disease without esophagitis: Secondary | ICD-10-CM | POA: Diagnosis not present

## 2021-09-04 DIAGNOSIS — F819 Developmental disorder of scholastic skills, unspecified: Secondary | ICD-10-CM | POA: Diagnosis not present

## 2021-09-04 DIAGNOSIS — J45909 Unspecified asthma, uncomplicated: Secondary | ICD-10-CM | POA: Diagnosis not present

## 2021-09-05 ENCOUNTER — Ambulatory Visit: Payer: Medicaid Other | Admitting: Podiatry

## 2021-09-05 ENCOUNTER — Encounter: Payer: Self-pay | Admitting: Podiatry

## 2021-09-05 DIAGNOSIS — B351 Tinea unguium: Secondary | ICD-10-CM | POA: Diagnosis not present

## 2021-09-05 DIAGNOSIS — M79674 Pain in right toe(s): Secondary | ICD-10-CM | POA: Diagnosis not present

## 2021-09-05 DIAGNOSIS — Z7689 Persons encountering health services in other specified circumstances: Secondary | ICD-10-CM | POA: Diagnosis not present

## 2021-09-05 DIAGNOSIS — M79675 Pain in left toe(s): Secondary | ICD-10-CM

## 2021-09-05 DIAGNOSIS — J45909 Unspecified asthma, uncomplicated: Secondary | ICD-10-CM | POA: Diagnosis not present

## 2021-09-05 NOTE — Progress Notes (Signed)
This patient returns to the office for evaluation and treatment of long thick painful nails .  This patient is unable to trim her own nails since the patient cannot reach her feet.  Patient says the nails are painful walking and wearing her shoes.  He returns for preventive foot care services. ? ?General Appearance  Alert, conversant and in no acute stress. ? ?Vascular  Dorsalis pedis and posterior tibial  pulses are palpable  bilaterally.  Capillary return is within normal limits  bilaterally. Temperature is within normal limits  bilaterally. ? ?Neurologic  Senn-Weinstein monofilament wire test within normal limits  bilaterally. Muscle power within normal limits bilaterally. ? ?Nails Thick disfigured discolored nails with subungual debris  from hallux to fifth toes bilaterally. No evidence of bacterial infection or drainage bilaterally. ? ?Orthopedic  No limitations of motion  feet .  No crepitus or effusions noted.  No bony pathology or digital deformities noted. ? ?Skin  normotropic skin with no porokeratosis noted bilaterally.  No signs of infections or ulcers noted.    ? ?Onychomycosis  Pain in toes right foot  Pain in toes left foot ? ?Debridement  of nails  1-5  B/L with a nail nipper.  Nails were then filed using a dremel tool with no incidents.    RTC 4   months  ? ?Gardiner Barefoot DPM  ?

## 2021-09-06 DIAGNOSIS — J45909 Unspecified asthma, uncomplicated: Secondary | ICD-10-CM | POA: Diagnosis not present

## 2021-09-06 DIAGNOSIS — Z79899 Other long term (current) drug therapy: Secondary | ICD-10-CM | POA: Diagnosis not present

## 2021-09-06 DIAGNOSIS — Z6841 Body Mass Index (BMI) 40.0 and over, adult: Secondary | ICD-10-CM | POA: Diagnosis not present

## 2021-09-06 DIAGNOSIS — G40909 Epilepsy, unspecified, not intractable, without status epilepticus: Secondary | ICD-10-CM | POA: Diagnosis not present

## 2021-09-06 DIAGNOSIS — M797 Fibromyalgia: Secondary | ICD-10-CM | POA: Diagnosis not present

## 2021-09-06 DIAGNOSIS — Z7969 Long term (current) use of other immunomodulators and immunosuppressants: Secondary | ICD-10-CM | POA: Diagnosis not present

## 2021-09-06 DIAGNOSIS — D84821 Immunodeficiency due to drugs: Secondary | ICD-10-CM | POA: Diagnosis not present

## 2021-09-06 DIAGNOSIS — F259 Schizoaffective disorder, unspecified: Secondary | ICD-10-CM | POA: Diagnosis not present

## 2021-09-06 DIAGNOSIS — D849 Immunodeficiency, unspecified: Secondary | ICD-10-CM | POA: Diagnosis not present

## 2021-09-06 DIAGNOSIS — Z23 Encounter for immunization: Secondary | ICD-10-CM | POA: Diagnosis not present

## 2021-09-06 DIAGNOSIS — Z800 Family history of malignant neoplasm of digestive organs: Secondary | ICD-10-CM | POA: Diagnosis not present

## 2021-09-06 DIAGNOSIS — Z944 Liver transplant status: Secondary | ICD-10-CM | POA: Diagnosis not present

## 2021-09-06 DIAGNOSIS — Z4823 Encounter for aftercare following liver transplant: Secondary | ICD-10-CM | POA: Diagnosis not present

## 2021-09-06 DIAGNOSIS — Z9181 History of falling: Secondary | ICD-10-CM | POA: Diagnosis not present

## 2021-09-06 DIAGNOSIS — F819 Developmental disorder of scholastic skills, unspecified: Secondary | ICD-10-CM | POA: Diagnosis not present

## 2021-09-06 DIAGNOSIS — Z79621 Long term (current) use of calcineurin inhibitor: Secondary | ICD-10-CM | POA: Diagnosis not present

## 2021-09-06 DIAGNOSIS — E079 Disorder of thyroid, unspecified: Secondary | ICD-10-CM | POA: Diagnosis not present

## 2021-09-06 DIAGNOSIS — Z7951 Long term (current) use of inhaled steroids: Secondary | ICD-10-CM | POA: Diagnosis not present

## 2021-09-06 DIAGNOSIS — K219 Gastro-esophageal reflux disease without esophagitis: Secondary | ICD-10-CM | POA: Diagnosis not present

## 2021-09-07 DIAGNOSIS — J45909 Unspecified asthma, uncomplicated: Secondary | ICD-10-CM | POA: Diagnosis not present

## 2021-09-08 ENCOUNTER — Telehealth: Payer: Self-pay

## 2021-09-08 DIAGNOSIS — J45909 Unspecified asthma, uncomplicated: Secondary | ICD-10-CM | POA: Diagnosis not present

## 2021-09-08 NOTE — Telephone Encounter (Signed)
Patient called in - DOB verified - has questions about Pneumovax she received at [facility] on 09/06/21. Patient stated she was having some swelling around injection area and she wasn't feeling well. Due to patient's recent transplant, patient was advised to contact [facility] immediately and advised them of her symptoms. Patient verbalized understanding - stated she would, no further questions. ?

## 2021-09-10 NOTE — Progress Notes (Signed)
? ? ?SUBJECTIVE:  ? ?CHIEF COMPLAINT / HPI: cut on finger, labs and follow-up for arm swelling ? ?Arm swelling ?Patient was seen in the [facility] system for follow-up of her transplant care.  Patient was administered pneumonia vaccine.  She reports that the following morning she experienced left arm swelling at the injection site as well as pruritus.  She contacted her providers and was instructed to take 2 doses of Benadryl and to proceed to the ED for evaluation if swelling did not improve.  Patient states the swelling indeed decreased and so she did not get evaluated in the ED.  Today, she states that she continues to have some pruritus and that the swelling has improved.  She denies any numbness or tingling in the left arm.  She denies pain at this time. ? ?Hypothyroidism ?Patient presents for TSH testing.  She reports adherence to her 50 mcg of levothyroxine daily.  Patient denies palpitations, heat intolerance.  Patient states that she did have a period where she was intolerant to cold temperatures but that has resolved.  She denies problems with constipation or diarrhea.  She denies feelings of depression or anxiety. ? ?Cut on finger ?Patient reports injuring her third digit on her left hand.  She reports that this has not 4 days ago while she was trying to open a bag with a knife and it slipped and cut the dorsal aspect of her finger.  She states that it bled at the time but was stopped with pressure.  She reports that she has been applying a Band-Aid to keep the area clean.  She denies any numbness or tingling in the finger.  She denies pain at this time.  She denies any swelling or purulent drainage. ?Patient is up to date with tdap vaccine, last documented in 04/27/2021.  ? ?PERTINENT  PMH / PSH:  ?Liver transplant recipient ?Asthma ?Primary biliary cirrhosis ?Clinical hypothyroidism ?Seizure disorder ? ?OBJECTIVE:  ? ?BP 120/80   Pulse 83   Temp 97.7 ?F (36.5 ?C)   Wt 219 lb 9.6 oz (99.6 kg)    SpO2 100%   BMI 44.35 kg/m?   ?Physical Exam ?Constitutional:   ?   General: She is not in acute distress. ?   Appearance: Normal appearance.  ?Cardiovascular:  ?   Rate and Rhythm: Normal rate and regular rhythm.  ?   Pulses: Normal pulses.  ?   Heart sounds: Normal heart sounds.  ?Pulmonary:  ?   Effort: Pulmonary effort is normal.  ?   Breath sounds: Normal breath sounds. No wheezing, rhonchi or rales.  ?Abdominal:  ?   General: Bowel sounds are normal.  ?   Tenderness: There is no abdominal tenderness.  ?Musculoskeletal:  ?   Comments: Left upper extremity with minimal redness at lateral deltoid, no warmth to touch, normal range of motion of the shoulder and left upper extremity, no tenderness to palpation, minimal induration at injection site, no drainage, no breaks in skin  ?Skin: ?   General: Skin is warm and dry.  ?   Capillary Refill: Capillary refill takes less than 2 seconds.  ?   Findings: Erythema present.  ?   Comments: Mild erythema at injection site  ?Neurological:  ?   Mental Status: She is alert and oriented to person, place, and time.  ?   Motor: No weakness.  ? ? ?ASSESSMENT/PLAN:  ? ?Subclinical hypothyroidism ?Adherent to levothyroxine 91mg daily  ?Will check TSH today  ? ? ?Laceration  of left middle finger without foreign body without damage to nail ?Shallow laceration sustained during incident with opening her back with a knife ?Patient does not need stitches for this injury and does not show any signs of infection ?Counseled patient to keep the area clean and avoid using peroxide or Neosporin, recommend keeping injury covered with Band-Aid during the day ?Counseled to return to care if she notices increased redness, swelling, purulent drainage or persistent bleeding or any changes to sensation of finger, patient voiced understanding ? ?Allergic reaction to vaccine ?Patient experienced swelling and redness of left deltoid area following pneumonia vaccination on 4/12 ?Patient followed up  with [name] providers and took 2 doses of Benadryl ?Swelling has improved today she is without tenderness and has normal range of motion in this area ?Counseled patient on return to care precautions including increased swelling or redness at the site, patient voiced understanding ?  ?Patient scheduled for follow-up to obtain Pap smear in May 2023 ? ?Eulis Foster, MD ?[place]  ?

## 2021-09-11 ENCOUNTER — Encounter: Payer: Self-pay | Admitting: Family Medicine

## 2021-09-11 ENCOUNTER — Ambulatory Visit (INDEPENDENT_AMBULATORY_CARE_PROVIDER_SITE_OTHER): Payer: Medicaid Other | Admitting: Family Medicine

## 2021-09-11 VITALS — BP 120/80 | HR 83 | Temp 97.7°F | Wt 219.6 lb

## 2021-09-11 DIAGNOSIS — Z7689 Persons encountering health services in other specified circumstances: Secondary | ICD-10-CM | POA: Diagnosis not present

## 2021-09-11 DIAGNOSIS — S61213A Laceration without foreign body of left middle finger without damage to nail, initial encounter: Secondary | ICD-10-CM | POA: Insufficient documentation

## 2021-09-11 DIAGNOSIS — E038 Other specified hypothyroidism: Secondary | ICD-10-CM | POA: Diagnosis not present

## 2021-09-11 DIAGNOSIS — T8069XA Other serum reaction due to other serum, initial encounter: Secondary | ICD-10-CM

## 2021-09-11 DIAGNOSIS — J45909 Unspecified asthma, uncomplicated: Secondary | ICD-10-CM | POA: Diagnosis not present

## 2021-09-11 HISTORY — DX: Other serum reaction due to other serum, initial encounter: T80.69XA

## 2021-09-11 NOTE — Patient Instructions (Signed)
It was a pleasure to see you today! ? ?Thank you for choosing [facility] for your primary care.  ? ?[name] was seen for injury, follow-up, arm swelling and hypothyroidism.  ? ?Our plans for today were: ?Today we will check your TSH levels.  Please continue to take your thyroid medication as prescribed. ?For your finger, recommend continuing to keep the cut clean and covered with a Band-Aid.  Please notify our office if you notice swelling, drainage of fluid or persistent bleeding or if you begin to have any numbness or tingling in your fingers or increased redness. ? ? ?Please return as scheduled for your Pap smear. ? ?Best Wishes,  ? ?Dr. Alba Cory  ? ?

## 2021-09-11 NOTE — Assessment & Plan Note (Signed)
Patient experienced swelling and redness of left deltoid area following pneumonia vaccination on 4/12 ?Patient followed up with [name] providers and took 2 doses of Benadryl ?Swelling has improved today she is without tenderness and has normal range of motion in this area ?Counseled patient on return to care precautions including increased swelling or redness at the site, patient voiced understanding ?

## 2021-09-11 NOTE — Assessment & Plan Note (Signed)
Shallow laceration sustained during incident with opening her back with a knife ?Patient does not need stitches for this injury and does not show any signs of infection ?Counseled patient to keep the area clean and avoid using peroxide or Neosporin, recommend keeping injury covered with Band-Aid during the day ?Counseled to return to care if she notices increased redness, swelling, purulent drainage or persistent bleeding or any changes to sensation of finger, patient voiced understanding ?

## 2021-09-11 NOTE — Assessment & Plan Note (Signed)
Adherent to levothyroxine 60mg daily  ?Will check TSH today  ? ?

## 2021-09-12 DIAGNOSIS — J45909 Unspecified asthma, uncomplicated: Secondary | ICD-10-CM | POA: Diagnosis not present

## 2021-09-12 LAB — TSH: TSH: 5 u[IU]/mL — ABNORMAL HIGH (ref 0.450–4.500)

## 2021-09-13 DIAGNOSIS — M797 Fibromyalgia: Secondary | ICD-10-CM | POA: Diagnosis not present

## 2021-09-13 DIAGNOSIS — F819 Developmental disorder of scholastic skills, unspecified: Secondary | ICD-10-CM | POA: Diagnosis not present

## 2021-09-13 DIAGNOSIS — J45909 Unspecified asthma, uncomplicated: Secondary | ICD-10-CM | POA: Diagnosis not present

## 2021-09-13 DIAGNOSIS — Z7951 Long term (current) use of inhaled steroids: Secondary | ICD-10-CM | POA: Diagnosis not present

## 2021-09-13 DIAGNOSIS — Z944 Liver transplant status: Secondary | ICD-10-CM | POA: Diagnosis not present

## 2021-09-13 DIAGNOSIS — Z9181 History of falling: Secondary | ICD-10-CM | POA: Diagnosis not present

## 2021-09-13 DIAGNOSIS — E079 Disorder of thyroid, unspecified: Secondary | ICD-10-CM | POA: Diagnosis not present

## 2021-09-13 DIAGNOSIS — G40909 Epilepsy, unspecified, not intractable, without status epilepticus: Secondary | ICD-10-CM | POA: Diagnosis not present

## 2021-09-13 DIAGNOSIS — F259 Schizoaffective disorder, unspecified: Secondary | ICD-10-CM | POA: Diagnosis not present

## 2021-09-13 DIAGNOSIS — K219 Gastro-esophageal reflux disease without esophagitis: Secondary | ICD-10-CM | POA: Diagnosis not present

## 2021-09-14 ENCOUNTER — Ambulatory Visit (INDEPENDENT_AMBULATORY_CARE_PROVIDER_SITE_OTHER): Payer: Medicaid Other | Admitting: Family Medicine

## 2021-09-14 ENCOUNTER — Other Ambulatory Visit: Payer: Self-pay | Admitting: Family Medicine

## 2021-09-14 DIAGNOSIS — Z6841 Body Mass Index (BMI) 40.0 and over, adult: Secondary | ICD-10-CM | POA: Diagnosis not present

## 2021-09-14 DIAGNOSIS — Z7689 Persons encountering health services in other specified circumstances: Secondary | ICD-10-CM | POA: Diagnosis not present

## 2021-09-14 DIAGNOSIS — E038 Other specified hypothyroidism: Secondary | ICD-10-CM

## 2021-09-14 DIAGNOSIS — J45909 Unspecified asthma, uncomplicated: Secondary | ICD-10-CM | POA: Diagnosis not present

## 2021-09-14 NOTE — Progress Notes (Signed)
? ? ?  Reviewed SMART goals with patient. She has been trying to stick to her SMART goals as much as possible. She brought in a 3 day diet plan which she found from [place]. She has been using the air fryer to cook some foods at home. Her protein intake consists of eggs, chicken wings, pork chops and fish. For breaded foods she has hush puppies and chicken wings. For veggies she has relies on frozen. She often has lipton teas, coffee and juice.  ? ?Relevant history/background: History of PBC s/p liver transplant. Hypothyroidism. Developmental delay. ? ?Assessment:  ?Usual physical activity: pt has been walking 2 x week inside the house and dance videos 4 x a week, 30-40 mins ?24-hr recall:  ?(Up at 9 AM)  ?B (11 AM)-  1 hard boiled egg, 1 slice of toast, tea-liptons   ?Snk-    ---    ?L (12 PM)-   1 boiled egg, 1 slice of toast, coffee-2 teaspoons sugar, 2 cups cauliflower, broccoli and carrot  ?Snk     --- ?D (5-6PM)- 12 oz can of tuna, mixed veggies-2 cups cauliflower, broccoli and carrot, 3oz blue bell vanilla ice-cream  ?Snk   ---  ?Typical day? Yes.   Drank 48 oz of water yesterday, but usually has 64-96 oz/day.   ? ?Intervention: Completed diet and exercise history, and established: ?- Today's SMART goals:  ?Include vegetables for both lunch and dinner.  This includes NON-breaded, non-fried vegetables, which can be either fresh or frozen.   ?For each meal make sure you have some protein food For example. A good portion size of protein food is:  ?- 4-8 oz of Greek yogurt  ?- 1 full cup of milk  ?- 2 eggs ?- 2 oz cheese  ?- Meat, fish or poultry the size of deck of cards ?- One full cup of beans  ?3.   Limit sugary drinks- Limit tea to 3 times a week, limit coffee to 1 cup a day, EAT fruit instead of drinking juice.   ? ?-Documenting progress on goals with goals sheet provided  ? ?For recommendations and goals, see Patient Instructions.   ? ?Follow-up: 7 weeks in Nutrition Clinic.   ? ?Obdulio Mash ? ?

## 2021-09-14 NOTE — Patient Instructions (Addendum)
Thank you for coming to see me today. It was a pleasure. Today we discussed your goals for your diet. I recommend: ? ?Include vegetables for both lunch and dinner.  This includes NON-breaded, non-fried vegetables, which can be either fresh or frozen.   ?For each meal make sure you have some protein food For example. A good portion size of protein food is:  ?4-8 oz of [place] yogurt  ?1 full cup of milk  ?2 eggs ?2 oz cheese  ?Meat, fish or poultry the size of deck of cards ?One full cup of beans  ?3. Limit sugary drinks-limit tea to 3 times a week, limit 1 cup of coffee a day, eat fruit instead of drink it ? ?-HIGH sugar turns to fat and will cause liver damage and weight gain  ? ?Please follow-up with me in 7 weeks  ? ?If you have any questions or concerns, please do not hesitate to call the office at [phone]. ? ?Best wishes,  ? ?Dr Posey Pronto   ?

## 2021-09-15 DIAGNOSIS — J45909 Unspecified asthma, uncomplicated: Secondary | ICD-10-CM | POA: Diagnosis not present

## 2021-09-18 DIAGNOSIS — J45909 Unspecified asthma, uncomplicated: Secondary | ICD-10-CM | POA: Diagnosis not present

## 2021-09-19 DIAGNOSIS — J45909 Unspecified asthma, uncomplicated: Secondary | ICD-10-CM | POA: Diagnosis not present

## 2021-09-20 ENCOUNTER — Other Ambulatory Visit: Payer: Self-pay | Admitting: Allergy and Immunology

## 2021-09-20 DIAGNOSIS — Z944 Liver transplant status: Secondary | ICD-10-CM | POA: Diagnosis not present

## 2021-09-20 DIAGNOSIS — F259 Schizoaffective disorder, unspecified: Secondary | ICD-10-CM | POA: Diagnosis not present

## 2021-09-20 DIAGNOSIS — G40909 Epilepsy, unspecified, not intractable, without status epilepticus: Secondary | ICD-10-CM | POA: Diagnosis not present

## 2021-09-20 DIAGNOSIS — Z9181 History of falling: Secondary | ICD-10-CM | POA: Diagnosis not present

## 2021-09-20 DIAGNOSIS — E079 Disorder of thyroid, unspecified: Secondary | ICD-10-CM | POA: Diagnosis not present

## 2021-09-20 DIAGNOSIS — K219 Gastro-esophageal reflux disease without esophagitis: Secondary | ICD-10-CM | POA: Diagnosis not present

## 2021-09-20 DIAGNOSIS — J45909 Unspecified asthma, uncomplicated: Secondary | ICD-10-CM | POA: Diagnosis not present

## 2021-09-20 DIAGNOSIS — Z7951 Long term (current) use of inhaled steroids: Secondary | ICD-10-CM | POA: Diagnosis not present

## 2021-09-20 DIAGNOSIS — M797 Fibromyalgia: Secondary | ICD-10-CM | POA: Diagnosis not present

## 2021-09-20 DIAGNOSIS — F819 Developmental disorder of scholastic skills, unspecified: Secondary | ICD-10-CM | POA: Diagnosis not present

## 2021-09-21 DIAGNOSIS — J45909 Unspecified asthma, uncomplicated: Secondary | ICD-10-CM | POA: Diagnosis not present

## 2021-09-22 DIAGNOSIS — F259 Schizoaffective disorder, unspecified: Secondary | ICD-10-CM | POA: Diagnosis not present

## 2021-09-22 DIAGNOSIS — Z944 Liver transplant status: Secondary | ICD-10-CM | POA: Diagnosis not present

## 2021-09-22 DIAGNOSIS — Z9181 History of falling: Secondary | ICD-10-CM | POA: Diagnosis not present

## 2021-09-22 DIAGNOSIS — J45909 Unspecified asthma, uncomplicated: Secondary | ICD-10-CM | POA: Diagnosis not present

## 2021-09-22 DIAGNOSIS — G40909 Epilepsy, unspecified, not intractable, without status epilepticus: Secondary | ICD-10-CM | POA: Diagnosis not present

## 2021-09-22 DIAGNOSIS — Z7951 Long term (current) use of inhaled steroids: Secondary | ICD-10-CM | POA: Diagnosis not present

## 2021-09-22 DIAGNOSIS — F819 Developmental disorder of scholastic skills, unspecified: Secondary | ICD-10-CM | POA: Diagnosis not present

## 2021-09-22 DIAGNOSIS — E079 Disorder of thyroid, unspecified: Secondary | ICD-10-CM | POA: Diagnosis not present

## 2021-09-22 DIAGNOSIS — K219 Gastro-esophageal reflux disease without esophagitis: Secondary | ICD-10-CM | POA: Diagnosis not present

## 2021-09-22 DIAGNOSIS — M797 Fibromyalgia: Secondary | ICD-10-CM | POA: Diagnosis not present

## 2021-09-25 DIAGNOSIS — J45909 Unspecified asthma, uncomplicated: Secondary | ICD-10-CM | POA: Diagnosis not present

## 2021-09-26 DIAGNOSIS — J45909 Unspecified asthma, uncomplicated: Secondary | ICD-10-CM | POA: Diagnosis not present

## 2021-09-27 ENCOUNTER — Telehealth: Payer: Self-pay

## 2021-09-27 DIAGNOSIS — Z9181 History of falling: Secondary | ICD-10-CM | POA: Diagnosis not present

## 2021-09-27 DIAGNOSIS — E079 Disorder of thyroid, unspecified: Secondary | ICD-10-CM | POA: Diagnosis not present

## 2021-09-27 DIAGNOSIS — Z7951 Long term (current) use of inhaled steroids: Secondary | ICD-10-CM | POA: Diagnosis not present

## 2021-09-27 DIAGNOSIS — M797 Fibromyalgia: Secondary | ICD-10-CM | POA: Diagnosis not present

## 2021-09-27 DIAGNOSIS — F259 Schizoaffective disorder, unspecified: Secondary | ICD-10-CM | POA: Diagnosis not present

## 2021-09-27 DIAGNOSIS — Z944 Liver transplant status: Secondary | ICD-10-CM | POA: Diagnosis not present

## 2021-09-27 DIAGNOSIS — G40909 Epilepsy, unspecified, not intractable, without status epilepticus: Secondary | ICD-10-CM | POA: Diagnosis not present

## 2021-09-27 DIAGNOSIS — J45909 Unspecified asthma, uncomplicated: Secondary | ICD-10-CM | POA: Diagnosis not present

## 2021-09-27 DIAGNOSIS — F819 Developmental disorder of scholastic skills, unspecified: Secondary | ICD-10-CM | POA: Diagnosis not present

## 2021-09-27 DIAGNOSIS — K219 Gastro-esophageal reflux disease without esophagitis: Secondary | ICD-10-CM | POA: Diagnosis not present

## 2021-09-27 NOTE — Progress Notes (Deleted)
    SUBJECTIVE:   CHIEF COMPLAINT / HPI: f/u BP, finger laceration  Elevated BP reading   ***  Patient presents for follow up regarding elevated BP from home health visit on 09/27/21.  She reports ***    Hypothyroidism  Patient had recently elevated TSH of 5. She reports adherence to synthroid dose of 43mg.   HLD Elevated LDL 120, TG 200HDL at goal 52 No current statin therapy   The 10-year ASCVD risk score (Arnett DK, et al., 2019) is: 1.2%   HM Needs colonoscopy and [facility]      PERTINENT  PMH / PSH:   Hypothyroidism  Asthma  Seizure D/o   OBJECTIVE:   There were no vitals taken for this visit.  Physical Exam Constitutional:      Appearance: She is normal weight.  HENT:     Right Ear: External ear normal.     Left Ear: External ear normal.     Nose: Nose normal.     Mouth/Throat:     Pharynx: Oropharynx is clear.  Cardiovascular:     Rate and Rhythm: Normal rate and regular rhythm.  Pulmonary:     Effort: Pulmonary effort is normal.  Abdominal:     General: Bowel sounds are normal.     Palpations: Abdomen is soft.  Musculoskeletal:     Cervical back: Normal range of motion.  Skin:    General: Skin is warm.  Neurological:     General: No focal deficit present.     Mental Status: She is alert and oriented to person, place, and time.    ASSESSMENT/PLAN:   No problem-specific Assessment & Plan notes found for this encounter.     MEulis Foster MD [place]

## 2021-09-27 NOTE — Telephone Encounter (Signed)
[name] RN calls nurse line reporting elevated BP at visit today. ? ?[name] reports a BP of 150/104 and asymptomatic.  ? ?[name] reports she denied any pain, headaches, vision changes, SOB or chest pain.  ? ?I attempted to call patient to check in. However, no answer.  ? ?Patient has an apt scheduled with PCP on 5/5. ?

## 2021-09-28 DIAGNOSIS — J45909 Unspecified asthma, uncomplicated: Secondary | ICD-10-CM | POA: Diagnosis not present

## 2021-09-29 ENCOUNTER — Ambulatory Visit: Payer: Medicaid Other | Admitting: Family Medicine

## 2021-09-29 DIAGNOSIS — J45909 Unspecified asthma, uncomplicated: Secondary | ICD-10-CM | POA: Diagnosis not present

## 2021-09-29 NOTE — Telephone Encounter (Signed)
Agree with in person evaluation. Patient rescheduled appointment scheduled for today. Recommend emergent evaluation if she develops HA, blurry vision, chest pain or unilateral weakness.  ? ?Eulis Foster, MD ?[place], PGY-3 ?[phone]  ? ?

## 2021-10-02 DIAGNOSIS — J45909 Unspecified asthma, uncomplicated: Secondary | ICD-10-CM | POA: Diagnosis not present

## 2021-10-03 DIAGNOSIS — J45909 Unspecified asthma, uncomplicated: Secondary | ICD-10-CM | POA: Diagnosis not present

## 2021-10-04 DIAGNOSIS — Z9181 History of falling: Secondary | ICD-10-CM | POA: Diagnosis not present

## 2021-10-04 DIAGNOSIS — Z7951 Long term (current) use of inhaled steroids: Secondary | ICD-10-CM | POA: Diagnosis not present

## 2021-10-04 DIAGNOSIS — F259 Schizoaffective disorder, unspecified: Secondary | ICD-10-CM | POA: Diagnosis not present

## 2021-10-04 DIAGNOSIS — E079 Disorder of thyroid, unspecified: Secondary | ICD-10-CM | POA: Diagnosis not present

## 2021-10-04 DIAGNOSIS — K219 Gastro-esophageal reflux disease without esophagitis: Secondary | ICD-10-CM | POA: Diagnosis not present

## 2021-10-04 DIAGNOSIS — G40909 Epilepsy, unspecified, not intractable, without status epilepticus: Secondary | ICD-10-CM | POA: Diagnosis not present

## 2021-10-04 DIAGNOSIS — M797 Fibromyalgia: Secondary | ICD-10-CM | POA: Diagnosis not present

## 2021-10-04 DIAGNOSIS — F819 Developmental disorder of scholastic skills, unspecified: Secondary | ICD-10-CM | POA: Diagnosis not present

## 2021-10-04 DIAGNOSIS — Z944 Liver transplant status: Secondary | ICD-10-CM | POA: Diagnosis not present

## 2021-10-04 DIAGNOSIS — J45909 Unspecified asthma, uncomplicated: Secondary | ICD-10-CM | POA: Diagnosis not present

## 2021-10-05 DIAGNOSIS — J45909 Unspecified asthma, uncomplicated: Secondary | ICD-10-CM | POA: Diagnosis not present

## 2021-10-06 ENCOUNTER — Encounter: Payer: Self-pay | Admitting: Podiatry

## 2021-10-06 ENCOUNTER — Ambulatory Visit: Payer: Medicaid Other | Admitting: Podiatry

## 2021-10-06 DIAGNOSIS — S90122A Contusion of left lesser toe(s) without damage to nail, initial encounter: Secondary | ICD-10-CM | POA: Diagnosis not present

## 2021-10-06 DIAGNOSIS — M79674 Pain in right toe(s): Secondary | ICD-10-CM

## 2021-10-06 DIAGNOSIS — B351 Tinea unguium: Secondary | ICD-10-CM | POA: Diagnosis not present

## 2021-10-06 DIAGNOSIS — J45909 Unspecified asthma, uncomplicated: Secondary | ICD-10-CM | POA: Diagnosis not present

## 2021-10-06 DIAGNOSIS — M79675 Pain in left toe(s): Secondary | ICD-10-CM

## 2021-10-06 NOTE — Progress Notes (Signed)
This patient presents to the office saying she has injured her third toenail left foot.  She injured her nail over a week ago and it has improved.  She presents to the office to have her injured toenail evaluated. ? ?Vascular  Dorsalis pedis and posterior tibial pulses are palpable  B/L.  Capillary return  WNL.  Temperature gradient is  WNL.  Skin turgor  WNL ? ?Sensorium  Senn Weinstein monofilament wire  WNL. Normal tactile sensation. ? ?Nail Exam  Patient has normal nails with no evidence of bacterial or fungal infection. ? ?Orthopedic  Exam  Muscle tone and muscle strength  WNL.  No limitations of motion feet  B/L.  No crepitus or joint effusion noted.  Foot type is unremarkable and digits show no abnormalities.  Bony prominences are unremarkable. ? ?Skin  No open lesions.  Normal skin texture and turgor.  ? ?Contusion toenail left foot. ? ?ROV.  Evaluated her toe and it is healing uneventfully with no redness, swelling or pain. ? ?RTC 4 months ? ? ?Gardiner Barefoot DPM  ?

## 2021-10-09 DIAGNOSIS — J45909 Unspecified asthma, uncomplicated: Secondary | ICD-10-CM | POA: Diagnosis not present

## 2021-10-10 DIAGNOSIS — J45909 Unspecified asthma, uncomplicated: Secondary | ICD-10-CM | POA: Diagnosis not present

## 2021-10-11 DIAGNOSIS — M797 Fibromyalgia: Secondary | ICD-10-CM | POA: Diagnosis not present

## 2021-10-11 DIAGNOSIS — Z7951 Long term (current) use of inhaled steroids: Secondary | ICD-10-CM | POA: Diagnosis not present

## 2021-10-11 DIAGNOSIS — F819 Developmental disorder of scholastic skills, unspecified: Secondary | ICD-10-CM | POA: Diagnosis not present

## 2021-10-11 DIAGNOSIS — J45909 Unspecified asthma, uncomplicated: Secondary | ICD-10-CM | POA: Diagnosis not present

## 2021-10-11 DIAGNOSIS — E079 Disorder of thyroid, unspecified: Secondary | ICD-10-CM | POA: Diagnosis not present

## 2021-10-11 DIAGNOSIS — Z944 Liver transplant status: Secondary | ICD-10-CM | POA: Diagnosis not present

## 2021-10-11 DIAGNOSIS — G40909 Epilepsy, unspecified, not intractable, without status epilepticus: Secondary | ICD-10-CM | POA: Diagnosis not present

## 2021-10-11 DIAGNOSIS — Z9181 History of falling: Secondary | ICD-10-CM | POA: Diagnosis not present

## 2021-10-11 DIAGNOSIS — F259 Schizoaffective disorder, unspecified: Secondary | ICD-10-CM | POA: Diagnosis not present

## 2021-10-11 DIAGNOSIS — K219 Gastro-esophageal reflux disease without esophagitis: Secondary | ICD-10-CM | POA: Diagnosis not present

## 2021-10-12 DIAGNOSIS — J45909 Unspecified asthma, uncomplicated: Secondary | ICD-10-CM | POA: Diagnosis not present

## 2021-10-12 NOTE — Progress Notes (Deleted)
    SUBJECTIVE:   CHIEF COMPLAINT / HPI: hand wound and pap smear   Hand f/u  ***   HM Patient presents for pap smear  Last pap completed *** with results showing ***  Today patient denies vaginal discharge, pruritis, pain with intercourse, abnormal vaginal bleeding  She would like STI testing as she is currently sexually active*** She is using *** for contraception    PERTINENT  PMH / PSH: ***  OBJECTIVE:   There were no vitals taken for this visit.  ***Physical Exam  ASSESSMENT/PLAN:   No problem-specific Assessment & Plan notes found for this encounter.     Eulis Foster, MD [place]

## 2021-10-13 ENCOUNTER — Ambulatory Visit: Payer: Medicaid Other | Admitting: Family Medicine

## 2021-10-13 DIAGNOSIS — J45909 Unspecified asthma, uncomplicated: Secondary | ICD-10-CM | POA: Diagnosis not present

## 2021-10-16 DIAGNOSIS — J45909 Unspecified asthma, uncomplicated: Secondary | ICD-10-CM | POA: Diagnosis not present

## 2021-10-17 DIAGNOSIS — J45909 Unspecified asthma, uncomplicated: Secondary | ICD-10-CM | POA: Diagnosis not present

## 2021-10-18 DIAGNOSIS — E079 Disorder of thyroid, unspecified: Secondary | ICD-10-CM | POA: Diagnosis not present

## 2021-10-18 DIAGNOSIS — F819 Developmental disorder of scholastic skills, unspecified: Secondary | ICD-10-CM | POA: Diagnosis not present

## 2021-10-18 DIAGNOSIS — Z9181 History of falling: Secondary | ICD-10-CM | POA: Diagnosis not present

## 2021-10-18 DIAGNOSIS — Z944 Liver transplant status: Secondary | ICD-10-CM | POA: Diagnosis not present

## 2021-10-18 DIAGNOSIS — G40909 Epilepsy, unspecified, not intractable, without status epilepticus: Secondary | ICD-10-CM | POA: Diagnosis not present

## 2021-10-18 DIAGNOSIS — J45909 Unspecified asthma, uncomplicated: Secondary | ICD-10-CM | POA: Diagnosis not present

## 2021-10-18 DIAGNOSIS — F259 Schizoaffective disorder, unspecified: Secondary | ICD-10-CM | POA: Diagnosis not present

## 2021-10-18 DIAGNOSIS — K219 Gastro-esophageal reflux disease without esophagitis: Secondary | ICD-10-CM | POA: Diagnosis not present

## 2021-10-18 DIAGNOSIS — Z7951 Long term (current) use of inhaled steroids: Secondary | ICD-10-CM | POA: Diagnosis not present

## 2021-10-18 DIAGNOSIS — M797 Fibromyalgia: Secondary | ICD-10-CM | POA: Diagnosis not present

## 2021-10-20 DIAGNOSIS — J45909 Unspecified asthma, uncomplicated: Secondary | ICD-10-CM | POA: Diagnosis not present

## 2021-10-24 DIAGNOSIS — J45909 Unspecified asthma, uncomplicated: Secondary | ICD-10-CM | POA: Diagnosis not present

## 2021-10-25 DIAGNOSIS — J45909 Unspecified asthma, uncomplicated: Secondary | ICD-10-CM | POA: Diagnosis not present

## 2021-10-26 ENCOUNTER — Other Ambulatory Visit (INDEPENDENT_AMBULATORY_CARE_PROVIDER_SITE_OTHER): Payer: Medicaid Other | Admitting: Family Medicine

## 2021-10-26 DIAGNOSIS — Z944 Liver transplant status: Secondary | ICD-10-CM | POA: Diagnosis not present

## 2021-10-26 DIAGNOSIS — J45909 Unspecified asthma, uncomplicated: Secondary | ICD-10-CM | POA: Diagnosis not present

## 2021-10-26 DIAGNOSIS — G40909 Epilepsy, unspecified, not intractable, without status epilepticus: Secondary | ICD-10-CM | POA: Diagnosis not present

## 2021-10-26 DIAGNOSIS — F259 Schizoaffective disorder, unspecified: Secondary | ICD-10-CM | POA: Diagnosis not present

## 2021-10-26 DIAGNOSIS — Z7951 Long term (current) use of inhaled steroids: Secondary | ICD-10-CM | POA: Diagnosis not present

## 2021-10-26 DIAGNOSIS — E079 Disorder of thyroid, unspecified: Secondary | ICD-10-CM | POA: Diagnosis not present

## 2021-10-26 DIAGNOSIS — M797 Fibromyalgia: Secondary | ICD-10-CM | POA: Diagnosis not present

## 2021-10-26 DIAGNOSIS — E038 Other specified hypothyroidism: Secondary | ICD-10-CM

## 2021-10-26 DIAGNOSIS — K219 Gastro-esophageal reflux disease without esophagitis: Secondary | ICD-10-CM | POA: Diagnosis not present

## 2021-10-26 DIAGNOSIS — F819 Developmental disorder of scholastic skills, unspecified: Secondary | ICD-10-CM | POA: Diagnosis not present

## 2021-10-26 DIAGNOSIS — Z9181 History of falling: Secondary | ICD-10-CM | POA: Diagnosis not present

## 2021-10-29 NOTE — Progress Notes (Signed)
    SUBJECTIVE:   CHIEF COMPLAINT / HPI: hand f/u and pap smear   Hypothyroid  Results showed TSH elevated at 5.0.  Reports adherence with current dose of synthroid 66mg.  Patient is agreeable to dose change of synthroid based on abnormal TSH   Pap Smear Patient presents for pap smear  Last pap completed 2017 with results showing no abnormalities   Today patient denies vaginal discharge, pruritis or abnormal vaginal bleeding  She declines STI testing as she denies sexual activity    PERTINENT  PMH / PSH:  Liver transplant recipient  Asthma  Subclinical Hypothyroidism    OBJECTIVE:   BP 140/82   Pulse 86   Ht '4\' 11"'$  (1.499 m)   Wt 223 lb (101.2 kg)   LMP 02/25/2021   SpO2 99%   BMI 45.04 kg/m   Physical Exam HENT:     Right Ear: External ear normal.     Left Ear: External ear normal.     Nose: Nose normal.     Mouth/Throat:     Pharynx: Oropharynx is clear.  Cardiovascular:     Rate and Rhythm: Normal rate and regular rhythm.  Pulmonary:     Effort: Pulmonary effort is normal.  Abdominal:     General: Bowel sounds are normal.     Palpations: Abdomen is soft.  Genitourinary:    General: Normal vulva.     Vagina: No vaginal discharge.  Musculoskeletal:     Cervical back: Normal range of motion and neck supple.  Skin:    General: Skin is warm.  Neurological:     General: No focal deficit present.     Mental Status: She is alert and oriented to person, place, and time.     ASSESSMENT/PLAN:   Subclinical hypothyroidism Reviewed TSH elevation from previous levels  Increased synthroid dose to 738m  Will recheck TSH in 4-6 weeks   Screening for cervical cancer Collected pap smear today      MaEulis FosterMD [place]

## 2021-10-30 ENCOUNTER — Other Ambulatory Visit (HOSPITAL_COMMUNITY)
Admission: RE | Admit: 2021-10-30 | Discharge: 2021-10-30 | Disposition: A | Discharge status: Home / Self Care | Payer: Medicaid Other | Source: Ambulatory Visit | Attending: Family Medicine | Admitting: Family Medicine

## 2021-10-30 ENCOUNTER — Encounter: Payer: Self-pay | Admitting: Family Medicine

## 2021-10-30 ENCOUNTER — Ambulatory Visit (INDEPENDENT_AMBULATORY_CARE_PROVIDER_SITE_OTHER): Payer: Medicaid Other | Admitting: Family Medicine

## 2021-10-30 VITALS — BP 140/82 | HR 86 | Ht 59.0 in | Wt 223.0 lb

## 2021-10-30 DIAGNOSIS — Z124 Encounter for screening for malignant neoplasm of cervix: Secondary | ICD-10-CM | POA: Diagnosis not present

## 2021-10-30 DIAGNOSIS — Z7689 Persons encountering health services in other specified circumstances: Secondary | ICD-10-CM | POA: Diagnosis not present

## 2021-10-30 DIAGNOSIS — E038 Other specified hypothyroidism: Secondary | ICD-10-CM

## 2021-10-30 NOTE — Patient Instructions (Signed)
I will notify you of any abnormal results once they are available.   Today we collected a pap smear.   I have increased the dose of your synthroid medication to 52mg daily.   We will recheck your TSH level in 4 weeks so please schedule a follow up appointment in four weeks with me.

## 2021-10-31 ENCOUNTER — Encounter: Payer: Self-pay | Admitting: *Deleted

## 2021-11-01 ENCOUNTER — Encounter: Payer: Self-pay | Admitting: Family Medicine

## 2021-11-01 DIAGNOSIS — Z9181 History of falling: Secondary | ICD-10-CM | POA: Diagnosis not present

## 2021-11-01 DIAGNOSIS — M797 Fibromyalgia: Secondary | ICD-10-CM | POA: Diagnosis not present

## 2021-11-01 DIAGNOSIS — Z944 Liver transplant status: Secondary | ICD-10-CM | POA: Diagnosis not present

## 2021-11-01 DIAGNOSIS — Z124 Encounter for screening for malignant neoplasm of cervix: Secondary | ICD-10-CM | POA: Insufficient documentation

## 2021-11-01 DIAGNOSIS — G40909 Epilepsy, unspecified, not intractable, without status epilepticus: Secondary | ICD-10-CM | POA: Diagnosis not present

## 2021-11-01 DIAGNOSIS — E079 Disorder of thyroid, unspecified: Secondary | ICD-10-CM | POA: Diagnosis not present

## 2021-11-01 DIAGNOSIS — K219 Gastro-esophageal reflux disease without esophagitis: Secondary | ICD-10-CM | POA: Diagnosis not present

## 2021-11-01 DIAGNOSIS — F259 Schizoaffective disorder, unspecified: Secondary | ICD-10-CM | POA: Diagnosis not present

## 2021-11-01 DIAGNOSIS — J45909 Unspecified asthma, uncomplicated: Secondary | ICD-10-CM | POA: Diagnosis not present

## 2021-11-01 DIAGNOSIS — Z7951 Long term (current) use of inhaled steroids: Secondary | ICD-10-CM | POA: Diagnosis not present

## 2021-11-01 DIAGNOSIS — F819 Developmental disorder of scholastic skills, unspecified: Secondary | ICD-10-CM | POA: Diagnosis not present

## 2021-11-01 NOTE — Assessment & Plan Note (Signed)
Collected pap smear today

## 2021-11-01 NOTE — Assessment & Plan Note (Signed)
Reviewed TSH elevation from previous levels  Increased synthroid dose to 83mg  Will recheck TSH in 4-6 weeks

## 2021-11-02 ENCOUNTER — Ambulatory Visit: Payer: Medicaid Other | Admitting: Family Medicine

## 2021-11-03 LAB — CYTOLOGY - PAP: Adequacy: ABNORMAL

## 2021-11-06 DIAGNOSIS — J45909 Unspecified asthma, uncomplicated: Secondary | ICD-10-CM | POA: Diagnosis not present

## 2021-11-07 DIAGNOSIS — J45909 Unspecified asthma, uncomplicated: Secondary | ICD-10-CM | POA: Diagnosis not present

## 2021-11-08 DIAGNOSIS — F819 Developmental disorder of scholastic skills, unspecified: Secondary | ICD-10-CM | POA: Diagnosis not present

## 2021-11-08 DIAGNOSIS — Z9181 History of falling: Secondary | ICD-10-CM | POA: Diagnosis not present

## 2021-11-08 DIAGNOSIS — J45909 Unspecified asthma, uncomplicated: Secondary | ICD-10-CM | POA: Diagnosis not present

## 2021-11-08 DIAGNOSIS — K219 Gastro-esophageal reflux disease without esophagitis: Secondary | ICD-10-CM | POA: Diagnosis not present

## 2021-11-08 DIAGNOSIS — E079 Disorder of thyroid, unspecified: Secondary | ICD-10-CM | POA: Diagnosis not present

## 2021-11-08 DIAGNOSIS — Z7951 Long term (current) use of inhaled steroids: Secondary | ICD-10-CM | POA: Diagnosis not present

## 2021-11-08 DIAGNOSIS — G40909 Epilepsy, unspecified, not intractable, without status epilepticus: Secondary | ICD-10-CM | POA: Diagnosis not present

## 2021-11-08 DIAGNOSIS — M797 Fibromyalgia: Secondary | ICD-10-CM | POA: Diagnosis not present

## 2021-11-08 DIAGNOSIS — F259 Schizoaffective disorder, unspecified: Secondary | ICD-10-CM | POA: Diagnosis not present

## 2021-11-08 DIAGNOSIS — Z944 Liver transplant status: Secondary | ICD-10-CM | POA: Diagnosis not present

## 2021-11-09 DIAGNOSIS — J45909 Unspecified asthma, uncomplicated: Secondary | ICD-10-CM | POA: Diagnosis not present

## 2021-11-10 DIAGNOSIS — J45909 Unspecified asthma, uncomplicated: Secondary | ICD-10-CM | POA: Diagnosis not present

## 2021-11-14 DIAGNOSIS — J45909 Unspecified asthma, uncomplicated: Secondary | ICD-10-CM | POA: Diagnosis not present

## 2021-11-15 DIAGNOSIS — Z944 Liver transplant status: Secondary | ICD-10-CM | POA: Diagnosis not present

## 2021-11-15 DIAGNOSIS — M797 Fibromyalgia: Secondary | ICD-10-CM | POA: Diagnosis not present

## 2021-11-15 DIAGNOSIS — F819 Developmental disorder of scholastic skills, unspecified: Secondary | ICD-10-CM | POA: Diagnosis not present

## 2021-11-15 DIAGNOSIS — F259 Schizoaffective disorder, unspecified: Secondary | ICD-10-CM | POA: Diagnosis not present

## 2021-11-15 DIAGNOSIS — J45909 Unspecified asthma, uncomplicated: Secondary | ICD-10-CM | POA: Diagnosis not present

## 2021-11-15 DIAGNOSIS — E079 Disorder of thyroid, unspecified: Secondary | ICD-10-CM | POA: Diagnosis not present

## 2021-11-15 DIAGNOSIS — Z7951 Long term (current) use of inhaled steroids: Secondary | ICD-10-CM | POA: Diagnosis not present

## 2021-11-15 DIAGNOSIS — G40909 Epilepsy, unspecified, not intractable, without status epilepticus: Secondary | ICD-10-CM | POA: Diagnosis not present

## 2021-11-15 DIAGNOSIS — K219 Gastro-esophageal reflux disease without esophagitis: Secondary | ICD-10-CM | POA: Diagnosis not present

## 2021-11-15 DIAGNOSIS — Z9181 History of falling: Secondary | ICD-10-CM | POA: Diagnosis not present

## 2021-11-16 ENCOUNTER — Ambulatory Visit: Payer: Medicaid Other | Admitting: Family Medicine

## 2021-11-16 DIAGNOSIS — J45909 Unspecified asthma, uncomplicated: Secondary | ICD-10-CM | POA: Diagnosis not present

## 2021-11-20 DIAGNOSIS — J45909 Unspecified asthma, uncomplicated: Secondary | ICD-10-CM | POA: Diagnosis not present

## 2021-11-21 DIAGNOSIS — J45909 Unspecified asthma, uncomplicated: Secondary | ICD-10-CM | POA: Diagnosis not present

## 2021-11-22 DIAGNOSIS — G40909 Epilepsy, unspecified, not intractable, without status epilepticus: Secondary | ICD-10-CM | POA: Diagnosis not present

## 2021-11-22 DIAGNOSIS — Z9181 History of falling: Secondary | ICD-10-CM | POA: Diagnosis not present

## 2021-11-22 DIAGNOSIS — Z944 Liver transplant status: Secondary | ICD-10-CM | POA: Diagnosis not present

## 2021-11-22 DIAGNOSIS — Z7951 Long term (current) use of inhaled steroids: Secondary | ICD-10-CM | POA: Diagnosis not present

## 2021-11-22 DIAGNOSIS — K219 Gastro-esophageal reflux disease without esophagitis: Secondary | ICD-10-CM | POA: Diagnosis not present

## 2021-11-22 DIAGNOSIS — M797 Fibromyalgia: Secondary | ICD-10-CM | POA: Diagnosis not present

## 2021-11-22 DIAGNOSIS — F819 Developmental disorder of scholastic skills, unspecified: Secondary | ICD-10-CM | POA: Diagnosis not present

## 2021-11-22 DIAGNOSIS — J45909 Unspecified asthma, uncomplicated: Secondary | ICD-10-CM | POA: Diagnosis not present

## 2021-11-22 DIAGNOSIS — F259 Schizoaffective disorder, unspecified: Secondary | ICD-10-CM | POA: Diagnosis not present

## 2021-11-22 DIAGNOSIS — E079 Disorder of thyroid, unspecified: Secondary | ICD-10-CM | POA: Diagnosis not present

## 2021-11-23 DIAGNOSIS — J45909 Unspecified asthma, uncomplicated: Secondary | ICD-10-CM | POA: Diagnosis not present

## 2021-11-24 DIAGNOSIS — J45909 Unspecified asthma, uncomplicated: Secondary | ICD-10-CM | POA: Diagnosis not present

## 2021-11-26 NOTE — Progress Notes (Signed)
    SUBJECTIVE:   CHIEF COMPLAINT / HPI: repeat pap and recheck tsh studies  [name] presents today for a pap smear.   Patient's last menstrual period was 02/25/2021.  Last pap  Results were: NILM w/ HRHPV negative.  H/O abnormal pap: no  Sexually active: Denies  Denies abnormal vaginal discharge/itching/odor/irritation, problems with periods, bowel movements, urination   Hypothyroidism Patient recently had a TSH that was elevated it 5.0 We will recheck TSH and T4 Patient reports adherence to Synthroid 75 mcg daily  PERTINENT  PMH / PSH:  Hypothyroidism Asthma   OBJECTIVE:   BP 134/86   Pulse 74   Ht '4\' 11"'$  (1.499 m)   Wt 222 lb (100.7 kg)   LMP 02/25/2021   SpO2 100%   BMI 44.84 kg/m   Genitalia:  Normal introitus for age, no external lesions, no vaginal discharge, mucosa pink and moist, no vaginal or cervical lesions, no vaginal atrophy, no friaility or hemorrhage, normal uterus size and position, no adnexal masses or tenderness   ASSESSMENT/PLAN:   Subclinical hypothyroidism Repeat TSH and collect free T4  Will adjust synthroid dose as indicated   Pap smear for cervical cancer screening Pap smear collected today      Eulis Foster, MD [place]

## 2021-11-27 ENCOUNTER — Other Ambulatory Visit (HOSPITAL_COMMUNITY)
Admission: RE | Admit: 2021-11-27 | Discharge: 2021-11-27 | Disposition: A | Discharge status: Home / Self Care | Payer: Medicaid Other | Source: Ambulatory Visit | Attending: Family Medicine | Admitting: Family Medicine

## 2021-11-27 ENCOUNTER — Encounter: Payer: Self-pay | Admitting: Family Medicine

## 2021-11-27 ENCOUNTER — Ambulatory Visit (INDEPENDENT_AMBULATORY_CARE_PROVIDER_SITE_OTHER): Payer: Medicaid Other | Admitting: Family Medicine

## 2021-11-27 VITALS — BP 134/86 | HR 74 | Ht 59.0 in | Wt 222.0 lb

## 2021-11-27 DIAGNOSIS — J45909 Unspecified asthma, uncomplicated: Secondary | ICD-10-CM | POA: Diagnosis not present

## 2021-11-27 DIAGNOSIS — Z124 Encounter for screening for malignant neoplasm of cervix: Secondary | ICD-10-CM | POA: Insufficient documentation

## 2021-11-27 DIAGNOSIS — Z7689 Persons encountering health services in other specified circumstances: Secondary | ICD-10-CM | POA: Diagnosis not present

## 2021-11-27 DIAGNOSIS — E038 Other specified hypothyroidism: Secondary | ICD-10-CM | POA: Diagnosis not present

## 2021-11-27 HISTORY — DX: Encounter for screening for malignant neoplasm of cervix: Z12.4

## 2021-11-27 NOTE — Patient Instructions (Signed)
Today we completed your Pap smear.  I will notify you of results once they are available within the next 2 days.  We will also recheck your thyroid levels.

## 2021-11-27 NOTE — Assessment & Plan Note (Signed)
Pap smear collected today 

## 2021-11-27 NOTE — Assessment & Plan Note (Signed)
Repeat TSH and collect free T4  Will adjust synthroid dose as indicated

## 2021-11-28 LAB — TSH+FREE T4
Free T4: 1.78 ng/dL — ABNORMAL HIGH (ref 0.82–1.77)
TSH: 2.77 u[IU]/mL (ref 0.450–4.500)

## 2021-11-29 ENCOUNTER — Encounter: Payer: Self-pay | Admitting: Family Medicine

## 2021-11-29 DIAGNOSIS — J45909 Unspecified asthma, uncomplicated: Secondary | ICD-10-CM | POA: Diagnosis not present

## 2021-11-29 DIAGNOSIS — Z9181 History of falling: Secondary | ICD-10-CM | POA: Diagnosis not present

## 2021-11-29 DIAGNOSIS — Z944 Liver transplant status: Secondary | ICD-10-CM | POA: Diagnosis not present

## 2021-11-29 DIAGNOSIS — K219 Gastro-esophageal reflux disease without esophagitis: Secondary | ICD-10-CM | POA: Diagnosis not present

## 2021-11-29 DIAGNOSIS — Z7951 Long term (current) use of inhaled steroids: Secondary | ICD-10-CM | POA: Diagnosis not present

## 2021-11-29 DIAGNOSIS — G40909 Epilepsy, unspecified, not intractable, without status epilepticus: Secondary | ICD-10-CM | POA: Diagnosis not present

## 2021-11-29 DIAGNOSIS — F819 Developmental disorder of scholastic skills, unspecified: Secondary | ICD-10-CM | POA: Diagnosis not present

## 2021-11-29 DIAGNOSIS — F259 Schizoaffective disorder, unspecified: Secondary | ICD-10-CM | POA: Diagnosis not present

## 2021-11-29 DIAGNOSIS — M797 Fibromyalgia: Secondary | ICD-10-CM | POA: Diagnosis not present

## 2021-11-29 DIAGNOSIS — E079 Disorder of thyroid, unspecified: Secondary | ICD-10-CM | POA: Diagnosis not present

## 2021-11-29 LAB — CYTOLOGY - PAP
Comment: NEGATIVE
Diagnosis: NEGATIVE
High risk HPV: NEGATIVE

## 2021-11-30 DIAGNOSIS — J45909 Unspecified asthma, uncomplicated: Secondary | ICD-10-CM | POA: Diagnosis not present

## 2021-12-01 DIAGNOSIS — J45909 Unspecified asthma, uncomplicated: Secondary | ICD-10-CM | POA: Diagnosis not present

## 2021-12-04 DIAGNOSIS — J45909 Unspecified asthma, uncomplicated: Secondary | ICD-10-CM | POA: Diagnosis not present

## 2021-12-05 DIAGNOSIS — J45909 Unspecified asthma, uncomplicated: Secondary | ICD-10-CM | POA: Diagnosis not present

## 2021-12-06 DIAGNOSIS — Z7951 Long term (current) use of inhaled steroids: Secondary | ICD-10-CM | POA: Diagnosis not present

## 2021-12-06 DIAGNOSIS — F819 Developmental disorder of scholastic skills, unspecified: Secondary | ICD-10-CM | POA: Diagnosis not present

## 2021-12-06 DIAGNOSIS — K219 Gastro-esophageal reflux disease without esophagitis: Secondary | ICD-10-CM | POA: Diagnosis not present

## 2021-12-06 DIAGNOSIS — Z9181 History of falling: Secondary | ICD-10-CM | POA: Diagnosis not present

## 2021-12-06 DIAGNOSIS — Z944 Liver transplant status: Secondary | ICD-10-CM | POA: Diagnosis not present

## 2021-12-06 DIAGNOSIS — M797 Fibromyalgia: Secondary | ICD-10-CM | POA: Diagnosis not present

## 2021-12-06 DIAGNOSIS — E079 Disorder of thyroid, unspecified: Secondary | ICD-10-CM | POA: Diagnosis not present

## 2021-12-06 DIAGNOSIS — G40909 Epilepsy, unspecified, not intractable, without status epilepticus: Secondary | ICD-10-CM | POA: Diagnosis not present

## 2021-12-06 DIAGNOSIS — J45909 Unspecified asthma, uncomplicated: Secondary | ICD-10-CM | POA: Diagnosis not present

## 2021-12-06 DIAGNOSIS — F259 Schizoaffective disorder, unspecified: Secondary | ICD-10-CM | POA: Diagnosis not present

## 2021-12-08 DIAGNOSIS — J45909 Unspecified asthma, uncomplicated: Secondary | ICD-10-CM | POA: Diagnosis not present

## 2021-12-11 DIAGNOSIS — J45909 Unspecified asthma, uncomplicated: Secondary | ICD-10-CM | POA: Diagnosis not present

## 2021-12-12 DIAGNOSIS — J45909 Unspecified asthma, uncomplicated: Secondary | ICD-10-CM | POA: Diagnosis not present

## 2021-12-13 DIAGNOSIS — G40909 Epilepsy, unspecified, not intractable, without status epilepticus: Secondary | ICD-10-CM | POA: Diagnosis not present

## 2021-12-13 DIAGNOSIS — Z9181 History of falling: Secondary | ICD-10-CM | POA: Diagnosis not present

## 2021-12-13 DIAGNOSIS — Z7951 Long term (current) use of inhaled steroids: Secondary | ICD-10-CM | POA: Diagnosis not present

## 2021-12-13 DIAGNOSIS — K219 Gastro-esophageal reflux disease without esophagitis: Secondary | ICD-10-CM | POA: Diagnosis not present

## 2021-12-13 DIAGNOSIS — E079 Disorder of thyroid, unspecified: Secondary | ICD-10-CM | POA: Diagnosis not present

## 2021-12-13 DIAGNOSIS — Z944 Liver transplant status: Secondary | ICD-10-CM | POA: Diagnosis not present

## 2021-12-13 DIAGNOSIS — F259 Schizoaffective disorder, unspecified: Secondary | ICD-10-CM | POA: Diagnosis not present

## 2021-12-13 DIAGNOSIS — F819 Developmental disorder of scholastic skills, unspecified: Secondary | ICD-10-CM | POA: Diagnosis not present

## 2021-12-13 DIAGNOSIS — J45909 Unspecified asthma, uncomplicated: Secondary | ICD-10-CM | POA: Diagnosis not present

## 2021-12-13 DIAGNOSIS — M797 Fibromyalgia: Secondary | ICD-10-CM | POA: Diagnosis not present

## 2021-12-14 DIAGNOSIS — J45909 Unspecified asthma, uncomplicated: Secondary | ICD-10-CM | POA: Diagnosis not present

## 2021-12-15 DIAGNOSIS — J45909 Unspecified asthma, uncomplicated: Secondary | ICD-10-CM | POA: Diagnosis not present

## 2021-12-18 DIAGNOSIS — J45909 Unspecified asthma, uncomplicated: Secondary | ICD-10-CM | POA: Diagnosis not present

## 2021-12-20 DIAGNOSIS — Z7951 Long term (current) use of inhaled steroids: Secondary | ICD-10-CM | POA: Diagnosis not present

## 2021-12-20 DIAGNOSIS — Z944 Liver transplant status: Secondary | ICD-10-CM | POA: Diagnosis not present

## 2021-12-20 DIAGNOSIS — M797 Fibromyalgia: Secondary | ICD-10-CM | POA: Diagnosis not present

## 2021-12-20 DIAGNOSIS — K219 Gastro-esophageal reflux disease without esophagitis: Secondary | ICD-10-CM | POA: Diagnosis not present

## 2021-12-20 DIAGNOSIS — F259 Schizoaffective disorder, unspecified: Secondary | ICD-10-CM | POA: Diagnosis not present

## 2021-12-20 DIAGNOSIS — F819 Developmental disorder of scholastic skills, unspecified: Secondary | ICD-10-CM | POA: Diagnosis not present

## 2021-12-20 DIAGNOSIS — G40909 Epilepsy, unspecified, not intractable, without status epilepticus: Secondary | ICD-10-CM | POA: Diagnosis not present

## 2021-12-20 DIAGNOSIS — Z9181 History of falling: Secondary | ICD-10-CM | POA: Diagnosis not present

## 2021-12-20 DIAGNOSIS — J45909 Unspecified asthma, uncomplicated: Secondary | ICD-10-CM | POA: Diagnosis not present

## 2021-12-20 DIAGNOSIS — E079 Disorder of thyroid, unspecified: Secondary | ICD-10-CM | POA: Diagnosis not present

## 2021-12-21 DIAGNOSIS — J45909 Unspecified asthma, uncomplicated: Secondary | ICD-10-CM | POA: Diagnosis not present

## 2021-12-22 DIAGNOSIS — J45909 Unspecified asthma, uncomplicated: Secondary | ICD-10-CM | POA: Diagnosis not present

## 2021-12-25 DIAGNOSIS — Z9181 History of falling: Secondary | ICD-10-CM | POA: Diagnosis not present

## 2021-12-25 DIAGNOSIS — E079 Disorder of thyroid, unspecified: Secondary | ICD-10-CM | POA: Diagnosis not present

## 2021-12-25 DIAGNOSIS — K219 Gastro-esophageal reflux disease without esophagitis: Secondary | ICD-10-CM | POA: Diagnosis not present

## 2021-12-25 DIAGNOSIS — Z944 Liver transplant status: Secondary | ICD-10-CM | POA: Diagnosis not present

## 2021-12-25 DIAGNOSIS — M797 Fibromyalgia: Secondary | ICD-10-CM | POA: Diagnosis not present

## 2021-12-25 DIAGNOSIS — F819 Developmental disorder of scholastic skills, unspecified: Secondary | ICD-10-CM | POA: Diagnosis not present

## 2021-12-25 DIAGNOSIS — J45909 Unspecified asthma, uncomplicated: Secondary | ICD-10-CM | POA: Diagnosis not present

## 2021-12-25 DIAGNOSIS — Z7951 Long term (current) use of inhaled steroids: Secondary | ICD-10-CM | POA: Diagnosis not present

## 2021-12-25 DIAGNOSIS — F259 Schizoaffective disorder, unspecified: Secondary | ICD-10-CM | POA: Diagnosis not present

## 2021-12-25 DIAGNOSIS — G40909 Epilepsy, unspecified, not intractable, without status epilepticus: Secondary | ICD-10-CM | POA: Diagnosis not present

## 2021-12-27 DIAGNOSIS — F259 Schizoaffective disorder, unspecified: Secondary | ICD-10-CM | POA: Diagnosis not present

## 2021-12-27 DIAGNOSIS — K219 Gastro-esophageal reflux disease without esophagitis: Secondary | ICD-10-CM | POA: Diagnosis not present

## 2021-12-27 DIAGNOSIS — Z944 Liver transplant status: Secondary | ICD-10-CM | POA: Diagnosis not present

## 2021-12-27 DIAGNOSIS — M797 Fibromyalgia: Secondary | ICD-10-CM | POA: Diagnosis not present

## 2021-12-27 DIAGNOSIS — E079 Disorder of thyroid, unspecified: Secondary | ICD-10-CM | POA: Diagnosis not present

## 2021-12-27 DIAGNOSIS — Z9181 History of falling: Secondary | ICD-10-CM | POA: Diagnosis not present

## 2021-12-27 DIAGNOSIS — F819 Developmental disorder of scholastic skills, unspecified: Secondary | ICD-10-CM | POA: Diagnosis not present

## 2021-12-27 DIAGNOSIS — Z7951 Long term (current) use of inhaled steroids: Secondary | ICD-10-CM | POA: Diagnosis not present

## 2021-12-27 DIAGNOSIS — G40909 Epilepsy, unspecified, not intractable, without status epilepticus: Secondary | ICD-10-CM | POA: Diagnosis not present

## 2021-12-27 DIAGNOSIS — J45909 Unspecified asthma, uncomplicated: Secondary | ICD-10-CM | POA: Diagnosis not present

## 2021-12-28 DIAGNOSIS — J45909 Unspecified asthma, uncomplicated: Secondary | ICD-10-CM | POA: Diagnosis not present

## 2021-12-29 DIAGNOSIS — J45909 Unspecified asthma, uncomplicated: Secondary | ICD-10-CM | POA: Diagnosis not present

## 2022-01-01 DIAGNOSIS — J45909 Unspecified asthma, uncomplicated: Secondary | ICD-10-CM | POA: Diagnosis not present

## 2022-01-03 DIAGNOSIS — K219 Gastro-esophageal reflux disease without esophagitis: Secondary | ICD-10-CM | POA: Diagnosis not present

## 2022-01-03 DIAGNOSIS — M797 Fibromyalgia: Secondary | ICD-10-CM | POA: Diagnosis not present

## 2022-01-03 DIAGNOSIS — J45909 Unspecified asthma, uncomplicated: Secondary | ICD-10-CM | POA: Diagnosis not present

## 2022-01-03 DIAGNOSIS — F259 Schizoaffective disorder, unspecified: Secondary | ICD-10-CM | POA: Diagnosis not present

## 2022-01-03 DIAGNOSIS — Z7951 Long term (current) use of inhaled steroids: Secondary | ICD-10-CM | POA: Diagnosis not present

## 2022-01-03 DIAGNOSIS — Z944 Liver transplant status: Secondary | ICD-10-CM | POA: Diagnosis not present

## 2022-01-03 DIAGNOSIS — Z9181 History of falling: Secondary | ICD-10-CM | POA: Diagnosis not present

## 2022-01-03 DIAGNOSIS — G40909 Epilepsy, unspecified, not intractable, without status epilepticus: Secondary | ICD-10-CM | POA: Diagnosis not present

## 2022-01-03 DIAGNOSIS — F819 Developmental disorder of scholastic skills, unspecified: Secondary | ICD-10-CM | POA: Diagnosis not present

## 2022-01-03 DIAGNOSIS — E079 Disorder of thyroid, unspecified: Secondary | ICD-10-CM | POA: Diagnosis not present

## 2022-01-04 DIAGNOSIS — J45909 Unspecified asthma, uncomplicated: Secondary | ICD-10-CM | POA: Diagnosis not present

## 2022-01-08 ENCOUNTER — Ambulatory Visit: Payer: Medicaid Other | Admitting: Podiatry

## 2022-01-08 DIAGNOSIS — J45909 Unspecified asthma, uncomplicated: Secondary | ICD-10-CM | POA: Diagnosis not present

## 2022-01-10 DIAGNOSIS — F259 Schizoaffective disorder, unspecified: Secondary | ICD-10-CM | POA: Diagnosis not present

## 2022-01-10 DIAGNOSIS — F819 Developmental disorder of scholastic skills, unspecified: Secondary | ICD-10-CM | POA: Diagnosis not present

## 2022-01-10 DIAGNOSIS — E079 Disorder of thyroid, unspecified: Secondary | ICD-10-CM | POA: Diagnosis not present

## 2022-01-10 DIAGNOSIS — K219 Gastro-esophageal reflux disease without esophagitis: Secondary | ICD-10-CM | POA: Diagnosis not present

## 2022-01-10 DIAGNOSIS — J45909 Unspecified asthma, uncomplicated: Secondary | ICD-10-CM | POA: Diagnosis not present

## 2022-01-10 DIAGNOSIS — G40909 Epilepsy, unspecified, not intractable, without status epilepticus: Secondary | ICD-10-CM | POA: Diagnosis not present

## 2022-01-10 DIAGNOSIS — Z7951 Long term (current) use of inhaled steroids: Secondary | ICD-10-CM | POA: Diagnosis not present

## 2022-01-10 DIAGNOSIS — M797 Fibromyalgia: Secondary | ICD-10-CM | POA: Diagnosis not present

## 2022-01-10 DIAGNOSIS — Z9181 History of falling: Secondary | ICD-10-CM | POA: Diagnosis not present

## 2022-01-10 DIAGNOSIS — Z944 Liver transplant status: Secondary | ICD-10-CM | POA: Diagnosis not present

## 2022-01-11 DIAGNOSIS — J45909 Unspecified asthma, uncomplicated: Secondary | ICD-10-CM | POA: Diagnosis not present

## 2022-01-16 ENCOUNTER — Ambulatory Visit: Payer: Medicaid Other | Admitting: Allergy and Immunology

## 2022-01-17 DIAGNOSIS — Z944 Liver transplant status: Secondary | ICD-10-CM | POA: Diagnosis not present

## 2022-01-17 DIAGNOSIS — G40909 Epilepsy, unspecified, not intractable, without status epilepticus: Secondary | ICD-10-CM | POA: Diagnosis not present

## 2022-01-17 DIAGNOSIS — F819 Developmental disorder of scholastic skills, unspecified: Secondary | ICD-10-CM | POA: Diagnosis not present

## 2022-01-17 DIAGNOSIS — Z7951 Long term (current) use of inhaled steroids: Secondary | ICD-10-CM | POA: Diagnosis not present

## 2022-01-17 DIAGNOSIS — Z9181 History of falling: Secondary | ICD-10-CM | POA: Diagnosis not present

## 2022-01-17 DIAGNOSIS — F259 Schizoaffective disorder, unspecified: Secondary | ICD-10-CM | POA: Diagnosis not present

## 2022-01-17 DIAGNOSIS — J45909 Unspecified asthma, uncomplicated: Secondary | ICD-10-CM | POA: Diagnosis not present

## 2022-01-17 DIAGNOSIS — E079 Disorder of thyroid, unspecified: Secondary | ICD-10-CM | POA: Diagnosis not present

## 2022-01-17 DIAGNOSIS — M797 Fibromyalgia: Secondary | ICD-10-CM | POA: Diagnosis not present

## 2022-01-17 DIAGNOSIS — K219 Gastro-esophageal reflux disease without esophagitis: Secondary | ICD-10-CM | POA: Diagnosis not present

## 2022-01-20 ENCOUNTER — Other Ambulatory Visit: Payer: Self-pay | Admitting: Student

## 2022-01-20 ENCOUNTER — Telehealth: Payer: Self-pay | Admitting: *Deleted

## 2022-01-20 DIAGNOSIS — L309 Dermatitis, unspecified: Secondary | ICD-10-CM

## 2022-01-20 NOTE — Telephone Encounter (Signed)
Returned patient's call about needing a referral for dermatology.  She was sent to a provider in [place] but is unable to travel there.  Will ask provider to place a new referral for skin monitoring due to medication she takes for her liver transplant.  Elda Dunkerson,CMA

## 2022-01-23 DIAGNOSIS — J45909 Unspecified asthma, uncomplicated: Secondary | ICD-10-CM | POA: Diagnosis not present

## 2022-01-24 ENCOUNTER — Other Ambulatory Visit: Payer: Self-pay | Admitting: Allergy and Immunology

## 2022-01-24 ENCOUNTER — Other Ambulatory Visit: Payer: Self-pay | Admitting: Family Medicine

## 2022-01-24 DIAGNOSIS — J45909 Unspecified asthma, uncomplicated: Secondary | ICD-10-CM | POA: Diagnosis not present

## 2022-01-24 DIAGNOSIS — K219 Gastro-esophageal reflux disease without esophagitis: Secondary | ICD-10-CM | POA: Diagnosis not present

## 2022-01-24 DIAGNOSIS — M797 Fibromyalgia: Secondary | ICD-10-CM | POA: Diagnosis not present

## 2022-01-24 DIAGNOSIS — G40909 Epilepsy, unspecified, not intractable, without status epilepticus: Secondary | ICD-10-CM | POA: Diagnosis not present

## 2022-01-24 DIAGNOSIS — Z7951 Long term (current) use of inhaled steroids: Secondary | ICD-10-CM | POA: Diagnosis not present

## 2022-01-24 DIAGNOSIS — E079 Disorder of thyroid, unspecified: Secondary | ICD-10-CM | POA: Diagnosis not present

## 2022-01-24 DIAGNOSIS — F819 Developmental disorder of scholastic skills, unspecified: Secondary | ICD-10-CM | POA: Diagnosis not present

## 2022-01-24 DIAGNOSIS — F259 Schizoaffective disorder, unspecified: Secondary | ICD-10-CM | POA: Diagnosis not present

## 2022-01-24 DIAGNOSIS — Z9181 History of falling: Secondary | ICD-10-CM | POA: Diagnosis not present

## 2022-01-24 DIAGNOSIS — Z944 Liver transplant status: Secondary | ICD-10-CM | POA: Diagnosis not present

## 2022-01-24 DIAGNOSIS — E038 Other specified hypothyroidism: Secondary | ICD-10-CM

## 2022-01-25 DIAGNOSIS — J45909 Unspecified asthma, uncomplicated: Secondary | ICD-10-CM | POA: Diagnosis not present

## 2022-01-26 DIAGNOSIS — J45909 Unspecified asthma, uncomplicated: Secondary | ICD-10-CM | POA: Diagnosis not present

## 2022-01-27 DIAGNOSIS — J45909 Unspecified asthma, uncomplicated: Secondary | ICD-10-CM | POA: Diagnosis not present

## 2022-01-28 DIAGNOSIS — J45909 Unspecified asthma, uncomplicated: Secondary | ICD-10-CM | POA: Diagnosis not present

## 2022-01-29 DIAGNOSIS — J45909 Unspecified asthma, uncomplicated: Secondary | ICD-10-CM | POA: Diagnosis not present

## 2022-01-30 DIAGNOSIS — J45909 Unspecified asthma, uncomplicated: Secondary | ICD-10-CM | POA: Diagnosis not present

## 2022-01-31 DIAGNOSIS — J45909 Unspecified asthma, uncomplicated: Secondary | ICD-10-CM | POA: Diagnosis not present

## 2022-01-31 DIAGNOSIS — F259 Schizoaffective disorder, unspecified: Secondary | ICD-10-CM | POA: Diagnosis not present

## 2022-02-01 ENCOUNTER — Other Ambulatory Visit: Payer: Self-pay | Admitting: *Deleted

## 2022-02-01 DIAGNOSIS — J45909 Unspecified asthma, uncomplicated: Secondary | ICD-10-CM | POA: Diagnosis not present

## 2022-02-01 MED ORDER — MONTELUKAST SODIUM 10 MG PO TABS: 10.0000 mg | ORAL_TABLET | Freq: Every evening | ORAL | 1 refills | Status: DC

## 2022-02-02 DIAGNOSIS — J45909 Unspecified asthma, uncomplicated: Secondary | ICD-10-CM | POA: Diagnosis not present

## 2022-02-03 DIAGNOSIS — J45909 Unspecified asthma, uncomplicated: Secondary | ICD-10-CM | POA: Diagnosis not present

## 2022-02-04 DIAGNOSIS — J45909 Unspecified asthma, uncomplicated: Secondary | ICD-10-CM | POA: Diagnosis not present

## 2022-02-05 DIAGNOSIS — H52203 Unspecified astigmatism, bilateral: Secondary | ICD-10-CM | POA: Diagnosis not present

## 2022-02-06 ENCOUNTER — Ambulatory Visit: Payer: Medicaid Other | Admitting: Podiatry

## 2022-02-06 DIAGNOSIS — J45909 Unspecified asthma, uncomplicated: Secondary | ICD-10-CM | POA: Diagnosis not present

## 2022-02-07 DIAGNOSIS — F259 Schizoaffective disorder, unspecified: Secondary | ICD-10-CM | POA: Diagnosis not present

## 2022-02-08 DIAGNOSIS — J45909 Unspecified asthma, uncomplicated: Secondary | ICD-10-CM | POA: Diagnosis not present

## 2022-02-08 DIAGNOSIS — Z7689 Persons encountering health services in other specified circumstances: Secondary | ICD-10-CM | POA: Diagnosis not present

## 2022-02-09 DIAGNOSIS — J45909 Unspecified asthma, uncomplicated: Secondary | ICD-10-CM | POA: Diagnosis not present

## 2022-02-10 DIAGNOSIS — J45909 Unspecified asthma, uncomplicated: Secondary | ICD-10-CM | POA: Diagnosis not present

## 2022-02-11 DIAGNOSIS — J45909 Unspecified asthma, uncomplicated: Secondary | ICD-10-CM | POA: Diagnosis not present

## 2022-02-12 DIAGNOSIS — J45909 Unspecified asthma, uncomplicated: Secondary | ICD-10-CM | POA: Diagnosis not present

## 2022-02-13 DIAGNOSIS — J45909 Unspecified asthma, uncomplicated: Secondary | ICD-10-CM | POA: Diagnosis not present

## 2022-02-14 DIAGNOSIS — F259 Schizoaffective disorder, unspecified: Secondary | ICD-10-CM | POA: Diagnosis not present

## 2022-02-14 DIAGNOSIS — J45909 Unspecified asthma, uncomplicated: Secondary | ICD-10-CM | POA: Diagnosis not present

## 2022-02-15 ENCOUNTER — Encounter: Payer: Self-pay | Admitting: Family Medicine

## 2022-02-15 ENCOUNTER — Ambulatory Visit (INDEPENDENT_AMBULATORY_CARE_PROVIDER_SITE_OTHER): Payer: Medicaid Other | Admitting: Family Medicine

## 2022-02-15 VITALS — Ht 59.0 in | Wt 228.2 lb

## 2022-02-15 DIAGNOSIS — Z6841 Body Mass Index (BMI) 40.0 and over, adult: Secondary | ICD-10-CM | POA: Diagnosis not present

## 2022-02-15 DIAGNOSIS — Z7689 Persons encountering health services in other specified circumstances: Secondary | ICD-10-CM | POA: Diagnosis not present

## 2022-02-15 DIAGNOSIS — Z944 Liver transplant status: Secondary | ICD-10-CM

## 2022-02-15 DIAGNOSIS — J45909 Unspecified asthma, uncomplicated: Secondary | ICD-10-CM | POA: Diagnosis not present

## 2022-02-15 NOTE — Progress Notes (Signed)
Relevant history/background: History of PBC s/p liver transplant. Hypothyroidism. Intellectual disability.   Assessment:   Follows with [name] dietitian who advised her to eat a 1200 calorie diet. She also provided patient with a list of 100 calorie snacks to eat as well as fast food meals that are low calories, at the same time as giving her recommendations for a low sodium diet. Was told not to drink tap water due to her liver. Recommended not eating too much sausage and chicken nuggets.   Patient states she has a new aid, old aid was fired because she wouldn't show up, and wouldn't clean up as much   Reviewed SMART goals with patient. Patient thinks she has been following them, but she has not been documenting.  Include vegetables for both lunch and dinner.   For each meal make sure you have some protein food  Limit sugary drinks- Limit tea to 3 times a week, limit coffee to 1 cup a day   Usual physical activity: Not documented today.  24-hr recall  (Up at 4 AM) B ( 9 AM)- 1 box cereal (fruit loops) without milk, 1 breaded chicken sandwich with 1/2 tsp mayonnaise. 1/2 c Mango pineapple juice   Snk ( AM)- None  L ( 10-11 AM)- 2-3c Alfredo with chicken, shrimp, tomatoes, broccoli. No drink Snk ( PM)- 1 c fat free yoplait blueberry yogurt. No drink  D ( 4 PM)- 1-2c Alfredo with chicken, shrimp, tomatoes, broccoli. No drink  Snk ( PM)- None   Typical day? Yes.     Intervention: Completed diet and exercise history, and established: - Today's SMART goals:  Include vegetables for both lunch and dinner.  This includes NON-breaded, non-fried vegetables, which can be either fresh or frozen.   For each meal make sure you have some protein food. It is best to cook your own protein by baking or grilling your meat. Minimize processed meat such as [place] bacon, deli meat, etc. A good portion size of protein food is:  - 4-8 oz of [place] yogurt  - 1 full cup of milk  - 2 eggs - 2 oz cheese  - Meat,  fish or poultry the size of deck of cards - One full cup of beans - minimize use of processed meats and other high sodium foods  3.   Drink a minimum of 48 ounces of water a day. At least 3 large bottles, 6 small bottles of water   Limit sugar, fat, and sodium in your diet. Try to avoid a lot of highly processed foods. These are foods that have ingredients you don't recognize and you cant duplicate in your own kitchen.   -Documenting progress on goals with goals sheet provided   For recommendations and goals, see Patient Instructions.    Follow-up: 7 weeks in Nutrition Clinic.   Dr. Jenne Campus will call if October appointment available   Shary Key

## 2022-02-15 NOTE — Patient Instructions (Addendum)
It was great seeing you today!  - Today's SMART goals:  Include vegetables for both lunch and dinner.  This includes NON-breaded, non-fried vegetables, which can be either fresh or frozen.   For each meal make sure you have some protein food. It is best to cook your own protein by baking or grilling your meat. Minimize processed meat such as [place] bacon, deli meat, etc. A good portion size of protein food is:  - 4-8 oz of [place] yogurt  - 1 full cup of milk  - 2 eggs - 2 oz cheese  - Meat, fish or poultry the size of deck of cards - One full cup of beans  3.   Increase water intake- Minimum of 48 ounces of water a day. At least 3 large bottles, 6 small bottles of water   Limit sugar, fat, and sodium in your diet. Try to avoid a lot of highly processed foods. These are foods that have ingredients you don't recognize and you cant duplicate in your own kitchen.   Please check if your aid can come to this nutrition visit next time. If your aid can not come to the appointment please have her call Dr. Jenne Campus: [phone].  Dr. Jenne Campus email if you have questions: [email or web address]  Feel free to call with any questions or concerns   Take care,  Dr. Shary Key [facility]

## 2022-02-16 DIAGNOSIS — J45909 Unspecified asthma, uncomplicated: Secondary | ICD-10-CM | POA: Diagnosis not present

## 2022-02-18 DIAGNOSIS — J45909 Unspecified asthma, uncomplicated: Secondary | ICD-10-CM | POA: Diagnosis not present

## 2022-02-20 DIAGNOSIS — J45909 Unspecified asthma, uncomplicated: Secondary | ICD-10-CM | POA: Diagnosis not present

## 2022-02-21 DIAGNOSIS — Z944 Liver transplant status: Secondary | ICD-10-CM | POA: Diagnosis not present

## 2022-02-21 DIAGNOSIS — E079 Disorder of thyroid, unspecified: Secondary | ICD-10-CM | POA: Diagnosis not present

## 2022-02-21 DIAGNOSIS — F259 Schizoaffective disorder, unspecified: Secondary | ICD-10-CM | POA: Diagnosis not present

## 2022-02-21 DIAGNOSIS — F819 Developmental disorder of scholastic skills, unspecified: Secondary | ICD-10-CM | POA: Diagnosis not present

## 2022-02-21 DIAGNOSIS — Z9181 History of falling: Secondary | ICD-10-CM | POA: Diagnosis not present

## 2022-02-21 DIAGNOSIS — J45909 Unspecified asthma, uncomplicated: Secondary | ICD-10-CM | POA: Diagnosis not present

## 2022-02-21 DIAGNOSIS — K219 Gastro-esophageal reflux disease without esophagitis: Secondary | ICD-10-CM | POA: Diagnosis not present

## 2022-02-21 DIAGNOSIS — G40909 Epilepsy, unspecified, not intractable, without status epilepticus: Secondary | ICD-10-CM | POA: Diagnosis not present

## 2022-02-21 DIAGNOSIS — Z7951 Long term (current) use of inhaled steroids: Secondary | ICD-10-CM | POA: Diagnosis not present

## 2022-02-21 DIAGNOSIS — M797 Fibromyalgia: Secondary | ICD-10-CM | POA: Diagnosis not present

## 2022-02-22 DIAGNOSIS — J45909 Unspecified asthma, uncomplicated: Secondary | ICD-10-CM | POA: Diagnosis not present

## 2022-02-23 DIAGNOSIS — J45909 Unspecified asthma, uncomplicated: Secondary | ICD-10-CM | POA: Diagnosis not present

## 2022-02-24 DIAGNOSIS — J45909 Unspecified asthma, uncomplicated: Secondary | ICD-10-CM | POA: Diagnosis not present

## 2022-02-25 DIAGNOSIS — J45909 Unspecified asthma, uncomplicated: Secondary | ICD-10-CM | POA: Diagnosis not present

## 2022-02-26 DIAGNOSIS — J45909 Unspecified asthma, uncomplicated: Secondary | ICD-10-CM | POA: Diagnosis not present

## 2022-02-27 DIAGNOSIS — J45909 Unspecified asthma, uncomplicated: Secondary | ICD-10-CM | POA: Diagnosis not present

## 2022-02-28 DIAGNOSIS — J45909 Unspecified asthma, uncomplicated: Secondary | ICD-10-CM | POA: Diagnosis not present

## 2022-02-28 DIAGNOSIS — Z79899 Other long term (current) drug therapy: Secondary | ICD-10-CM | POA: Diagnosis not present

## 2022-02-28 DIAGNOSIS — Z7951 Long term (current) use of inhaled steroids: Secondary | ICD-10-CM | POA: Diagnosis not present

## 2022-02-28 DIAGNOSIS — Z944 Liver transplant status: Secondary | ICD-10-CM | POA: Diagnosis not present

## 2022-02-28 DIAGNOSIS — D849 Immunodeficiency, unspecified: Secondary | ICD-10-CM | POA: Diagnosis not present

## 2022-02-28 DIAGNOSIS — F259 Schizoaffective disorder, unspecified: Secondary | ICD-10-CM | POA: Diagnosis not present

## 2022-03-01 DIAGNOSIS — J45909 Unspecified asthma, uncomplicated: Secondary | ICD-10-CM | POA: Diagnosis not present

## 2022-03-02 DIAGNOSIS — J45909 Unspecified asthma, uncomplicated: Secondary | ICD-10-CM | POA: Diagnosis not present

## 2022-03-04 DIAGNOSIS — J45909 Unspecified asthma, uncomplicated: Secondary | ICD-10-CM | POA: Diagnosis not present

## 2022-03-05 DIAGNOSIS — J45909 Unspecified asthma, uncomplicated: Secondary | ICD-10-CM | POA: Diagnosis not present

## 2022-03-06 ENCOUNTER — Ambulatory Visit: Payer: Medicaid Other | Admitting: Podiatry

## 2022-03-06 DIAGNOSIS — J45909 Unspecified asthma, uncomplicated: Secondary | ICD-10-CM | POA: Diagnosis not present

## 2022-03-07 DIAGNOSIS — Z79899 Other long term (current) drug therapy: Secondary | ICD-10-CM | POA: Diagnosis not present

## 2022-03-07 DIAGNOSIS — J45909 Unspecified asthma, uncomplicated: Secondary | ICD-10-CM | POA: Diagnosis not present

## 2022-03-07 DIAGNOSIS — Z7951 Long term (current) use of inhaled steroids: Secondary | ICD-10-CM | POA: Diagnosis not present

## 2022-03-07 DIAGNOSIS — Z944 Liver transplant status: Secondary | ICD-10-CM | POA: Diagnosis not present

## 2022-03-07 DIAGNOSIS — F259 Schizoaffective disorder, unspecified: Secondary | ICD-10-CM | POA: Diagnosis not present

## 2022-03-08 DIAGNOSIS — J45909 Unspecified asthma, uncomplicated: Secondary | ICD-10-CM | POA: Diagnosis not present

## 2022-03-08 NOTE — Progress Notes (Signed)
  SUBJECTIVE:   CHIEF COMPLAINT / HPI:   Urine concerns: Fruity smelling urine that is sometimes red/orange which started in August. Denies pain with urination, endorses more frequent urination. Not consistent, these symptoms happen "sometimes". She takes vitamins but denies any further OTC medications/supplements. Denies eating anything new such as beets. Lives by herself but has her uncle next door. States she has had a bladder infection in the past.   PERTINENT  PMH / PSH: Schizophrenia, paranoid type, intellectual disability, seizure disorder, GERD, anemia, PBC with history of liver transplant  OBJECTIVE:  BP (!) 152/78   Pulse 80   Ht '4\' 11"'$  (1.499 m)   Wt 229 lb 3.2 oz (104 kg)   LMP 04/14/2021   SpO2 100%   BMI 46.29 kg/m  Physical Exam Constitutional:      Appearance: Normal appearance.  Cardiovascular:     Rate and Rhythm: Normal rate and regular rhythm.     Heart sounds: Normal heart sounds.  Pulmonary:     Effort: Pulmonary effort is normal.     Breath sounds: Normal breath sounds.  Abdominal:     General: Bowel sounds are normal.     Palpations: Abdomen is soft.     Tenderness: There is no abdominal tenderness.  Neurological:     Mental Status: She is alert.    ASSESSMENT/PLAN:  Orange-colored urine Assessment & Plan: Subjective, intermitten, ruled out diet/medication-induced reasons.  Unlikely to be related to porphyria given her age. Recent labs by [name] show normal bilirubin and liver function in combination with normal physical exam, unlikely to be related to liver transplant or PBC. Differential further includes UTI, diabetes, hematuria and patient is at increased risk for cancer with immunosuppressive medications.  Orders: -     Urinalysis  Sweet urine odor -     POCT glycosylated hemoglobin (Hb A1C)  Need for immunization against influenza -     Flu Vaccine QUAD 28moIM (Fluarix, Fluzone & Alfiuria Quad PF)  Return if symptoms worsen or fail to  improve. AWells Guiles DO 03/09/2022, 2:33 PM PGY-2, [place]

## 2022-03-09 ENCOUNTER — Ambulatory Visit (INDEPENDENT_AMBULATORY_CARE_PROVIDER_SITE_OTHER): Payer: Medicaid Other | Admitting: Student

## 2022-03-09 ENCOUNTER — Encounter: Payer: Self-pay | Admitting: Student

## 2022-03-09 VITALS — BP 152/78 | HR 80 | Ht 59.0 in | Wt 229.2 lb

## 2022-03-09 DIAGNOSIS — R82998 Other abnormal findings in urine: Secondary | ICD-10-CM | POA: Insufficient documentation

## 2022-03-09 DIAGNOSIS — Z23 Encounter for immunization: Secondary | ICD-10-CM

## 2022-03-09 DIAGNOSIS — N390 Urinary tract infection, site not specified: Secondary | ICD-10-CM | POA: Diagnosis not present

## 2022-03-09 DIAGNOSIS — Z7689 Persons encountering health services in other specified circumstances: Secondary | ICD-10-CM | POA: Diagnosis not present

## 2022-03-09 LAB — POCT GLYCOSYLATED HEMOGLOBIN (HGB A1C): Hemoglobin A1C: 5.4 % (ref 4.0–5.6)

## 2022-03-09 NOTE — Patient Instructions (Addendum)
It was great to see you today! Thank you for choosing [facility] for your primary care. [name] was seen for red/orange urine.  Today we addressed: Your A1c was normal.  We are also testing your urine and I will follow-up with you regarding this.  If you haven't already, sign up for My Chart to have easy access to your labs results, and communication with your primary care physician.  We are checking some labs today. If they are abnormal, I will call you. If they are normal, I will send you a MyChart message (if it is active) or a letter in the mail. If you do not hear about your labs in the next 2 weeks, please call the office. Call the clinic at [phone] if your symptoms worsen or you have any concerns.  You should return to our clinic Return if symptoms worsen or fail to improve. Please arrive 15 minutes before your appointment to ensure smooth check in process.  We appreciate your efforts in making this happen.  Thank you for allowing me to participate in your care, Wells Guiles, DO 03/09/2022, 2:23 PM PGY-2, [facility] red/orange

## 2022-03-09 NOTE — Assessment & Plan Note (Addendum)
Subjective, intermitten, ruled out diet/medication-induced reasons.  Unlikely to be related to porphyria given her age. Recent labs by [name] show normal bilirubin and liver function in combination with normal physical exam, unlikely to be related to liver transplant or PBC. Differential further includes UTI, diabetes, hematuria and patient is at increased risk for cancer with immunosuppressive medications.

## 2022-03-10 LAB — URINALYSIS
Bilirubin, UA: NEGATIVE
Glucose, UA: NEGATIVE
Ketones, UA: NEGATIVE
Leukocytes,UA: NEGATIVE
Nitrite, UA: NEGATIVE
Protein,UA: NEGATIVE
RBC, UA: NEGATIVE
Specific Gravity, UA: 1.012 (ref 1.005–1.030)
Urobilinogen, Ur: 0.2 mg/dL (ref 0.2–1.0)
pH, UA: 7 (ref 5.0–7.5)

## 2022-03-12 DIAGNOSIS — L853 Xerosis cutis: Secondary | ICD-10-CM | POA: Diagnosis not present

## 2022-03-12 DIAGNOSIS — Z7689 Persons encountering health services in other specified circumstances: Secondary | ICD-10-CM | POA: Diagnosis not present

## 2022-03-12 DIAGNOSIS — Z1283 Encounter for screening for malignant neoplasm of skin: Secondary | ICD-10-CM | POA: Diagnosis not present

## 2022-03-12 DIAGNOSIS — J45909 Unspecified asthma, uncomplicated: Secondary | ICD-10-CM | POA: Diagnosis not present

## 2022-03-13 DIAGNOSIS — J45909 Unspecified asthma, uncomplicated: Secondary | ICD-10-CM | POA: Diagnosis not present

## 2022-03-14 DIAGNOSIS — Z79899 Other long term (current) drug therapy: Secondary | ICD-10-CM | POA: Diagnosis not present

## 2022-03-14 DIAGNOSIS — F259 Schizoaffective disorder, unspecified: Secondary | ICD-10-CM | POA: Diagnosis not present

## 2022-03-14 DIAGNOSIS — Z944 Liver transplant status: Secondary | ICD-10-CM | POA: Diagnosis not present

## 2022-03-14 DIAGNOSIS — J45909 Unspecified asthma, uncomplicated: Secondary | ICD-10-CM | POA: Diagnosis not present

## 2022-03-14 DIAGNOSIS — Z7951 Long term (current) use of inhaled steroids: Secondary | ICD-10-CM | POA: Diagnosis not present

## 2022-03-15 DIAGNOSIS — J45909 Unspecified asthma, uncomplicated: Secondary | ICD-10-CM | POA: Diagnosis not present

## 2022-03-16 DIAGNOSIS — J45909 Unspecified asthma, uncomplicated: Secondary | ICD-10-CM | POA: Diagnosis not present

## 2022-03-19 DIAGNOSIS — J45909 Unspecified asthma, uncomplicated: Secondary | ICD-10-CM | POA: Diagnosis not present

## 2022-03-20 DIAGNOSIS — J45909 Unspecified asthma, uncomplicated: Secondary | ICD-10-CM | POA: Diagnosis not present

## 2022-03-21 DIAGNOSIS — Z944 Liver transplant status: Secondary | ICD-10-CM | POA: Diagnosis not present

## 2022-03-21 DIAGNOSIS — Z7951 Long term (current) use of inhaled steroids: Secondary | ICD-10-CM | POA: Diagnosis not present

## 2022-03-21 DIAGNOSIS — F259 Schizoaffective disorder, unspecified: Secondary | ICD-10-CM | POA: Diagnosis not present

## 2022-03-21 DIAGNOSIS — Z79899 Other long term (current) drug therapy: Secondary | ICD-10-CM | POA: Diagnosis not present

## 2022-03-22 DIAGNOSIS — J45909 Unspecified asthma, uncomplicated: Secondary | ICD-10-CM | POA: Diagnosis not present

## 2022-03-23 DIAGNOSIS — J45909 Unspecified asthma, uncomplicated: Secondary | ICD-10-CM | POA: Diagnosis not present

## 2022-03-24 DIAGNOSIS — J45909 Unspecified asthma, uncomplicated: Secondary | ICD-10-CM | POA: Diagnosis not present

## 2022-03-25 DIAGNOSIS — J45909 Unspecified asthma, uncomplicated: Secondary | ICD-10-CM | POA: Diagnosis not present

## 2022-03-26 ENCOUNTER — Ambulatory Visit: Payer: Medicaid Other | Admitting: Podiatry

## 2022-03-26 ENCOUNTER — Encounter: Payer: Self-pay | Admitting: Podiatry

## 2022-03-26 DIAGNOSIS — J45909 Unspecified asthma, uncomplicated: Secondary | ICD-10-CM | POA: Diagnosis not present

## 2022-03-26 DIAGNOSIS — D696 Thrombocytopenia, unspecified: Secondary | ICD-10-CM

## 2022-03-26 DIAGNOSIS — B351 Tinea unguium: Secondary | ICD-10-CM | POA: Diagnosis not present

## 2022-03-26 DIAGNOSIS — M79674 Pain in right toe(s): Secondary | ICD-10-CM

## 2022-03-26 DIAGNOSIS — Z7689 Persons encountering health services in other specified circumstances: Secondary | ICD-10-CM | POA: Diagnosis not present

## 2022-03-26 DIAGNOSIS — M79675 Pain in left toe(s): Secondary | ICD-10-CM | POA: Diagnosis not present

## 2022-03-26 NOTE — Progress Notes (Signed)
This patient returns to the office for evaluation and treatment of long thick painful nails .  This patient is unable to trim her own nails since the patient cannot reach her feet.  Patient says the nails are painful walking and wearing her shoes.  She returns for preventive foot care services.  General Appearance  Alert, conversant and in no acute stress.  Vascular  Dorsalis pedis and posterior tibial  pulses are palpable  bilaterally.  Capillary return is within normal limits  bilaterally. Temperature is within normal limits  bilaterally.  Neurologic  Senn-Weinstein monofilament wire test within normal limits  bilaterally. Muscle power within normal limits bilaterally.  Nails Thick disfigured discolored nails with subungual debris  from hallux to fifth toes bilaterally. No evidence of bacterial infection or drainage bilaterally.  Orthopedic  No limitations of motion  feet .  No crepitus or effusions noted.  No bony pathology or digital deformities noted.  Skin  normotropic skin with no porokeratosis noted bilaterally.  No signs of infections or ulcers noted.     Onychomycosis  Pain in toes right foot  Pain in toes left foot  Debridement  of nails  1-5  B/L with a nail nipper.  Nails were then filed using a dremel tool with no incidents.    RTC  3 months   Jenipher Havel DPM   

## 2022-03-28 DIAGNOSIS — Z79899 Other long term (current) drug therapy: Secondary | ICD-10-CM | POA: Diagnosis not present

## 2022-03-28 DIAGNOSIS — Z944 Liver transplant status: Secondary | ICD-10-CM | POA: Diagnosis not present

## 2022-03-28 DIAGNOSIS — F259 Schizoaffective disorder, unspecified: Secondary | ICD-10-CM | POA: Diagnosis not present

## 2022-03-28 DIAGNOSIS — Z7951 Long term (current) use of inhaled steroids: Secondary | ICD-10-CM | POA: Diagnosis not present

## 2022-03-29 ENCOUNTER — Ambulatory Visit: Payer: Medicaid Other | Admitting: Family Medicine

## 2022-03-29 DIAGNOSIS — J45909 Unspecified asthma, uncomplicated: Secondary | ICD-10-CM | POA: Diagnosis not present

## 2022-03-30 DIAGNOSIS — J45909 Unspecified asthma, uncomplicated: Secondary | ICD-10-CM | POA: Diagnosis not present

## 2022-04-01 DIAGNOSIS — J45909 Unspecified asthma, uncomplicated: Secondary | ICD-10-CM | POA: Diagnosis not present

## 2022-04-02 DIAGNOSIS — J45909 Unspecified asthma, uncomplicated: Secondary | ICD-10-CM | POA: Diagnosis not present

## 2022-04-03 ENCOUNTER — Ambulatory Visit: Payer: Medicaid Other | Admitting: Allergy and Immunology

## 2022-04-03 DIAGNOSIS — J45909 Unspecified asthma, uncomplicated: Secondary | ICD-10-CM | POA: Diagnosis not present

## 2022-04-04 DIAGNOSIS — Z7951 Long term (current) use of inhaled steroids: Secondary | ICD-10-CM | POA: Diagnosis not present

## 2022-04-04 DIAGNOSIS — Z944 Liver transplant status: Secondary | ICD-10-CM | POA: Diagnosis not present

## 2022-04-04 DIAGNOSIS — J45909 Unspecified asthma, uncomplicated: Secondary | ICD-10-CM | POA: Diagnosis not present

## 2022-04-04 DIAGNOSIS — F259 Schizoaffective disorder, unspecified: Secondary | ICD-10-CM | POA: Diagnosis not present

## 2022-04-04 DIAGNOSIS — Z79899 Other long term (current) drug therapy: Secondary | ICD-10-CM | POA: Diagnosis not present

## 2022-04-05 ENCOUNTER — Ambulatory Visit: Payer: Medicaid Other

## 2022-04-07 DIAGNOSIS — J45909 Unspecified asthma, uncomplicated: Secondary | ICD-10-CM | POA: Diagnosis not present

## 2022-04-08 DIAGNOSIS — J45909 Unspecified asthma, uncomplicated: Secondary | ICD-10-CM | POA: Diagnosis not present

## 2022-04-09 DIAGNOSIS — J45909 Unspecified asthma, uncomplicated: Secondary | ICD-10-CM | POA: Diagnosis not present

## 2022-04-10 DIAGNOSIS — Z944 Liver transplant status: Secondary | ICD-10-CM | POA: Diagnosis not present

## 2022-04-10 DIAGNOSIS — J45909 Unspecified asthma, uncomplicated: Secondary | ICD-10-CM | POA: Diagnosis not present

## 2022-04-10 DIAGNOSIS — Z6841 Body Mass Index (BMI) 40.0 and over, adult: Secondary | ICD-10-CM | POA: Diagnosis not present

## 2022-04-10 DIAGNOSIS — R7989 Other specified abnormal findings of blood chemistry: Secondary | ICD-10-CM | POA: Diagnosis not present

## 2022-04-10 DIAGNOSIS — Z79899 Other long term (current) drug therapy: Secondary | ICD-10-CM | POA: Diagnosis not present

## 2022-04-10 DIAGNOSIS — D849 Immunodeficiency, unspecified: Secondary | ICD-10-CM | POA: Diagnosis not present

## 2022-04-10 DIAGNOSIS — D84821 Immunodeficiency due to drugs: Secondary | ICD-10-CM | POA: Diagnosis not present

## 2022-04-10 DIAGNOSIS — N1831 Chronic kidney disease, stage 3a: Secondary | ICD-10-CM | POA: Diagnosis not present

## 2022-04-10 DIAGNOSIS — I129 Hypertensive chronic kidney disease with stage 1 through stage 4 chronic kidney disease, or unspecified chronic kidney disease: Secondary | ICD-10-CM | POA: Diagnosis not present

## 2022-04-11 ENCOUNTER — Other Ambulatory Visit: Payer: Self-pay | Admitting: Family Medicine

## 2022-04-11 DIAGNOSIS — Z7951 Long term (current) use of inhaled steroids: Secondary | ICD-10-CM | POA: Diagnosis not present

## 2022-04-11 DIAGNOSIS — Z79899 Other long term (current) drug therapy: Secondary | ICD-10-CM | POA: Diagnosis not present

## 2022-04-11 DIAGNOSIS — J45909 Unspecified asthma, uncomplicated: Secondary | ICD-10-CM | POA: Diagnosis not present

## 2022-04-11 DIAGNOSIS — F259 Schizoaffective disorder, unspecified: Secondary | ICD-10-CM | POA: Diagnosis not present

## 2022-04-11 DIAGNOSIS — Z944 Liver transplant status: Secondary | ICD-10-CM | POA: Diagnosis not present

## 2022-04-11 DIAGNOSIS — Z1231 Encounter for screening mammogram for malignant neoplasm of breast: Secondary | ICD-10-CM

## 2022-04-12 DIAGNOSIS — J45909 Unspecified asthma, uncomplicated: Secondary | ICD-10-CM | POA: Diagnosis not present

## 2022-04-13 DIAGNOSIS — J45909 Unspecified asthma, uncomplicated: Secondary | ICD-10-CM | POA: Diagnosis not present

## 2022-04-14 DIAGNOSIS — J45909 Unspecified asthma, uncomplicated: Secondary | ICD-10-CM | POA: Diagnosis not present

## 2022-04-16 ENCOUNTER — Ambulatory Visit: Payer: Medicaid Other

## 2022-04-16 ENCOUNTER — Ambulatory Visit: Payer: Medicaid Other | Admitting: Student

## 2022-04-16 DIAGNOSIS — J45909 Unspecified asthma, uncomplicated: Secondary | ICD-10-CM | POA: Diagnosis not present

## 2022-04-16 NOTE — Progress Notes (Deleted)
    SUBJECTIVE:   CHIEF COMPLAINT / HPI:   ***  Colonoscopy?  PERTINENT  PMH / PSH: Asthma, GERD, hypothyroidism, seizures, schizophrenia  OBJECTIVE:   There were no vitals taken for this visit. ***  General: NAD, pleasant, able to participate in exam Cardiac: RRR, no murmurs. Respiratory: CTAB, normal effort, No wheezes, rales or rhonchi Abdomen: Bowel sounds present, nontender, nondistended, no hepatosplenomegaly. Extremities: no edema or cyanosis. Skin: warm and dry, no rashes noted Neuro: alert, no obvious focal deficits Psych: Normal affect and mood  ASSESSMENT/PLAN:   No problem-specific Assessment & Plan notes found for this encounter.     Dr. [name], [place]    {    This will disappear when note is signed, click to select method of visit    :1}

## 2022-04-17 DIAGNOSIS — J45909 Unspecified asthma, uncomplicated: Secondary | ICD-10-CM | POA: Diagnosis not present

## 2022-04-18 DIAGNOSIS — F259 Schizoaffective disorder, unspecified: Secondary | ICD-10-CM | POA: Diagnosis not present

## 2022-04-18 DIAGNOSIS — Z944 Liver transplant status: Secondary | ICD-10-CM | POA: Diagnosis not present

## 2022-04-18 DIAGNOSIS — Z79899 Other long term (current) drug therapy: Secondary | ICD-10-CM | POA: Diagnosis not present

## 2022-04-18 DIAGNOSIS — Z7951 Long term (current) use of inhaled steroids: Secondary | ICD-10-CM | POA: Diagnosis not present

## 2022-04-21 DIAGNOSIS — J45909 Unspecified asthma, uncomplicated: Secondary | ICD-10-CM | POA: Diagnosis not present

## 2022-04-22 DIAGNOSIS — J45909 Unspecified asthma, uncomplicated: Secondary | ICD-10-CM | POA: Diagnosis not present

## 2022-04-23 DIAGNOSIS — J45909 Unspecified asthma, uncomplicated: Secondary | ICD-10-CM | POA: Diagnosis not present

## 2022-04-24 DIAGNOSIS — J45909 Unspecified asthma, uncomplicated: Secondary | ICD-10-CM | POA: Diagnosis not present

## 2022-04-25 DIAGNOSIS — Z79899 Other long term (current) drug therapy: Secondary | ICD-10-CM | POA: Diagnosis not present

## 2022-04-25 DIAGNOSIS — J45909 Unspecified asthma, uncomplicated: Secondary | ICD-10-CM | POA: Diagnosis not present

## 2022-04-25 DIAGNOSIS — Z7951 Long term (current) use of inhaled steroids: Secondary | ICD-10-CM | POA: Diagnosis not present

## 2022-04-25 DIAGNOSIS — Z944 Liver transplant status: Secondary | ICD-10-CM | POA: Diagnosis not present

## 2022-04-25 DIAGNOSIS — F259 Schizoaffective disorder, unspecified: Secondary | ICD-10-CM | POA: Diagnosis not present

## 2022-04-26 DIAGNOSIS — J45909 Unspecified asthma, uncomplicated: Secondary | ICD-10-CM | POA: Diagnosis not present

## 2022-04-26 NOTE — Progress Notes (Deleted)
    SUBJECTIVE:   Chief compliant/HPI: annual examination  [name] is a 46 y.o. who presents today for an annual exam.   History tabs reviewed and updated ***.   Review of systems form reviewed and notable for ***.   OBJECTIVE:   There were no vitals taken for this visit.  ***  ASSESSMENT/PLAN:   No problem-specific Assessment & Plan notes found for this encounter.    Annual Examination  See AVS for age appropriate recommendations.   PHQ score ***, reviewed and discussed.  Blood pressure reviewed and at goal ***.  Asked about intimate partner violence and resources given as appropriate  The patient currently uses *** for contraception. Folate recommended as appropriate, minimum of 400 mcg per day.   Considered the following items based upon USPSTF recommendations: Diabetes screening:  Hemoglobin A1c of 5.6 in April 2023 Screening for elevated cholesterol:  Lipid panel done in April 2023 showing LDL 120 and total cholesterol of 207 HIV testing:  Previously negative Hepatitis C:  Previously negative Hepatitis B: {discussed/ordered:14545} Syphilis if at high risk: {discussed/ordered:14545} GC/CT {GC/CT screening :23818} Reviewed risk factors for latent tuberculosis and {not indicated/requested/declined:14582}   Discussed family history, BRCA testing {not indicated/requested/declined:14582}. Tool used to risk stratify was ***.  Cervical cancer screening:  Due in 11/2026 Breast cancer screening: {mammoscreen:23820} Colorectal cancer screening: {crcscreen:23821::"discussed, colonoscopy ordered"} if age 16 or over.   Follow up in 1 *** year or sooner if indicated.    [name], [place]

## 2022-04-27 ENCOUNTER — Ambulatory Visit: Payer: Medicaid Other | Admitting: Student

## 2022-04-27 DIAGNOSIS — J45909 Unspecified asthma, uncomplicated: Secondary | ICD-10-CM | POA: Diagnosis not present

## 2022-04-28 DIAGNOSIS — J45909 Unspecified asthma, uncomplicated: Secondary | ICD-10-CM | POA: Diagnosis not present

## 2022-04-29 DIAGNOSIS — J45909 Unspecified asthma, uncomplicated: Secondary | ICD-10-CM | POA: Diagnosis not present

## 2022-04-30 DIAGNOSIS — J45909 Unspecified asthma, uncomplicated: Secondary | ICD-10-CM | POA: Diagnosis not present

## 2022-05-01 DIAGNOSIS — J45909 Unspecified asthma, uncomplicated: Secondary | ICD-10-CM | POA: Diagnosis not present

## 2022-05-02 ENCOUNTER — Other Ambulatory Visit: Payer: Self-pay | Admitting: Student

## 2022-05-02 DIAGNOSIS — F259 Schizoaffective disorder, unspecified: Secondary | ICD-10-CM | POA: Diagnosis not present

## 2022-05-02 DIAGNOSIS — Z944 Liver transplant status: Secondary | ICD-10-CM | POA: Diagnosis not present

## 2022-05-02 DIAGNOSIS — Z79899 Other long term (current) drug therapy: Secondary | ICD-10-CM | POA: Diagnosis not present

## 2022-05-02 DIAGNOSIS — E038 Other specified hypothyroidism: Secondary | ICD-10-CM

## 2022-05-02 DIAGNOSIS — Z7951 Long term (current) use of inhaled steroids: Secondary | ICD-10-CM | POA: Diagnosis not present

## 2022-05-02 NOTE — Progress Notes (Unsigned)
    SUBJECTIVE:   CHIEF COMPLAINT / HPI:   Orange color urine Patient evaluated for H. pylori.  Next visit in October.  Urinalysis and A1c were normal.  Today patient states***  PERTINENT  PMH / PSH: Schizophrenia, intellectual disability, seizure disorder, GERD, anemia, PBC with history of liver transplant  OBJECTIVE:   There were no vitals taken for this visit. ***  General: NAD, pleasant, able to participate in exam Cardiac: RRR, no murmurs. Respiratory: CTAB, normal effort, No wheezes, rales or rhonchi Abdomen: Bowel sounds present, nontender, nondistended, no hepatosplenomegaly. Extremities: no edema or cyanosis. Skin: warm and dry, no rashes noted Neuro: alert, no obvious focal deficits Psych: Normal affect and mood  ASSESSMENT/PLAN:   No problem-specific Assessment & Plan notes found for this encounter.     Dr. [name], [place]    {    This will disappear when note is signed, click to select method of visit    :1}

## 2022-05-03 ENCOUNTER — Ambulatory Visit (INDEPENDENT_AMBULATORY_CARE_PROVIDER_SITE_OTHER): Payer: Medicaid Other | Admitting: Student

## 2022-05-03 ENCOUNTER — Ambulatory Visit (INDEPENDENT_AMBULATORY_CARE_PROVIDER_SITE_OTHER): Payer: Medicaid Other | Admitting: Family Medicine

## 2022-05-03 ENCOUNTER — Encounter: Payer: Self-pay | Admitting: Family Medicine

## 2022-05-03 ENCOUNTER — Encounter: Payer: Self-pay | Admitting: Student

## 2022-05-03 ENCOUNTER — Encounter: Payer: Self-pay | Admitting: *Deleted

## 2022-05-03 ENCOUNTER — Telehealth: Payer: Self-pay | Admitting: Family Medicine

## 2022-05-03 VITALS — Ht 59.0 in | Wt 230.8 lb

## 2022-05-03 VITALS — BP 124/80 | HR 106 | Temp 97.8°F | Ht 59.0 in | Wt 230.6 lb

## 2022-05-03 DIAGNOSIS — R7989 Other specified abnormal findings of blood chemistry: Secondary | ICD-10-CM

## 2022-05-03 DIAGNOSIS — D638 Anemia in other chronic diseases classified elsewhere: Secondary | ICD-10-CM | POA: Diagnosis not present

## 2022-05-03 DIAGNOSIS — Z23 Encounter for immunization: Secondary | ICD-10-CM | POA: Diagnosis not present

## 2022-05-03 DIAGNOSIS — E038 Other specified hypothyroidism: Secondary | ICD-10-CM

## 2022-05-03 DIAGNOSIS — Z944 Liver transplant status: Secondary | ICD-10-CM | POA: Diagnosis not present

## 2022-05-03 DIAGNOSIS — J45909 Unspecified asthma, uncomplicated: Secondary | ICD-10-CM

## 2022-05-03 DIAGNOSIS — N179 Acute kidney failure, unspecified: Secondary | ICD-10-CM

## 2022-05-03 HISTORY — DX: Acute kidney failure, unspecified: N17.9

## 2022-05-03 NOTE — Telephone Encounter (Signed)
Patient with transportation issues. Believes she will make it.

## 2022-05-03 NOTE — Patient Instructions (Addendum)
It was wonderful to see you today. Thank you for allowing me to be a part of your care. Below is a short summary of what we discussed at your visit today:  Nutrition Most important information from packet of info from dietician from [name]: Every day, eat 3 nutritionally balanced meals: breakfast, lunch, and dinner Breakfast: protein, carbohydrate. Fruit is optional.  Lunch and dinner: 2 Vegetables, 2 carbohydrates, 1 protein Use fats and oils in moderation   Limit how much you are frying foods Drink PLENTY of plain water through the day Limit juices or sweet drinks Choose fruits and vegetables and other WHOLE REAL foods Limit highly processed foods Highly processed foods = anything that comes in a plastic wrapper American cheese "Instant" foods Pre-made frozen meals Nuts: calories and good fats from nuts add up in a hurry! If you eat nuts, aim for no more than 1/4 cup of nuts at a time  [  ] Check the sodium content on the soy sauce. Check how much is in a serving size. Call Dr. Jenne Campus at [phone] and let her know what the label says.   Goals for the coming months:   1. Drink a minimum of 48 ounces of water a day. At least 3 large bottles, 6 small bottles of water in a day   2. Limit starchy foods to TWO portions per meal and ONE per snack. ONE portion of a starchy food is equal to the following:  - ONE slice of bread (or its equivalent, such as half of a hamburger bun).  - 1/2 cup of a "scoopable" starchy food such as potatoes or rice.  - 15 grams of Total Carbohydrate as shown on food label.  - 4 ounces of a sweet drink (including fruit juice).   3. Eat 6 servings vegetables daily, divided across meals.  This includes NON-breaded, non-fried vegetables, which can be either fresh or frozen.     If you have any questions or concerns, please do not hesitate to contact us via phone or MyChart message.   Ezequiel Essex, MD

## 2022-05-03 NOTE — Assessment & Plan Note (Signed)
Anemia likely d/t chronic disease in which case iron supplements won't help. Will check CBC and ferritin today. If hemoglobin stable from Oct and ferritin level indicates anemia of chronic disease, will discontinue iron supplements.

## 2022-05-03 NOTE — Assessment & Plan Note (Signed)
TSH normal in July. Pt wishes to check TSH today.  -Continue synthroid 75 MCG daily

## 2022-05-03 NOTE — Progress Notes (Signed)
What do you want to get out of meeting with me today? Discussion about fluid retention  Relevant history/background: S/p liver transplant, new CKD diagnosis  Last seen in nutrition clinic 02/15/22. SMART goals from that appointment below: Vegetables: using paper bowl from [employer] to measure vegetable servings, one bowl equals one serving for her, first couple weeks achieved goal 6/7 days, next 6.5 weeks achieved goal 7/7 days Protein: usual proteins include eggs, sausage, chicken, plant burger, on her goal sheet, she counts # servings protein each day, number ranges from 1 to 6 servings per day. Spread out among meals Water (>48 ounces): Drinking from 8 ounce bottles, counted # bottles in a day, hits 6 bottles of water/day about 2/7 days per week. Normal numbers range 2-4. She reports she drinks only bottled water. Barriers to drinking more water include running out of bottled water. Buys from [employer], have not seen 16 ounces bottles. Her liver doctor wants her drinking bottled water.   Assessment:  Eating pattern: 2-3 meals, 1 snacks Usual physical activity: Walking (plan to start 2x/week), chair exercises daily, dancing exercises 2-3x/week Sleep: Patient estimates average of 5-6 hours of sleep/night.   24-hr recall:  (Up at 4 AM)-   10oz Orange juice with meds Breakfast (10 AM)- 1c. pecans 8oz bottle water  Snack ( 11 AM)-  8oz bottle water Lunch ( - PM)-  --- Dinner ( 4 PM)-  [employer] patty, honey mustard sauce, jello salad Snack ( 6 PM)-  8oz bottle water Snack ( 9 PM)-  2c tater tots, 1/2c jello salad, 5oz apple juice with meds Snack ( 10-11 PM)-  8oz bottle water  Typical day? No. Because her aid was not there yesterday to help cook. At lunch time, was sitting watching tv, took a phone call, wasn't hungry and didn't get up to feed herself lunch  [place] handed [place] packets about nutrition from her liver transplant providers. She reported the packets indicated low sodium and included recipes  for soup, stir fry. Our review revealed   Intervention: Completed diet and exercise history, and established: - SMART behavioral goals (Specific, Measurable, Action-oriented, Realistic, Time-based.)    1. Drink a minimum of 48 ounces of water a day. At least 3 large bottles, 6 small bottles of water in a day   2. Limit starchy foods to TWO portions per meal and ONE per snack. ONE portion of a starchy food is equal to the following:  - ONE slice of bread (or its equivalent, such as half of a hamburger bun).  - 1/2 cup of a "scoopable" starchy food such as potatoes or rice.  - 15 grams of Total Carbohydrate as shown on food label.  - 4 ounces of a sweet drink (including fruit juice).   3. Eat 6 servings vegetables daily, divided across meals.  This includes NON-breaded, non-fried vegetables, which can be either fresh or frozen.   - How will you document progress on goals?  Printed goal tracker sheet.   For recommendations and goals, see Patient Instructions.    Follow-up: 6 weeks.    Ezequiel Essex, MD

## 2022-05-03 NOTE — Patient Instructions (Signed)
It was great to see you! Thank you for allowing me to participate in your care!  I recommend that you always bring your medications to each appointment as this makes it easy to ensure you are on the correct medications and helps [place] not miss when refills are needed.  Our plans for today:  - Continue taking your current medications - Return in 4 months for a follow up visit. Please bring your medications with you.   We are checking some labs today, I will call you if they are abnormal will send you a MyChart message or a letter if they are normal.  If you do not hear about your labs in the next 2 weeks please let us know.  Take care and seek immediate care sooner if you develop any concerns.   Dr. [name], DO [facility]

## 2022-05-03 NOTE — Assessment & Plan Note (Addendum)
Pt still follows with allergy and immunology. Uses two controller inhalers (symbicort and spiriva) and albuterol as a rescue inhaler. She does seem to know her inhalers well. Still takes Singulair and zyrtec. She is breathing comfortably on room air today with O2 sat of 100% and lungs clear.

## 2022-05-03 NOTE — Assessment & Plan Note (Signed)
Pt follows with [name] as a transplant pt. CMP in Oct. Showed elevated creatinine to 1.4. It has been elevated over past 2 years. Will check creatinine today.

## 2022-05-04 ENCOUNTER — Emergency Department (HOSPITAL_COMMUNITY)
Admission: EM | Admit: 2022-05-04 | Discharge: 2022-05-04 | Disposition: A | Discharge status: Home / Self Care | Payer: Medicaid Other | Attending: Emergency Medicine | Admitting: Emergency Medicine

## 2022-05-04 ENCOUNTER — Emergency Department (HOSPITAL_COMMUNITY): Payer: Medicaid Other

## 2022-05-04 DIAGNOSIS — Z7951 Long term (current) use of inhaled steroids: Secondary | ICD-10-CM | POA: Insufficient documentation

## 2022-05-04 DIAGNOSIS — G9389 Other specified disorders of brain: Secondary | ICD-10-CM | POA: Diagnosis not present

## 2022-05-04 DIAGNOSIS — S80919A Unspecified superficial injury of unspecified knee, initial encounter: Secondary | ICD-10-CM | POA: Diagnosis not present

## 2022-05-04 DIAGNOSIS — S8991XA Unspecified injury of right lower leg, initial encounter: Secondary | ICD-10-CM | POA: Diagnosis not present

## 2022-05-04 DIAGNOSIS — M25561 Pain in right knee: Secondary | ICD-10-CM | POA: Insufficient documentation

## 2022-05-04 DIAGNOSIS — Z91040 Latex allergy status: Secondary | ICD-10-CM | POA: Diagnosis not present

## 2022-05-04 DIAGNOSIS — S0990XA Unspecified injury of head, initial encounter: Secondary | ICD-10-CM | POA: Diagnosis not present

## 2022-05-04 DIAGNOSIS — W19XXXA Unspecified fall, initial encounter: Secondary | ICD-10-CM | POA: Diagnosis not present

## 2022-05-04 DIAGNOSIS — Z043 Encounter for examination and observation following other accident: Secondary | ICD-10-CM | POA: Diagnosis not present

## 2022-05-04 DIAGNOSIS — M25562 Pain in left knee: Secondary | ICD-10-CM | POA: Diagnosis not present

## 2022-05-04 DIAGNOSIS — W1809XA Striking against other object with subsequent fall, initial encounter: Secondary | ICD-10-CM | POA: Insufficient documentation

## 2022-05-04 DIAGNOSIS — M1711 Unilateral primary osteoarthritis, right knee: Secondary | ICD-10-CM | POA: Diagnosis not present

## 2022-05-04 DIAGNOSIS — S299XXA Unspecified injury of thorax, initial encounter: Secondary | ICD-10-CM | POA: Diagnosis not present

## 2022-05-04 DIAGNOSIS — R519 Headache, unspecified: Secondary | ICD-10-CM | POA: Diagnosis present

## 2022-05-04 LAB — CBC
Hematocrit: 30.2 % — ABNORMAL LOW (ref 34.0–46.6)
Hemoglobin: 10.2 g/dL — ABNORMAL LOW (ref 11.1–15.9)
MCH: 30 pg (ref 26.6–33.0)
MCHC: 33.8 g/dL (ref 31.5–35.7)
MCV: 89 fL (ref 79–97)
Platelets: 138 10*3/uL — ABNORMAL LOW (ref 150–450)
RBC: 3.4 x10E6/uL — ABNORMAL LOW (ref 3.77–5.28)
RDW: 12.5 % (ref 11.7–15.4)
WBC: 3.8 10*3/uL (ref 3.4–10.8)

## 2022-05-04 LAB — BASIC METABOLIC PANEL
BUN/Creatinine Ratio: 20 (ref 9–23)
BUN: 21 mg/dL (ref 6–24)
CO2: 21 mmol/L (ref 20–29)
Calcium: 8.6 mg/dL — ABNORMAL LOW (ref 8.7–10.2)
Chloride: 105 mmol/L (ref 96–106)
Creatinine, Ser: 1.06 mg/dL — ABNORMAL HIGH (ref 0.57–1.00)
Glucose: 94 mg/dL (ref 70–99)
Potassium: 4.5 mmol/L (ref 3.5–5.2)
Sodium: 140 mmol/L (ref 134–144)
eGFR: 65 mL/min/{1.73_m2} (ref >59)

## 2022-05-04 LAB — FERRITIN: Ferritin: 483 ng/mL — ABNORMAL HIGH (ref 15–150)

## 2022-05-04 LAB — TSH: TSH: 2.3 u[IU]/mL (ref 0.450–4.500)

## 2022-05-04 MED ORDER — ACETAMINOPHEN 325 MG PO TABS
650.0000 mg | ORAL_TABLET | Freq: Once | ORAL | Status: AC
  Administered 2022-05-04: 325 mg via ORAL
  Filled 2022-05-04: qty 2

## 2022-05-04 NOTE — ED Notes (Signed)
Pt stated she can only take 1 Tylenol due to doctor orders.

## 2022-05-04 NOTE — Discharge Instructions (Addendum)
The images of your head, chest, right knee were negative on today's visit.    You may alternate ibuprofen or Tylenol to help with your pain.

## 2022-05-04 NOTE — ED Triage Notes (Signed)
Coming from home. Fell over TV cords and hit the corner of the TV stand. 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

## 2022-05-04 NOTE — ED Provider Notes (Signed)
[facility] DEPT Provider Note   CSN: [id number] Arrival date & time: 05/04/22  0938     History  Chief Complaint  Patient presents with   Fall    [name] is a 46 y.o. female.  46 y.o female with a PMH of DUB, Seizures resents to the ED with a chief complaint of head pain status post injury.  Patient reports she was moving her plasma to be when suddenly the plasma TV fell on top of her head she fell to the ground striking her right knee on the ground and also having the rest of the TB fall on the right chest.  She is reporting pain to this area, she has not taken anything for improvement in her symptoms.  No loss of consciousness, currently on no blood thinners, no other injuries reported.  The history is provided by the patient.  Fall Associated symptoms include headaches. Pertinent negatives include no chest pain, no abdominal pain and no shortness of breath.       Home Medications Prior to Admission medications   Medication Sig Start Date End Date Taking? Authorizing Provider  ACCU-CHEK SOFTCLIX LANCETS lancets Use to check blood sugar once daily in the morning prior to eating. 02/13/17   Lovenia Kim, MD  albuterol (PROVENTIL) (2.5 MG/3ML) 0.083% nebulizer solution Take 3 mLs (2.5 mg total) by nebulization every 4 (four) hours as needed for wheezing or shortness of breath. 1 vial via nebulizer every 4-6 hours as needed. 07/18/21   Kozlow, Donnamarie Poag, MD  ammonium lactate (AMLACTIN) 12 % cream Apply topically 2 (two) times daily.    [provider]  azelastine (ASTELIN) 0.1 % nasal spray Place 2 sprays into both nostrils 2 (two) times daily. 07/18/21   Kozlow, Donnamarie Poag, MD  benztropine (COGENTIN) 0.5 MG tablet Take 0.5 mg by mouth at bedtime.    [facility]  Blood Glucose Monitoring Suppl (ACCU-CHEK GUIDE) w/Device KIT 1 each by Does not apply route daily. Use to check blood sugar once daily or as directed. 07/16/18   Zenia Resides, MD   Blood Pressure Monitoring (BLOOD PRESSURE MONITOR AUTOMAT) DEVI Please use to check blood pressure daily/ as needed. 03/31/20   [name], DO  budesonide (PULMICORT) 0.5 MG/2ML nebulizer solution Take 2 mLs (0.5 mg total) by nebulization every 4 (four) hours as needed. 03/30/21   Kozlow, Donnamarie Poag, MD  budesonide-formoterol (SYMBICORT) 160-4.5 MCG/ACT inhaler Inhale 2 puffs into the lungs 2 (two) times daily. Use with spacer and mask. 07/18/21   Kozlow, Donnamarie Poag, MD  carboxymethylcellulose (REFRESH PLUS) 0.5 % SOLN Place 1 drop into both eyes 4 (four) times daily as needed (for dry eyes).    [provider]  cetirizine (ZYRTEC) 10 MG tablet Take 1 tablet (10 mg total) by mouth 2 (two) times daily as needed for allergies (Can use an extra dose during flares). 07/18/21   Kozlow, Donnamarie Poag, MD  cycloSPORINE modified 100 MG CAPS Take 100-125 mg by mouth 2 (two) times daily. Take 100 mg in the AM and 125 mg in the PM    [provider]  divalproex (DEPAKOTE ER) 500 MG 24 hr tablet Take 2 tablets (1,000 mg total) by mouth at bedtime. 06/06/21   Suzzanne Cloud, NP  doxepin (SINEQUAN) 25 MG capsule TK 1 C PO HS 12/24/18   [provider]  famotidine (PEPCID) 40 MG tablet TAKE 1 TABLET(40 MG) BY MOUTH TWICE DAILY 01/25/22   Neldon Mc, Randall Hiss  J, MD  ferrous sulfate 325 (65 FE) MG EC tablet Take 1 tablet by mouth daily. 03/02/20   [provider]  FIBER, GUAR GUM, PO Take by mouth.    [provider]  fluticasone (FLONASE) 50 MCG/ACT nasal spray Place 1 spray into both nostrils in the morning and at bedtime. 07/18/21   Kozlow, Donnamarie Poag, MD  glucose blood (ACCU-CHEK GUIDE) test strip USE TO CHECK BLOOD SUGAR EVERY DAY 08/09/20   [name], DO  levothyroxine (SYNTHROID) 75 MCG tablet TAKE 1 TABLET(75 MCG) BY MOUTH EVERY MORNING 30 MINUTES BEFORE FOOD 05/03/22   [name], DO  MAGNESIUM-OXIDE 400 (241.3 Mg) MG tablet Take 1 tablet by mouth 2 (two) times daily. 12/30/19    [provider]  mometasone (ELOCON) 0.1 % cream Apply topically 2 (two) times daily as needed. Can use on skin one to two times daily if needed. 07/18/21   Kozlow, Donnamarie Poag, MD  montelukast (SINGULAIR) 10 MG tablet Take 1 tablet (10 mg total) by mouth at bedtime. 02/01/22   Kozlow, Donnamarie Poag, MD  Multiple Vitamin (MULTIVITAMIN) tablet Take 1 tablet by mouth daily. 9 am. 10/12/13   Patrecia Pour, NP  mycophenolate (CELLCEPT) 250 MG capsule Take 1 capsule (250 mg total) by mouth 2 (two) times daily. One tablet daily 04/05/20   [name], DO  Nebulizers MISC 1 kit by Does not apply route as directed. 12/22/18   Kozlow, Donnamarie Poag, MD  omeprazole (PRILOSEC) 40 MG capsule Take 1 capsule (40 mg total) by mouth 2 (two) times daily. 07/18/21   Kozlow, Donnamarie Poag, MD  pantoprazole (PROTONIX) 40 MG tablet Take 1 tablet (40 mg total) by mouth 2 (two) times daily. 07/18/21   Kozlow, Donnamarie Poag, MD  polyethylene glycol (MIRALAX / Floria Raveling) packet Take 17 g by mouth daily as needed (for constipation). In water or juice    [provider]  prazosin (MINIPRESS) 1 MG capsule Take 2 mg by mouth at bedtime.     [provider]  PROAIR HFA 108 [phone] Base) MCG/ACT inhaler Inhale 1-2 puffs into the lungs every 4 (four) hours as needed for shortness of breath. 03/30/21   Kozlow, Donnamarie Poag, MD  RA VITAMIN D-3 2000 units CAPS Take 1 capsule by mouth daily.  06/27/16   [provider]  Respiratory Therapy Supplies (NEBULIZER/TUBING/MOUTHPIECE) KIT 2 each by Does not apply route as directed. 12/22/18   Kozlow, Donnamarie Poag, MD  risperiDONE (RISPERDAL) 3 MG tablet Take 3 mg by mouth at bedtime.    [provider]  Spacer/Aero-Hold Chamber Mask MISC Use as directed with inhaler. 04/07/19   Kozlow, Donnamarie Poag, MD  Tiotropium Bromide Monohydrate (SPIRIVA RESPIMAT) 1.25 MCG/ACT AERS Inhale 2 puffs into the lungs daily. 07/18/21   Kozlow, Donnamarie Poag, MD  VENTOLIN HFA 108 [phone] Base) MCG/ACT inhaler Inhale 2 puffs into the  lungs every 4 (four) hours as needed for wheezing or shortness of breath. 07/18/21   Kozlow, Donnamarie Poag, MD      Allergies    Tramadol hcl, Aspirin, Ibuprofen, Penicillins, Pseudoephedrine, Sulfa antibiotics, Acetaminophen, and Latex    Review of Systems   Review of Systems  Constitutional:  Negative for chills and fever.  HENT:  Negative for sore throat.   Respiratory:  Negative for shortness of breath.   Cardiovascular:  Negative for chest pain.  Gastrointestinal:  Negative for abdominal pain, nausea and vomiting.  Musculoskeletal:  Positive for arthralgias.  Neurological:  Positive for  headaches.  All other systems reviewed and are negative.   Physical Exam Updated Vital Signs BP (!) 145/77   Pulse 93   Temp (!) 97.4 F (36.3 C)   Resp 16   SpO2 100%  Physical Exam Vitals and nursing note reviewed.  Constitutional:      General: She is not in acute distress.    Appearance: She is well-developed.  HENT:     Head: Normocephalic and atraumatic.     Mouth/Throat:     Pharynx: No oropharyngeal exudate.  Eyes:     Pupils: Pupils are equal, round, and reactive to light.  Cardiovascular:     Rate and Rhythm: Regular rhythm.     Heart sounds: Normal heart sounds.  Pulmonary:     Effort: Pulmonary effort is normal. No respiratory distress.     Breath sounds: Normal breath sounds.  Abdominal:     General: Bowel sounds are normal. There is no distension.     Palpations: Abdomen is soft.     Tenderness: There is no abdominal tenderness.  Musculoskeletal:        General: No tenderness or deformity.     Cervical back: Normal range of motion.     Right lower leg: No edema.     Left lower leg: No edema.  Skin:    General: Skin is warm and dry.  Neurological:     Mental Status: She is alert and oriented to person, place, and time.     Comments: Alert, oriented, thought content appropriate. Speech fluent without evidence of aphasia. Able to follow 2 step commands without  difficulty.  Cranial Nerves:   V,VII: smile symmetric, facial light touch sensation equal VIII: hearing grossly normal bilaterally  IX,X: midline uvula rise  XI: bilateral shoulder shrug equal and strong XII: midline tongue extension  Motor:  5/5 in upper and lower extremities bilaterally including strong and equal grip strength and dorsiflexion/plantar flexion Sensory: light touch normal in all extremities.  Cerebellar: normal finger-to-nose with bilateral upper extremities, pronator drift negative Gait: normal gait and balance       ED Results / Procedures / Treatments   Labs (all labs ordered are listed, but only abnormal results are displayed) Labs Reviewed - No data to display  EKG None  Radiology DG Ribs Unilateral W/Chest Right  Result Date: 05/04/2022 CLINICAL DATA:  Status post fall.  Hit chest on corner of TV stand. EXAM: RIGHT RIBS AND CHEST - 3+ VIEW COMPARISON:  Chest radiograph 08/23/2020 FINDINGS: Normal cardiomediastinal contours. No pleural effusion or airspace disease. No pneumothorax identified. No displaced rib fractures identified. IMPRESSION: 1. No displaced rib fractures. 2. No acute cardiopulmonary abnormalities. Electronically Signed   By: Kerby Moors M.D.   On: 05/04/2022 12:02   DG Knee 1-2 Views Right  Result Date: 05/04/2022 CLINICAL DATA:  Patient reported from home after tripping over TV cord. Hit corner of TV stand with chest. Right anterior knee pain. EXAM: RIGHT KNEE - 1-2 VIEW COMPARISON:  None Available. FINDINGS: No joint effusion. No signs of acute fracture or dislocation. Mild degenerative changes are noted with lateral compartment narrowing and marginal spur formation and sharpening of the tibial spines. IMPRESSION: 1. No acute findings. 2. Mild osteoarthritis. Electronically Signed   By: Kerby Moors M.D.   On: 05/04/2022 11:58   CT HEAD WO CONTRAST (5MM)  Result Date: 05/04/2022 CLINICAL DATA:  Status post fall EXAM: CT HEAD WITHOUT  CONTRAST TECHNIQUE: Contiguous axial images were obtained from the base  of the skull through the vertex without intravenous contrast. RADIATION DOSE REDUCTION: This exam was performed according to the departmental dose-optimization program which includes automated exposure control, adjustment of the mA and/or kV according to patient size and/or use of iterative reconstruction technique. COMPARISON:  Brain CT 09/04/2017 FINDINGS: Brain: Ventricles and sulci are appropriate for patient's age. No evidence for acute cortically based infarct, intracranial hemorrhage, mass lesion or mass-effect. Bilateral basal ganglia calcifications. Vascular: No hyperdense vessel or unexpected calcification. Skull: Normal. Negative for fracture or focal lesion. Sinuses/Orbits: Pulmonic mucosal thickening within the left maxillary sinus. Motion artifact limits evaluation. Other: None IMPRESSION: 1. No acute intracranial process. 2. Motion artifact limits evaluation. Electronically Signed   By: Lovey Newcomer M.D.   On: 05/04/2022 07:58    Procedures Procedures    Medications Ordered in ED Medications  acetaminophen (TYLENOL) tablet 650 mg (325 mg Oral Given 05/04/22 1207)    ED Course/ Medical Decision Making/ A&P                           Medical Decision Making Amount and/or Complexity of Data Reviewed Radiology: ordered.  Risk OTC drugs.   Patient presents to the ED with a chief complaint of head injury which occurred when she was trying to move her plasma TV reports that It fell on her head also struck the right chest, she fell landing on her right knee complaining of pain to the knee.  During evaluation she is overall nontoxic-appearing, looks in no distress.  No signs of obvious trauma to her head.  Lungs are clear to auscultation without any pain with palpation along the chest wall however she reports pain around the right lower chest.  She is ambulatory in the ED with a steady gait.  All imaging has been  negative.  CT head without any intracranial pathology.  I discussed patient has not any Tylenol for his ibuprofen or to help with her symptoms.  Patient is hemodynamically stable for discharge.    Portions of this note were generated with [employer]. Dictation errors may occur despite best attempts at proofreading.   Final Clinical Impression(s) / ED Diagnoses Final diagnoses:  Injury of head, initial encounter  Fall, initial encounter    Rx / DC Orders ED Discharge Orders     None         Janeece Fitting, Hershal Coria 05/04/22 1232    Milton Ferguson, MD 05/06/22 [phone]

## 2022-05-05 DIAGNOSIS — J45909 Unspecified asthma, uncomplicated: Secondary | ICD-10-CM | POA: Diagnosis not present

## 2022-05-07 DIAGNOSIS — J45909 Unspecified asthma, uncomplicated: Secondary | ICD-10-CM | POA: Diagnosis not present

## 2022-05-08 DIAGNOSIS — Z944 Liver transplant status: Secondary | ICD-10-CM | POA: Diagnosis not present

## 2022-05-08 DIAGNOSIS — Z7689 Persons encountering health services in other specified circumstances: Secondary | ICD-10-CM | POA: Diagnosis not present

## 2022-05-08 DIAGNOSIS — K5900 Constipation, unspecified: Secondary | ICD-10-CM | POA: Diagnosis not present

## 2022-05-08 DIAGNOSIS — Z1211 Encounter for screening for malignant neoplasm of colon: Secondary | ICD-10-CM | POA: Diagnosis not present

## 2022-05-09 DIAGNOSIS — Z7951 Long term (current) use of inhaled steroids: Secondary | ICD-10-CM | POA: Diagnosis not present

## 2022-05-09 DIAGNOSIS — Z944 Liver transplant status: Secondary | ICD-10-CM | POA: Diagnosis not present

## 2022-05-09 DIAGNOSIS — F259 Schizoaffective disorder, unspecified: Secondary | ICD-10-CM | POA: Diagnosis not present

## 2022-05-09 DIAGNOSIS — Z79899 Other long term (current) drug therapy: Secondary | ICD-10-CM | POA: Diagnosis not present

## 2022-05-09 DIAGNOSIS — J45909 Unspecified asthma, uncomplicated: Secondary | ICD-10-CM | POA: Diagnosis not present

## 2022-05-10 DIAGNOSIS — J45909 Unspecified asthma, uncomplicated: Secondary | ICD-10-CM | POA: Diagnosis not present

## 2022-05-11 ENCOUNTER — Inpatient Hospital Stay: Payer: Medicaid Other | Admitting: Student

## 2022-05-11 DIAGNOSIS — J45909 Unspecified asthma, uncomplicated: Secondary | ICD-10-CM | POA: Diagnosis not present

## 2022-05-12 DIAGNOSIS — J45909 Unspecified asthma, uncomplicated: Secondary | ICD-10-CM | POA: Diagnosis not present

## 2022-05-13 DIAGNOSIS — J45909 Unspecified asthma, uncomplicated: Secondary | ICD-10-CM | POA: Diagnosis not present

## 2022-05-15 ENCOUNTER — Encounter: Payer: Self-pay | Admitting: Student

## 2022-05-15 ENCOUNTER — Other Ambulatory Visit: Payer: Self-pay

## 2022-05-15 ENCOUNTER — Ambulatory Visit (INDEPENDENT_AMBULATORY_CARE_PROVIDER_SITE_OTHER): Payer: Medicaid Other | Admitting: Student

## 2022-05-15 VITALS — BP 110/77 | HR 88 | Wt 230.0 lb

## 2022-05-15 DIAGNOSIS — M25561 Pain in right knee: Secondary | ICD-10-CM

## 2022-05-15 DIAGNOSIS — J45909 Unspecified asthma, uncomplicated: Secondary | ICD-10-CM | POA: Diagnosis not present

## 2022-05-15 NOTE — Patient Instructions (Addendum)
It was great to see you! Thank you for allowing me to participate in your care!  I recommend that you always bring your medications to each appointment as this makes it easy to ensure you are on the correct medications and helps [place] not miss when refills are needed.  Our plans for today:  - I recommend going to the pharmacy and getting Diclofenac gel (Voltaren gel). Apply this to your knee up to four times a day for pain. You can also take Tylenol 650 mg every 4 to 6 hours as needed.  -Return in 3 months for a follow up visit and bring your medications to that appointment.   Take care and seek immediate care sooner if you develop any concerns.   Dr. [name], DO [facility]

## 2022-05-15 NOTE — Progress Notes (Signed)
    SUBJECTIVE:   CHIEF COMPLAINT / HPI:   Head injury and fall Pt seen in ED on 12/08 after she slipped on her computer charger and her TV fell on her head and she fell on R knee. Head CT negative for intracranial pathology and X ray of ribs and R knee with no acute process.   She is here today for follow up after ED and stating her R knee started hurting again today right before this apt when she was getting off the bus.   PERTINENT  PMH / PSH: ***  OBJECTIVE:   Vitals:   05/15/22 1331  BP: 110/77  Pulse: 88  SpO2: 100%      General: NAD, pleasant, able to participate in exam Cardiac: RRR, no murmurs. Respiratory: CTAB, normal effort, No wheezes, rales or rhonchi Abdomen: Bowel sounds present, nontender, nondistended, no hepatosplenomegaly. Extremities: no edema or cyanosis. Skin: warm and dry, no rashes noted Neuro: alert, no obvious focal deficits Psych: Normal affect and mood  ASSESSMENT/PLAN:   No problem-specific Assessment & Plan notes found for this encounter.     Dr. [name], [place]    {    This will disappear when note is signed, click to select method of visit    :1}

## 2022-05-16 DIAGNOSIS — M25561 Pain in right knee: Secondary | ICD-10-CM | POA: Insufficient documentation

## 2022-05-16 DIAGNOSIS — Z7951 Long term (current) use of inhaled steroids: Secondary | ICD-10-CM | POA: Diagnosis not present

## 2022-05-16 DIAGNOSIS — Z944 Liver transplant status: Secondary | ICD-10-CM | POA: Diagnosis not present

## 2022-05-16 DIAGNOSIS — F259 Schizoaffective disorder, unspecified: Secondary | ICD-10-CM | POA: Diagnosis not present

## 2022-05-16 DIAGNOSIS — J45909 Unspecified asthma, uncomplicated: Secondary | ICD-10-CM | POA: Diagnosis not present

## 2022-05-16 DIAGNOSIS — Z79899 Other long term (current) drug therapy: Secondary | ICD-10-CM | POA: Diagnosis not present

## 2022-05-16 HISTORY — DX: Pain in right knee: M25.561

## 2022-05-16 NOTE — Assessment & Plan Note (Signed)
Reassured patient that head CT, chest x-ray, x-ray of right knee all showed no acute injury in the ED.  Advised patient that she could still have some lingering knee pain from the fall.  It is reassuring that she is able to walk normally and there is no edema.  Also advised patient that the x-ray did show some osteoarthritis in the right knee which could also be causing some of her pain.  Advised patient to apply Voltaren gel up to 4 times a day and take Tylenol 650 mg every 4-6 hours as needed for pain.  Patient to return if worsens.

## 2022-05-17 ENCOUNTER — Telehealth: Payer: Self-pay

## 2022-05-17 DIAGNOSIS — J45909 Unspecified asthma, uncomplicated: Secondary | ICD-10-CM | POA: Diagnosis not present

## 2022-05-17 NOTE — Telephone Encounter (Signed)
Patient calls nurse line reporting starting her menstrual period this morning.   She reports her LMP was October 2022.   She reports she woke this morning and the toilet was full of blood. She denies any abdominal cramping.   She reports she has had a pad on ever since and reports minimal bleeding since 8 am.   Patient denies any urinary symptoms.   Patient scheduled for 12/28 for evaluation as it has been over one year since her last period.   Patient advised to monitor her bleeding and to call if symptoms worsen.   Patient voiced understanding.

## 2022-05-17 NOTE — Progress Notes (Deleted)
    SUBJECTIVE:   CHIEF COMPLAINT / HPI:   Period 12/21 after no period since 02/2021  PERTINENT  PMH / PSH: ***  OBJECTIVE:   There were no vitals taken for this visit. ***  General: NAD, pleasant, able to participate in exam Cardiac: RRR, no murmurs. Respiratory: CTAB, normal effort, No wheezes, rales or rhonchi Abdomen: Bowel sounds present, nontender, nondistended Extremities: no edema or cyanosis. Skin: warm and dry, no rashes noted Neuro: alert, no obvious focal deficits Psych: Normal affect and mood  ASSESSMENT/PLAN:   No problem-specific Assessment & Plan notes found for this encounter.     Dr. Colletta Maryland, [place]    {    This will disappear when note is signed, click to select method of visit    :1}

## 2022-05-18 DIAGNOSIS — J45909 Unspecified asthma, uncomplicated: Secondary | ICD-10-CM | POA: Diagnosis not present

## 2022-05-20 DIAGNOSIS — J45909 Unspecified asthma, uncomplicated: Secondary | ICD-10-CM | POA: Diagnosis not present

## 2022-05-22 DIAGNOSIS — J45909 Unspecified asthma, uncomplicated: Secondary | ICD-10-CM | POA: Diagnosis not present

## 2022-05-23 DIAGNOSIS — J45909 Unspecified asthma, uncomplicated: Secondary | ICD-10-CM | POA: Diagnosis not present

## 2022-05-23 DIAGNOSIS — Z944 Liver transplant status: Secondary | ICD-10-CM | POA: Diagnosis not present

## 2022-05-23 DIAGNOSIS — Z79899 Other long term (current) drug therapy: Secondary | ICD-10-CM | POA: Diagnosis not present

## 2022-05-23 DIAGNOSIS — Z7951 Long term (current) use of inhaled steroids: Secondary | ICD-10-CM | POA: Diagnosis not present

## 2022-05-23 DIAGNOSIS — F259 Schizoaffective disorder, unspecified: Secondary | ICD-10-CM | POA: Diagnosis not present

## 2022-05-24 ENCOUNTER — Ambulatory Visit: Payer: Medicaid Other | Admitting: Family Medicine

## 2022-05-24 DIAGNOSIS — J45909 Unspecified asthma, uncomplicated: Secondary | ICD-10-CM | POA: Diagnosis not present

## 2022-05-25 DIAGNOSIS — J45909 Unspecified asthma, uncomplicated: Secondary | ICD-10-CM | POA: Diagnosis not present

## 2022-05-26 DIAGNOSIS — J45909 Unspecified asthma, uncomplicated: Secondary | ICD-10-CM | POA: Diagnosis not present

## 2022-05-29 DIAGNOSIS — J45909 Unspecified asthma, uncomplicated: Secondary | ICD-10-CM | POA: Diagnosis not present

## 2022-05-30 ENCOUNTER — Other Ambulatory Visit: Payer: Self-pay | Admitting: Neurology

## 2022-05-30 DIAGNOSIS — Z7951 Long term (current) use of inhaled steroids: Secondary | ICD-10-CM | POA: Diagnosis not present

## 2022-05-30 DIAGNOSIS — Z79899 Other long term (current) drug therapy: Secondary | ICD-10-CM | POA: Diagnosis not present

## 2022-05-30 DIAGNOSIS — F259 Schizoaffective disorder, unspecified: Secondary | ICD-10-CM | POA: Diagnosis not present

## 2022-05-30 DIAGNOSIS — Z944 Liver transplant status: Secondary | ICD-10-CM | POA: Diagnosis not present

## 2022-05-30 DIAGNOSIS — J45909 Unspecified asthma, uncomplicated: Secondary | ICD-10-CM | POA: Diagnosis not present

## 2022-05-30 NOTE — Telephone Encounter (Signed)
Rx filled

## 2022-05-31 DIAGNOSIS — J45909 Unspecified asthma, uncomplicated: Secondary | ICD-10-CM | POA: Diagnosis not present

## 2022-06-01 DIAGNOSIS — J45909 Unspecified asthma, uncomplicated: Secondary | ICD-10-CM | POA: Diagnosis not present

## 2022-06-04 DIAGNOSIS — J45909 Unspecified asthma, uncomplicated: Secondary | ICD-10-CM | POA: Diagnosis not present

## 2022-06-05 ENCOUNTER — Ambulatory Visit: Payer: Medicaid Other | Admitting: Student

## 2022-06-06 DIAGNOSIS — Z7951 Long term (current) use of inhaled steroids: Secondary | ICD-10-CM | POA: Diagnosis not present

## 2022-06-06 DIAGNOSIS — Z944 Liver transplant status: Secondary | ICD-10-CM | POA: Diagnosis not present

## 2022-06-06 DIAGNOSIS — J45909 Unspecified asthma, uncomplicated: Secondary | ICD-10-CM | POA: Diagnosis not present

## 2022-06-06 DIAGNOSIS — Z79899 Other long term (current) drug therapy: Secondary | ICD-10-CM | POA: Diagnosis not present

## 2022-06-06 DIAGNOSIS — F259 Schizoaffective disorder, unspecified: Secondary | ICD-10-CM | POA: Diagnosis not present

## 2022-06-07 ENCOUNTER — Ambulatory Visit: Payer: Medicaid Other | Admitting: Neurology

## 2022-06-07 ENCOUNTER — Encounter: Payer: Self-pay | Admitting: Neurology

## 2022-06-07 VITALS — BP 134/91 | HR 78 | Ht 59.0 in | Wt 227.5 lb

## 2022-06-07 DIAGNOSIS — J45909 Unspecified asthma, uncomplicated: Secondary | ICD-10-CM | POA: Diagnosis not present

## 2022-06-07 DIAGNOSIS — G40909 Epilepsy, unspecified, not intractable, without status epilepticus: Secondary | ICD-10-CM

## 2022-06-07 DIAGNOSIS — Z7689 Persons encountering health services in other specified circumstances: Secondary | ICD-10-CM | POA: Diagnosis not present

## 2022-06-07 DIAGNOSIS — F79 Unspecified intellectual disabilities: Secondary | ICD-10-CM

## 2022-06-07 MED ORDER — DIVALPROEX SODIUM ER 500 MG PO TB24: 1000.0000 mg | ORAL_TABLET | Freq: Every evening | ORAL | 3 refills | Status: DC

## 2022-06-07 NOTE — Progress Notes (Signed)
ASSESSMENT AND PLAN  year old female    39.  Seizure disorder 2.  Intellectual disability 3.  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   Normal MRI of brain in July 2020.  HISTORY : [name] is a 47 year old female, has been followed up by our clinic for many years for epilepsy, she has mild mental retardation, her uncle Dilia Alemany lives next door to her, is her power of attorney.  She has longstanding history of epilepsy, well-controlled on Depakote ER 500 mg 2 tablets every night,  She had a history of primary biliary cirrhosis, had liver transplant, is followed up by [name] transplant team, doing well,  Laboratory evaluation in December 2023: Ferritin level 483, CMP creatinine 1.06, calcium 8.6, hemoglobin of 10.2, normal TSH, A1c  Around 2015, she was having frequent seizure, also developed psychosis episode taking Keppra or Vimpat at that time, then switched to single agent Depakote   Most recent seizure-like episode was in September 2023, woke up her bed was unkept, which is very different from her baseline  REVIEW OF SYSTEMS: Out of a complete 14 system review of symptoms, the patient complains only of the following symptoms, and all other reviewed systems are negative.  n/a  PHYSICAL EXAM  Vitals:   06/07/22 1126  BP: (!) 134/91  Pulse: 78  Weight: 227 lb 8 oz (103.2 kg)  Height: '4\' 11"'$  (1.499 m)    Body mass index is 45.95 kg/m.  PHYSICAL EXAMNIATION:  Gen: NAD, conversant, well nourised, well groomed                      Cardiovascular: Regular rate rhythm, no peripheral edema, warm, nontender. Eyes: Conjunctivae clear without exudates or hemorrhage Neck: Supple, no carotid bruits. Pulmonary: Clear to auscultation bilaterally   NEUROLOGICAL EXAM:  MENTAL STATUS: Speech/cognition: Awake, alert oriented to history taking and casual conversation  CRANIAL NERVES: CN II: Visual fields are full to confrontation.  Pupils are round equal and briskly reactive to light. CN III, IV, VI: Left eye dominant, right esotropia CN V: Facial sensation is intact to pinprick in all 3 divisions bilaterally. Corneal responses are intact.  CN VII: Face is symmetric with normal eye closure and smile. CN VIII: Hearing is normal to casual conversation CN IX, X: Palate elevates symmetrically. Phonation is normal. CN XI: Head turning and shoulder shrug are intact CN XII: Tongue is midline with normal movements and no atrophy.  MOTOR: There is no pronator drift of out-stretched arms. Muscle bulk and tone are normal. Muscle strength is normal.  REFLEXES: Reflexes are 2+ and symmetric at the biceps, triceps, knees, and ankles. Plantar responses are flexor.  SENSORY: Intact to light touch, pinprick, positional and vibratory sensation are intact in fingers and toes.  COORDINATION: Rapid alternating movements and fine finger movements are intact. There is no dysmetria on finger-to-nose and heel-knee-shin.    GAIT/STANCE: Need push-up to get up from seated position, cautious  ALLERGIES: Allergies  Allergen Reactions   Tramadol Hcl  Itching and Nausea And Vomiting    Causes SEIZURES   Aspirin Nausea And Vomiting    Due to Stomach ulcer   Ibuprofen Other (See Comments)    Due to stomach ulcer   Penicillins Itching, Swelling and Rash    Breakout  Has patient had a PCN reaction causing immediate rash, facial/tongue/throat swelling, SOB or lightheadedness with hypotension: Yes Has patient had a PCN reaction causing  severe rash involving mucus membranes or skin necrosis: Yes Has patient had a PCN reaction that required hospitalization: Yes Has patient had a PCN reaction occurring within the last 10 years: No If all of the above answers are "NO", then may proceed with Cephalosporin use.      Pseudoephedrine Itching and Swelling    Tongue swelling   Sulfa Antibiotics Nausea And Vomiting   Acetaminophen     Other reaction(s): elevated LFT's   Latex Itching and Rash    HOME MEDICATIONS: Outpatient Medications Prior to Visit  Medication Sig Dispense Refill   ACCU-CHEK SOFTCLIX LANCETS lancets Use to check blood sugar once daily in the morning prior to eating. 100 each 2   albuterol (PROVENTIL) (2.5 MG/3ML) 0.083% nebulizer solution Take 3 mLs (2.5 mg total) by nebulization every 4 (four) hours as needed for wheezing or shortness of breath. 1 vial via nebulizer every 4-6 hours as needed. 150 mL 1   ammonium lactate (AMLACTIN) 12 % cream Apply topically 2 (two) times daily.     azelastine (ASTELIN) 0.1 % nasal spray Place 2 sprays into both nostrils 2 (two) times daily. 30 mL 5   benztropine (COGENTIN) 0.5 MG tablet Take 0.5 mg by mouth at bedtime.     Blood Glucose Monitoring Suppl (ACCU-CHEK GUIDE) w/Device KIT 1 each by Does not apply route daily. Use to check blood sugar once daily or as directed. 1 kit 0   Blood Pressure Monitoring (BLOOD PRESSURE MONITOR AUTOMAT) DEVI Please use to check blood pressure daily/ as needed. 1 each 0   budesonide (PULMICORT) 0.5 MG/2ML nebulizer solution Take 2 mLs (0.5 mg total) by nebulization every 4 (four) hours as needed. 150 mL 1   budesonide-formoterol (SYMBICORT) 160-4.5 MCG/ACT inhaler Inhale 2 puffs into the lungs 2 (two) times daily. Use with spacer and mask. 10.2 g 5   carboxymethylcellulose (REFRESH PLUS) 0.5 % SOLN Place 1 drop into both eyes 4 (four) times daily as needed (for dry eyes).     cetirizine (ZYRTEC) 10 MG tablet Take 1 tablet (10 mg total) by  mouth 2 (two) times daily as needed for allergies (Can use an extra dose during flares). 180 tablet 1   cycloSPORINE modified 100 MG CAPS Take 100-125 mg by mouth 2 (two) times daily. Take 100 mg in the AM and 125 mg in the PM     divalproex (DEPAKOTE ER) 500 MG 24 hr tablet TAKE 2 TABLETS(1000 MG) BY MOUTH AT BEDTIME 180 tablet 0   doxepin (SINEQUAN) 25 MG capsule TK 1 C PO HS     famotidine (PEPCID) 40 MG tablet TAKE 1 TABLET(40 MG) BY MOUTH TWICE DAILY 180 tablet 1   ferrous sulfate 325 (65 FE) MG EC tablet Take 1 tablet by mouth daily.     FIBER, GUAR GUM, PO Take by mouth.     fluticasone (FLONASE) 50 MCG/ACT nasal spray Place 1 spray into both nostrils in the morning and at bedtime. 16 g 5   glucose blood (ACCU-CHEK GUIDE) test strip USE TO CHECK BLOOD SUGAR EVERY DAY  50 strip 10   levothyroxine (SYNTHROID) 75 MCG tablet TAKE 1 TABLET(75 MCG) BY MOUTH EVERY MORNING 30 MINUTES BEFORE FOOD 90 tablet 0   MAGNESIUM-OXIDE 400 (241.3 Mg) MG tablet Take 1 tablet by mouth 2 (two) times daily.     mometasone (ELOCON) 0.1 % cream Apply topically 2 (two) times daily as needed. Can use on skin one to two times daily if needed. 50 g 1   montelukast (SINGULAIR) 10 MG tablet Take 1 tablet (10 mg total) by mouth at bedtime. 90 tablet 1   Multiple Vitamin (MULTIVITAMIN) tablet Take 1 tablet by mouth daily. 9 am.     mycophenolate (CELLCEPT) 250 MG capsule Take 1 capsule (250 mg total) by mouth 2 (two) times daily. One tablet daily 1 capsule 0   Nebulizers MISC 1 kit by Does not apply route as directed. 1 kit 1   omeprazole (PRILOSEC) 40 MG capsule Take 1 capsule (40 mg total) by mouth 2 (two) times daily. 60 capsule 5   pantoprazole (PROTONIX) 40 MG tablet Take 1 tablet (40 mg total) by mouth 2 (two) times daily. 60 tablet 5   polyethylene glycol (MIRALAX / GLYCOLAX) packet Take 17 g by mouth daily as needed (for constipation). In water or juice     prazosin (MINIPRESS) 1 MG capsule Take 2 mg by mouth at  bedtime.      PROAIR HFA 108 (90 Base) MCG/ACT inhaler Inhale 1-2 puffs into the lungs every 4 (four) hours as needed for shortness of breath. 18 g 1   RA VITAMIN D-3 2000 units CAPS Take 1 capsule by mouth daily.   0   Respiratory Therapy Supplies (NEBULIZER/TUBING/MOUTHPIECE) KIT 2 each by Does not apply route as directed. 2 kit 5   risperiDONE (RISPERDAL) 3 MG tablet Take 3 mg by mouth at bedtime.     Spacer/Aero-Hold Chamber Mask MISC Use as directed with inhaler. 1 each 1   Tiotropium Bromide Monohydrate (SPIRIVA RESPIMAT) 1.25 MCG/ACT AERS Inhale 2 puffs into the lungs daily. 4 g 5   VENTOLIN HFA 108 (90 Base) MCG/ACT inhaler Inhale 2 puffs into the lungs every 4 (four) hours as needed for wheezing or shortness of breath. 18 g 1   No facility-administered medications prior to visit.    PAST MEDICAL HISTORY: Past Medical History:  Diagnosis Date   Abnormal vaginal bleeding    Allergy    Anemia    Arthritis    right knee   Asthma 09-23-12   Asthma somewhat causing increase wheezing and mild congestion at present   Biliary cirrhosis ([place]) 04/26/11   Constipation    DUB (dysfunctional uterine bleeding)    Fall at home, initial encounter 01/21/2019   GERD (gastroesophageal reflux disease)    Health care maintenance 03/06/2014   Intellectual disability    Left-sided Bell's palsy 12/22/2018   Liver transplant recipient [facility])    Lumbar back pain 04/06/2014   PONV (postoperative nausea and vomiting)    Primary biliary cirrhosis ([place]) 05/07/2011   Productive cough 07/13/2019   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 seizures   Vitamin D deficiency     PAST SURGICAL HISTORY: Past Surgical History:  Procedure Laterality Date   adenonoidectomy     APPENDECTOMY     CHOLECYSTECTOMY     COLONOSCOPY W/ BIOPSIES     ESOPHAGOGASTRODUODENOSCOPY (EGD) WITH PROPOFOL N/A 10/01/2012   Procedure: ESOPHAGOGASTRODUODENOSCOPY (EGD) WITH PROPOFOL;  Surgeon:  Arta Silence, MD;  Location: [facility];  Service: Endoscopy;  Laterality: N/A;   eustacean tubes     EYE MUSCLE SURGERY     LIVER TRANSPLANT      FAMILY HISTORY: Family History  Problem Relation Age of Onset   Diabetes Mother    Heart disease Mother    COPD Mother    Breast cancer Cousin    Allergic rhinitis Neg Hx    Angioedema Neg Hx    Asthma Neg Hx    Atopy Neg Hx    Eczema Neg Hx    Immunodeficiency Neg Hx    Urticaria Neg Hx     SOCIAL HISTORY: Social History   Socioeconomic History   Marital status: Single    Spouse name: Not on file   Number of children: 0   Years of education: 12 th   Highest education level: Not on file  Occupational History    Comment: Disabled  Tobacco Use   Smoking status: Never   Smokeless tobacco: Never  Vaping Use   Vaping Use: Never used  Substance and Sexual Activity   Alcohol use: No    Alcohol/week: 0.0 standard drinks of alcohol   Drug use: No   Sexual activity: Never  Other Topics Concern   Not on file  Social History Narrative   Lives alone but uncle and his wife live next door and are her primary caregivers. Sonia Side (uncle) can be reached at [phone].    Education High school education.   Disabled.   Right handed.   Caffeine tea and coffee daily.         Current Social History   (Please include date ( . td) when updating information )      Who lives at home: lives  alone 07/05/2016    Transportation: uses Medicaid transportation to and from appointments 07/05/2016   Important Relationships & Pets: Lynnda Shields lives next door 07/05/2016    Current Stressors: Person Care aid will stop this month 07/05/2016   Work / Education:  Receives disability 07/05/2016   Religious / Personal Beliefs: has strong religious beliefs 07/05/2016   Interests / Fun: Enjoys going to bible study  07/05/2016   Other: enjoys reading her bible 07/05/2016                                                                                                          Social Determinants of Health   Financial Resource Strain: Low Risk  (02/08/2021)   Overall Financial Resource Strain (CARDIA)    Difficulty of Paying Living Expenses: Not very hard  Food Insecurity: No Food Insecurity (02/08/2021)   Hunger Vital Sign    Worried About Running Out of Food in the Last Year: Never true    Ran Out of Food in the Last Year: Never true  Transportation Needs: Unmet Transportation Needs (05/03/2022)   PRAPARE - Hydrologist (Medical): Yes    Lack of Transportation (Non-Medical): No  Physical Activity: Not on file  Stress: No Stress Concern Present (02/08/2021)   [employer] - Occupational Stress Questionnaire    Feeling of Stress : Only a little  Social Connections: Not on file  Intimate Partner Violence: Not on file    Marcial Pacas, M.D. Ph.D.  [facility] [place], [place] [address] Phone: [phone] Fax:      [phone]

## 2022-06-08 DIAGNOSIS — J45909 Unspecified asthma, uncomplicated: Secondary | ICD-10-CM | POA: Diagnosis not present

## 2022-06-08 LAB — VALPROIC ACID LEVEL: Valproic Acid Lvl: 72 ug/mL (ref 50–100)

## 2022-06-08 LAB — FOLATE: Folate: 17.4 ng/mL (ref >3.0)

## 2022-06-08 LAB — VITAMIN B12: Vitamin B-12: 1035 pg/mL (ref 232–1245)

## 2022-06-11 DIAGNOSIS — J45909 Unspecified asthma, uncomplicated: Secondary | ICD-10-CM | POA: Diagnosis not present

## 2022-06-12 DIAGNOSIS — J45909 Unspecified asthma, uncomplicated: Secondary | ICD-10-CM | POA: Diagnosis not present

## 2022-06-13 DIAGNOSIS — J45909 Unspecified asthma, uncomplicated: Secondary | ICD-10-CM | POA: Diagnosis not present

## 2022-06-13 DIAGNOSIS — Z7951 Long term (current) use of inhaled steroids: Secondary | ICD-10-CM | POA: Diagnosis not present

## 2022-06-13 DIAGNOSIS — Z944 Liver transplant status: Secondary | ICD-10-CM | POA: Diagnosis not present

## 2022-06-13 DIAGNOSIS — F259 Schizoaffective disorder, unspecified: Secondary | ICD-10-CM | POA: Diagnosis not present

## 2022-06-13 DIAGNOSIS — Z79899 Other long term (current) drug therapy: Secondary | ICD-10-CM | POA: Diagnosis not present

## 2022-06-14 ENCOUNTER — Ambulatory Visit (INDEPENDENT_AMBULATORY_CARE_PROVIDER_SITE_OTHER): Payer: Medicaid Other | Admitting: Family Medicine

## 2022-06-14 ENCOUNTER — Ambulatory Visit: Payer: Medicaid Other

## 2022-06-14 VITALS — Ht 59.0 in | Wt 227.2 lb

## 2022-06-14 DIAGNOSIS — J45909 Unspecified asthma, uncomplicated: Secondary | ICD-10-CM | POA: Diagnosis not present

## 2022-06-14 DIAGNOSIS — Z944 Liver transplant status: Secondary | ICD-10-CM | POA: Diagnosis not present

## 2022-06-14 NOTE — Progress Notes (Signed)
What do you want to get out of meeting with me today? Get weight down to 200 lbs before May when she sees her hepatologist  Relevant history/background: S/p liver transplant, new CKD diagnosis  Last seen in nutrition clinic 05/03/22. SMART goals from that appointment below: 6 Vegetables/day:  2 starches per meal:  Water (>48 ounces):   Goal sheet reviewed, difficult to interpret as not using the same units consistently. It appears she is meeting her vegetable goal about half of the time. She sometimes cooks meals with the help of her aide.  Assessment:  Eating pattern: 3 meals a day, 1 snack Usual physical activity: dance videos 30 min 3x/wk, leg video exercises (lunges, knee bends) 30 min 2x/wk Sleep: Patient estimates average of 7 hours of sleep/night.   24-hr recall:  (Up at 4 AM)-    Breakfast ( 9 AM)- Penne pasta 1c with broccoli 1c and marinara, chicken strips. Sunny D 18 oz  Snack (  AM)-   --- Lunch ( 1 PM)-  Penne pasta 1c with broccoli 1c and marinara, chicken strips. Water 18 oz Dinner ( 4 PM)-  [employer] 1c with broccoli 1c and marinara, chicken strips. Water 18 oz Snack ( 8 PM)- Ice cream, regular 2 tbsp. Water 18 oz  Water 18 oz total between meals   Typical day? Yes.     Intervention: Completed diet and exercise history, and established: - SMART behavioral goals (Specific, Measurable, Action-oriented, Realistic, Time-based.)    1. Eat 6 servings vegetables daily, divided across meals.  This includes NON-breaded, non-fried vegetables, which can be either fresh or frozen.   - one portion of a cooked vegetable is 1/2 cup   2. Limit starchy foods to TWO portions per meal and ONE per snack. Measure carb portions at each meal. ONE portion of a starchy food is equal to the following:  - ONE slice of bread (or its equivalent, such as half of a hamburger bun).  - 1/2 cup of a "scoopable" starchy food such as potatoes or rice.  - 15 grams of Total Carbohydrate as shown on  food label.  - 4 ounces of a sweet drink (including fruit juice).  Limit sweet drinks to 4 oz at a time if you have a sweet drink. We strongly encourage drinking water as your main beverage.  - How will you document progress on goals?  Printed goal tracker sheet.  Recommended that she document number of vegetables/carbs for each meal (should have 3 numbers for each cell)   For recommendations and goals, see Patient Instructions.    Follow-up: 6 weeks.    Zola Button, MD

## 2022-06-14 NOTE — Patient Instructions (Signed)
It was wonderful to see you today. Thank you for allowing me to be a part of your care. Below is a short summary of what we discussed at your visit today:  Nutrition Most important information from packet of info from dietician from [name]: Every day, eat 3 nutritionally balanced meals: breakfast, lunch, and dinner Breakfast: protein, carbohydrate. Fruit is optional.  Lunch and dinner: 2 Vegetables, 2 carbohydrates, 1 protein Use fats and oils in moderation   Limit how much you are frying foods Drink PLENTY of plain water through the day Limit juices or sweet drinks Choose fruits and vegetables and other WHOLE REAL foods Limit highly processed foods Highly processed foods = anything that comes in a plastic wrapper American cheese "Instant" foods Pre-made frozen meals Nuts: calories and good fats from nuts add up in a hurry! If you eat nuts, aim for no more than 1/4 cup of nuts at a time  [  ] Check the sodium content on the soy sauce. Check how much is in a serving size. Call Dr. Jenne Campus at [phone] and let her know what the label says.   Goals for the coming months:   1. Eat 6 servings vegetables daily, divided across meals.  This includes NON-breaded, non-fried vegetables, which can be either fresh or frozen.   - one portion of a cooked vegetable is 1/2 cup   2. Limit starchy foods to TWO portions per meal and ONE per snack. ONE portion of a starchy food is equal to the following:  - ONE slice of bread (or its equivalent, such as half of a hamburger bun).  - 1/2 cup of a "scoopable" starchy food such as potatoes or rice.  - 15 grams of Total Carbohydrate as shown on food label.  - 4 ounces of a sweet drink (including fruit juice).     If you have any questions or concerns, please do not hesitate to contact us via phone or MyChart message.   Zola Button, MD

## 2022-06-15 DIAGNOSIS — J45909 Unspecified asthma, uncomplicated: Secondary | ICD-10-CM | POA: Diagnosis not present

## 2022-06-18 ENCOUNTER — Ambulatory Visit: Payer: Medicaid Other | Admitting: Podiatry

## 2022-06-20 DIAGNOSIS — J45909 Unspecified asthma, uncomplicated: Secondary | ICD-10-CM | POA: Diagnosis not present

## 2022-06-21 DIAGNOSIS — J45909 Unspecified asthma, uncomplicated: Secondary | ICD-10-CM | POA: Diagnosis not present

## 2022-06-22 DIAGNOSIS — J45909 Unspecified asthma, uncomplicated: Secondary | ICD-10-CM | POA: Diagnosis not present

## 2022-06-22 NOTE — Progress Notes (Deleted)
    SUBJECTIVE:   CHIEF COMPLAINT / HPI:   ***  PERTINENT  PMH / PSH: ***  OBJECTIVE:   There were no vitals taken for this visit. ***  General: NAD, pleasant, able to participate in exam Cardiac: RRR, no murmurs. Respiratory: CTAB, normal effort, No wheezes, rales or rhonchi Abdomen: Bowel sounds present, nontender, nondistended, no hepatosplenomegaly. Extremities: no edema or cyanosis. Skin: warm and dry, no rashes noted Neuro: alert, no obvious focal deficits Psych: Normal affect and mood  ASSESSMENT/PLAN:   No problem-specific Assessment & Plan notes found for this encounter.     Dr. [name], [place]    {    This will disappear when note is signed, click to select method of visit    :1}

## 2022-06-23 DIAGNOSIS — J45909 Unspecified asthma, uncomplicated: Secondary | ICD-10-CM | POA: Diagnosis not present

## 2022-06-25 ENCOUNTER — Ambulatory Visit: Payer: Medicaid Other | Admitting: Student

## 2022-06-25 VITALS — BP 129/81 | HR 79 | Ht 59.0 in | Wt 226.0 lb

## 2022-06-25 DIAGNOSIS — Z7689 Persons encountering health services in other specified circumstances: Secondary | ICD-10-CM | POA: Diagnosis not present

## 2022-06-25 DIAGNOSIS — N950 Postmenopausal bleeding: Secondary | ICD-10-CM | POA: Diagnosis not present

## 2022-06-25 NOTE — Patient Instructions (Signed)
It was great to see you! Thank you for allowing me to participate in your care!   Our plans for today:  - I will set you up for a colposcopy visit for an endometrial biopsy  - I am going to get an ultrasound for your pelvis as well  Take care and seek immediate care sooner if you develop any concerns.  Gerrit Heck, MD

## 2022-06-25 NOTE — Assessment & Plan Note (Signed)
Patient's story consistent most with postmenopausal bleeding.  No bleeding this month and not symptomatic.  Discussed with patient that we will need to do ultrasound and endometrial biopsy for this and she was amenable. -Complete pelvic ultrasound with transvaginal ultrasound -Colposcopy clinic appointment for endometrial biopsy

## 2022-06-25 NOTE — Progress Notes (Signed)
    SUBJECTIVE:   CHIEF COMPLAINT / HPI: Vaginal bleeding  05/17/2022 Phone note "Patient calls nurse line reporting starting her menstrual period this morning.  She reports her LMP was October 2022.  She reports she woke this morning and the toilet was full of blood. She denies any abdominal cramping.  She reports she has had a pad on ever since and reports minimal bleeding since 8 am.  Patient denies any urinary symptoms. Patient scheduled for 12/28 for evaluation as it has been over one year since her last period.  Patient advised to monitor her bleeding and to call if symptoms worsen.  Patient voiced understanding. "  Patient says after calling nurse line that she had a menstrual period for 5 days.  She said the last time that she had a menstrual period was October 2022.  She also mentions that she ate a whole can of beets the night before.  She denies any red stools or dark stools during that time.  She denied any dysuria during that time.  She has not had any vaginal bleeding this month.  Denies any abdominal pain currently.   PERTINENT  PMH / PSH: Primary biliary cirrhosis, for any, intellectual disability, history of abnormal menstrual periods  OBJECTIVE:   BP 129/81   Pulse 79   Ht '4\' 11"'$  (1.499 m)   Wt 226 lb (102.5 kg)   SpO2 97%   BMI 45.65 kg/m   General: Well appearing, NAD, awake, alert, responsive to questions Head: Normocephalic atraumatic CV: Regular rate and rhythm no murmurs rubs or gallops Respiratory: Clear to ausculation bilaterally, no wheezes rales or crackles, chest rises symmetrically,  no increased work of breathing Abdomen: Soft, non-tender, non-distended, normoactive bowel sounds, no CVA or suprapubic tenderness Extremities: Moves upper and lower extremities freely, no edema in LE Neuro: No focal deficits Skin: No rashes or lesions visualized   ASSESSMENT/PLAN:   Postmenopausal bleeding Patient's story consistent most with postmenopausal  bleeding.  No bleeding this month and not symptomatic.  Discussed with patient that we will need to do ultrasound and endometrial biopsy for this and she was amenable. -Complete pelvic ultrasound with transvaginal ultrasound -Colposcopy clinic appointment for endometrial biopsy   Gerrit Heck, MD [place]

## 2022-06-26 DIAGNOSIS — J45909 Unspecified asthma, uncomplicated: Secondary | ICD-10-CM | POA: Diagnosis not present

## 2022-06-28 ENCOUNTER — Other Ambulatory Visit: Payer: Medicaid Other | Admitting: *Deleted

## 2022-06-28 ENCOUNTER — Ambulatory Visit (HOSPITAL_COMMUNITY): Payer: Medicaid Other

## 2022-06-29 DIAGNOSIS — Z944 Liver transplant status: Secondary | ICD-10-CM | POA: Diagnosis not present

## 2022-06-29 DIAGNOSIS — Z79899 Other long term (current) drug therapy: Secondary | ICD-10-CM | POA: Diagnosis not present

## 2022-06-29 DIAGNOSIS — Z7951 Long term (current) use of inhaled steroids: Secondary | ICD-10-CM | POA: Diagnosis not present

## 2022-06-29 DIAGNOSIS — Z9181 History of falling: Secondary | ICD-10-CM | POA: Diagnosis not present

## 2022-06-29 DIAGNOSIS — F259 Schizoaffective disorder, unspecified: Secondary | ICD-10-CM | POA: Diagnosis not present

## 2022-07-03 ENCOUNTER — Ambulatory Visit (HOSPITAL_COMMUNITY)
Admission: RE | Admit: 2022-07-03 | Discharge: 2022-07-03 | Disposition: A | Discharge status: Home / Self Care | Payer: Medicaid Other | Source: Ambulatory Visit | Attending: Family Medicine | Admitting: Family Medicine

## 2022-07-03 DIAGNOSIS — R9389 Abnormal findings on diagnostic imaging of other specified body structures: Secondary | ICD-10-CM | POA: Diagnosis not present

## 2022-07-03 DIAGNOSIS — J45909 Unspecified asthma, uncomplicated: Secondary | ICD-10-CM | POA: Diagnosis not present

## 2022-07-03 DIAGNOSIS — N8500 Endometrial hyperplasia, unspecified: Secondary | ICD-10-CM | POA: Diagnosis not present

## 2022-07-03 DIAGNOSIS — N950 Postmenopausal bleeding: Secondary | ICD-10-CM | POA: Diagnosis not present

## 2022-07-04 DIAGNOSIS — Z944 Liver transplant status: Secondary | ICD-10-CM | POA: Diagnosis not present

## 2022-07-04 DIAGNOSIS — Z9181 History of falling: Secondary | ICD-10-CM | POA: Diagnosis not present

## 2022-07-04 DIAGNOSIS — Z79899 Other long term (current) drug therapy: Secondary | ICD-10-CM | POA: Diagnosis not present

## 2022-07-04 DIAGNOSIS — F259 Schizoaffective disorder, unspecified: Secondary | ICD-10-CM | POA: Diagnosis not present

## 2022-07-04 DIAGNOSIS — Z7951 Long term (current) use of inhaled steroids: Secondary | ICD-10-CM | POA: Diagnosis not present

## 2022-07-05 ENCOUNTER — Ambulatory Visit: Payer: Medicaid Other

## 2022-07-06 ENCOUNTER — Ambulatory Visit: Payer: Medicaid Other

## 2022-07-06 DIAGNOSIS — J45909 Unspecified asthma, uncomplicated: Secondary | ICD-10-CM | POA: Diagnosis not present

## 2022-07-06 DIAGNOSIS — F250 Schizoaffective disorder, bipolar type: Secondary | ICD-10-CM | POA: Diagnosis not present

## 2022-07-09 DIAGNOSIS — J45909 Unspecified asthma, uncomplicated: Secondary | ICD-10-CM | POA: Diagnosis not present

## 2022-07-10 DIAGNOSIS — J45909 Unspecified asthma, uncomplicated: Secondary | ICD-10-CM | POA: Diagnosis not present

## 2022-07-11 ENCOUNTER — Other Ambulatory Visit: Payer: Medicaid Other | Admitting: *Deleted

## 2022-07-11 DIAGNOSIS — Z944 Liver transplant status: Secondary | ICD-10-CM | POA: Diagnosis not present

## 2022-07-11 DIAGNOSIS — Z9181 History of falling: Secondary | ICD-10-CM | POA: Diagnosis not present

## 2022-07-11 DIAGNOSIS — F259 Schizoaffective disorder, unspecified: Secondary | ICD-10-CM | POA: Diagnosis not present

## 2022-07-11 DIAGNOSIS — Z79899 Other long term (current) drug therapy: Secondary | ICD-10-CM | POA: Diagnosis not present

## 2022-07-11 DIAGNOSIS — J45909 Unspecified asthma, uncomplicated: Secondary | ICD-10-CM | POA: Diagnosis not present

## 2022-07-11 DIAGNOSIS — Z7951 Long term (current) use of inhaled steroids: Secondary | ICD-10-CM | POA: Diagnosis not present

## 2022-07-12 DIAGNOSIS — J45909 Unspecified asthma, uncomplicated: Secondary | ICD-10-CM | POA: Diagnosis not present

## 2022-07-17 DIAGNOSIS — J45909 Unspecified asthma, uncomplicated: Secondary | ICD-10-CM | POA: Diagnosis not present

## 2022-07-18 DIAGNOSIS — F259 Schizoaffective disorder, unspecified: Secondary | ICD-10-CM | POA: Diagnosis not present

## 2022-07-19 ENCOUNTER — Other Ambulatory Visit: Payer: Self-pay | Admitting: Student

## 2022-07-19 ENCOUNTER — Other Ambulatory Visit: Payer: Self-pay | Admitting: Allergy and Immunology

## 2022-07-19 DIAGNOSIS — J45909 Unspecified asthma, uncomplicated: Secondary | ICD-10-CM | POA: Diagnosis not present

## 2022-07-19 DIAGNOSIS — E038 Other specified hypothyroidism: Secondary | ICD-10-CM

## 2022-07-20 DIAGNOSIS — J45909 Unspecified asthma, uncomplicated: Secondary | ICD-10-CM | POA: Diagnosis not present

## 2022-07-21 DIAGNOSIS — J45909 Unspecified asthma, uncomplicated: Secondary | ICD-10-CM | POA: Diagnosis not present

## 2022-07-22 DIAGNOSIS — J45909 Unspecified asthma, uncomplicated: Secondary | ICD-10-CM | POA: Diagnosis not present

## 2022-07-23 ENCOUNTER — Encounter: Payer: Self-pay | Admitting: Family Medicine

## 2022-07-23 DIAGNOSIS — J45909 Unspecified asthma, uncomplicated: Secondary | ICD-10-CM | POA: Diagnosis not present

## 2022-07-23 NOTE — Progress Notes (Signed)
[name] was seen for a weight check today. Goals established in Nutrition Clinic on 06/14/22 were (1) Eat 6 servings of veg's per day; and (2) Limit starchy foods to 2 per meal.  Weight today: 232.0 lb (227.2 lb on 06/14/22; ht is 59").   24-hr recall:  (Up at 4 AM; 1 cup apple juice with med's) B (8 AM)-  3 small pancakes; 1 tbsp pancake syrup; 3 scrambled eggs, cheese Snk ( AM)-  --- L ( PM)-  2 c hot tea Snk ( PM)-  --- D (4 PM)-  2 c roma tomatoes, 1 c cranberry juice Snk (9 PM)-  1 tuna (5 oz) salad sandwich Snk (10 PM)-  10 oz water Snk (12 AM)-  1 c roma tomatoes Typical day? No.  Usually has 3 meals a day; aide is supposed to make breakfast and dinner for [name], but did not make dinner yesterday.    Recommendations, 07/23/22: Goals: Eat 3 REAL meals each day, breakfast, lunch, and dinner. For breakfast a real meal includes: a protein food and a carb food.  (Fruit is optional.) Lunch and dinner both need to include a protein food, a carb food, and some vegetables. For every carb portion and every vegetable portion you eat, place one check mark on your Goals Sheet.   Remember  that all juices and sweet drinks count as carb's:  cup (4 oz) of a sweet drink = 1 carb portion.   Include vegetables at both lunch and dinner.    Recommendations from January 18 appt in Nutrition Clinic:  Every day, eat 3 nutritionally balanced meals: breakfast, lunch, and dinner ? Breakfast: protein, carbohydrate. Fruit is optional.  ? Lunch and dinner: 2 Vegetables, 2 carbohydrates, 1 protein Use fats and oils in moderation   Roast, boil, or bake instead of fry foods Measure all the oil (such as salad dressing) you use Drink PLENTY of plain water through the day Limit juices and any sweet drinks Limit highly processed foods ? Highly processed foods = most things that come in a plastic wrapper American cheese "Instant" foods Pre-made frozen meals Processed meats such as sausage and bacon Nuts: calories and  good fats from nuts add up in a hurry! If you eat nuts, aim for no more than 1/4 cup of nuts at a time  Follow-up: Wt check on Monday, April 8 at 2:30 PM.

## 2022-07-24 ENCOUNTER — Ambulatory Visit: Payer: Medicaid Other | Admitting: Podiatry

## 2022-07-25 ENCOUNTER — Other Ambulatory Visit: Payer: Self-pay | Admitting: Allergy and Immunology

## 2022-07-25 DIAGNOSIS — J45909 Unspecified asthma, uncomplicated: Secondary | ICD-10-CM | POA: Diagnosis not present

## 2022-07-25 DIAGNOSIS — F259 Schizoaffective disorder, unspecified: Secondary | ICD-10-CM | POA: Diagnosis not present

## 2022-07-25 DIAGNOSIS — Z944 Liver transplant status: Secondary | ICD-10-CM | POA: Diagnosis not present

## 2022-07-26 ENCOUNTER — Ambulatory Visit: Payer: Medicaid Other

## 2022-07-26 ENCOUNTER — Ambulatory Visit: Payer: Medicaid Other | Admitting: Family Medicine

## 2022-07-26 DIAGNOSIS — J45909 Unspecified asthma, uncomplicated: Secondary | ICD-10-CM | POA: Diagnosis not present

## 2022-07-30 ENCOUNTER — Other Ambulatory Visit: Payer: Medicaid Other | Admitting: *Deleted

## 2022-07-30 DIAGNOSIS — J45909 Unspecified asthma, uncomplicated: Secondary | ICD-10-CM | POA: Diagnosis not present

## 2022-07-31 DIAGNOSIS — J45909 Unspecified asthma, uncomplicated: Secondary | ICD-10-CM | POA: Diagnosis not present

## 2022-08-01 DIAGNOSIS — Z9181 History of falling: Secondary | ICD-10-CM | POA: Diagnosis not present

## 2022-08-01 DIAGNOSIS — F259 Schizoaffective disorder, unspecified: Secondary | ICD-10-CM | POA: Diagnosis not present

## 2022-08-01 DIAGNOSIS — Z7951 Long term (current) use of inhaled steroids: Secondary | ICD-10-CM | POA: Diagnosis not present

## 2022-08-01 DIAGNOSIS — Z944 Liver transplant status: Secondary | ICD-10-CM | POA: Diagnosis not present

## 2022-08-01 DIAGNOSIS — Z79899 Other long term (current) drug therapy: Secondary | ICD-10-CM | POA: Diagnosis not present

## 2022-08-01 DIAGNOSIS — J45909 Unspecified asthma, uncomplicated: Secondary | ICD-10-CM | POA: Diagnosis not present

## 2022-08-03 DIAGNOSIS — J45909 Unspecified asthma, uncomplicated: Secondary | ICD-10-CM | POA: Diagnosis not present

## 2022-08-04 DIAGNOSIS — J45909 Unspecified asthma, uncomplicated: Secondary | ICD-10-CM | POA: Diagnosis not present

## 2022-08-05 DIAGNOSIS — J45909 Unspecified asthma, uncomplicated: Secondary | ICD-10-CM | POA: Diagnosis not present

## 2022-08-06 ENCOUNTER — Other Ambulatory Visit: Payer: Medicaid Other | Admitting: *Deleted

## 2022-08-06 DIAGNOSIS — J45909 Unspecified asthma, uncomplicated: Secondary | ICD-10-CM | POA: Diagnosis not present

## 2022-08-07 DIAGNOSIS — J45909 Unspecified asthma, uncomplicated: Secondary | ICD-10-CM | POA: Diagnosis not present

## 2022-08-08 DIAGNOSIS — Z79899 Other long term (current) drug therapy: Secondary | ICD-10-CM | POA: Diagnosis not present

## 2022-08-08 DIAGNOSIS — J45909 Unspecified asthma, uncomplicated: Secondary | ICD-10-CM | POA: Diagnosis not present

## 2022-08-08 DIAGNOSIS — F259 Schizoaffective disorder, unspecified: Secondary | ICD-10-CM | POA: Diagnosis not present

## 2022-08-08 DIAGNOSIS — Z9181 History of falling: Secondary | ICD-10-CM | POA: Diagnosis not present

## 2022-08-08 DIAGNOSIS — Z7951 Long term (current) use of inhaled steroids: Secondary | ICD-10-CM | POA: Diagnosis not present

## 2022-08-08 DIAGNOSIS — Z944 Liver transplant status: Secondary | ICD-10-CM | POA: Diagnosis not present

## 2022-08-09 DIAGNOSIS — J45909 Unspecified asthma, uncomplicated: Secondary | ICD-10-CM | POA: Diagnosis not present

## 2022-08-10 DIAGNOSIS — J45909 Unspecified asthma, uncomplicated: Secondary | ICD-10-CM | POA: Diagnosis not present

## 2022-08-11 DIAGNOSIS — J45909 Unspecified asthma, uncomplicated: Secondary | ICD-10-CM | POA: Diagnosis not present

## 2022-08-13 DIAGNOSIS — J45909 Unspecified asthma, uncomplicated: Secondary | ICD-10-CM | POA: Diagnosis not present

## 2022-08-14 ENCOUNTER — Ambulatory Visit (INDEPENDENT_AMBULATORY_CARE_PROVIDER_SITE_OTHER): Payer: Medicaid Other | Admitting: Allergy and Immunology

## 2022-08-14 ENCOUNTER — Other Ambulatory Visit: Payer: Self-pay | Admitting: Allergy and Immunology

## 2022-08-14 VITALS — BP 130/86 | HR 100 | Temp 97.8°F | Resp 18 | Ht 60.0 in | Wt 228.4 lb

## 2022-08-14 DIAGNOSIS — K219 Gastro-esophageal reflux disease without esophagitis: Secondary | ICD-10-CM | POA: Diagnosis not present

## 2022-08-14 DIAGNOSIS — L989 Disorder of the skin and subcutaneous tissue, unspecified: Secondary | ICD-10-CM

## 2022-08-14 DIAGNOSIS — J3089 Other allergic rhinitis: Secondary | ICD-10-CM | POA: Diagnosis not present

## 2022-08-14 DIAGNOSIS — J4540 Moderate persistent asthma, uncomplicated: Secondary | ICD-10-CM | POA: Diagnosis not present

## 2022-08-14 DIAGNOSIS — J301 Allergic rhinitis due to pollen: Secondary | ICD-10-CM | POA: Diagnosis not present

## 2022-08-14 DIAGNOSIS — J45909 Unspecified asthma, uncomplicated: Secondary | ICD-10-CM | POA: Diagnosis not present

## 2022-08-14 DIAGNOSIS — Z7689 Persons encountering health services in other specified circumstances: Secondary | ICD-10-CM | POA: Diagnosis not present

## 2022-08-14 MED ORDER — CETIRIZINE HCL 10 MG PO TABS: 10.0000 mg | ORAL_TABLET | Freq: Every day | ORAL | 1 refills | Status: DC | PRN

## 2022-08-14 MED ORDER — MOMETASONE FUROATE 0.1 % EX CREA: TOPICAL_CREAM | Freq: Two times a day (BID) | CUTANEOUS | 1 refills | Status: DC | PRN

## 2022-08-14 MED ORDER — FLUTICASONE PROPIONATE 50 MCG/ACT NA SUSP: 1.0000 | Freq: Two times a day (BID) | NASAL | 1 refills | Status: DC

## 2022-08-14 MED ORDER — FLUTICASONE PROPIONATE HFA 110 MCG/ACT IN AERO: 2.0000 | INHALATION_SPRAY | Freq: Two times a day (BID) | RESPIRATORY_TRACT | 5 refills | Status: DC

## 2022-08-14 MED ORDER — BUDESONIDE 0.5 MG/2ML IN SUSP: 0.5000 mg | RESPIRATORY_TRACT | 1 refills | Status: DC | PRN

## 2022-08-14 MED ORDER — OMEPRAZOLE 40 MG PO CPDR: 40.0000 mg | DELAYED_RELEASE_CAPSULE | Freq: Two times a day (BID) | ORAL | 5 refills | Status: DC

## 2022-08-14 MED ORDER — NEBULIZERS MISC: 1.0000 | 1 refills | Status: AC

## 2022-08-14 MED ORDER — SPIRIVA RESPIMAT 1.25 MCG/ACT IN AERS: 2.0000 | INHALATION_SPRAY | Freq: Every morning | RESPIRATORY_TRACT | 1 refills | Status: DC

## 2022-08-14 MED ORDER — ALBUTEROL SULFATE (2.5 MG/3ML) 0.083% IN NEBU: 2.5000 mg | INHALATION_SOLUTION | RESPIRATORY_TRACT | 1 refills | Status: DC | PRN

## 2022-08-14 MED ORDER — SPACER/AERO-HOLD CHAMBER MASK MISC: 1 refills | Status: AC

## 2022-08-14 MED ORDER — BUDESONIDE-FORMOTEROL FUMARATE 160-4.5 MCG/ACT IN AERO: 2.0000 | INHALATION_SPRAY | Freq: Two times a day (BID) | RESPIRATORY_TRACT | 1 refills | Status: DC

## 2022-08-14 MED ORDER — AZELASTINE HCL 0.1 % NA SOLN: 2.0000 | Freq: Two times a day (BID) | NASAL | 1 refills | Status: DC

## 2022-08-14 MED ORDER — MONTELUKAST SODIUM 10 MG PO TABS: 10.0000 mg | ORAL_TABLET | Freq: Every evening | ORAL | 1 refills | Status: DC

## 2022-08-14 MED ORDER — PANTOPRAZOLE SODIUM 40 MG PO TBEC: 40.0000 mg | DELAYED_RELEASE_TABLET | Freq: Two times a day (BID) | ORAL | 1 refills | Status: DC

## 2022-08-14 MED ORDER — FAMOTIDINE 40 MG PO TABS: 40.0000 mg | ORAL_TABLET | Freq: Two times a day (BID) | ORAL | 1 refills | Status: DC

## 2022-08-14 MED ORDER — VENTOLIN HFA 108 (90 BASE) MCG/ACT IN AERS: 2.0000 | INHALATION_SPRAY | RESPIRATORY_TRACT | 1 refills | Status: DC | PRN

## 2022-08-14 NOTE — Patient Instructions (Addendum)
  1.  Continue Symbicort 160 2 inhalations 2 times per day   2.  Continue Spiriva 1.25 Respimat - 2 inhalations 1 time per day  3.  Continue montelukast 10 mg one tablet once a day  4. Continue nasal Azelastine and fluticasone 1 spray each nostril twice a day  5. Continue cetirizine 10 mg one tablet once a day  6. Continue Pantoprazole / Omeprazole 40 mg 2 times per day + famotidine 40 mg  7. If needed:  A. Albuterol HFA 2 inhalations or albuterol nebulizer every 4-6 hours  B. Mometasone 0.1% cream 1-2 times a day if needed   8. "Action Plan" for increased asthma activity:   A. Start Fluticasone 110 - 2 inhalations 2 times per day  B. Continue Symbicort and Spiriva  C. Use albuterol if needed  9. Return to clinic in 6 months or earlier if problem

## 2022-08-14 NOTE — Progress Notes (Signed)
[place]   Follow-up Note  Referring Provider: [name], DO Primary Provider: [name], DO Date of Office Visit: 08/14/2022  Subjective:   [name] (DOB: 28-Aug-1975) is a 47 y.o. female who returns to the [facility] [place] on 08/14/2022 in re-evaluation of the following:  HPI: [name] returns to this clinic in reevaluation of asthma, allergic rhinoconjunctivitis, inflammatory dermatosis, LPR, and immunosuppression with CellCept and cyclosporine for liver transplant.  I last saw her in this clinic 18 July 2021.  She has really done well since her last visit regarding her asthma and rarely uses a short acting bronchodilator and likewise has had very good control of her nasal issue and has not required an antibiotic to treat an episode of sinusitis while she continues on a collection of anti-inflammatory agents for both her upper and lower airway.  However, she has run out of her cetirizine and her montelukast for the past 3 weeks and she has developed some problems with runny nose and a little bit of cough.  She believes that her reflux is under very good control.  This is being handled by her GI team.  It sounds as though she uses either pantoprazole or omeprazole twice a day and will add in famotidine.  Her hand dermatitis is under good control with the use of mometasone once or twice a week.  She has obtained the flu vaccine and the COVID-vaccine fall 2023.  Allergies as of 08/14/2022       Reactions   Tramadol Hcl Itching, Nausea And Vomiting   Causes SEIZURES   Aspirin Nausea And Vomiting   Due to Stomach ulcer   Ibuprofen Other (See Comments)   Due to stomach ulcer   Penicillins Itching, Swelling, Rash   Breakout  Has patient had a PCN reaction causing immediate rash, facial/tongue/throat swelling, SOB or lightheadedness with hypotension: Yes Has patient had a PCN reaction causing severe rash  involving mucus membranes or skin necrosis: Yes Has patient had a PCN reaction that required hospitalization: Yes Has patient had a PCN reaction occurring within the last 10 years: No If all of the above answers are "NO", then may proceed with Cephalosporin use.     Pseudoephedrine Itching, Swelling   Tongue swelling   Sulfa Antibiotics Nausea And Vomiting   Acetaminophen    Other reaction(s): elevated LFT's   Latex Itching, Rash        Medication List    Accu-Chek Guide test strip Generic drug: glucose blood USE TO CHECK BLOOD SUGAR EVERY DAY   Accu-Chek Guide w/Device Kit 1 each by Does not apply route daily. Use to check blood sugar once daily or as directed.   Accu-Chek Softclix Lancets lancets Use to check blood sugar once daily in the morning prior to eating.   ammonium lactate 12 % cream Commonly known as: AMLACTIN Apply topically 2 (two) times daily.   azelastine 0.1 % nasal spray Commonly known as: ASTELIN Place 2 sprays into both nostrils 2 (two) times daily.   benztropine 0.5 MG tablet Commonly known as: COGENTIN Take 0.5 mg by mouth at bedtime.   Blood Pressure Monitor Automat Devi Please use to check blood pressure daily/ as needed.   budesonide 0.5 MG/2ML nebulizer solution Commonly known as: PULMICORT Take 2 mLs (0.5 mg total) by nebulization every 4 (four) hours as needed.   budesonide-formoterol 160-4.5 MCG/ACT inhaler Commonly known as: SYMBICORT Inhale 2 puffs into the lungs  2 (two) times daily. Use with spacer and mask.   carboxymethylcellulose 0.5 % Soln Commonly known as: REFRESH PLUS Place 1 drop into both eyes 4 (four) times daily as needed (for dry eyes).   cetirizine 10 MG tablet Commonly known as: ZYRTEC Take 1 tablet (10 mg total) by mouth 2 (two) times daily as needed for allergies (Can use an extra dose during flares).   cycloSPORINE modified 100 MG Caps Take 100-125 mg by mouth 2 (two) times daily. Take 100 mg in the AM and  125 mg in the PM   divalproex 500 MG 24 hr tablet Commonly known as: DEPAKOTE ER Take 2 tablets (1,000 mg total) by mouth at bedtime.   doxepin 25 MG capsule Commonly known as: SINEQUAN TK 1 C PO HS   famotidine 40 MG tablet Commonly known as: PEPCID TAKE 1 TABLET(40 MG) BY MOUTH TWICE DAILY   ferrous sulfate 325 (65 FE) MG EC tablet Take 1 tablet by mouth daily.   FIBER (GUAR GUM) PO Take by mouth.   fluticasone 50 MCG/ACT nasal spray Commonly known as: FLONASE Place 1 spray into both nostrils in the morning and at bedtime.   levothyroxine 75 MCG tablet Commonly known as: SYNTHROID TAKE 1 TABLET(75 MCG) BY MOUTH EVERY MORNING 30 MINUTES BEFORE FOOD   MAGnesium-Oxide 400 (241.3 Mg) MG tablet Generic drug: magnesium oxide Take 1 tablet by mouth 2 (two) times daily.   mometasone 0.1 % cream Commonly known as: ELOCON Apply topically 2 (two) times daily as needed. Can use on skin one to two times daily if needed.   montelukast 10 MG tablet Commonly known as: SINGULAIR Take 1 tablet (10 mg total) by mouth at bedtime.   multivitamin tablet Take 1 tablet by mouth daily. 9 am.   mycophenolate 250 MG capsule Commonly known as: CELLCEPT Take 1 capsule (250 mg total) by mouth 2 (two) times daily. One tablet daily   Nebulizer/Tubing/Mouthpiece Kit 2 each by Does not apply route as directed.   Nebulizers Misc 1 kit by Does not apply route as directed.   omeprazole 40 MG capsule Commonly known as: PRILOSEC Take 1 capsule (40 mg total) by mouth 2 (two) times daily.   pantoprazole 40 MG tablet Commonly known as: PROTONIX Take 1 tablet (40 mg total) by mouth 2 (two) times daily.   polyethylene glycol 17 g packet Commonly known as: MIRALAX / GLYCOLAX Take 17 g by mouth daily as needed (for constipation). In water or juice   prazosin 1 MG capsule Commonly known as: MINIPRESS Take 2 mg by mouth at bedtime.   ProAir HFA 108 (90 Base) MCG/ACT inhaler Generic drug:  albuterol Inhale 1-2 puffs into the lungs every 4 (four) hours as needed for shortness of breath.   Ventolin HFA 108 (90 Base) MCG/ACT inhaler Generic drug: albuterol Inhale 2 puffs into the lungs every 4 (four) hours as needed for wheezing or shortness of breath.   albuterol (2.5 MG/3ML) 0.083% nebulizer solution Commonly known as: PROVENTIL Take 3 mLs (2.5 mg total) by nebulization every 4 (four) hours as needed for wheezing or shortness of breath. 1 vial via nebulizer every 4-6 hours as needed.   RA Vitamin D-3 50 MCG (2000 UT) Caps Generic drug: Cholecalciferol Take 1 capsule by mouth daily.   risperiDONE 3 MG tablet Commonly known as: RISPERDAL Take 3 mg by mouth at bedtime.   Spacer/Aero-Hold [employer] Use as directed with inhaler.   Spiriva Respimat 1.25 MCG/ACT Aers Generic drug:  Tiotropium Bromide Monohydrate Inhale 2 puffs into the lungs daily.   .  Past Medical History:  Diagnosis Date   Abnormal vaginal bleeding    Allergy    Anemia    Arthritis    right knee   Asthma 09-23-12   Asthma somewhat causing increase wheezing and mild congestion at present   Biliary cirrhosis ([place]) 04/26/11   Constipation    DUB (dysfunctional uterine bleeding)    Fall at home, initial encounter 01/21/2019   GERD (gastroesophageal reflux disease)    Health care maintenance 03/06/2014   Intellectual disability    Left-sided Bell's palsy 12/22/2018   Liver transplant recipient [facility])    Lumbar back pain 04/06/2014   PONV (postoperative nausea and vomiting)    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 seizures   Vitamin D deficiency     Past Surgical History:  Procedure Laterality Date   adenonoidectomy     APPENDECTOMY     CHOLECYSTECTOMY     COLONOSCOPY W/ BIOPSIES     ESOPHAGOGASTRODUODENOSCOPY (EGD) WITH PROPOFOL N/A 10/01/2012   Procedure:  ESOPHAGOGASTRODUODENOSCOPY (EGD) WITH PROPOFOL;  Surgeon: Arta Silence, MD;  Location: [facility];  Service: Endoscopy;  Laterality: N/A;   eustacean tubes     EYE MUSCLE SURGERY     LIVER TRANSPLANT      Review of systems negative except as noted in HPI / PMHx or noted below:  Review of Systems  Constitutional: Negative.   HENT: Negative.    Eyes: Negative.   Respiratory: Negative.    Cardiovascular: Negative.   Gastrointestinal: Negative.   Genitourinary: Negative.   Musculoskeletal: Negative.   Skin: Negative.   Neurological: Negative.   Endo/Heme/Allergies: Negative.   Psychiatric/Behavioral: Negative.       Objective:   Vitals:   08/14/22 1334  BP: 130/86  Pulse: 100  Resp: 18  Temp: 97.8 F (36.6 C)  SpO2: 98%   Height: 5' (152.4 cm)  Weight: 228 lb 6.4 oz (103.6 kg)   Physical Exam Constitutional:      Appearance: She is not diaphoretic.  HENT:     Head: Normocephalic.     Right Ear: Tympanic membrane, ear canal and external ear normal.     Left Ear: Tympanic membrane, ear canal and external ear normal.     Nose: Nose normal. No mucosal edema or rhinorrhea.     Mouth/Throat:     Pharynx: Uvula midline. No oropharyngeal exudate.  Eyes:     Conjunctiva/sclera: Conjunctivae normal.  Neck:     Thyroid: No thyromegaly.     Trachea: Trachea normal. No tracheal tenderness or tracheal deviation.  Cardiovascular:     Rate and Rhythm: Normal rate and regular rhythm.     Heart sounds: Normal heart sounds, S1 normal and S2 normal. No murmur heard. Pulmonary:     Effort: No respiratory distress.     Breath sounds: Normal breath sounds. No stridor. No wheezing or rales.  Lymphadenopathy:     Head:     Right side of head: No tonsillar adenopathy.     Left side of head: No tonsillar adenopathy.     Cervical: No cervical adenopathy.  Skin:    Findings: No erythema or rash.     Nails: There is no clubbing.  Neurological:     Mental Status: She is alert.      Diagnostics:    Spirometry  was performed and demonstrated an FEV1 of 1.75 at 83 % of predicted.  Results of blood tests obtained 18 April 2022 identifies WBC 3.4, absolute eosinophil 200, absolute lymphocyte 1300, hemoglobin 10.4, platelet 113.  Assessment and Plan:   1. Asthma, moderate persistent, well-controlled   2. Perennial allergic rhinitis   3. Seasonal allergic rhinitis due to pollen   4. Inflammatory dermatosis   5. LPRD (laryngopharyngeal reflux disease)    1.  Continue Symbicort 160 2 inhalations 2 times per day   2.  Continue Spiriva 1.25 Respimat - 2 inhalations 1 time per day  3.  Continue montelukast 10 mg one tablet once a day  4. Continue nasal Azelastine and fluticasone 1 spray each nostril twice a day  5. Continue cetirizine 10 mg one tablet once a day  6. Continue Pantoprazole / Omeprazole 40 mg 2 times per day + famotidine 40 mg  7. If needed:  A. Albuterol HFA 2 inhalations or albuterol nebulizer every 4-6 hours  B. Mometasone 0.1% cream 1-2 times a day if needed   8. "Action Plan" for increased asthma activity:   A. Start Fluticasone 110 - 2 inhalations 2 times per day  B. Continue Symbicort and Spiriva  C. Use albuterol if needed  9. Return to clinic in 6 months or earlier if problem  Sibel appears to be doing quite well and I have refilled her medications that had run out in the past 3 weeks I suspect that she will have some improvement regarding her runny nose and cough that has developed during this timeframe and she will continue to utilize a collection of anti-inflammatory agents for airway and also continue to treat her reflux aggressively as noted above and assuming she does well I will see her back in this clinic in 6 months or earlier if there is a problem   Allena Katz, MD [facility] / [place]

## 2022-08-15 ENCOUNTER — Encounter: Payer: Self-pay | Admitting: Allergy and Immunology

## 2022-08-15 DIAGNOSIS — J45909 Unspecified asthma, uncomplicated: Secondary | ICD-10-CM | POA: Diagnosis not present

## 2022-08-15 DIAGNOSIS — Z7951 Long term (current) use of inhaled steroids: Secondary | ICD-10-CM | POA: Diagnosis not present

## 2022-08-15 DIAGNOSIS — F259 Schizoaffective disorder, unspecified: Secondary | ICD-10-CM | POA: Diagnosis not present

## 2022-08-15 DIAGNOSIS — Z9181 History of falling: Secondary | ICD-10-CM | POA: Diagnosis not present

## 2022-08-15 DIAGNOSIS — Z944 Liver transplant status: Secondary | ICD-10-CM | POA: Diagnosis not present

## 2022-08-15 DIAGNOSIS — Z79899 Other long term (current) drug therapy: Secondary | ICD-10-CM | POA: Diagnosis not present

## 2022-08-17 DIAGNOSIS — J45909 Unspecified asthma, uncomplicated: Secondary | ICD-10-CM | POA: Diagnosis not present

## 2022-08-18 DIAGNOSIS — J45909 Unspecified asthma, uncomplicated: Secondary | ICD-10-CM | POA: Diagnosis not present

## 2022-08-20 ENCOUNTER — Ambulatory Visit: Payer: Medicaid Other | Admitting: Student

## 2022-08-20 ENCOUNTER — Encounter: Payer: Self-pay | Admitting: Student

## 2022-08-20 VITALS — BP 131/80 | HR 79 | Ht 60.0 in | Wt 228.6 lb

## 2022-08-20 DIAGNOSIS — N950 Postmenopausal bleeding: Secondary | ICD-10-CM | POA: Diagnosis not present

## 2022-08-20 DIAGNOSIS — Z7689 Persons encountering health services in other specified circumstances: Secondary | ICD-10-CM | POA: Diagnosis not present

## 2022-08-20 DIAGNOSIS — M25561 Pain in right knee: Secondary | ICD-10-CM

## 2022-08-20 NOTE — Assessment & Plan Note (Signed)
US showed thickened endometrium at 6 mm.  There is concern for endometrial cancer.  Will proceed with endometrial biopsy.  Patient is scheduled in [place] clinic next month

## 2022-08-20 NOTE — Patient Instructions (Addendum)
It was great to see you! Thank you for allowing me to participate in your care!   Our plans for today:  - Make an appointment for a uterine biopsy within the next month - Return in the next month to discuss knee pain if needed, otherwise return in 4-6 months to check in   Take care and seek immediate care sooner if you develop any concerns.   Dr. [name], DO [facility]

## 2022-08-20 NOTE — Progress Notes (Cosign Needed Addendum)
    SUBJECTIVE:   CHIEF COMPLAINT / HPI:   Postmenopausal bleeding Patient was seen in January due to postmenopausal bleeding she had in December.  No more vaginal bleeding since. Had US done 07/03/2022 which showed thickened endocmetrium at 9mm.  She was scheduled for colposcopy clinic for an endometrial biopsy but this appointment was canceled.  Patient follows with multiple specialists including neurology, podiatry, psychiatry and transplant team all of which prescribe medications.  Today patient brings her medications with her so that we can update them in the computer.    PERTINENT  PMH / PSH: Liver transplant recipient, intellectual disability  OBJECTIVE:   BP 131/80   Pulse 79   Ht 5' (1.524 m)   Wt 228 lb 9.6 oz (103.7 kg)   SpO2 99%   BMI 44.65 kg/m    General: NAD, pleasant, able to participate in exam Cardiac: Well-perfused Respiratory: Breathing comfortably on room air Skin: warm and dry Neuro: alert, no obvious focal deficits Psych: Normal affect and mood  ASSESSMENT/PLAN:   Postmenopausal bleeding US showed thickened endometrium at 6 mm.  There is concern for endometrial cancer.  Will proceed with endometrial biopsy.  Patient is scheduled in colpo clinic next month  Right knee pain At the end of the visit patient expressed she has been having some R knee pain.  Unsure if this related to the knee pain she was having in December after a fall.  Patient expressed she was not currently having the pain during this visit which is reassuring.  She was advised to make a follow-up appointment to further discuss this pain if needed.   All medications have been updated in the computer as prescribed.  Patient expressed good understanding of what each medication was for and how to take it.    Dr. [name], [place]

## 2022-08-20 NOTE — Assessment & Plan Note (Signed)
At the end of the visit patient expressed she has been having some R knee pain.  Unsure if this related to the knee pain she was having in December after a fall.  Patient expressed she was not currently having the pain during this visit which is reassuring.  She was advised to make a follow-up appointment to further discuss this pain if needed.

## 2022-08-21 DIAGNOSIS — Z79899 Other long term (current) drug therapy: Secondary | ICD-10-CM | POA: Diagnosis not present

## 2022-08-21 DIAGNOSIS — Z944 Liver transplant status: Secondary | ICD-10-CM | POA: Diagnosis not present

## 2022-08-21 DIAGNOSIS — F259 Schizoaffective disorder, unspecified: Secondary | ICD-10-CM | POA: Diagnosis not present

## 2022-08-21 DIAGNOSIS — Z9181 History of falling: Secondary | ICD-10-CM | POA: Diagnosis not present

## 2022-08-21 DIAGNOSIS — Z7951 Long term (current) use of inhaled steroids: Secondary | ICD-10-CM | POA: Diagnosis not present

## 2022-08-28 ENCOUNTER — Other Ambulatory Visit: Payer: Medicaid Other | Admitting: *Deleted

## 2022-08-28 DIAGNOSIS — J45909 Unspecified asthma, uncomplicated: Secondary | ICD-10-CM | POA: Diagnosis not present

## 2022-08-29 ENCOUNTER — Ambulatory Visit: Payer: Medicaid Other | Admitting: Podiatry

## 2022-08-29 DIAGNOSIS — J45909 Unspecified asthma, uncomplicated: Secondary | ICD-10-CM | POA: Diagnosis not present

## 2022-08-30 DIAGNOSIS — Z944 Liver transplant status: Secondary | ICD-10-CM | POA: Diagnosis not present

## 2022-08-30 DIAGNOSIS — Z9181 History of falling: Secondary | ICD-10-CM | POA: Diagnosis not present

## 2022-08-30 DIAGNOSIS — F259 Schizoaffective disorder, unspecified: Secondary | ICD-10-CM | POA: Diagnosis not present

## 2022-08-30 DIAGNOSIS — J45909 Unspecified asthma, uncomplicated: Secondary | ICD-10-CM | POA: Diagnosis not present

## 2022-08-30 DIAGNOSIS — Z7951 Long term (current) use of inhaled steroids: Secondary | ICD-10-CM | POA: Diagnosis not present

## 2022-09-03 ENCOUNTER — Ambulatory Visit: Payer: Medicaid Other | Admitting: Family Medicine

## 2022-09-03 ENCOUNTER — Ambulatory Visit: Payer: Medicaid Other | Admitting: Podiatry

## 2022-09-04 DIAGNOSIS — F250 Schizoaffective disorder, bipolar type: Secondary | ICD-10-CM | POA: Diagnosis not present

## 2022-09-05 ENCOUNTER — Other Ambulatory Visit: Payer: Self-pay | Admitting: Neurology

## 2022-09-05 DIAGNOSIS — Z944 Liver transplant status: Secondary | ICD-10-CM | POA: Diagnosis not present

## 2022-09-05 DIAGNOSIS — F79 Unspecified intellectual disabilities: Secondary | ICD-10-CM

## 2022-09-05 DIAGNOSIS — F259 Schizoaffective disorder, unspecified: Secondary | ICD-10-CM | POA: Diagnosis not present

## 2022-09-05 DIAGNOSIS — Z7951 Long term (current) use of inhaled steroids: Secondary | ICD-10-CM | POA: Diagnosis not present

## 2022-09-05 DIAGNOSIS — Z9181 History of falling: Secondary | ICD-10-CM | POA: Diagnosis not present

## 2022-09-05 DIAGNOSIS — G40909 Epilepsy, unspecified, not intractable, without status epilepticus: Secondary | ICD-10-CM

## 2022-09-06 ENCOUNTER — Ambulatory Visit: Payer: Medicaid Other

## 2022-09-06 NOTE — Telephone Encounter (Signed)
Requested Prescriptions   Pending Prescriptions Disp Refills   divalproex (DEPAKOTE ER) 500 MG 24 hr tablet [Pharmacy Med Name: DIVALPROEX EXTENDED RELEASE 500MG  T] 180 tablet 3    Sig: TAKE 2 TABLETS(1000 MG) BY MOUTH AT BEDTIME   Last visit yan 06/07/22, upcoming visit was not scheduled due 06/08/23. Routing to provider to review for refill  Dispenses   Dispensed Days Supply Quantity Provider Pharmacy  DIVALPROEX EXTENDED RELEASE 500MG  T 05/30/2022 90 180 each Glean Salvo, NP [facility] DR[facility] STORE #...  DIVALPROEX EXTENDED RELEASE 500MG  T 02/28/2022 90 180 each Glean Salvo, NP [facility] DR[facility] STORE #...  DIVALPROEX EXTENDED RELEASE 500MG  T 12/06/2021 90 180 each Glean Salvo, NP [facility] DR[facility] STORE #.[facility] Kitchen[facility] Kitchen

## 2022-09-10 ENCOUNTER — Ambulatory Visit: Payer: Medicaid Other | Admitting: Family Medicine

## 2022-09-11 DIAGNOSIS — Z79621 Long term (current) use of calcineurin inhibitor: Secondary | ICD-10-CM | POA: Diagnosis not present

## 2022-09-11 DIAGNOSIS — Z7951 Long term (current) use of inhaled steroids: Secondary | ICD-10-CM | POA: Diagnosis not present

## 2022-09-11 DIAGNOSIS — Z9181 History of falling: Secondary | ICD-10-CM | POA: Diagnosis not present

## 2022-09-11 DIAGNOSIS — F259 Schizoaffective disorder, unspecified: Secondary | ICD-10-CM | POA: Diagnosis not present

## 2022-09-11 DIAGNOSIS — Z944 Liver transplant status: Secondary | ICD-10-CM | POA: Diagnosis not present

## 2022-09-14 ENCOUNTER — Encounter: Payer: Self-pay | Admitting: Podiatry

## 2022-09-14 ENCOUNTER — Ambulatory Visit (INDEPENDENT_AMBULATORY_CARE_PROVIDER_SITE_OTHER): Payer: Medicaid Other | Admitting: Podiatry

## 2022-09-14 DIAGNOSIS — B351 Tinea unguium: Secondary | ICD-10-CM

## 2022-09-14 DIAGNOSIS — Z7689 Persons encountering health services in other specified circumstances: Secondary | ICD-10-CM | POA: Diagnosis not present

## 2022-09-14 DIAGNOSIS — M79674 Pain in right toe(s): Secondary | ICD-10-CM

## 2022-09-14 DIAGNOSIS — M79675 Pain in left toe(s): Secondary | ICD-10-CM | POA: Diagnosis not present

## 2022-09-14 NOTE — Progress Notes (Signed)
This patient returns to the office for evaluation and treatment of long thick painful nails .  This patient is unable to trim her own nails since the patient cannot reach her feet.  Patient says the nails are painful walking and wearing her shoes.  She returns for preventive foot care services. ? ?General Appearance  Alert, conversant and in no acute stress. ? ?Vascular  Dorsalis pedis and posterior tibial  pulses are palpable  bilaterally.  Capillary return is within normal limits  bilaterally. Temperature is within normal limits  bilaterally. ? ?Neurologic  Senn-Weinstein monofilament wire test within normal limits  bilaterally. Muscle power within normal limits bilaterally. ? ?Nails Thick disfigured discolored nails with subungual debris  from hallux to fifth toes bilaterally. No evidence of bacterial infection or drainage bilaterally. ? ?Orthopedic  No limitations of motion  feet .  No crepitus or effusions noted.  No bony pathology or digital deformities noted. ? ?Skin  normotropic skin with no porokeratosis noted bilaterally.  No signs of infections or ulcers noted.    ? ?Onychomycosis  Pain in toes right foot  Pain in toes left foot ? ?Debridement  of nails  1-5  B/L with a nail nipper.  Nails were then filed using a dremel tool with no incidents.    RTC  4 months  ? ? ?Aava Deland DPM  ?

## 2022-09-17 ENCOUNTER — Ambulatory Visit (INDEPENDENT_AMBULATORY_CARE_PROVIDER_SITE_OTHER): Payer: Medicaid Other | Admitting: Student

## 2022-09-17 ENCOUNTER — Encounter: Payer: Self-pay | Admitting: Student

## 2022-09-17 VITALS — BP 128/78 | HR 83 | Ht 60.0 in | Wt 226.0 lb

## 2022-09-17 DIAGNOSIS — Z7951 Long term (current) use of inhaled steroids: Secondary | ICD-10-CM | POA: Diagnosis not present

## 2022-09-17 DIAGNOSIS — J45909 Unspecified asthma, uncomplicated: Secondary | ICD-10-CM | POA: Diagnosis not present

## 2022-09-17 DIAGNOSIS — F259 Schizoaffective disorder, unspecified: Secondary | ICD-10-CM | POA: Diagnosis not present

## 2022-09-17 DIAGNOSIS — M1711 Unilateral primary osteoarthritis, right knee: Secondary | ICD-10-CM

## 2022-09-17 DIAGNOSIS — Z7689 Persons encountering health services in other specified circumstances: Secondary | ICD-10-CM | POA: Diagnosis not present

## 2022-09-17 DIAGNOSIS — Z9181 History of falling: Secondary | ICD-10-CM | POA: Diagnosis not present

## 2022-09-17 DIAGNOSIS — M25561 Pain in right knee: Secondary | ICD-10-CM

## 2022-09-17 DIAGNOSIS — Z944 Liver transplant status: Secondary | ICD-10-CM | POA: Diagnosis not present

## 2022-09-17 MED ORDER — LIDOCAINE 4 % EX PTCH: 1.0000 | MEDICATED_PATCH | CUTANEOUS | 1 refills | Status: DC

## 2022-09-17 NOTE — Progress Notes (Unsigned)
    SUBJECTIVE:   CHIEF COMPLAINT / HPI:   Right knee pain Present since December 2023 after a fall.  Was not having the pain during our last visit 07/2522.  She did have imaging of the right knee on 05/04/2022 showing no acute findings and mild osteoarthritis.  Today states the pain is worse when she is cold and when she is climbing up or down stairs. Not worse at a particular time of day. She has been taking Tylenol extra strength 1 tablet every 4 hours which helps some.   PERTINENT  PMH / PSH: ***  OBJECTIVE:   There were no vitals taken for this visit. ***  General: NAD, pleasant, able to participate in exam Cardiac: RRR, no murmurs. Respiratory: CTAB, normal effort, No wheezes, rales or rhonchi Abdomen: Bowel sounds present, nontender, nondistended, no hepatosplenomegaly. Extremities: no edema or cyanosis. Skin: warm and dry, no rashes noted Neuro: alert, no obvious focal deficits Psych: Normal affect and mood  ASSESSMENT/PLAN:   No problem-specific Assessment & Plan notes found for this encounter.     Dr. [name], DO [facility]    {    This will disappear when note is signed, click to select method of visit    :1}

## 2022-09-17 NOTE — Patient Instructions (Addendum)
It was great to see you! Thank you for allowing me to participate in your care!  I recommend that you always bring your medications to each appointment as this makes it easy to ensure you are on the correct medications and helps [place] not miss when refills are needed.  Our plans for today:  - You can take Tylenol 1000 mg (2 tablets) every 6 hours as needed.  - I sent lidocaine patched to your pharmacy for your knee. Use 1 patch a day - If the patch does not help, you can also purchase capsacin cream over the counter to try.  - Return if these things are not improving your pain    Take care and seek immediate care sooner if you develop any concerns.   Dr. [name], DO [facility]

## 2022-09-18 DIAGNOSIS — J45909 Unspecified asthma, uncomplicated: Secondary | ICD-10-CM | POA: Diagnosis not present

## 2022-09-19 ENCOUNTER — Telehealth: Payer: Self-pay | Admitting: Student

## 2022-09-19 DIAGNOSIS — M1711 Unilateral primary osteoarthritis, right knee: Secondary | ICD-10-CM | POA: Insufficient documentation

## 2022-09-19 DIAGNOSIS — J45909 Unspecified asthma, uncomplicated: Secondary | ICD-10-CM | POA: Diagnosis not present

## 2022-09-19 NOTE — Assessment & Plan Note (Signed)
Pain is likely due to known osteoarthritis which has been seen on imaging.  We are limited in medications we can use as patient states she cannot tolerate NSAIDs as they cause her stomach upset.  She cannot use Voltaren gel as it is contraindicated per her transplant providers.  I initially advised pt she could increase her Tylenol to 1000 mg every 6 hours as needed on bad days however then advised pt to call the liver transplant providers at [facility] to get confirmation that this would be okay.  I also sent in a prescription for lidocaine patches and advised patient she could also try capsaicin cream.  If these interventions do not help with her pain, she is advised to return.  We can consider Cymbalta and/or physical therapy.

## 2022-09-19 NOTE — Telephone Encounter (Signed)
Letter stating pt may exercise at gym and list of current medications placed in front office for pick up per pt request.

## 2022-09-20 ENCOUNTER — Ambulatory Visit (INDEPENDENT_AMBULATORY_CARE_PROVIDER_SITE_OTHER): Payer: Medicaid Other | Admitting: Family Medicine

## 2022-09-20 VITALS — BP 124/78 | HR 102 | Wt 228.0 lb

## 2022-09-20 DIAGNOSIS — N8500 Endometrial hyperplasia, unspecified: Secondary | ICD-10-CM

## 2022-09-20 DIAGNOSIS — N939 Abnormal uterine and vaginal bleeding, unspecified: Secondary | ICD-10-CM

## 2022-09-20 DIAGNOSIS — N950 Postmenopausal bleeding: Secondary | ICD-10-CM

## 2022-09-20 LAB — POCT URINE PREGNANCY: Preg Test, Ur: NEGATIVE

## 2022-09-20 NOTE — Assessment & Plan Note (Addendum)
Endometrial biopsy unable to be performed 2/2 patient comfort and difficulty opening speculum, while inserted. Will place referral to Gynecology for Endometrial Biopsy.  -Amb Ref Gynecology

## 2022-09-20 NOTE — Patient Instructions (Signed)
It was great to see you! Thank you for allowing me to participate in your care!  We were unable to complete the endometrial biopsy today, and will refer you to Gynecology to have the biopsy done. They may be better able to help obtain the biopsy while keeping you comfortable.   Take care and seek immediate care sooner if you develop any concerns.   Dr. Bess Kinds, MD [facility]

## 2022-09-20 NOTE — Progress Notes (Signed)
  SUBJECTIVE:   CHIEF COMPLAINT / HPI:   Abnormal uterine bleeding in post-menopausal woman  PERTINENT  PMH / PSH:   Patient Care Team: [name], DO as PCP - General (Family Medicine) OBJECTIVE:  BP 124/78   Pulse (!) 102   Wt 228 lb (103.4 kg)   SpO2 98%   BMI 44.53 kg/m  Physical Exam Exam conducted with a chaperone present.  Genitourinary:    Labia:        Right: No rash.        Left: No rash.      Vagina: Normal.      ASSESSMENT/PLAN:  Abnormal uterine bleeding (AUB) -     POCT urine pregnancy -     Ambulatory referral to Gynecology  Postmenopausal bleeding Assessment & Plan: Endometrial biopsy unable to be performed 2/2 patient comfort and difficulty opening speculum, while inserted. Will place referral to Gynecology for Endometrial Biopsy.  -Amb Ref Gynecology  Orders: -     Ambulatory referral to Gynecology  Endometrial hyperplasia -     Ambulatory referral to Gynecology   No follow-ups on file. Bess Kinds, MD 09/20/2022, 10:54 AM PGY-2, [facility]

## 2022-09-21 DIAGNOSIS — J45909 Unspecified asthma, uncomplicated: Secondary | ICD-10-CM | POA: Diagnosis not present

## 2022-09-24 DIAGNOSIS — J45909 Unspecified asthma, uncomplicated: Secondary | ICD-10-CM | POA: Diagnosis not present

## 2022-09-25 DIAGNOSIS — F259 Schizoaffective disorder, unspecified: Secondary | ICD-10-CM | POA: Diagnosis not present

## 2022-09-25 DIAGNOSIS — Z9181 History of falling: Secondary | ICD-10-CM | POA: Diagnosis not present

## 2022-09-25 DIAGNOSIS — Z79621 Long term (current) use of calcineurin inhibitor: Secondary | ICD-10-CM | POA: Diagnosis not present

## 2022-09-25 DIAGNOSIS — J45909 Unspecified asthma, uncomplicated: Secondary | ICD-10-CM | POA: Diagnosis not present

## 2022-09-25 DIAGNOSIS — Z7951 Long term (current) use of inhaled steroids: Secondary | ICD-10-CM | POA: Diagnosis not present

## 2022-09-25 DIAGNOSIS — Z944 Liver transplant status: Secondary | ICD-10-CM | POA: Diagnosis not present

## 2022-09-26 DIAGNOSIS — J45909 Unspecified asthma, uncomplicated: Secondary | ICD-10-CM | POA: Diagnosis not present

## 2022-09-27 DIAGNOSIS — J45909 Unspecified asthma, uncomplicated: Secondary | ICD-10-CM | POA: Diagnosis not present

## 2022-09-28 DIAGNOSIS — J45909 Unspecified asthma, uncomplicated: Secondary | ICD-10-CM | POA: Diagnosis not present

## 2022-10-01 ENCOUNTER — Ambulatory Visit: Payer: Medicaid Other | Admitting: Family Medicine

## 2022-10-01 DIAGNOSIS — J45909 Unspecified asthma, uncomplicated: Secondary | ICD-10-CM | POA: Diagnosis not present

## 2022-10-02 ENCOUNTER — Ambulatory Visit (INDEPENDENT_AMBULATORY_CARE_PROVIDER_SITE_OTHER): Payer: Medicaid Other | Admitting: Family Medicine

## 2022-10-02 ENCOUNTER — Encounter: Payer: Self-pay | Admitting: Family Medicine

## 2022-10-02 VITALS — Ht 59.0 in | Wt 227.8 lb

## 2022-10-02 DIAGNOSIS — Z944 Liver transplant status: Secondary | ICD-10-CM

## 2022-10-02 DIAGNOSIS — J45909 Unspecified asthma, uncomplicated: Secondary | ICD-10-CM | POA: Diagnosis not present

## 2022-10-02 DIAGNOSIS — Z7689 Persons encountering health services in other specified circumstances: Secondary | ICD-10-CM | POA: Diagnosis not present
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
## 2022-10-02 NOTE — Patient Instructions (Addendum)
Remember to add your vitamins and the prescription to your list for your uncle.    It would be ideal if you start having a consistent bedtime and getting up time.  Please talk with your liver doctor on Monday to ask if it would be ok for you to start taking most of your bedtime meds at 4 PM, which is 12 hours after your morning meds (4 AM).  Confirm with him that the medications you will want to continue to take at bedtime are the divalproex (Depakote) and your sleep medicine.  Taking your meds earlier will allow you to eat your dinner at 5, and then have a bedtime snack, not delaying bedtime.    Yesterday lunch included 2 slices of bread (2 carb portions), 2 cups of noodles (4 carb portions), and 12 oz of lemonade (3 carb portions).  This equals 9 carb portions, when a more appropriate amount of carb is 2 to 3 carb portions per meal.    Goals: Limit starchy foods to TWO portions per meal and ONE per snack. ONE portion of a starchy food is equal to the following:  - ONE slice of bread (or its equivalent, such as half of a hamburger bun).  - 1/2 cup of a "scoopable" starchy food such as potatoes or rice.  - 15 grams of Total Carbohydrate as shown on food label.  - 4 ounces of a sweet drink (including fruit juice). Include vegetables at both lunch & dinner.  (1 serving =  cup cooked veg or 1 cup of salad). Go to bed no later than 10 PM, and get up no later than 6 AM every day.         If you must nap, limit your nap to 30 minutes, and make sure you are up by 2 PM.       Talk to your aide about going for a walk right after breakfast, which may be energizing.    Document progress on your goals provided today.    Follow-up appt on Tuesday, Jun 18 at 2 PM.    If you have questions, call or email Dr. Gerilyn Pilgrim: [phone]; [email or web address].

## 2022-10-03 DIAGNOSIS — Z7951 Long term (current) use of inhaled steroids: Secondary | ICD-10-CM | POA: Diagnosis not present

## 2022-10-03 DIAGNOSIS — F259 Schizoaffective disorder, unspecified: Secondary | ICD-10-CM | POA: Diagnosis not present

## 2022-10-03 DIAGNOSIS — Z944 Liver transplant status: Secondary | ICD-10-CM | POA: Diagnosis not present

## 2022-10-03 DIAGNOSIS — J45909 Unspecified asthma, uncomplicated: Secondary | ICD-10-CM | POA: Diagnosis not present

## 2022-10-03 DIAGNOSIS — Z9181 History of falling: Secondary | ICD-10-CM | POA: Diagnosis not present

## 2022-10-03 DIAGNOSIS — Z79621 Long term (current) use of calcineurin inhibitor: Secondary | ICD-10-CM | POA: Diagnosis not present

## 2022-10-04 ENCOUNTER — Telehealth: Payer: Self-pay

## 2022-10-04 DIAGNOSIS — J45909 Unspecified asthma, uncomplicated: Secondary | ICD-10-CM | POA: Diagnosis not present

## 2022-10-04 NOTE — Telephone Encounter (Signed)
Rec'd PA request for patients lidocaine patches.   Pt must try and fail 2 alternatives:  Cymbalta, gabapentin, or Lyrica.  Lidocaine 4% patches available OTC

## 2022-10-05 DIAGNOSIS — J45909 Unspecified asthma, uncomplicated: Secondary | ICD-10-CM | POA: Diagnosis not present

## 2022-10-08 DIAGNOSIS — Z4823 Encounter for aftercare following liver transplant: Secondary | ICD-10-CM | POA: Diagnosis not present

## 2022-10-08 DIAGNOSIS — Z79621 Long term (current) use of calcineurin inhibitor: Secondary | ICD-10-CM | POA: Diagnosis not present

## 2022-10-08 DIAGNOSIS — E785 Hyperlipidemia, unspecified: Secondary | ICD-10-CM | POA: Diagnosis not present

## 2022-10-08 DIAGNOSIS — Z800 Family history of malignant neoplasm of digestive organs: Secondary | ICD-10-CM | POA: Diagnosis not present

## 2022-10-08 DIAGNOSIS — Z6841 Body Mass Index (BMI) 40.0 and over, adult: Secondary | ICD-10-CM | POA: Diagnosis not present

## 2022-10-08 DIAGNOSIS — Z713 Dietary counseling and surveillance: Secondary | ICD-10-CM | POA: Diagnosis not present

## 2022-10-08 DIAGNOSIS — J45909 Unspecified asthma, uncomplicated: Secondary | ICD-10-CM | POA: Diagnosis not present

## 2022-10-08 DIAGNOSIS — Z944 Liver transplant status: Secondary | ICD-10-CM | POA: Diagnosis not present

## 2022-10-08 DIAGNOSIS — Z1283 Encounter for screening for malignant neoplasm of skin: Secondary | ICD-10-CM | POA: Diagnosis not present

## 2022-10-08 DIAGNOSIS — N182 Chronic kidney disease, stage 2 (mild): Secondary | ICD-10-CM | POA: Diagnosis not present

## 2022-10-08 DIAGNOSIS — N189 Chronic kidney disease, unspecified: Secondary | ICD-10-CM | POA: Diagnosis not present

## 2022-10-08 DIAGNOSIS — D649 Anemia, unspecified: Secondary | ICD-10-CM | POA: Diagnosis not present

## 2022-10-08 DIAGNOSIS — I2510 Atherosclerotic heart disease of native coronary artery without angina pectoris: Secondary | ICD-10-CM | POA: Diagnosis not present

## 2022-10-08 DIAGNOSIS — D849 Immunodeficiency, unspecified: Secondary | ICD-10-CM | POA: Diagnosis not present

## 2022-10-08 DIAGNOSIS — K219 Gastro-esophageal reflux disease without esophagitis: Secondary | ICD-10-CM | POA: Diagnosis not present

## 2022-10-09 DIAGNOSIS — Z79621 Long term (current) use of calcineurin inhibitor: Secondary | ICD-10-CM | POA: Diagnosis not present

## 2022-10-09 DIAGNOSIS — Z9181 History of falling: Secondary | ICD-10-CM | POA: Diagnosis not present

## 2022-10-09 DIAGNOSIS — F259 Schizoaffective disorder, unspecified: Secondary | ICD-10-CM | POA: Diagnosis not present

## 2022-10-09 DIAGNOSIS — J45909 Unspecified asthma, uncomplicated: Secondary | ICD-10-CM | POA: Diagnosis not present

## 2022-10-09 DIAGNOSIS — Z7951 Long term (current) use of inhaled steroids: Secondary | ICD-10-CM | POA: Diagnosis not present

## 2022-10-09 DIAGNOSIS — Z944 Liver transplant status: Secondary | ICD-10-CM | POA: Diagnosis not present

## 2022-10-11 ENCOUNTER — Ambulatory Visit: Payer: Medicaid Other | Admitting: Obstetrics and Gynecology

## 2022-10-16 DIAGNOSIS — Z9181 History of falling: Secondary | ICD-10-CM | POA: Diagnosis not present

## 2022-10-16 DIAGNOSIS — F259 Schizoaffective disorder, unspecified: Secondary | ICD-10-CM | POA: Diagnosis not present

## 2022-10-16 DIAGNOSIS — Z944 Liver transplant status: Secondary | ICD-10-CM | POA: Diagnosis not present

## 2022-10-16 DIAGNOSIS — Z7951 Long term (current) use of inhaled steroids: Secondary | ICD-10-CM | POA: Diagnosis not present

## 2022-10-17 DIAGNOSIS — J45909 Unspecified asthma, uncomplicated: Secondary | ICD-10-CM | POA: Diagnosis not present

## 2022-10-18 DIAGNOSIS — J45909 Unspecified asthma, uncomplicated: Secondary | ICD-10-CM | POA: Diagnosis not present

## 2022-10-19 ENCOUNTER — Other Ambulatory Visit: Payer: Self-pay | Admitting: Student

## 2022-10-19 DIAGNOSIS — Z944 Liver transplant status: Secondary | ICD-10-CM | POA: Diagnosis not present

## 2022-10-19 DIAGNOSIS — Z7951 Long term (current) use of inhaled steroids: Secondary | ICD-10-CM | POA: Diagnosis not present

## 2022-10-19 DIAGNOSIS — E038 Other specified hypothyroidism: Secondary | ICD-10-CM

## 2022-10-19 DIAGNOSIS — Z9181 History of falling: Secondary | ICD-10-CM | POA: Diagnosis not present

## 2022-10-19 DIAGNOSIS — F259 Schizoaffective disorder, unspecified: Secondary | ICD-10-CM | POA: Diagnosis not present

## 2022-10-24 DIAGNOSIS — J45909 Unspecified asthma, uncomplicated: Secondary | ICD-10-CM | POA: Diagnosis not present

## 2022-10-25 DIAGNOSIS — J45909 Unspecified asthma, uncomplicated: Secondary | ICD-10-CM | POA: Diagnosis not present

## 2022-10-26 DIAGNOSIS — Z7951 Long term (current) use of inhaled steroids: Secondary | ICD-10-CM | POA: Diagnosis not present

## 2022-10-26 DIAGNOSIS — F259 Schizoaffective disorder, unspecified: Secondary | ICD-10-CM | POA: Diagnosis not present

## 2022-10-26 DIAGNOSIS — J45909 Unspecified asthma, uncomplicated: Secondary | ICD-10-CM | POA: Diagnosis not present

## 2022-10-26 DIAGNOSIS — Z944 Liver transplant status: Secondary | ICD-10-CM | POA: Diagnosis not present

## 2022-10-26 DIAGNOSIS — Z9181 History of falling: Secondary | ICD-10-CM | POA: Diagnosis not present

## 2022-10-30 DIAGNOSIS — F259 Schizoaffective disorder, unspecified: Secondary | ICD-10-CM | POA: Diagnosis not present

## 2022-10-30 DIAGNOSIS — Z9181 History of falling: Secondary | ICD-10-CM | POA: Diagnosis not present

## 2022-10-30 DIAGNOSIS — Z7951 Long term (current) use of inhaled steroids: Secondary | ICD-10-CM | POA: Diagnosis not present

## 2022-10-30 DIAGNOSIS — J45909 Unspecified asthma, uncomplicated: Secondary | ICD-10-CM | POA: Diagnosis not present

## 2022-10-30 DIAGNOSIS — Z944 Liver transplant status: Secondary | ICD-10-CM | POA: Diagnosis not present

## 2022-10-31 DIAGNOSIS — J45909 Unspecified asthma, uncomplicated: Secondary | ICD-10-CM | POA: Diagnosis not present

## 2022-11-05 DIAGNOSIS — J45909 Unspecified asthma, uncomplicated: Secondary | ICD-10-CM | POA: Diagnosis not present

## 2022-11-06 DIAGNOSIS — J45909 Unspecified asthma, uncomplicated: Secondary | ICD-10-CM | POA: Diagnosis not present

## 2022-11-07 DIAGNOSIS — F259 Schizoaffective disorder, unspecified: Secondary | ICD-10-CM | POA: Diagnosis not present

## 2022-11-07 DIAGNOSIS — J45909 Unspecified asthma, uncomplicated: Secondary | ICD-10-CM | POA: Diagnosis not present

## 2022-11-07 DIAGNOSIS — Z7951 Long term (current) use of inhaled steroids: Secondary | ICD-10-CM | POA: Diagnosis not present

## 2022-11-07 DIAGNOSIS — Z9181 History of falling: Secondary | ICD-10-CM | POA: Diagnosis not present

## 2022-11-07 DIAGNOSIS — Z944 Liver transplant status: Secondary | ICD-10-CM | POA: Diagnosis not present

## 2022-11-08 DIAGNOSIS — J45909 Unspecified asthma, uncomplicated: Secondary | ICD-10-CM | POA: Diagnosis not present

## 2022-11-12 DIAGNOSIS — J45909 Unspecified asthma, uncomplicated: Secondary | ICD-10-CM | POA: Diagnosis not present

## 2022-11-13 ENCOUNTER — Ambulatory Visit: Payer: Medicaid Other | Admitting: Family Medicine

## 2022-11-13 DIAGNOSIS — Z9181 History of falling: Secondary | ICD-10-CM | POA: Diagnosis not present

## 2022-11-13 DIAGNOSIS — J45909 Unspecified asthma, uncomplicated: Secondary | ICD-10-CM | POA: Diagnosis not present

## 2022-11-13 DIAGNOSIS — Z79621 Long term (current) use of calcineurin inhibitor: Secondary | ICD-10-CM | POA: Diagnosis not present

## 2022-11-13 DIAGNOSIS — Z944 Liver transplant status: Secondary | ICD-10-CM | POA: Diagnosis not present

## 2022-11-13 DIAGNOSIS — Z7951 Long term (current) use of inhaled steroids: Secondary | ICD-10-CM | POA: Diagnosis not present

## 2022-11-13 DIAGNOSIS — F259 Schizoaffective disorder, unspecified: Secondary | ICD-10-CM | POA: Diagnosis not present

## 2022-11-15 DIAGNOSIS — J45909 Unspecified asthma, uncomplicated: Secondary | ICD-10-CM | POA: Diagnosis not present

## 2022-11-19 DIAGNOSIS — J45909 Unspecified asthma, uncomplicated: Secondary | ICD-10-CM | POA: Diagnosis not present

## 2022-11-20 DIAGNOSIS — J45909 Unspecified asthma, uncomplicated: Secondary | ICD-10-CM | POA: Diagnosis not present

## 2022-11-20 NOTE — Progress Notes (Deleted)
Medical Nutrition Therapy Appt start time: 1400 end time: 1500 (1 hour) Primary concerns today: Weight management and Liver transplant recipient .   Relevant history/background: [name] has been told by her liver transplant doctor that weight loss is imperative, although her weight has remained stable or risen over the past several years (was ~200 lb in 2019 and early 2020; currently ~228 lb).    Assessment:  [name]'s aide (who comes 3 hrs on Monday and from 10:45 AM to 12:45 Tues through Fri) usually makes lunch, with enough left over for dinner.  She also does light housework.  On some days, they watch movies together before she leaves.  At last MNT appt, we discussed [name]'s erratic sleep pattern  (bedtime ranging from 9 PM to 2 AM).  Sometimes she uses her computer and watches movies all night.  and occasionally, stays in bed all day (if her aide doesn't come).  She goes to bed with both the TV and light on.   Learning Readiness: Ready  Weight: *** lb; 227.8 lb on 10/02/22; ht is 59".   Usual eating pattern: 3 meals and 1-2 snacks per day. Usual physical activity: None. Sleep:  ***Erratic, usually dictated by TV schedule.  24-hr recall suggests intake of *** kcal:  (Up at  AM) B ( AM)-   Snk ( AM)-   L ( PM)-   Snk ( PM)-   D ( PM)-   Snk ( PM)-   Typical day? {yes [id number]   Nutritional Diagnosis: *** NI-5.8.2 Excessive carbohydrate intake As related to multiple carb foods and excessive quantities at meals.  As evidenced by yesterday's lunch that included 9 carb portions*. ***  Handouts given during visit include: After-Visit Summary (AVS) Goals Sheet  ***  Demonstrated degree of understanding via:  Teach Back  Barriers to learning/adherence to lifestyle change: some degree of cognitive impairment.    For behavioral goals and recommendations, see Patient Instructions.    Monitoring/Evaluation:  Dietary intake, exercise, and body weight in 6 week(s). From 5/7 *** Remember to add  your vitamins and the prescription to your list for your uncle.     It would be ideal if you start having a consistent bedtime and getting up time.  Please talk with your liver doctor on Monday to ask if it would be ok for you to start taking most of your bedtime meds at 4 PM, which is 12 hours after your morning meds (4 AM).  Confirm with him that the medications you will want to continue to take at bedtime are the divalproex (Depakote) and your sleep medicine.  Taking your meds earlier will allow you to eat your dinner at 5, and then have a bedtime snack, not delaying bedtime.     Goals: Limit starchy foods to TWO portions per meal and ONE per snack. ONE portion of a starchy food is equal to the following:  - ONE slice of bread (or its equivalent, such as half of a hamburger bun).  - 1/2 cup of a "scoopable" starchy food such as potatoes or rice.  - 15 grams of Total Carbohydrate as shown on food label.  - 4 ounces of a sweet drink (including fruit juice). Include vegetables at both lunch & dinner.  (1 serving =  cup cooked veg or 1 cup of salad). Go to bed no later than 10 PM, and get up no later than 6 AM every day.         If you must nap,  limit your nap to 30 minutes, and make sure you are up by 2 PM.       Talk to your aide about going for a walk right after breakfast, which may be energizing.     Document progress on your goals provided today.     Follow-up appt on ***.

## 2022-11-21 DIAGNOSIS — Z7951 Long term (current) use of inhaled steroids: Secondary | ICD-10-CM | POA: Diagnosis not present

## 2022-11-21 DIAGNOSIS — Z944 Liver transplant status: Secondary | ICD-10-CM | POA: Diagnosis not present

## 2022-11-21 DIAGNOSIS — F259 Schizoaffective disorder, unspecified: Secondary | ICD-10-CM | POA: Diagnosis not present

## 2022-11-21 DIAGNOSIS — Z9181 History of falling: Secondary | ICD-10-CM | POA: Diagnosis not present

## 2022-11-21 DIAGNOSIS — J45909 Unspecified asthma, uncomplicated: Secondary | ICD-10-CM | POA: Diagnosis not present

## 2022-11-22 ENCOUNTER — Ambulatory Visit: Payer: Medicaid Other | Admitting: Family Medicine

## 2022-11-22 DIAGNOSIS — F250 Schizoaffective disorder, bipolar type: Secondary | ICD-10-CM | POA: Diagnosis not present

## 2022-11-23 DIAGNOSIS — Z944 Liver transplant status: Secondary | ICD-10-CM | POA: Diagnosis not present

## 2022-11-23 DIAGNOSIS — Z7951 Long term (current) use of inhaled steroids: Secondary | ICD-10-CM | POA: Diagnosis not present

## 2022-11-23 DIAGNOSIS — F259 Schizoaffective disorder, unspecified: Secondary | ICD-10-CM | POA: Diagnosis not present

## 2022-11-23 DIAGNOSIS — Z9181 History of falling: Secondary | ICD-10-CM | POA: Diagnosis not present

## 2022-11-26 ENCOUNTER — Ambulatory Visit (INDEPENDENT_AMBULATORY_CARE_PROVIDER_SITE_OTHER): Payer: MEDICAID | Admitting: Family Medicine

## 2022-11-26 VITALS — Ht 59.0 in | Wt 226.6 lb

## 2022-11-26 DIAGNOSIS — E661 Drug-induced obesity: Secondary | ICD-10-CM | POA: Diagnosis not present

## 2022-11-26 DIAGNOSIS — Z6841 Body Mass Index (BMI) 40.0 and over, adult: Secondary | ICD-10-CM | POA: Diagnosis not present

## 2022-11-26 DIAGNOSIS — Z944 Liver transplant status: Secondary | ICD-10-CM

## 2022-11-26 NOTE — Patient Instructions (Signed)
Ask your godmother to NOT call you in the middle of the night.  Tell her that your doctor wants you to get better sleep, which is really important to your health.    When you are talking with your friend on the phone, make sure you end your phone call no later than 11 PM.    If you want to know the potassium amount in a food, that is listed on the nutrition label.     It would be ideal if you start having a consistent bedtime and getting up time.  Please talk with your liver doctor on Monday to ask if it would be ok for you to start taking most of your bedtime meds at 4 PM, which is 12 hours after your morning meds (4 AM).  Confirm with him that the medications you will want to continue to take at bedtime are the divalproex (Depakote) and your sleep medicine.  Taking your meds earlier will allow you to eat your dinner at 5, and then have a bedtime snack, not delaying bedtime.    You had 3 packets of instant grits yesterday.  Each packet provides 22 grams of total carbohydrate, which is 22 X 3 = 66 grams.  This is equal to more than 3 carb portions.  (Each carb portion is 15 grams of carbohydrate, so 2 carb portions = 30 grams.)  WHEN YOU EAT A FOOD THAT IS STARCHY, READ THE LABEL, SO YOU KNOW HOW TO LIMIT YOUR CARB INTAKE TO 2 PORTIONS (OR 30 GRAMS) AT THAT MEAL.   Always check the serving size to see if it is the same amount you are eating.  For example, if the microwave popcorn says it has 2.5 servings in one bag, you will need to multiply the grams of carbohydrate by 2.5 to find out how much carb is in a whole bag.  You may want to look for microwave popcorn that comes in smaller portions, such as the 100-calorie packs.    Goals: 1. Limit starchy foods to TWO portions per meal and ONE per snack. ONE portion of a starchy food is equal to the following:  - ONE slice of bread (or its equivalent, such as half of a hamburger bun).  - 1/2 cup of a "scoopable" starchy food such as potatoes or rice.  - 15  grams of Total Carbohydrate as shown on food label.  - 4 ounces of a sweet drink (including fruit juice). Remember sources of carbohydrate (starch):  Carbohydrate includes starch, sugar, and fiber.  Of these, only sugar and starch raise blood glucose.  (Fiber is found in fruits, vegetables [especially skin, seeds, and stalks], whole grains, and beans.)   Starchy (carb) foods: Bread, rice, pasta, potatoes, corn, cereal, grits, crackers, bagels, muffins, all baked goods.  (Fruit, milk, and yogurt also have carbohydrate, but most of these foods will not spike your blood sugar as most starchy or sweet foods will.)  A few fruits do cause high blood sugars; use small portions of bananas (limit to 1/2 at a time), grapes, watermelon, and oranges.   Protein foods: Meat, fish, poultry, eggs, dairy foods, and beans such as pinto and kidney beans (beans also provide carbohydrate).   2. Include vegetables at both lunch & dinner.  (1 serving =  cup cooked veg or 1 cup of salad).    3. Go to bed no later than 10 PM, and get up no later than 6 AM every day.  Document progress on your goals provided today.     Follow-up appt on Thursday, August 8 at 2 PM.

## 2022-11-26 NOTE — Progress Notes (Signed)
Medical Nutrition Therapy Appt start time: 1400 end time: 1500 (1 hour) Primary concerns today: Weight management and Liver transplant recipient .   Relevant history/background: [name] has been told by her liver transplant doctor that weight loss is imperative, although her weight has remained stable or risen over the past several years (was ~200 lb in 2019 and early 2020; currently ~228 lb).    Assessment:  [name], but has not yet obtained her Rx for Amlactin cream.  Her aide's last day was last week, and she will be assessed this Friday to see if she continues to need home assistance.  At last MNT appt, we discussed [name]'s erratic sleep pattern  (bedtime ranging from 9 PM to 2 AM).  Sometimes she uses her computer and watches movies all night, and occasionally stays in bed all day (if her aide doesn't come).  She goes to bed with both the TV and the light on.  Documentation of bedtimes and getting up time showed slight progress in consistency, but her sleep pattern is still highly erratic, including one night of no sleep a couple weeks ago when she was struggling with asthma symptoms.   [name] has attempted to track carb sources, although there is some confusion as to how to calculate how much of a carb portion is represented by serving sizes she chooses.  We reviewed this again today, and I encouraged her to check labels for additional guidance.     Learning Readiness: Ready  Weight: 226.6 lb; 227.8 lb on 10/02/22; ht is 59".   SBP: 154 and 145 last Monday; usually 119-130s.   Usual eating pattern: 3 meals and 1-2 snacks per day. Usual physical activity: None; her uncle forgot to complete paperwork for ex Rx, but he said he will do it this week. Sleep:  Remains erratic, usually dictated by TV schedule.  Asthma has often bothered her sleep recently.  Her godmother sometimes calls at 2 AM to check on her.    24-hr recall indicated 3-9 carb portions per meal:  (Up at 5:15 AM; had gone to  bed at 12 AM) Pre-bkfst (5:30 AM)-  8 oz cranberry juice with med's B (7 AM)-  3 pkts inst grits, 4 slc bacon,  water    3 carb portions Snk ( AM)-  ---   L ( PM)-  --- Snk (3 PM)-  1 Act II microwave butter popcorn, 3-4 c grapes, water 9 carbs D (5 PM)-  1/2 c tuna salad with cheese, 1 can grn beans, 10 cherries, 1 c canned pineapple, 12 oz cranberry juice  6 carbs Snk ( PM)-  8 oz cranberry juice with med's    3 carbs Typical day? Yes.     Nutritional Diagnosis: No progress noted on NI-5.8.2 Excessive carbohydrate intake As related to multiple carb foods and excessive quantities at meals.  As evidenced by yesterday's meals that each included 3-9 carb portions.   Handouts given during visit include: After-Visit Summary (AVS) Goals Sheet  Demonstrated degree of understanding via:  Teach Back  Barriers to learning/adherence to lifestyle change: some degree of cognitive impairment.    For behavioral goals and recommendations, see Patient Instructions.    Monitoring/Evaluation:  Dietary intake, exercise, and body weight in 6 week(s).

## 2022-11-27 DIAGNOSIS — Z7951 Long term (current) use of inhaled steroids: Secondary | ICD-10-CM | POA: Diagnosis not present

## 2022-11-27 DIAGNOSIS — Z944 Liver transplant status: Secondary | ICD-10-CM | POA: Diagnosis not present

## 2022-11-27 DIAGNOSIS — F259 Schizoaffective disorder, unspecified: Secondary | ICD-10-CM | POA: Diagnosis not present

## 2022-11-27 DIAGNOSIS — Z9181 History of falling: Secondary | ICD-10-CM | POA: Diagnosis not present

## 2022-12-04 DIAGNOSIS — Z7951 Long term (current) use of inhaled steroids: Secondary | ICD-10-CM | POA: Diagnosis not present

## 2022-12-04 DIAGNOSIS — F259 Schizoaffective disorder, unspecified: Secondary | ICD-10-CM | POA: Diagnosis not present

## 2022-12-04 DIAGNOSIS — Z944 Liver transplant status: Secondary | ICD-10-CM | POA: Diagnosis not present

## 2022-12-04 DIAGNOSIS — Z9181 History of falling: Secondary | ICD-10-CM | POA: Diagnosis not present

## 2022-12-09 IMAGING — DX DG CHEST 2V
2 series · 2 of 2 positions shown · non-contrast
Comparison: 08/23/2012

CLINICAL DATA: Cough for 7 weeks.  Asthma.

EXAM:
CHEST - 2 VIEW

[dg chest 2 view (1 of 2)]
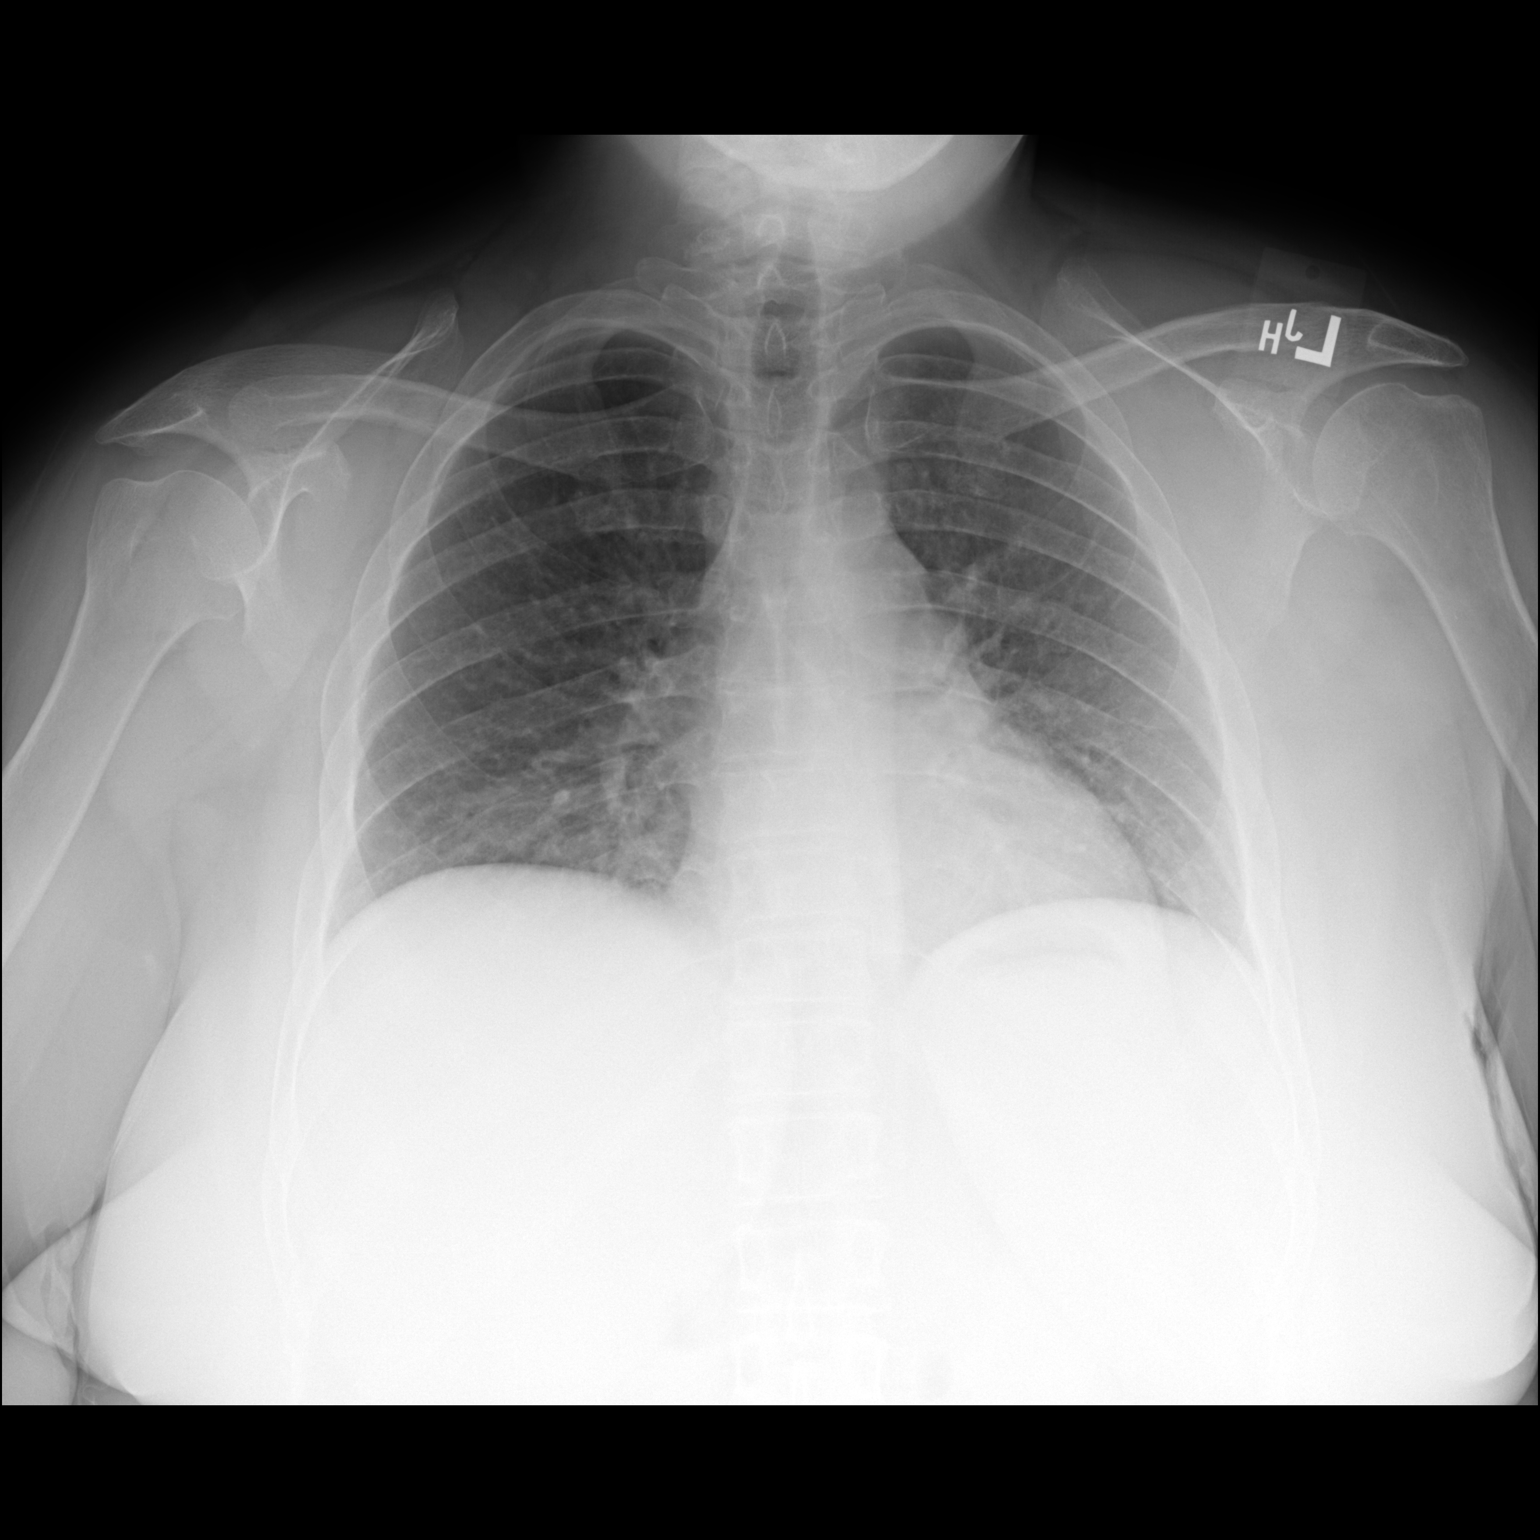

[dg chest 2 view (2 of 2)]
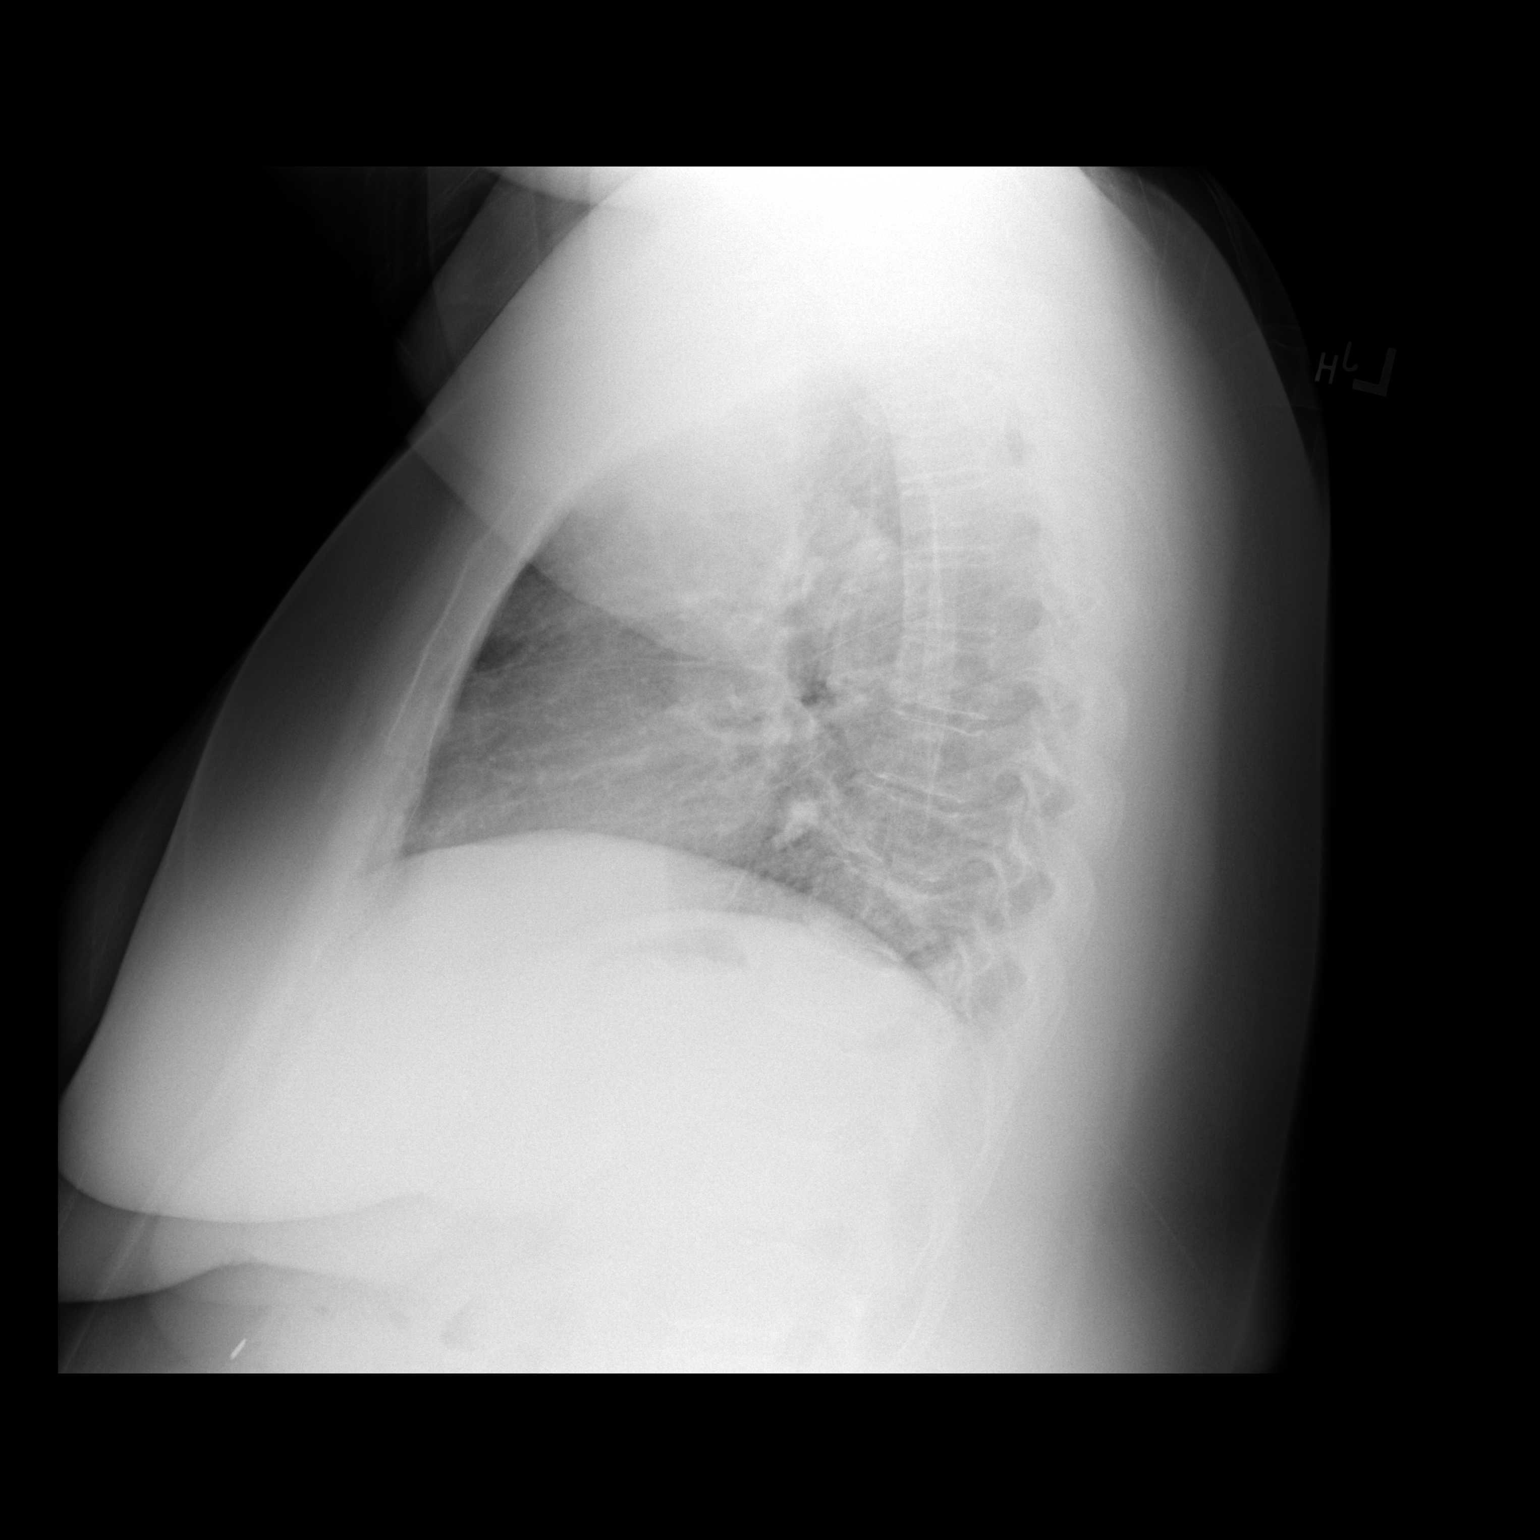

[2 of 2 positions shown; findings below may reference images not displayed]

FINDINGS: The heart size and mediastinal contours are within normal limits.
Both lungs are clear. The visualized skeletal structures are
unremarkable.
IMPRESSION: No active cardiopulmonary disease.

## 2022-12-10 DIAGNOSIS — F259 Schizoaffective disorder, unspecified: Secondary | ICD-10-CM | POA: Diagnosis not present

## 2022-12-10 DIAGNOSIS — Z7951 Long term (current) use of inhaled steroids: Secondary | ICD-10-CM | POA: Diagnosis not present

## 2022-12-10 DIAGNOSIS — Z9181 History of falling: Secondary | ICD-10-CM | POA: Diagnosis not present

## 2022-12-10 DIAGNOSIS — Z944 Liver transplant status: Secondary | ICD-10-CM | POA: Diagnosis not present

## 2022-12-18 DIAGNOSIS — Z7951 Long term (current) use of inhaled steroids: Secondary | ICD-10-CM | POA: Diagnosis not present

## 2022-12-18 DIAGNOSIS — Z9181 History of falling: Secondary | ICD-10-CM | POA: Diagnosis not present

## 2022-12-18 DIAGNOSIS — Z944 Liver transplant status: Secondary | ICD-10-CM | POA: Diagnosis not present

## 2022-12-18 DIAGNOSIS — F259 Schizoaffective disorder, unspecified: Secondary | ICD-10-CM | POA: Diagnosis not present

## 2022-12-19 ENCOUNTER — Ambulatory Visit (INDEPENDENT_AMBULATORY_CARE_PROVIDER_SITE_OTHER): Payer: MEDICAID | Admitting: Obstetrics and Gynecology

## 2022-12-19 ENCOUNTER — Encounter: Payer: Self-pay | Admitting: Obstetrics and Gynecology

## 2022-12-19 VITALS — BP 115/79 | HR 80 | Ht 59.0 in | Wt 225.0 lb

## 2022-12-19 DIAGNOSIS — N950 Postmenopausal bleeding: Secondary | ICD-10-CM

## 2022-12-19 NOTE — Progress Notes (Signed)
[name] presents in referral for PMB. Pt reports LMP was some time in 2022 She had a cycle in Nov, Dec of 2023 and in June of 2024 GYN U/S in Jan revealed a thicken endometrium  Pap smear UTD  PE AF  VSS Lungs clear Heart RRR Abd soft + BS  A/P PMB  Dx discussed with pt. Recommendation of EMBX reviewed with pt. Information provided to pt. Pt will schedule appt for [facility]

## 2022-12-19 NOTE — Progress Notes (Signed)
Pt in office for irregular bleeding.  Pt had cycle in Dec and no other bleeding until July this year. Pt states last cycle was heavier, last 4-5 days. Pt denies pain with cycle.

## 2022-12-21 ENCOUNTER — Telehealth: Payer: Self-pay

## 2022-12-21 NOTE — Telephone Encounter (Signed)
Patient calls nurse line regarding new referral for home health nurse. She states that she was previously with [facility], however, they do not accept her new insurance. She now has [employer].   Please place new referral for home health nursing. She states that she receives nursing for blood work and to fill her weekly pill box.   Veronda Prude, RN

## 2022-12-24 ENCOUNTER — Ambulatory Visit: Payer: MEDICAID | Admitting: Student

## 2022-12-24 ENCOUNTER — Other Ambulatory Visit: Payer: Self-pay

## 2022-12-24 ENCOUNTER — Encounter: Payer: Self-pay | Admitting: Student

## 2022-12-24 VITALS — BP 117/77 | HR 93 | Ht 59.0 in | Wt 227.2 lb

## 2022-12-24 DIAGNOSIS — F79 Unspecified intellectual disabilities: Secondary | ICD-10-CM

## 2022-12-24 DIAGNOSIS — J45909 Unspecified asthma, uncomplicated: Secondary | ICD-10-CM

## 2022-12-24 DIAGNOSIS — I10 Essential (primary) hypertension: Secondary | ICD-10-CM | POA: Diagnosis not present

## 2022-12-24 DIAGNOSIS — Z789 Other specified health status: Secondary | ICD-10-CM | POA: Diagnosis not present

## 2022-12-24 DIAGNOSIS — Z944 Liver transplant status: Secondary | ICD-10-CM | POA: Diagnosis not present

## 2022-12-24 NOTE — Assessment & Plan Note (Addendum)
Pt requires home health RN to assist with filling pill boxes weekly as she has intellectual disability. Her medication list is extensive and she is unable to manage her medications alone. She also needs blood drawn frequently as she is s/p liver transplant. Our pharmacy team was able to fill her pill boxes for the next 2 weeks until she is able to get a home health RN.  -Home health order placed.

## 2022-12-24 NOTE — Patient Instructions (Addendum)
It was great to see you! Thank you for allowing me to participate in your care!  I recommend that you always bring your medications to each appointment as this makes it easy to ensure you are on the correct medications and helps [place] not miss when refills are needed.  Our plans for today:  - I placed an order for home health, they will contact you  - I recommend seeing your allergist sooner than September if your cough worsens -If you feel like you cannot breath well, feel light headed or like your heart is racing, go to the emergency department or urgent care - Return in 6 months or sooner if needed    Take care and seek immediate care sooner if you develop any concerns.   Dr. [name], DO [facility]

## 2022-12-24 NOTE — Assessment & Plan Note (Addendum)
Cough most likely related to asthma. No sick symptoms and lung exam reassuring.  -Continue current regimen. -f/u with asthma/allergist as planned

## 2022-12-24 NOTE — Progress Notes (Addendum)
Asked by Dr. Yetta Barre to assist with filling two week supply of medications into pill boxes.   With the assistance of, Nils Pyle PharmD, PGY-1 Pharmacy Resident we were able to fill two week supply of all medications appropriate for the pill box.  Notes:  She did not have famotidine She is taking pantoprazole 40mg  BID  (also has a small supply of omeprazole which she keeps for use if she runs out of pantoprazole - she is NOT taking both).   Updates RE medication dosing: Risperidone dosing is now 1.5mg  in the AM and 3mg  in the PM Cyclosporin dosing is now 100mg  BID No longer taking mycophenolate - per patient.

## 2022-12-24 NOTE — Progress Notes (Signed)
    SUBJECTIVE:   CHIEF COMPLAINT / HPI:   Need for home health Needs new home health RN as [facility] is not accepting her insurance. Needs home health to aid in filling pill boxes weekly and drawing blood in setting of being transplant recipient and having intellectual disability.  Currently lives alone but her uncle/care taker lives next door.   Cough Occurs with the hot weather.  Think d/t her asthma. Is better after using the albuterol nebulizer. Not using it every day but usually when it is over 90 degrees out side.  Uses albuterol inhaler at night ~ 2x/month. Also using spiriva daily, singulair at bedtime, and zyrtec Follows with allergist, plans to see them in September.  No fevers or other sick symptoms   PERTINENT  PMH / PSH: Asthma, GERD, primary biliary cirrhosis s/p transplant, seizure disorder, schizophrenia, intellectual disability  OBJECTIVE:   BP 117/77   Pulse 93   Ht 4\' 11"  (1.499 m)   Wt 227 lb 3.2 oz (103.1 kg)   LMP 12/02/2022   SpO2 100%   BMI 45.89 kg/m    General: NAD, pleasant, able to participate in exam Cardiac: RRR, no murmurs. Respiratory: CTAB, normal effort, No wheezes, rales or rhonchi Skin: warm and dry Neuro: alert, no obvious focal deficits Psych: Normal affect and mood  ASSESSMENT/PLAN:   Asthma with allergic rhinitis Cough most likely related to asthma. No sick symptoms and lung exam reassuring.  -Continue current regimen. 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 Her medication list is extensive and she is unable to manage her medications alone. She also needs blood drawn frequently as she is s/p liver transplant. Our pharmacy team was able to fill her pill boxes for the next 2 weeks until she is able to get a home health RN.  -Home health order placed.      Dr. [name], DO [facility]

## 2022-12-25 DIAGNOSIS — I10 Essential (primary) hypertension: Secondary | ICD-10-CM | POA: Diagnosis not present

## 2022-12-26 DIAGNOSIS — I10 Essential (primary) hypertension: Secondary | ICD-10-CM | POA: Diagnosis not present

## 2022-12-27 ENCOUNTER — Other Ambulatory Visit: Payer: Medicaid Other | Admitting: *Deleted

## 2022-12-27 DIAGNOSIS — I10 Essential (primary) hypertension: Secondary | ICD-10-CM | POA: Diagnosis not present

## 2022-12-28 DIAGNOSIS — I10 Essential (primary) hypertension: Secondary | ICD-10-CM | POA: Diagnosis not present

## 2022-12-29 DIAGNOSIS — I10 Essential (primary) hypertension: Secondary | ICD-10-CM | POA: Diagnosis not present

## 2022-12-30 DIAGNOSIS — I10 Essential (primary) hypertension: Secondary | ICD-10-CM | POA: Diagnosis not present

## 2022-12-31 DIAGNOSIS — I10 Essential (primary) hypertension: Secondary | ICD-10-CM | POA: Diagnosis not present

## 2023-01-01 ENCOUNTER — Other Ambulatory Visit: Payer: Self-pay | Admitting: Student

## 2023-01-01 DIAGNOSIS — I10 Essential (primary) hypertension: Secondary | ICD-10-CM | POA: Diagnosis not present

## 2023-01-01 DIAGNOSIS — E038 Other specified hypothyroidism: Secondary | ICD-10-CM

## 2023-01-02 ENCOUNTER — Other Ambulatory Visit: Payer: MEDICAID | Admitting: *Deleted

## 2023-01-02 ENCOUNTER — Ambulatory Visit (INDEPENDENT_AMBULATORY_CARE_PROVIDER_SITE_OTHER): Payer: MEDICAID | Admitting: Neurology

## 2023-01-02 DIAGNOSIS — G40909 Epilepsy, unspecified, not intractable, without status epilepticus: Secondary | ICD-10-CM | POA: Diagnosis not present

## 2023-01-02 DIAGNOSIS — F79 Unspecified intellectual disabilities: Secondary | ICD-10-CM

## 2023-01-03 ENCOUNTER — Ambulatory Visit: Payer: Medicaid Other | Admitting: Family Medicine

## 2023-01-03 DIAGNOSIS — I10 Essential (primary) hypertension: Secondary | ICD-10-CM | POA: Diagnosis not present

## 2023-01-04 DIAGNOSIS — I10 Essential (primary) hypertension: Secondary | ICD-10-CM | POA: Diagnosis not present

## 2023-01-05 DIAGNOSIS — I10 Essential (primary) hypertension: Secondary | ICD-10-CM | POA: Diagnosis not present

## 2023-01-17 ENCOUNTER — Ambulatory Visit: Payer: MEDICAID | Admitting: Family Medicine

## 2023-01-17 ENCOUNTER — Encounter: Payer: Self-pay | Admitting: Pharmacist

## 2023-01-17 ENCOUNTER — Ambulatory Visit (INDEPENDENT_AMBULATORY_CARE_PROVIDER_SITE_OTHER): Payer: MEDICAID | Admitting: Family Medicine

## 2023-01-17 ENCOUNTER — Ambulatory Visit: Payer: MEDICAID | Admitting: Pharmacist

## 2023-01-17 VITALS — Ht 59.0 in | Wt 230.0 lb

## 2023-01-17 DIAGNOSIS — Z944 Liver transplant status: Secondary | ICD-10-CM | POA: Diagnosis not present

## 2023-01-17 NOTE — Patient Instructions (Addendum)
Nutrition Goals:  Remember your goal for carbohydrate servings is : 2 per meal = 6 per day = 180g carbs per day  1. Limit starchy foods to TWO portions (servings) per meal and ONE per snack. ONE portion of a starchy food is equal to the following:  - ONE slice of bread (or its equivalent, such as half of a hamburger bun).  - 1/2 cup of a "scoopable" starchy food such as potatoes or rice.  - 15 grams of Total Carbohydrate as shown on food label.  - 4 ounces of a sweet drink (including fruit juice).  Remember sources of carbohydrate (starch):   Carbohydrate includes starch, sugar, and fiber.  Of these, only sugar and starch raise blood glucose.  (Fiber is found in fruits, vegetables [especially skin, seeds, and stalks], whole grains, and beans.)    Starchy (carb) foods: Bread, rice, pasta, potatoes, corn, cereal, grits, crackers, bagels, muffins, all baked goods.  (Fruit, milk, and yogurt also have carbohydrate, but most of these foods will not spike your blood sugar as most starchy or sweet foods will.)  A few fruits do cause high blood sugars; use small portions of bananas (limit to 1/2 at a time), grapes, watermelon, and oranges.    Protein foods: Meat, fish, poultry, eggs, dairy foods, and beans such as pinto and kidney beans (beans also provide carbohydrate).    2. Include vegetables at both lunch & dinner.  (1 serving =  cup cooked veg or 1 cup of salad).    Make sure to set an alarm or reminder to help remind you to add vegetables to 2 of your meals for example: alarms set at 12pm and 4pm.   3. Go to bed no later than 10 PM, and get up no later than 6 AM every day.  4. Please reach out to your Uncle to start your home exercise regimen.   Your follow-up appointment is Thursday October 10th at 2pm.

## 2023-01-17 NOTE — Progress Notes (Signed)
Primary concerns today: Weight management and Liver transplant recipient .   Relevant history/background: Alsha has been told by her liver transplant doctor that weight loss is imperative, although her weight has remained stable or risen over the past several years (was ~200 lb in 2019 and early 2020; currently ~228 lb).    Progress towards goals from last time: Roughly going to sleep at 10pm and getting up at 4am-5am. Feels that she is more sleepy with this.   Calculating number of carbs she takes each day. Using carb tracker. Patient brought lap top to show carb tracker. Also brought in goal tracker on paper with total carb amounts per day. On reviewing both of these there are inconsistencies in comparison to one another.   On review of meals in carb tracker patient is often having 3-6 carb portions per dinner.  Taking medications at what time of day? Taking liver medications earlier at 4pm. Taking other medications at 9pm. Talked with Liver Doctor about that.  Has not started home exercise program yet. Plans to check in with Uncle about that as well.   Has not made significant process in having 2 servings per day of vegetables. Has enough at home. Usually has frozen vegetables. States she does not think of it for lunch or dinner. Does not want to have to them too often.  Assessment:  Has not made significant progress in regards to weight loss. Understanding and tracking carbohydrate intake remains challenging given patient's cognitive deficits. Continued education and teach back performed today regarding carbohydrate intake. Suspect amount of carbohydrate and calories is larger than indicated by tracker and food diary brought in by patient. Set reminder in phone today to help with eating vegetables with at least 2 meals per day. Patient is doing well in regards to altering sleep habits.    Weight: 230lb today; ht is 59".   Usual eating pattern: 3 meals and 1-2 snacks per day. Usual physical  activity: none, previously working on exercise Rx Sleep:  Previously erratic at last visit. Progress towards goal, Improved  For behavioral goals and recommendations, see Patient Instructions.    Monitoring/Evaluation:  Dietary intake, exercise, and body weight.

## 2023-01-17 NOTE — Progress Notes (Deleted)
    SUBJECTIVE:   CHIEF COMPLAINT / HPI:   ***  PERTINENT  PMH / PSH: ***  OBJECTIVE:   LMP 12/02/2022   ***  ASSESSMENT/PLAN:   There are no diagnoses linked to this encounter. No follow-ups on file.  Celine Mans, MD [facility]

## 2023-01-17 NOTE — Progress Notes (Signed)
    S:     Chief Complaint  Patient presents with   Medication Management    Medication Review and Pill Box Fill    47 y.o. who presents for medication management due to concern of polypharmacy.  PMH is significant for kidney transplant in 2015  Patient was referred and last seen by Primary Care Provider, Dr. Yetta Barre, on 12/24/2022.   Patient arrives in good spirits and presents without any assistance.   Do you know what each of your medicines is for? yes  Within the past 12 months, did you worry whether your food would run out before you got money to buy more? no   O:  Physical Exam Pulmonary:     Effort: Pulmonary effort is normal.  Neurological:     Mental Status: She is alert.     Review of Systems  All other systems reviewed and are negative.    A/P: Polypharmacy - Medication Reconciliation History of Transplant- Medication list reviewed and updated.   Understanding of regimen: good  Understanding of indications: good  Potential of compliance: good  with use of pill box  Written patient instructions and updated medication list provided. Total time in face to face counseling 27 minutes.    Follow up Pharmacist PRN PCP Clinic Visit Mid-September Patient seen with Rickey Primus, PharmD Candidate and Andee Poles, PharmD Candidate.

## 2023-01-17 NOTE — Patient Instructions (Signed)
Keep taking all medications with use of pill box.   Nice to see you today.

## 2023-01-17 NOTE — Assessment & Plan Note (Signed)
Polypharmacy - Medication Reconciliation History of Transplant- Medication list reviewed and updated.   Understanding of regimen: good  Understanding of indications: good  Potential of compliance: good  with use of pill box

## 2023-01-18 ENCOUNTER — Ambulatory Visit: Payer: Medicaid Other | Admitting: Podiatry

## 2023-01-18 NOTE — Progress Notes (Signed)
Reviewed and agree with Dr Koval's plan.   

## 2023-01-22 ENCOUNTER — Ambulatory Visit: Payer: Medicaid Other | Admitting: Podiatry

## 2023-01-24 ENCOUNTER — Ambulatory Visit: Payer: MEDICAID | Admitting: Pharmacist

## 2023-01-25 DIAGNOSIS — F250 Schizoaffective disorder, bipolar type: Secondary | ICD-10-CM | POA: Diagnosis not present

## 2023-01-27 DIAGNOSIS — Z419 Encounter for procedure for purposes other than remedying health state, unspecified: Secondary | ICD-10-CM | POA: Diagnosis not present

## 2023-01-29 ENCOUNTER — Other Ambulatory Visit: Payer: Self-pay | Admitting: Allergy and Immunology

## 2023-01-30 ENCOUNTER — Other Ambulatory Visit: Payer: Self-pay | Admitting: Allergy and Immunology

## 2023-01-30 ENCOUNTER — Ambulatory Visit: Payer: MEDICAID | Admitting: Pharmacist

## 2023-01-31 ENCOUNTER — Telehealth: Payer: Self-pay

## 2023-01-31 ENCOUNTER — Encounter: Payer: Self-pay | Admitting: Pharmacist

## 2023-01-31 ENCOUNTER — Ambulatory Visit (INDEPENDENT_AMBULATORY_CARE_PROVIDER_SITE_OTHER): Payer: Medicaid Other | Admitting: Pharmacist

## 2023-01-31 DIAGNOSIS — F79 Unspecified intellectual disabilities: Secondary | ICD-10-CM | POA: Diagnosis not present

## 2023-01-31 DIAGNOSIS — E038 Other specified hypothyroidism: Secondary | ICD-10-CM

## 2023-01-31 DIAGNOSIS — Z944 Liver transplant status: Secondary | ICD-10-CM | POA: Diagnosis not present

## 2023-01-31 DIAGNOSIS — Z7689 Persons encountering health services in other specified circumstances: Secondary | ICD-10-CM | POA: Diagnosis not present

## 2023-01-31 MED ORDER — PRAZOSIN HCL 2 MG PO CAPS: 2.0000 mg | ORAL_CAPSULE | Freq: Every day | ORAL | 3 refills | Status: DC

## 2023-01-31 MED ORDER — BENZTROPINE MESYLATE 1 MG PO TABS: 1.0000 mg | ORAL_TABLET | Freq: Two times a day (BID) | ORAL | 3 refills | Status: DC

## 2023-01-31 MED ORDER — LEVOTHYROXINE SODIUM 75 MCG PO TABS
75.0000 ug | ORAL_TABLET | Freq: Every day | ORAL | 1 refills | Status: DC
Start: 2023-01-31 — End: 2023-04-05

## 2023-01-31 MED ORDER — MONTELUKAST SODIUM 10 MG PO TABS: 10.0000 mg | ORAL_TABLET | Freq: Every evening | ORAL | 1 refills | Status: DC

## 2023-01-31 MED ORDER — RA VITAMIN D-3 50 MCG (2000 UT) PO CAPS: 1.0000 | ORAL_CAPSULE | Freq: Every day | ORAL | 3 refills | Status: DC

## 2023-01-31 MED ORDER — PANTOPRAZOLE SODIUM 40 MG PO TBEC: 40.0000 mg | DELAYED_RELEASE_TABLET | Freq: Two times a day (BID) | ORAL | 3 refills | Status: DC

## 2023-01-31 MED ORDER — BENZTROPINE MESYLATE 1 MG PO TABS: 1.0000 mg | ORAL_TABLET | Freq: Two times a day (BID) | ORAL | Status: DC

## 2023-01-31 NOTE — Patient Instructions (Addendum)
It was nice to see you today!  Medication Changes:  Bring the cyclosporine, propranolol, and prazosin to your next visit.  Continue all other medication the same.

## 2023-01-31 NOTE — Assessment & Plan Note (Signed)
Intellectual disability and complicated polypharmacy post-translant- Medication Reconciliation - Medication list reviewed and updated.   Understanding of regimen: good  Understanding of indications: good  Potential of compliance: good with use of pill box. 1 week pill box completely filled.  2nd week pill box needs multiple medications for complete fill.  Refills sent for 6 maintenance medications.

## 2023-01-31 NOTE — Progress Notes (Signed)
    S:     Chief Complaint  Patient presents with   Medication Management   47 y.o. who presents for medication management due to concern of polypharmacy.  PMH is significant for kidney transplant in 2015.  Patient was referred and last seen by Primary Care Provider, Dr. Yetta Barre, on 12/24/2022.   Patient arrives in good spirits and presents without any assistance.  Do you know what each of your medicines is for? Yes  Within the past 12 months, did you worry whether your food would run out before you got money to buy more? no   O:  Physical Exam Constitutional:      Appearance: Normal appearance.  Pulmonary:     Effort: Pulmonary effort is normal.  Neurological:     Mental Status: She is alert.  Psychiatric:        Mood and Affect: Mood normal.        Behavior: Behavior normal.        Thought Content: Thought content normal.        Judgment: Judgment normal.     Review of Systems  All other systems reviewed and are negative.   A/P: Intellectual disability and complicated polypharmacy post-translant- Medication Reconciliation - Medication list reviewed and updated.   Understanding of regimen: good  Understanding of indications: good  Potential of compliance: good with use of pill box. 1 week pill box completely filled.  2nd week pill box needs multiple medications for complete fill.  Refills sent for 6 maintenance medications.   Patient labs to be obtained prior to visit in 1 week.  Patient aware of labs to be drawn.  Cyclosporine level, CMET, and Lipid panel  Written patient instructions and updated medication list provided. Total time in face to face counseling 32 minutes.    Follow up Pharmacist 1 week for fill of remaining (2nd week) supply of medications.  PCP Clinic Visit on 02/22/23 with Dr. Yetta Barre Patient seen with Alesia Banda, PharmD Candidate and Andee Poles, PharmD Candidate.

## 2023-01-31 NOTE — Telephone Encounter (Signed)
Patient calls nurse line requesting to make a lab apt.   She reports her medicaid changed and her home health nurse is no longer able to come out to her home.   She reports ~3-6 months her Cyclosporine Level is drawn. Her home health nurse was doing this and she reports it is easier for her to come here vs [name].   She reports she needs an early morning apt before she takes her Cyclosporine.  I went ahead and scheduled patient for labs only on 9/11 at 8:30am.   Will forward to PCP for advisement.

## 2023-02-01 NOTE — Progress Notes (Signed)
Reviewed and agree with Dr Koval's plan.   

## 2023-02-04 ENCOUNTER — Other Ambulatory Visit: Payer: MEDICAID | Admitting: Obstetrics and Gynecology

## 2023-02-04 ENCOUNTER — Ambulatory Visit: Payer: Medicaid Other | Admitting: Podiatry

## 2023-02-04 ENCOUNTER — Encounter: Payer: Self-pay | Admitting: Podiatry

## 2023-02-04 ENCOUNTER — Other Ambulatory Visit: Payer: Self-pay | Admitting: Student

## 2023-02-04 DIAGNOSIS — M79675 Pain in left toe(s): Secondary | ICD-10-CM

## 2023-02-04 DIAGNOSIS — D696 Thrombocytopenia, unspecified: Secondary | ICD-10-CM

## 2023-02-04 DIAGNOSIS — B351 Tinea unguium: Secondary | ICD-10-CM | POA: Diagnosis not present

## 2023-02-04 DIAGNOSIS — M79674 Pain in right toe(s): Secondary | ICD-10-CM | POA: Diagnosis not present

## 2023-02-04 DIAGNOSIS — Z7689 Persons encountering health services in other specified circumstances: Secondary | ICD-10-CM | POA: Diagnosis not present

## 2023-02-04 DIAGNOSIS — Z944 Liver transplant status: Secondary | ICD-10-CM

## 2023-02-04 NOTE — Progress Notes (Signed)
This patient returns to the office for evaluation and treatment of long thick painful nails .  This patient is unable to trim her own nails since the patient cannot reach her feet.  Patient says the nails are painful walking and wearing her shoes.  She returns for preventive foot care services.  General Appearance  Alert, conversant and in no acute stress.  Vascular  Dorsalis pedis and posterior tibial  pulses are palpable  bilaterally.  Capillary return is within normal limits  bilaterally. Temperature is within normal limits  bilaterally.  Neurologic  Senn-Weinstein monofilament wire test within normal limits  bilaterally. Muscle power within normal limits bilaterally.  Nails Thick disfigured discolored nails with subungual debris  from hallux to fifth toes bilaterally. No evidence of bacterial infection or drainage bilaterally.  Orthopedic  No limitations of motion  feet .  No crepitus or effusions noted.  No bony pathology or digital deformities noted.  Skin  normotropic skin with no porokeratosis noted bilaterally.  No signs of infections or ulcers noted.     Onychomycosis  Pain in toes right foot  Pain in toes left foot  Debridement  of nails  1-5  B/L with a nail nipper.  Nails were then filed using a dremel tool with no incidents.    RTC 4 months    Gregory Mayer DPM  

## 2023-02-04 NOTE — Progress Notes (Signed)
Pt is liver transplant recipient and needs routine monitoring labs which have been faxed over, requested by [facility] Transplant provider.   Future labs orders placed for CMP, CBC, and cyclosporine A trough. Pt has apt on 9/11 at 8:30 am. Called patient and left voice mail to hold cyclosporin until after lab draw for purpose of getting trough.

## 2023-02-05 NOTE — Procedures (Signed)
   HISTORY: 47 year old female with seizure disorder, intellectual disability  TECHNIQUE:  This is a routine 16 channel EEG recording with one channel devoted to a limited EKG recording.  It was performed during wakefulness, drowsiness and asleep. Photic stimulation were performed as activating procedures.  There are frequent muscle  artifact noted.  Upon maximum arousal, posterior dominant waking rhythm consistent of mildly dysrhythmic theta range activity. Activities are symmetric over the bilateral posterior derivations and attenuated with eye opening.  Photic stimulation did not alter the tracing.  Hyperventilation was not performed  During EEG recording, patient developed drowsiness and entered no deeper stage of sleep was achieved  During EEG recording, there was no epileptiform discharge noted.  EKG demonstrate normal sinus rhythm.  CONCLUSION: This is a mild abnormal EEG.  There is electrodiagnostic evidence of mild background slowing, indicating mild bihemispheric malfunction.  Levert Feinstein, M.D. Ph.D.  [facility] [address] [place], [place] [address] Phone: [phone] Fax:      [phone]

## 2023-02-06 ENCOUNTER — Other Ambulatory Visit: Payer: Medicaid Other

## 2023-02-07 ENCOUNTER — Encounter: Payer: Self-pay | Admitting: Pharmacist

## 2023-02-07 ENCOUNTER — Other Ambulatory Visit (HOSPITAL_COMMUNITY): Payer: Self-pay

## 2023-02-07 ENCOUNTER — Ambulatory Visit (INDEPENDENT_AMBULATORY_CARE_PROVIDER_SITE_OTHER): Payer: Medicaid Other | Admitting: Pharmacist

## 2023-02-07 ENCOUNTER — Other Ambulatory Visit: Payer: Medicaid Other

## 2023-02-07 VITALS — BP 137/78 | HR 85 | Ht 61.0 in | Wt 232.4 lb

## 2023-02-07 DIAGNOSIS — F79 Unspecified intellectual disabilities: Secondary | ICD-10-CM | POA: Diagnosis not present

## 2023-02-07 DIAGNOSIS — Z944 Liver transplant status: Secondary | ICD-10-CM

## 2023-02-07 DIAGNOSIS — Z7689 Persons encountering health services in other specified circumstances: Secondary | ICD-10-CM | POA: Diagnosis not present

## 2023-02-07 MED ORDER — PROPRANOLOL HCL 10 MG PO TABS
10.0000 mg | ORAL_TABLET | Freq: Two times a day (BID) | ORAL | 0 refills | Status: DC
  Filled 2023-02-07: qty 45, 23d supply, fill #0

## 2023-02-07 NOTE — Patient Instructions (Addendum)
It was nice to see you today!  Medication Changes:  Bring doxepin to next visit.   Use the first box for the upcoming week.  Take Thursday AM meds from second box then return to see [place] Thursday  afternoon.   Continue all medication the same.

## 2023-02-07 NOTE — Progress Notes (Signed)
    S:     Chief Complaint  Patient presents with   Medication Management    47 y.o. who presents for medication management due to concern of polypharmacy.  PMH is significant for kidney transplant in 2015.   Patient was referred and last seen by Primary Care Provider, Dr. Yetta Barre, on 12/24/2022.    Patient arrives in good spirits and presents without any assistance.   Do you know what each of your medicines is for? Yes   Within the past 12 months, did you worry whether your food would run out before you got money to buy more? no  Insurance Coverage: [place] Medicaid  O:  Physical Exam Constitutional:      Appearance: Normal appearance.  Pulmonary:     Effort: Pulmonary effort is normal.  Neurological:     Mental Status: She is alert.  Psychiatric:        Mood and Affect: Mood normal.        Behavior: Behavior normal.        Thought Content: Thought content normal.        Judgment: Judgment normal.     Review of Systems  All other systems reviewed and are negative.   Vitals:   02/07/23 0920  BP: 137/78  Pulse: 85  SpO2: 100%    A/P: Polypharmacy - Medication Reconciliation - Medication list reviewed and updated.   Understanding of regimen: good  Understanding of indications: good  Potential of compliance: good with the use  of pill box. Pharmacy did not fill propanolol for patient to bring to visit. Sent script to [facility] and picked up supply of 45 to fill patient's medication boxes. 1 week pill box completely filled. 2nd week pill box needs doxepin for complete fill. Instructed patient to pick up doxepin for follow up visit.  Written patient instructions and updated medication list provided. Total time in face to face counseling 37 minutes.    Follow up Pharmacist 1 week for fill of remaining (2nd week) supply of medications. PCP Clinic Visit with Dr. Yetta Barre on 02/22/23 Patient seen with Alesia Banda, PharmD Candidate and Andee Poles,  PharmD Candidate.

## 2023-02-07 NOTE — Assessment & Plan Note (Signed)
Polypharmacy - Medication Reconciliation - Medication list reviewed and updated.   Understanding of regimen: good  Understanding of indications: good  Potential of compliance: good with the use  of pill box. Pharmacy did not fill propanolol for patient to bring to visit. Sent script to [facility] and picked up supply of 45 to fill patient's medication boxes. 1 week pill box completely filled. 2nd week pill box needs doxepin for complete fill. Instructed patient to pick up doxepin for follow up visit.

## 2023-02-08 NOTE — Progress Notes (Signed)
Reviewed and agree with Dr Koval's plan.   

## 2023-02-10 LAB — COMPREHENSIVE METABOLIC PANEL
ALT: 10 IU/L (ref 0–32)
AST: 13 IU/L (ref 0–40)
Albumin: 3.9 g/dL (ref 3.9–4.9)
Alkaline Phosphatase: 75 IU/L (ref 44–121)
BUN/Creatinine Ratio: 15 (ref 9–23)
BUN: 16 mg/dL (ref 6–24)
Bilirubin Total: 0.3 mg/dL (ref 0.0–1.2)
CO2: 22 mmol/L (ref 20–29)
Calcium: 8.8 mg/dL (ref 8.7–10.2)
Chloride: 107 mmol/L — ABNORMAL HIGH (ref 96–106)
Creatinine, Ser: 1.08 mg/dL — ABNORMAL HIGH (ref 0.57–1.00)
Globulin, Total: 3.2 g/dL (ref 1.5–4.5)
Glucose: 103 mg/dL — ABNORMAL HIGH (ref 70–99)
Potassium: 4.8 mmol/L (ref 3.5–5.2)
Sodium: 143 mmol/L (ref 134–144)
Total Protein: 7.1 g/dL (ref 6.0–8.5)
eGFR: 63 mL/min/{1.73_m2} (ref >59)

## 2023-02-10 LAB — CBC
Hematocrit: 33.5 % — ABNORMAL LOW (ref 34.0–46.6)
Hemoglobin: 10.9 g/dL — ABNORMAL LOW (ref 11.1–15.9)
MCH: 29.1 pg (ref 26.6–33.0)
MCHC: 32.5 g/dL (ref 31.5–35.7)
MCV: 89 fL (ref 79–97)
Platelets: 141 10*3/uL — ABNORMAL LOW (ref 150–450)
RBC: 3.75 x10E6/uL — ABNORMAL LOW (ref 3.77–5.28)
RDW: 13.3 % (ref 11.7–15.4)
WBC: 3.8 10*3/uL (ref 3.4–10.8)

## 2023-02-10 LAB — LIPID PANEL
Chol/HDL Ratio: 4.6 ratio — ABNORMAL HIGH (ref 0.0–4.4)
Cholesterol, Total: 209 mg/dL — ABNORMAL HIGH (ref 100–199)
HDL: 45 mg/dL (ref >39)
LDL Chol Calc (NIH): 131 mg/dL — ABNORMAL HIGH (ref 0–99)
Triglycerides: 188 mg/dL — ABNORMAL HIGH (ref 0–149)
VLDL Cholesterol Cal: 33 mg/dL (ref 5–40)

## 2023-02-10 LAB — CYCLOSPORINE: Cyclosporine, LC-MS/MS: 121 ng/mL (ref 100–400)

## 2023-02-12 ENCOUNTER — Ambulatory Visit: Payer: MEDICAID | Admitting: Student

## 2023-02-12 ENCOUNTER — Ambulatory Visit: Payer: Medicaid Other | Admitting: Allergy and Immunology

## 2023-02-13 ENCOUNTER — Other Ambulatory Visit: Payer: Self-pay | Admitting: Student

## 2023-02-13 ENCOUNTER — Other Ambulatory Visit: Payer: Self-pay | Admitting: Family Medicine

## 2023-02-13 DIAGNOSIS — Z1231 Encounter for screening mammogram for malignant neoplasm of breast: Secondary | ICD-10-CM

## 2023-02-14 ENCOUNTER — Ambulatory Visit: Payer: Medicaid Other | Admitting: Pharmacist

## 2023-02-14 ENCOUNTER — Telehealth: Payer: Self-pay | Admitting: Pharmacist

## 2023-02-14 ENCOUNTER — Ambulatory Visit: Payer: Medicaid Other

## 2023-02-14 NOTE — Telephone Encounter (Signed)
Patient contacted for follow-up of missed appointment for medication pill organizer fill in patient with transplant history.   Patient reports she has adequate supply of medications and understands how to add the single missing medication from last partial fill. (Rejection medication supply was in place in second week supply filled last week).  Patient has visit scheduled 9/24   Total time with patient call and documentation of interaction: 13  minutes.

## 2023-02-15 NOTE — Telephone Encounter (Signed)
Reviewed and agree with Dr Koval's plan.   

## 2023-02-19 ENCOUNTER — Ambulatory Visit (INDEPENDENT_AMBULATORY_CARE_PROVIDER_SITE_OTHER): Payer: Medicaid Other

## 2023-02-19 ENCOUNTER — Ambulatory Visit: Payer: Medicaid Other | Admitting: Pharmacist

## 2023-02-19 ENCOUNTER — Telehealth: Payer: Self-pay | Admitting: Student

## 2023-02-19 ENCOUNTER — Encounter: Payer: Self-pay | Admitting: Pharmacist

## 2023-02-19 VITALS — BP 119/67 | HR 86 | Ht 60.5 in | Wt 230.0 lb

## 2023-02-19 DIAGNOSIS — Z23 Encounter for immunization: Secondary | ICD-10-CM

## 2023-02-19 DIAGNOSIS — F79 Unspecified intellectual disabilities: Secondary | ICD-10-CM | POA: Diagnosis not present

## 2023-02-19 DIAGNOSIS — Z7689 Persons encountering health services in other specified circumstances: Secondary | ICD-10-CM | POA: Diagnosis not present

## 2023-02-19 MED ORDER — RA VITAMIN D-3 50 MCG (2000 UT) PO CAPS: 1.0000 | ORAL_CAPSULE | Freq: Every day | ORAL | 3 refills | Status: AC

## 2023-02-19 MED ORDER — PROPRANOLOL HCL 10 MG PO TABS: 10.0000 mg | ORAL_TABLET | Freq: Two times a day (BID) | ORAL | 5 refills | Status: DC

## 2023-02-19 NOTE — Assessment & Plan Note (Signed)
Polypharmacy - Medication Reconciliation - Medication list reviewed and updated.   Understanding of regimen: good  Understanding of indications: good  Potential of compliance: good with the use  of pill box. 1st week has all medications. 2nd week is missing propanolol after Day 1.  Sent script for propanolol to pharmacy. Instructed patient to pick up propanolol for follow up visit.  Follow up Pharmacist 1 week for fill of remaining (2nd week) supply of medications. Written patient instructions and updated medication list provided. Total time in face to face counseling 38 minutes.

## 2023-02-19 NOTE — Patient Instructions (Addendum)
It was nice to see you today!  Medication Changes:  Use your pink/blue pill box.  BRING propanolol to your next visit so that we can fill the second pill box.  Continue all other medication the same.   Keep up the good work with diet and exercise. Aim for a diet full of vegetables, fruit and lean meats (chicken, [place], fish). Try to limit salt intake by eating fresh or frozen vegetables (instead of canned), rinse canned vegetables prior to cooking and do not add any additional salt to meals.

## 2023-02-19 NOTE — Telephone Encounter (Signed)
Patient dropped off transportation forms to be completed. Last DOS was 01/17/23. Placed in [employer].

## 2023-02-19 NOTE — Progress Notes (Signed)
    S:     Chief Complaint  Patient presents with   Medication Management    Medication Management    47 y.o. who presents for medication management due to concern of polypharmacy.  PMH is significant for kidney transplant in 2015.   Patient was referred and last seen by Primary Care Provider, Dr. Yetta Barre, on 12/24/2022.    Patient arrives in good spirits and presents without any assistance.   Do you know what each of your medicines is for? Yes   Within the past 12 months, did you worry whether your food would run out before you got money to buy more? no   Insurance Coverage: [place] Medicaid   O:  Physical Exam Pulmonary:     Effort: Pulmonary effort is normal.  Neurological:     Mental Status: She is alert.  Psychiatric:        Mood and Affect: Mood normal.        Behavior: Behavior normal.        Thought Content: Thought content normal.        Judgment: Judgment normal.     Review of Systems  All other systems reviewed and are negative.  A/P: Polypharmacy - Medication Reconciliation - Medication list reviewed and updated.   Understanding of regimen: good  Understanding of indications: good  Potential of compliance: good with the use  of pill box. 1st week has all medications. 2nd week is missing propanolol after Day 1.  Sent script for propanolol to pharmacy. Instructed patient to pick up propanolol for follow up visit.  Follow up Pharmacist 1 week for fill of remaining (2nd week) supply of medications. Written patient instructions and updated medication list provided. Total time in face to face counseling 39 minutes.    Follow up Pharmacist on 02/26/23 PCP Clinic Visit PRN Patient seen with Andee Poles, PharmD Candidate.

## 2023-02-20 NOTE — Progress Notes (Signed)
Reviewed and agree with Dr Koval's plan.   

## 2023-02-20 NOTE — Telephone Encounter (Signed)
Placed in MDs box to be filled out. [name] Bruna Potter, CMA

## 2023-02-22 ENCOUNTER — Telehealth: Payer: Self-pay

## 2023-02-22 ENCOUNTER — Ambulatory Visit: Payer: MEDICAID | Admitting: Student

## 2023-02-22 NOTE — Progress Notes (Signed)
Patient presents to nurse clinic for flu vaccination. Patient reports that she previously had localized reaction post vaccination. She reports significant arm swelling.  No difficulties breathing, swallowing or any other adverse symptoms. She is unsure if this was related to flu shot or COVID vaccine, as these were administered at the same time. Per chart review this occurred in 2022. Patient has received flu vaccination since then and has tolerated fine.   Precepted with Dr.Pray. Advised that patient can still receive vaccination today. Administered in LD. Patient tolerated injection well, site unremarkable.   Veronda Prude, RN

## 2023-02-22 NOTE — Telephone Encounter (Signed)
Patient requests DME order for nebulizer supplies (tubing, mouth piece, etc)  She has working nebulizer and will just need order placed for replacement supplies.   Will forward to PCP.   Please route to RN team once order is placed.   Veronda Prude, RN

## 2023-02-24 DIAGNOSIS — I10 Essential (primary) hypertension: Secondary | ICD-10-CM | POA: Diagnosis not present

## 2023-02-25 ENCOUNTER — Other Ambulatory Visit: Payer: Self-pay | Admitting: Student

## 2023-02-25 DIAGNOSIS — I10 Essential (primary) hypertension: Secondary | ICD-10-CM | POA: Diagnosis not present

## 2023-02-25 DIAGNOSIS — J45909 Unspecified asthma, uncomplicated: Secondary | ICD-10-CM

## 2023-02-25 NOTE — Progress Notes (Signed)
Pt requests order for new nebulizer supplies: tubing, mouth piece, etc. Order placed. Will route to RN

## 2023-02-26 ENCOUNTER — Telehealth: Payer: Self-pay

## 2023-02-26 ENCOUNTER — Ambulatory Visit: Payer: Medicaid Other | Admitting: Allergy and Immunology

## 2023-02-26 ENCOUNTER — Encounter: Payer: Self-pay | Admitting: Pharmacist

## 2023-02-26 ENCOUNTER — Ambulatory Visit: Payer: Medicaid Other | Admitting: Pharmacist

## 2023-02-26 VITALS — Wt 237.8 lb

## 2023-02-26 DIAGNOSIS — F79 Unspecified intellectual disabilities: Secondary | ICD-10-CM

## 2023-02-26 DIAGNOSIS — Z7689 Persons encountering health services in other specified circumstances: Secondary | ICD-10-CM | POA: Diagnosis not present

## 2023-02-26 NOTE — Telephone Encounter (Signed)
Patient presents to office visit with Dr. Raymondo Band.   During visit patient inquired about paperwork. She reports she gave the paperwork to PCP and PCP called her on Friday 9/27 to get additional details.   I do not see anything up front for patient to pick up.   Will forward to PCP for an update.

## 2023-02-26 NOTE — Patient Instructions (Signed)
It was nice to see you today!  Medication Changes:  None Pill boxes are complete for two months.  Please get refills on  Doxepin Cyclosporin Propranolol

## 2023-02-26 NOTE — Assessment & Plan Note (Signed)
Polypharmacy - Medication Reconciliation in patient with history of liver transplant and intellectual disability.  Medication list reviewed and updated.   Understanding of regimen: good  Understanding of indications: good  Potential of compliance: good with the use of pill box. 2 week pill box supplied - completely full Requested patient to obtain refills for three medications in short supply for next fill.  Liver transplant nursing team continues to look for help with long-term medication assistance with pill boxes.

## 2023-02-26 NOTE — Progress Notes (Signed)
    S:     Chief Complaint  Patient presents with   Medication Management    Medication Pill box Fill - Liver transplant   47 y.o. who presents for medication management due to concern of polypharmacy.  PMH is significant for kidney transplant in 2015.   Patient was referred and last seen by Primary Care Provider, Dr. Yetta Barre, on 12/24/2022.    Patient arrives in good spirits and presents without any assistance.   Do you know what each of your medicines is for? Yes   Within the past 12 months, did you worry whether your food would run out before you got money to buy more? no   Insurance Coverage: [place] Medicaid     O:    Physical Exam Pulmonary:     Effort: Pulmonary effort is normal.  Neurological:     Mental Status: She is alert.  Psychiatric:        Mood and Affect: Mood normal.        Thought Content: Thought content normal.    Review of Systems  All other systems reviewed and are negative.   A/P: Polypharmacy - Medication Reconciliation in patient with history of liver transplant and intellectual disability.  Medication list reviewed and updated.   Understanding of regimen: good  Understanding of indications: good  Potential of compliance: good with the use of pill box. 2 week pill box supplied - completely full Requested patient to obtain refills for three medications in short supply for next fill.  Liver transplant nursing team continues to look for help with long-term medication assistance with pill boxes.   Written patient instructions and updated medication list provided. Total time in face to face counseling 22 minutes.    Follow up Pharmacist 03/07/2023 at similar time to her nutrition visit to assist with ride efficiency for patient.   Patient seen with Caprice Beaver, PharmD Candidate and Shona Simpson, PharmD Candidate.

## 2023-02-27 DIAGNOSIS — I10 Essential (primary) hypertension: Secondary | ICD-10-CM | POA: Diagnosis not present

## 2023-02-27 NOTE — Telephone Encounter (Signed)
Form placed up front for pick up.   Copy made for batch scanning.   Advised we will be closed this afternoon. Patient reports she will have her uncle come and get it tomorrow.

## 2023-02-28 DIAGNOSIS — I10 Essential (primary) hypertension: Secondary | ICD-10-CM | POA: Diagnosis not present

## 2023-03-01 DIAGNOSIS — I10 Essential (primary) hypertension: Secondary | ICD-10-CM | POA: Diagnosis not present

## 2023-03-02 DIAGNOSIS — I10 Essential (primary) hypertension: Secondary | ICD-10-CM | POA: Diagnosis not present

## 2023-03-04 NOTE — Telephone Encounter (Signed)
Community message sent to [employer]. Will await response.   Jozlyn Schatz C January Bergthold, RN  

## 2023-03-04 NOTE — Progress Notes (Signed)
Reviewed and agree with Dr Koval's plan.   

## 2023-03-04 NOTE — Telephone Encounter (Signed)
Receipt confirmed by [employer].   Beyounce Dickens C Christopherjame Carnell, RN  

## 2023-03-06 ENCOUNTER — Encounter: Payer: Self-pay | Admitting: Obstetrics and Gynecology

## 2023-03-06 ENCOUNTER — Ambulatory Visit: Payer: Medicaid Other | Admitting: Obstetrics and Gynecology

## 2023-03-06 VITALS — BP 122/85 | HR 84 | Ht 59.0 in | Wt 233.0 lb

## 2023-03-06 DIAGNOSIS — Z7689 Persons encountering health services in other specified circumstances: Secondary | ICD-10-CM | POA: Diagnosis not present

## 2023-03-06 DIAGNOSIS — N950 Postmenopausal bleeding: Secondary | ICD-10-CM

## 2023-03-06 NOTE — Progress Notes (Signed)
47 y.o. GYN presents for Endo Bx for PMB once in Dec. 2023.

## 2023-03-06 NOTE — Progress Notes (Signed)
Pt here for [facility] due to PMB Pt unable to tolerate the procedure Will obtain pre op clearance and once obtained schedule pt for [facility] in the oR Discussed with pt, she is in agreement.

## 2023-03-07 ENCOUNTER — Ambulatory Visit (INDEPENDENT_AMBULATORY_CARE_PROVIDER_SITE_OTHER): Payer: Medicaid Other | Admitting: Student

## 2023-03-07 ENCOUNTER — Encounter: Payer: Self-pay | Admitting: Pharmacist

## 2023-03-07 ENCOUNTER — Ambulatory Visit: Payer: Medicaid Other

## 2023-03-07 ENCOUNTER — Ambulatory Visit: Payer: Medicaid Other | Admitting: Pharmacist

## 2023-03-07 VITALS — BP 137/72 | HR 85

## 2023-03-07 VITALS — Ht 59.0 in | Wt 234.2 lb

## 2023-03-07 DIAGNOSIS — F200 Paranoid schizophrenia: Secondary | ICD-10-CM

## 2023-03-07 DIAGNOSIS — F79 Unspecified intellectual disabilities: Secondary | ICD-10-CM | POA: Diagnosis not present

## 2023-03-07 DIAGNOSIS — E66813 Obesity, class 3: Secondary | ICD-10-CM

## 2023-03-07 DIAGNOSIS — Z7689 Persons encountering health services in other specified circumstances: Secondary | ICD-10-CM | POA: Diagnosis not present

## 2023-03-07 DIAGNOSIS — K2100 Gastro-esophageal reflux disease with esophagitis, without bleeding: Secondary | ICD-10-CM

## 2023-03-07 DIAGNOSIS — Z6841 Body Mass Index (BMI) 40.0 and over, adult: Secondary | ICD-10-CM | POA: Diagnosis not present

## 2023-03-07 MED ORDER — PROPRANOLOL HCL 10 MG PO TABS: 10.0000 mg | ORAL_TABLET | Freq: Two times a day (BID) | ORAL | 5 refills | Status: DC

## 2023-03-07 MED ORDER — RISPERIDONE 3 MG PO TABS: 1.5000 mg | ORAL_TABLET | Freq: Every day | ORAL | 3 refills | Status: DC

## 2023-03-07 NOTE — Progress Notes (Signed)
Primary concerns today: Weight management and Liver transplant recipient .   Relevant history/background: [name] has been told by her liver transplant doctor that weight loss is imperative, although her weight has remained stable or risen over the past several years (was ~200 lb in 2019 and early 2020; currently ~228 lb). Reports social worker recommended counting carbs.  24-hr recall: (Up at  AM) 8 A B ( AM)- 9A Smoothie, spinach, blue berry, chocolate protein powder, water. 1 cup coffee with flavored cream  Snk ( AM)- No snack  L ( PM)- 10:50 A 4 airfryed chicken tendes with breading, mixed vegetables (2 cups)  Snk ( PM)- No snack  D ( PM)- 6 P had left over mixed vegetables, mini pretzels (~1oz) with sour cream (2 table spoons), cup of black coffee  Snk ( PM)- Another pack of pretzels, bag of microwave popcorn (pre buttered)  Typical day? No. Usually has more protein for dinner, but didn't have protein in the home.  Progress on behavioral goals:  <2 starch/meal:  ? meals/week. Veg's:    6-8 servings per week. Goal is 2 per day. Bedtime:   10 pm, 6 hours of sleep per night. Denies day time naps.  Home ex program: [place] at home with aid, also going for walks, "work and go" exercises. Just started on Friday. Plans to do exercise daily M-F.  Assessment:  [name] has continued using a carb tracker app.  She is also using the paper Goals Tracker form provided at last appt. Has not made significant progress in regards to weight loss. Patient will stop counting carbs as review of her counting is inaccurate and she is restricting vegetables 2/2 carbohydrate counting. Plan to focus on no more than TWO starchy food portions per meal and 1 starch for snack per day. Second goal is to include vegetables at lunch and dinner more consistently. Patient has met her sleep goals. New goal of exercising 15 min per day M-F. She plans to enroll in exercise classes offered through [facility] and Recreation, assisted  patient in completing medical history portion of forms. Additionally, she plans to exercise with her home aid M-F.  Weight: 234 lb 3.2 oz today Usual eating pattern: 3 meals and 1-2 snacks per day.   For behavioral goals and recommendations, see Patient Instructions.    Monitoring/Evaluation:  Dietary intake, exercise, and body weight.

## 2023-03-07 NOTE — Progress Notes (Signed)
    S:     Chief Complaint  Patient presents with   Medication Management    Medication Pill Box Fill - Hx transplant    47 y.o. who presents for medication management due to concern of polypharmacy.  PMH is significant for liver transplant in 2015.   Patient was referred and last seen by Primary Care Provider, Dr. Yetta Barre, on 12/24/2022.    Patient arrives in good spirits and presents without any assistance.  She is also seeing the nutritionist today.   Do you know what each of your medicines is for? Yes   Insurance Coverage: [place] Medicaid  O:  Physical Exam Vitals reviewed.  Pulmonary:     Effort: Pulmonary effort is normal.  Neurological:     Mental Status: She is alert.  Psychiatric:        Mood and Affect: Mood normal.   Review of Systems  All other systems reviewed and are negative.   A/P: Polypharmacy - Medication Reconciliation - Medication list reviewed and updated.   Understanding of regimen: good  Understanding of indications: good  Potential of compliance: good with the use of pill box. TWO full weeks of all prescription and OTC medications was filled into pill boxes.  Refills of medications need for next fill include:    Follow up Pharmacist 2 weeks for next fill of medications. Written patient instructions and updated medication list provided. Total time in face to face counseling 17 minutes  Follow up Pharmacist 2 weeks. PCP Clinic Visit PRN Patient seen with Caprice Beaver, PharmD Candidate and Shona Simpson, PharmD Candidate.

## 2023-03-07 NOTE — Assessment & Plan Note (Signed)
Polypharmacy - Medication Reconciliation - Medication list reviewed and updated.   Understanding of regimen: good  Understanding of indications: good  Potential of compliance: good with the use of pill box. TWO full weeks of all prescription and OTC medications was filled into pill boxes.

## 2023-03-07 NOTE — Progress Notes (Deleted)
Primary concerns today: Weight management and Liver transplant recipient .   Relevant history/background: [name] has been told by her liver transplant doctor that weight loss is imperative, although her weight has remained stable or risen over the past several years (was ~200 lb in 2019 and early 2020; currently ~228 lb). Reports social worker recommended counting carbs.  24-hr recall: (Up at  AM) 8 A B ( AM)- 9A Smoothie, spinach, blue berry, chocolate protein powder, water. 1 cup coffee with flavored cream  Snk ( AM)- No snack  L ( PM)- 10:50 A 4 airfryed chicken tendes with breading, mixed vegetables (2 cups)  Snk ( PM)- No snack  D ( PM)- 6 P had left over mixed vegetables, mini pretzels (~1oz) with sour cream (2 table spoons), cup of black coffee  Snk ( PM)- Another pack of pretzels, bag of microwave popcorn (pre buttered)  Typical day? No. Usually has more protein for dinner, but didn't have protein in the home.  Progress on behavioral goals:  <2 starch/meal:  ? meals/week. Veg's:    6-8 servings per week. Goal is 2 per day. Bedtime:   10 pm, 6 hours of sleep per night. Denies day time naps.  Home ex program: [place] at home with aid, also going for walks, "work and go" exercises. Just started on Friday. Plans to do exercise daily M-F.  Assessment:  [name] has continued using a carb tracker app.  She is also using the paper Goals Tracker form provided at last appt. Has not made significant progress in regards to weight loss. Patient will stop counting carbs as review of her counting is inaccurate and she is restricting vegetables 2/2 carbohydrate counting. Plan to focus on no more than TWO starchy food portions per meal and 1 starch for snack per day. Second goal is to include vegetables at lunch and dinner more consistently. Patient has met her sleep goals. New goal of exercising 15 min per day M-F. She plans to enroll in exercise classes offered through [facility] Re[facility], assisted  patient in completing medical history portion of forms. Additionally, she plans to exercise with her home aid M-F.  Weight: 234 lb 3.2 oz today Usual eating pattern: 3 meals and 1-2 snacks per day.   For behavioral goals and recommendations, see Patient Instructions.    Monitoring/Evaluation:  Dietary intake, exercise, and body weight.

## 2023-03-07 NOTE — Patient Instructions (Addendum)
Behavioral Goals: 1. Limit starchy foods to TWO portions (servings) per meal and ONE per snack. ONE portion of a starchy food is equal to the following:  - ONE slice of bread (or its equivalent, such as half of a hamburger bun).  - 1/2 cup of a "scoopable" starchy food such as potatoes or rice.  - 15 grams of Total Carbohydrate as shown on food label.  - 4 ounces of a sweet drink (including fruit juice).   Remember sources of carbohydrate (starch):  Carbohydrate includes starch, sugar, and fiber.  Of these, only sugar and starch raise blood glucose.  (Fiber is found in fruits, vegetables [especially skin, seeds, and stalks], whole grains, and beans.)     Starchy (carb) foods: Bread, rice, pasta, potatoes, corn, cereal, grits, crackers, bagels, muffins, all baked goods.  (Fruit, milk, and yogurt also have carbohydrate, but most of these foods will not spike your blood sugar as most starchy or sweet foods will.)  A few fruits do cause high blood sugars; use small portions of bananas (limit to 1/2 at a time), grapes, watermelon, and oranges.     Protein foods: Meat, fish, poultry, eggs, dairy foods, and beans such as pinto and kidney beans (beans also provide carbohydrate).    2. Include vegetables at both lunch & dinner.  (1 serving =  cup cooked veg or 1 cup of salad).       - Set an alarm or reminder to help remind you to add vegetables to 2 of your meals, for example: alarms set at 12pm and 4pm.   3. Exercise 15 minutes five times a week M-F.   Please ask your uncle to look for low calorie pop-corn, some options include smart-pop or 100 calorie bags.  Your follow-up appointment is on Thursday October 31 at 2 pm.

## 2023-03-07 NOTE — Patient Instructions (Addendum)
It was nice to see you today!   Medication Changes:   NONE  Both Pill Boxes are full for the next two weeks.    Please pick-up refills for the following medications prior to your next refill.   Propranolol Risperidone

## 2023-03-08 ENCOUNTER — Other Ambulatory Visit: Payer: Self-pay | Admitting: Student

## 2023-03-08 DIAGNOSIS — J45909 Unspecified asthma, uncomplicated: Secondary | ICD-10-CM

## 2023-03-08 NOTE — Progress Notes (Signed)
DME order for replacement nebulizer placed for asthma.

## 2023-03-10 DIAGNOSIS — I10 Essential (primary) hypertension: Secondary | ICD-10-CM | POA: Diagnosis not present

## 2023-03-11 DIAGNOSIS — J302 Other seasonal allergic rhinitis: Secondary | ICD-10-CM | POA: Diagnosis not present

## 2023-03-11 DIAGNOSIS — I10 Essential (primary) hypertension: Secondary | ICD-10-CM | POA: Diagnosis not present

## 2023-03-12 ENCOUNTER — Ambulatory Visit
Admission: RE | Admit: 2023-03-12 | Discharge: 2023-03-12 | Disposition: A | Discharge status: Home / Self Care | Payer: Medicaid Other | Source: Ambulatory Visit | Attending: Family Medicine | Admitting: Family Medicine

## 2023-03-12 DIAGNOSIS — Z1231 Encounter for screening mammogram for malignant neoplasm of breast: Secondary | ICD-10-CM | POA: Diagnosis not present

## 2023-03-12 DIAGNOSIS — I10 Essential (primary) hypertension: Secondary | ICD-10-CM | POA: Diagnosis not present

## 2023-03-12 DIAGNOSIS — Z7689 Persons encountering health services in other specified circumstances: Secondary | ICD-10-CM | POA: Diagnosis not present

## 2023-03-12 NOTE — Progress Notes (Signed)
Reviewed and agree with Dr Koval's plan.   

## 2023-03-13 DIAGNOSIS — I10 Essential (primary) hypertension: Secondary | ICD-10-CM | POA: Diagnosis not present

## 2023-03-14 ENCOUNTER — Telehealth: Payer: Self-pay

## 2023-03-14 DIAGNOSIS — I10 Essential (primary) hypertension: Secondary | ICD-10-CM | POA: Diagnosis not present

## 2023-03-14 NOTE — Telephone Encounter (Signed)
Patient called back to have surgery date changed to 04/23/23. Patient states she will not have access to transportation on 04/15/23. Confirmed I would schedule patient on 04/23/23 @[facility] at 1 pm and patient must arrive at 11 am. Patient confirmed understanding.

## 2023-03-14 NOTE — Telephone Encounter (Signed)
Called patient to schedule procedure with Dr. Berton Lan. Patient chose to have surgery on 04/15/23 @[facility]  at 10 am. Patient is aware she must arrive at 8 am. Provided surgery details and pre-op instructions over the phone. Patient asked me to call her uncle Dorene Sorrow to notify him of her procedure date, and time. Dorene Sorrow is listed on the acct and provides transportation for medical visits.

## 2023-03-14 NOTE — Telephone Encounter (Signed)
Called patient uncle to notify him of patient's procedure. Left voicemail advising patient is scheduled for surgery on 04/15/23 @[facility]  and must arrive by 8 am.

## 2023-03-14 NOTE — Telephone Encounter (Signed)
Patient calls nurse line in regards to home health services.   She reports she is needing a home health nurse to fill her pill box and do blood work. She reports she spoke with PCP in regards to getting this set up.   Patient reports she already has an aide through [facility] and would like to receive nursing through them as well.   Advised will send to PCP to place home health referral.   Unsure if she will need an apt with PCP since she already has services, however patient has an upcoming apt with PCP on 11/4.

## 2023-03-15 DIAGNOSIS — I10 Essential (primary) hypertension: Secondary | ICD-10-CM | POA: Diagnosis not present

## 2023-03-15 NOTE — Telephone Encounter (Signed)
I have faxed the [facility] referral to [facility]. Since pt already has a relationship with them I do not expect it will take long for pt to be scheduled.  Clemencia Course

## 2023-03-16 DIAGNOSIS — I10 Essential (primary) hypertension: Secondary | ICD-10-CM | POA: Diagnosis not present

## 2023-03-17 DIAGNOSIS — I10 Essential (primary) hypertension: Secondary | ICD-10-CM | POA: Diagnosis not present

## 2023-03-18 DIAGNOSIS — I10 Essential (primary) hypertension: Secondary | ICD-10-CM | POA: Diagnosis not present

## 2023-03-19 DIAGNOSIS — F250 Schizoaffective disorder, bipolar type: Secondary | ICD-10-CM | POA: Diagnosis not present

## 2023-03-19 DIAGNOSIS — I10 Essential (primary) hypertension: Secondary | ICD-10-CM | POA: Diagnosis not present

## 2023-03-19 DIAGNOSIS — F4310 Post-traumatic stress disorder, unspecified: Secondary | ICD-10-CM | POA: Diagnosis not present

## 2023-03-20 DIAGNOSIS — I10 Essential (primary) hypertension: Secondary | ICD-10-CM | POA: Diagnosis not present

## 2023-03-21 ENCOUNTER — Ambulatory Visit (INDEPENDENT_AMBULATORY_CARE_PROVIDER_SITE_OTHER): Payer: Medicaid Other | Admitting: Pharmacist

## 2023-03-21 ENCOUNTER — Encounter: Payer: Self-pay | Admitting: Pharmacist

## 2023-03-21 VITALS — BP 129/91 | HR 95 | Wt 238.6 lb

## 2023-03-21 DIAGNOSIS — K2100 Gastro-esophageal reflux disease with esophagitis, without bleeding: Secondary | ICD-10-CM | POA: Diagnosis not present

## 2023-03-21 DIAGNOSIS — I10 Essential (primary) hypertension: Secondary | ICD-10-CM | POA: Diagnosis not present

## 2023-03-21 DIAGNOSIS — Z7689 Persons encountering health services in other specified circumstances: Secondary | ICD-10-CM | POA: Diagnosis not present

## 2023-03-21 DIAGNOSIS — F79 Unspecified intellectual disabilities: Secondary | ICD-10-CM

## 2023-03-21 MED ORDER — OMEPRAZOLE 40 MG PO CPDR: 40.0000 mg | DELAYED_RELEASE_CAPSULE | Freq: Every day | ORAL | Status: DC

## 2023-03-21 NOTE — Patient Instructions (Signed)
It was nice to see you today!   Medication Changes:   Omeprazole replaced Pantoprazole Both Pill Boxes are full for the next two weeks.    Please pick-up refills for the following medications prior to your next refill.        cyclosporine

## 2023-03-21 NOTE — Progress Notes (Signed)
    S:      Chief Complaint  Patient presents with   Medication Management    Medication Review - Pill Box Fill    47 y.o. who presents for medication management due to concern of polypharmacy.  PMH is significant for liver transplant in 2015.   Patient was referred and last seen by Primary Care Provider, Dr. Yetta Barre, on 12/24/2022.    Patient arrives in good spirits and presents without any assistance.   Do you know what each of your medicines is for? Yes   Insurance Coverage: [place] Medicaid   O:  Physical Exam Vitals reviewed.  Pulmonary:     Effort: Pulmonary effort is normal.  Neurological:     Mental Status: She is alert.  Psychiatric:        Mood and Affect: Mood normal.    Review of Systems  All other systems reviewed and are negative.  A/P: Intellectual disability history and polypharmacy - Medication Reconciliation - Medication list reviewed and updated.  History of Liver transplant.  Omeprazole supply was used to replace pantoprazole for short-term.  Once omeprazole supply is utilized (2 months on hand), a change back to pantoprazole should be considered.  Understanding of regimen: good  Understanding of indications: good  Potential of compliance: good with the use of pill box. TWO full weeks of all prescription and OTC medications was filled into pill boxes. Refills of medications need for next fill include: Cyclosporine     Follow up Pharmacist 2 weeks for next fill of medications.  Written patient instructions and updated medication list provided. Total time in face to face counseling 17 minutes   Follow up Pharmacist 2 weeks on 04/04/2023. PCP Clinic Visit PRN Patient seen with Caprice Beaver, PharmD Candidate and Shona Simpson, PharmD Candidate.

## 2023-03-22 DIAGNOSIS — I10 Essential (primary) hypertension: Secondary | ICD-10-CM | POA: Diagnosis not present

## 2023-03-22 NOTE — Progress Notes (Signed)
Reviewed and agree with Dr Koval's plan.   

## 2023-03-22 NOTE — Assessment & Plan Note (Signed)
Intellectual disability history and polypharmacy - Medication Reconciliation - Medication list reviewed and updated.   Omeprazole supply was used to replace pantoprazole for short-term.  Once omeprazole supply is utilized (2 months on hand), a change back to pantoprazole should be considered.  Understanding of regimen: good  Understanding of indications: good  Potential of compliance: good with the use of pill box. TWO full weeks of all prescription and OTC medications was filled into pill boxes. Refills of medications need for next fill include: Cyclosporine

## 2023-03-23 DIAGNOSIS — I10 Essential (primary) hypertension: Secondary | ICD-10-CM | POA: Diagnosis not present

## 2023-03-24 DIAGNOSIS — I10 Essential (primary) hypertension: Secondary | ICD-10-CM | POA: Diagnosis not present

## 2023-03-25 DIAGNOSIS — I10 Essential (primary) hypertension: Secondary | ICD-10-CM | POA: Diagnosis not present

## 2023-03-26 ENCOUNTER — Other Ambulatory Visit: Payer: Self-pay | Admitting: Student

## 2023-03-26 ENCOUNTER — Other Ambulatory Visit: Payer: Self-pay

## 2023-03-26 ENCOUNTER — Ambulatory Visit (INDEPENDENT_AMBULATORY_CARE_PROVIDER_SITE_OTHER): Payer: Medicaid Other | Admitting: Student

## 2023-03-26 ENCOUNTER — Encounter: Payer: Self-pay | Admitting: Student

## 2023-03-26 VITALS — BP 110/84 | HR 96 | Temp 97.7°F | Ht 59.0 in | Wt 240.6 lb

## 2023-03-26 DIAGNOSIS — Z23 Encounter for immunization: Secondary | ICD-10-CM | POA: Diagnosis not present

## 2023-03-26 DIAGNOSIS — R059 Cough, unspecified: Secondary | ICD-10-CM

## 2023-03-26 DIAGNOSIS — Z7689 Persons encountering health services in other specified circumstances: Secondary | ICD-10-CM | POA: Diagnosis not present

## 2023-03-26 DIAGNOSIS — Z01818 Encounter for other preprocedural examination: Secondary | ICD-10-CM

## 2023-03-26 DIAGNOSIS — I10 Essential (primary) hypertension: Secondary | ICD-10-CM | POA: Diagnosis not present

## 2023-03-26 MED ORDER — FAMOTIDINE 20 MG PO TABS
20.0000 mg | ORAL_TABLET | Freq: Every day | ORAL | 0 refills | Status: DC
Start: 2023-03-26 — End: 2023-03-26

## 2023-03-26 NOTE — Progress Notes (Signed)
    SUBJECTIVE:   CHIEF COMPLAINT / HPI:   Postmenopausal bleeding Patient recently seen by OB/GYN who attempted endometrial biopsy.  Patient was unable to tolerate and they plan for general anesthesia to perform biopsy and need her to be optimized.  Asthma Has apt w/ allergy and asthma on 04/02/2023. Has been wheezing intermittently the past 2 days and taking albuterol every 4 hours but doesn't feel it is helping. Also has a productive cough and feels like her food isn't going down when eating. No nausea, diarrhea or fevers. Feels like this is her asthma flaring up or the start of bronchitis which she sometimes gets in the fall.     PERTINENT  PMH / PSH: ***  OBJECTIVE:   BP 110/84   Pulse 96   Temp 97.7 F (36.5 C) (Oral)   Ht 4\' 11"  (1.499 m)   Wt 240 lb 9.6 oz (109.1 kg)   LMP 12/02/2022   SpO2 97%   BMI 48.60 kg/m    General: NAD, pleasant, able to participate in exam Cardiac: RRR, no murmurs. Respiratory: CTAB, normal effort, No wheezes, rales or rhonchi Abdomen: Bowel sounds present, nontender, nondistended, no hepatosplenomegaly. Extremities: no edema or cyanosis. Skin: warm and dry, no rashes noted Neuro: alert, no obvious focal deficits Psych: Normal affect and mood  ASSESSMENT/PLAN:   No problem-specific Assessment & Plan notes found for this encounter.     Dr. [name], DO [facility]    {    This will disappear when note is signed, click to select method of visit    :1}

## 2023-03-26 NOTE — Patient Instructions (Addendum)
It was great to see you! Thank you for allowing me to participate in your care!  I recommend that you always bring your medications to each appointment as this makes it easy to ensure you are on the correct medications and helps [place] not miss when refills are needed.  Our plans for today:  - Continue to use albuterol as needed - If cough and wheezing worsen and especially if you feel short of breath, please return or go to the emergency department - Go to your upsoming appointment with your asthma doctor - I will reach out to GYN doctor about the endometrial biopsy and if they can try it again without anesthesia. If anesthesia is needed, we need to get your breathing under control first.  ] - I sent a prescription of Pepcid to the pharmacy to see if this helps with cough and feel like food is getting stuck    Take care and seek immediate care sooner if you develop any concerns.   Dr. [name], DO [facility]

## 2023-03-27 DIAGNOSIS — I10 Essential (primary) hypertension: Secondary | ICD-10-CM | POA: Diagnosis not present

## 2023-03-27 DIAGNOSIS — R059 Cough, unspecified: Secondary | ICD-10-CM | POA: Insufficient documentation

## 2023-03-27 DIAGNOSIS — Z01818 Encounter for other preprocedural examination: Secondary | ICD-10-CM | POA: Insufficient documentation

## 2023-03-27 NOTE — Assessment & Plan Note (Addendum)
Vitals and physical exam are reassuring.  She is very well-appearing.  She is breathing very comfortably with no wheezing currently. Given she has history of GERD and is already on Protonix, will add H2 blocker to see if this helps her symptoms.  -Continue maintenance inhalers and albuterol prn -Follow-up with allergy and asthma specialist at scheduled appointment -Return and ED precautions discussed

## 2023-03-27 NOTE — Assessment & Plan Note (Addendum)
She is not on any medications that need to be held prior to [facility] and this is a low risk procedure. I am concerned about current respiratory status given she is needing to use albuterol inhaler more frequently.  Before undergoing intubation, advised patient to follow-up with her asthma and allergy specialist on 04/02/2023.  Will follow-up after this appointment and if asthma is well-controlled I will provide a letter to OB/GYN stating patient has been optimized for D&C.

## 2023-03-28 ENCOUNTER — Ambulatory Visit (INDEPENDENT_AMBULATORY_CARE_PROVIDER_SITE_OTHER): Payer: Medicaid Other | Admitting: Student

## 2023-03-28 VITALS — Ht 59.0 in | Wt 237.6 lb

## 2023-03-28 DIAGNOSIS — E66813 Obesity, class 3: Secondary | ICD-10-CM

## 2023-03-28 DIAGNOSIS — I10 Essential (primary) hypertension: Secondary | ICD-10-CM | POA: Diagnosis not present

## 2023-03-28 DIAGNOSIS — Z7689 Persons encountering health services in other specified circumstances: Secondary | ICD-10-CM | POA: Diagnosis not present

## 2023-03-28 DIAGNOSIS — Z6841 Body Mass Index (BMI) 40.0 and over, adult: Secondary | ICD-10-CM | POA: Diagnosis not present

## 2023-03-28 NOTE — Patient Instructions (Signed)
Behavioral Goals: 1. Limit starchy foods to TWO portions (servings) per meal and ONE per snack. ONE portion of a starchy food is equal to the following:  - ONE slice of bread (or its equivalent, such as half of a hamburger bun).  - 1/2 cup of a "scoopable" starchy food such as potatoes or rice.  - 15 grams of Total Carbohydrate as shown on food label.  - 4 ounces of a sweet drink (including fruit juice).   Remember sources of carbohydrate (starch):  Carbohydrate includes starch, sugar, and fiber.  Of these, only sugar and starch raise blood glucose.  (Fiber is found in fruits, vegetables [especially skin, seeds, and stalks], whole grains, and beans.)     Starchy (carb) foods: Bread, rice, pasta, potatoes, corn, cereal, grits, crackers, bagels, muffins, all baked goods.  (Fruit, milk, and yogurt also have carbohydrate, but most of these foods will not spike your blood sugar as most starchy or sweet foods will.)  A few fruits do cause high blood sugars; use small portions of bananas (limit to 1/2 at a time), grapes, watermelon, and oranges.     Protein foods: Meat, fish, poultry, eggs, dairy foods, and beans such as pinto and kidney beans (beans also provide carbohydrate).    2. Include vegetables at both lunch & dinner.  (1 serving =  cup cooked veg or 1 cup of salad).       - Set an alarm or reminder to help remind you to add vegetables to 2 of your meals, for example: alarms set at 12pm and 4pm.   3. Exercise 30 minutes five days a week.    Please ask your uncle to look for low calorie pop-corn, some options include smart-pop or 100 calorie bags.   Your follow-up appointment is on May 09, 2023 at 2:00 pm

## 2023-03-28 NOTE — Progress Notes (Signed)
Primary concerns today: Weight management and Liver transplant recipient .   Relevant history/background:  [name] has been told by her liver transplant doctor that weight loss is imperative, although her weight has remained stable or risen over the past several years (was ~200 lb in 2019 and early 2020; currently  237 lb). She has a history of asthma with a recent flare, that has made exercise difficult the last 2 days. She is no longer counting carbs in her app.   Progress on behavioral goals:  <2 starch/meal:  2  meals/week. Veg's:    6-8 servings per week. Goal is 2 per day. Home ex program: Walking or jogging in place, minimum of 20 min per day. Has met gaol of 5 days per week, except this week 2/2 asthma flare. She is not yet taking exercise classes, but is working on this goal.  Assessment:   Weight  237 lb 9.6 oz; on 03/07/2023 weight was 234 lb 3.2 oz; Ht is 59 in  24-hr recall: (Up at  AM) 4 AM B ( AM)- 9 AM, oatmeal fruit and cream flavor (1 instant pack, with water), cup of coffee (5 packs of splenda) Snk ( AM)- None L ( PM)- 11: 30 AM. Pork skins, fried (2 ozs). Fish sandwich, air fried, 2 slices of bread, cheese and mayonnaise. Cranberry ginger ale zero Snk ( PM)- None D ( PM)- 4 PM, 1 cup of noodles, carrots, zucchini, squash and broccoli (~ 1 cup of vegetables). Cup of coffee (5 packs of splenda) Snk ( PM)- 9 PM Pork skins (1 oz) Typical day? No, usually has morning and afternoon snack  [name] has made great progress with her exercise goal, but still struggles with carb portions. Today we reviewed carb portions, and requested she bring food labels of common packaged food she eats. She has made progress toward her 2 vegetables per day, with only 1-2 days where she does not meet this goal. She has not lost weight. Plan to focus on no more than TWO starchy food portions per meal and 1 starch for snack per day. Second goal is to include vegetables at lunch and dinner more consistently,  at least 2 portions per day.  New goal of exercising 30 min per day, 5 days per week. She plans to enroll in exercise classes.   Usual eating pattern: 3 meals and 1-2 snacks per day.  For behavioral goals and recommendations, see Patient Instructions.    Monitoring/Evaluation:  Dietary intake, exercise, and body weight.

## 2023-03-29 DIAGNOSIS — I10 Essential (primary) hypertension: Secondary | ICD-10-CM | POA: Diagnosis not present

## 2023-03-29 DIAGNOSIS — Z419 Encounter for procedure for purposes other than remedying health state, unspecified: Secondary | ICD-10-CM | POA: Diagnosis not present

## 2023-03-30 DIAGNOSIS — I10 Essential (primary) hypertension: Secondary | ICD-10-CM | POA: Diagnosis not present

## 2023-03-31 DIAGNOSIS — I10 Essential (primary) hypertension: Secondary | ICD-10-CM | POA: Diagnosis not present

## 2023-04-01 ENCOUNTER — Ambulatory Visit: Payer: Medicaid Other | Admitting: Student

## 2023-04-01 DIAGNOSIS — I10 Essential (primary) hypertension: Secondary | ICD-10-CM | POA: Diagnosis not present

## 2023-04-02 ENCOUNTER — Ambulatory Visit: Payer: Medicaid Other | Admitting: Allergy and Immunology

## 2023-04-02 ENCOUNTER — Other Ambulatory Visit: Payer: Self-pay

## 2023-04-02 ENCOUNTER — Encounter: Payer: Self-pay | Admitting: Allergy and Immunology

## 2023-04-02 VITALS — BP 148/88 | HR 87 | Temp 97.6°F | Ht 60.24 in | Wt 238.9 lb

## 2023-04-02 DIAGNOSIS — L989 Disorder of the skin and subcutaneous tissue, unspecified: Secondary | ICD-10-CM | POA: Diagnosis not present

## 2023-04-02 DIAGNOSIS — J301 Allergic rhinitis due to pollen: Secondary | ICD-10-CM | POA: Diagnosis not present

## 2023-04-02 DIAGNOSIS — J4540 Moderate persistent asthma, uncomplicated: Secondary | ICD-10-CM

## 2023-04-02 DIAGNOSIS — Z7689 Persons encountering health services in other specified circumstances: Secondary | ICD-10-CM | POA: Diagnosis not present

## 2023-04-02 DIAGNOSIS — K219 Gastro-esophageal reflux disease without esophagitis: Secondary | ICD-10-CM | POA: Diagnosis not present

## 2023-04-02 DIAGNOSIS — K2100 Gastro-esophageal reflux disease with esophagitis, without bleeding: Secondary | ICD-10-CM

## 2023-04-02 DIAGNOSIS — I10 Essential (primary) hypertension: Secondary | ICD-10-CM | POA: Diagnosis not present

## 2023-04-02 DIAGNOSIS — J3089 Other allergic rhinitis: Secondary | ICD-10-CM | POA: Diagnosis not present

## 2023-04-02 DIAGNOSIS — R059 Cough, unspecified: Secondary | ICD-10-CM | POA: Diagnosis not present

## 2023-04-02 MED ORDER — FAMOTIDINE 20 MG PO TABS: 20.0000 mg | ORAL_TABLET | Freq: Two times a day (BID) | ORAL | 1 refills | Status: DC

## 2023-04-02 MED ORDER — MONTELUKAST SODIUM 10 MG PO TABS: 10.0000 mg | ORAL_TABLET | Freq: Every evening | ORAL | 1 refills | Status: DC

## 2023-04-02 MED ORDER — FLUTICASONE PROPIONATE 50 MCG/ACT NA SUSP: 1.0000 | Freq: Two times a day (BID) | NASAL | 1 refills | Status: DC

## 2023-04-02 MED ORDER — FLUTICASONE PROPIONATE HFA 110 MCG/ACT IN AERO: 2.0000 | INHALATION_SPRAY | Freq: Two times a day (BID) | RESPIRATORY_TRACT | 1 refills | Status: DC

## 2023-04-02 MED ORDER — SPIRIVA RESPIMAT 1.25 MCG/ACT IN AERS: 2.0000 | INHALATION_SPRAY | Freq: Every morning | RESPIRATORY_TRACT | 1 refills | Status: DC

## 2023-04-02 MED ORDER — AZELASTINE HCL 0.1 % NA SOLN: 2.0000 | Freq: Two times a day (BID) | NASAL | 1 refills | Status: DC

## 2023-04-02 MED ORDER — CETIRIZINE HCL 10 MG PO TABS: 10.0000 mg | ORAL_TABLET | Freq: Every day | ORAL | 1 refills | Status: DC | PRN

## 2023-04-02 MED ORDER — ALBUTEROL SULFATE (2.5 MG/3ML) 0.083% IN NEBU: 2.5000 mg | INHALATION_SOLUTION | RESPIRATORY_TRACT | 1 refills | Status: DC | PRN

## 2023-04-02 MED ORDER — OMEPRAZOLE 40 MG PO CPDR: 40.0000 mg | DELAYED_RELEASE_CAPSULE | Freq: Two times a day (BID) | ORAL | 1 refills | Status: DC

## 2023-04-02 MED ORDER — MOMETASONE FUROATE 0.1 % EX CREA: TOPICAL_CREAM | Freq: Two times a day (BID) | CUTANEOUS | 1 refills | Status: DC | PRN

## 2023-04-02 MED ORDER — VENTOLIN HFA 108 (90 BASE) MCG/ACT IN AERS: 2.0000 | INHALATION_SPRAY | RESPIRATORY_TRACT | 1 refills | Status: DC | PRN

## 2023-04-02 MED ORDER — BUDESONIDE-FORMOTEROL FUMARATE 160-4.5 MCG/ACT IN AERO: 2.0000 | INHALATION_SPRAY | Freq: Two times a day (BID) | RESPIRATORY_TRACT | 1 refills | Status: DC

## 2023-04-02 NOTE — Patient Instructions (Addendum)
  1.  Continue Symbicort 160 2 inhalations 2 times per day   2.  Continue Spiriva 1.25 Respimat - 2 inhalations 1 time per day  3.  Continue montelukast 10 mg one tablet once a day  4. Continue nasal Azelastine and fluticasone 1 spray each nostril twice a day  5. Continue cetirizine 10 mg one tablet once a day  6. Continue Omeprazole 40 mg 2 times per day + famotidine 40 mg  7. If needed:  A. Albuterol + Fluticasone 110 - 2 inhalations TOGETHER every 4-6 hours B. Albuterol nebulizer + Fluticasone - 2 inhalations TOGETHER every 4-6 hours  B. Mometasone 0.1% cream 1-2 times a day if needed   8. Return to clinic in 6 months or earlier if problem

## 2023-04-02 NOTE — Progress Notes (Unsigned)
[place] - [place] - [place] - [place] - [place]   Follow-up Note  Referring Provider: [name], DO Primary Provider: [name], DO Date of Office Visit: 04/02/2023  Subjective:   [name] (DOB: 20-Jan-1976) is a 47 y.o. 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  I last saw in this clinic 14 August 2022.  She believes that her asthma has been under very good control while using her Symbicort and her Spiriva on a consistent basis but unfortunately over the course of the past 5 days she has developed some wheezing and coughing.  She did need to use some albuterol at the beginning of this episode but she is actually improved and does not need to use out any albuterol at this point in time.  She has no associated fever or ugly sputum production or chest pain or other associated systemic or constitutional symptoms and she does not have any issues with her nose or throat.  Overall she thinks that her asthma was doing very well and she did not require systemic steroid to treat an exacerbation and she rarely used the short acting bronchodilator prior to this recent episode.  Occasionally there is some burning relief smoke exposure from her neighbors that will make her cough.  She believes that her reflux is under very good control on her current plan.  She has been using topical mometasone to her hands once or twice a week which is working quite well.  She did obtain the flu vaccine and COVID-vaccine.  Allergies as of 04/02/2023       Reactions   Tramadol Hcl Itching, Nausea And Vomiting   Causes SEIZURES   Aspirin Nausea And Vomiting   Due to Stomach ulcer   Ibuprofen Other (See Comments)   Due to  stomach ulcer   Penicillins Itching, Swelling, Rash   Breakout  Has patient had a PCN reaction causing immediate rash, facial/tongue/throat swelling, SOB or lightheadedness with hypotension: Yes Has patient had a PCN reaction causing severe rash involving mucus membranes or skin necrosis: Yes Has patient had a PCN reaction that required hospitalization: Yes Has patient had a PCN reaction occurring within the last 10 years: No If all of the above answers are "NO", then may proceed with Cephalosporin use.     Pseudoephedrine Itching, Swelling   Tongue swelling   Sulfa Antibiotics Nausea And Vomiting   Acetaminophen Other (See Comments)   Other reaction(s): elevated LFT's   Latex Itching, Rash   Mucinex Fast-max Nausea And Vomiting        Medication List    Accu-Chek Guide test strip Generic drug: glucose blood USE TO CHECK BLOOD SUGAR EVERY DAY   albuterol (2.5 MG/3ML) 0.083% nebulizer solution Commonly known as: PROVENTIL Take 3 mLs (2.5 mg total) by nebulization every 4 (four) hours as needed for wheezing or shortness of breath. 1 vial via nebulizer every 4-6 hours as needed.   Ventolin HFA 108 (90 Base) MCG/ACT inhaler Generic drug: albuterol Inhale 2 puffs into the lungs every 4 (four) hours as needed for wheezing or shortness of breath.   ammonium lactate 12 % cream Commonly known as: AMLACTIN Apply topically 2 (two) times daily.   azelastine 0.1 % nasal spray Commonly known as: ASTELIN Place 2 sprays into  both nostrils 2 (two) times daily.   benztropine 1 MG tablet Commonly known as: COGENTIN Take 1 tablet (1 mg total) by mouth 2 (two) times daily.   budesonide 0.5 MG/2ML nebulizer solution Commonly known as: PULMICORT Take 2 mLs (0.5 mg total) by nebulization every 4 (four) hours as needed.   budesonide-formoterol 160-4.5 MCG/ACT inhaler Commonly known as: SYMBICORT Inhale 2 puffs into the lungs 2 (two) times daily. Use with spacer and mask.    carboxymethylcellulose 0.5 % Soln Commonly known as: REFRESH PLUS Place 1 drop into both eyes 4 (four) times daily as needed (for dry eyes).   cetirizine 10 MG tablet Commonly known as: ZYRTEC Take 1 tablet (10 mg total) by mouth daily as needed for allergies (Can use an extra dose during flares).   cycloSPORINE modified 100 MG Caps Take 100-125 mg by mouth 2 (two) times daily. Take 100 mg in the AM and 100 mg in the PM   divalproex 500 MG 24 hr tablet Commonly known as: DEPAKOTE ER TAKE 2 TABLETS(1000 MG) BY MOUTH AT BEDTIME   doxepin 25 MG capsule Commonly known as: SINEQUAN TK 1 C PO HS   famotidine 20 MG tablet Commonly known as: PEPCID TAKE 1 TABLET(20 MG) BY MOUTH DAILY   ferrous sulfate 325 (65 FE) MG EC tablet Take 1 tablet by mouth daily.   fluticasone 110 MCG/ACT inhaler Commonly known as: Flovent HFA Inhale 2 puffs into the lungs 2 (two) times daily. Only during asthma flare ups   fluticasone 50 MCG/ACT nasal spray Commonly known as: FLONASE Place 1 spray into both nostrils in the morning and at bedtime.   levothyroxine 75 MCG tablet Commonly known as: SYNTHROID Take 1 tablet (75 mcg total) by mouth daily before breakfast.   lidocaine 4 % Commonly known as: HM Lidocaine Patch Place 1 patch onto the skin daily.   mometasone 0.1 % cream Commonly known as: ELOCON Apply topically 2 (two) times daily as needed. Can use on skin one to two times daily if needed.   montelukast 10 MG tablet Commonly known as: SINGULAIR Take 1 tablet (10 mg total) by mouth at bedtime.   multivitamin tablet Take 1 tablet by mouth daily. 9 am.   Nebulizer/Tubing/Mouthpiece Kit 2 each by Does not apply route as directed.   Nebulizers Misc 1 kit by Does not apply route as directed.   omeprazole 40 MG capsule Commonly known as: PRILOSEC Take 1 capsule (40 mg total) by mouth daily.   polyethylene glycol 17 g packet Commonly known as: MIRALAX / GLYCOLAX Take 17 g by mouth  daily as needed (for constipation). In water or juice   prazosin 2 MG capsule Commonly known as: MINIPRESS Take 1 capsule (2 mg total) by mouth at bedtime.   propranolol 10 MG tablet Commonly known as: INDERAL Take 1 tablet (10 mg total) by mouth 2 (two) times daily.   RA Vitamin D-3 50 MCG (2000 UT) Caps Generic drug: Cholecalciferol Take 1 capsule (2,000 Units total) by mouth daily.   risperiDONE 3 MG tablet Commonly known as: RISPERDAL Take 0.5-1 tablets (1.5-3 mg total) by mouth at bedtime. 1/2 of the 3mg  tablet in AM and one 3mg  tablet in the PM   Spacer/Aero-Hold Chamber Mask Misc Use as directed with inhaler.   Spiriva Respimat 1.25 MCG/ACT Aers Generic drug: Tiotropium Bromide Monohydrate Inhale 2 puffs into the lungs in the morning.    Past Medical History:  Diagnosis Date   Abnormal vaginal bleeding  Allergy    Anemia    Arthritis    right knee   Asthma 09-23-12   Asthma somewhat causing increase wheezing and mild congestion at present   Biliary cirrhosis (HCC) 04/26/11   Constipation    DUB (dysfunctional uterine bleeding)    Fall at home, initial encounter 01/21/2019   GERD (gastroesophageal reflux disease)    Health care maintenance 03/06/2014   Intellectual disability    Left-sided Bell's palsy 12/22/2018   Liver transplant recipient [facility])    Lumbar back pain 04/06/2014   PONV (postoperative nausea and vomiting)    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 seizures   Vitamin D deficiency     Past Surgical History:  Procedure Laterality Date   adenonoidectomy     APPENDECTOMY     CHOLECYSTECTOMY     COLONOSCOPY W/ BIOPSIES     ESOPHAGOGASTRODUODENOSCOPY (EGD) WITH PROPOFOL N/A 10/01/2012   Procedure: ESOPHAGOGASTRODUODENOSCOPY (EGD) WITH PROPOFOL;  Surgeon: Willis Modena, MD;  Location: [facility];  Service: Endoscopy;  Laterality: N/A;    eustacean tubes     EYE MUSCLE SURGERY     LIVER TRANSPLANT      Review of systems negative except as noted in HPI / PMHx or noted below:  Review of Systems  Constitutional: Negative.   HENT: Negative.    Eyes: Negative.   Respiratory: Negative.    Cardiovascular: Negative.   Gastrointestinal: Negative.   Genitourinary: Negative.   Musculoskeletal: Negative.   Skin: Negative.   Neurological: Negative.   Endo/Heme/Allergies: Negative.   Psychiatric/Behavioral: Negative.       Objective:   There were no vitals filed for this visit.        Physical Exam Constitutional:      Appearance: She is not diaphoretic.  HENT:     Head: Normocephalic.     Right Ear: Tympanic membrane, ear canal and external ear normal.     Left Ear: Tympanic membrane, ear canal and external ear normal.     Nose: Nose normal. No mucosal edema or rhinorrhea.     Mouth/Throat:     Pharynx: Uvula midline. No oropharyngeal exudate.  Eyes:     Conjunctiva/sclera: Conjunctivae normal.  Neck:     Thyroid: No thyromegaly.     Trachea: Trachea normal. No tracheal tenderness or tracheal deviation.  Cardiovascular:     Rate and Rhythm: Normal rate and regular rhythm.     Heart sounds: Normal heart sounds, S1 normal and S2 normal. No murmur heard. Pulmonary:     Effort: No respiratory distress.     Breath sounds: Normal breath sounds. No stridor. No wheezing or rales.  Lymphadenopathy:     Head:     Right side of head: No tonsillar adenopathy.     Left side of head: No tonsillar adenopathy.     Cervical: No cervical adenopathy.  Skin:    Findings: No erythema or rash.     Nails: There is no clubbing.  Neurological:     Mental Status: She is alert.     Diagnostics:    Spirometry was performed and demonstrated an FEV1 of 1.70 at 81 % of predicted.  Assessment and Plan:   1. Asthma, moderate persistent, well-controlled   2. Perennial allergic rhinitis   3. Seasonal allergic rhinitis due to  pollen   4. Inflammatory dermatosis   5. LPRD (laryngopharyngeal reflux disease)  1.  Continue Symbicort 160 2 inhalations 2 times per day   2.  Continue Spiriva 1.25 Respimat - 2 inhalations 1 time per day  3.  Continue montelukast 10 mg one tablet once a day  4. Continue nasal Azelastine and fluticasone 1 spray each nostril twice a day  5. Continue cetirizine 10 mg one tablet once a day  6. Continue Omeprazole 40 mg 2 times per day + famotidine 40 mg  7. If needed:  A. Albuterol + Fluticasone 110 - 2 inhalations TOGETHER every 4-6 hours B. Albuterol nebulizer + Fluticasone - 2 inhalations TOGETHER every 4-6 hours  B. Mometasone 0.1% cream 1-2 times a day if needed   8. Return to clinic in 6 months or earlier if problem  Ilianna appears to be doing pretty well although certainly she developed most likely a viral respiratory tract infection giving rise to a slight flare of her asthma and she should be able to get this under control while continuing to use anti-inflammatory agents for airway and using an anti-inflammatory rescue plan as noted above which includes albuterol combined with an inhaled steroid.  And her reflux appears to be under very good control as well on her current plan.  Assuming she does well with this treatment plan we will see her back in this clinic in 6 months or earlier if there is a problem.  Laurette Schimke, MD Allergy / Immunology [facility]

## 2023-04-03 DIAGNOSIS — E66813 Obesity, class 3: Secondary | ICD-10-CM | POA: Insufficient documentation

## 2023-04-03 DIAGNOSIS — I10 Essential (primary) hypertension: Secondary | ICD-10-CM | POA: Diagnosis not present

## 2023-04-04 ENCOUNTER — Encounter: Payer: Self-pay | Admitting: Pharmacist

## 2023-04-04 ENCOUNTER — Ambulatory Visit: Payer: Medicaid Other | Admitting: Pharmacist

## 2023-04-04 ENCOUNTER — Encounter: Payer: Self-pay | Admitting: Allergy and Immunology

## 2023-04-04 VITALS — Wt 236.0 lb

## 2023-04-04 DIAGNOSIS — I10 Essential (primary) hypertension: Secondary | ICD-10-CM | POA: Diagnosis not present

## 2023-04-04 DIAGNOSIS — F200 Paranoid schizophrenia: Secondary | ICD-10-CM

## 2023-04-04 DIAGNOSIS — F79 Unspecified intellectual disabilities: Secondary | ICD-10-CM

## 2023-04-04 MED ORDER — DOXEPIN HCL 25 MG PO CAPS: 25.0000 mg | ORAL_CAPSULE | Freq: Every day | ORAL | 3 refills | Status: DC

## 2023-04-04 MED ORDER — RISPERIDONE 3 MG PO TABS: 1.5000 mg | ORAL_TABLET | Freq: Two times a day (BID) | ORAL | 3 refills | Status: DC

## 2023-04-04 NOTE — Patient Instructions (Signed)
It was nice to see you today!   Medication Changes:   START with pink and blue pill box   Please pick-up refills for the following medications prior to your next refill.        Doxepin Risperidone

## 2023-04-04 NOTE — Assessment & Plan Note (Signed)
Intellectual disability history and polypharmacy - Medication Reconciliation - Medication list reviewed and updated.  History of Liver transplant.  Understanding of regimen: good  Understanding of indications: good  Potential of compliance: good with the use of pill box. TWO full weeks of all prescription and OTC medications was filled into pill boxes (last dose of doxepin missing on Thursday PM dose in two weeks  04/18/2023).  Refills of medications need for next fill include: doxepin and risperidone - new prescription refills sent to pharmacy.

## 2023-04-04 NOTE — Progress Notes (Signed)
      S:            Chief Complaint  Patient presents with   Medication Management      Medication Review - Pill Box Fill     47 y.o. who presents for medication management due to concern of polypharmacy.  PMH is significant for liver transplant in 2015.   Patient was referred and last seen by Primary Care Provider, Dr. Yetta Barre, on 03/26/2023.  She has also recently seen Dr. Lucie Leather, allergy and asthma, who added fluticasone inhaler to her inhaler regimen.    Patient arrives in good spirits and presents without any assistance.   Do you know what each of your medicines is for? Yes   Insurance Coverage: [place] [employer]   O:  Physical Exam Vitals reviewed.  Pulmonary:     Effort: Pulmonary effort is normal.  Neurological:     Mental Status: She is alert.  Psychiatric:        Mood and Affect: Mood normal.    Review of Systems  All other systems reviewed and are negative.   A/P: Intellectual disability history and polypharmacy - Medication Reconciliation - Medication list reviewed and updated.  History of Liver transplant.  Understanding of regimen: good  Understanding of indications: good  Potential of compliance: good with the use of pill box. TWO full weeks of all prescription and OTC medications was filled into pill boxes (last dose of doxepin missing on Thursday PM dose in two weeks  04/18/2023).  Refills of medications need for next fill include: doxepin and risperidone - new prescription refills sent to pharmacy.      Follow up Pharmacist 2 weeks for next fill of medications.  Written patient instructions and updated medication list provided. Total time in face to face counseling 23 minutes   Follow up Pharmacist 2 weeks on 04/18/2023. PCP Clinic Visit PRN Patient seen with Lendon Ka, PharmD Candidate and Shona Simpson, PharmD Candidate.

## 2023-04-05 ENCOUNTER — Other Ambulatory Visit: Payer: Self-pay | Admitting: Student

## 2023-04-05 DIAGNOSIS — I10 Essential (primary) hypertension: Secondary | ICD-10-CM | POA: Diagnosis not present

## 2023-04-05 DIAGNOSIS — E038 Other specified hypothyroidism: Secondary | ICD-10-CM

## 2023-04-06 DIAGNOSIS — I10 Essential (primary) hypertension: Secondary | ICD-10-CM | POA: Diagnosis not present

## 2023-04-08 ENCOUNTER — Telehealth: Payer: Self-pay | Admitting: Student

## 2023-04-08 ENCOUNTER — Other Ambulatory Visit: Payer: Self-pay | Admitting: Obstetrics and Gynecology

## 2023-04-08 DIAGNOSIS — N950 Postmenopausal bleeding: Secondary | ICD-10-CM

## 2023-04-08 NOTE — Telephone Encounter (Signed)
Spoke with pt on the phone regarding optimization for upcomming GYN procedure. Has been using rescue inhalers BID to QID since yesterday in setting of cooler weather which she says in common for her. Still using all her maintenance inhalers as prescribed.   I would like her asthma to be under better control prior  to anesthesia and possible intubation for her upcomming D&C. I will reach out to discuss and request any recommendations from her asthma doctor, Dr. Lucie Leather.

## 2023-04-08 NOTE — Progress Notes (Signed)
Reviewed and agree with Dr Koval's plan.   

## 2023-04-09 ENCOUNTER — Telehealth: Payer: Self-pay | Admitting: Student

## 2023-04-09 NOTE — Telephone Encounter (Signed)
I was able to message with Dr. Lucie Leather, pt's allergist, who recommended oral pred 10 mg for 10 days if asthma was still not under control w/ recent addition of flovent prn for flares. Today, pt states she only had to use rescue inhaler once yesterday (significantly less than previous days). I advised I will check back in a couple days. If she is having to used  rescue inhaler multiple times/day, will send in rx for pred.

## 2023-04-14 DIAGNOSIS — I10 Essential (primary) hypertension: Secondary | ICD-10-CM | POA: Diagnosis not present

## 2023-04-15 ENCOUNTER — Encounter (HOSPITAL_BASED_OUTPATIENT_CLINIC_OR_DEPARTMENT_OTHER): Admission: RE | Payer: Self-pay | Source: Home / Self Care

## 2023-04-15 ENCOUNTER — Ambulatory Visit (HOSPITAL_COMMUNITY)
Admission: RE | Admit: 2023-04-15 | Payer: Medicaid Other | Source: Home / Self Care | Admitting: Obstetrics and Gynecology

## 2023-04-15 DIAGNOSIS — I10 Essential (primary) hypertension: Secondary | ICD-10-CM | POA: Diagnosis not present

## 2023-04-15 SURGERY — DILATATION AND CURETTAGE /HYSTEROSCOPY
Anesthesia: Choice

## 2023-04-16 ENCOUNTER — Telehealth: Payer: Self-pay | Admitting: Neurology

## 2023-04-16 DIAGNOSIS — E66813 Obesity, class 3: Secondary | ICD-10-CM | POA: Diagnosis not present

## 2023-04-16 DIAGNOSIS — D849 Immunodeficiency, unspecified: Secondary | ICD-10-CM | POA: Diagnosis not present

## 2023-04-16 DIAGNOSIS — Z713 Dietary counseling and surveillance: Secondary | ICD-10-CM | POA: Diagnosis not present

## 2023-04-16 DIAGNOSIS — Z6841 Body Mass Index (BMI) 40.0 and over, adult: Secondary | ICD-10-CM | POA: Diagnosis not present

## 2023-04-16 DIAGNOSIS — Z944 Liver transplant status: Secondary | ICD-10-CM | POA: Diagnosis not present

## 2023-04-16 NOTE — Telephone Encounter (Signed)
Pt called wanting to know when she will be getting her EEG results. Pt was informed that when provider releases it to the RN she will receive a call from the RN. Pt verbalized understanding.

## 2023-04-16 NOTE — Telephone Encounter (Signed)
At 12:54 pt left a vm asking to be called with results to her EEG

## 2023-04-17 DIAGNOSIS — I10 Essential (primary) hypertension: Secondary | ICD-10-CM | POA: Diagnosis not present

## 2023-04-18 ENCOUNTER — Ambulatory Visit: Payer: Medicaid Other | Admitting: Pharmacist

## 2023-04-18 ENCOUNTER — Encounter: Payer: Self-pay | Admitting: Pharmacist

## 2023-04-18 VITALS — BP 127/82 | HR 92 | Wt 242.0 lb

## 2023-04-18 DIAGNOSIS — F79 Unspecified intellectual disabilities: Secondary | ICD-10-CM

## 2023-04-18 DIAGNOSIS — I10 Essential (primary) hypertension: Secondary | ICD-10-CM | POA: Diagnosis not present

## 2023-04-18 NOTE — Assessment & Plan Note (Signed)
Intellectual disability history and polypharmacy - Medication Reconciliation - Medication list reviewed and updated.  History of Liver transplant.  Understanding of regimen: good  Understanding of indications: good  Potential of compliance: good with the use of pill box. TWO full weeks of all prescription and OTC medications was filled into pill boxes Refills of medications need for next fill include: Cyclosporine, doxepin, risperidone and prazosin.      Written patient instructions and updated medication list provided. Total time in face to face counseling 27 minutes.

## 2023-04-18 NOTE — Telephone Encounter (Signed)
Call patient and uncle [name], reviewed EEG results with both and both verbalized understanding. No further questions at this time.

## 2023-04-18 NOTE — Patient Instructions (Addendum)
It was nice to see you today   Your goal blood sugar is 80-130 before eating and less than 180 after eating.  Medication Changes: No changed - Continue all medication the same.   Please pickup refills for the following medications prior to your next visit/refill  Cyclosporin, Doxepin, Risperidone and Prazosin

## 2023-04-18 NOTE — Telephone Encounter (Signed)
Please call patient, EEG showed mild background slowing, indicating mild bihemispheric malfunction, this is consistent with her complicated medical history of intellectual disability, liver transplant, seizure disorder, polypharmacy treatment  If she has no clinical recurrent seizure, she may continue current medications,   CONCLUSION: This is a mild abnormal EEG.  There is electrodiagnostic evidence of mild background slowing, indicating mild bihemispheric malfunction.

## 2023-04-18 NOTE — Progress Notes (Signed)
    S:     Chief Complaint  Patient presents with   Medication Management    Medication - Pill Box     47 y.o. who presents for medication management due to concern of polypharmacy.  PMH is significant for liver transplant in 2015.   Patient was referred and last seen by Primary Care Provider, Dr. Yetta Barre, on 03/26/2023.  She has also recently seen Dr. Lucie Leather, allergy and asthma, who added fluticasone inhaler to her inhaler regimen.    Patient arrives in good spirits and presents without any assistance.   Do you know what each of your medicines is for? Yes   Insurance Coverage: [employer] [place] [employer]   O:  Physical Exam Pulmonary:     Effort: Pulmonary effort is normal.  Neurological:     Mental Status: She is alert.  Psychiatric:        Mood and Affect: Mood normal.        Behavior: Behavior normal.     Review of Systems  All other systems reviewed and are negative.   Vitals:   04/18/23 1358  BP: 127/82  Pulse: 92  SpO2: 99%     A/P: Intellectual disability history and polypharmacy - Medication Reconciliation - Medication list reviewed and updated.  History of Liver transplant.  Understanding of regimen: good  Understanding of indications: good  Potential of compliance: good with the use of pill box. TWO full weeks of all prescription and OTC medications was filled into pill boxes Refills of medications need for next fill include: Cyclosporine, doxepin, risperidone and prazosin.      Written patient instructions and updated medication list provided. Total time in face to face counseling 27 minutes.    Follow up Pharmacist 2 weeks for pill box fill.  PCP Clinic Visit PRN Patient seen with Shona Simpson, PharmD Candidate.

## 2023-04-19 ENCOUNTER — Telehealth: Payer: Self-pay | Admitting: Student

## 2023-04-19 DIAGNOSIS — I10 Essential (primary) hypertension: Secondary | ICD-10-CM | POA: Diagnosis not present

## 2023-04-19 NOTE — Telephone Encounter (Signed)
Called pt who states she hasn't had to use albuterol and Flovent inhaler since last week but is about to use it today as she has been coughing in setting of fall weather and neighbors burning stuff. She denies and SOB but was wheezing this morning. States her asthma always flares up during the fall and gets better in the winter. She has been using her maintenance inhalers as prescribed.  She was coughing throughout phone call but speaking in complete sentences.  I would like to see her asthma under good control before being intubated for D&C procedure. I will reach out to OBGYN to see if it is reasonable to wait until late dec/early January to schedule.

## 2023-04-19 NOTE — Progress Notes (Signed)
Reviewed and agree with Dr Koval's plan.   

## 2023-04-20 DIAGNOSIS — I10 Essential (primary) hypertension: Secondary | ICD-10-CM | POA: Diagnosis not present

## 2023-04-21 DIAGNOSIS — I10 Essential (primary) hypertension: Secondary | ICD-10-CM | POA: Diagnosis not present

## 2023-04-22 DIAGNOSIS — I10 Essential (primary) hypertension: Secondary | ICD-10-CM | POA: Diagnosis not present

## 2023-04-23 DIAGNOSIS — I10 Essential (primary) hypertension: Secondary | ICD-10-CM | POA: Diagnosis not present

## 2023-04-24 DIAGNOSIS — I10 Essential (primary) hypertension: Secondary | ICD-10-CM | POA: Diagnosis not present

## 2023-04-26 DIAGNOSIS — I10 Essential (primary) hypertension: Secondary | ICD-10-CM | POA: Diagnosis not present

## 2023-04-27 DIAGNOSIS — I10 Essential (primary) hypertension: Secondary | ICD-10-CM | POA: Diagnosis not present

## 2023-04-28 DIAGNOSIS — I10 Essential (primary) hypertension: Secondary | ICD-10-CM | POA: Diagnosis not present

## 2023-04-29 DIAGNOSIS — I10 Essential (primary) hypertension: Secondary | ICD-10-CM | POA: Diagnosis not present

## 2023-04-30 DIAGNOSIS — I10 Essential (primary) hypertension: Secondary | ICD-10-CM | POA: Diagnosis not present

## 2023-05-01 DIAGNOSIS — I10 Essential (primary) hypertension: Secondary | ICD-10-CM | POA: Diagnosis not present

## 2023-05-02 ENCOUNTER — Encounter: Payer: Self-pay | Admitting: Pharmacist

## 2023-05-02 ENCOUNTER — Ambulatory Visit: Payer: Medicaid Other | Admitting: Pharmacist

## 2023-05-02 VITALS — BP 89/67 | HR 72 | Wt 242.0 lb

## 2023-05-02 DIAGNOSIS — F79 Unspecified intellectual disabilities: Secondary | ICD-10-CM

## 2023-05-02 DIAGNOSIS — I10 Essential (primary) hypertension: Secondary | ICD-10-CM | POA: Diagnosis not present

## 2023-05-02 DIAGNOSIS — Z7689 Persons encountering health services in other specified circumstances: Secondary | ICD-10-CM | POA: Diagnosis not present

## 2023-05-02 NOTE — Patient Instructions (Signed)
It was nice to see you today    Your goal blood sugar is 80-130 before eating and less than 180 after eating.   Medication Changes: No changed - Continue all medication the same.    Please pickup refills for the following medications prior to your next visit/refill   Montelukast (singulair) and divalproex (valproate)

## 2023-05-02 NOTE — Assessment & Plan Note (Signed)
Intellectual disability history and polypharmacy - Medication Reconciliation - Medication list reviewed and updated.  History of Liver transplant.  Understanding of regimen: good  Understanding of indications: good  Potential of compliance: good with the use of pill box. TWO full weeks of all prescription and OTC medications was filled into pill boxes

## 2023-05-02 NOTE — Progress Notes (Signed)
    S:     Chief Complaint  Patient presents with   Medication Management    Med Review and Med Box fill    47 y.o. who presents for medication management due to concern of polypharmacy.  PMH is significant for liver transplant and intellectual disability  Patient was referred and last seen by Primary Care Provider, Dr. Yetta Barre, on 03/26/2023.   Patient arrives in good spirits and presents without any assistance. She inquires if we can fill 4 weeks.   During the visit we determined she has inadequate supply of two medications for 4 week fill.  Only a 2 week fill completed.   Insurance Coverage: Medicaid  Do you know what each of your medicines is for? yes Do you feel that your medications are working for you? yes   O:  Physical Exam Pulmonary:     Effort: Pulmonary effort is normal.  Neurological:     Mental Status: She is alert.  Psychiatric:        Mood and Affect: Mood normal.        Thought Content: Thought content normal.     Review of Systems  All other systems reviewed and are negative.   Vitals:   05/02/23 1434  BP: (!) 89/67  Pulse: 72  SpO2: 100%    A/P: Intellectual disability history and polypharmacy - Medication Reconciliation - Medication list reviewed and updated.  History of Liver transplant.  Understanding of regimen: good  Understanding of indications: good  Potential of compliance: good with the use of pill box. TWO full weeks of all prescription and OTC medications was filled into pill boxes Refills of medications need for next fill include: divalproex and montelukast - patient aware.   Written patient instructions and updated medication list provided. Total time in face to face counseling 34 minutes.     Follow up Pharmacist 2 weeks for pill box fill.  PCP Clinic Visit PRN

## 2023-05-03 DIAGNOSIS — I10 Essential (primary) hypertension: Secondary | ICD-10-CM | POA: Diagnosis not present

## 2023-05-03 NOTE — Progress Notes (Signed)
Reviewed and agree with Dr Koval's plan.   

## 2023-05-04 DIAGNOSIS — I10 Essential (primary) hypertension: Secondary | ICD-10-CM | POA: Diagnosis not present

## 2023-05-05 DIAGNOSIS — I10 Essential (primary) hypertension: Secondary | ICD-10-CM | POA: Diagnosis not present

## 2023-05-06 DIAGNOSIS — I10 Essential (primary) hypertension: Secondary | ICD-10-CM | POA: Diagnosis not present

## 2023-05-07 DIAGNOSIS — I10 Essential (primary) hypertension: Secondary | ICD-10-CM | POA: Diagnosis not present

## 2023-05-08 DIAGNOSIS — I10 Essential (primary) hypertension: Secondary | ICD-10-CM | POA: Diagnosis not present

## 2023-05-09 ENCOUNTER — Ambulatory Visit (INDEPENDENT_AMBULATORY_CARE_PROVIDER_SITE_OTHER): Payer: Medicaid Other | Admitting: Family Medicine

## 2023-05-09 VITALS — Ht 59.0 in | Wt 240.2 lb

## 2023-05-09 DIAGNOSIS — Z6841 Body Mass Index (BMI) 40.0 and over, adult: Secondary | ICD-10-CM

## 2023-05-09 DIAGNOSIS — Z944 Liver transplant status: Secondary | ICD-10-CM | POA: Diagnosis not present

## 2023-05-09 DIAGNOSIS — E66813 Obesity, class 3: Secondary | ICD-10-CM | POA: Diagnosis not present

## 2023-05-09 DIAGNOSIS — I10 Essential (primary) hypertension: Secondary | ICD-10-CM | POA: Diagnosis not present

## 2023-05-09 NOTE — Progress Notes (Unsigned)
Primary concerns today: Weight management and Liver transplant recipient.    Relevant history/background:  Shari has been told by her liver transplant doctor that weight loss is imperative, although her weight has remained stable or risen over the past several years (was ~200 lb in 2019 and early 2020; currently 240 lb). She has a history of asthma, which sometimes makes exercise difficult; however, she is now doing exercise indoors which makes it easier.  Patient started with a new dietician last Friday, about a week ago, through which she has been logging all of her food intake and tracking specific grams of macro nutrients. She has a follow up in one week. She says her insurance pays for this program. She has also started making more meals with her new Mediterranean cookbooks. She feels like this process works for her.   Progress on behavioral goals:  <2 starch/meal:          Meeting goal at 70% meals/week. Veg's:                          Getting 6-8 servings per week. (Goal is 2 per day.) Home ex program:    Walking aerobics exercises 5 days a week with her aide (in two 15-minute increments postprandially). She is planning to start exercising at the gym as well.   Assessment:  Russie was last seen in Nutrition Clinic by Dr. Claudean Severance on 03/28/23.  She has started using an Risk analyst, offered through her health insurance.   Weight  240 lb; on 03/28/2023 weight was 237.6; ht is 59 in.  24-hr recall suggests intake of *** kcal:  (Up at 9 AM)  B (10 AM)- one packet of butter flavor instant grits, 1/4 cup cheese   Snk ( AM)-  N/a L (11 AM)- 2 slices of garlic bread (homemade), 1 beef patty, fat free salad dressing    Snk ( PM)-  N/a  D (4 PM)- 2 slices garlic bread (homemade), macaroni noodles with 1 cup diced tomatoes, 3 meatballs   Snk (9 PM)- 1 oz almonds Typical day? No. Says it was a special day yesterday, she ran out of her other vegetables. The other days she was following recipes from her  mediterranean cookbook Usual eating pattern: 3 meals and no snacks per day   Monitoring/Evaluation:  Dietary intake, exercise, and body weight.

## 2023-05-09 NOTE — Patient Instructions (Signed)
You are doing great! Keep up the good work!   Behavioral Goals: 1. Limit starchy foods to TWO portions (servings) per meal and ONE per snack. ONE portion of a starchy food is equal to the following:  - ONE slice of bread (or its equivalent, such as half of a hamburger bun).  - 1/2 cup of a "scoopable" starchy food such as potatoes or rice.  - 15 grams of Total Carbohydrate as shown on food label.  - 4 ounces of a sweet drink (including fruit juice).   Remember sources of carbohydrate (starch):  Carbohydrate includes starch, sugar, and fiber.  Of these, only sugar and starch raise blood glucose.  (Fiber is found in fruits, vegetables [especially skin, seeds, and stalks], whole grains, and beans.)     Starchy (carb) foods: Bread, rice, pasta, potatoes, corn, cereal, grits, crackers, bagels, muffins, all baked goods.  (Fruit, milk, and yogurt also have carbohydrate, but most of these foods will not spike your blood sugar as most starchy or sweet foods will.)  A few fruits do cause high blood sugars; use small portions of bananas (limit to 1/2 at a time), grapes, watermelon, and oranges.     Protein foods: Meat, fish, poultry, eggs, dairy foods, and beans such as pinto and kidney beans (beans also provide carbohydrate).    2. Include at least 2 servings of vegetables per day.  (1 serving =  cup cooked veg or 1 cup of salad).     3. Exercise at least 30 minutes five days a week. Continue your aerobics and follow up with your gym about your membership.    When you check your blood pressure make sure your arm is on the same level of your heart.   Follow-up with your good measures online nutritionist and your dietitian at [facility].  We will no longer have Nutrition Clinic at [facility] after Dr. Gerilyn Pilgrim' retirement.

## 2023-05-10 DIAGNOSIS — I10 Essential (primary) hypertension: Secondary | ICD-10-CM | POA: Diagnosis not present

## 2023-05-11 DIAGNOSIS — I10 Essential (primary) hypertension: Secondary | ICD-10-CM | POA: Diagnosis not present

## 2023-05-12 DIAGNOSIS — I10 Essential (primary) hypertension: Secondary | ICD-10-CM | POA: Diagnosis not present

## 2023-05-13 ENCOUNTER — Other Ambulatory Visit: Payer: Self-pay | Admitting: Allergy and Immunology

## 2023-05-13 DIAGNOSIS — I10 Essential (primary) hypertension: Secondary | ICD-10-CM | POA: Diagnosis not present

## 2023-05-14 DIAGNOSIS — I10 Essential (primary) hypertension: Secondary | ICD-10-CM | POA: Diagnosis not present

## 2023-05-15 DIAGNOSIS — I10 Essential (primary) hypertension: Secondary | ICD-10-CM | POA: Diagnosis not present

## 2023-05-16 ENCOUNTER — Encounter: Payer: Self-pay | Admitting: Pharmacist

## 2023-05-16 ENCOUNTER — Telehealth: Payer: Self-pay | Admitting: Neurology

## 2023-05-16 ENCOUNTER — Ambulatory Visit: Payer: Medicaid Other | Admitting: Pharmacist

## 2023-05-16 VITALS — BP 118/56 | HR 86 | Wt 243.0 lb

## 2023-05-16 DIAGNOSIS — F79 Unspecified intellectual disabilities: Secondary | ICD-10-CM | POA: Diagnosis not present

## 2023-05-16 DIAGNOSIS — I10 Essential (primary) hypertension: Secondary | ICD-10-CM | POA: Diagnosis not present

## 2023-05-16 NOTE — Progress Notes (Signed)
    S:     Chief Complaint  Patient presents with   Medication Management    Medication - pill box fill    47 y.o. who presents for medication management due to concern of polypharmacy.  PMH is significant for liver transplant and intellectual disability   Patient was referred and last seen by Primary Care Provider, Dr. Yetta Barre, on 03/26/2023. Most recently seen by Dr. Gerilyn Pilgrim, RD on 05/09/2023.    Patient arrives in good spirits and presents without any assistance.   During this visit we attempted to fill three week supply.  She had all medications for 3 week supply with exception of famotidine which she takes BID  Insurance Coverage: Medicaid   Do you know what each of your medicines is for? yes Do you feel that your medications are working for you? yes    O:  Physical Exam Constitutional:      Appearance: Normal appearance.  Pulmonary:     Effort: Pulmonary effort is normal.  Neurological:     Mental Status: She is alert. Mental status is at baseline.  Psychiatric:        Mood and Affect: Mood normal.        Thought Content: Thought content normal.     Review of Systems  All other systems reviewed and are negative.   Vitals:   05/16/23 1529  BP: (!) 118/56  Pulse: 86  SpO2: 100%     A/P: Intellectual disability history and polypharmacy - Medication Reconciliation - Medication list reviewed and updated.  History of Liver transplant.  Understanding of regimen: good  Understanding of indications: good  Potential of compliance: good with the use of pill box. TWO full weeks of all prescription and OTC medications was filled into pill boxes Third week has all medications with exception of famotidine.  Patient states she believed she could take famotidine from bottle once she picks up from pharmacy.  Refills of medications need for next fill include:  Cetirizine 10mg  Cyclosporine 100mg  Doxepin 25mg  Famotidine 20mg  Risperidone 3mg  - patient aware she needs to  obtain.    Written patient instructions and updated medication list provided. Total time in face to face counseling 38 minutes.     Follow up Pharmacist ~ 3 weeks for pill box fill. (06/04/2023) PCP Clinic Visit PRN

## 2023-05-16 NOTE — Telephone Encounter (Signed)
Completed and placed in bocx for pick up by debra settle

## 2023-05-16 NOTE — Telephone Encounter (Signed)
Pt LVM stating a transportation letter was due to be sent for her and she would like to confirm that it was received. Requesting call back

## 2023-05-16 NOTE — Patient Instructions (Addendum)
It was nice to see you today    Your goal blood sugar is 80-130 before eating and less than 180 after eating.   Medication Changes: No changed - Continue all medication the same.    Please pickup refills for the following medications prior to your next visit/refill   Cetirizine 10mg  Cyclosporine 100mg  Doxepin 25mg  Famotidine 20mg  Risperidone 3mg   Next Visit in about 3 weeks on January 7th Please take famotidine twice daily with your largest pill box.

## 2023-05-16 NOTE — Assessment & Plan Note (Signed)
Intellectual disability history and polypharmacy - Medication Reconciliation - Medication list reviewed and updated.  History of Liver transplant.  Understanding of regimen: good  Understanding of indications: good  Potential of compliance: good with the use of pill box. TWO full weeks of all prescription and OTC medications was filled into pill boxes Third week has all medications with exception of famotidine.  Patient states she believed she could take famotidine from bottle once she picks up from pharmacy.  Refills of medications need for next fill include:  Cetirizine 10mg  Cyclosporine 100mg  Doxepin 25mg  Famotidine 20mg  Risperidone 3mg  - patient aware she needs to obtain.

## 2023-05-17 DIAGNOSIS — I10 Essential (primary) hypertension: Secondary | ICD-10-CM | POA: Diagnosis not present

## 2023-05-17 NOTE — Progress Notes (Signed)
Reviewed and agree with Dr Koval's plan.   

## 2023-05-18 DIAGNOSIS — I10 Essential (primary) hypertension: Secondary | ICD-10-CM | POA: Diagnosis not present

## 2023-05-23 ENCOUNTER — Telehealth: Payer: Self-pay | Admitting: Student

## 2023-05-23 NOTE — Telephone Encounter (Signed)
Called pt who states her asthma had been under good control until she started coughing up brownish tinged mucus yesterday so she has been using her rescue inhaler a couple times/day which helps.  She is speaking in complete sentences. She denies any fevers or SOB currently but is worried she is coming down with a cold. Does not feel like she needs to be seen at this moment but plans to call for an appointment if her symptoms worsen.   Will continue to hold off on intubation for D&C procedure until respiratory status is improved. If she does not come in for apt, I plan to check back in with her in about a week.   I received paperwork from [facility] requesting pt have labs drawn every two weeks and have results faxed to [facility]. However, pt states she only needs the labs done every 3 months since her medications have not changed recently. I will reach out to [name] to get clarification.

## 2023-05-24 ENCOUNTER — Telehealth: Payer: Self-pay | Admitting: Student

## 2023-05-24 NOTE — Telephone Encounter (Signed)
Call patient to inform her that I received an email from Leroy Libman, RN with [name] transplant team stating patient does not need labs every 2 weeks.  They just provided a standing order for when every other week labs are needed in setting of medication changes. I will have this order scanned into her chart.  For now, patient only needs routine labs every 3 months.  Will need CMP, CBC, cyclosporin levels in February 2025.   Discussed with patient that I will plan to call her next week on Friday, 05/31/2023 and if her respiratory status has improved and she is no longer needing rescue inhalers daily, will plan to write her a letter for medical optimization prior to intubation for D&C procedure with OB/GYN.

## 2023-05-30 DIAGNOSIS — I10 Essential (primary) hypertension: Secondary | ICD-10-CM | POA: Diagnosis not present

## 2023-05-31 ENCOUNTER — Telehealth: Payer: Self-pay | Admitting: Student

## 2023-05-31 DIAGNOSIS — I10 Essential (primary) hypertension: Secondary | ICD-10-CM | POA: Diagnosis not present

## 2023-05-31 NOTE — Telephone Encounter (Signed)
 Called pt to check on respiratory status. States she still has a productive cough with brown mucus for the past week and has intermittent wheezing. She is taking her maintenance inhalers for asthma and having to use her rescue inhaler at least once a day which helps. She is concerned she has bronchitis.  She is breathing comfortably and speaking in complete sentences.  She also states she had abdominal pain earlier today followed by nausea and 3 episodes of vomiting. She is no longer having the abdominal pain or vomiting and feels okay. She is unsure if she has a fever. She does think a friend she saw recently had a stomach virus. We discussed ED/urgent care precautions over the weekend, staying hydrated, and only eating small amounts of bland foods when she feels that she can.   Apt scheduled for Tues 06/03/22 with Dr. Zheng

## 2023-06-01 DIAGNOSIS — I10 Essential (primary) hypertension: Secondary | ICD-10-CM | POA: Diagnosis not present

## 2023-06-01 NOTE — Telephone Encounter (Signed)
 error

## 2023-06-02 DIAGNOSIS — I10 Essential (primary) hypertension: Secondary | ICD-10-CM | POA: Diagnosis not present

## 2023-06-03 DIAGNOSIS — I10 Essential (primary) hypertension: Secondary | ICD-10-CM | POA: Diagnosis not present

## 2023-06-04 ENCOUNTER — Ambulatory Visit (INDEPENDENT_AMBULATORY_CARE_PROVIDER_SITE_OTHER): Payer: Medicaid Other | Admitting: Family Medicine

## 2023-06-04 ENCOUNTER — Ambulatory Visit (INDEPENDENT_AMBULATORY_CARE_PROVIDER_SITE_OTHER): Payer: Medicaid Other | Admitting: Pharmacist

## 2023-06-04 ENCOUNTER — Encounter: Payer: Self-pay | Admitting: Family Medicine

## 2023-06-04 VITALS — BP 126/84 | HR 95 | Wt 243.0 lb

## 2023-06-04 VITALS — BP 113/45 | HR 86 | Wt 238.8 lb

## 2023-06-04 DIAGNOSIS — J4550 Severe persistent asthma, uncomplicated: Secondary | ICD-10-CM

## 2023-06-04 DIAGNOSIS — F79 Unspecified intellectual disabilities: Secondary | ICD-10-CM

## 2023-06-04 DIAGNOSIS — I10 Essential (primary) hypertension: Secondary | ICD-10-CM | POA: Diagnosis not present

## 2023-06-04 NOTE — Assessment & Plan Note (Signed)
 Intellectual disability history and polypharmacy - Medication Reconciliation - Medication list reviewed and updated.  History of Liver transplant.  Understanding of regimen: good  Understanding of indications: good  Potential of compliance: good with the use of pill box. THREE full weeks of all prescription and OTC medications was filled into pill boxes

## 2023-06-04 NOTE — Patient Instructions (Addendum)
 It was nice to see you today     Medication Changes: No changed - Continue all medication the same.    Please pickup refills for the following medications prior to your next visit/refill   Benztropine  1mg  Cyclosporine  100mg  Doxepin  25mg  Prazosin  2mg  Propranolol  10mg 

## 2023-06-04 NOTE — Patient Instructions (Signed)
 Good to see you today - Thank you for coming in  Things we discussed today:  1) It sounds like your asthma has been more severe recently due to change in weather. -I recommend that you continue to take your Spiriva , Symbicort , Singulair , and Claritin  regularly.  - If you have exacerbations of your asthma, please take your albuterol  nebulizer treatment every 4 hours as needed. If you are unable to control your symptoms, please seek emergency medical attention - Please out to your allergist.  Please update them on how the medication is been working for you, they may be able to adjust your medication if needed.SABRA

## 2023-06-04 NOTE — Progress Notes (Signed)
    S:     Chief Complaint  Patient presents with   Medication Management    Medication - Box Fill    48 y.o. who presents for medication management due to concern of polypharmacy.  PMH is significant for liver transplant and intellectual disability   Patient was referred and last seen by Primary Care Provider, Dr. Joshua, on 03/26/2023.    Patient arrives in good spirits and presents without any assistance.   During this visit we attempted to fill three week supply.   Insurance Coverage: Medicaid   Do you know what each of your medicines is for? yes Do you feel that your medications are working for you? yes    O:  Physical Exam Vitals reviewed.  Constitutional:      Appearance: Normal appearance.  Pulmonary:     Effort: Pulmonary effort is normal.  Neurological:     Mental Status: She is alert. Mental status is at baseline.  Psychiatric:        Mood and Affect: Mood normal.        Thought Content: Thought content normal.     Review of Systems  Respiratory:  Negative for shortness of breath and wheezing.   All other systems reviewed and are negative.   Vitals:   06/04/23 1353  BP: (!) 113/45  Pulse: 86  SpO2: 100%     A/P: Intellectual disability history and polypharmacy - Medication Reconciliation - Medication list reviewed and updated.  History of Liver transplant.  Understanding of regimen: good  Understanding of indications: good  Potential of compliance: good with the use of pill box. THREE full weeks of all prescription and OTC medications was filled into pill boxes  Refills of medications need for next fill include:  Benztropine  1mg  Cyclosporine  100mg  Doxepin  25mg  Prazosin  2mg  Propranolol  10mg   Written patient instructions and updated medication list provided. Total time in face to face counseling 36 minutes.    Follow up Pharmacist 3 weeks - 1/28 Patient seen with Owens Cowing, PharmD Candidate and Madelaine Darnel, PharmD Candidate.
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
## 2023-06-05 DIAGNOSIS — I10 Essential (primary) hypertension: Secondary | ICD-10-CM | POA: Diagnosis not present

## 2023-06-06 ENCOUNTER — Ambulatory Visit: Payer: Medicaid Other | Admitting: Pharmacist

## 2023-06-06 ENCOUNTER — Ambulatory Visit (INDEPENDENT_AMBULATORY_CARE_PROVIDER_SITE_OTHER): Payer: Medicaid Other | Admitting: Podiatry

## 2023-06-06 ENCOUNTER — Encounter: Payer: Self-pay | Admitting: Podiatry

## 2023-06-06 DIAGNOSIS — B351 Tinea unguium: Secondary | ICD-10-CM

## 2023-06-06 DIAGNOSIS — D696 Thrombocytopenia, unspecified: Secondary | ICD-10-CM | POA: Diagnosis not present

## 2023-06-06 DIAGNOSIS — I10 Essential (primary) hypertension: Secondary | ICD-10-CM | POA: Diagnosis not present

## 2023-06-06 DIAGNOSIS — M79674 Pain in right toe(s): Secondary | ICD-10-CM | POA: Diagnosis not present

## 2023-06-06 DIAGNOSIS — M79675 Pain in left toe(s): Secondary | ICD-10-CM | POA: Diagnosis not present

## 2023-06-06 NOTE — Progress Notes (Signed)
 Reviewed and agree with Dr Macky Lower plan.

## 2023-06-06 NOTE — Progress Notes (Signed)
This patient returns to the office for evaluation and treatment of long thick painful nails .  This patient is unable to trim her own nails since the patient cannot reach her feet.  Patient says the nails are painful walking and wearing her shoes.  She returns for preventive foot care services.  General Appearance  Alert, conversant and in no acute stress.  Vascular  Dorsalis pedis and posterior tibial  pulses are palpable  bilaterally.  Capillary return is within normal limits  bilaterally. Temperature is within normal limits  bilaterally.  Neurologic  Senn-Weinstein monofilament wire test within normal limits  bilaterally. Muscle power within normal limits bilaterally.  Nails Thick disfigured discolored nails with subungual debris  from hallux to fifth toes bilaterally. No evidence of bacterial infection or drainage bilaterally.  Orthopedic  No limitations of motion  feet .  No crepitus or effusions noted.  No bony pathology or digital deformities noted.  Skin  normotropic skin with no porokeratosis noted bilaterally.  No signs of infections or ulcers noted.     Onychomycosis  Pain in toes right foot  Pain in toes left foot  Debridement  of nails  1-5  B/L with a nail nipper.  Nails were then filed using a dremel tool with no incidents.    RTC  3 months   Nitza Schmid DPM   

## 2023-06-07 DIAGNOSIS — I10 Essential (primary) hypertension: Secondary | ICD-10-CM | POA: Diagnosis not present

## 2023-06-10 DIAGNOSIS — I10 Essential (primary) hypertension: Secondary | ICD-10-CM | POA: Diagnosis not present

## 2023-06-11 DIAGNOSIS — I10 Essential (primary) hypertension: Secondary | ICD-10-CM | POA: Diagnosis not present

## 2023-06-12 ENCOUNTER — Telehealth: Payer: Self-pay | Admitting: Student

## 2023-06-12 DIAGNOSIS — I10 Essential (primary) hypertension: Secondary | ICD-10-CM | POA: Diagnosis not present

## 2023-06-12 DIAGNOSIS — J45909 Unspecified asthma, uncomplicated: Secondary | ICD-10-CM

## 2023-06-12 MED ORDER — PREDNISONE 10 MG PO TABS: 10.0000 mg | ORAL_TABLET | Freq: Every day | ORAL | 0 refills | Status: DC

## 2023-06-12 NOTE — Telephone Encounter (Addendum)
 I have contacted pt's asthma doctor, Dr. Kozlow for advice to get her medically optimized for intubation for her upcomming D&C with GYN for post menopausal bleeding given she has been using her rescue inhaler more frequently over the past several weeks in the setting of environmental factors which are out of her control.   Per staff message with Dr. Jerelene Monday: "Yes, the anesthesia will irritate her airway somewhat. You can give her a low dose of prednisone  prior to the procedure. Prednisone  10 mg daily for 5 days. Then she what happens after the procedure. She has been on prolonged prednisone  in the past and has tolerated that administration well. We can see her in clinic if she develops a problem. She just needs to contact the clinic and we will see her that day."  I previously reached out to the [name] liver transplant team who has provided a letter that pt is medically optimized from a liver transplant standpoint and she should continue her medications leading up to the procedure. This letter can be found in photos and will be scanned into her chart.  I called patient who was able to repeat the plan back to me regarding the 5 day course of prednisone  prior to the procedure and also understands to reach out to me or Dr. Kozlow if she is having any respiratory symptoms the days leading up to the procedure despite the prednisone  and to reach out to Dr. Jerelene Monday after the procedure if having issue who will get her in same day.   I will write a letter that she has been medically optimized for this low risk D&C procedure, she does not need to hold any of her medications leading up to it, and that she needs to take the 5 day prednisone  course and fax this letter to the [facility] office. I would recommend CBC and CMP prior to procedure which can be ordered by me or OBGYN.

## 2023-06-12 NOTE — Telephone Encounter (Signed)
 Letter to be faxed to Dr. Donetta Furl regarding optimization for procedure.

## 2023-06-13 DIAGNOSIS — I10 Essential (primary) hypertension: Secondary | ICD-10-CM | POA: Diagnosis not present

## 2023-06-14 DIAGNOSIS — I10 Essential (primary) hypertension: Secondary | ICD-10-CM | POA: Diagnosis not present

## 2023-06-15 DIAGNOSIS — I10 Essential (primary) hypertension: Secondary | ICD-10-CM | POA: Diagnosis not present

## 2023-06-16 DIAGNOSIS — I10 Essential (primary) hypertension: Secondary | ICD-10-CM | POA: Diagnosis not present

## 2023-06-18 DIAGNOSIS — I10 Essential (primary) hypertension: Secondary | ICD-10-CM | POA: Diagnosis not present

## 2023-06-19 ENCOUNTER — Other Ambulatory Visit: Payer: Self-pay | Admitting: Obstetrics and Gynecology

## 2023-06-19 DIAGNOSIS — N950 Postmenopausal bleeding: Secondary | ICD-10-CM

## 2023-06-19 DIAGNOSIS — I10 Essential (primary) hypertension: Secondary | ICD-10-CM | POA: Diagnosis not present

## 2023-06-20 DIAGNOSIS — I10 Essential (primary) hypertension: Secondary | ICD-10-CM | POA: Diagnosis not present

## 2023-06-21 DIAGNOSIS — I10 Essential (primary) hypertension: Secondary | ICD-10-CM | POA: Diagnosis not present

## 2023-06-22 DIAGNOSIS — I10 Essential (primary) hypertension: Secondary | ICD-10-CM | POA: Diagnosis not present

## 2023-06-23 DIAGNOSIS — I10 Essential (primary) hypertension: Secondary | ICD-10-CM | POA: Diagnosis not present

## 2023-06-24 DIAGNOSIS — I10 Essential (primary) hypertension: Secondary | ICD-10-CM | POA: Diagnosis not present

## 2023-06-25 ENCOUNTER — Ambulatory Visit: Payer: Medicaid Other | Admitting: Pharmacist

## 2023-06-25 DIAGNOSIS — I10 Essential (primary) hypertension: Secondary | ICD-10-CM | POA: Diagnosis not present

## 2023-06-26 ENCOUNTER — Telehealth: Payer: Self-pay

## 2023-06-26 DIAGNOSIS — I10 Essential (primary) hypertension: Secondary | ICD-10-CM | POA: Diagnosis not present

## 2023-06-26 NOTE — Telephone Encounter (Signed)
Called patient to let her know Dr. Berton Lan is able to complete her surgery on 07/23/23. I left a voicemail asking patient to call me to confirm surgery [phone]

## 2023-06-27 ENCOUNTER — Telehealth: Payer: Self-pay

## 2023-06-27 DIAGNOSIS — I10 Essential (primary) hypertension: Secondary | ICD-10-CM | POA: Diagnosis not present

## 2023-06-27 NOTE — Telephone Encounter (Signed)
[name] with [employer] LVM on nurse line in regards to [facility] forms.   She reports she faxed these on 1/7, however has not received a response.   I do not see any chart notes on personal care service forms.   Will have [name] send another copy if this has not been completed by PCP.

## 2023-06-28 ENCOUNTER — Other Ambulatory Visit: Payer: Self-pay | Admitting: Obstetrics and Gynecology

## 2023-06-28 DIAGNOSIS — Z01818 Encounter for other preprocedural examination: Secondary | ICD-10-CM

## 2023-06-28 DIAGNOSIS — N950 Postmenopausal bleeding: Secondary | ICD-10-CM

## 2023-06-28 DIAGNOSIS — I10 Essential (primary) hypertension: Secondary | ICD-10-CM | POA: Diagnosis not present

## 2023-06-29 DIAGNOSIS — I10 Essential (primary) hypertension: Secondary | ICD-10-CM | POA: Diagnosis not present

## 2023-06-30 DIAGNOSIS — I10 Essential (primary) hypertension: Secondary | ICD-10-CM | POA: Diagnosis not present

## 2023-07-01 ENCOUNTER — Encounter: Payer: Self-pay | Admitting: Pharmacist

## 2023-07-01 ENCOUNTER — Ambulatory Visit: Payer: Medicaid Other | Admitting: Pharmacist

## 2023-07-01 VITALS — BP 105/69 | HR 106

## 2023-07-01 DIAGNOSIS — F79 Unspecified intellectual disabilities: Secondary | ICD-10-CM | POA: Diagnosis not present

## 2023-07-01 DIAGNOSIS — I10 Essential (primary) hypertension: Secondary | ICD-10-CM | POA: Diagnosis not present

## 2023-07-01 NOTE — Progress Notes (Signed)
    S:     Chief Complaint  Patient presents with   Medication Management    Med Box Fill   48 y.o. female who presents for medication management due to concern of polypharmacy.  PMH is significant for liver transplant 06/2013 and intellectual disability   Patient was referred and last seen by Primary Care Provider, Dr. Sherrilee Gilles, on 06/04/23.    Patient arrives in good spirits and presents without any assistance. We missed our schedule appointment last week and patient reports she took all of her medications as prescribed.   During this visit we attempted to fill three week supply.   Insurance Coverage: Medicaid   Do you know what each of your medicines is for? yes Do you feel that your medications are working for you? yes    O:   Review of Systems  All other systems reviewed and are negative.   Physical Exam Vitals reviewed.  Pulmonary:     Effort: Pulmonary effort is normal.  Neurological:     Mental Status: She is alert. Mental status is at baseline.  Psychiatric:        Mood and Affect: Mood normal.    Lab Results  Component Value Date   HGBA1C 5.4 03/09/2022   Vitals:   07/01/23 1407  BP: 105/69  Pulse: (!) 106  SpO2: 99%    Patient is participating in a Managed Medicaid Plan:  Yes   A/P: Intellectual disability history and polypharmacy - Medication Reconciliation - Medication list reviewed and updated.  History of Liver transplant.  Understanding of regimen: good  Understanding of indications: good  Potential of compliance: good with the use of pill box. THREE full weeks of all prescription and OTC medications was filled into pill boxes. Patient will return in 2.5 weeks on Thursday for refill.    Refills of medications need for next fill include:  Cetirzine 10mg    Benztropine 1mg  Cyclosporine 100mg  Doxepin 25mg  Prazosin 2mg  Propranolol 10mg  Risperidone3mg     Written patient instructions and updated medication list provided. Total time in face to  face counseling 38 minutes.     Follow up Pharmacist 3 weeks -  2/20 Patient seen with Lavona Mound, PharmD Candidate and Pearletha Forge, PharmD Candidate.

## 2023-07-01 NOTE — Assessment & Plan Note (Signed)
Intellectual disability history and polypharmacy - Medication Reconciliation - Medication list reviewed and updated.  History of Liver transplant.  Understanding of regimen: good  Understanding of indications: good  Potential of compliance: good with the use of pill box. THREE full weeks of all prescription and OTC medications was filled into pill boxes. Patient will return in 2.5 weeks on Thursday for refill.

## 2023-07-01 NOTE — Progress Notes (Signed)
 ekg

## 2023-07-01 NOTE — Patient Instructions (Signed)
It was nice to see you today      Medication Changes: No changed - Continue all medication the same.    Please pickup refills for the following medications prior to your next visit/refill  Cetirzine 10mg    Benztropine 1mg  Cyclosporine 100mg  Doxepin 25mg  Prazosin 2mg  Propranolol 10mg  Risperidone3mg 

## 2023-07-01 NOTE — Addendum Note (Signed)
Addended by: Harvie Bridge on: 07/01/2023 02:50 PM   Modules accepted: Orders

## 2023-07-02 DIAGNOSIS — I10 Essential (primary) hypertension: Secondary | ICD-10-CM | POA: Diagnosis not present

## 2023-07-02 NOTE — Progress Notes (Signed)
 Reviewed and agree with Dr Macky Lower plan.

## 2023-07-03 DIAGNOSIS — F250 Schizoaffective disorder, bipolar type: Secondary | ICD-10-CM | POA: Diagnosis not present

## 2023-07-03 DIAGNOSIS — I10 Essential (primary) hypertension: Secondary | ICD-10-CM | POA: Diagnosis not present

## 2023-07-04 DIAGNOSIS — I10 Essential (primary) hypertension: Secondary | ICD-10-CM | POA: Diagnosis not present

## 2023-07-05 DIAGNOSIS — I10 Essential (primary) hypertension: Secondary | ICD-10-CM | POA: Diagnosis not present

## 2023-07-06 DIAGNOSIS — I10 Essential (primary) hypertension: Secondary | ICD-10-CM | POA: Diagnosis not present

## 2023-07-07 DIAGNOSIS — I10 Essential (primary) hypertension: Secondary | ICD-10-CM | POA: Diagnosis not present

## 2023-07-08 DIAGNOSIS — I10 Essential (primary) hypertension: Secondary | ICD-10-CM | POA: Diagnosis not present

## 2023-07-09 DIAGNOSIS — I10 Essential (primary) hypertension: Secondary | ICD-10-CM | POA: Diagnosis not present

## 2023-07-10 DIAGNOSIS — I10 Essential (primary) hypertension: Secondary | ICD-10-CM | POA: Diagnosis not present

## 2023-07-11 DIAGNOSIS — I10 Essential (primary) hypertension: Secondary | ICD-10-CM | POA: Diagnosis not present

## 2023-07-12 DIAGNOSIS — I10 Essential (primary) hypertension: Secondary | ICD-10-CM | POA: Diagnosis not present

## 2023-07-13 DIAGNOSIS — I10 Essential (primary) hypertension: Secondary | ICD-10-CM | POA: Diagnosis not present

## 2023-07-14 DIAGNOSIS — I10 Essential (primary) hypertension: Secondary | ICD-10-CM | POA: Diagnosis not present

## 2023-07-15 ENCOUNTER — Other Ambulatory Visit (HOSPITAL_COMMUNITY): Payer: Medicaid Other

## 2023-07-16 ENCOUNTER — Ambulatory Visit (HOSPITAL_COMMUNITY)
Admission: RE | Admit: 2023-07-16 | Discharge: 2023-07-16 | Disposition: A | Discharge status: Home / Self Care | Payer: Medicaid Other | Source: Ambulatory Visit | Attending: Emergency Medicine | Admitting: Emergency Medicine

## 2023-07-16 ENCOUNTER — Other Ambulatory Visit: Payer: Self-pay

## 2023-07-16 ENCOUNTER — Other Ambulatory Visit: Payer: Medicaid Other

## 2023-07-16 DIAGNOSIS — Z01818 Encounter for other preprocedural examination: Secondary | ICD-10-CM

## 2023-07-16 DIAGNOSIS — R531 Weakness: Secondary | ICD-10-CM | POA: Insufficient documentation

## 2023-07-16 DIAGNOSIS — N950 Postmenopausal bleeding: Secondary | ICD-10-CM | POA: Diagnosis not present

## 2023-07-16 NOTE — Progress Notes (Unsigned)
Cbc and cmp ordered since patient is here for pre op labs. I don't see any recent notes.

## 2023-07-17 DIAGNOSIS — I10 Essential (primary) hypertension: Secondary | ICD-10-CM | POA: Diagnosis not present

## 2023-07-17 LAB — COMPREHENSIVE METABOLIC PANEL
ALT: 42 [IU]/L — ABNORMAL HIGH (ref 0–32)
AST: 63 [IU]/L — ABNORMAL HIGH (ref 0–40)
Albumin: 4 g/dL (ref 3.9–4.9)
Alkaline Phosphatase: 58 [IU]/L (ref 44–121)
BUN/Creatinine Ratio: 21 (ref 9–23)
BUN: 32 mg/dL — ABNORMAL HIGH (ref 6–24)
Bilirubin Total: 0.4 mg/dL (ref 0.0–1.2)
CO2: 22 mmol/L (ref 20–29)
Calcium: 8.3 mg/dL — ABNORMAL LOW (ref 8.7–10.2)
Chloride: 102 mmol/L (ref 96–106)
Creatinine, Ser: 1.54 mg/dL — ABNORMAL HIGH (ref 0.57–1.00)
Globulin, Total: 3.4 g/dL (ref 1.5–4.5)
Glucose: 145 mg/dL — ABNORMAL HIGH (ref 70–99)
Potassium: 4.5 mmol/L (ref 3.5–5.2)
Sodium: 140 mmol/L (ref 134–144)
Total Protein: 7.4 g/dL (ref 6.0–8.5)
eGFR: 41 mL/min/{1.73_m2} — ABNORMAL LOW (ref >59)

## 2023-07-17 LAB — CBC
Hematocrit: 29.9 % — ABNORMAL LOW (ref 34.0–46.6)
Hemoglobin: 10 g/dL — ABNORMAL LOW (ref 11.1–15.9)
MCH: 28.6 pg (ref 26.6–33.0)
MCHC: 33.4 g/dL (ref 31.5–35.7)
MCV: 85 fL (ref 79–97)
Platelets: 121 10*3/uL — ABNORMAL LOW (ref 150–450)
RBC: 3.5 x10E6/uL — ABNORMAL LOW (ref 3.77–5.28)
RDW: 13.1 % (ref 11.7–15.4)
WBC: 4 10*3/uL (ref 3.4–10.8)

## 2023-07-18 ENCOUNTER — Telehealth: Payer: Self-pay | Admitting: Pharmacist

## 2023-07-18 ENCOUNTER — Ambulatory Visit: Payer: Medicaid Other | Admitting: Pharmacist

## 2023-07-18 DIAGNOSIS — I10 Essential (primary) hypertension: Secondary | ICD-10-CM | POA: Diagnosis not present

## 2023-07-18 NOTE — Telephone Encounter (Signed)
Attempted to contact patient for notification of office closure to in-person visits this afternoon.   Multiple attempts made to contact and reschedule.    I believe patient has medications in pill boxes adequate until 2/24 If patient returns call - PLEASE overbook Monday 2/24 visit time.   If patient cannot come in the AM, please identify a time in the PM and create a note in the 11:00 AM visit slot with time of arrival.    Total time with patient call and documentation of interaction: 14 minutes.  Follow-up phone call planned: 2/21 I will try to contact her again.

## 2023-07-19 ENCOUNTER — Encounter (HOSPITAL_COMMUNITY): Payer: Self-pay | Admitting: Obstetrics and Gynecology

## 2023-07-19 DIAGNOSIS — I10 Essential (primary) hypertension: Secondary | ICD-10-CM | POA: Diagnosis not present

## 2023-07-19 NOTE — Progress Notes (Addendum)
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;  CKD 3;  GERD;  OSA study many yrs ago,  no cpap Medical clearance by Dr [name] dated in epic 03-26-2023 whom referred to office visit w/ Dr Lucie Leather dated 04-02-2023   PCP:  Dr [name] (lov 06-04-2023 w/ Dr Cliffton Asters) Cardiologist : n/a Hepatologist:  Dr Maryan Char [facility] 04-16-2023) Allergy/ Asthma center:  Dr Lucie Leather (lov 04-02-2023) Neurologist:  Dr Terrace Arabia (lov 06-07-2022) Nephrologist:  Myrtis Hopping NP Theron Arista 10-08-2022, care everywhere, [name])  PPM/ ICD: n/a Device Orders: Rep Notified:  Chest x-ray :  08-23-2020 EKG : 07-16-2023 epic Echo : 03-20-2013  (care everywhere) Stress test:  [facility] 12-18-2016 epic Cardiac Cath :  no  Activity level:  Sleep Study/ CPAP : yes (study unavailable) Fasting Blood Sugar :      / Checks Blood Sugar -- times a day:  n/a  Blood Thinner/ Instructions /Last Dose: no ASA / Instructions/ Last Dose : no

## 2023-07-19 NOTE — Telephone Encounter (Signed)
Attempted to contact patient for follow-up of missed appointment.   Left HIPAA compliant voice mail requesting call back to direct phone: [phone] or main number to schedule on PM of 07/22/2023  Total time with patient call and documentation of interaction: 6 minutes.

## 2023-07-20 DIAGNOSIS — I10 Essential (primary) hypertension: Secondary | ICD-10-CM | POA: Diagnosis not present

## 2023-07-21 DIAGNOSIS — I10 Essential (primary) hypertension: Secondary | ICD-10-CM | POA: Diagnosis not present

## 2023-07-22 ENCOUNTER — Emergency Department (HOSPITAL_COMMUNITY): Payer: Medicaid Other

## 2023-07-22 ENCOUNTER — Observation Stay (HOSPITAL_COMMUNITY): Payer: Medicaid Other

## 2023-07-22 ENCOUNTER — Inpatient Hospital Stay (HOSPITAL_COMMUNITY)
Admission: EM | Admit: 2023-07-22 | Discharge: 2023-07-26 | DRG: 193 | Disposition: A | Discharge status: Home Health | Payer: Medicaid Other | Attending: Family Medicine | Admitting: Family Medicine

## 2023-07-22 ENCOUNTER — Telehealth: Payer: Self-pay

## 2023-07-22 DIAGNOSIS — J4541 Moderate persistent asthma with (acute) exacerbation: Secondary | ICD-10-CM

## 2023-07-22 DIAGNOSIS — K219 Gastro-esophageal reflux disease without esophagitis: Secondary | ICD-10-CM | POA: Diagnosis present

## 2023-07-22 DIAGNOSIS — I10 Essential (primary) hypertension: Secondary | ICD-10-CM | POA: Diagnosis not present

## 2023-07-22 DIAGNOSIS — D649 Anemia, unspecified: Secondary | ICD-10-CM | POA: Diagnosis present

## 2023-07-22 DIAGNOSIS — R0989 Other specified symptoms and signs involving the circulatory and respiratory systems: Secondary | ICD-10-CM | POA: Diagnosis not present

## 2023-07-22 DIAGNOSIS — K529 Noninfective gastroenteritis and colitis, unspecified: Secondary | ICD-10-CM | POA: Diagnosis present

## 2023-07-22 DIAGNOSIS — R319 Hematuria, unspecified: Secondary | ICD-10-CM

## 2023-07-22 DIAGNOSIS — J154 Pneumonia due to other streptococci: Secondary | ICD-10-CM

## 2023-07-22 DIAGNOSIS — R197 Diarrhea, unspecified: Secondary | ICD-10-CM | POA: Diagnosis present

## 2023-07-22 DIAGNOSIS — Z79899 Other long term (current) drug therapy: Secondary | ICD-10-CM

## 2023-07-22 DIAGNOSIS — Z944 Liver transplant status: Secondary | ICD-10-CM

## 2023-07-22 DIAGNOSIS — F200 Paranoid schizophrenia: Secondary | ICD-10-CM | POA: Diagnosis present

## 2023-07-22 DIAGNOSIS — Z7951 Long term (current) use of inhaled steroids: Secondary | ICD-10-CM

## 2023-07-22 DIAGNOSIS — J45909 Unspecified asthma, uncomplicated: Secondary | ICD-10-CM | POA: Diagnosis present

## 2023-07-22 DIAGNOSIS — R0902 Hypoxemia: Secondary | ICD-10-CM | POA: Diagnosis not present

## 2023-07-22 DIAGNOSIS — G40909 Epilepsy, unspecified, not intractable, without status epilepticus: Secondary | ICD-10-CM | POA: Diagnosis present

## 2023-07-22 DIAGNOSIS — J189 Pneumonia, unspecified organism: Secondary | ICD-10-CM | POA: Diagnosis not present

## 2023-07-22 DIAGNOSIS — J9601 Acute respiratory failure with hypoxia: Secondary | ICD-10-CM | POA: Diagnosis not present

## 2023-07-22 DIAGNOSIS — Z6841 Body Mass Index (BMI) 40.0 and over, adult: Secondary | ICD-10-CM

## 2023-07-22 DIAGNOSIS — R1310 Dysphagia, unspecified: Secondary | ICD-10-CM

## 2023-07-22 DIAGNOSIS — E875 Hyperkalemia: Secondary | ICD-10-CM | POA: Diagnosis present

## 2023-07-22 DIAGNOSIS — N939 Abnormal uterine and vaginal bleeding, unspecified: Secondary | ICD-10-CM | POA: Diagnosis present

## 2023-07-22 DIAGNOSIS — J690 Pneumonitis due to inhalation of food and vomit: Secondary | ICD-10-CM | POA: Diagnosis present

## 2023-07-22 DIAGNOSIS — N950 Postmenopausal bleeding: Secondary | ICD-10-CM

## 2023-07-22 DIAGNOSIS — F819 Developmental disorder of scholastic skills, unspecified: Secondary | ICD-10-CM | POA: Diagnosis present

## 2023-07-22 DIAGNOSIS — Z888 Allergy status to other drugs, medicaments and biological substances status: Secondary | ICD-10-CM

## 2023-07-22 DIAGNOSIS — I700 Atherosclerosis of aorta: Secondary | ICD-10-CM | POA: Diagnosis not present

## 2023-07-22 DIAGNOSIS — D84821 Immunodeficiency due to drugs: Secondary | ICD-10-CM | POA: Diagnosis present

## 2023-07-22 DIAGNOSIS — Z880 Allergy status to penicillin: Secondary | ICD-10-CM

## 2023-07-22 DIAGNOSIS — W19XXXA Unspecified fall, initial encounter: Secondary | ICD-10-CM

## 2023-07-22 DIAGNOSIS — I672 Cerebral atherosclerosis: Secondary | ICD-10-CM | POA: Diagnosis not present

## 2023-07-22 DIAGNOSIS — Z9049 Acquired absence of other specified parts of digestive tract: Secondary | ICD-10-CM

## 2023-07-22 DIAGNOSIS — Z7960 Long term (current) use of unspecified immunomodulators and immunosuppressants: Secondary | ICD-10-CM

## 2023-07-22 DIAGNOSIS — Z886 Allergy status to analgesic agent status: Secondary | ICD-10-CM

## 2023-07-22 DIAGNOSIS — R0602 Shortness of breath: Secondary | ICD-10-CM | POA: Diagnosis not present

## 2023-07-22 DIAGNOSIS — R059 Cough, unspecified: Secondary | ICD-10-CM | POA: Diagnosis not present

## 2023-07-22 DIAGNOSIS — R918 Other nonspecific abnormal finding of lung field: Secondary | ICD-10-CM | POA: Diagnosis not present

## 2023-07-22 DIAGNOSIS — M1711 Unilateral primary osteoarthritis, right knee: Secondary | ICD-10-CM | POA: Diagnosis present

## 2023-07-22 DIAGNOSIS — Z882 Allergy status to sulfonamides status: Secondary | ICD-10-CM

## 2023-07-22 DIAGNOSIS — R4182 Altered mental status, unspecified: Secondary | ICD-10-CM | POA: Diagnosis not present

## 2023-07-22 DIAGNOSIS — R000 Tachycardia, unspecified: Secondary | ICD-10-CM | POA: Diagnosis not present

## 2023-07-22 DIAGNOSIS — Z79890 Hormone replacement therapy (postmenopausal): Secondary | ICD-10-CM

## 2023-07-22 DIAGNOSIS — F79 Unspecified intellectual disabilities: Secondary | ICD-10-CM | POA: Diagnosis present

## 2023-07-22 DIAGNOSIS — E66813 Obesity, class 3: Secondary | ICD-10-CM

## 2023-07-22 DIAGNOSIS — S0990XA Unspecified injury of head, initial encounter: Secondary | ICD-10-CM | POA: Diagnosis not present

## 2023-07-22 DIAGNOSIS — N1831 Chronic kidney disease, stage 3a: Secondary | ICD-10-CM | POA: Diagnosis present

## 2023-07-22 DIAGNOSIS — Z91040 Latex allergy status: Secondary | ICD-10-CM

## 2023-07-22 DIAGNOSIS — E039 Hypothyroidism, unspecified: Secondary | ICD-10-CM | POA: Diagnosis present

## 2023-07-22 DIAGNOSIS — M797 Fibromyalgia: Secondary | ICD-10-CM | POA: Diagnosis present

## 2023-07-22 DIAGNOSIS — E8809 Other disorders of plasma-protein metabolism, not elsewhere classified: Secondary | ICD-10-CM | POA: Diagnosis present

## 2023-07-22 HISTORY — DX: Diarrhea, unspecified: R19.7

## 2023-07-22 HISTORY — DX: Pneumonia due to other streptococci: J15.4

## 2023-07-22 HISTORY — DX: Hyperkalemia: E87.5

## 2023-07-22 HISTORY — DX: Hematuria, unspecified: R31.9

## 2023-07-22 HISTORY — DX: Unspecified fall, initial encounter: W19.XXXA

## 2023-07-22 LAB — CBC WITH DIFFERENTIAL/PLATELET
Abs Immature Granulocytes: 0 10*3/uL (ref 0.00–0.07)
Band Neutrophils: 10 %
Basophils Absolute: 0 10*3/uL (ref 0.0–0.1)
Basophils Relative: 0 %
Eosinophils Absolute: 0 10*3/uL (ref 0.0–0.5)
Eosinophils Relative: 0 %
HCT: 41.5 % (ref 36.0–46.0)
Hemoglobin: 13.1 g/dL (ref 12.0–15.0)
Lymphocytes Relative: 20 %
Lymphs Abs: 0.8 10*3/uL (ref 0.7–4.0)
MCH: 27.6 pg (ref 26.0–34.0)
MCHC: 31.6 g/dL (ref 30.0–36.0)
MCV: 87.4 fL (ref 80.0–100.0)
Monocytes Absolute: 0.1 10*3/uL (ref 0.1–1.0)
Monocytes Relative: 3 %
Neutro Abs: 3.1 10*3/uL (ref 1.7–7.7)
Neutrophils Relative %: 67 %
Platelets: 218 10*3/uL (ref 150–400)
RBC: 4.75 MIL/uL (ref 3.87–5.11)
RDW: 13.7 % (ref 11.5–15.5)
WBC: 4 10*3/uL (ref 4.0–10.5)
nRBC: 1 % — ABNORMAL HIGH (ref 0.0–0.2)

## 2023-07-22 LAB — BASIC METABOLIC PANEL
Anion gap: 15 (ref 5–15)
BUN: 38 mg/dL — ABNORMAL HIGH (ref 6–20)
CO2: 22 mmol/L (ref 22–32)
Calcium: 8.4 mg/dL — ABNORMAL LOW (ref 8.9–10.3)
Chloride: 107 mmol/L (ref 98–111)
Creatinine, Ser: 1.52 mg/dL — ABNORMAL HIGH (ref 0.44–1.00)
GFR, Estimated: 42 mL/min — ABNORMAL LOW (ref >60)
Glucose, Bld: 137 mg/dL — ABNORMAL HIGH (ref 70–99)
Potassium: 5.2 mmol/L — ABNORMAL HIGH (ref 3.5–5.1)
Sodium: 144 mmol/L (ref 135–145)

## 2023-07-22 LAB — RESP PANEL BY RT-PCR (RSV, FLU A&B, COVID)  RVPGX2
Influenza A by PCR: NEGATIVE
Influenza B by PCR: NEGATIVE
Resp Syncytial Virus by PCR: NEGATIVE
SARS Coronavirus 2 by RT PCR: NEGATIVE

## 2023-07-22 LAB — BRAIN NATRIURETIC PEPTIDE: B Natriuretic Peptide: 68.9 pg/mL (ref 0.0–100.0)

## 2023-07-22 LAB — I-STAT CG4 LACTIC ACID, ED
Lactic Acid, Venous: 1.2 mmol/L (ref 0.5–1.9)
Lactic Acid, Venous: 0.6 mmol/L (ref 0.5–1.9)

## 2023-07-22 MED ORDER — ENOXAPARIN SODIUM 40 MG/0.4ML IJ SOSY
40.0000 mg | PREFILLED_SYRINGE | INTRAMUSCULAR | Status: DC
  Administered 2023-07-23 – 2023-07-26 (×4): 40 mg via SUBCUTANEOUS
  Filled 2023-07-22 (×4): qty 0.4

## 2023-07-22 MED ORDER — LEVOFLOXACIN 750 MG PO TABS: 750.0000 mg | ORAL_TABLET | Freq: Every day | ORAL | Status: DC

## 2023-07-22 MED ORDER — BENZTROPINE MESYLATE 1 MG PO TABS
1.0000 mg | ORAL_TABLET | Freq: Two times a day (BID) | ORAL | Status: DC
  Administered 2023-07-22 – 2023-07-26 (×8): 1 mg via ORAL
  Filled 2023-07-22 (×10): qty 1

## 2023-07-22 MED ORDER — LEVOFLOXACIN 750 MG PO TABS
750.0000 mg | ORAL_TABLET | Freq: Every day | ORAL | Status: DC
  Administered 2023-07-23 – 2023-07-25 (×3): 750 mg via ORAL
  Filled 2023-07-22 (×4): qty 1

## 2023-07-22 MED ORDER — LEVOTHYROXINE SODIUM 75 MCG PO TABS
75.0000 ug | ORAL_TABLET | Freq: Every day | ORAL | Status: DC
  Administered 2023-07-23 – 2023-07-26 (×4): 75 ug via ORAL
  Filled 2023-07-22 (×4): qty 1

## 2023-07-22 MED ORDER — POLYETHYLENE GLYCOL 3350 17 G PO PACK
17.0000 g | PACK | Freq: Two times a day (BID) | ORAL | Status: DC
  Administered 2023-07-22 – 2023-07-24 (×2): 17 g via ORAL
  Filled 2023-07-22 (×5): qty 1

## 2023-07-22 MED ORDER — SODIUM CHLORIDE 0.9 % IV SOLN: INTRAVENOUS | Status: DC

## 2023-07-22 MED ORDER — LORATADINE 10 MG PO TABS
10.0000 mg | ORAL_TABLET | Freq: Every day | ORAL | Status: DC
  Administered 2023-07-23 – 2023-07-26 (×4): 10 mg via ORAL
  Filled 2023-07-22 (×4): qty 1

## 2023-07-22 MED ORDER — FLUTICASONE PROPIONATE 50 MCG/ACT NA SUSP
2.0000 | Freq: Every day | NASAL | Status: DC
  Administered 2023-07-23 – 2023-07-26 (×4): 2 via NASAL
  Filled 2023-07-22: qty 16

## 2023-07-22 MED ORDER — PANTOPRAZOLE SODIUM 40 MG PO TBEC
40.0000 mg | DELAYED_RELEASE_TABLET | Freq: Two times a day (BID) | ORAL | Status: DC
  Administered 2023-07-22 – 2023-07-26 (×8): 40 mg via ORAL
  Filled 2023-07-22 (×8): qty 1

## 2023-07-22 MED ORDER — LACTATED RINGERS IV BOLUS
1000.0000 mL | Freq: Once | INTRAVENOUS | Status: AC
  Administered 2023-07-22: 1000 mL via INTRAVENOUS

## 2023-07-22 MED ORDER — SODIUM ZIRCONIUM CYCLOSILICATE 5 G PO PACK
5.0000 g | PACK | Freq: Once | ORAL | Status: AC
  Administered 2023-07-22: 5 g via ORAL
  Filled 2023-07-22: qty 1

## 2023-07-22 MED ORDER — VITAMIN D 25 MCG (1000 UNIT) PO TABS
2000.0000 [IU] | ORAL_TABLET | Freq: Every day | ORAL | Status: DC
  Administered 2023-07-23 – 2023-07-26 (×4): 2000 [IU] via ORAL
  Filled 2023-07-22 (×4): qty 2

## 2023-07-22 MED ORDER — DOXEPIN HCL 25 MG PO CAPS
25.0000 mg | ORAL_CAPSULE | Freq: Every day | ORAL | Status: DC
  Administered 2023-07-22 – 2023-07-25 (×4): 25 mg via ORAL
  Filled 2023-07-22 (×5): qty 1

## 2023-07-22 MED ORDER — SODIUM CHLORIDE 0.9 % IV SOLN: INTRAVENOUS | Status: AC

## 2023-07-22 MED ORDER — RISPERIDONE 0.5 MG PO TABS
1.5000 mg | ORAL_TABLET | Freq: Two times a day (BID) | ORAL | Status: DC
Start: 2023-07-22 — End: 2023-07-22

## 2023-07-22 MED ORDER — ACETAMINOPHEN 325 MG PO TABS
650.0000 mg | ORAL_TABLET | Freq: Once | ORAL | Status: AC
  Administered 2023-07-22: 650 mg via ORAL
  Filled 2023-07-22: qty 2

## 2023-07-22 MED ORDER — RISPERIDONE 3 MG PO TABS
3.0000 mg | ORAL_TABLET | Freq: Every day | ORAL | Status: DC
  Administered 2023-07-22 – 2023-07-25 (×4): 3 mg via ORAL
  Filled 2023-07-22 (×5): qty 1

## 2023-07-22 MED ORDER — ADULT MULTIVITAMIN W/MINERALS CH
1.0000 | ORAL_TABLET | Freq: Every day | ORAL | Status: DC
  Administered 2023-07-23 – 2023-07-26 (×4): 1 via ORAL
  Filled 2023-07-22 (×4): qty 1

## 2023-07-22 MED ORDER — RISPERIDONE 1 MG PO TABS
1.5000 mg | ORAL_TABLET | Freq: Every morning | ORAL | Status: DC
  Administered 2023-07-23 – 2023-07-26 (×4): 1.5 mg via ORAL
  Filled 2023-07-22 (×4): qty 1

## 2023-07-22 MED ORDER — LEVOFLOXACIN IN D5W 750 MG/150ML IV SOLN
750.0000 mg | Freq: Once | INTRAVENOUS | Status: AC
  Administered 2023-07-22: 750 mg via INTRAVENOUS
  Filled 2023-07-22: qty 150

## 2023-07-22 MED ORDER — FAMOTIDINE 20 MG PO TABS
20.0000 mg | ORAL_TABLET | Freq: Two times a day (BID) | ORAL | Status: DC
  Administered 2023-07-22 – 2023-07-26 (×8): 20 mg via ORAL
  Filled 2023-07-22 (×8): qty 1

## 2023-07-22 MED ORDER — IPRATROPIUM-ALBUTEROL 0.5-2.5 (3) MG/3ML IN SOLN
3.0000 mL | Freq: Once | RESPIRATORY_TRACT | Status: AC
  Administered 2023-07-22: 3 mL via RESPIRATORY_TRACT
  Filled 2023-07-22: qty 3

## 2023-07-22 MED ORDER — AMMONIUM LACTATE 12 % EX LOTN
1.0000 | TOPICAL_LOTION | Freq: Two times a day (BID) | CUTANEOUS | Status: DC
  Administered 2023-07-23 – 2023-07-26 (×7): 1 via TOPICAL
  Filled 2023-07-22: qty 225

## 2023-07-22 MED ORDER — FERROUS SULFATE 325 (65 FE) MG PO TABS
325.0000 mg | ORAL_TABLET | Freq: Every day | ORAL | Status: DC
  Administered 2023-07-23 – 2023-07-26 (×4): 325 mg via ORAL
  Filled 2023-07-22 (×5): qty 1

## 2023-07-22 MED ORDER — CYCLOSPORINE MODIFIED (NEORAL) 100 MG PO CAPS
100.0000 mg | ORAL_CAPSULE | Freq: Two times a day (BID) | ORAL | Status: DC
  Administered 2023-07-22 – 2023-07-26 (×8): 100 mg via ORAL
  Filled 2023-07-22 (×9): qty 1

## 2023-07-22 MED ORDER — AZELASTINE HCL 0.1 % NA SOLN
2.0000 | Freq: Two times a day (BID) | NASAL | Status: DC
  Administered 2023-07-23 – 2023-07-26 (×7): 2 via NASAL
  Filled 2023-07-22: qty 30

## 2023-07-22 MED ORDER — METHYLPREDNISOLONE SODIUM SUCC 125 MG IJ SOLR
125.0000 mg | Freq: Once | INTRAMUSCULAR | Status: AC
  Administered 2023-07-22: 125 mg via INTRAVENOUS
  Filled 2023-07-22: qty 2

## 2023-07-22 MED ORDER — PRAZOSIN HCL 2 MG PO CAPS
2.0000 mg | ORAL_CAPSULE | Freq: Every day | ORAL | Status: DC
  Administered 2023-07-22 – 2023-07-25 (×4): 2 mg via ORAL
  Filled 2023-07-22 (×5): qty 1

## 2023-07-22 MED ORDER — MONTELUKAST SODIUM 10 MG PO TABS
10.0000 mg | ORAL_TABLET | Freq: Every day | ORAL | Status: DC
  Administered 2023-07-22 – 2023-07-25 (×4): 10 mg via ORAL
  Filled 2023-07-22 (×4): qty 1

## 2023-07-22 MED ORDER — PROPRANOLOL HCL 10 MG PO TABS
10.0000 mg | ORAL_TABLET | Freq: Two times a day (BID) | ORAL | Status: DC
  Administered 2023-07-23 – 2023-07-26 (×8): 10 mg via ORAL
  Filled 2023-07-22 (×9): qty 1

## 2023-07-22 MED ORDER — MOMETASONE FURO-FORMOTEROL FUM 200-5 MCG/ACT IN AERO
2.0000 | INHALATION_SPRAY | Freq: Two times a day (BID) | RESPIRATORY_TRACT | Status: DC
  Administered 2023-07-23 – 2023-07-26 (×6): 2 via RESPIRATORY_TRACT
  Filled 2023-07-22: qty 8.8

## 2023-07-22 MED ORDER — DIVALPROEX SODIUM ER 500 MG PO TB24
1000.0000 mg | ORAL_TABLET | Freq: Every day | ORAL | Status: DC
  Administered 2023-07-23 – 2023-07-25 (×4): 1000 mg via ORAL
  Filled 2023-07-22 (×5): qty 2

## 2023-07-22 MED ORDER — IPRATROPIUM-ALBUTEROL 0.5-2.5 (3) MG/3ML IN SOLN
3.0000 mL | Freq: Four times a day (QID) | RESPIRATORY_TRACT | Status: DC
  Administered 2023-07-22 – 2023-07-25 (×9): 3 mL via RESPIRATORY_TRACT
  Filled 2023-07-22 (×10): qty 3

## 2023-07-22 MED ORDER — LACTATED RINGERS IV BOLUS
500.0000 mL | Freq: Once | INTRAVENOUS | Status: AC
  Administered 2023-07-22: 500 mL via INTRAVENOUS

## 2023-07-22 NOTE — ED Triage Notes (Signed)
 Pt bib ems from home c/o sob. Pt RA 70% with rhonchi bilaterally. Pt was placed on 3L 99%. Pt had some non productive coughing fits.     BP 128/68 HR 118 RR 20 CBG 112

## 2023-07-22 NOTE — Assessment & Plan Note (Addendum)
 Unsure if at baseline in setting of recent fall. Alert and oriented x 4 with [place] on exam though speech is slowed and at times unintelligible. - CT head as above

## 2023-07-22 NOTE — Assessment & Plan Note (Addendum)
 Patient reports recent dark urine.  Previous diagnosis of CKD 3.  Creatinine 1.52, unchanged from 6 days ago.  Appears the baseline ~ 1.08 about 5 months ago.  No dysuria.  Possibly secondary to post-menopausal bleeding, UTI, cyclosporine therapy as above. Was scheduled for endometrial biopsy tomorrow 2/25.  - UA placed to further evaluate - Continue IV fluids as above, consider impact of cyclosporine on renal function if creatinine persistently elevated - Will need to ensure follow-up for abnormal uterine bleeding outpatient given she will have missed her appointment tomorrow

## 2023-07-22 NOTE — Assessment & Plan Note (Addendum)
 In setting of immunosuppressive therapy (cyclosporin 100 mg twice daily) and asthma. Respiratory panel negative. Satting 100% on 3 L [place]. Patient came into contact with home aid with cough, diarrhea who recent traveled to [place], [place], and [place].  While CAP is most likely at this time, she is at risk for Pseudomonas and MRSA given her immunosuppression.  Blood in mucus which is likely secondary to the pneumonia. The patient received Solu-Medrol dose in the emergency department. - Admit to [facility] Medicine Teaching Service (Dr. [name] Kettle attending) - Continue IV levofloxacin for CAP, anaerobe, and Pseudomonas coverage.  Transition to p.o. on 2/25. - Will hold further steroids for now in the setting of ongoing immunosuppressant therapy (status post liver transplant) though can consider resuming if not improving with inhaled steroid/bronchodilators alone - Continue home Singulair 10 mg nightly - Continue home DuoNeb 3 mL every 6 hours, add back home Dulera as indicated - A.m. BMP/CBC/Hepatic function panel/HIV/magnesium/TSH

## 2023-07-22 NOTE — ED Provider Triage Note (Signed)
 Emergency Medicine Provider Triage Evaluation Note  [name] , a 48 y.o. female  was evaluated in triage.  Pt complains of shortness of breath.  On oxygen.  Per EMS, unclear if patient uses oxygen chronically.  Not a good historian.  Review of Systems  Unable  Physical Exam  BP (!) 148/121 (BP Location: Left Wrist)   Pulse (!) 101   Temp (!) 101.2 F (38.4 C) (Oral)   Resp 18   LMP 12/02/2022   SpO2 95%  Gen:   Moderate distress Resp:  Increased respiratory effort MSK:   Moves extremities without difficulty  Other:    Medical Decision Making  Medically screening exam initiated at 1:57 PM.  Appropriate orders placed.  [name] was informed that the remainder of the evaluation will be completed by another provider, this initial triage assessment does not replace that evaluation, and the importance of remaining in the ED until their evaluation is complete.     Lorre Nick, MD 07/22/23 [phone]

## 2023-07-22 NOTE — H&P (Cosign Needed Addendum)
 Hospital Admission History and Physical Service Pager: [phone]  Patient name: [name] Medical record number: [id number] Date of Birth: 19-Oct-1975 Age: 48 y.o. Gender: female  Primary Care Provider: [name], DO Consultants: None Code Status: FULL Preferred Emergency Contact:   Contact Information     Name Relation Home Work Mobile   [address] Uncle [phone]  [phone]      Other Contacts   None on File    Chief Complaint: SOB, cough  Assessment and Plan: [name] is a 48 y.o. female presenting with SOB and cough in the setting of immunocompromise as well as diarrhea and hematuria. Differential for this patient's presentation of this includes:  Multifocal CAP: Likeliest etiology. CT chest showed dense consolidation in right lower lung with patchy infiltrates throughout remainders of both lungs.  Patient initially endorsed dry cough, but noted she has been coughing up red/brown sputum.  Patient endorsed potential sick contact who had diarrhea/cough and recent travel to [place], [place] as well as [place]. Aspiration pneumonitis: Likely contributing.  Patient reports being unable to eat normally, notes that she often coughs while eating and believes that some of this material may have entered her airway.  Right lower lung consolidation consistent with likeliest focus for aspirated material. Asthma exacerbation: Unlikely primary etiology.  Received Solu-Medrol and DuoNeb x 1 in the emergency department.  Patient continued to demonstrate raspy cough on exam.  Patient reports consistency with multiple asthma control and medication, which have been insufficient to control current cough. Acute gastroenteritis leading to diarrhea: Possible component.  Patient notes a ~ 1 to 2-day history of diarrhea, nonbloody.  Sick contact had diarrhea and recent travel to [place].  However, patient likely has not been receiving adequate p.o. intake secondary to  aspiration concerns, which could explain diarrheal symptoms.  No nausea/vomiting/fever however. UTI leading to hematuria: Possible component.  Patient notes "bloody"/darker urine recently, although denies dysuria. Of note, patient has been having post-menopausal bleeding recently, with a D&C under general anesthesia scheduled for 2/25. Would not explain patient's cough. Assessment & Plan CAP (community acquired pneumonia) In setting of immunosuppressive therapy (cyclosporin 100 mg twice daily) and asthma. Respiratory panel negative. Satting 100% on 3 L [place]. Patient came into contact with home aid with cough, diarrhea who recent traveled to [place], [place], and [place].  While CAP is most likely at this time, she is at risk for Pseudomonas and MRSA given her immunosuppression.  Blood in mucus which is likely secondary to the pneumonia. The patient received Solu-Medrol dose in the emergency department. - Admit to [facility] Medicine Teaching Service (Dr. [name] Kettle attending) - Continue IV levofloxacin for CAP, anaerobe, and Pseudomonas coverage.  Transition to p.o. on 2/25. - Will hold further steroids for now in the setting of ongoing immunosuppressant therapy (status post liver transplant) though can consider resuming if not improving with inhaled steroid/bronchodilators alone - Continue home Singulair 10 mg nightly - Continue home DuoNeb 3 mL every 6 hours, add back home Dulera as indicated - A.m. BMP/CBC/Hepatic function panel/HIV/magnesium/TSH Fall Recent fall at home, noted head strike to occiput. Attributes to shortness of breath/dizziness on rising from chair. Orthostatics here positive which is in line with her reported symptoms. She appears to be only on low doses of propranolol and prazosin, though these could have contributed. She did report normal PO. No chest pain, change in vision, stroke symptomatology, or seizure-like activity following, though this was an unobserved event.  No focal deficits  noted on exam, though patient's pupils are bilaterally and equally constricted to 2 mm. Did not lose consciousness. - CT head without contrast given head trauma and unknown precise intellectual baseline (below) - PT/OT to evaluate -Continue IVF for now and reassess PO in AM Diarrhea Only reported 1 episode today.  Voluminous, nonbloody, and difficult to hold before getting to the bathroom.  In the context of immunosuppression and recent contact with travel, should consider infectious cause.  Reassuringly at this time without fever.  The Levaquin she is on for her pneumonia would cover some bacterial causes of diarrhea. - Continue to monitor, consider stool PCR testing if persistent Hematuria, CKD3 Patient reports recent dark urine.  Previous diagnosis of CKD 3.  Creatinine 1.52, unchanged from 6 days ago.  Appears the baseline ~ 1.08 about 5 months ago.  No dysuria.  Possibly secondary to post-menopausal bleeding, UTI, cyclosporine therapy as above. Was scheduled for endometrial biopsy tomorrow 2/25.  - UA placed to further evaluate - Continue IV fluids as above, consider impact of cyclosporine on renal function if creatinine persistently elevated - Will need to ensure follow-up for abnormal uterine bleeding outpatient given she will have missed her appointment tomorrow Hyperkalemia K: 5.2.  Could be in the setting of cyclosporine. - S/p Lokelma x1 - AM labs Intellectual delay Unsure if at baseline in setting of recent fall. Alert and oriented x 4 with [place] on exam though speech is slowed and at times unintelligible. - CT head as above  Chronic and Stable Conditions: Liver transplant recipient (2015): Continue cyclosporine 100 mg twice daily for now Asthma with allergic rhinitis: Continue home asthma regimen as above.  Schizophrenia, paranoid type: Continue Risperdal 1.5 mg every morning and 3 mg every night. Seizure disorder: Continue Depakote ER 1 g daily.  Hypothyroidism: Continue Synthroid  75 mcg daily.  FEN/GI: heart healthy diet VTE Prophylaxis: Lovenox  Disposition: Med-surg  History of Present Illness:  [name] is a 48 y.o. female presenting with cough and worsening SOB.  About a week and a half ago, she started to get a cough. It has slowly gotten worse over time. Her cough has been accompanied by red/brown mucus. The cough comes on after eating, and she feels it's hard to get the food go down. She feels she may have inhaled some food. She has been taking cough syrup which helped a little. She denies fever over this time. She has had chest pain when breathing. She did have some diarrhea, and she could not make it to the bathroom quick enough. Her urine has also been dark red recently. She denies dysuria. Her uncle called the paramedics today because the cough was worse and had SOB with it.  Today, she also notes that she fell down on the floor and hit her head on the left side. She did not lose consciousness. She denies vision changes before the fall. She has been dizzy off and on during this period and feels that this, and her SOB, contributed to the fall.  Per EMS, SpO2 on room air was 70%.  In the ED, patient was satting well on 3 L ? 2 L of supplemental O2.  Received IV fluids, DuoNebs, Solu-Medrol.  CT chest showed tense are lower lobe consolidation with patchy infiltrates.  Began on IV Levaquin.  Review Of Systems: Per HPI.  Pertinent Past Medical History: Liver transplant recipient (2015) Asthma with allergic rhinitis Schizophrenia, paranoid type CKD 3 Seizure disorder Hypothyroid Right knee osteoarthritis Morbid obesity  Fibromyalgia Remainder reviewed in history tab.   Pertinent Past Surgical History: Laparoscopic cholecystectomy 2012 Colonosopy 2012 EGD 2014 Liver transplant 2015 Appendectomy Remainder reviewed in history tab.   Pertinent Social History: Tobacco use: None Alcohol use: None Other Substance use: None Lives alone. Her uncle  does come by to check on her every so often and recently came yesterday.  Pertinent Family History: Mother: COPD, T2DM, heart disease Cousin: breast cancer Remainder reviewed in history tab.   Important Outpatient Medications: Albuterol 3 mL every 4 hours as needed Benztropine 1 mg twice daily Pulmicort Symbicort Cetirizine 10 mg daily Cyclosporine 100 mg twice daily Depakote ER 1 g daily Doxepin 25 mg nightly PRN Flonase Flovent Synthroid 75 mcg daily Singular Omeprazole 40 mg twice daily MiraLAX Prazosin 2 mg nightly Propranolol 10 mg twice daily Risperdal 1.5 mg daily and 3 mg nightly Remainder reviewed in medication history.   Objective: BP 119/76   Pulse 94   Temp 98.6 F (37 C) (Oral)   Resp (!) 21   LMP 12/02/2022   SpO2 100%  Exam: General: Ill-appearing, on 3 L nasal cannula, significant body habitus, attentive to interview, participatory in conversation Eyes: Strabismus (exotropia) Cardiovascular: RRR, no murmurs rubs or gallops Respiratory: Diffuse inspiratory and expiratory wheezes in anterior lung fields with rhonchi appreciated at bilateral lung bases Gastrointestinal: Notes some abdominal tenderness to light palpation, bowel sounds present MSK: Full ROM, no gross bilateral edema Neuro: Constricted pupils bilaterally, minimally reactive Psych: Appropriate mood and affect.  Some latency in speech.  Labs:  CBC BMET  Recent Labs  Lab 07/22/23 1443  WBC 4.0  HGB 13.1  HCT 41.5  PLT 218   Recent Labs  Lab 07/22/23 1443  NA 144  K 5.2*  CL 107  CO2 22  BUN 38*  CREATININE 1.52*  GLUCOSE 137*  CALCIUM 8.4*     Lactic acid 1.2 ? 0.6 WNL BNP 68.9  EKG: Sinus tachy, QTc 425  Imaging Studies Performed: CT chest without contrast (2/24) IMPRESSION: Dense consolidation in the right lower lung with patchy infiltrates throughout the remainder of both lungs. This is likely multifocal pneumonia. Follow-up after resolution of acute process  is recommended to exclude underlying focal lesion. Mild aortic atherosclerosis.  CXR (2/24) IMPRESSION: 1. Enlarged cardiac silhouette, likely projectional. 2. No acute airspace disease.  Tomie China, MD 07/22/2023, 10:49 PM PGY-1, [facility] FPTS Intern pager: [phone], text pages welcome Secure chat group [facility] Teaching Service   I agree with the assessment and plan as documented above.  Janeal Holmes, MD PGY-2, [facility]

## 2023-07-22 NOTE — Assessment & Plan Note (Addendum)
 Recent fall at home, noted head strike to occiput. Attributes to shortness of breath/dizziness on rising from chair. Orthostatics here positive which is in line with her reported symptoms. She appears to be only on low doses of propranolol and prazosin, though these could have contributed. She did report normal PO. No chest pain, change in vision, stroke symptomatology, or seizure-like activity following, though this was an unobserved event.  No focal deficits noted on exam, though patient's pupils are bilaterally and equally constricted to 2 mm. Did not lose consciousness. - CT head without contrast given head trauma and unknown precise intellectual baseline (below) - PT/OT to evaluate -Continue IVF for now and reassess PO in AM

## 2023-07-22 NOTE — Assessment & Plan Note (Addendum)
 K: 5.2.  Could be in the setting of cyclosporine. - S/p Lokelma x1 - AM labs

## 2023-07-22 NOTE — Telephone Encounter (Signed)
 Reached out to patient to advise that Luther Parody w/ [facility] Pre-op dept. has attempted to reach her to provide pre-op details for her surgery on 07/23/23. Left a voicemail asking the patient to call me asap at [phone]

## 2023-07-22 NOTE — Progress Notes (Signed)
 Multiple attempts made to reach pt since last week, spoke with Dejuana at Dr Alg[facility] office who has also tried to contact pt. Per progress note in patient chart she is currently in the ED with shortness of breath and O2 sat in the 70%'s. Will reach out to let Dr [facility] office to let them know.

## 2023-07-22 NOTE — Hospital Course (Signed)
 [name] is a 48 y.o. female who was admitted to the [facility] Medicine Teaching Service at [facility] for multifocal pneumonia.  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  Hospital course is outlined below by problem.   Multifocal pneumonia Presented with cough and worsening shortness of breath.  She had an oxygen requirement of 3 L as well as wheezing and rhonchi on exam.  She was afebrile.  Labs overall reassuring with normal white blood cell count.  CT chest with confirmatory findings.  In the setting of immunosuppression, patient was started on Levaquin for total of 7 day dose (2/24 - 3/2). Noted improvement over stay, slowly weaned to RA. Patient able to ambulate without oxygen.  Given her wheezing, she was also given scheduled DuoNebs and continued on home asthma regimen.  Strep urine antigen was positive.  Order SLP evaluation given patient has history of GERD with LPR and endorsed choking on certain foods.  Initial evaluation showed concern for delayed cough following all trials of thin liquids and solids.  SLP recommended MBS which showed no concern for increased aspiration risk.  Fall, intellectual delay Has a history of multiple documented falls.  Causes in the past felt to be due to tripping, orthostasis, seizures.  Orthostatic vital signs positive, likely in setting of acute illness. Given patient hit her head with the most acute fall, and her precise intellectual baseline was not known, CT head was obtained which demonstrated no acute findings. PT recommended home health PT, OT did not require follow-up. Likely remained at intellectual baseline throughout stay.  Hyperkalemia Potassium elevated on admission to 5.2.  Patient received Lokelma, follow-up BMP showed normalization.   Anemia Endorsed red urine on admission.  Of note, AUB, to be  contributing, as she was scheduled for endometrial biopsy under sedation on 2/25.  This was not performed given her inpatient admission.  UA negative for Hg - blood likely from AUB. Hg was 8.7 on admission and remained stable around 8-9, decreased from baseline of ~10-11.    Other conditions that were chronic and stable: Hypothyroidism, liver transplant recipient, schizophrenia, seizure disorder   Issues for follow up: Consider further work up of anemia if D&C does not warrant answers  Patient had 1 episode of hyperkalemia, resolved during admission. Consider f/u BMP

## 2023-07-22 NOTE — ED Provider Notes (Signed)
 [place] [facility]ERGENC[facility] DE[facility]MENT AT [facility] Provider Note   CSN: [id number] Arrival date & time: 07/22/23  1341     History  Chief Complaint  Patient presents with   Shortness of Breath    Marletta Elif Yonts is a 48 y.o. female.   Shortness of Breath Associated symptoms: cough   Patient presents for shortness of breath.  Medical history includes schizophrenia, PVC, anemia, GERD, asthma, seizures, arthritis, CKD, fibromyalgia, intellectual disability.  She underwent liver transplant in 2015.  She describes persistent cough for the past 3 days.  She denies home use of supplemental oxygen.  Per EMS, her SpO2 on room air was 70%.     Home Medications Prior to Admission medications   Medication Sig Start Date End Date Taking? Authorizing Provider  albuterol (PROVENTIL) (2.5 MG/3ML) 0.083% nebulizer solution Take 3 mLs (2.5 mg total) by nebulization every 4 (four) hours as needed for wheezing or shortness of breath. 1 vial via nebulizer every 4-6 hours as needed. 04/02/23  Yes Kozlow, Alvira Philips, MD  ammonium lactate (AMLACTIN) 12 % cream Apply 1 Application topically 2 (two) times daily.   Yes [provider]  azelastine (ASTELIN) 0.1 % nasal spray Place 2 sprays into both nostrils 2 (two) times daily. 04/02/23  Yes Kozlow, Alvira Philips, MD  benztropine (COGENTIN) 1 MG tablet Take 1 tablet (1 mg total) by mouth 2 (two) times daily. 01/31/23  Yes McDiarmid, Leighton Roach, MD  budesonide (PULMICORT) 0.5 MG/2ML nebulizer solution Take 2 mLs (0.5 mg total) by nebulization every 4 (four) hours as needed. Patient taking differently: Take 0.5 mg by nebulization every 4 (four) hours as needed (Asthma). 08/14/22  Yes Kozlow, Alvira Philips, MD  budesonide-formoterol [facility]) 160-4.5 MCG/ACT inhaler Inhale 2 puffs into the lungs 2 (two) times daily. Use with spacer and mask. 04/02/23  Yes Kozlow, Alvira Philips, MD  carboxymethylcellulose (REFRESH PLUS) 0.5 % SOLN Place 1 drop into both eyes 4 (four) times daily  as needed (for dry eyes).   Yes [provider]  cetirizine (ZYRTEC) 10 MG tablet Take 1 tablet (10 mg total) by mouth daily as needed for allergies (Can use an extra dose during flares). Patient taking differently: Take 10 mg by mouth once a week. 04/02/23  Yes Kozlow, Alvira Philips, MD  cycloSPORINE modified 100 MG CAPS Take 100 mg by mouth 2 (two) times daily.   Yes [provider]  divalproex (DEPAKOTE ER) 500 MG 24 hr tablet TAKE 2 TABLETS(1000 MG) BY MOUTH AT BEDTIME 09/06/22  Yes Levert Feinstein, MD  doxepin (SINEQUAN) 25 MG capsule Take 1 capsule (25 mg total) by mouth at bedtime. 04/04/23  Yes McDiarmid, Leighton Roach, MD  famotidine (PEPCID) 20 MG tablet Take 1 tablet (20 mg total) by mouth 2 (two) times daily. 04/02/23  Yes Kozlow, Alvira Philips, MD  ferrous sulfate 325 (65 FE) MG EC tablet Take 1 tablet by mouth daily. 03/02/20  Yes [provider]  fluticasone (FLONASE) 50 MCG/ACT nasal spray Place 1 spray into both nostrils in the morning and at bedtime. 04/02/23  Yes Kozlow, Alvira Philips, MD  fluticasone (FLOVENT HFA) 110 MCG/ACT inhaler Inhale 2 puffs into the lungs 2 (two) times daily. Only during asthma flare ups 04/02/23  Yes Kozlow, Alvira Philips, MD  levothyroxine (SYNTHROID) 75 MCG tablet TAKE 1 TABLET(75 MCG) BY MOUTH EVERY MORNING 30 MINUTES BEFORE FOOD 04/05/23  Yes [name], DO  mometasone (ELOCON) 0.1 % cream Apply topically 2 (two) times daily as needed.  Can use on skin one to two times daily if needed. 04/02/23  Yes Kozlow, Alvira Philips, MD  montelukast (SINGULAIR) 10 MG tablet Take 1 tablet (10 mg total) by mouth at bedtime. 04/02/23  Yes Kozlow, Alvira Philips, MD  Multiple Vitamin (MULTIVITAMIN) tablet Take 2 tablets by mouth daily. 9 am. 10/12/13  Yes Charm Rings, NP  omeprazole (PRILOSEC) 40 MG capsule TAKE 1 CAPSULE(40 MG) BY MOUTH TWICE DAILY 05/13/23  Yes Kozlow, Alvira Philips, MD  polyethylene glycol (MIRALAX / GLYCOLAX) packet Take 17 g by mouth daily as needed (for constipation). In water or juice    Yes [provider]  prazosin (MINIPRESS) 2 MG capsule Take 1 capsule (2 mg total) by mouth at bedtime. 01/31/23  Yes McDiarmid, Leighton Roach, MD  propranolol (INDERAL) 10 MG tablet Take 1 tablet (10 mg total) by mouth 2 (two) times daily. 03/07/23  Yes McDiarmid, Leighton Roach, MD  RA VITAMIN D-3 50 MCG (2000 UT) CAPS Take 1 capsule (2,000 Units total) by mouth daily. 02/19/23  Yes McDiarmid, Leighton Roach, MD  risperiDONE (RISPERDAL M-TABS) 3 MG disintegrating tablet Take 3 mg by mouth daily as needed. 07/03/23  Yes [provider]  risperiDONE (RISPERDAL) 3 MG tablet Take 0.5-1 tablets (1.5-3 mg total) by mouth 2 (two) times daily. 1/2 of the 3mg  tablet in AM and one 3mg  tablet in the PM 04/04/23  Yes McDiarmid, Leighton Roach, MD  Tiotropium Bromide Monohydrate (SPIRIVA RESPIMAT) 1.25 MCG/ACT AERS Inhale 2 puffs into the lungs in the morning. 04/02/23  Yes Kozlow, Alvira Philips, MD  VENTOLIN HFA 108 (90 Base) MCG/ACT inhaler Inhale 2 puffs into the lungs every 4 (four) hours as needed for wheezing or shortness of breath. Patient taking differently: Inhale 2 puffs into the lungs every 4 (four) hours. 04/02/23  Yes Kozlow, Alvira Philips, MD  Wheat Dextrin (BENEFIBER DRINK MIX PO) Take 1 Scoop by mouth 3 (three) times a week.   Yes [provider]  glucose blood (ACCU-CHEK GUIDE) test strip USE TO CHECK BLOOD SUGAR EVERY DAY Patient not taking: Reported on 07/01/2023 08/09/20   Allayne Stack, DO  lidocaine (HM LIDOCAINE PATCH) 4 % Place 1 patch onto the skin daily. Patient not taking: Reported on 07/01/2023 09/17/22   [name], DO  Nebulizers MISC 1 kit by Does not apply route as directed. 08/14/22   Kozlow, Alvira Philips, MD  predniSONE (DELTASONE) 10 MG tablet Take 1 tablet (10 mg total) by mouth daily with breakfast. Take this medication for 5 days prior to the procedure. Patient not taking: Reported on 07/01/2023 06/12/23   [name], DO  Respiratory Therapy Supplies (NEBULIZER/TUBING/MOUTHPIECE) KIT 2 each by Does not  apply route as directed. 12/22/18   Kozlow, Alvira Philips, MD  Spacer/Aero-Hold Chamber Mask MISC Use as directed with inhaler. 08/14/22   Kozlow, Alvira Philips, MD      Allergies    Tramadol hcl, Aspirin, Ibuprofen, Penicillins, Pseudoephedrine, Sulfa antibiotics, Acetaminophen, Guaifenesin, Latex, and Mucinex fast-max    Review of Systems   Review of Systems  Respiratory:  Positive for cough and shortness of breath.   All other systems reviewed and are negative.   Physical Exam Updated Vital Signs BP 119/76   Pulse 94   Temp 98.6 F (37 C) (Oral)   Resp (!) 21   LMP 12/02/2022   SpO2 100%  Physical Exam Vitals and nursing note reviewed.  Constitutional:      General: She is not in acute distress.  Appearance: She is well-developed. She is not ill-appearing, toxic-appearing or diaphoretic.  HENT:     Head: Normocephalic and atraumatic.     Mouth/Throat:     Mouth: Mucous membranes are moist.  Eyes:     Conjunctiva/sclera: Conjunctivae normal.  Cardiovascular:     Rate and Rhythm: Regular rhythm. Tachycardia present.     Heart sounds: No murmur heard. Pulmonary:     Effort: Pulmonary effort is normal. Tachypnea present. No accessory muscle usage or respiratory distress.     Breath sounds: Wheezing and rhonchi present.  Abdominal:     Palpations: Abdomen is soft.     Tenderness: There is no abdominal tenderness.  Musculoskeletal:        General: No swelling. Normal range of motion.     Cervical back: Normal range of motion and neck supple.  Skin:    General: Skin is warm and dry.     Coloration: Skin is not cyanotic or pale.  Neurological:     General: No focal deficit present.     Mental Status: She is alert.  Psychiatric:        Mood and Affect: Mood normal.        Behavior: Behavior normal.     ED Results / Procedures / Treatments   Labs (all labs ordered are listed, but only abnormal results are displayed) Labs Reviewed  BASIC METABOLIC PANEL - Abnormal; Notable for  the following components:      Result Value   Potassium 5.2 (*)    Glucose, Bld 137 (*)    BUN 38 (*)    Creatinine, Ser 1.52 (*)    Calcium 8.4 (*)    GFR, Estimated 42 (*)    All other components within normal limits  CBC WITH DIFFERENTIAL/PLATELET - Abnormal; Notable for the following components:   nRBC 1.0 (*)    All other components within normal limits  RESP PANEL BY RT-PCR (RSV, FLU A&B, COVID)  RVPGX2  CULTURE, BLOOD (ROUTINE X 2)  CULTURE, BLOOD (ROUTINE X 2)  BRAIN NATRIURETIC PEPTIDE  URINALYSIS, W/ REFLEX TO CULTURE (INFECTION SUSPECTED)  HEPATIC FUNCTION PANEL  MAGNESIUM  TSH  I-STAT CG4 LACTIC ACID, ED  I-STAT CG4 LACTIC ACID, ED    EKG None  Radiology CT Chest Wo Contrast Result Date: 07/22/2023 CLINICAL DATA:  Respiratory illness with nondiagnostic x-ray. EXAM: CT CHEST WITHOUT CONTRAST TECHNIQUE: Multidetector CT imaging of the chest was performed following the standard protocol without IV contrast. RADIATION DOSE REDUCTION: This exam was performed according to the departmental dose-optimization program which includes automated exposure control, adjustment of the mA and/or kV according to patient size and/or use of iterative reconstruction technique. COMPARISON:  Chest radiograph 07/22/2023.  Chest CT 06/20/2005 FINDINGS: Cardiovascular: Normal heart size. No pericardial effusions. Normal caliber thoracic aorta. Mild aortic calcification. Mediastinum/Nodes: Esophagus is decompressed. No significant lymphadenopathy. Thyroid gland is unremarkable. Lungs/Pleura: Dense consolidation in the right lower lung with patchy infiltrates throughout the remainder of the lungs bilaterally. Changes are likely to represent multifocal pneumonia. Follow-up after resolution of the acute process is recommended to exclude any underlying focal or obstructing lesions. No pleural effusion or pneumothorax. Upper Abdomen: No acute abnormality. Musculoskeletal: No chest wall mass or suspicious  bone lesions identified. IMPRESSION: Dense consolidation in the right lower lung with patchy infiltrates throughout the remainder of both lungs. This is likely multifocal pneumonia. Follow-up after resolution of acute process is recommended to exclude underlying focal lesion. Mild aortic atherosclerosis. Electronically Signed   By:  Burman Nieves M.D.   On: 07/22/2023 21:15   DG Chest 2 View Result Date: 07/22/2023 CLINICAL DATA:  Hypoxia, cough, rhonchi EXAM: CHEST - 2 VIEW COMPARISON:  05/04/2022 FINDINGS: Single frontal view of the chest demonstrates an enlarged cardiac silhouette, likely accentuated by portable technique and AP positioning. No airspace disease, effusion, or pneumothorax. No acute bony abnormalities. IMPRESSION: 1. Enlarged cardiac silhouette, likely projectional. 2. No acute airspace disease. Electronically Signed   By: Sharlet Salina M.D.   On: 07/22/2023 16:24    Procedures Procedures    Medications Ordered in ED Medications  0.9 %  sodium chloride infusion ( Intravenous New Bag/Given 07/22/23 1829)  levofloxacin (LEVAQUIN) IVPB 750 mg (750 mg Intravenous New Bag/Given 07/22/23 2029)  lactated ringers bolus 1,000 mL (0 mLs Intravenous Stopped 07/22/23 2025)  acetaminophen (TYLENOL) tablet 650 mg (650 mg Oral Given 07/22/23 1818)  ipratropium-albuterol (DUONEB) 0.5-2.5 (3) MG/3ML nebulizer solution 3 mL (3 mLs Nebulization Given 07/22/23 1818)  methylPREDNISolone sodium succinate (SOLU-MEDROL) 125 mg/2 mL injection 125 mg (125 mg Intravenous Given 07/22/23 1819)  lactated ringers bolus 500 mL (500 mLs Intravenous New Bag/Given 07/22/23 2028)    ED Course/ Medical Decision Making/ A&P                                 Medical Decision Making Amount and/or Complexity of Data Reviewed Labs: ordered. Radiology: ordered.  Risk OTC drugs. Prescription drug management. Decision regarding hospitalization.   This patient presents to the ED for concern of cough and shortness  of breath, this involves an extensive number of treatment options, and is a complaint that carries with it a high risk of complications and morbidity.  The differential diagnosis includes asthma exacerbation, pneumonia, CHF, pleural effusion, URI   Co morbidities that complicate the patient evaluation  schizophrenia, PVC, anemia, GERD, asthma, seizures, arthritis, CKD, fibromyalgia, intellectual disability   Additional history obtained:  Additional history obtained from N/A External records from outside source obtained and reviewed including EMR   Lab Tests:  I Ordered, and personally interpreted labs.  The pertinent results include: Normal hemoglobin, no leukocytosis, creatinine is elevated when compared to baseline, but consistent with lab work from a week ago.  There is mild hyperkalemia.   Imaging Studies ordered:  I ordered imaging studies including chest x-ray, CT chest I independently visualized and interpreted imaging which showed dense right lower lung consolidation with other areas of patchy infiltrates consistent with multifocal pneumonia. I agree with the radiologist interpretation   Cardiac Monitoring: / EKG:  The patient was maintained on a cardiac monitor.  I personally viewed and interpreted the cardiac monitored which showed an underlying rhythm of: Sinus rhythm   Problem List / ED Course / Critical interventions / Medication management  Patient presenting for 3 days of cough and shortness of breath.  Found to be hypoxic on room air with EMS.  She denies home use of oxygen.  Vital signs on arrival in the ED notable for fever and tachycardia.  On exam, patient has ongoing coughing.  She remains on 2 L of supplemental oxygen.  She has wheezing and rhonchi present on lung auscultation.  Solu-Medrol and DuoNeb were ordered.  Tylenol was ordered for antipyresis.  Workup was initiated.  Although not identified on x-ray, CT scan does show dense right lower lung  consolidation with some other areas of patchy infiltrates.  Antibiotics were ordered.  Patient was admitted  for further management. I ordered medication including Tylenol for antipyresis; IV fluids for hydration; Solu-Medrol and DuoNeb for reactive airway disease exacerbation, Levaquin for pneumonia Reevaluation of the patient after these medicines showed that the patient improved I have reviewed the patients home medicines and have made adjustments as needed   Social Determinants of Health:  Intellectual disability at baseline  CRITICAL CARE Performed by: Gloris Manchester   Total critical care time: 33 minutes  Critical care time was exclusive of separately billable procedures and treating other patients.  Critical care was necessary to treat or prevent imminent or life-threatening deterioration.  Critical care was time spent personally by me on the following activities: development of treatment plan with patient and/or surrogate as well as nursing, discussions with consultants, evaluation of patient's response to treatment, examination of patient, obtaining history from patient or surrogate, ordering and performing treatments and interventions, ordering and review of laboratory studies, ordering and review of radiographic studies, pulse oximetry and re-evaluation of patient's condition.         Final Clinical Impression(s) / ED Diagnoses Final diagnoses:  Acute respiratory failure with hypoxia (HCC)  Multifocal pneumonia  Moderate persistent asthma with exacerbation    Rx / DC Orders ED Discharge Orders     None         Gloris Manchester, MD 07/22/23 2144

## 2023-07-23 ENCOUNTER — Encounter (HOSPITAL_COMMUNITY): Payer: Self-pay

## 2023-07-23 ENCOUNTER — Ambulatory Visit (HOSPITAL_COMMUNITY)
Admission: RE | Admit: 2023-07-23 | Payer: Medicaid Other | Source: Home / Self Care | Admitting: Obstetrics and Gynecology

## 2023-07-23 ENCOUNTER — Encounter (HOSPITAL_COMMUNITY)
Admission: EM | Disposition: A | Discharge status: Home Health | Payer: Self-pay | Source: Home / Self Care | Attending: Family Medicine

## 2023-07-23 ENCOUNTER — Other Ambulatory Visit: Payer: Self-pay

## 2023-07-23 DIAGNOSIS — J189 Pneumonia, unspecified organism: Secondary | ICD-10-CM | POA: Diagnosis not present

## 2023-07-23 DIAGNOSIS — N950 Postmenopausal bleeding: Secondary | ICD-10-CM | POA: Diagnosis not present

## 2023-07-23 DIAGNOSIS — Z6841 Body Mass Index (BMI) 40.0 and over, adult: Secondary | ICD-10-CM | POA: Diagnosis not present

## 2023-07-23 DIAGNOSIS — J690 Pneumonitis due to inhalation of food and vomit: Secondary | ICD-10-CM | POA: Diagnosis not present

## 2023-07-23 DIAGNOSIS — R918 Other nonspecific abnormal finding of lung field: Secondary | ICD-10-CM | POA: Diagnosis not present

## 2023-07-23 DIAGNOSIS — E66813 Obesity, class 3: Secondary | ICD-10-CM | POA: Diagnosis not present

## 2023-07-23 DIAGNOSIS — R0602 Shortness of breath: Secondary | ICD-10-CM | POA: Diagnosis not present

## 2023-07-23 DIAGNOSIS — Z7951 Long term (current) use of inhaled steroids: Secondary | ICD-10-CM | POA: Diagnosis not present

## 2023-07-23 DIAGNOSIS — G40909 Epilepsy, unspecified, not intractable, without status epilepticus: Secondary | ICD-10-CM | POA: Diagnosis not present

## 2023-07-23 DIAGNOSIS — K219 Gastro-esophageal reflux disease without esophagitis: Secondary | ICD-10-CM | POA: Diagnosis not present

## 2023-07-23 DIAGNOSIS — J9601 Acute respiratory failure with hypoxia: Secondary | ICD-10-CM

## 2023-07-23 DIAGNOSIS — E8809 Other disorders of plasma-protein metabolism, not elsewhere classified: Secondary | ICD-10-CM

## 2023-07-23 DIAGNOSIS — M1711 Unilateral primary osteoarthritis, right knee: Secondary | ICD-10-CM | POA: Diagnosis not present

## 2023-07-23 DIAGNOSIS — J4541 Moderate persistent asthma with (acute) exacerbation: Secondary | ICD-10-CM

## 2023-07-23 DIAGNOSIS — F79 Unspecified intellectual disabilities: Secondary | ICD-10-CM | POA: Diagnosis not present

## 2023-07-23 DIAGNOSIS — Z79890 Hormone replacement therapy (postmenopausal): Secondary | ICD-10-CM | POA: Diagnosis not present

## 2023-07-23 DIAGNOSIS — R1310 Dysphagia, unspecified: Secondary | ICD-10-CM | POA: Diagnosis not present

## 2023-07-23 DIAGNOSIS — I700 Atherosclerosis of aorta: Secondary | ICD-10-CM | POA: Diagnosis not present

## 2023-07-23 DIAGNOSIS — Z944 Liver transplant status: Secondary | ICD-10-CM | POA: Diagnosis not present

## 2023-07-23 DIAGNOSIS — R0902 Hypoxemia: Secondary | ICD-10-CM | POA: Diagnosis not present

## 2023-07-23 DIAGNOSIS — R0989 Other specified symptoms and signs involving the circulatory and respiratory systems: Secondary | ICD-10-CM | POA: Diagnosis not present

## 2023-07-23 DIAGNOSIS — E875 Hyperkalemia: Secondary | ICD-10-CM | POA: Diagnosis not present

## 2023-07-23 DIAGNOSIS — D84821 Immunodeficiency due to drugs: Secondary | ICD-10-CM | POA: Diagnosis not present

## 2023-07-23 DIAGNOSIS — E039 Hypothyroidism, unspecified: Secondary | ICD-10-CM | POA: Diagnosis not present

## 2023-07-23 DIAGNOSIS — J154 Pneumonia due to other streptococci: Secondary | ICD-10-CM | POA: Diagnosis not present

## 2023-07-23 DIAGNOSIS — Z9049 Acquired absence of other specified parts of digestive tract: Secondary | ICD-10-CM | POA: Diagnosis not present

## 2023-07-23 DIAGNOSIS — N1831 Chronic kidney disease, stage 3a: Secondary | ICD-10-CM | POA: Diagnosis not present

## 2023-07-23 DIAGNOSIS — R059 Cough, unspecified: Secondary | ICD-10-CM | POA: Diagnosis not present

## 2023-07-23 DIAGNOSIS — D649 Anemia, unspecified: Secondary | ICD-10-CM | POA: Diagnosis not present

## 2023-07-23 DIAGNOSIS — M797 Fibromyalgia: Secondary | ICD-10-CM | POA: Diagnosis not present

## 2023-07-23 DIAGNOSIS — F200 Paranoid schizophrenia: Secondary | ICD-10-CM | POA: Diagnosis not present

## 2023-07-23 HISTORY — DX: Iron deficiency anemia, unspecified: D50.9

## 2023-07-23 HISTORY — DX: Sickle-cell trait: D57.3

## 2023-07-23 HISTORY — DX: Chronic kidney disease, stage 3 unspecified: N18.30

## 2023-07-23 HISTORY — DX: Unspecified osteoarthritis, unspecified site: M19.90

## 2023-07-23 HISTORY — DX: Disorder of the skin and subcutaneous tissue, unspecified: L98.9

## 2023-07-23 HISTORY — DX: Fibromyalgia: M79.7

## 2023-07-23 HISTORY — DX: Other constipation: K59.09

## 2023-07-23 HISTORY — DX: Schizoaffective disorder, bipolar type: F25.0

## 2023-07-23 HISTORY — DX: Allergic rhinitis due to pollen: J30.1

## 2023-07-23 HISTORY — DX: Moderate persistent asthma, uncomplicated: J45.40

## 2023-07-23 HISTORY — DX: Obstructive sleep apnea (adult) (pediatric): G47.33

## 2023-07-23 HISTORY — DX: Personal history of peptic ulcer disease: Z87.11

## 2023-07-23 HISTORY — DX: Other long term (current) drug therapy: Z79.899

## 2023-07-23 HISTORY — DX: Acute respiratory failure with hypoxia: J96.01

## 2023-07-23 HISTORY — DX: Moderate persistent asthma with (acute) exacerbation: J45.41

## 2023-07-23 HISTORY — DX: Epilepsy, unspecified, not intractable, without status epilepticus: G40.909

## 2023-07-23 HISTORY — DX: Postmenopausal bleeding: N95.0

## 2023-07-23 HISTORY — DX: Cough, unspecified: R05.9

## 2023-07-23 HISTORY — DX: Other disorders of plasma-protein metabolism, not elsewhere classified: E88.09

## 2023-07-23 HISTORY — DX: Other allergic rhinitis: J30.89

## 2023-07-23 HISTORY — DX: Immunodeficiency due to drugs: D84.821

## 2023-07-23 LAB — URINALYSIS, W/ REFLEX TO CULTURE (INFECTION SUSPECTED)
Bilirubin Urine: NEGATIVE
Glucose, UA: NEGATIVE mg/dL
Hgb urine dipstick: NEGATIVE
Ketones, ur: NEGATIVE mg/dL
Leukocytes,Ua: NEGATIVE
Nitrite: NEGATIVE
Protein, ur: NEGATIVE mg/dL
Specific Gravity, Urine: 1.014 (ref 1.005–1.030)
pH: 5 (ref 5.0–8.0)

## 2023-07-23 LAB — HEPATIC FUNCTION PANEL
ALT: 42 U/L (ref 0–44)
AST: 40 U/L (ref 15–41)
Albumin: 2.2 g/dL — ABNORMAL LOW (ref 3.5–5.0)
Alkaline Phosphatase: 31 U/L — ABNORMAL LOW (ref 38–126)
Bilirubin, Direct: 0.1 mg/dL (ref 0.0–0.2)
Indirect Bilirubin: 0.2 mg/dL — ABNORMAL LOW (ref 0.3–0.9)
Total Bilirubin: 0.3 mg/dL (ref 0.0–1.2)
Total Protein: 7.1 g/dL (ref 6.5–8.1)

## 2023-07-23 LAB — CBC
HCT: 26.4 % — ABNORMAL LOW (ref 36.0–46.0)
HCT: 27.6 % — ABNORMAL LOW (ref 36.0–46.0)
Hemoglobin: 8.6 g/dL — ABNORMAL LOW (ref 12.0–15.0)
Hemoglobin: 8.9 g/dL — ABNORMAL LOW (ref 12.0–15.0)
MCH: 28.8 pg (ref 26.0–34.0)
MCH: 28.1 pg (ref 26.0–34.0)
MCHC: 32.6 g/dL (ref 30.0–36.0)
MCHC: 32.2 g/dL (ref 30.0–36.0)
MCV: 88.3 fL (ref 80.0–100.0)
MCV: 87.1 fL (ref 80.0–100.0)
Platelets: 219 10*3/uL (ref 150–400)
Platelets: 247 10*3/uL (ref 150–400)
RBC: 2.99 MIL/uL — ABNORMAL LOW (ref 3.87–5.11)
RBC: 3.17 MIL/uL — ABNORMAL LOW (ref 3.87–5.11)
RDW: 13.6 % (ref 11.5–15.5)
RDW: 13.3 % (ref 11.5–15.5)
WBC: 4.5 10*3/uL (ref 4.0–10.5)
WBC: 4.7 10*3/uL (ref 4.0–10.5)
nRBC: 0 % (ref 0.0–0.2)
nRBC: 0.4 % — ABNORMAL HIGH (ref 0.0–0.2)

## 2023-07-23 LAB — BASIC METABOLIC PANEL
Anion gap: 5 (ref 5–15)
BUN: 32 mg/dL — ABNORMAL HIGH (ref 6–20)
CO2: 22 mmol/L (ref 22–32)
Calcium: 7.6 mg/dL — ABNORMAL LOW (ref 8.9–10.3)
Chloride: 113 mmol/L — ABNORMAL HIGH (ref 98–111)
Creatinine, Ser: 1.02 mg/dL — ABNORMAL HIGH (ref 0.44–1.00)
GFR, Estimated: >60 mL/min (ref >60)
Glucose, Bld: 206 mg/dL — ABNORMAL HIGH (ref 70–99)
Potassium: 5.4 mmol/L — ABNORMAL HIGH (ref 3.5–5.1)
Sodium: 140 mmol/L (ref 135–145)

## 2023-07-23 LAB — TSH: TSH: 0.626 u[IU]/mL (ref 0.350–4.500)

## 2023-07-23 LAB — STREP PNEUMONIAE URINARY ANTIGEN: Strep Pneumo Urinary Antigen: POSITIVE — AB

## 2023-07-23 LAB — MRSA NEXT GEN BY PCR, NASAL: MRSA by PCR Next Gen: NOT DETECTED

## 2023-07-23 LAB — HIV ANTIBODY (ROUTINE TESTING W REFLEX): HIV Screen 4th Generation wRfx: NONREACTIVE

## 2023-07-23 LAB — MAGNESIUM: Magnesium: 2.5 mg/dL — ABNORMAL HIGH (ref 1.7–2.4)

## 2023-07-23 SURGERY — DILATATION AND CURETTAGE /HYSTEROSCOPY
Anesthesia: Choice

## 2023-07-23 MED ORDER — MUPIROCIN 2 % EX OINT
1.0000 | TOPICAL_OINTMENT | Freq: Two times a day (BID) | CUTANEOUS | Status: DC
  Administered 2023-07-23 – 2023-07-26 (×6): 1 via NASAL
  Filled 2023-07-23 (×2): qty 22

## 2023-07-23 MED ORDER — SODIUM ZIRCONIUM CYCLOSILICATE 10 G PO PACK
10.0000 g | PACK | Freq: Once | ORAL | Status: AC
  Administered 2023-07-23: 10 g via ORAL
  Filled 2023-07-23: qty 1

## 2023-07-23 NOTE — Telephone Encounter (Signed)
 Reviewed and agree with Dr Macky Lower plan.

## 2023-07-23 NOTE — Assessment & Plan Note (Addendum)
 CT negative. Orthostatics positive. Suspect in the setting of infection.  - PT/OT to evaluate

## 2023-07-23 NOTE — Assessment & Plan Note (Addendum)
 Hg 8.6, seems lower than baseline of ~10-11. Patient does not endorse any GI or vaginal bleeding, though did complain about recent dark urine. UA negative for Hg. - continue iron supplementation  - PM CBC - will consider further work up inpatient after

## 2023-07-23 NOTE — Assessment & Plan Note (Deleted)
 Cr appears at baseline this AM. UA does not suggest hematuria, likely bleeding source is postmenopasual bleeding.  - Will need to ensure follow-up for abnormal uterine bleeding outpatient given she will have missed her appointment tomorrow

## 2023-07-23 NOTE — Progress Notes (Signed)
 Transition of Care [facility]) - Inpatient Brief Assessment   Patient Details  Name: [name] MRN: [id number] Date of Birth: [birth year]  [facility] CM/SW Contact:    Janae Bridgeman, RN Phone Number: 07/23/2023, 3:51 PM   Clinical Narrative: CM met with the patient at the bedside to discuss TOC needs.  The patient admitted to the hospital with pneumonia and plans to return home when medically stable for discharge.  Patient was evaluated by PT and [facility] PT was recommended.  I called Barbara Cower, RNCM with [facility] and he accepted for [facility] PT.  CM will follow the patient for RW/Rolator before discharge to home.  Patient is currently on oxygen due to the pnuemonia.  CM will continue to follow the patient for DMe for home including - RW/Rolator as she progresses.   Transition of Care Asessment: Insurance and Status: (P) Insurance coverage has been reviewed Patient has primary care physician: (P) Yes Home environment has been reviewed: (P) from home alone Prior level of function:: (P) HH assistance through [facility] PCS Services Prior/Current Home Services: (P) Current home services (PCS Services through [facility]) Social Drivers of Health Review: (P) SDOH reviewed needs interventions Readmission risk has been reviewed: (P) Yes Transition of care needs: (P) transition of care needs identified, TOC will continue to follow

## 2023-07-23 NOTE — Assessment & Plan Note (Addendum)
 S/p solumedrol.  - Continue IV levofloxacin, will consider adjusting following infection labs   - MRSA swab, Legionella Ag Ur, strep urine antigen - continue home dulera and duonebs q6h  - Continue home Singulair 10 mg nightly - follow up Bcx  - SLP eval ordered for concern of aspiration aspect to pneumonia

## 2023-07-23 NOTE — Assessment & Plan Note (Signed)
 Only reported 1 episode today.  Voluminous, nonbloody, and difficult to hold before getting to the bathroom.  In the context of immunosuppression and recent contact with travel, should consider infectious cause.  Reassuringly at this time without fever.  The Levaquin she is on for her pneumonia would cover some bacterial causes of diarrhea. - Continue to monitor, consider stool PCR testing if persistent

## 2023-07-23 NOTE — Assessment & Plan Note (Deleted)
 Only reported 1 episode today.  Voluminous, nonbloody, and difficult to hold before getting to the bathroom.  In the context of immunosuppression and recent contact with travel, should consider infectious cause.  Reassuringly at this time without fever.  The Levaquin she is on for her pneumonia would cover some bacterial causes of diarrhea. - Continue to monitor, consider stool PCR testing if persistent

## 2023-07-23 NOTE — Evaluation (Signed)
 Physical Therapy Evaluation Patient Details Name: [name] MRN: [id number] DOB: [birth year] Today's Date: 07/23/2023  History of Present Illness  Pt is 48 year old presented to [facility] on  07/22/23 for SOB. Per EMS SpO2 70%. Pt with PNA and diarrhea. Recent fall at home with head strike. Head CT normal. PMH - intellectual disability, liver transplant, schizophrenia, asthma, seizure, arthritis, ckd, fibromyalgia  Clinical Impression  Pt admitted with above diagnosis and presents to PT with functional limitations due to deficits listed below (See PT problem list). Pt needs skilled PT to maximize independence and safety. Pt typically amb without assistive device and lives alone with support of aides and uncle. Pt currently on stretcher in ED and with her short stature it was very difficult for her to get back on the stretcher. Will be able to better assess that mobility when on a regular hospital bed. Gait was also unsteady and pt very guarded. Next visit will try some type of assistive device to see if it improves. I am hopeful that pt will progress quickly and be able to return home with her supports in place. If she doesn't and is not able to mobilize on her own will need to consider alternate placement.           If plan is discharge home, recommend the following: A little help with bathing/dressing/bathroom;Assistance with cooking/housework;Assist for transportation;A little help with walking and/or transfers   Can travel by private vehicle        Equipment Recommendations Rolling walker (2 wheels);Rollator (4 wheels) (rolling walker vs rollator may be neede)  Recommendations for Other Services       Functional Status Assessment Patient has had a recent decline in their functional status and demonstrates the ability to make significant improvements in function in a reasonable and predictable amount of time.     Precautions / Restrictions Precautions Precautions:  Fall Restrictions Weight Bearing Restrictions Per Provider Order: No      Mobility  Bed Mobility Overal bed mobility: Needs Assistance Bed Mobility: Supine to Sit, Sit to Supine     Supine to sit: Min assist, HOB elevated Sit to supine: Max assist   General bed mobility comments: Assist to elevate trunk into sitting. Assist to bring legs back up to return to supine. Return to supine made much more difficult by stretcher being too high for pt and difficult for pt to get her hips up onto the stretcher    Transfers Overall transfer level: Needs assistance Equipment used: None Transfers: Sit to/from Stand Sit to Stand: Contact guard assist           General transfer comment: Assist for safety    Ambulation/Gait Ambulation/Gait assistance: Min assist Gait Distance (Feet): 10 Feet Assistive device: 1 person hand held assist Gait Pattern/deviations: Step-to pattern, Decreased step length - right, Decreased step length - left, Shuffle, Wide base of support Gait velocity: decr Gait velocity interpretation: <1.31 ft/sec, indicative of household ambulator   General Gait Details: Assist for balance. Pt holding my hand and with other hand holding on to edge of stretcher  Stairs            Wheelchair Mobility     Tilt Bed    Modified Rankin (Stroke Patients Only)       Balance Overall balance assessment: Needs assistance Sitting-balance support: No upper extremity supported, Feet unsupported Sitting balance-Leahy Scale: Fair     Standing balance support: Single extremity supported, During functional activity Standing balance-Leahy Scale:  Poor Standing balance comment: UE Support                             Pertinent Vitals/Pain Pain Assessment Pain Assessment: No/denies pain    Home Living Family/patient expects to be discharged to:: Private residence Living Arrangements: Alone Available Help at Discharge: Personal care attendant Type of  Home: House Home Access: Ramped entrance       Home Layout: One level Home Equipment: Shower seat Additional Comments: Aide 4hrs M-F and 2 hrs Sat-Sun. Uses [employer] for shopping. Uncle takes to [employer] shopping twice a month    Prior Function Prior Level of Function : Independent/Modified Independent;Needs assist             Mobility Comments: No assistive device ADLs Comments: Aide helps with shower, cooking etc     Extremity/Trunk Assessment   Upper Extremity Assessment Upper Extremity Assessment: Defer to OT evaluation    Lower Extremity Assessment Lower Extremity Assessment: Generalized weakness       Communication   Communication Communication: Impaired Factors Affecting Communication: Reduced clarity of speech    Cognition Arousal: Alert Behavior During Therapy: WFL for tasks assessed/performed   PT - Cognitive impairments: History of cognitive impairments                         Following commands: Impaired Following commands impaired: Follows one step commands with increased time     Cueing Cueing Techniques: Verbal cues     General Comments General comments (skin integrity, edema, etc.): VSS on 3l O2    Exercises     Assessment/Plan    PT Assessment Patient needs continued PT services  PT Problem List Decreased strength;Decreased activity tolerance;Decreased balance;Decreased mobility;Obesity;Decreased knowledge of use of DME       PT Treatment Interventions DME instruction;Gait training;Functional mobility training;Therapeutic activities;Therapeutic exercise;Balance training;Patient/family education    PT Goals (Current goals can be found in the Care Plan section)  Acute Rehab PT Goals Patient Stated Goal: return home PT Goal Formulation: With patient Time For Goal Achievement: 08/06/23 Potential to Achieve Goals: Good    Frequency Min 1X/week     Co-evaluation               AM-PAC PT "6 Clicks" Mobility  Outcome  Measure Help needed turning from your back to your side while in a flat bed without using bedrails?: A Little Help needed moving from lying on your back to sitting on the side of a flat bed without using bedrails?: A Little Help needed moving to and from a bed to a chair (including a wheelchair)?: A Little Help needed standing up from a chair using your arms (e.g., wheelchair or bedside chair)?: A Little Help needed to walk in hospital room?: Total Help needed climbing 3-5 steps with a railing? : Total 6 Click Score: 14    End of Session Equipment Utilized During Treatment: Oxygen Activity Tolerance: Patient limited by fatigue Patient left: in bed;with call bell/phone within reach   PT Visit Diagnosis: Unsteadiness on feet (R26.81);Other abnormalities of gait and mobility (R26.89);Muscle weakness (generalized) (M62.81)    Time: 4098-1191 PT Time Calculation (min) (ACUTE ONLY): 32 min   Charges:   PT Evaluation $PT Eval Moderate Complexity: 1 Mod PT Treatments $Gait Training: 8-22 mins PT General Charges $$ ACUTE PT VISIT: 1 Visit        [facility] PT Acute Rehabilitation Services Office  [phone]   Angelina Ok Maycok 07/23/2023, 10:03 AM

## 2023-07-23 NOTE — ED Notes (Signed)
Admitting at BS

## 2023-07-23 NOTE — Plan of Care (Signed)
 Pt alert x3, 2-3L-o2, HH-diet, pw- in place, plan-barium swallow

## 2023-07-23 NOTE — Evaluation (Signed)
 Clinical/Bedside Swallow Evaluation Patient Details  Name: [name] MRN: [id number] Date of Birth: 02/26/1976  Today's Date: 07/23/2023 Time: SLP Start Time (ACUTE ONLY): 1324 SLP Stop Time (ACUTE ONLY): 1343 SLP Time Calculation (min) (ACUTE ONLY): 19 min  Past Medical History:  Past Medical History:  Diagnosis Date   Allergic reaction to vaccine 09/11/2021   Asthma, well controlled, moderate persistent    followed by dr Lucie Leather (allergy/ asthma center) and pcp   Chronic constipation    CKD (chronic kidney disease), stage III [facility])    nephrologist @ [name]--- yasmeen murray-hyman   Cough, unspecified type    Delay in development 07/12/2013   Fibromyalgia    GERD (gastroesophageal reflux disease)    GI-- dr w. outlaw   History of Bell's palsy 11/2018   left side   History of gastric ulcer    IDA (iron deficiency anemia)    Immunosuppression due to drug therapy (HCC)    Inflammatory dermatosis    hands   Intellectual disability    developmental delay   Liver transplant recipient [facility]) 06/2013   followed by [name]--- Dr Maryan Char;   for ESLD secondary PBC   Morbid obesity (HCC) 07/25/2006   Qualifier: Diagnosis of   By: Mannie Stabile MD, JOSEPH         OA (osteoarthritis)    OSA (obstructive sleep apnea)    study many years ago,  no cpap  (study available)   Perennial allergic rhinitis    PMB (postmenopausal bleeding)    PONV (postoperative nausea and vomiting)    Primary biliary cirrhosis (HCC) 2012   s/p  liver transplant in 2015   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 Dulce Sellar   ESOPHAGOGASTRODUODENOSCOPY  (EGD) WITH PROPOFOL N/A 10/01/2012   Procedure: ESOPHAGOGASTRODUODENOSCOPY (EGD) WITH PROPOFOL;  Surgeon: Willis Modena, MD;  Location: [facility];  Service: Endoscopy;  Laterality: N/A;   IR US LIVER BIOPSY  04/24/2011   @ [facility]   LAPAROSCOPIC CHOLECYSTECTOMY  12/29/2010   @ [facility] by dr d. Magnus Ivan;   AND LAPAROSCOPIC APPENDECTOMY   LIVER TRANSPLANT  07/11/2012   @ [name]   ;   Orthotopic   STRABISMUS SURGERY     CHILD   TYMPANOSTOMY TUBE PLACEMENT Bilateral    CHILD   HPI:  [name] is a 48 yo female presenting to ED 2/24 with SOB and persistent cough. Recent fall at home with head strike. CT Chest shows dense consolidation in the R lower lung with infiltrates throughout the remainder of both lungs. PMH includes schizophrenia, PVC, anemia, GERD, asthma, seizures, arthritis, CKD, fibromyalgia, intellectual disability, liver transplant    Assessment / Plan / Recommendation  Clinical Impression  Pt reports new onset of coughing with PO intake. Per RN, she had significant bouts of coughing when taking pills with liquid. She had a delayed cough following all trials of thin liquids and solids. It was sustained and resembled a huff. Given pt's complaints of similar occurrences recently, discussed option to complete an MBS for further evaluation. She is in agreement  to proceed with MBS, which SLP will f/u to complete as scheduling allows (tentatively next date). Pending results, recommend she continue her current diet with pills given whole in puree as needed. SLP Visit Diagnosis: Dysphagia, unspecified (R13.10)    Aspiration Risk  Mild aspiration risk    Diet Recommendation Regular;Thin liquid    Liquid Administration via: Cup;Straw Medication Administration: Whole meds with puree Supervision: Patient able to self feed;Intermittent supervision to cue for compensatory strategies Compensations: Minimize environmental distractions;Slow rate;Small sips/bites Postural Changes: Seated upright at  90 degrees    Other  Recommendations Oral Care Recommendations: Oral care BID    Recommendations for follow up therapy are one component of a multi-disciplinary discharge planning process, led by the attending physician.  Recommendations may be updated based on patient status, additional functional criteria and insurance authorization.  Follow up Recommendations Other (comment) (TBD pending MBS results)      Assistance Recommended at Discharge    Functional Status Assessment Patient has had a recent decline in their functional status and demonstrates the ability to make significant improvements in function in a reasonable and predictable amount of time.  Frequency and Duration min 2x/week  1 week       Prognosis Prognosis for improved oropharyngeal function: Good Barriers to Reach Goals: Cognitive deficits;Time post onset      Swallow Study   General HPI: [name] is a 48 yo female presenting to ED 2/24 with SOB and persistent cough. Recent fall at home with head strike. CT Chest shows dense consolidation in the R lower lung with infiltrates throughout the remainder of both lungs. PMH includes schizophrenia, PVC, anemia, GERD, asthma, seizures, arthritis, CKD, fibromyalgia, intellectual disability, liver transplant Type of Study: Bedside Swallow Evaluation Previous Swallow Assessment: none in chart Diet Prior to this Study: Regular;Thin liquids (Level 0) Temperature Spikes Noted: No Respiratory Status: Nasal cannula History of Recent Intubation: No Behavior/Cognition: Alert;Cooperative;Pleasant mood Oral Cavity Assessment: Within Functional Limits Oral Care Completed by SLP: No Oral Cavity - Dentition: Missing dentition;Poor condition Vision: Functional for self-feeding Self-Feeding Abilities: Able to feed self Patient Positioning: Upright in bed Baseline Vocal Quality: Normal Volitional Cough: Strong Volitional Swallow: Able to elicit    Oral/Motor/Sensory Function  Overall Oral Motor/Sensory Function: Within functional limits   Ice Chips Ice chips: Not tested   Thin Liquid Thin Liquid: Impaired Presentation: Straw;Self Fed Pharyngeal  Phase Impairments: Cough - Delayed    Nectar Thick Nectar Thick Liquid: Not tested   Honey Thick Honey Thick Liquid: Not tested   Puree Puree: Not tested   Solid     Solid: Within functional limits Presentation: Self Fed      Gwynneth Aliment, M.A., CF-SLP Speech Language Pathology, Acute Rehabilitation Services  Secure Chat preferred [phone]  07/23/2023,2:14 PM

## 2023-07-23 NOTE — Evaluation (Signed)
 Occupational Therapy Evaluation Patient Details Name: [name] MRN: [id number] DOB: [birth year] Today's Date: 07/23/2023   History of Present Illness   Pt is 48 year old presented to [facility] on  07/22/23 for SOB. Per EMS SpO2 70%. Pt with PNA and diarrhea. Recent fall at home with head strike. Head CT normal. PMH - intellectual disability, liver transplant, schizophrenia, asthma, seizure, arthritis, ckd, fibromyalgia     Clinical Impressions Patient admitted for the diagnosis above.  PTA she lives at home with support from an Susank, PCA assist, and strong church support.  Patient is moving around the room/toileting with generalized supervision, she presents a little unsteady, but no LOB noted.  Patient did need Min A for pant management post BSC use, but this may be baseline given PCA support.  OT will follow in the acute setting to address deficits, but no post acute OT is anticipated.       If plan is discharge home, recommend the following:   Assist for transportation     Functional Status Assessment   Patient has had a recent decline in their functional status and demonstrates the ability to make significant improvements in function in a reasonable and predictable amount of time.     Equipment Recommendations   None recommended by OT     Recommendations for Other Services         Precautions/Restrictions   Precautions Precautions: Fall Restrictions Weight Bearing Restrictions Per Provider Order: No     Mobility Bed Mobility Overal bed mobility: Needs Assistance Bed Mobility: Supine to Sit     Supine to sit: Supervision          Transfers Overall transfer level: Needs assistance Equipment used: None Transfers: Sit to/from Stand, Bed to chair/wheelchair/BSC Sit to Stand: Supervision     Step pivot transfers: Supervision            Balance Overall balance assessment: Needs assistance Sitting-balance support: No upper extremity  supported, Feet unsupported Sitting balance-Leahy Scale: Good     Standing balance support: No upper extremity supported Standing balance-Leahy Scale: Fair                             ADL either performed or assessed with clinical judgement   ADL       Grooming: Supervision/safety;Standing               Lower Body Dressing: Minimal assistance;Sit to/from stand   Toilet Transfer: Retail banker;Ambulation                   Vision Baseline Vision/History: 1 Wears glasses Patient Visual Report: No change from baseline       Perception Perception: Not tested       Praxis Praxis: Not tested       Pertinent Vitals/Pain Pain Assessment Pain Assessment: No/denies pain     Extremity/Trunk Assessment Upper Extremity Assessment Upper Extremity Assessment: Overall WFL for tasks assessed   Lower Extremity Assessment Lower Extremity Assessment: Defer to PT evaluation   Cervical / Trunk Assessment Cervical / Trunk Assessment: Normal   Communication Communication Communication: Impaired Factors Affecting Communication: Reduced clarity of speech   Cognition Arousal: Alert Behavior During Therapy: WFL for tasks assessed/performed Cognition: History of cognitive impairments  Following commands impaired: Follows one step commands with increased time     Cueing  General Comments   Cueing Techniques: Verbal cues   VSS on supplemental O2   Exercises     Shoulder Instructions      Home Living Family/patient expects to be discharged to:: Private residence Living Arrangements: Alone Available Help at Discharge: Personal care attendant Type of Home: House Home Access: Ramped entrance     Home Layout: One level     Bathroom Shower/Tub: Chief Strategy Officer: Handicapped height     Home Equipment: Information systems manager   Additional Comments: Aide 4hrs M-F and 2 hrs  Sat-Sun. Uses [employer] for shopping. Uncle takes to [employer] shopping twice a month      Prior Functioning/Environment Prior Level of Function : Independent/Modified Independent;Needs assist             Mobility Comments: No assistive device ADLs Comments: Aide helps with shower, cooking etc    OT Problem List: Decreased activity tolerance   OT Treatment/Interventions: Self-care/ADL training;Therapeutic activities;Balance training      OT Goals(Current goals can be found in the care plan section)   Acute Rehab OT Goals Patient Stated Goal: Return home OT Goal Formulation: With patient Time For Goal Achievement: 08/06/23 Potential to Achieve Goals: Good ADL Goals Pt Will Perform Grooming: with modified independence;standing Pt Will Perform Lower Body Dressing: with modified independence;sit to/from stand Pt Will Transfer to Toilet: with modified independence;regular height toilet;ambulating   OT Frequency:  Min 1X/week    Co-evaluation              AM-PAC OT "6 Clicks" Daily Activity     Outcome Measure Help from another person eating meals?: None Help from another person taking care of personal grooming?: A Little Help from another person toileting, which includes using toliet, bedpan, or urinal?: A Little Help from another person bathing (including washing, rinsing, drying)?: A Little Help from another person to put on and taking off regular upper body clothing?: None Help from another person to put on and taking off regular lower body clothing?: A Little 6 Click Score: 20   End of Session Equipment Utilized During Treatment: Oxygen Nurse Communication: Mobility status  Activity Tolerance: Patient tolerated treatment well Patient left: in chair;with call bell/phone within reach;with family/visitor present  OT Visit Diagnosis: Unsteadiness on feet (R26.81)                Time: 1510-1531 OT Time Calculation (min): 21 min Charges:  OT General Charges $OT Visit:  1 Visit OT Evaluation $OT Eval Moderate Complexity: 1 Mod  07/23/2023  RP, OTR/L  Acute Rehabilitation Services  Office:  [phone]   [name] 07/23/2023, 3:35 PM

## 2023-07-23 NOTE — Progress Notes (Cosign Needed)
 Daily Progress Note Intern Pager: [phone]  Patient name: [name] Medical record number: [id number] Date of birth: 11-02-1975 Age: 48 y.o. 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   Assessment & Plan CAP (community acquired pneumonia) S/p solumedrol.  - Continue IV levofloxacin, will consider adjusting following infection labs   - MRSA swab, Legionella Ag Ur, strep urine antigen - continue home dulera and duonebs q6h  - Continue home Singulair 10 mg nightly - follow up Bcx  - SLP eval ordered for concern of aspiration aspect to pneumonia   Fall CT negative. Orthostatics positive. Suspect in the setting of infection.  - PT/OT to evaluate Hyperkalemia K 5.2 >> 5.4. now s/p 5mg  lokelma. - 10 mg lokelma - f/u  AM BMP  Anemia Hg 8.6, seems lower than baseline of ~10-11. Patient does not endorse any GI or vaginal bleeding, though did complain about recent dark urine. UA negative for Hg. - continue iron supplementation  - PM CBC - will consider further work up inpatient after    Chronic and Stable Issues: Liver transplant recipient (2015): Continue cyclosporine 100 mg twice daily for now Asthma with allergic rhinitis: Continue home asthma regimen as above.  Schizophrenia, paranoid type: Continue Risperdal 1.5 mg every morning and 3 mg every night and benztropine 1 mg BID  Seizure disorder: Continue Depakote ER 1 g daily.  Hypothyroidism: Continue Synthroid 75 mcg daily. GERD/LPR: continue BID dosing of famotidine and protonix   FEN/GI: HH PPx: Lovenox Dispo: home pending  clinical improvement  Subjective:  Patient thinks she is getting better with the oxygen and duonebs. Still has some coughing but feels much better   Objective: Temp:  [98.5 F (36.9 C)-101.2 F (38.4 C)] 98.9 F (37.2 C) (02/25 0655) Pulse Rate:  [29-112] 81 (02/25 0715) Resp:  [17-50] 25 (02/25 0715) BP: (102-156)/(72-121) 149/90 (02/25 0645) SpO2:  [91 %-100 %] 100 % (02/25 0715) Physical Exam: General: chronically ill appearing, NAD Cardiovascular: RRR, no m/r/g Respiratory: intermittently tachypnea with coughing, rhonchi at right base, end expiratory wheezing bilaterally  Abdomen: soft, NTND Extremities: no bilateral edema noted   Laboratory: Most recent CBC Lab Results  Component Value Date   WBC 4.5 07/23/2023   HGB 8.6 (L) 07/23/2023   HCT 26.4 (L) 07/23/2023   MCV 88.3 07/23/2023   PLT 219 07/23/2023   Most recent BMP    Latest Ref Rng & Units 07/23/2023    5:32 AM  BMP  Glucose 70 - 99 mg/dL 630   BUN 6 - 20 mg/dL 32   Creatinine 1.60 - 1.00 mg/dL 1.09   Sodium 323 - 557 mmol/L 140   Potassium 3.5 - 5.1 mmol/L 5.4   Chloride 98 - 111 mmol/L 113   CO2 22 - 32 mmol/L 22   Calcium 8.9 - 10.3 mg/dL 7.6     Other pertinent labs   UA:  Urinalysis, w/ Reflex to Culture (Infection Suspected) -Urine, Clean Catch [[id number]] (Abnormal) Collected: 07/23/23 0606  Specimen: Urine, Clean Catch Updated: 07/23/23 0634   Specimen Source URINE, CLEAN CATCH   Color, Urine YELLOW   APPearance HAZY Abnormal  Specific Gravity, Urine 1.014   pH 5.0   Glucose, UA NEGATIVE mg/dL    Hgb urine dipstick NEGATIVE   Bilirubin Urine NEGATIVE   Ketones, ur NEGATIVE mg/dL    Protein, ur NEGATIVE mg/dL    Nitrite NEGATIVE   Leukocytes,Ua NEGATIVE   RBC / HPF 0-5 RBC/hpf    WBC, UA 6-10 WBC/hpf    Bacteria, UA RARE Abnormal    Squamous Epithelial / HPF 6-10 /HPF    Mucus PRESENT   Bcx: NGTD    Imaging/Diagnostic Tests: No new imaging  Georg Ruddle Wiletta Bermingham, MD 07/23/2023,  7:51 AM  PGY-1,  Family Medicine FPTS Intern pager: [phone], text pages welcome Secure chat group [facility] Teaching Service

## 2023-07-23 NOTE — Assessment & Plan Note (Addendum)
 K 5.2 >> 5.4. now s/p 5mg  lokelma. - 10 mg lokelma - f/u  AM BMP

## 2023-07-24 ENCOUNTER — Inpatient Hospital Stay (HOSPITAL_COMMUNITY): Payer: Medicaid Other

## 2023-07-24 DIAGNOSIS — J154 Pneumonia due to other streptococci: Secondary | ICD-10-CM | POA: Diagnosis not present

## 2023-07-24 DIAGNOSIS — R1310 Dysphagia, unspecified: Secondary | ICD-10-CM

## 2023-07-24 DIAGNOSIS — Z944 Liver transplant status: Secondary | ICD-10-CM | POA: Diagnosis not present

## 2023-07-24 DIAGNOSIS — J9601 Acute respiratory failure with hypoxia: Secondary | ICD-10-CM | POA: Diagnosis not present

## 2023-07-24 LAB — BASIC METABOLIC PANEL
Anion gap: 9 (ref 5–15)
BUN: 37 mg/dL — ABNORMAL HIGH (ref 6–20)
CO2: 25 mmol/L (ref 22–32)
Calcium: 8.7 mg/dL — ABNORMAL LOW (ref 8.9–10.3)
Chloride: 109 mmol/L (ref 98–111)
Creatinine, Ser: 1.18 mg/dL — ABNORMAL HIGH (ref 0.44–1.00)
GFR, Estimated: 57 mL/min — ABNORMAL LOW (ref >60)
Glucose, Bld: 164 mg/dL — ABNORMAL HIGH (ref 70–99)
Potassium: 5.1 mmol/L (ref 3.5–5.1)
Sodium: 143 mmol/L (ref 135–145)

## 2023-07-24 LAB — CBC
HCT: 26.1 % — ABNORMAL LOW (ref 36.0–46.0)
Hemoglobin: 8.4 g/dL — ABNORMAL LOW (ref 12.0–15.0)
MCH: 28.3 pg (ref 26.0–34.0)
MCHC: 32.2 g/dL (ref 30.0–36.0)
MCV: 87.9 fL (ref 80.0–100.0)
Platelets: 246 10*3/uL (ref 150–400)
RBC: 2.97 MIL/uL — ABNORMAL LOW (ref 3.87–5.11)
RDW: 13.5 % (ref 11.5–15.5)
WBC: 5.3 10*3/uL (ref 4.0–10.5)
nRBC: 0.6 % — ABNORMAL HIGH (ref 0.0–0.2)

## 2023-07-24 LAB — LEGIONELLA PNEUMOPHILA SEROGP 1 UR AG
L. pneumophila Serogp 1 Ur Ag: NEGATIVE
L. pneumophila Serogp 1 Ur Ag: NEGATIVE

## 2023-07-24 LAB — PROTIME-INR
INR: 1.1 (ref 0.8–1.2)
Prothrombin Time: 14.2 s (ref 11.4–15.2)

## 2023-07-24 NOTE — Assessment & Plan Note (Deleted)
 Strep pneumo urine antigen positive. -

## 2023-07-24 NOTE — Assessment & Plan Note (Deleted)
 K 5.2 >> 5.4. now s/p 5mg  lokelma. - 10 mg lokelma - f/u  AM BMP

## 2023-07-24 NOTE — Assessment & Plan Note (Addendum)
-   given patient is increased risk for infection, will continue treatment with PO levofloxacin (12/24 - 12/29), consider switching to PO after swallow study today  - continue home dulera and duonebs q6h  - Continue home Singulair 10 mg nightly - follow up Bcx  - SLP following

## 2023-07-24 NOTE — Assessment & Plan Note (Deleted)
 Only reported 1 episode today.  Voluminous, nonbloody, and difficult to hold before getting to the bathroom.  In the context of immunosuppression and recent contact with travel, should consider infectious cause.  Reassuringly at this time without fever.  The Levaquin she is on for her pneumonia would cover some bacterial causes of diarrhea. - Continue to monitor, consider stool PCR testing if persistent

## 2023-07-24 NOTE — Progress Notes (Signed)
 Daily Progress Note Intern Pager: [phone]  Patient name: [name] Medical record number: [id number] Date of birth: [birth year] Age: 48 y.o. Gender: female  Primary Care Provider: [name], DO Consultants: SLP Code Status: Full  Pt Overview and Major Events to Date:  2/24: admitted  Assessment and Plan: Patient is a 48 year old F with PMHx of primary biliary cholangitis s/p liver transplant (2015), chronic invasive therapy, asthma, schizophrenia, CKD 3, intellectual disability admitted for likely strep pneumo CAP.  Assessment & Plan Multifocal pneumonia - given patient is increased risk for infection, will continue treatment with PO levofloxacin (12/24 - 12/29), consider switching to PO after swallow study today  - continue home dulera and duonebs q6h  - Continue home Singulair 10 mg nightly - follow up Bcx  - SLP following   Hyperkalemia K 5.2 >> 5.4. now s/p Lokelma.  AM K wnl.  - continue AM BMP Anemia Hg 8.6 on admission, stable from yesterday at 8.4. Etiology remains unclear  Appears to be around 10-11 discussed possible source with patient, given she was supposed to undergo dilatation and curettage for suspected abnormal uterine bleeding yesterday prior to being admitted to the hospital.  - continue iron supplementation  - consider outpatient evaluation   Dysphagia Patient complaining of dysphagia, with significant bouts of coughing with taking pills with liquids.  SLP following who recommended MBS. -Patient to have swallow study today -SLP following  Chronic and Stable Issues: Liver transplant recipient (2015): Continue cyclosporine 100 mg twice daily for now Asthma with allergic rhinitis: Continue home asthma regimen as above.  Schizophrenia, paranoid type: Continue Risperdal 1.5 mg every morning and 3 mg every night and benztropine 1 mg BID  Seizure disorder: Continue Depakote ER 1 g daily.  Hypothyroidism: Continue Synthroid 75 mcg  daily. GERD/LPR: continue BID dosing of famotidine and protonix  FEN/GI: Heart healthy, thin fluids PPx: Lovenox Dispo: Home with home health?  Pending clinical improvement  Subjective:  Patient reports she is feeling okay this AM. Has not yet received menu for breakfast. Is coughing with some phlegm production.   Objective: Temp:  [98 F (36.7 C)-98.8 F (37.1 C)] 98.5 F (36.9 C) (02/26 0551) Pulse Rate:  [77-100] 98 (02/26 0551) Resp:  [18-28] 18 (02/26 0551) BP: (125-163)/(73-101) 125/79 (02/26 0551) SpO2:  [95 %-100 %] 98 % (02/26 0551) FiO2 (%):  [32 %] 32 % (02/25 1417) Weight:  [103 kg] 103 kg (02/25 1300) Physical Exam: General: chronically ill appearing, NAD Cardiovascular: RRR, no m/r/g Respiratory: rhonchi at left base, end expiratory wheezing throughout Abdomen: soft, NTND Extremities: no lower extremity edema noted   Laboratory: Most recent CBC Lab Results  Component Value Date   WBC 5.3 07/24/2023   HGB 8.4 (L) 07/24/2023   HCT 26.1 (L) 07/24/2023   MCV 87.9 07/24/2023   PLT 246 07/24/2023   Most recent BMP    Latest Ref Rng & Units 07/23/2023    5:32 AM  BMP  Glucose 70 - 99 mg/dL 784   BUN 6 - 20 mg/dL 32   Creatinine 6.96 - 1.00 mg/dL 2.95   Sodium 284 - 132 mmol/L 140   Potassium 3.5 - 5.1 mmol/L 5.4   Chloride 98 - 111 mmol/L 113   CO2 22 - 32 mmol/L 22   Calcium 8.9 - 10.3 mg/dL 7.6     Other pertinent labs  Strep pneumoniae urine antigen: Positive MRSA swab: Negative Blood culture: No growth to date  Imaging/Diagnostic Tests:  No new images Penne Lash, MD 07/24/2023, 7:57 AM  PGY-1, [facility] Family Medicine FPTS Intern pager: [phone], text pages welcome Secure chat group [facility] Teaching Service

## 2023-07-24 NOTE — Assessment & Plan Note (Deleted)
 Hg 8.6, seems lower than baseline of ~10-11. Patient does not endorse any GI or vaginal bleeding, though did complain about recent dark urine. UA negative for Hg. - continue iron supplementation  - PM CBC - will consider further work up inpatient after

## 2023-07-24 NOTE — Assessment & Plan Note (Deleted)
 CT negative. Orthostatics positive. Suspect in the setting of infection.  - PT/OT to evaluate

## 2023-07-24 NOTE — Assessment & Plan Note (Deleted)
 Patient reports recent dark urine.  Previous diagnosis of CKD 3.  Creatinine 1.52, unchanged from 6 days ago.  Appears the baseline ~ 1.08 about 5 months ago.  No dysuria.  Possibly secondary to post-menopausal bleeding, UTI, cyclosporine therapy as above. Was scheduled for endometrial biopsy tomorrow 2/25.  - UA placed to further evaluate - Continue IV fluids as above, consider impact of cyclosporine on renal function if creatinine persistently elevated - Will need to ensure follow-up for abnormal uterine bleeding outpatient given she will have missed her appointment tomorrow

## 2023-07-24 NOTE — Assessment & Plan Note (Deleted)
 Unsure if at baseline in setting of recent fall. Alert and oriented x 4 with [place] on exam though speech is slowed and at times unintelligible. - CT head as above

## 2023-07-24 NOTE — Assessment & Plan Note (Addendum)
 Hg 8.6 on admission, stable from yesterday at 8.4. Etiology remains unclear  Appears to be around 10-11 discussed possible source with patient, given she was supposed to undergo dilatation and curettage for suspected abnormal uterine bleeding yesterday prior to being admitted to the hospital.  - continue iron supplementation  - consider outpatient evaluation

## 2023-07-24 NOTE — Progress Notes (Addendum)
 Physical Therapy Treatment Patient Details Name: [name] MRN: [id number] DOB: Nov 14, 1975 Today's Date: 07/24/2023   History of Present Illness Pt is 48 year old [facility]  [facility] for SOB. Per EMS SpO2 70%. Pt with PNA and diarrhea. Recent fall at home with head strike. Head CT normal. PMH - intellectual disability, liver transplant, schizophrenia, asthma, seizure, arthritis, ckd, fibromyalgia    PT Comments  As hoped pt performed better today from a regular bed instead of stretcher in ED. Did use walker for gait which she didn't use baseline. Recommend rolling walker and HHPT at home. Able to tolerate activity on RA with SpO2 90% with amb. Expect continued progress. Several episodes of coughing.   If plan is discharge home, recommend the following: A little help with bathing/dressing/bathroom;Assistance with cooking/housework;Assist for transportation;A little help with walking and/or transfers   Can travel by private vehicle        Equipment Recommendations  Rolling walker (2 wheels)    Recommendations for Other Services       Precautions / Restrictions Precautions Precautions: Fall Restrictions Weight Bearing Restrictions Per Provider Order: No     Mobility  Bed Mobility Overal bed mobility: Needs Assistance Bed Mobility: Supine to Sit     Supine to sit: HOB elevated, Contact guard     General bed mobility comments: Incr time and effort    Transfers Overall transfer level: Needs assistance Equipment used: Rolling walker (2 wheels) Transfers: Sit to/from Stand, Bed to chair/wheelchair/BSC Sit to Stand: Contact guard assist           General transfer comment: Assist for safety. Pt used walker for bed to bsc.    Ambulation/Gait Ambulation/Gait assistance: Contact guard assist Gait Distance (Feet): 100 Feet Assistive device: Rolling walker (2 wheels) Gait Pattern/deviations: Wide base of support, Step-through pattern, Decreased stride  length Gait velocity: decr Gait velocity interpretation: <1.31 ft/sec, indicative of household ambulator   General Gait Details: Assist for safety   Stairs             Wheelchair Mobility     Tilt Bed    Modified Rankin (Stroke Patients Only)       Balance Overall balance assessment: Needs assistance Sitting-balance support: No upper extremity supported, Feet unsupported Sitting balance-Leahy Scale: Fair     Standing balance support: Single extremity supported, During functional activity Standing balance-Leahy Scale: Poor Standing balance comment: UE Support                            Communication Communication Communication: Impaired Factors Affecting Communication: Reduced clarity of speech  Cognition Arousal: Alert Behavior During Therapy: WFL for tasks assessed/performed   PT - Cognitive impairments: History of cognitive impairments                         Following commands: Impaired Following commands impaired: Only follows one step commands consistently, Follows one step commands with increased time    Cueing Cueing Techniques: Verbal cues  Exercises      General Comments General comments (skin integrity, edema, etc.): SpO2 96% at rest on O2, 94% at rest on RA. Amb on RA with SpO2 90%      Pertinent Vitals/Pain      Home Living                          Prior Function  PT Goals (current goals can now be found in the care plan section) Acute Rehab PT Goals Patient Stated Goal: return home Progress towards PT goals: Progressing toward goals    Frequency    Min 1X/week      PT Plan      Co-evaluation              AM-PAC PT "6 Clicks" Mobility   Outcome Measure  Help needed turning from your back to your side while in a flat bed without using bedrails?: A Little Help needed moving from lying on your back to sitting on the side of a flat bed without using bedrails?: A Little Help  needed moving to and from a bed to a chair (including a wheelchair)?: A Little Help needed standing up from a chair using your arms (e.g., wheelchair or bedside chair)?: A Little Help needed to walk in hospital room?: A Little Help needed climbing 3-5 steps with a railing? : A Lot 6 Click Score: 17    End of Session   Activity Tolerance: Patient tolerated treatment well Patient left: in chair;with chair alarm set;with call bell/phone within reach Nurse Communication: Mobility status;Other (comment) (Left O2 off) PT Visit Diagnosis: Unsteadiness on feet (R26.81);Other abnormalities of gait and mobility (R26.89);Muscle weakness (generalized) (M62.81)     Time: 1000-1028 PT Time Calculation (min) (ACUTE ONLY): 28 min  Charges:    $Gait Training: 23-37 mins PT General Charges $$ ACUTE PT VISIT: 1 Visit                     [facility] PT Acute Rehabilitation Services Office [phone]    Angelina Ok [facility] 07/24/2023, 4:10 PM

## 2023-07-24 NOTE — Assessment & Plan Note (Signed)
 Patient complaining of dysphagia, with significant bouts of coughing with taking pills with liquids.  SLP following who recommended MBS. -Patient to have swallow study today -SLP following

## 2023-07-24 NOTE — Progress Notes (Signed)
 Modified Barium Swallow Study  Patient Details  Name: [name] MRN: [id number] Date of Birth: 04-Dec-1975  Today's Date: 07/24/2023  Modified Barium Swallow completed.  Full report located under Chart Review in the Imaging Section.  History of Present Illness [name] is a 48 yo female presenting to ED 2/24 with SOB and persistent cough. Recent fall at home with head strike. CT Chest shows dense consolidation in the R lower lung with infiltrates throughout the remainder of both lungs. PMH includes schizophrenia, PVC, anemia, GERD, asthma, seizures, arthritis, CKD, fibromyalgia, intellectual disability, liver transplant   Clinical Impression Pt presents with an overall functional oropharyngeal swallow. There is flash penetration with thin liquids (PAS 2), which is considered WFL. When pt was swallowing the barium tablet with thin liquids, she appeared to gag and then expectorated the pill while swallowing the thin liquid bolus. Even in this situation, she protected her airway adequately. She has mild pharyngeal residue that clears with an independently initiated subswallow. Recommend she continue her regular diet with thin liquids. Provided education regarding taking meds with purees. No SLP f/u is needed, will s/o. Factors that may increase risk of adverse event in presence of aspiration [name] & [name] 2021): Weak cough  Swallow Evaluation Recommendations Recommendations: PO diet PO Diet Recommendation: Regular;Thin liquids (Level 0) Liquid Administration via: Cup;Straw Medication Administration: Whole meds with puree Supervision: Patient able to self-feed;Set-up assistance for safety Swallowing strategies  : Minimize environmental distractions;Slow rate;Small bites/sips Postural changes: Position pt fully upright for meals Oral care recommendations: Oral care BID (2x/day)      [name], M.A., CF-SLP Speech Language Pathology, Acute Rehabilitation Services  Secure  Chat preferred [phone]  07/24/2023,3:23 PM

## 2023-07-24 NOTE — Assessment & Plan Note (Addendum)
 K 5.2 >> 5.4. now s/p Lokelma.  AM K wnl.  - continue AM BMP

## 2023-07-25 DIAGNOSIS — J154 Pneumonia due to other streptococci: Secondary | ICD-10-CM | POA: Diagnosis not present

## 2023-07-25 DIAGNOSIS — J9601 Acute respiratory failure with hypoxia: Secondary | ICD-10-CM | POA: Diagnosis not present

## 2023-07-25 DIAGNOSIS — J4541 Moderate persistent asthma with (acute) exacerbation: Secondary | ICD-10-CM | POA: Diagnosis not present

## 2023-07-25 DIAGNOSIS — J189 Pneumonia, unspecified organism: Secondary | ICD-10-CM | POA: Diagnosis not present

## 2023-07-25 LAB — BASIC METABOLIC PANEL
Anion gap: 14 (ref 5–15)
BUN: 22 mg/dL — ABNORMAL HIGH (ref 6–20)
CO2: 23 mmol/L (ref 22–32)
Calcium: 8.7 mg/dL — ABNORMAL LOW (ref 8.9–10.3)
Chloride: 102 mmol/L (ref 98–111)
Creatinine, Ser: 1.07 mg/dL — ABNORMAL HIGH (ref 0.44–1.00)
GFR, Estimated: >60 mL/min (ref >60)
Glucose, Bld: 164 mg/dL — ABNORMAL HIGH (ref 70–99)
Potassium: 4.7 mmol/L (ref 3.5–5.1)
Sodium: 139 mmol/L (ref 135–145)

## 2023-07-25 LAB — CBC
HCT: 26.6 % — ABNORMAL LOW (ref 36.0–46.0)
Hemoglobin: 8.6 g/dL — ABNORMAL LOW (ref 12.0–15.0)
MCH: 28.3 pg (ref 26.0–34.0)
MCHC: 32.3 g/dL (ref 30.0–36.0)
MCV: 87.5 fL (ref 80.0–100.0)
Platelets: 256 10*3/uL (ref 150–400)
RBC: 3.04 MIL/uL — ABNORMAL LOW (ref 3.87–5.11)
RDW: 13.3 % (ref 11.5–15.5)
WBC: 4.4 10*3/uL (ref 4.0–10.5)
nRBC: 0.5 % — ABNORMAL HIGH (ref 0.0–0.2)

## 2023-07-25 MED ORDER — UMECLIDINIUM BROMIDE 62.5 MCG/ACT IN AEPB
1.0000 | INHALATION_SPRAY | Freq: Every day | RESPIRATORY_TRACT | Status: DC
  Administered 2023-07-26: 1 via RESPIRATORY_TRACT
  Filled 2023-07-25: qty 7

## 2023-07-25 MED ORDER — IPRATROPIUM-ALBUTEROL 0.5-2.5 (3) MG/3ML IN SOLN: 3.0000 mL | Freq: Two times a day (BID) | RESPIRATORY_TRACT | Status: DC

## 2023-07-25 MED ORDER — IPRATROPIUM-ALBUTEROL 0.5-2.5 (3) MG/3ML IN SOLN: 3.0000 mL | Freq: Four times a day (QID) | RESPIRATORY_TRACT | Status: DC | PRN

## 2023-07-25 MED ORDER — UMECLIDINIUM BROMIDE 62.5 MCG/ACT IN AEPB
1.0000 | INHALATION_SPRAY | Freq: Every day | RESPIRATORY_TRACT | Status: DC
  Filled 2023-07-25: qty 7

## 2023-07-25 NOTE — Progress Notes (Signed)
 Nurse requested Mobility Specialist to perform oxygen saturation test with pt which includes removing pt from oxygen both at rest and while ambulating.  Below are the results from that testing.     Patient Saturations on Room Air at Rest = spO2 89%  Patient Saturations on 1 Liters of oxygen while Ambulating = sp02 92%  At end of testing pt left in room on 1  Liters of oxygen.  Reported results to nurse.

## 2023-07-25 NOTE — Progress Notes (Signed)
 Mobility Specialist: Progress Note   07/25/23 1253  Mobility  Activity Ambulated with assistance in hallway  Level of Assistance Contact guard assist, steadying assist  Assistive Device Front wheel walker  Distance Ambulated (ft) 70 ft  Activity Response Tolerated well  Mobility Referral Yes  Mobility visit 1 Mobility  Mobility Specialist Start Time (ACUTE ONLY) 1106  Mobility Specialist Stop Time (ACUTE ONLY) 1131  Mobility Specialist Time Calculation (min) (ACUTE ONLY) 25 min    Pt was agreeable to mobility session - received in bed. Completed ambulatory sat test (see following note). CG throughout. SpO2 WFL on 1LO2. No complaints just had a few coughing spells throughout session. Returned to room without fault. Left in chair with all needs met, call bell in reach. Chair alarm on.   Maurene Capes Mobility Specialist Please contact via SecureChat or Rehab office at [phone]

## 2023-07-25 NOTE — TOC Progression Note (Signed)
 Transition of Care [facility]) - Progression Note    Patient Details  Name: [name] MRN: [id number] Date of Birth: [birth year]  Transition of Care [facility]) CM/SW Contact  Janae Bridgeman, RN Phone Number: 07/25/2023, 9:00 AM  Clinical Narrative:    Cm met with the patient at the bedside to discuss TOC needs.  The patient may likely discharge home today or tomorrow - depending on how she weans from the oxygen.  I spoke with the patient and called [employer] and ordered RW to be delivered to the bedside today.  Patient states that bedside nursing can call her uncle to provide transportation to home when she is medically stable to discharge home in the next 1-2 days.        Expected Discharge Plan and Services                                               Social Determinants of Health (SDOH) Interventions SDOH Screenings   Food Insecurity: No Food Insecurity (07/23/2023)  Housing: Low Risk  (07/23/2023)  Transportation Needs: No Transportation Needs (07/23/2023)  Utilities: Not At Risk (07/23/2023)  Depression (PHQ2-9): Low Risk  (06/04/2023)  Financial Resource Strain: Low Risk  (02/08/2021)  Stress: No Stress Concern Present (02/08/2021)  Tobacco Use: Low Risk  (07/23/2023)    Readmission Risk Interventions    07/23/2023    3:50 PM  Readmission Risk Prevention Plan  Transportation Screening Complete  PCP or Specialist Appt within 3-5 Days Complete  HRI or Home Care Consult Complete  Social Work Consult for Recovery Care Planning/Counseling Complete  Palliative Care Screening Complete

## 2023-07-25 NOTE — Discharge Summary (Signed)
 Family Medicine Teaching [facility] Discharge Summary  Patient name: [name] Medical record number: [id number] Date of birth: 21-Oct-1975 Age: 48 y.o. Gender: female Date of Admission: 07/22/2023  Date of Discharge: 07/25/2023 Admitting Physician: Nestor Ramp, MD  Primary Care Provider: [name], DO Consultants: SLP  Indication for Hospitalization: Shortness of breath and cough   Discharge Diagnoses/Problem List:  Principal Problem for Admission: Strep Pneumonia  Other Problems addressed during stay:  Principal Problem:   Streptococcal pneumonia (HCC) Active Problems:   Schizophrenia, paranoid type (HCC)   Anemia   Asthma with allergic rhinitis   Liver transplant recipient [facility])   Class 3 severe obesity with body mass index (BMI) of 45.0 to 49.9 in adult [facility])   Intellectual delay   Hematuria, CKD3   Fall   Hyperkalemia   Diarrhea   Hypoalbuminemia   Acute respiratory failure with hypoxia (HCC)   Moderate persistent asthma with exacerbation   Dysphagia    Brief Hospital Course:  [name] is a 48 y.o. female who was admitted to the [facility] Medicine Teaching Service at [facility] for multifocal pneumonia.  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  Hospital course is outlined below by problem.   Multifocal pneumonia Presented with cough and worsening shortness of breath.  She had an oxygen requirement of 3 L as well as wheezing and rhonchi on exam.  She was afebrile.  Labs overall reassuring with normal white blood cell count.  CT chest with confirmatory findings.  In the setting of immunosuppression, patient was started on Levaquin for total of 7 day dose (2/24 - 3/2). Given her wheezing, she was also given scheduled DuoNebs and continued on home asthma regimen.  Strep urine antigen was positive.   Order  SLP evaluation given patient has history of GERD with LPR and endorsed choking on certain foods.  Initial evaluation showed concern for delayed cough following all trials of thin liquids and solids.  SLP recommended MBS which showed no concern for increased aspiration risk.  Fall, intellectual delay Has a history of multiple documented falls.  Causes in the past felt to be due to tripping, orthostasis, seizures.  Orthostatic vital signs positive, likely in setting of acute illness. Given patient hit her head with the most acute fall, and her precise intellectual baseline was not known, CT head was obtained which demonstrated no acute findings. PT recommended home health PT, OT did not require follow-up.   Hyperkalemia Potassium elevated on admission to 5.2.  Patient received Lokelma, follow-up BMP showed normalization.    Anemia Endorsed red urine on admission.  Of note, AUB, to be contributing, as she was scheduled for endometrial biopsy under sedation on 2/25.  This was not performed given her inpatient admission.  UA negative for Hg - blood likely from AUB. Hg was 8.7 on admission and remained stable around 8-9, decreased from baseline of ~10-11.    Other conditions that were chronic and stable: Hypothyroidism, liver transplant recipient, schizophrenia, seizure disorder   Issues for follow up: Consider further work up of anemia if D&C does not warrant answers  Patient had 1 episode of hyperkalemia, resolved during admission. Consider f/u BMP  Disposition: home with home health  Discharge Condition: Stable   Discharge Exam:  Vitals:   07/25/23 0800 07/25/23 0843  BP:    Pulse:  88  Resp:  18  Temp:  SpO2: 93% 97%   ***  Significant Procedures:  MBS:   Significant Labs and Imaging:  Recent Labs  Lab 07/23/23 1547 07/24/23 0702 07/25/23 0651  WBC 4.7 5.3 4.4  HGB 8.9* 8.4* 8.6*  HCT 27.6* 26.1* 26.6*  PLT 247 246 256   Recent Labs  Lab 07/24/23 0702  07/25/23 0651  NA 143 139  K 5.1 4.7  CL 109 102  CO2 25 23  GLUCOSE 164* 164*  BUN 37* 22*  CREATININE 1.18* 1.07*  CALCIUM 8.7* 8.7*    Pertinent Imaging   Chest wo Contrast  Dense consolidation in the right lower lung with patchy infiltrates throughout the remainder of both lungs. This is likely multifocal pneumonia. Follow-up after resolution of acute process is recommended to exclude underlying focal lesion. Mild aortic atherosclerosis.   CT head Normal head CT   MBS: Recommendations for follow up therapy are one component of a multi-disciplinary discharge planning process, led by the attending physician.  Recommendations may be updated based on patient status, additional functional criteria and insurance authorization.   Results/Tests Pending at Time of Discharge: None  Discharge Medications:  Allergies as of 07/25/2023       Reactions   Tramadol Hcl Itching, Nausea And Vomiting   Causes SEIZURES   Aspirin Nausea And Vomiting   Due to Stomach ulcer   Ibuprofen Other (See Comments)   Due to stomach ulcer   Penicillins Itching, Swelling, Rash   Breakout  Has patient had a PCN reaction causing immediate rash, facial/tongue/throat swelling, SOB or lightheadedness with hypotension: Yes Has patient had a PCN reaction causing severe rash involving mucus membranes or skin necrosis: Yes Has patient had a PCN reaction that required hospitalization: Yes Has patient had a PCN reaction occurring within the last 10 years: No If all of the above answers are "NO", then may proceed with Cephalosporin use.     Pseudoephedrine Itching, Swelling   Tongue swelling   Sulfa Antibiotics Nausea And Vomiting   Acetaminophen Other (See Comments)   Other reaction(s): elevated LFT's   Guaifenesin Nausea And Vomiting   Latex Itching, Rash   Mucinex Fast-max Nausea And Vomiting     Med Rec must be completed prior to using this [facility]***        Durable Medical Equipment   (From admission, onward)           Start     Ordered   07/25/23 0854  For home use only DME Walker rolling  Once       Question Answer Comment  Walker: With 5 Inch Wheels   Patient needs a walker to treat with the following condition Generalized weakness      07/25/23 0853            Discharge Instructions: Please refer to Patient Instructions section of EMR for full details.  Patient was counseled important signs and symptoms that should prompt return to medical care, changes in medications, dietary instructions, activity restrictions, and follow up appointments.   Follow-Up Appointments:  Follow-up Information     [facility] Follow up.   Specialty: Home Health Services Why: [facility] will provide [facility] physical therapy.  They will call you in the next 24-48 hours to set up services. Contact information: [address] [place] [address] [phone]         Alicia Amel, MD. Go in 4 day(s).   Specialty: Family Medicine Why: Appoinment is 9:50 AM. Please arrive  15 minutes prior to your appointment and bring all of your medications Contact information: [address] [place] [address] [phone]         [facility] Follow up.   Why: [employer] will deliver a RW to your hospital room before you go home. Contact information: [address] [place] [address] [phone]                Penne Lash, MD 07/25/2023, 11:54 AM PGY-1, [facility] Family Medicine

## 2023-07-25 NOTE — Progress Notes (Addendum)
 Daily Progress Note Intern Pager: [phone]  Patient name: [name] Medical record number: [id number] Date of birth: [birth year] Age: 48 y.o. 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  Patient remained on 1L overnight. Patient appears to be doing well, will continue to wean oxygen as tolerated. Assessment & Plan Streptococcal pneumonia (HCC) - wean O2 as tolerated, will trial RA  - Ambulate with pulse ox today - given patient is increased risk for infection, will continue treatment with PO levofloxacin (2/24 - 3/2) - continue home dulera, will add back equivalent of home spiriva and change duonebs to as needed  - Continue home Singulair 10 mg nightly - follow up Bcx   Anemia Hg 8.6 on admission, stable from yesterday at 8.4. Etiology remains unclear. Will need OP evaluation. - continue iron supplementation  - consider outpatient evaluation   Dysphagia Patient complaining of dysphagia, with significant bouts of coughing with taking pills with liquids.  MBS showed patient did have some coughing with certain foods but protected airway throughout.  - SLP signed off   Chronic and Stable Issues: Liver transplant recipient (2015): Continue cyclosporine 100 mg twice daily for now Asthma with allergic rhinitis: Continue home asthma regimen as above.  Schizophrenia, paranoid type: Continue Risperdal 1.5 mg every morning and 3 mg every night and benztropine 1 mg BID  Seizure disorder: Continue Depakote ER 1 g daily.  Hypothyroidism: Continue  Synthroid 75 mcg daily. GERD/LPR: continue BID dosing of famotidine and protonix  FEN/GI: Heart healthy PPx: Lovenox Dispo: Home with HHPT  Subjective:  Reports she is feeling tired this AM. Feels like its harder to breath without oxygen   Objective: Temp:  [98.4 F (36.9 C)-99.5 F (37.5 C)] 99.5 F (37.5 C) (02/27 0400) Pulse Rate:  [85-119] 96 (02/27 0400) Resp:  [17-19] 17 (02/27 0400) BP: (130-154)/(80-97) 150/87 (02/27 0400) SpO2:  [89 %-100 %] 97 % (02/27 0400) Physical Exam: General: chronically ill appearing, NAD Cardiovascular: RRR Respiratory: mild increased WOB on RA, lungs without wheezing, mild rhonchi at L base Abdomen: soft, NTND Extremities: no lower extremity edema noted   Laboratory: Most recent CBC Lab Results  Component Value Date   WBC 5.3 07/24/2023   HGB 8.4 (L) 07/24/2023   HCT 26.1 (L) 07/24/2023   MCV 87.9 07/24/2023   PLT 246 07/24/2023   Most recent BMP    Latest Ref Rng & Units 07/24/2023    7:02 AM  BMP  Glucose 70 - 99 mg/dL 130   BUN 6 - 20 mg/dL 37   Creatinine 8.65 - 1.00 mg/dL 7.84   Sodium 696 - 295 mmol/L 143   Potassium 3.5 - 5.1 mmol/L 5.1   Chloride 98 - 111 mmol/L 109   CO2 22 - 32 mmol/L 25   Calcium 8.9 - 10.3 mg/dL 8.7     Imaging/Diagnostic Tests: MBS 2/26:  Pt presents with an overall functional oropharyngeal swallow. There is flash penetration with thin liquids (PAS 2), which is considered WFL. When pt was swallowing the barium tablet with thin liquids, she appeared to gag and then expectorated the  pill while swallowing the thin liquid bolus. Even in this situation, she protected her airway adequately. She has mild vallecular residue that clears with an independently initiated subswallow. Recommend she continue her regular diet with thin liquids. Provided education regarding taking meds with purees. No SLP f/u is needed, will s/o.   Georg Ruddle Favour Aleshire, MD 07/25/2023, 7:19 AM  PGY-1, [facility] FPTS  Intern pager: [phone], text pages welcome Secure chat group [facility] Teaching Service

## 2023-07-25 NOTE — Discharge Instructions (Signed)
 Dear Allison Quarry,  Thank you for letting us participate in your care. You were hospitalized for cough and shortness of breath and diagnosed with Streptococcal pneumonia (HCC). You were treated with antibiotics and oxygen therapy. We were able to get you feeling better and you were discharged home with some antibiotics. We also noted you had some coughing with food, so we got a study to see if you were choking on your food, and you were not.    POST-HOSPITAL & CARE INSTRUCTIONS Please follow up with your PCP Please ensure you reschedule your work up with OBGYN regarding your abnormal uterine bleeding Go to your follow up appointments (listed below)  DOCTOR'S APPOINTMENT   Future Appointments  Date Time Provider Department Center  09/05/2023  2:15 PM Helane Gunther, DPM [facility]  10/01/2023  3:00 PM Kozlow, Alvira Philips, MD [facility] None    Follow-up Information     [facility] Follow up.   Specialty: Home Health Services Why: [facility] will provide [facility] physical therapy.  They will call you in the next 24-48 hours to set up services. Contact information: [address] [place] [address] [phone]                 Take care and be well!  Family Medicine Teaching Service Inpatient Team [place]  [facility]  [address] [place], [place] [address] [phone]

## 2023-07-25 NOTE — Progress Notes (Signed)
 RN also performed Walking O2 test on pt, pt  weaned  to room air and stayed 92% and above while sitting.RN walked pt on room air tried to walk her on room air, pt only dropped to 90% but pt was only able to do 1 circle and pt asked RN to go back to the room because she was feeling SOB. when pt back to the room pt went into a long coughing spell and dropped to 87-90% on room air and wouldn't come up so RN finally placed her back on 1L [place], after the coughing spell pt sating at 96%, RN notified Dr. Penne Lash MD

## 2023-07-25 NOTE — Plan of Care (Signed)

## 2023-07-25 NOTE — Assessment & Plan Note (Signed)
 Hg 8.6 on admission, stable from yesterday at 8.4. Etiology remains unclear. Will need OP evaluation. - continue iron supplementation  - consider outpatient evaluation

## 2023-07-25 NOTE — Assessment & Plan Note (Signed)
 Patient complaining of dysphagia, with significant bouts of coughing with taking pills with liquids.  MBS showed patient did have some coughing with certain foods but protected airway throughout.  - SLP signed off

## 2023-07-25 NOTE — Plan of Care (Signed)
  Problem: Education: Goal: Knowledge of General Education information will improve Description: Including pain rating scale, medication(s)/side effects and non-pharmacologic comfort measures Outcome: Progressing   Problem: Health Behavior/Discharge Planning: Goal: Ability to manage health-related needs will improve Outcome: Progressing   Problem: Activity: Goal: Risk for activity intolerance will decrease Outcome: Progressing   Problem: Nutrition: Goal: Adequate nutrition will be maintained Outcome: Progressing   Problem: Elimination: Goal: Will not experience complications related to bowel motility Outcome: Progressing Goal: Will not experience complications related to urinary retention Outcome: Progressing   Problem: Pain Managment: Goal: General experience of comfort will improve and/or be controlled Outcome: Progressing   Problem: Safety: Goal: Ability to remain free from injury will improve Outcome: Progressing   Problem: Skin Integrity: Goal: Risk for impaired skin integrity will decrease Outcome: Progressing

## 2023-07-25 NOTE — Assessment & Plan Note (Deleted)
 K 5.2 >> 5.4. now s/p Lokelma.  *** - continue AM BMP

## 2023-07-25 NOTE — Assessment & Plan Note (Addendum)
-   wean O2 as tolerated, will trial RA  - Ambulate with pulse ox today - given patient is increased risk for infection, will continue treatment with PO levofloxacin (2/24 - 3/2) - continue home dulera, will add back equivalent of home spiriva and change duonebs to as needed  - Continue home Singulair 10 mg nightly - follow up Bcx

## 2023-07-26 ENCOUNTER — Other Ambulatory Visit (HOSPITAL_COMMUNITY): Payer: Self-pay

## 2023-07-26 DIAGNOSIS — J189 Pneumonia, unspecified organism: Secondary | ICD-10-CM

## 2023-07-26 DIAGNOSIS — J154 Pneumonia due to other streptococci: Secondary | ICD-10-CM | POA: Diagnosis not present

## 2023-07-26 DIAGNOSIS — J9601 Acute respiratory failure with hypoxia: Secondary | ICD-10-CM | POA: Diagnosis not present

## 2023-07-26 DIAGNOSIS — J4541 Moderate persistent asthma with (acute) exacerbation: Secondary | ICD-10-CM | POA: Diagnosis not present

## 2023-07-26 HISTORY — DX: Pneumonia, unspecified organism: J18.9

## 2023-07-26 MED ORDER — LEVOFLOXACIN 750 MG PO TABS
750.0000 mg | ORAL_TABLET | Freq: Every day | ORAL | 0 refills | Status: AC
Start: 2023-07-26 — End: 2023-07-29
  Filled 2023-07-26: qty 3, 3d supply, fill #0

## 2023-07-26 NOTE — Progress Notes (Signed)
     Daily Progress Note Intern Pager: [phone]  Patient name: [name] Medical record number: [id number] Date of birth: [birth year] Age: 48 y.o. Gender: female  Primary Care Provider: [name], DO Consultants: SLP Code Status: Full  Pt Overview and Major Events to Date:  2/24: admitted 2/26: MBS showed low risk of aspiration   Assessment and Plan: Patient is a 48 year old female with past medical history of primary biliary cholangitis status post liver transplant (2015) now on chronic immunosuppressive therapy with cyclosporine, asthma, schizophrenia, CKD 3, intellectual disability admitted in the setting of strep pneumonia.  Attempted to wean patient off of oxygen yesterday, though patient requested to stay on for personal comfort, though did not have consistent desaturations.  Will plan to ambulate patient off of oxygen today. Assessment & Plan Streptococcal pneumonia (HCC) - wean O2 as tolerated, will trial RA  - Ambulate with pulse ox today without supplemental oxygen - continue treatment with PO levofloxacin (2/24 - 3/2) - continue home dulera and spiriva with duonebs prn - Continue home Singulair 10 mg nightly - follow up Bcx - NGTD   Chronic and Stable Issues: Anemia: patient getting outpatient follow up. Will continue iron supplementation  Liver transplant recipient (2015): Continue cyclosporine 100 mg twice daily for now Asthma with allergic rhinitis: Continue home asthma regimen as above.  Schizophrenia, paranoid type: Continue Risperdal 1.5 mg every morning and 3 mg every night and benztropine 1 mg BID  Seizure disorder: Continue Depakote ER 1 g daily.  Hypothyroidism: Continue Synthroid 75 mcg daily. GERD/LPR: continue BID dosing of famotidine and protonix   FEN/GI: Heart healthy PPx: Lovenox Dispo: Home with home health PT likely today  Subjective:  Patient sitting in chair off oxygen, says she feels much better   Objective: Temp:  [98.3 F  (36.8 C)-99.4 F (37.4 C)] 99.4 F (37.4 C) (02/28 0447) Pulse Rate:  [80-93] 80 (02/28 0447) Resp:  [18-19] 18 (02/28 0447) BP: (118-150)/(85-100) 118/85 (02/28 0447) SpO2:  [92 %-100 %] 92 % (02/28 0754) Physical Exam: General: sitting in chair eating breakfast, NAD  Cardiovascular: mildly tachycardic, no m/r/g Respiratory: NWOB on RA, mild rhonchi at L base Abdomen: soft, NTND  Extremities: no LE edema noted   Laboratory: Most recent CBC Lab Results  Component Value Date   WBC 4.4 07/25/2023   HGB 8.6 (L) 07/25/2023   HCT 26.6 (L) 07/25/2023   MCV 87.5 07/25/2023   PLT 256 07/25/2023   Most recent BMP    Latest Ref Rng & Units 07/25/2023    6:51 AM  BMP  Glucose 70 - 99 mg/dL 213   BUN 6 - 20 mg/dL 22   Creatinine 0.86 - 1.00 mg/dL 5.78   Sodium 469 - 629 mmol/L 139   Potassium 3.5 - 5.1 mmol/L 4.7   Chloride 98 - 111 mmol/L 102   CO2 22 - 32 mmol/L 23   Calcium 8.9 - 10.3 mg/dL 8.7    Blood culture: No growth 4 days  Imaging/Diagnostic Tests: No new imaging  Penne Lash, MD 07/26/2023, 7:56 AM  PGY-1, [facility] FPTS Intern pager: [phone], text pages welcome Secure chat group [facility] Teaching Service

## 2023-07-26 NOTE — Progress Notes (Addendum)
 Walking O2 test   Patient Saturations on Room Air at Rest = 93%  Patient Saturations on Room Air while Ambulating = 97%

## 2023-07-26 NOTE — Assessment & Plan Note (Signed)
-   wean O2 as tolerated, will trial RA  - Ambulate with pulse ox today without supplemental oxygen - continue treatment with PO levofloxacin (2/24 - 3/2) - continue home dulera and spiriva with duonebs prn - Continue home Singulair 10 mg nightly - follow up Bcx - NGTD

## 2023-07-26 NOTE — TOC Progression Note (Signed)
 Transition of Care [facility]) - Progression Note    Patient Details  Name: [name] MRN: [id number] Date of Birth: 07-12-1975  Transition of Care [facility]) CM/SW Contact  Janae Bridgeman, RN Phone Number: 07/26/2023, 10:35 AM  Clinical Narrative:    [name] was called and I delivered a youth RW to her hospital room today.  Patient is set up with [facility] for PT.  Patient plans to have uncle provide transportation to home when medically stable for discharge.        Expected Discharge Plan and Services                                               Social Determinants of Health (SDOH) Interventions SDOH Screenings   Food Insecurity: No Food Insecurity (07/23/2023)  Housing: Low Risk  (07/23/2023)  Transportation Needs: No Transportation Needs (07/23/2023)  Utilities: Not At Risk (07/23/2023)  Depression (PHQ2-9): Low Risk  (06/04/2023)  Financial Resource Strain: Low Risk  (02/08/2021)  Stress: No Stress Concern Present (02/08/2021)  Tobacco Use: Low Risk  (07/23/2023)    Readmission Risk Interventions    07/23/2023    3:50 PM  Readmission Risk Prevention Plan  Transportation Screening Complete  PCP or Specialist Appt within 3-5 Days Complete  HRI or Home Care Consult Complete  Social Work Consult for Recovery Care Planning/Counseling Complete  Palliative Care Screening Complete

## 2023-07-26 NOTE — Plan of Care (Signed)

## 2023-07-26 NOTE — Progress Notes (Signed)
 Physical Therapy Treatment Patient Details Name: [name] MRN: [id number] DOB: 02-12-1976 Today's Date: 07/26/2023   History of Present Illness Pt is 48 year old presented to [facility] on  07/22/23 for SOB. Per EMS SpO2 70%. Pt with PNA and diarrhea. Recent fall at home with head strike. Head CT normal. PMH - intellectual disability, liver transplant, schizophrenia, asthma, seizure, arthritis, ckd, fibromyalgia    PT Comments  Pt with good improvement with mobility and activity tolerance. Doing well amb with rolling walker. Pt on RA at 95% at start of session. Amb on RA with SpO2 92%. After sitting back down in chair pt with coughing spell with SpO2 87-89% for 4-5 minutes before returning to 90%.     If plan is discharge home, recommend the following: Assistance with cooking/housework;Assist for transportation   Can travel by private vehicle        Equipment Recommendations  Rolling walker (2 wheels) (youth)    Recommendations for Other Services       Precautions / Restrictions Precautions Precautions: Fall Restrictions Weight Bearing Restrictions Per Provider Order: No     Mobility  Bed Mobility               General bed mobility comments: Pt up in chair    Transfers Overall transfer level: Needs assistance Equipment used: Rolling walker (2 wheels) Transfers: Sit to/from Stand Sit to Stand: Supervision                Ambulation/Gait Ambulation/Gait assistance: Supervision Gait Distance (Feet): 150 Feet Assistive device: Rolling walker (2 wheels) Gait Pattern/deviations: Wide base of support, Step-through pattern, Decreased stride length Gait velocity: decr Gait velocity interpretation: 1.31 - 2.62 ft/sec, indicative of limited community ambulator   General Gait Details: Stable with walker   Stairs             Wheelchair Mobility     Tilt Bed    Modified Rankin (Stroke Patients Only)       Balance Overall balance assessment: Needs  assistance Sitting-balance support: No upper extremity supported, Feet unsupported Sitting balance-Leahy Scale: Good     Standing balance support: During functional activity, No upper extremity supported Standing balance-Leahy Scale: Fair                              Hotel manager: Impaired Factors Affecting Communication: Reduced clarity of speech  Cognition Arousal: Alert Behavior During Therapy: WFL for tasks assessed/performed   PT - Cognitive impairments: History of cognitive impairments                         Following commands: Intact      Cueing    Exercises      General Comments General comments (skin integrity, edema, etc.): SpO2 95% at rest on RA. Amb on RA with SpO2 92%. After sitting back down pt with coughing spell and SpO2 87-89% for 4-5 minutes prior to returning to 90%.      Pertinent Vitals/Pain      Home Living                          Prior Function            PT Goals (current goals can now be found in the care plan section) Acute Rehab PT Goals Patient Stated Goal: return home PT Goal Formulation: With patient  Time For Goal Achievement: 08/02/23 Potential to Achieve Goals: Good Progress towards PT goals: Goals met and updated - see care plan    Frequency    Min 1X/week      PT Plan      Co-evaluation              AM-PAC PT "6 Clicks" Mobility   Outcome Measure  Help needed turning from your back to your side while in a flat bed without using bedrails?: A Little Help needed moving from lying on your back to sitting on the side of a flat bed without using bedrails?: A Little Help needed moving to and from a bed to a chair (including a wheelchair)?: A Little Help needed standing up from a chair using your arms (e.g., wheelchair or bedside chair)?: A Little Help needed to walk in hospital room?: A Little Help needed climbing 3-5 steps with a railing? : A Little 6  Click Score: 18    End of Session   Activity Tolerance: Patient tolerated treatment well Patient left: in chair;with chair alarm set;with call bell/phone within reach   PT Visit Diagnosis: Unsteadiness on feet (R26.81);Other abnormalities of gait and mobility (R26.89);Muscle weakness (generalized) (M62.81)     Time: 9604-5409 PT Time Calculation (min) (ACUTE ONLY): 17 min  Charges:    $Gait Training: 8-22 mins PT General Charges $$ ACUTE PT VISIT: 1 Visit                     [facility] PT Acute Rehabilitation Services Office [phone]    Angelina Ok [facility] 07/26/2023, 9:36 AM

## 2023-07-27 DIAGNOSIS — I10 Essential (primary) hypertension: Secondary | ICD-10-CM | POA: Diagnosis not present

## 2023-07-27 LAB — CULTURE, BLOOD (ROUTINE X 2)
Culture: NO GROWTH
Culture: NO GROWTH

## 2023-07-28 DIAGNOSIS — I10 Essential (primary) hypertension: Secondary | ICD-10-CM | POA: Diagnosis not present

## 2023-07-29 DIAGNOSIS — J690 Pneumonitis due to inhalation of food and vomit: Secondary | ICD-10-CM | POA: Diagnosis not present

## 2023-07-29 DIAGNOSIS — F200 Paranoid schizophrenia: Secondary | ICD-10-CM | POA: Diagnosis not present

## 2023-07-29 DIAGNOSIS — Z9181 History of falling: Secondary | ICD-10-CM | POA: Diagnosis not present

## 2023-07-29 DIAGNOSIS — E66813 Obesity, class 3: Secondary | ICD-10-CM | POA: Diagnosis not present

## 2023-07-29 DIAGNOSIS — E875 Hyperkalemia: Secondary | ICD-10-CM | POA: Diagnosis not present

## 2023-07-29 DIAGNOSIS — Z7951 Long term (current) use of inhaled steroids: Secondary | ICD-10-CM | POA: Diagnosis not present

## 2023-07-29 DIAGNOSIS — M797 Fibromyalgia: Secondary | ICD-10-CM | POA: Diagnosis not present

## 2023-07-29 DIAGNOSIS — Z79899 Other long term (current) drug therapy: Secondary | ICD-10-CM | POA: Diagnosis not present

## 2023-07-29 DIAGNOSIS — K529 Noninfective gastroenteritis and colitis, unspecified: Secondary | ICD-10-CM | POA: Diagnosis not present

## 2023-07-29 DIAGNOSIS — Z944 Liver transplant status: Secondary | ICD-10-CM | POA: Diagnosis not present

## 2023-07-29 DIAGNOSIS — N1832 Chronic kidney disease, stage 3b: Secondary | ICD-10-CM | POA: Diagnosis not present

## 2023-07-29 DIAGNOSIS — J9601 Acute respiratory failure with hypoxia: Secondary | ICD-10-CM | POA: Diagnosis not present

## 2023-07-29 DIAGNOSIS — K219 Gastro-esophageal reflux disease without esophagitis: Secondary | ICD-10-CM | POA: Diagnosis not present

## 2023-07-29 DIAGNOSIS — M1711 Unilateral primary osteoarthritis, right knee: Secondary | ICD-10-CM | POA: Diagnosis not present

## 2023-07-29 DIAGNOSIS — I493 Ventricular premature depolarization: Secondary | ICD-10-CM | POA: Diagnosis not present

## 2023-07-29 DIAGNOSIS — E039 Hypothyroidism, unspecified: Secondary | ICD-10-CM | POA: Diagnosis not present

## 2023-07-29 DIAGNOSIS — G40909 Epilepsy, unspecified, not intractable, without status epilepticus: Secondary | ICD-10-CM | POA: Diagnosis not present

## 2023-07-29 DIAGNOSIS — R1310 Dysphagia, unspecified: Secondary | ICD-10-CM | POA: Diagnosis not present

## 2023-07-29 DIAGNOSIS — I10 Essential (primary) hypertension: Secondary | ICD-10-CM | POA: Diagnosis not present

## 2023-07-29 DIAGNOSIS — D631 Anemia in chronic kidney disease: Secondary | ICD-10-CM | POA: Diagnosis not present

## 2023-07-29 DIAGNOSIS — Z556 Problems related to health literacy: Secondary | ICD-10-CM | POA: Diagnosis not present

## 2023-07-29 DIAGNOSIS — N390 Urinary tract infection, site not specified: Secondary | ICD-10-CM | POA: Diagnosis not present

## 2023-07-29 DIAGNOSIS — J4541 Moderate persistent asthma with (acute) exacerbation: Secondary | ICD-10-CM | POA: Diagnosis not present

## 2023-07-30 ENCOUNTER — Encounter: Payer: Self-pay | Admitting: Pharmacist

## 2023-07-30 ENCOUNTER — Ambulatory Visit: Admitting: Pharmacist

## 2023-07-30 ENCOUNTER — Other Ambulatory Visit: Payer: Self-pay | Admitting: Neurology

## 2023-07-30 ENCOUNTER — Encounter: Payer: Self-pay | Admitting: Student

## 2023-07-30 ENCOUNTER — Ambulatory Visit: Payer: Self-pay | Admitting: Student

## 2023-07-30 ENCOUNTER — Telehealth: Payer: Self-pay

## 2023-07-30 VITALS — BP 112/73 | HR 97 | Wt 228.4 lb

## 2023-07-30 VITALS — BP 112/73 | HR 97 | Ht 59.0 in | Wt 228.0 lb

## 2023-07-30 DIAGNOSIS — D638 Anemia in other chronic diseases classified elsewhere: Secondary | ICD-10-CM

## 2023-07-30 DIAGNOSIS — B370 Candidal stomatitis: Secondary | ICD-10-CM | POA: Diagnosis not present

## 2023-07-30 DIAGNOSIS — F79 Unspecified intellectual disabilities: Secondary | ICD-10-CM

## 2023-07-30 DIAGNOSIS — J9601 Acute respiratory failure with hypoxia: Secondary | ICD-10-CM | POA: Diagnosis not present

## 2023-07-30 DIAGNOSIS — E875 Hyperkalemia: Secondary | ICD-10-CM | POA: Diagnosis not present

## 2023-07-30 DIAGNOSIS — Z789 Other specified health status: Secondary | ICD-10-CM | POA: Diagnosis not present

## 2023-07-30 DIAGNOSIS — G40909 Epilepsy, unspecified, not intractable, without status epilepticus: Secondary | ICD-10-CM

## 2023-07-30 MED ORDER — FLUCONAZOLE 100 MG PO TABS: 100.0000 mg | ORAL_TABLET | Freq: Every day | ORAL | 0 refills | Status: DC

## 2023-07-30 NOTE — Progress Notes (Unsigned)
    SUBJECTIVE:   CHIEF COMPLAINT / HPI:   Hospital Follow Up  Pneumonia Recently admitted for multifocal pneumonia due to Streptococcus spp.  She was treated with a course of Levaquin for total 7 days, her last dose should have been on Sunday 3/2.  She did not require oxygen upon discharge.  AUB  Anemia Patient has been undergoing workup for this with OB/GYN.  She was scheduled for an endometrial biopsy in the OR under anesthesia on 2/25 but this obviously did not happen due to her being hospitalized.  PERTINENT  PMH / PSH: ***  OBJECTIVE:   LMP 12/02/2022   ***  ASSESSMENT/PLAN:   No problem-specific Assessment & Plan notes found for this encounter.     Eliezer Mccoy, MD [facility]

## 2023-07-30 NOTE — Assessment & Plan Note (Signed)
 Intellectual disability history and polypharmacy - Medication Reconciliation - Medication list reviewed and updated.  History of Liver transplant.  Understanding of regimen: good  Understanding of indications: good  Potential of compliance: good with the use of pill box. TWO weeks of most prescription medications filled into pill boxes. All Meds filled through this week.  Next week, patient will ADD her propranolol which she is having refilled.  Patient will return in 1.5 weeks on Thursday for refill of up to 3 weeks supply.     Refills of medications need for next fill include: Divalproex 1000mg  tablets   Propranolol 10mg  Famotidine 20mg  Symbicort inhaler

## 2023-07-30 NOTE — Patient Instructions (Addendum)
 The white rash in your mouth is likely from your steroid inhalers. I am sending in some medication to treat this. Please take one tablet daily for the next 7 days. After you use your Symbicort or Fluticasone inhaler, be sure that you rinse your mouth out to keep this rash from coming back.   I am checking some labs today and will call you if we need to make any changes.  I will have our care coordination team help get you rescheduled with Dr. Berton Lan.  Eliezer Mccoy, MD

## 2023-07-30 NOTE — Telephone Encounter (Signed)
 Gracee from [facility] calling for PT verbal orders as follows:  1 time(s) weekly for 1 week(s), then 2 time(s) weekly for 4 week(s), then 1 time per week for 4 weeks.   Verbal orders given per [facility] protocol  Veronda Prude, RN

## 2023-07-30 NOTE — Patient Instructions (Addendum)
 It was nice to see you today      Medication Changes: Take your Fluconazole once daily    Please pickup refills for the following medications prior to your next visit/refill   Divalproex 1000mg  tablets   Propranolol 10mg  Famotidine 20mg  Symbicort inhaler

## 2023-07-30 NOTE — Progress Notes (Signed)
    S:     No chief complaint on file.   48 y.o. female who presents for medication management due to concern of polypharmacy.  PMH is significant for liver transplant 06/2013 and intellectual disability   Patient was referred and last seen by Primary Care Provider, Dr. Sherrilee Gilles, on 06/04/23.  Recently hospitalized with pneumonia - 2/24-28 She is also being seen by Dr. Marisue Humble today for hospital follow-up.   Patient arrives in good spirits and presents without any assistance. We missed our schedule appointment last week and patient reports she took all of her medications as prescribed.   During this visit we attempted to fill three week supply.   Insurance Coverage: Medicaid   Do you know what each of your medicines is for? yes Do you feel that your medications are working for you? yes O:  Physical Exam Vitals reviewed.  Neurological:     Mental Status: She is alert.  Psychiatric:        Mood and Affect: Mood normal.     Review of Systems  Respiratory:  Positive for cough, sputum production and shortness of breath.   All other systems reviewed and are negative.   Vitals:   07/30/23 1035  BP: 112/73  Pulse: 97  SpO2: 99%     A/P: Intellectual disability history and polypharmacy - Medication Reconciliation - Medication list reviewed and updated.  History of Liver transplant.  Understanding of regimen: good  Understanding of indications: good  Potential of compliance: good with the use of pill box. TWO weeks of most prescription medications filled into pill boxes. All Meds filled through this week.  Next week, patient will ADD her propranolol which she is having refilled.  Patient will return in 1.5 weeks on Thursday for refill of up to 3 weeks supply.     Refills of medications need for next fill include: Divalproex 1000mg  tablets   Propranolol 10mg  Famotidine 20mg  Symbicort inhaler  Written patient instructions and updated medication list provided. Total time in  face to face counseling 35 minutes.    Follow up Pharmacist 08/08/2023 PCP Clinic Visit TBD Patient seen with Threasa Heads, PharmD Candidate and Mack Guise, PharmD Candidate.

## 2023-07-30 NOTE — Telephone Encounter (Signed)
 Called and spoke with her [name] (on [place]). He states [name] told him yesterday that bottle empty, no refills. Unsure if she was looking at old bottle. Aware we last sent rx 09/06/22 #180, 3 refills. He will double check with her and if refill still needed after, he will call back.   Also scheduled yearly f/u for 08/29/23 at 9am with Dr. Terrace Arabia.

## 2023-07-31 DIAGNOSIS — I10 Essential (primary) hypertension: Secondary | ICD-10-CM | POA: Diagnosis not present

## 2023-07-31 LAB — CBC
Hematocrit: 28.3 % — ABNORMAL LOW (ref 34.0–46.6)
Hemoglobin: 9 g/dL — ABNORMAL LOW (ref 11.1–15.9)
MCH: 28 pg (ref 26.6–33.0)
MCHC: 31.8 g/dL (ref 31.5–35.7)
MCV: 88 fL (ref 79–97)
Platelets: 269 10*3/uL (ref 150–450)
RBC: 3.22 x10E6/uL — ABNORMAL LOW (ref 3.77–5.28)
RDW: 13.4 % (ref 11.7–15.4)
WBC: 4.8 10*3/uL (ref 3.4–10.8)

## 2023-07-31 LAB — BASIC METABOLIC PANEL
BUN/Creatinine Ratio: 13 (ref 9–23)
BUN: 28 mg/dL — ABNORMAL HIGH (ref 6–24)
CO2: 21 mmol/L (ref 20–29)
Calcium: 8.7 mg/dL (ref 8.7–10.2)
Chloride: 104 mmol/L (ref 96–106)
Creatinine, Ser: 2.17 mg/dL — ABNORMAL HIGH (ref 0.57–1.00)
Glucose: 125 mg/dL — ABNORMAL HIGH (ref 70–99)
Potassium: 4.9 mmol/L (ref 3.5–5.2)
Sodium: 142 mmol/L (ref 134–144)
eGFR: 27 mL/min/{1.73_m2} — ABNORMAL LOW (ref >59)

## 2023-07-31 MED ORDER — NYSTATIN 100000 UNIT/ML MT SUSP: 400000.0000 [IU] | Freq: Four times a day (QID) | OROMUCOSAL | 1 refills | Status: AC

## 2023-07-31 NOTE — Progress Notes (Signed)
 Reviewed and agree with Dr Macky Lower plan.

## 2023-07-31 NOTE — Assessment & Plan Note (Signed)
 Noted in hospital. - Repeat BMP today

## 2023-07-31 NOTE — Assessment & Plan Note (Signed)
 Seems to be resolved. Completed 7 days of Levofloxacin. Lingering cough is expected. Lung exam is reassuring and she feels well. She has completed her antibiotic course. Satting 99% ORA. - No further workup or intervention at this time - Has routine follow-up with Asthma and Allergy given her underlying Asthma  - Continue home inhaler regimen, advised that she can stop the Flovent for now and keep it on hand to use as augmentation for future flares as prescribed by her Asthma specialist Dr. Lucie Leather

## 2023-07-31 NOTE — Assessment & Plan Note (Signed)
 Unfortunately did not have her EMB under anesthesia due to hospitalization. Given underlying intellectual disability, will ask [facility] team to assist in getting this rescheduled.  - CBC today  - [facility]I referral

## 2023-08-01 ENCOUNTER — Telehealth: Payer: Self-pay

## 2023-08-01 DIAGNOSIS — I10 Essential (primary) hypertension: Secondary | ICD-10-CM | POA: Diagnosis not present

## 2023-08-01 NOTE — Progress Notes (Signed)
 Complex Care Management Note  Care Guide Note 08/01/2023 Name: [name] MRN: [id number] DOB: 1975/09/02  [name] is a 48 y.o. year old female who sees [name], DO for primary care. I reached out to [employer] by phone today to offer complex care management services.  Ms. Roldan was given information about Complex Care Management services today including:   The Complex Care Management services include support from the care team which includes your Nurse Care Manager, Clinical Social Worker, or Pharmacist.  The Complex Care Management team is here to help remove barriers to the health concerns and goals most important to you. Complex Care Management services are voluntary, and the patient may decline or stop services at any time by request to their care team member.   Complex Care Management Consent Status: Patient wishes to consider information provided and/or speak with a member of the care team before deciding to participate in complex care management services.   Follow up plan:  Telephone appointment with complex care management team member scheduled for:  08/12/23 at 2:30 p.m.   Encounter Outcome:  Patient Scheduled  [facility]  [facility] Health Care Management Assistant Direct Dial: [phone]  Fax: [phone]

## 2023-08-02 ENCOUNTER — Emergency Department (HOSPITAL_COMMUNITY)
Admission: EM | Admit: 2023-08-02 | Discharge: 2023-08-03 | Disposition: A | Discharge status: Home / Self Care | Attending: Emergency Medicine | Admitting: Emergency Medicine

## 2023-08-02 ENCOUNTER — Other Ambulatory Visit: Payer: Self-pay

## 2023-08-02 ENCOUNTER — Ambulatory Visit: Payer: Self-pay | Admitting: Student

## 2023-08-02 ENCOUNTER — Encounter: Payer: Self-pay | Admitting: Student

## 2023-08-02 ENCOUNTER — Encounter (HOSPITAL_COMMUNITY): Payer: Self-pay

## 2023-08-02 VITALS — BP 137/45 | HR 116 | Ht 59.0 in | Wt 229.0 lb

## 2023-08-02 DIAGNOSIS — J189 Pneumonia, unspecified organism: Secondary | ICD-10-CM | POA: Diagnosis not present

## 2023-08-02 DIAGNOSIS — N179 Acute kidney failure, unspecified: Secondary | ICD-10-CM

## 2023-08-02 DIAGNOSIS — R799 Abnormal finding of blood chemistry, unspecified: Secondary | ICD-10-CM | POA: Diagnosis not present

## 2023-08-02 DIAGNOSIS — B370 Candidal stomatitis: Secondary | ICD-10-CM | POA: Diagnosis not present

## 2023-08-02 DIAGNOSIS — N189 Chronic kidney disease, unspecified: Secondary | ICD-10-CM | POA: Diagnosis not present

## 2023-08-02 DIAGNOSIS — R000 Tachycardia, unspecified: Secondary | ICD-10-CM | POA: Diagnosis not present

## 2023-08-02 DIAGNOSIS — Z91040 Latex allergy status: Secondary | ICD-10-CM | POA: Insufficient documentation

## 2023-08-02 DIAGNOSIS — D72819 Decreased white blood cell count, unspecified: Secondary | ICD-10-CM | POA: Diagnosis not present

## 2023-08-02 DIAGNOSIS — I10 Essential (primary) hypertension: Secondary | ICD-10-CM | POA: Diagnosis not present

## 2023-08-02 DIAGNOSIS — R899 Unspecified abnormal finding in specimens from other organs, systems and tissues: Secondary | ICD-10-CM | POA: Insufficient documentation

## 2023-08-02 DIAGNOSIS — R059 Cough, unspecified: Secondary | ICD-10-CM | POA: Diagnosis not present

## 2023-08-02 HISTORY — DX: Candidal stomatitis: B37.0

## 2023-08-02 LAB — CBC WITH DIFFERENTIAL/PLATELET
Abs Immature Granulocytes: 0.03 10*3/uL (ref 0.00–0.07)
Basophils Absolute: 0 10*3/uL (ref 0.0–0.1)
Basophils Relative: 1 %
Eosinophils Absolute: 0.1 10*3/uL (ref 0.0–0.5)
Eosinophils Relative: 2 %
HCT: 30 % — ABNORMAL LOW (ref 36.0–46.0)
Hemoglobin: 9.1 g/dL — ABNORMAL LOW (ref 12.0–15.0)
Immature Granulocytes: 1 %
Lymphocytes Relative: 36 %
Lymphs Abs: 1.4 10*3/uL (ref 0.7–4.0)
MCH: 28.2 pg (ref 26.0–34.0)
MCHC: 30.3 g/dL (ref 30.0–36.0)
MCV: 92.9 fL (ref 80.0–100.0)
Monocytes Absolute: 0.5 10*3/uL (ref 0.1–1.0)
Monocytes Relative: 13 %
Neutro Abs: 1.8 10*3/uL (ref 1.7–7.7)
Neutrophils Relative %: 47 %
Platelets: 209 10*3/uL (ref 150–400)
RBC: 3.23 MIL/uL — ABNORMAL LOW (ref 3.87–5.11)
RDW: 14 % (ref 11.5–15.5)
WBC: 3.8 10*3/uL — ABNORMAL LOW (ref 4.0–10.5)
nRBC: 0 % (ref 0.0–0.2)

## 2023-08-02 LAB — COMPREHENSIVE METABOLIC PANEL
ALT: 16 U/L (ref 0–44)
AST: 18 U/L (ref 15–41)
Albumin: 3.2 g/dL — ABNORMAL LOW (ref 3.5–5.0)
Alkaline Phosphatase: 47 U/L (ref 38–126)
Anion gap: 10 (ref 5–15)
BUN: 18 mg/dL (ref 6–20)
CO2: 23 mmol/L (ref 22–32)
Calcium: 8.6 mg/dL — ABNORMAL LOW (ref 8.9–10.3)
Chloride: 107 mmol/L (ref 98–111)
Creatinine, Ser: 1 mg/dL (ref 0.44–1.00)
GFR, Estimated: >60 mL/min (ref >60)
Glucose, Bld: 128 mg/dL — ABNORMAL HIGH (ref 70–99)
Potassium: 4.9 mmol/L (ref 3.5–5.1)
Sodium: 140 mmol/L (ref 135–145)
Total Bilirubin: 0.6 mg/dL (ref 0.0–1.2)
Total Protein: 7.9 g/dL (ref 6.5–8.1)

## 2023-08-02 NOTE — Patient Instructions (Signed)
 It was great to see you! Thank you for allowing me to participate in your care!   Our plans for today:  -Because I am worried about your kidney function which needs to be worked up quickly, I am advising that you go to the emergency department.  We will arrange transportation   Take care and seek immediate care sooner if you develop any concerns.   Dr. [name], DO [facility]

## 2023-08-02 NOTE — ED Triage Notes (Signed)
 Pt Bib [facility] from her doctors office with an elevated bun and creatinine. Provider is concerned about AKI. Pt is having decreased urinary output.

## 2023-08-02 NOTE — Assessment & Plan Note (Signed)
 In setting of inhaled steroid use no trouble swallowing which is reassuring.  Patient planned to start oral nystatin today. -Can start after ED visit or during hospitalization if required

## 2023-08-02 NOTE — Progress Notes (Signed)
    SUBJECTIVE:   CHIEF COMPLAINT / HPI:   Elevated creatinine After recent hospitalization, creatinine noted to be 2.17 at recent hospital follow-up 07/30/2023. Did recently complete course of Levaquin for pneumonia. Denies any dysuria.  Stated she noticed her urine was red a few days prior to her hospitalization but not since.  States during the hospitalization she started having problems emptying her bladder fully and would urinate and have to go back and finish urinating shortly thereafter which has continued.  Thrush Diagnosed on 3/25 and given Rx oral nystatin.  Has not started it yet but plans to start it today.  Her mouth is not bothering her, no difficulty swallowing    PERTINENT  PMH / PSH: S/p liver transplant, schizophrenia, intellectual disability, seizures, asthma  OBJECTIVE:   BP (!) 137/45   Pulse (!) 116   Ht 4\' 11"  (1.499 m)   Wt 229 lb (103.9 kg)   LMP 12/02/2022   SpO2 99%   BMI 46.25 kg/m    General: NAD, pleasant, able to participate in exam Mouth: Small white patches on tongue and on mucosa of bilateral cheeks Cardiac: Tachycardic, no murmur Respiratory: CTAB, normal effort, No wheezes, rales or rhonchi Abdomen: Soft, nondistended, nontender Extremities: no edema BLEs Skin: warm and dry  ASSESSMENT/PLAN:   AKI (acute kidney injury) (HCC) Cr 2.17 3 days ago, BL ~1.  Today she describes difficulty voiding and possible gross hematuria a couple weeks ago which has not reoccurred.  Does have hx postmenopausal bleeding so cannot r/o vaginal blood in urine. D/t issues with transportation, intellectual disability, and tachycardia, this should be worked up in the ED today versus outpatient setting as I am worried about urinary retention/obstruction causing renal failure. -EMS called for transport to ED -Called pts uncle per her request to let him know she is going to the ED -Depending on ED course/possible hospitalization, will consider urology referral  outpatient if needed for cystoscopy  Thrush In setting of inhaled steroid use no trouble swallowing which is reassuring.  Patient planned to start oral nystatin today. -Can start after ED visit or during hospitalization if required     Dr. [name], DO [facility]

## 2023-08-02 NOTE — ED Provider Notes (Signed)
  EMERGENCY DEPARTMENT AT [facility] Provider Note   CSN: [id number] Arrival date & time: 08/02/23  1635     History  Chief Complaint  Patient presents with   abnormal labs     [place]ac[place] Brailee Riede is a 48 y.o. female.  With a history of iron deficiency anemia, status post liver transplant, CKD and intellectual disability presents to the ED given concern for AKI.  Patient had outpatient labs drawn by her PCP 3 days ago which were concerning for potential AKI.  She is also reported recently decreased urinary frequency.  No dysuria fevers chills weakness vomiting diarrhea chest pain or shortness of breath.  She was directed here for further evaluation by her primary care office  HPI     Home Medications Prior to Admission medications   Medication Sig Start Date End Date Taking? Authorizing Provider  albuterol (PROVENTIL) (2.5 MG/3ML) 0.083% nebulizer solution Take 3 mLs (2.5 mg total) by nebulization every 4 (four) hours as needed for wheezing or shortness of breath. 1 vial via nebulizer every 4-6 hours as needed. 04/02/23   Kozlow, Alvira Philips, MD  ammonium lactate (AMLACTIN) 12 % cream Apply 1 Application topically 2 (two) times daily.    [provider]  azelastine (ASTELIN) 0.1 % nasal spray Place 2 sprays into both nostrils 2 (two) times daily. 04/02/23   Kozlow, Alvira Philips, MD  benztropine (COGENTIN) 1 MG tablet Take 1 tablet (1 mg total) by mouth 2 (two) times daily. 01/31/23   McDiarmid, Leighton Roach, MD  budesonide (PULMICORT) 0.5 MG/2ML nebulizer solution Take 2 mLs (0.5 mg total) by nebulization every 4 (four) hours as needed. Patient taking differently: Take 0.5 mg by nebulization every 4 (four) hours as needed (Asthma). 08/14/22   Kozlow, Alvira Philips, MD  budesonide-formoterol (SYMBICORT) 160-4.5 MCG/ACT inhaler Inhale 2 puffs into the lungs 2 (two) times daily. Use with spacer and mask. 04/02/23   Kozlow, Alvira Philips, MD  carboxymethylcellulose (REFRESH PLUS) 0.5 % SOLN Place 1  drop into both eyes 4 (four) times daily as needed (for dry eyes).    [provider]  cetirizine (ZYRTEC) 10 MG tablet Take 1 tablet (10 mg total) by mouth daily as needed for allergies (Can use an extra dose during flares). Patient taking differently: Take 10 mg by mouth once a week. 04/02/23   Kozlow, Alvira Philips, MD  cycloSPORINE modified 100 MG CAPS Take 100 mg by mouth 2 (two) times daily.    [provider]  divalproex (DEPAKOTE ER) 500 MG 24 hr tablet TAKE 2 TABLETS(1000 MG) BY MOUTH AT BEDTIME 09/06/22   Levert Feinstein, MD  doxepin (SINEQUAN) 25 MG capsule Take 1 capsule (25 mg total) by mouth at bedtime. 04/04/23   McDiarmid, Leighton Roach, MD  famotidine (PEPCID) 20 MG tablet Take 1 tablet (20 mg total) by mouth 2 (two) times daily. 04/02/23   Kozlow, Alvira Philips, MD  ferrous sulfate 325 (65 FE) MG EC tablet Take 1 tablet by mouth daily. 03/02/20   [provider]  fluticasone (FLONASE) 50 MCG/ACT nasal spray Place 1 spray into both nostrils in the morning and at bedtime. 04/02/23   Kozlow, Alvira Philips, MD  fluticasone (FLOVENT HFA) 110 MCG/ACT inhaler Inhale 2 puffs into the lungs 2 (two) times daily. Only during asthma flare ups 04/02/23   Kozlow, Alvira Philips, MD  glucose blood (ACCU-CHEK GUIDE) test strip USE TO CHECK BLOOD SUGAR EVERY DAY 08/09/20   Allayne Stack, DO  levothyroxine (SYNTHROID)  75 MCG tablet TAKE 1 TABLET(75 MCG) BY MOUTH EVERY MORNING 30 MINUTES BEFORE FOOD 04/05/23   [name], DO  lidocaine (HM LIDOCAINE PATCH) 4 % Place 1 patch onto the skin daily. 09/17/22   [name], DO  mometasone (ELOCON) 0.1 % cream Apply topically 2 (two) times daily as needed. Can use on skin one to two times daily if needed. 04/02/23   Kozlow, Alvira Philips, MD  montelukast (SINGULAIR) 10 MG tablet Take 1 tablet (10 mg total) by mouth at bedtime. 04/02/23   Kozlow, Alvira Philips, MD  Multiple Vitamin (MULTIVITAMIN) tablet Take 2 tablets by mouth daily. 9 am. 10/12/13   Charm Rings, NP  Nebulizers MISC 1 kit  by Does not apply route as directed. 08/14/22   Kozlow, Alvira Philips, MD  nystatin (MYCOSTATIN) 100000 UNIT/ML suspension Take 4 mLs (400,000 Units total) by mouth 4 (four) times daily for 7 days. Apply 2mL to each cheek 07/31/23 08/07/23  Alicia Amel, MD  omeprazole (PRILOSEC) 40 MG capsule TAKE 1 CAPSULE(40 MG) BY MOUTH TWICE DAILY 05/13/23   Kozlow, Alvira Philips, MD  polyethylene glycol (MIRALAX / GLYCOLAX) packet Take 17 g by mouth daily as needed (for constipation). In water or juice    [provider]  prazosin (MINIPRESS) 2 MG capsule Take 1 capsule (2 mg total) by mouth at bedtime. 01/31/23   McDiarmid, Leighton Roach, MD  propranolol (INDERAL) 10 MG tablet Take 1 tablet (10 mg total) by mouth 2 (two) times daily. 03/07/23   McDiarmid, Leighton Roach, MD  RA VITAMIN D-3 50 MCG (2000 UT) CAPS Take 1 capsule (2,000 Units total) by mouth daily. 02/19/23   McDiarmid, Leighton Roach, MD  Respiratory Therapy Supplies (NEBULIZER/TUBING/MOUTHPIECE) KIT 2 each by Does not apply route as directed. 12/22/18   Kozlow, Alvira Philips, MD  risperiDONE (RISPERDAL M-TABS) 3 MG disintegrating tablet Take 3 mg by mouth daily as needed. 07/03/23   [provider]  risperiDONE (RISPERDAL) 3 MG tablet Take 0.5-1 tablets (1.5-3 mg total) by mouth 2 (two) times daily. 1/2 of the 3mg  tablet in AM and one 3mg  tablet in the PM 04/04/23   McDiarmid, Leighton Roach, MD  Spacer/Aero-Hold Chamber Mask MISC Use as directed with inhaler. 08/14/22   Kozlow, Alvira Philips, MD  Tiotropium Bromide Monohydrate (SPIRIVA RESPIMAT) 1.25 MCG/ACT AERS Inhale 2 puffs into the lungs in the morning. 04/02/23   Kozlow, Alvira Philips, MD  VENTOLIN HFA 108 (90 Base) MCG/ACT inhaler Inhale 2 puffs into the lungs every 4 (four) hours as needed for wheezing or shortness of breath. Patient taking differently: Inhale 2 puffs into the lungs every 4 (four) hours. 04/02/23   Kozlow, Alvira Philips, MD  Wheat Dextrin (BENEFIBER DRINK MIX PO) Take 1 Scoop by mouth 3 (three) times a week.    [provider]       Allergies    Tramadol hcl, Aspirin, Ibuprofen, Penicillins, Pseudoephedrine, Sulfa antibiotics, Acetaminophen, Guaifenesin, Latex, and Mucinex fast-max    Review of Systems   Review of Systems  Physical Exam Updated Vital Signs BP (!) 157/85   Pulse (!) 113   Temp 98.2 F (36.8 C) (Oral)   Resp 12   LMP 12/02/2022   SpO2 96%  Physical Exam Vitals and nursing note reviewed.  HENT:     Head: Normocephalic and atraumatic.  Eyes:     Pupils: Pupils are equal, round, and reactive to light.  Cardiovascular:     Rate and Rhythm: Normal rate and regular rhythm.  Pulmonary:     Effort: Pulmonary effort is normal.     Breath sounds: Normal breath sounds.  Abdominal:     Palpations: Abdomen is soft.     Tenderness: There is no abdominal tenderness.  Skin:    General: Skin is warm and dry.  Neurological:     Mental Status: She is alert.  Psychiatric:        Mood and Affect: Mood normal.     ED Results / Procedures / Treatments   Labs (all labs ordered are listed, but only abnormal results are displayed) Labs Reviewed  CBC WITH DIFFERENTIAL/PLATELET - Abnormal; Notable for the following components:      Result Value   WBC 3.8 (*)    RBC 3.23 (*)    Hemoglobin 9.1 (*)    HCT 30.0 (*)    All other components within normal limits  COMPREHENSIVE METABOLIC PANEL - Abnormal; Notable for the following components:   Glucose, Bld 128 (*)    Calcium 8.6 (*)    Albumin 3.2 (*)    All other components within normal limits  URINALYSIS, W/ REFLEX TO CULTURE (INFECTION SUSPECTED) - Abnormal; Notable for the following components:   Bacteria, UA RARE (*)    All other components within normal limits    EKG None  Radiology No results found.  Procedures Procedures    Medications Ordered in ED Medications - No data to display  ED Course/ Medical Decision Making/ A&P Clinical Course as of 08/03/23 0118  Sat Aug 03, 2023  0116 Creatinine of 1.0 and BUN of 18 not  consistent with AKI or significant renal injury.  CBC at patient's baseline.  No evidence of UTI on UA.  Suspect transient bump in her decreased urinary output and AKI on labs from 3 days ago would be due to prerenal cause such as dehydration.  She will follow-up with her primary care doctor for reevaluation [MP]    Clinical Course User Index [MP] Royanne Foots, DO                                 Medical Decision Making 48 year old female with history as above presenting given concern for AKI on labs drawn 3 days ago.  She also reports decreased urinary output.  No fevers dysuria or other complaints at this time.  Benign physical exam.  We will obtain repeat labs including metabolic panel CBC and UA to evaluate for AKI, anemia, leukocytosis and UTI.  Amount and/or Complexity of Data Reviewed Labs: ordered.           Final Clinical Impression(s) / ED Diagnoses Final diagnoses:  Abnormal laboratory test result    Rx / DC Orders ED Discharge Orders     None         Royanne Foots, DO 08/03/23 0118

## 2023-08-02 NOTE — Assessment & Plan Note (Signed)
 Cr 2.17 3 days ago, BL ~1.  Today she describes difficulty voiding and possible gross hematuria a couple weeks ago which has not reoccurred.  Does have hx postmenopausal bleeding so cannot r/o vaginal blood in urine. D/t issues with transportation, intellectual disability, and tachycardia, this should be worked up in the ED today versus outpatient setting as I am worried about urinary retention/obstruction causing renal failure. -EMS called for transport to ED -Called pts uncle per her request to let him know she is going to the ED -Depending on ED course/possible hospitalization, will consider urology referral outpatient if needed for cystoscopy

## 2023-08-03 DIAGNOSIS — I10 Essential (primary) hypertension: Secondary | ICD-10-CM | POA: Diagnosis not present

## 2023-08-03 LAB — URINALYSIS, W/ REFLEX TO CULTURE (INFECTION SUSPECTED)
Bilirubin Urine: NEGATIVE
Glucose, UA: NEGATIVE mg/dL
Hgb urine dipstick: NEGATIVE
Ketones, ur: NEGATIVE mg/dL
Leukocytes,Ua: NEGATIVE
Nitrite: NEGATIVE
Protein, ur: NEGATIVE mg/dL
Specific Gravity, Urine: 1.013 (ref 1.005–1.030)
pH: 5 (ref 5.0–8.0)

## 2023-08-03 NOTE — Discharge Instructions (Signed)
 You were seen in the emergency department for laboratory abnormalities on your blood draw from 3 days ago At that time your kidney function was elevated Today your kidney function is normal You do not have a UTI It is unclear what caused the abnormality but it may be due to dehydration Be sure to keep well-hydrated at home Follow-up with your primary care doctor 1 week for reevaluation Return to the emergency department for difficulty with urination or any other concerns

## 2023-08-04 DIAGNOSIS — I10 Essential (primary) hypertension: Secondary | ICD-10-CM | POA: Diagnosis not present

## 2023-08-05 ENCOUNTER — Other Ambulatory Visit: Payer: Self-pay | Admitting: Neurology

## 2023-08-05 ENCOUNTER — Telehealth: Payer: Self-pay

## 2023-08-05 DIAGNOSIS — F79 Unspecified intellectual disabilities: Secondary | ICD-10-CM

## 2023-08-05 DIAGNOSIS — G40909 Epilepsy, unspecified, not intractable, without status epilepticus: Secondary | ICD-10-CM

## 2023-08-05 DIAGNOSIS — I10 Essential (primary) hypertension: Secondary | ICD-10-CM | POA: Diagnosis not present

## 2023-08-05 NOTE — Telephone Encounter (Signed)
 Patient calls nurse line reporting possible GI bug.   She reports symptoms started around 4am this morning with "watery stools." She reports one episode of vomiting after she took her morning medications.   She reports she does feel the "watery stools" have left up since 4am.   She denies any blood in her stools or in emesis. She denies any fevers or chills. No abdominal pain.  She reports she was in the ED on Friday and reports while in the waiting area there was a patient who was vomiting.  Patient advised to monitor her symptoms and to stay well hydrated throughout the day.   Discussed ED precautions with patient.   Will forward to PCP.

## 2023-08-06 ENCOUNTER — Other Ambulatory Visit: Payer: Self-pay

## 2023-08-06 DIAGNOSIS — J4541 Moderate persistent asthma with (acute) exacerbation: Secondary | ICD-10-CM | POA: Diagnosis not present

## 2023-08-06 DIAGNOSIS — K219 Gastro-esophageal reflux disease without esophagitis: Secondary | ICD-10-CM | POA: Diagnosis not present

## 2023-08-06 DIAGNOSIS — J9601 Acute respiratory failure with hypoxia: Secondary | ICD-10-CM | POA: Diagnosis not present

## 2023-08-06 DIAGNOSIS — M797 Fibromyalgia: Secondary | ICD-10-CM | POA: Diagnosis not present

## 2023-08-06 DIAGNOSIS — Z7951 Long term (current) use of inhaled steroids: Secondary | ICD-10-CM | POA: Diagnosis not present

## 2023-08-06 DIAGNOSIS — E039 Hypothyroidism, unspecified: Secondary | ICD-10-CM | POA: Diagnosis not present

## 2023-08-06 DIAGNOSIS — I10 Essential (primary) hypertension: Secondary | ICD-10-CM | POA: Diagnosis not present

## 2023-08-06 DIAGNOSIS — J690 Pneumonitis due to inhalation of food and vomit: Secondary | ICD-10-CM | POA: Diagnosis not present

## 2023-08-06 DIAGNOSIS — E66813 Obesity, class 3: Secondary | ICD-10-CM | POA: Diagnosis not present

## 2023-08-06 DIAGNOSIS — Z79899 Other long term (current) drug therapy: Secondary | ICD-10-CM | POA: Diagnosis not present

## 2023-08-06 DIAGNOSIS — N390 Urinary tract infection, site not specified: Secondary | ICD-10-CM | POA: Diagnosis not present

## 2023-08-06 DIAGNOSIS — I493 Ventricular premature depolarization: Secondary | ICD-10-CM | POA: Diagnosis not present

## 2023-08-06 DIAGNOSIS — K529 Noninfective gastroenteritis and colitis, unspecified: Secondary | ICD-10-CM | POA: Diagnosis not present

## 2023-08-06 DIAGNOSIS — R1310 Dysphagia, unspecified: Secondary | ICD-10-CM | POA: Diagnosis not present

## 2023-08-06 DIAGNOSIS — F200 Paranoid schizophrenia: Secondary | ICD-10-CM | POA: Diagnosis not present

## 2023-08-06 DIAGNOSIS — Z944 Liver transplant status: Secondary | ICD-10-CM | POA: Diagnosis not present

## 2023-08-06 DIAGNOSIS — G40909 Epilepsy, unspecified, not intractable, without status epilepticus: Secondary | ICD-10-CM | POA: Diagnosis not present

## 2023-08-06 DIAGNOSIS — M1711 Unilateral primary osteoarthritis, right knee: Secondary | ICD-10-CM | POA: Diagnosis not present

## 2023-08-06 DIAGNOSIS — Z9181 History of falling: Secondary | ICD-10-CM | POA: Diagnosis not present

## 2023-08-06 DIAGNOSIS — N1832 Chronic kidney disease, stage 3b: Secondary | ICD-10-CM | POA: Diagnosis not present

## 2023-08-06 DIAGNOSIS — Z556 Problems related to health literacy: Secondary | ICD-10-CM | POA: Diagnosis not present

## 2023-08-06 DIAGNOSIS — D631 Anemia in chronic kidney disease: Secondary | ICD-10-CM | POA: Diagnosis not present

## 2023-08-06 DIAGNOSIS — E875 Hyperkalemia: Secondary | ICD-10-CM | POA: Diagnosis not present

## 2023-08-07 DIAGNOSIS — I10 Essential (primary) hypertension: Secondary | ICD-10-CM | POA: Diagnosis not present

## 2023-08-08 ENCOUNTER — Ambulatory Visit (INDEPENDENT_AMBULATORY_CARE_PROVIDER_SITE_OTHER): Admitting: Pharmacist

## 2023-08-08 ENCOUNTER — Encounter: Payer: Self-pay | Admitting: Pharmacist

## 2023-08-08 VITALS — BP 111/74 | HR 99 | Wt 229.0 lb

## 2023-08-08 DIAGNOSIS — D631 Anemia in chronic kidney disease: Secondary | ICD-10-CM | POA: Diagnosis not present

## 2023-08-08 DIAGNOSIS — N390 Urinary tract infection, site not specified: Secondary | ICD-10-CM | POA: Diagnosis not present

## 2023-08-08 DIAGNOSIS — F79 Unspecified intellectual disabilities: Secondary | ICD-10-CM

## 2023-08-08 DIAGNOSIS — Z7951 Long term (current) use of inhaled steroids: Secondary | ICD-10-CM | POA: Diagnosis not present

## 2023-08-08 DIAGNOSIS — J4541 Moderate persistent asthma with (acute) exacerbation: Secondary | ICD-10-CM | POA: Diagnosis not present

## 2023-08-08 DIAGNOSIS — G40909 Epilepsy, unspecified, not intractable, without status epilepticus: Secondary | ICD-10-CM | POA: Diagnosis not present

## 2023-08-08 DIAGNOSIS — J690 Pneumonitis due to inhalation of food and vomit: Secondary | ICD-10-CM | POA: Diagnosis not present

## 2023-08-08 DIAGNOSIS — I493 Ventricular premature depolarization: Secondary | ICD-10-CM | POA: Diagnosis not present

## 2023-08-08 DIAGNOSIS — N1832 Chronic kidney disease, stage 3b: Secondary | ICD-10-CM | POA: Diagnosis not present

## 2023-08-08 DIAGNOSIS — E875 Hyperkalemia: Secondary | ICD-10-CM | POA: Diagnosis not present

## 2023-08-08 DIAGNOSIS — K219 Gastro-esophageal reflux disease without esophagitis: Secondary | ICD-10-CM | POA: Diagnosis not present

## 2023-08-08 DIAGNOSIS — M797 Fibromyalgia: Secondary | ICD-10-CM | POA: Diagnosis not present

## 2023-08-08 DIAGNOSIS — Z79899 Other long term (current) drug therapy: Secondary | ICD-10-CM | POA: Diagnosis not present

## 2023-08-08 DIAGNOSIS — K529 Noninfective gastroenteritis and colitis, unspecified: Secondary | ICD-10-CM | POA: Diagnosis not present

## 2023-08-08 DIAGNOSIS — M1711 Unilateral primary osteoarthritis, right knee: Secondary | ICD-10-CM | POA: Diagnosis not present

## 2023-08-08 DIAGNOSIS — E039 Hypothyroidism, unspecified: Secondary | ICD-10-CM | POA: Diagnosis not present

## 2023-08-08 DIAGNOSIS — Z556 Problems related to health literacy: Secondary | ICD-10-CM | POA: Diagnosis not present

## 2023-08-08 DIAGNOSIS — F819 Developmental disorder of scholastic skills, unspecified: Secondary | ICD-10-CM | POA: Diagnosis not present

## 2023-08-08 DIAGNOSIS — Z9181 History of falling: Secondary | ICD-10-CM | POA: Diagnosis not present

## 2023-08-08 DIAGNOSIS — I10 Essential (primary) hypertension: Secondary | ICD-10-CM | POA: Diagnosis not present

## 2023-08-08 DIAGNOSIS — Z944 Liver transplant status: Secondary | ICD-10-CM | POA: Diagnosis not present

## 2023-08-08 DIAGNOSIS — R1310 Dysphagia, unspecified: Secondary | ICD-10-CM | POA: Diagnosis not present

## 2023-08-08 DIAGNOSIS — J9601 Acute respiratory failure with hypoxia: Secondary | ICD-10-CM | POA: Diagnosis not present

## 2023-08-08 DIAGNOSIS — E66813 Obesity, class 3: Secondary | ICD-10-CM | POA: Diagnosis not present

## 2023-08-08 DIAGNOSIS — F200 Paranoid schizophrenia: Secondary | ICD-10-CM | POA: Diagnosis not present

## 2023-08-08 MED ORDER — DIVALPROEX SODIUM ER 500 MG PO TB24: 1000.0000 mg | ORAL_TABLET | Freq: Every day | ORAL | 3 refills | Status: DC

## 2023-08-08 MED ORDER — PROPRANOLOL HCL 10 MG PO TABS: 10.0000 mg | ORAL_TABLET | Freq: Two times a day (BID) | ORAL | 11 refills | Status: DC

## 2023-08-08 MED ORDER — FAMOTIDINE 20 MG PO TABS: 20.0000 mg | ORAL_TABLET | Freq: Two times a day (BID) | ORAL | 3 refills | Status: DC

## 2023-08-08 MED ORDER — DOXEPIN HCL 25 MG PO CAPS: 25.0000 mg | ORAL_CAPSULE | Freq: Every day | ORAL | 3 refills | Status: DC

## 2023-08-08 MED ORDER — FERROUS SULFATE 325 (65 FE) MG PO TBEC: 1.0000 | DELAYED_RELEASE_TABLET | Freq: Every day | ORAL | Status: DC

## 2023-08-08 NOTE — Assessment & Plan Note (Signed)
 Intellectual disability history and polypharmacy - Medication Reconciliation - Medication list reviewed and updated.  History of Liver transplant.  Understanding of regimen: good  Understanding of indications: good  Potential of compliance: good with the use of pill box. TWO weeks of most prescription medications filled into pill boxes. All Meds filled except propranolog which she is having refilled.  Patient will return in 2.5 weeks on Thursday for refill of up to 3 weeks supply.     Refills of medications need for next fill include: Divalproex 1000mg  tablets  Doxepin 25mg  Famotidine 20mg   Propranolol 10mg 

## 2023-08-08 NOTE — Patient Instructions (Addendum)
 It was nice to see you today     Medication Changes: None Please pickup refills for the following medications prior to your next visit/refill  Refills of medications need for next fill include: Divalproex 1000mg  tablets  Doxepin 25mg  Famotidine 20mg   Propranolol 10mg   Ferrous Sulfate - OVER THE COUNTER

## 2023-08-08 NOTE — Progress Notes (Signed)
    S:     Chief Complaint  Patient presents with   Medication Management    Diabetes and Med Management Follow Up    47 y.o. who presents for medication management due to concern of polypharmacy.  PMH is significant for liver transplant 06/2013 and intellectual disability   Patient was referred and last seen by Primary Care Provider, Dr. Yetta Barre  on 08/02/2023.  Recently visited ED for concern of elevated creatinine which resolved.    Patient arrives in good spirits and presents with assistance of WALKER.   During this visit we attempted to fill two week supply.   Insurance Coverage: Medicaid   Do you know what each of your medicines is for? yes Do you feel that your medications are working for you? ye   O:  Physical Exam Vitals reviewed.  Constitutional:      Appearance: Normal appearance.  Pulmonary:     Effort: Pulmonary effort is normal.  Neurological:     Mental Status: She is alert. Mental status is at baseline.  Psychiatric:        Mood and Affect: Mood normal.        Thought Content: Thought content normal.    Review of Systems  All other systems reviewed and are negative.   Vitals:   08/08/23 1408  BP: 111/74  Pulse: 99  SpO2: 99%    A/P: Intellectual disability history and polypharmacy - Medication Reconciliation - Medication list reviewed and updated.  History of Liver transplant.  Understanding of regimen: good  Understanding of indications: good  Potential of compliance: good with the use of pill box. TWO weeks of most prescription medications filled into pill boxes. All Meds filled except propranolog which she is having refilled.  Patient will return in 2.5 weeks on Thursday for refill of up to 3 weeks supply.     Refills of medications need for next fill include: Divalproex 1000mg  tablets  Doxepin 25mg  Famotidine 20mg   Propranolol 10mg   Ferrous Sulfate - OVER THE COUNTER   Written patient instructions and updated medication list provided.  Total time in face to face counseling 35 minutes.     Follow up Pharmacist 08/08/2023 PCP Clinic Visit TBD Patient seen with Threasa Heads, PharmD Candidate and Mack Guise, PharmD Candidate

## 2023-08-09 ENCOUNTER — Telehealth: Payer: Self-pay | Admitting: Student

## 2023-08-09 ENCOUNTER — Other Ambulatory Visit: Payer: Self-pay | Admitting: Student

## 2023-08-09 DIAGNOSIS — Z79899 Other long term (current) drug therapy: Secondary | ICD-10-CM

## 2023-08-09 DIAGNOSIS — I10 Essential (primary) hypertension: Secondary | ICD-10-CM | POA: Diagnosis not present

## 2023-08-09 DIAGNOSIS — Z944 Liver transplant status: Secondary | ICD-10-CM

## 2023-08-09 NOTE — Telephone Encounter (Signed)
 Patient called to schedule lab work, stated that the coordinator from [name] should be reaching out to her doctor. Did go ahead and schedule her for 3/20 at 11:45, but am unsure as to what she said she she needed done.

## 2023-08-09 NOTE — Progress Notes (Signed)
 Pt called saying liver transplant team at [facility] it requesting labs to be drawn.  I am assuming they are needing her routine labs including CMP, CBC and cycloporine level which we have ordered in the past and had faxed to them.  CMP and CBC were both recently done on 08/02/23.  I ordered a future cyclosporin level. Once back, the above labs should be faxed to [facility]  If we receive a fax order for different labs, can change order

## 2023-08-09 NOTE — Progress Notes (Signed)
 Reviewed and agree with Dr Macky Lower plan.

## 2023-08-10 DIAGNOSIS — I10 Essential (primary) hypertension: Secondary | ICD-10-CM | POA: Diagnosis not present

## 2023-08-11 DIAGNOSIS — I10 Essential (primary) hypertension: Secondary | ICD-10-CM | POA: Diagnosis not present

## 2023-08-12 ENCOUNTER — Other Ambulatory Visit: Payer: Self-pay | Admitting: *Deleted

## 2023-08-12 DIAGNOSIS — I10 Essential (primary) hypertension: Secondary | ICD-10-CM | POA: Diagnosis not present

## 2023-08-12 NOTE — Patient Instructions (Signed)
 Visit Information  [name] was given information about Medicaid Managed Care team care coordination services as a part of their Healthy [facility] Medicaid benefit. [name] verbally consented to engagement with the [facility] Managed Care team.   If you are experiencing a medical emergency, please call 911 or report to your local emergency department or urgent care.   If you have a non-emergency medical problem during routine business hours, please contact your provider's office and ask to speak with a nurse.   For questions related to your Healthy [facility] health plan, please call: [phone] or visit the homepage here: [email or web address]  If you would like to schedule transportation through your Healthy [facility] plan, please call the following number at least 2 days in advance of your appointment: [phone]  For information about your ride after you set it up, call Ride Assist at [phone]. Use this number to activate a Will Call pickup, or if your transportation is late for a scheduled pickup. Use this number, too, if you need to make a change or cancel a previously scheduled reservation.  If you need transportation services right away, call [phone]. The after-hours call center is staffed 24 hours to handle ride assistance and urgent reservation requests (including discharges) 365 days a year. Urgent trips include sick visits, hospital discharge requests and life-sustaining treatment.  Call the [facility] Line at [phone], at any time, 24 hours a day, 7 days a week. If you are in danger or need immediate medical attention call 911.  If you would like help to quit smoking, call 1-800-QUIT-NOW (305-066-5465) OR Espaol: 1-855-Djelo-Ya (9-323-557-3220) o para ms informacin haga clic aqu or Text READY to 254-270 to register via text  [name],   Please see education materials related to abnormal vaginal  bleeding and weight loss provided by MyChart link.  Patient verbalizes understanding of instructions and care plan provided today and agrees to view in MyChart. Active MyChart status and patient understanding of how to access instructions and care plan via MyChart confirmed with patient.     Telephone follow up appointment with Managed Medicaid care management team member scheduled for:08/22/23 at 12:30pm  Estanislado Emms RN, BSN [place]  Value-Based Care [facility] RN Care Manager [phone]   Following is a copy of your plan of care:  Care Plan : RN Care Manager Plan of Care  Updates made by Heidi Dach, RN since 08/12/2023 12:00 AM     Problem: Health Management needs related to GYN and Weight Loss      Long-Range Goal: Development of Plan of Care to address Health Management needs related to GYN and Weight Loss   Start Date: 08/12/2023  Expected End Date: 11/10/2023  Note:   Current Barriers:  Knowledge Deficits related to plan of care for management of GYN Issue and Weight Loss   RNCM Clinical Goal(s):  Patient will verbalize understanding of plan for management of Abnormal Vaginal Bleeding and Weight Loss as evidenced by Patient reports attend all scheduled medical appointments: 08/15/23 for Lab, 08/29/23 with Neurology, 09/05/23 with TFC, 10/01/23 with Allergy and Asthma and 10/14/23 with [name] as evidenced by Provider documentation in EMR continue to work with RN Care Manager to address care management and care coordination needs related to  Abnormal Vaginal Bleeding and Weight Loss as evidenced by adherence to CM Team Scheduled appointments through collaboration with RN Care manager, provider, and care team.   Interventions: Evaluation of current treatment plan related  to  self management and patient's adherence to plan as established by provider   GYN Issue  (Status:  New goal.)  Short Term Goal Evaluation of current treatment plan related to   Abnormal Vaginal Bleeding ,  self-management and patient's adherence to plan as established by provider. Discussed plans with patient for ongoing care management follow up and provided patient with direct contact information for care management team Collaborated with GYN provider regarding patient needing to reschedule Endometrial biopsy performed in the OR Assessed social determinant of health barriers Provided education regarding abnormal vaginal bleeding, ensured patient has not bleeding at this time   Weight Loss Interventions:  (Status:  New goal.) Long Term Goal Advised patient to discuss with primary care provider options regarding weight management Reviewed recommended dietary changes: avoid fad diets, make small/incremental dietary and exercise changes, eat at the table and avoid eating in front of the TV, plan management of cravings, monitor snacking and cravings in food diary Assessed social determinant of health barriers Provided patient with educational material via MyChart   Advised patient to contact [employer] [phone] for member benefits(weight loss)  Patient Goals/Self-Care Activities: Take all medications as prescribed Attend all scheduled provider appointments Attend church or other social activities Call provider office for new concerns or questions  Reschedule missed appointments  Follow Up Plan:  Telephone follow up appointment with care management team member scheduled for:  08/22/23 at 12:30pm

## 2023-08-12 NOTE — Patient Outreach (Addendum)
 Medicaid Managed Care   Nurse Care Manager Note  08/12/2023 Name:  [name] MRN:  [id number] DOB:  26-Jul-1975  [name] is an 48 y.o. year old female who is a primary patient of [name], DO.  The [facility] Managed Care Coordination team was consulted for assistance with:    Abnormal Vaginal Bleeding Weight Loss  [name] was given information about Medicaid Managed Care Coordination team services today. [name] Patient agreed to services and verbal consent obtained.  Engaged with patient by telephone for initial visit in response to provider referral for case management and/or care coordination services.   Patient is participating in a Managed Medicaid Plan:  Yes  Assessments/Interventions:  Review of past medical history, allergies, medications, health status, including review of consultants reports, laboratory and other test data, was performed as part of comprehensive evaluation and provision of chronic care management services.  SDOH (Social Drivers of Health) assessments and interventions performed: SDOH Interventions    Flowsheet Row Patient Outreach from 08/12/2023 in [place] POPULATION HEALTH DEPARTMENT Documentation from 05/03/2022 in [facility] [place]. [facility] Chronic Care Management from 02/08/2021 in [facility] Of [place]. [facility]  SDOH Interventions     Food Insecurity Interventions Intervention Not Indicated -- --  Housing Interventions Intervention Not Indicated -- --  Transportation Interventions Intervention Not Indicated Taxi Voucher Given --  [uses SCAT and Medicaid Transportation]  Utilities Interventions Intervention Not Indicated -- --       Care Plan  Allergies  Allergen Reactions   Tramadol Hcl Itching and Nausea And Vomiting    Causes SEIZURES   Aspirin Nausea And Vomiting    Due to Stomach ulcer   Ibuprofen Other (See Comments)    Due  to stomach ulcer   Penicillins Itching, Swelling and Rash    Breakout  Has patient had a PCN reaction causing immediate rash, facial/tongue/throat swelling, SOB or lightheadedness with hypotension: Yes Has patient had a PCN reaction causing severe rash involving mucus membranes or skin necrosis: Yes Has patient had a PCN reaction that required hospitalization: Yes Has patient had a PCN reaction occurring within the last 10 years: No If all of the above answers are "NO", then may proceed with Cephalosporin use.      Pseudoephedrine Itching and Swelling    Tongue swelling   Sulfa Antibiotics Nausea And Vomiting   Acetaminophen Other (See Comments)    Other reaction(s): elevated LFT's   Guaifenesin Nausea And Vomiting   Latex Itching and Rash   Mucinex Fast-Max Nausea And Vomiting    Medications Reviewed Today   Medications were not reviewed in this encounter   Patient is followed by Clinical Pharmacist who prepares and organizes medication. Last seen on 08/08/23.  Patient Active Problem List   Diagnosis Date Noted   Thrush 08/02/2023   Multifocal pneumonia 07/26/2023   Dysphagia 07/24/2023   Hypoalbuminemia 07/23/2023   Acute respiratory failure with hypoxia (HCC) 07/23/2023   Moderate persistent asthma with exacerbation 07/23/2023   Streptococcal pneumonia (HCC) 07/22/2023   Intellectual delay 07/22/2023   Hematuria, CKD3 07/22/2023   Fall 07/22/2023   Hyperkalemia 07/22/2023   Diarrhea 07/22/2023   Class 3 severe obesity with body mass index (BMI) of 45.0 to 49.9 in adult [facility]) 04/03/2023   Osteoarthritis of right knee 09/19/2022   Postmenopausal bleeding 06/25/2022   Right knee pain 05/16/2022  AKI (acute kidney injury) (HCC) 05/03/2022   Pap smear for cervical cancer screening 11/27/2021   Impaired instrumental activities of daily living (IADL) 01/26/2021   Onychogryphosis 01/14/2020   Pedal edema 01/14/2020   Subclinical hypothyroidism 03/31/2019   Seasonal  allergies 11/04/2018   Liver transplant recipient [facility]) 09/26/2018   Thrombocytopenia (HCC) 03/09/2013   Seizure disorder (HCC) 03/01/2013   Schizophrenia, paranoid type (HCC) 06/25/2012    Class: Chronic   Menorrhagia 12/22/2010   Anemia 03/03/2010   GERD 07/21/2008   CARDIAC FLOW MURMUR 10/16/2007   Asthma with allergic rhinitis 07/30/2006   Intellectual disability 07/25/2006   OSTEOARTHRITIS, LOWER LEG 07/25/2006    Conditions to be addressed/monitored per PCP order:   Abnormal Vaginal Bleeding and Weight Loss  Care Plan : RN Care Manager Plan of Care  Updates made by Heidi Dach, RN since 08/12/2023 12:00 AM     Problem: Health Management needs related to GYN and Weight Loss      Long-Range Goal: Development of Plan of Care to address Health Management needs related to GYN and Weight Loss   Start Date: 08/12/2023  Expected End Date: 11/10/2023  Note:   Current Barriers:  Knowledge Deficits related to plan of care for management of GYN Issue and Weight Loss   RNCM Clinical Goal(s):  Patient will verbalize understanding of plan for management of Abnormal Vaginal Bleeding and Weight Loss as evidenced by Patient reports attend all scheduled medical appointments: 08/15/23 for Lab, 08/29/23 with Neurology, 09/05/23 with [facility], 10/01/23 with Allergy and Asthma and 10/14/23 with [name] as evidenced by Provider documentation in EMR continue to work with RN Care Manager to address care management and care coordination needs related to  Abnormal Vaginal Bleeding and Weight Loss as evidenced by adherence to CM Team Scheduled appointments through collaboration with RN Care manager, provider, and care team.   Interventions: Evaluation of current treatment plan related to  self management and patient's adherence to plan as established by provider   GYN Issue  (Status:  New goal.)  Short Term Goal Evaluation of current treatment plan related to  Abnormal Vaginal Bleeding ,   self-management and patient's adherence to plan as established by provider. Discussed plans with patient for ongoing care management follow up and provided patient with direct contact information for care management team Collaborated with GYN provider regarding patient needing to reschedule Endometrial biopsy performed in the OR Assessed social determinant of health barriers Provided education regarding abnormal vaginal bleeding, ensured patient has not bleeding at this time   Weight Loss Interventions:  (Status:  New goal.) Long Term Goal Advised patient to discuss with primary care provider options regarding weight management Reviewed recommended dietary changes: avoid fad diets, make small/incremental dietary and exercise changes, eat at the table and avoid eating in front of the TV, plan management of cravings, monitor snacking and cravings in food diary Assessed social determinant of health barriers Provided patient with educational material via MyChart   Advised patient to contact [employer] [phone] for member benefits(weight loss)  Patient Goals/Self-Care Activities: Take all medications as prescribed Attend all scheduled provider appointments Attend church or other social activities Call provider office for new concerns or questions  Reschedule missed appointments  Follow Up Plan:  Telephone follow up appointment with care management team member scheduled for:  08/22/23 at 12:30pm      Follow Up:  Patient agrees to Care Plan and Follow-up.  Plan: The Managed Medicaid care management team  will reach out to the patient again over the next 7 days.  Date/time of next scheduled RN care management/care coordination outreach:  08/22/23 at 12:30pm  Estanislado Emms RN, BSN [place]  Value-Based Care [facility] RN Care Manager [phone]

## 2023-08-13 DIAGNOSIS — I10 Essential (primary) hypertension: Secondary | ICD-10-CM | POA: Diagnosis not present

## 2023-08-13 DIAGNOSIS — I493 Ventricular premature depolarization: Secondary | ICD-10-CM | POA: Diagnosis not present

## 2023-08-13 DIAGNOSIS — M797 Fibromyalgia: Secondary | ICD-10-CM | POA: Diagnosis not present

## 2023-08-13 DIAGNOSIS — J690 Pneumonitis due to inhalation of food and vomit: Secondary | ICD-10-CM | POA: Diagnosis not present

## 2023-08-13 DIAGNOSIS — K219 Gastro-esophageal reflux disease without esophagitis: Secondary | ICD-10-CM | POA: Diagnosis not present

## 2023-08-13 DIAGNOSIS — E039 Hypothyroidism, unspecified: Secondary | ICD-10-CM | POA: Diagnosis not present

## 2023-08-13 DIAGNOSIS — J9601 Acute respiratory failure with hypoxia: Secondary | ICD-10-CM | POA: Diagnosis not present

## 2023-08-13 DIAGNOSIS — K529 Noninfective gastroenteritis and colitis, unspecified: Secondary | ICD-10-CM | POA: Diagnosis not present

## 2023-08-13 DIAGNOSIS — N1832 Chronic kidney disease, stage 3b: Secondary | ICD-10-CM | POA: Diagnosis not present

## 2023-08-13 DIAGNOSIS — Z7951 Long term (current) use of inhaled steroids: Secondary | ICD-10-CM | POA: Diagnosis not present

## 2023-08-13 DIAGNOSIS — Z556 Problems related to health literacy: Secondary | ICD-10-CM | POA: Diagnosis not present

## 2023-08-13 DIAGNOSIS — J4541 Moderate persistent asthma with (acute) exacerbation: Secondary | ICD-10-CM | POA: Diagnosis not present

## 2023-08-13 DIAGNOSIS — Z9181 History of falling: Secondary | ICD-10-CM | POA: Diagnosis not present

## 2023-08-13 DIAGNOSIS — F200 Paranoid schizophrenia: Secondary | ICD-10-CM | POA: Diagnosis not present

## 2023-08-13 DIAGNOSIS — G40909 Epilepsy, unspecified, not intractable, without status epilepticus: Secondary | ICD-10-CM | POA: Diagnosis not present

## 2023-08-13 DIAGNOSIS — Z79899 Other long term (current) drug therapy: Secondary | ICD-10-CM | POA: Diagnosis not present

## 2023-08-13 DIAGNOSIS — E875 Hyperkalemia: Secondary | ICD-10-CM | POA: Diagnosis not present

## 2023-08-13 DIAGNOSIS — N390 Urinary tract infection, site not specified: Secondary | ICD-10-CM | POA: Diagnosis not present

## 2023-08-13 DIAGNOSIS — R1310 Dysphagia, unspecified: Secondary | ICD-10-CM | POA: Diagnosis not present

## 2023-08-13 DIAGNOSIS — M1711 Unilateral primary osteoarthritis, right knee: Secondary | ICD-10-CM | POA: Diagnosis not present

## 2023-08-13 DIAGNOSIS — D631 Anemia in chronic kidney disease: Secondary | ICD-10-CM | POA: Diagnosis not present

## 2023-08-13 DIAGNOSIS — Z944 Liver transplant status: Secondary | ICD-10-CM | POA: Diagnosis not present

## 2023-08-13 DIAGNOSIS — E66813 Obesity, class 3: Secondary | ICD-10-CM | POA: Diagnosis not present

## 2023-08-14 ENCOUNTER — Other Ambulatory Visit: Payer: Self-pay | Admitting: Student

## 2023-08-14 DIAGNOSIS — Z944 Liver transplant status: Secondary | ICD-10-CM

## 2023-08-14 DIAGNOSIS — I10 Essential (primary) hypertension: Secondary | ICD-10-CM | POA: Diagnosis not present

## 2023-08-15 ENCOUNTER — Other Ambulatory Visit

## 2023-08-15 DIAGNOSIS — J690 Pneumonitis due to inhalation of food and vomit: Secondary | ICD-10-CM | POA: Diagnosis not present

## 2023-08-15 DIAGNOSIS — Z79899 Other long term (current) drug therapy: Secondary | ICD-10-CM

## 2023-08-15 DIAGNOSIS — N1832 Chronic kidney disease, stage 3b: Secondary | ICD-10-CM | POA: Diagnosis not present

## 2023-08-15 DIAGNOSIS — Z556 Problems related to health literacy: Secondary | ICD-10-CM | POA: Diagnosis not present

## 2023-08-15 DIAGNOSIS — D631 Anemia in chronic kidney disease: Secondary | ICD-10-CM | POA: Diagnosis not present

## 2023-08-15 DIAGNOSIS — K529 Noninfective gastroenteritis and colitis, unspecified: Secondary | ICD-10-CM | POA: Diagnosis not present

## 2023-08-15 DIAGNOSIS — K219 Gastro-esophageal reflux disease without esophagitis: Secondary | ICD-10-CM | POA: Diagnosis not present

## 2023-08-15 DIAGNOSIS — Z944 Liver transplant status: Secondary | ICD-10-CM | POA: Diagnosis not present

## 2023-08-15 DIAGNOSIS — J9601 Acute respiratory failure with hypoxia: Secondary | ICD-10-CM | POA: Diagnosis not present

## 2023-08-15 DIAGNOSIS — E875 Hyperkalemia: Secondary | ICD-10-CM | POA: Diagnosis not present

## 2023-08-15 DIAGNOSIS — M797 Fibromyalgia: Secondary | ICD-10-CM | POA: Diagnosis not present

## 2023-08-15 DIAGNOSIS — R1310 Dysphagia, unspecified: Secondary | ICD-10-CM | POA: Diagnosis not present

## 2023-08-15 DIAGNOSIS — G40909 Epilepsy, unspecified, not intractable, without status epilepticus: Secondary | ICD-10-CM | POA: Diagnosis not present

## 2023-08-15 DIAGNOSIS — Z9181 History of falling: Secondary | ICD-10-CM | POA: Diagnosis not present

## 2023-08-15 DIAGNOSIS — M1711 Unilateral primary osteoarthritis, right knee: Secondary | ICD-10-CM | POA: Diagnosis not present

## 2023-08-15 DIAGNOSIS — E66813 Obesity, class 3: Secondary | ICD-10-CM | POA: Diagnosis not present

## 2023-08-15 DIAGNOSIS — N390 Urinary tract infection, site not specified: Secondary | ICD-10-CM | POA: Diagnosis not present

## 2023-08-15 DIAGNOSIS — Z7951 Long term (current) use of inhaled steroids: Secondary | ICD-10-CM | POA: Diagnosis not present

## 2023-08-15 DIAGNOSIS — E039 Hypothyroidism, unspecified: Secondary | ICD-10-CM | POA: Diagnosis not present

## 2023-08-15 DIAGNOSIS — F200 Paranoid schizophrenia: Secondary | ICD-10-CM | POA: Diagnosis not present

## 2023-08-15 DIAGNOSIS — J4541 Moderate persistent asthma with (acute) exacerbation: Secondary | ICD-10-CM | POA: Diagnosis not present

## 2023-08-15 DIAGNOSIS — I493 Ventricular premature depolarization: Secondary | ICD-10-CM | POA: Diagnosis not present

## 2023-08-16 DIAGNOSIS — I10 Essential (primary) hypertension: Secondary | ICD-10-CM | POA: Diagnosis not present

## 2023-08-17 DIAGNOSIS — I10 Essential (primary) hypertension: Secondary | ICD-10-CM | POA: Diagnosis not present

## 2023-08-17 LAB — CYCLOSPORINE: Cyclosporine, LC-MS/MS: 380 ng/mL (ref 100–400)

## 2023-08-18 DIAGNOSIS — I10 Essential (primary) hypertension: Secondary | ICD-10-CM | POA: Diagnosis not present

## 2023-08-19 DIAGNOSIS — I10 Essential (primary) hypertension: Secondary | ICD-10-CM | POA: Diagnosis not present

## 2023-08-20 ENCOUNTER — Telehealth: Payer: Self-pay

## 2023-08-20 ENCOUNTER — Telehealth: Payer: Self-pay | Admitting: Student

## 2023-08-20 DIAGNOSIS — G40909 Epilepsy, unspecified, not intractable, without status epilepticus: Secondary | ICD-10-CM | POA: Diagnosis not present

## 2023-08-20 DIAGNOSIS — Z79899 Other long term (current) drug therapy: Secondary | ICD-10-CM | POA: Diagnosis not present

## 2023-08-20 DIAGNOSIS — Z7951 Long term (current) use of inhaled steroids: Secondary | ICD-10-CM | POA: Diagnosis not present

## 2023-08-20 DIAGNOSIS — N390 Urinary tract infection, site not specified: Secondary | ICD-10-CM | POA: Diagnosis not present

## 2023-08-20 DIAGNOSIS — N1832 Chronic kidney disease, stage 3b: Secondary | ICD-10-CM | POA: Diagnosis not present

## 2023-08-20 DIAGNOSIS — Z556 Problems related to health literacy: Secondary | ICD-10-CM | POA: Diagnosis not present

## 2023-08-20 DIAGNOSIS — K219 Gastro-esophageal reflux disease without esophagitis: Secondary | ICD-10-CM | POA: Diagnosis not present

## 2023-08-20 DIAGNOSIS — M1711 Unilateral primary osteoarthritis, right knee: Secondary | ICD-10-CM | POA: Diagnosis not present

## 2023-08-20 DIAGNOSIS — E66813 Obesity, class 3: Secondary | ICD-10-CM | POA: Diagnosis not present

## 2023-08-20 DIAGNOSIS — Z944 Liver transplant status: Secondary | ICD-10-CM | POA: Diagnosis not present

## 2023-08-20 DIAGNOSIS — J9601 Acute respiratory failure with hypoxia: Secondary | ICD-10-CM | POA: Diagnosis not present

## 2023-08-20 DIAGNOSIS — R1310 Dysphagia, unspecified: Secondary | ICD-10-CM | POA: Diagnosis not present

## 2023-08-20 DIAGNOSIS — D631 Anemia in chronic kidney disease: Secondary | ICD-10-CM | POA: Diagnosis not present

## 2023-08-20 DIAGNOSIS — E875 Hyperkalemia: Secondary | ICD-10-CM | POA: Diagnosis not present

## 2023-08-20 DIAGNOSIS — E039 Hypothyroidism, unspecified: Secondary | ICD-10-CM | POA: Diagnosis not present

## 2023-08-20 DIAGNOSIS — Z9181 History of falling: Secondary | ICD-10-CM | POA: Diagnosis not present

## 2023-08-20 DIAGNOSIS — I10 Essential (primary) hypertension: Secondary | ICD-10-CM | POA: Diagnosis not present

## 2023-08-20 DIAGNOSIS — M797 Fibromyalgia: Secondary | ICD-10-CM | POA: Diagnosis not present

## 2023-08-20 DIAGNOSIS — K529 Noninfective gastroenteritis and colitis, unspecified: Secondary | ICD-10-CM | POA: Diagnosis not present

## 2023-08-20 DIAGNOSIS — J690 Pneumonitis due to inhalation of food and vomit: Secondary | ICD-10-CM | POA: Diagnosis not present

## 2023-08-20 DIAGNOSIS — F200 Paranoid schizophrenia: Secondary | ICD-10-CM | POA: Diagnosis not present

## 2023-08-20 DIAGNOSIS — J4541 Moderate persistent asthma with (acute) exacerbation: Secondary | ICD-10-CM | POA: Diagnosis not present

## 2023-08-20 DIAGNOSIS — I493 Ventricular premature depolarization: Secondary | ICD-10-CM | POA: Diagnosis not present

## 2023-08-20 NOTE — Telephone Encounter (Signed)
 I can send 08/15/2023, but can not send 08/02/2023 that was not done here it was done at the hospital. I am printing labs from 08/15/2023 and faxing them over now.   Thanks!

## 2023-08-20 NOTE — Telephone Encounter (Signed)
 [name] with DSS calls nurse line in regards to [facility].   She reports she has faxed this multiple times, however has not received anything back.   She reports she is faxing over another FL2 now for provider to fill out.  Please let me know if you do not receive this.

## 2023-08-20 NOTE — Telephone Encounter (Signed)
-----   Message from [name] sent at 08/20/2023 10:19 AM EDT ----- Regarding: RE: labs faxed We have faxed them the same labs in the past since they sent over a standing order for the labs. Since they ordered the labs, I am not managing the results, they are. ----- Message ----- From: Carron Curie Sent: 08/20/2023  10:07 AM EDT To: [name], DO Subject: RE: labs faxed                                 We would need a ROI. ----- Message ----- From: [name], DO Sent: 08/20/2023   9:29 AM EDT To: Bevelyn Ngo Admin Subject: labs faxed                                     Good morning,   [name] sent over an order for labs for this patient and requested they be faxed:   [facility]  [address]  [place], [place] [address]  [phone]  The labs are cyclosporine from 08/15/23 and the CMP and CBC w/ diff from 08/02/23  Are yall able to fax those over?  Thanks! Lindalou Hose

## 2023-08-21 DIAGNOSIS — I10 Essential (primary) hypertension: Secondary | ICD-10-CM | POA: Diagnosis not present

## 2023-08-22 ENCOUNTER — Other Ambulatory Visit: Payer: Self-pay | Admitting: *Deleted

## 2023-08-22 DIAGNOSIS — M797 Fibromyalgia: Secondary | ICD-10-CM | POA: Diagnosis not present

## 2023-08-22 DIAGNOSIS — N1832 Chronic kidney disease, stage 3b: Secondary | ICD-10-CM | POA: Diagnosis not present

## 2023-08-22 DIAGNOSIS — K529 Noninfective gastroenteritis and colitis, unspecified: Secondary | ICD-10-CM | POA: Diagnosis not present

## 2023-08-22 DIAGNOSIS — Z944 Liver transplant status: Secondary | ICD-10-CM | POA: Diagnosis not present

## 2023-08-22 DIAGNOSIS — I10 Essential (primary) hypertension: Secondary | ICD-10-CM | POA: Diagnosis not present

## 2023-08-22 DIAGNOSIS — R1310 Dysphagia, unspecified: Secondary | ICD-10-CM | POA: Diagnosis not present

## 2023-08-22 DIAGNOSIS — J690 Pneumonitis due to inhalation of food and vomit: Secondary | ICD-10-CM | POA: Diagnosis not present

## 2023-08-22 DIAGNOSIS — F200 Paranoid schizophrenia: Secondary | ICD-10-CM | POA: Diagnosis not present

## 2023-08-22 DIAGNOSIS — M1711 Unilateral primary osteoarthritis, right knee: Secondary | ICD-10-CM | POA: Diagnosis not present

## 2023-08-22 DIAGNOSIS — E039 Hypothyroidism, unspecified: Secondary | ICD-10-CM | POA: Diagnosis not present

## 2023-08-22 DIAGNOSIS — G40909 Epilepsy, unspecified, not intractable, without status epilepticus: Secondary | ICD-10-CM | POA: Diagnosis not present

## 2023-08-22 DIAGNOSIS — J4541 Moderate persistent asthma with (acute) exacerbation: Secondary | ICD-10-CM | POA: Diagnosis not present

## 2023-08-22 DIAGNOSIS — Z9181 History of falling: Secondary | ICD-10-CM | POA: Diagnosis not present

## 2023-08-22 DIAGNOSIS — K219 Gastro-esophageal reflux disease without esophagitis: Secondary | ICD-10-CM | POA: Diagnosis not present

## 2023-08-22 DIAGNOSIS — E875 Hyperkalemia: Secondary | ICD-10-CM | POA: Diagnosis not present

## 2023-08-22 DIAGNOSIS — E66813 Obesity, class 3: Secondary | ICD-10-CM | POA: Diagnosis not present

## 2023-08-22 DIAGNOSIS — D631 Anemia in chronic kidney disease: Secondary | ICD-10-CM | POA: Diagnosis not present

## 2023-08-22 DIAGNOSIS — Z79899 Other long term (current) drug therapy: Secondary | ICD-10-CM | POA: Diagnosis not present

## 2023-08-22 DIAGNOSIS — Z7951 Long term (current) use of inhaled steroids: Secondary | ICD-10-CM | POA: Diagnosis not present

## 2023-08-22 DIAGNOSIS — N390 Urinary tract infection, site not specified: Secondary | ICD-10-CM | POA: Diagnosis not present

## 2023-08-22 DIAGNOSIS — I493 Ventricular premature depolarization: Secondary | ICD-10-CM | POA: Diagnosis not present

## 2023-08-22 DIAGNOSIS — J9601 Acute respiratory failure with hypoxia: Secondary | ICD-10-CM | POA: Diagnosis not present

## 2023-08-22 DIAGNOSIS — Z556 Problems related to health literacy: Secondary | ICD-10-CM | POA: Diagnosis not present

## 2023-08-22 NOTE — Telephone Encounter (Signed)
 Placed in MDs box to be filled out. Dalon Reichart Bruna Potter, CMA

## 2023-08-22 NOTE — Patient Instructions (Addendum)
 Visit Information  Ms. Hey was given information about Medicaid Managed Care team care coordination services as a part of their Healthy [facility] Medicaid benefit. Tanashia Angeleigh Chiasson verbally consented to engagement with the [facility] Managed Care team.   If you are experiencing a medical emergency, please call 911 or report to your local emergency department or urgent care.   If you have a non-emergency medical problem during routine business hours, please contact your provider's office and ask to speak with a nurse.   For questions related to your Healthy [facility] health plan, please call: [phone] or visit the homepage here: [email or web address]  If you would like to schedule transportation through your Healthy [facility] plan, please call the following number at least 2 days in advance of your appointment: [phone]  For information about your ride after you set it up, call Ride Assist at [phone]. Use this number to activate a Will Call pickup, or if your transportation is late for a scheduled pickup. Use this number, too, if you need to make a change or cancel a previously scheduled reservation.  If you need transportation services right away, call [phone]. The after-hours call center is staffed 24 hours to handle ride assistance and urgent reservation requests (including discharges) 365 days a year. Urgent trips include sick visits, hospital discharge requests and life-sustaining treatment.  Call the [facility] Line at [phone], at any time, 24 hours a day, 7 days a week. If you are in danger or need immediate medical attention call 911.  If you would like help to quit smoking, call 1-800-QUIT-NOW (778-070-2874) OR Espaol: 1-855-Djelo-Ya (6-967-893-8101) o para ms informacin haga clic aqu or Text READY to 751-025 to register via text  Ms. Divirgilio,   Please see education materials related to diet and ear pain  provided by MyChart link.  Patient verbalizes understanding of instructions and care plan provided today and agrees to view in MyChart. Active MyChart status and patient understanding of how to access instructions and care plan via MyChart confirmed with patient.     Telephone follow up appointment with Managed Medicaid care management team member scheduled for:09/19/23 at 1pm  Estanislado Emms RN, BSN [place]  Value-Based Care Institute [facility] Health RN Care Manager [phone]   Following is a copy of your plan of care:  Care Plan : RN Care Manager Plan of Care  Updates made by Heidi Dach, RN since 08/22/2023 12:00 AM     Problem: Health Management needs related to GYN and Weight Loss      Long-Range Goal: Development of Plan of Care to address Health Management needs related to GYN and Weight Loss   Start Date: 08/12/2023  Expected End Date: 11/10/2023  Note:   Current Barriers:  Knowledge Deficits related to plan of care for management of GYN Issue and Weight Loss   RNCM Clinical Goal(s):  Patient will verbalize understanding of plan for management of Abnormal Vaginal Bleeding and Weight Loss as evidenced by Patient reports attend all scheduled medical appointments: 08/27/23 with PCP and Dr. Raymondo Band, 08/29/23 with Neurology, 09/05/23 with [facility], 10/01/23 with Allergy and Asthma and 10/14/23 with [name] as evidenced by Provider documentation in EMR continue to work with RN Care Manager to address care management and care coordination needs related to  Abnormal Vaginal Bleeding and Weight Loss as evidenced by adherence to CM Team Scheduled appointments through collaboration with RN Care manager, provider, and care team.   Interventions: Evaluation of current treatment plan  related to  self management and patient's adherence to plan as established by provider Warm transfer to T. Mena Goes, discussed with patient   GYN Issue  (Status:  Goal on track:  Yes.)  Short Term  Goal Evaluation of current treatment plan related to  Abnormal Vaginal Bleeding ,  self-management and patient's adherence to plan as established by provider. Discussed plans with patient for ongoing care management follow up and provided patient with direct contact information for care management team Collaborated with GYN provider regarding patient needing to reschedule Endometrial biopsy performed in the OR Assessed social determinant of health barriers Ensured patient has no bleeding at this time Advised patient that she needs to be cleared by PCP before she can reschedule endometrial biopsy-scheduled with PCP on 08/27/23 GYN provider has communicated with PCP Provided patient with education on iron rich foods   Weight Loss Interventions:  (Status:  Goal on track:  Yes.) Long Term Goal Advised patient to discuss with primary care provider options regarding weight management Reviewed recommended dietary changes: avoid fad diets, make small/incremental dietary and exercise changes, eat at the table and avoid eating in front of the TV, plan management of cravings, monitor snacking and cravings in food diary Assessed social determinant of health barriers Provided patient with educational material via MyChart   Advised patient to contact [employer] [phone] for member benefits(weight loss) Advised patient to exercise 30 minutes a day, 5 days a week  Patient Goals/Self-Care Activities: Take all medications as prescribed Attend all scheduled provider appointments Attend church or other social activities Call provider office for new concerns or questions  Reschedule missed appointments  Follow Up Plan:  Telephone follow up appointment with care management team member scheduled for:  09/19/23 at 1pm

## 2023-08-22 NOTE — Patient Outreach (Addendum)
 Medicaid Managed Care   Nurse Care Manager Note  08/22/2023 Name:  [name] MRN:  [id number] DOB:  1975-11-30  [name] is an 48 y.o. year old female who is a primary patient of Lori Alley, DO.  The [facility] Managed Care Coordination team was consulted for assistance with:    Weight loss  Vaginal Bleeding  [name] was given information about Medicaid Managed Care Coordination team services today. [name] Patient agreed to services and verbal consent obtained.  Engaged with patient by telephone for follow up visit in response to provider referral for case management and/or care coordination services.   Patient is participating in a Managed Medicaid Plan:  Yes  Assessments/Interventions:  Review of past medical history, allergies, medications, health status, including review of consultants reports, laboratory and other test data, was performed as part of comprehensive evaluation and provision of chronic care management services.  SDOH (Social Drivers of Health) assessments and interventions performed: SDOH Interventions    Flowsheet Row Patient Outreach from 08/12/2023 in [place] [facility] DEPARTMENT Documentation from 05/03/2022 in [facility] - A Dept Of [place] Chronic Care Management from 02/08/2021 in [facility] - A Dept Of [place]  SDOH Interventions     Food Insecurity Interventions Intervention Not Indicated -- --  Housing Interventions Intervention Not Indicated -- --  Transportation Interventions Intervention Not Indicated Taxi Voucher Given --  [uses [place]AT and Medicaid Transportation]  Utilities Interventions Intervention Not Indicated -- --       Care Plan  Allergies  Allergen Reactions   Tramadol Hcl Itching and Nausea And Vomiting    Causes SEIZURES   Aspirin Nausea And Vomiting    Due to Stomach ulcer   Ibuprofen Other (See Comments)    Due to  stomach ulcer   Penicillins Itching, Swelling and Rash    Breakout  Has patient had a PCN reaction causing immediate rash, facial/tongue/throat swelling, SOB or lightheadedness with hypotension: Yes Has patient had a PCN reaction causing severe rash involving mucus membranes or skin necrosis: Yes Has patient had a PCN reaction that required hospitalization: Yes Has patient had a PCN reaction occurring within the last 10 years: No If all of the above answers are "NO", then may proceed with Cephalosporin use.      Pseudoephedrine Itching and Swelling    Tongue swelling   Sulfa Antibiotics Nausea And Vomiting   Acetaminophen Other (See Comments)    Other reaction(s): elevated LFT's   Guaifenesin Nausea And Vomiting   Latex Itching and Rash   Mucinex Fast-Max Nausea And Vomiting    Medications Reviewed Today   Medications were not reviewed in this encounter     Patient Active Problem List   Diagnosis Date Noted   Thrush 08/02/2023   Multifocal pneumonia 07/26/2023   Dysphagia 07/24/2023   Hypoalbuminemia 07/23/2023   Acute respiratory failure with hypoxia (HCC) 07/23/2023   Moderate persistent asthma with exacerbation 07/23/2023   Streptococcal pneumonia (HCC) 07/22/2023   Intellectual delay 07/22/2023   Hematuria, CKD3 07/22/2023   Fall 07/22/2023   Hyperkalemia 07/22/2023   Diarrhea 07/22/2023   Class 3 severe obesity with body mass index (BMI) of 45.0 to 49.9 in adult [facility]) 04/03/2023   Osteoarthritis of right knee 09/19/2022   Postmenopausal bleeding 06/25/2022   Right knee pain 05/16/2022   AKI (acute kidney injury) (HCC) 05/03/2022   Pap smear for cervical cancer  screening 11/27/2021   Impaired instrumental activities of daily living (IADL) 01/26/2021   Onychogryphosis 01/14/2020   Pedal edema 01/14/2020   Subclinical hypothyroidism 03/31/2019   Seasonal allergies 11/04/2018   Liver transplant recipient [facility]) 09/26/2018   Thrombocytopenia (HCC) 03/09/2013    Seizure disorder (HCC) 03/01/2013   Schizophrenia, paranoid type (HCC) 06/25/2012    Class: Chronic   Menorrhagia 12/22/2010   Anemia 03/03/2010   GERD 07/21/2008   CARDIAC FLOW MURMUR 10/16/2007   Asthma with allergic rhinitis 07/30/2006   Intellectual disability 07/25/2006   OSTEOARTHRITIS, LOWER LEG 07/25/2006    Conditions to be addressed/monitored per PCP order:   Weight Loss and Vaginal Bleeding  Care Plan : RN Care Manager Plan of Care  Updates made by Lori Dach, RN since 08/22/2023 12:00 AM     Problem: Health Management needs related to GYN and Weight Loss      Long-Range Goal: Development of Plan of Care to address Health Management needs related to GYN and Weight Loss   Start Date: 08/12/2023  Expected End Date: 11/10/2023  Note:   Current Barriers:  Knowledge Deficits related to plan of care for management of GYN Issue and Weight Loss   RNCM Clinical Goal(s):  Patient will verbalize understanding of plan for management of Abnormal Vaginal Bleeding and Weight Loss as evidenced by Patient reports attend all scheduled medical appointments: 08/27/23 with PCP and Dr. Raymondo Woodward, 08/29/23 with Neurology, 09/05/23 with [facility], 10/01/23 with Allergy and Asthma and 10/14/23 with [name] as evidenced by Provider documentation in EMR continue to work with RN Care Manager to address care management and care coordination needs related to  Abnormal Vaginal Bleeding and Weight Loss as evidenced by adherence to CM Team Scheduled appointments through collaboration with RN Care manager, provider, and care team.   Interventions: Evaluation of current treatment plan related to  self management and patient's adherence to plan as established by provider   GYN Issue  (Status:  Goal on track:  Yes.)  Short Term Goal Evaluation of current treatment plan related to  Abnormal Vaginal Bleeding ,  self-management and patient's adherence to plan as established by provider. Discussed plans with  patient for ongoing care management follow up and provided patient with direct contact information for care management team Collaborated with GYN provider regarding patient needing to reschedule Endometrial biopsy performed in the OR Assessed social determinant of health barriers Ensured patient has no bleeding at this time Advised patient that she needs to be cleared by PCP before she can reschedule endometrial biopsy-scheduled with PCP on 08/27/23 GYN provider has communicated with PCP Provided patient with education on iron rich foods   Weight Loss Interventions:  (Status:  Goal on track:  Yes.) Long Term Goal Advised patient to discuss with primary care provider options regarding weight management Reviewed recommended dietary changes: avoid fad diets, make small/incremental dietary and exercise changes, eat at the table and avoid eating in front of the TV, plan management of cravings, monitor snacking and cravings in food diary Assessed social determinant of health barriers Provided patient with educational material via MyChart   Advised patient to contact [employer] [phone] for member benefits(weight loss) Advised patient to exercise 30 minutes a day, 5 days a week  Patient Goals/Self-Care Activities: Take all medications as prescribed Attend all scheduled provider appointments Attend church or other social activities Call provider office for new concerns or questions  Reschedule missed appointments  Follow Up Plan:  Telephone follow up  appointment with care management team member scheduled for:  09/19/23 at 1pm      Follow Up:  Patient agrees to Care Plan and Follow-up.  Plan: The Managed Medicaid care management team will reach out to the patient again over the next 30 days.  Date/time of next scheduled RN care management/care coordination outreach:  09/19/23 at 1pm  Estanislado Emms RN, BSN   [facility] RN  Care Manager [phone]

## 2023-08-22 NOTE — Telephone Encounter (Signed)
 Jasmine with DSS came into office and dropped off FL2 form. I am completing a drop off form just for tracking.   Once completed she is wanting it faxed to her and a copy placed up front for her to pick up, please call her when placed up front.  Placing in [employer] team folder.   Please advise.  Thanks!

## 2023-08-23 DIAGNOSIS — I10 Essential (primary) hypertension: Secondary | ICD-10-CM | POA: Diagnosis not present

## 2023-08-24 DIAGNOSIS — I10 Essential (primary) hypertension: Secondary | ICD-10-CM | POA: Diagnosis not present

## 2023-08-25 DIAGNOSIS — I10 Essential (primary) hypertension: Secondary | ICD-10-CM | POA: Diagnosis not present

## 2023-08-26 DIAGNOSIS — K219 Gastro-esophageal reflux disease without esophagitis: Secondary | ICD-10-CM | POA: Diagnosis not present

## 2023-08-26 DIAGNOSIS — Z944 Liver transplant status: Secondary | ICD-10-CM | POA: Diagnosis not present

## 2023-08-26 DIAGNOSIS — Z79899 Other long term (current) drug therapy: Secondary | ICD-10-CM | POA: Diagnosis not present

## 2023-08-26 DIAGNOSIS — I10 Essential (primary) hypertension: Secondary | ICD-10-CM | POA: Diagnosis not present

## 2023-08-26 DIAGNOSIS — M797 Fibromyalgia: Secondary | ICD-10-CM | POA: Diagnosis not present

## 2023-08-26 DIAGNOSIS — Z556 Problems related to health literacy: Secondary | ICD-10-CM | POA: Diagnosis not present

## 2023-08-26 DIAGNOSIS — E039 Hypothyroidism, unspecified: Secondary | ICD-10-CM | POA: Diagnosis not present

## 2023-08-26 DIAGNOSIS — D631 Anemia in chronic kidney disease: Secondary | ICD-10-CM | POA: Diagnosis not present

## 2023-08-26 DIAGNOSIS — N390 Urinary tract infection, site not specified: Secondary | ICD-10-CM | POA: Diagnosis not present

## 2023-08-26 DIAGNOSIS — J4541 Moderate persistent asthma with (acute) exacerbation: Secondary | ICD-10-CM | POA: Diagnosis not present

## 2023-08-26 DIAGNOSIS — N1832 Chronic kidney disease, stage 3b: Secondary | ICD-10-CM | POA: Diagnosis not present

## 2023-08-26 DIAGNOSIS — J9601 Acute respiratory failure with hypoxia: Secondary | ICD-10-CM | POA: Diagnosis not present

## 2023-08-26 DIAGNOSIS — E875 Hyperkalemia: Secondary | ICD-10-CM | POA: Diagnosis not present

## 2023-08-26 DIAGNOSIS — K529 Noninfective gastroenteritis and colitis, unspecified: Secondary | ICD-10-CM | POA: Diagnosis not present

## 2023-08-26 DIAGNOSIS — R1310 Dysphagia, unspecified: Secondary | ICD-10-CM | POA: Diagnosis not present

## 2023-08-26 DIAGNOSIS — E66813 Obesity, class 3: Secondary | ICD-10-CM | POA: Diagnosis not present

## 2023-08-26 DIAGNOSIS — I493 Ventricular premature depolarization: Secondary | ICD-10-CM | POA: Diagnosis not present

## 2023-08-26 DIAGNOSIS — G40909 Epilepsy, unspecified, not intractable, without status epilepticus: Secondary | ICD-10-CM | POA: Diagnosis not present

## 2023-08-26 DIAGNOSIS — J690 Pneumonitis due to inhalation of food and vomit: Secondary | ICD-10-CM | POA: Diagnosis not present

## 2023-08-26 DIAGNOSIS — Z9181 History of falling: Secondary | ICD-10-CM | POA: Diagnosis not present

## 2023-08-26 DIAGNOSIS — F200 Paranoid schizophrenia: Secondary | ICD-10-CM | POA: Diagnosis not present

## 2023-08-26 DIAGNOSIS — Z7951 Long term (current) use of inhaled steroids: Secondary | ICD-10-CM | POA: Diagnosis not present

## 2023-08-26 DIAGNOSIS — M1711 Unilateral primary osteoarthritis, right knee: Secondary | ICD-10-CM | POA: Diagnosis not present

## 2023-08-27 ENCOUNTER — Encounter: Payer: Self-pay | Admitting: Pharmacist

## 2023-08-27 ENCOUNTER — Ambulatory Visit: Admitting: Student

## 2023-08-27 ENCOUNTER — Ambulatory Visit: Admitting: Pharmacist

## 2023-08-27 VITALS — BP 134/84 | HR 92 | Wt 228.8 lb

## 2023-08-27 VITALS — BP 138/82 | HR 97 | Ht 59.0 in | Wt 228.8 lb

## 2023-08-27 DIAGNOSIS — H6692 Otitis media, unspecified, left ear: Secondary | ICD-10-CM

## 2023-08-27 DIAGNOSIS — I10 Essential (primary) hypertension: Secondary | ICD-10-CM | POA: Diagnosis not present

## 2023-08-27 DIAGNOSIS — F819 Developmental disorder of scholastic skills, unspecified: Secondary | ICD-10-CM | POA: Diagnosis not present

## 2023-08-27 MED ORDER — DOXYCYCLINE HYCLATE 100 MG PO CAPS
100.0000 mg | ORAL_CAPSULE | Freq: Two times a day (BID) | ORAL | 0 refills | Status: AC
Start: 2023-08-27 — End: 2023-09-03

## 2023-08-27 MED ORDER — ACCU-CHEK GUIDE TEST VI STRP: ORAL_STRIP | 12 refills | Status: AC

## 2023-08-27 NOTE — Patient Instructions (Signed)
 It was great to see you! Thank you for allowing me to participate in your care!  I recommend that you always bring your medications to each appointment as this makes it easy to ensure you are on the correct medications and helps [place] not miss when refills are needed.  Our plans for today:  - For an ear infection and sinus infection, take doxycycline twice daily for 7 days.  - return for follow up with me in 1-2 weeks  - Use flonase 2 sprays in each nostril twice daily    Take care and seek immediate care sooner if you develop any concerns.   Dr. [name], DO [facility]

## 2023-08-27 NOTE — Patient Instructions (Addendum)
 It was nice to see you today  Use GREEN BOXES first In Two weeks please add propranolol to the purple box  Add IRON     Medication Changes: None Please pickup refills for the following medications prior to your next visit/refill  Bring Glucose Meter to visit in two weeks.    Refills of medications need for next fill include: Cyclosporin Doxepin Famotidine Glucose Test Strips Propranolol 10mg  Risperidone   Ferrous Sulfate - OVER THE COUNTER

## 2023-08-27 NOTE — Progress Notes (Unsigned)
    SUBJECTIVE:   CHIEF COMPLAINT / HPI:   The patient presents with left ear pain and hearing loss that started last week. She describes a louder sound in L ear when patting the top of her head and numbness on the left side of her neck. She also reports green nasal discharge from the left nostril and productive cough with green sputum. She denies fever, chills, vomiting, diarrhea, and body aches. She has been using her asthma inhalers and has occasional wheezing but no significant breathing difficulty.  PERTINENT  PMH / PSH: Asthma, s/p liver transplant  OBJECTIVE:   BP 138/82   Pulse 97   Ht 4\' 11"  (1.499 m)   Wt 228 lb 12.8 oz (103.8 kg)   LMP 12/02/2022   SpO2 100%   BMI 46.21 kg/m    GENERAL: Alert, well appearing  HEENT: Normocephalic, normal oropharynx, moist mucous membranes. Right ear canal obstructed by cerumen. Left eardrum red, dull and slightly bulging.  Mildly Swollen mobile lymph nodes under chin on left side.  No pain with percussion of maxillary sinuses CHEST: Clear to auscultation bilaterally. No wheezes, rhonchi, or crackles.  Breathing comfortably on room air CARDIOVASCULAR: Normal heart rate and rhythm, S1 and S2 normal without murmurs. ABDOMEN: Soft, non-tender, non-distended, normal bowel sounds.   ASSESSMENT/PLAN:   Left otitis media History and exam concerning for left acute otitis media and possibly sinusitis.  Augmentin is considered for treatment, but due to her allergies to penicillin, doxycycline is an alternative option. Flonase nasal spray is recommended to manage nasal symptoms. - Prescribe doxycycline - Continue using Flonase nasal spray - Return in 1 to 2 weeks for follow-up or sooner if symptoms worsen     Dr. [name], DO [facility]

## 2023-08-27 NOTE — Assessment & Plan Note (Signed)
 Intellectual disability history and polypharmacy - Medication Reconciliation - Medication list reviewed and updated.  History of Liver transplant.  Understanding of regimen: good  Understanding of indications: good  Potential of compliance: good with the use of pill box. TWO weeks of most prescription medications filled into pill boxes. Third week was partially filled with only 1 prescription medication missing.  Patient aware to add iron sulfate when available OTC All Meds filled except propranolog which she is having refilled.  Patient will return in 2.5 weeks on Thursday for refill of up to 3 weeks supply.

## 2023-08-27 NOTE — Progress Notes (Signed)
    S:     Chief Complaint  Patient presents with   Medication Management    Medication management - medication box fill    48 y.o. who presents for medication management due to concern of polypharmacy.  PMH is significant for liver transplant 06/2013 and intellectual disability   Patient was referred and last seen by Primary Care Provider, Dr. Yetta Barre  on 08/02/2023.  Recently visited ED for concern of elevated creatinine which resolved.   Patient arrives in good spirits and presents with assistance of walker. During this visit we attempted to fill 3 week supply  Insurance Coverage: Medicaid  Do you know what each of your medicines is for? yes Do you feel that your medications are working for you? yes Have you been experiencing any side effects to the medications prescribed? no   O:  Physical Exam Vitals reviewed.  Pulmonary:     Effort: Pulmonary effort is normal.  Neurological:     Mental Status: She is alert.  Psychiatric:        Mood and Affect: Mood normal.        Thought Content: Thought content normal.     Review of Systems  All other systems reviewed and are negative.   Vitals:   08/27/23 1529 08/27/23 1530  BP: (!) 132/96 134/84  Pulse: 92   SpO2: 100%      A/P: Intellectual disability history and polypharmacy - Medication Reconciliation - Medication list reviewed and updated.  History of Liver transplant.  Understanding of regimen: good  Understanding of indications: good  Potential of compliance: good with the use of pill box. TWO weeks of most prescription medications filled into pill boxes. Third week was partially filled with only 1 prescription medication missing.  Patient aware to add iron sulfate when available OTC All Meds filled except propranolog which she is having refilled.  Patient will return in 2.5 weeks on Thursday for refill of up to 3 weeks supply.     Refills of medications need for next fill  include: Cyclosporin Doxepin Famotidine Glucose Test Strips Propranolol 10mg  Risperidone   Ferrous Sulfate - OVER THE COUNTER   Written patient instructions and updated medication list provided. Total time in face to face counseling 32 minutes.    Follow up Pharmacist ~ 2 weeks PCP Clinic Visit 3  weeks Patient seen with Threasa Heads, PharmD Candidate and Darolyn Rua, PharmD Candidate.

## 2023-08-28 DIAGNOSIS — H6692 Otitis media, unspecified, left ear: Secondary | ICD-10-CM | POA: Insufficient documentation

## 2023-08-28 DIAGNOSIS — I10 Essential (primary) hypertension: Secondary | ICD-10-CM | POA: Diagnosis not present

## 2023-08-28 HISTORY — DX: Otitis media, unspecified, left ear: H66.92

## 2023-08-28 NOTE — Assessment & Plan Note (Signed)
 History and exam concerning for left acute otitis media and possibly sinusitis.  Augmentin is considered for treatment, but due to her allergies to penicillin, doxycycline is an alternative option. Flonase nasal spray is recommended to manage nasal symptoms. - Prescribe doxycycline - Continue using Flonase nasal spray - Return in 1 to 2 weeks for follow-up or sooner if symptoms worsen

## 2023-08-29 ENCOUNTER — Encounter: Payer: Self-pay | Admitting: Neurology

## 2023-08-29 ENCOUNTER — Ambulatory Visit: Admitting: Neurology

## 2023-08-29 DIAGNOSIS — Z556 Problems related to health literacy: Secondary | ICD-10-CM | POA: Diagnosis not present

## 2023-08-29 DIAGNOSIS — M1711 Unilateral primary osteoarthritis, right knee: Secondary | ICD-10-CM | POA: Diagnosis not present

## 2023-08-29 DIAGNOSIS — N390 Urinary tract infection, site not specified: Secondary | ICD-10-CM | POA: Diagnosis not present

## 2023-08-29 DIAGNOSIS — D631 Anemia in chronic kidney disease: Secondary | ICD-10-CM | POA: Diagnosis not present

## 2023-08-29 DIAGNOSIS — N1832 Chronic kidney disease, stage 3b: Secondary | ICD-10-CM | POA: Diagnosis not present

## 2023-08-29 DIAGNOSIS — G40909 Epilepsy, unspecified, not intractable, without status epilepticus: Secondary | ICD-10-CM

## 2023-08-29 DIAGNOSIS — E66813 Obesity, class 3: Secondary | ICD-10-CM | POA: Diagnosis not present

## 2023-08-29 DIAGNOSIS — F200 Paranoid schizophrenia: Secondary | ICD-10-CM | POA: Diagnosis not present

## 2023-08-29 DIAGNOSIS — J690 Pneumonitis due to inhalation of food and vomit: Secondary | ICD-10-CM | POA: Diagnosis not present

## 2023-08-29 DIAGNOSIS — E039 Hypothyroidism, unspecified: Secondary | ICD-10-CM | POA: Diagnosis not present

## 2023-08-29 DIAGNOSIS — E875 Hyperkalemia: Secondary | ICD-10-CM | POA: Diagnosis not present

## 2023-08-29 DIAGNOSIS — K219 Gastro-esophageal reflux disease without esophagitis: Secondary | ICD-10-CM | POA: Diagnosis not present

## 2023-08-29 DIAGNOSIS — Z79899 Other long term (current) drug therapy: Secondary | ICD-10-CM | POA: Diagnosis not present

## 2023-08-29 DIAGNOSIS — J4541 Moderate persistent asthma with (acute) exacerbation: Secondary | ICD-10-CM | POA: Diagnosis not present

## 2023-08-29 DIAGNOSIS — I493 Ventricular premature depolarization: Secondary | ICD-10-CM | POA: Diagnosis not present

## 2023-08-29 DIAGNOSIS — K529 Noninfective gastroenteritis and colitis, unspecified: Secondary | ICD-10-CM | POA: Diagnosis not present

## 2023-08-29 DIAGNOSIS — Z944 Liver transplant status: Secondary | ICD-10-CM | POA: Diagnosis not present

## 2023-08-29 DIAGNOSIS — M797 Fibromyalgia: Secondary | ICD-10-CM | POA: Diagnosis not present

## 2023-08-29 DIAGNOSIS — R1310 Dysphagia, unspecified: Secondary | ICD-10-CM | POA: Diagnosis not present

## 2023-08-29 DIAGNOSIS — Z9181 History of falling: Secondary | ICD-10-CM | POA: Diagnosis not present

## 2023-08-29 DIAGNOSIS — F79 Unspecified intellectual disabilities: Secondary | ICD-10-CM

## 2023-08-29 DIAGNOSIS — J9601 Acute respiratory failure with hypoxia: Secondary | ICD-10-CM | POA: Diagnosis not present

## 2023-08-29 DIAGNOSIS — I10 Essential (primary) hypertension: Secondary | ICD-10-CM | POA: Diagnosis not present

## 2023-08-29 DIAGNOSIS — Z7951 Long term (current) use of inhaled steroids: Secondary | ICD-10-CM | POA: Diagnosis not present

## 2023-08-29 MED ORDER — DIVALPROEX SODIUM ER 500 MG PO TB24: 1000.0000 mg | ORAL_TABLET | Freq: Every day | ORAL | 3 refills | Status: DC

## 2023-08-29 NOTE — Progress Notes (Signed)
 ASSESSMENT AND PLAN  year old female    1.  Seizure disorder 2.  Intellectual disability 3.  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   Normal MRI of brain in July 2020.  HISTORY : [name] is a 48 year old female, has been followed up by our clinic for many years for epilepsy, she has mild mental retardation, her uncle Toma Erichsen lives next door to her, is her power of attorney.  She has longstanding history of epilepsy, well-controlled on Depakote ER 500 mg 2 tablets every night,  She had a history of primary biliary cirrhosis, had liver transplant, is followed up by [name] transplant team, doing well,  Laboratory evaluation in December 2023: Ferritin level 483, CMP creatinine 1.06, calcium 8.6, hemoglobin of 10.2, normal TSH, A1c  Around 2015, she was having frequent seizure, also developed psychosis episode taking Keppra or Vimpat at that time, then switched to single agent Depakote   Most recent seizure-like episode was in September 2023, woke up her bed was unkept, which is very different from her baseline  UPDATE August 29 2023: She is brought in by transportation alone at visit, no recurrent seizure,  EEG August 2024 showed mild background slowing  I reviewed her liver transplant team evaluation from April 16, 2023, orthotopic liver transplant July 11, 2013 secondary to primary biliary cholangitis,  allograft function well on monotherapy cyclosporine 100 mg twice a day, concerned about her slow steady weight gain, reviewed record, her weight has been stable  Laboratory evaluations November 2024, hemoglobin of 10.2, creatinine 0.9, cyclosporine level was within therapeutic 124  Hospital admission to [facility] February 24-28 2025 for streptococcal pneumonia, also have paranoid schizophrenia, required 3 L oxygen, was treated with Levaquin for 7 days  ER August 02, 2023 for elevation of creatinine March for 2.17, repeat lab showed much improvement creatinine of 1, GFR more than 60  REVIEW OF SYSTEMS: Out of a complete 14 system review of symptoms, the patient complains only of the following symptoms, and all other reviewed systems are negative.  n/a  PHYSICAL EXAM  Vitals:   06/07/22 1126  BP: (!) 134/91  Pulse: 78  Weight: 227 lb 8 oz (103.2 kg)  Height: 4\' 11"  (1.499 m)    Body mass index is 45.95 kg/m.  PHYSICAL EXAMNIATION:  Gen: NAD, conversant, well nourised, well groomed                     Cardiovascular: Regular rate rhythm, no peripheral edema, warm, nontender. Eyes: Conjunctivae clear without exudates or hemorrhage Neck: Supple, no carotid bruits. Pulmonary: Clear to auscultation bilaterally   NEUROLOGICAL EXAM:  MENTAL STATUS: Speech/cognition: Awake, alert oriented to history taking and casual conversation  CRANIAL NERVES: CN II: Visual fields are full to confrontation.  Pupils are round equal and briskly reactive to light. CN III, IV, VI: Left eye dominant, right esotropia CN V: Facial sensation is intact  to pinprick in all 3 divisions bilaterally. Corneal responses are intact.  CN VII: Face is symmetric with normal eye closure and smile. CN VIII: Hearing is normal to casual conversation CN IX, X: Palate elevates symmetrically. Phonation is normal. CN XI: Head turning and shoulder shrug are intact CN XII: Tongue is midline with normal movements and no  atrophy.  MOTOR: Normal muscle strengths  REFLEXES: Reflexes are present and symmetric  SENSORY: Intact to light touch,   COORDINATION: No dysmetria or truncal ataxia  GAIT/STANCE: Need push-up to get up from seated position, cautious  ALLERGIES: Allergies  Allergen Reactions   Tramadol Hcl Itching and Nausea And Vomiting    Causes SEIZURES   Aspirin Nausea And Vomiting    Due to Stomach ulcer   Ibuprofen Other (See Comments)    Due to stomach ulcer   Penicillins Itching, Swelling and Rash    Breakout  Has patient had a PCN reaction causing immediate rash, facial/tongue/throat swelling, SOB or lightheadedness with hypotension: Yes Has patient had a PCN reaction causing severe rash involving mucus membranes or skin necrosis: Yes Has patient had a PCN reaction that required hospitalization: Yes Has patient had a PCN reaction occurring within the last 10 years: No If all of the above answers are "NO", then may proceed with Cephalosporin use.      Pseudoephedrine Itching and Swelling    Tongue swelling   Sulfa Antibiotics Nausea And Vomiting   Acetaminophen Other (See Comments)    Other reaction(s): elevated LFT's   Guaifenesin Nausea And Vomiting   Latex Itching and Rash   Mucinex Fast-Max Nausea And Vomiting    HOME MEDICATIONS: Outpatient Medications Prior to Visit  Medication Sig Dispense Refill   albuterol (PROVENTIL) (2.5 MG/3ML) 0.083% nebulizer solution Take 3 mLs (2.5 mg total) by nebulization every 4 (four) hours as needed for wheezing or shortness of breath. 1 vial via nebulizer every 4-6 hours as needed. 150 mL 1   ammonium lactate (AMLACTIN) 12 % cream Apply 1 Application topically 2 (two) times daily.     azelastine (ASTELIN) 0.1 % nasal spray Place 2 sprays into both nostrils 2 (two) times daily. 90 mL 1   benztropine (COGENTIN) 1 MG tablet Take 1 tablet (1 mg total) by mouth 2 (two) times daily.     budesonide (PULMICORT) 0.5 MG/2ML nebulizer solution Take  2 mLs (0.5 mg total) by nebulization every 4 (four) hours as needed. (Patient taking differently: Take 0.5 mg by nebulization every 4 (four) hours as needed (Asthma).) 150 mL 1   budesonide-formoterol (SYMBICORT) 160-4.5 MCG/ACT inhaler Inhale 2 puffs into the lungs 2 (two) times daily. Use with spacer and mask. 30.6 g 1   carboxymethylcellulose (REFRESH PLUS) 0.5 % SOLN Place 1 drop into both eyes 4 (four) times daily as needed (for dry eyes).     cetirizine (ZYRTEC) 10 MG tablet Take 1 tablet (10 mg total) by mouth daily as needed for allergies (Can use an extra dose during flares). (Patient taking differently: Take 10 mg by mouth once a week.) 180 tablet 1   cycloSPORINE modified 100 MG CAPS Take 100 mg by mouth 2 (two) times daily.     divalproex (DEPAKOTE ER) 500 MG 24 hr tablet Take 2 tablets (1,000 mg total) by mouth at bedtime. 180 tablet 3   doxepin (SINEQUAN) 25 MG capsule Take 1 capsule (25 mg total) by mouth at bedtime. 90 capsule 3   doxycycline (VIBRAMYCIN) 100 MG capsule Take 1 capsule (100 mg  total) by mouth 2 (two) times daily for 7 days. 14 capsule 0   famotidine (PEPCID) 20 MG tablet Take 1 tablet (20 mg total) by mouth 2 (two) times daily. 180 tablet 3   ferrous sulfate 325 (65 FE) MG EC tablet Take 1 tablet (325 mg total) by mouth daily.     fluticasone (FLONASE) 50 MCG/ACT nasal spray Place 1 spray into both nostrils in the morning and at bedtime. 48 g 1   fluticasone (FLOVENT HFA) 110 MCG/ACT inhaler Inhale 2 puffs into the lungs 2 (two) times daily. Only during asthma flare ups 36 g 1   glucose blood (ACCU-CHEK GUIDE TEST) test strip Use as instructed 100 each 12   levothyroxine (SYNTHROID) 75 MCG tablet TAKE 1 TABLET(75 MCG) BY MOUTH EVERY MORNING 30 MINUTES BEFORE FOOD 90 tablet 1   lidocaine (HM LIDOCAINE PATCH) 4 % Place 1 patch onto the skin daily. 30 patch 1   mometasone (ELOCON) 0.1 % cream Apply topically 2 (two) times daily as needed. Can use on skin one to two times  daily if needed. 135 g 1   montelukast (SINGULAIR) 10 MG tablet Take 1 tablet (10 mg total) by mouth at bedtime. 90 tablet 1   Multiple Vitamin (MULTIVITAMIN) tablet Take 2 tablets by mouth daily. 9 am.     Nebulizers MISC 1 kit by Does not apply route as directed. 1 kit 1   omeprazole (PRILOSEC) 40 MG capsule TAKE 1 CAPSULE(40 MG) BY MOUTH TWICE DAILY 180 capsule 1   polyethylene glycol (MIRALAX / GLYCOLAX) packet Take 17 g by mouth daily as needed (for constipation). In water or juice     prazosin (MINIPRESS) 2 MG capsule Take 1 capsule (2 mg total) by mouth at bedtime. 90 capsule 3   propranolol (INDERAL) 10 MG tablet Take 1 tablet (10 mg total) by mouth 2 (two) times daily. 60 tablet 11   RA VITAMIN D-3 50 MCG (2000 UT) CAPS Take 1 capsule (2,000 Units total) by mouth daily. 90 capsule 3   Respiratory Therapy Supplies (NEBULIZER/TUBING/MOUTHPIECE) KIT 2 each by Does not apply route as directed. 2 kit 5   risperiDONE (RISPERDAL) 3 MG tablet Take 0.5-1 tablets (1.5-3 mg total) by mouth 2 (two) times daily. 1/2 of the 3mg  tablet in AM and one 3mg  tablet in the PM 45 tablet 3   Spacer/Aero-Hold Chamber Mask MISC Use as directed with inhaler. 1 each 1   Tiotropium Bromide Monohydrate (SPIRIVA RESPIMAT) 1.25 MCG/ACT AERS Inhale 2 puffs into the lungs in the morning. 12 g 1   VENTOLIN HFA 108 (90 Base) MCG/ACT inhaler Inhale 2 puffs into the lungs every 4 (four) hours as needed for wheezing or shortness of breath. 18 g 1   Wheat Dextrin (BENEFIBER DRINK MIX PO) Take 1 Scoop by mouth 3 (three) times a week.     No facility-administered medications prior to visit.    PAST MEDICAL HISTORY: Past Medical History:  Diagnosis Date   Allergic reaction to vaccine 09/11/2021   Asthma, well controlled, moderate persistent    followed by dr Lucie Leather (allergy/ asthma center) and pcp   Chronic constipation    CKD (chronic kidney disease), stage III 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 outlaw   History of Bell's palsy 11/2018   left side  History of gastric ulcer    IDA (iron deficiency anemia)    Immunosuppression due to drug therapy (HCC)    Inflammatory dermatosis    hands   Intellectual disability    developmental delay   Liver transplant recipient [facility]) 06/2013   followed by [name]--- Dr Maryan Char;   for ESLD secondary PBC   Morbid obesity (HCC) 07/25/2006   Qualifier: Diagnosis of   By: Mannie Stabile MD, JOSEPH         OA (osteoarthritis)    OSA (obstructive sleep apnea)    study many years ago,  no cpap  (study available)   Perennial allergic rhinitis    PMB (postmenopausal bleeding)    PONV (postoperative nausea and vomiting)    Primary biliary cirrhosis (HCC) 2012   s/p  liver transplant in 2015   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 Dulce Sellar   ESOPHAGOGASTRODUODENOSCOPY (EGD) WITH PROPOFOL N/A 10/01/2012   Procedure: ESOPHAGOGASTRODUODENOSCOPY (EGD) WITH PROPOFOL;  Surgeon: Willis Modena, MD;  Location: [facility];  Service: Endoscopy;  Laterality: N/A;   IR US LIVER BIOPSY  04/24/2011   @ [facility]   LAPAROSCOPIC CHOLECYSTECTOMY  12/29/2010   @ [facility] by dr d. Magnus Ivan;   AND LAPAROSCOPIC APPENDECTOMY   LIVER TRANSPLANT  07/11/2012   @ [name]   ;   Orthotopic   STRABISMUS SURGERY     CHILD   TYMPANOSTOMY TUBE PLACEMENT Bilateral    CHILD    FAMILY HISTORY: Family History  Problem Relation Age of Onset   Diabetes Mother    Heart disease Mother    COPD Mother     Breast cancer Cousin    Allergic rhinitis Neg Hx    Angioedema Neg Hx    Asthma Neg Hx    Atopy Neg Hx    Eczema Neg Hx    Immunodeficiency Neg Hx    Urticaria Neg Hx     SOCIAL HISTORY: Social History   Socioeconomic History   Marital status: Single    Spouse name: Not on file   Number of children: 0   Years of education: 12 th   Highest education level: Not on file  Occupational History    Comment: Disabled  Tobacco Use   Smoking status: Never    Passive exposure: Never   Smokeless tobacco: Never  Vaping Use   Vaping status: Never Used  Substance and Sexual Activity   Alcohol use: No    Alcohol/week: 0.0 standard drinks of alcohol   Drug use: No   Sexual activity: Never  Other Topics Concern   Not on file  Social History Narrative   Lives alone but uncle and his wife live next door and are her primary caregivers. Dorene Sorrow (uncle) can be reached at [phone].    Education High school education.   Disabled.   Right handed.   Caffeine tea and coffee daily.         Current Social History   (Please include date ( . td) when updating information )      Who lives at  home: lives  alone 07/05/2016    Transportation: uses Medicaid transportation to and from appointments 07/05/2016   Important Relationships & Pets: Ashley Royalty lives next door 07/05/2016    Current Stressors: Person Care aid will stop this month 07/05/2016   Work / Education:  Receives disability 07/05/2016   Religious / Personal Beliefs: has strong religious beliefs 07/05/2016   Interests / Fun: Enjoys going to bible study  07/05/2016   Other: enjoys reading her bible 07/05/2016                                                                                                         Social Drivers of Health   Financial Resource Strain: Low Risk  (02/08/2021)   Overall Financial Resource Strain (CARDIA)    Difficulty of Paying Living Expenses: Not very hard  Food Insecurity: No Food Insecurity (08/12/2023)   Hunger  Vital Sign    Worried About Running Out of Food in the Last Year: Never true    Ran Out of Food in the Last Year: Never true  Transportation Needs: No Transportation Needs (08/12/2023)   PRAPARE - Administrator, Civil Service (Medical): No    Lack of Transportation (Non-Medical): No  Physical Activity: Not on file  Stress: No Stress Concern Present (02/08/2021)   [employer] of Occupational Health - Occupational Stress Questionnaire    Feeling of Stress : Only a little  Social Connections: Not on file  Intimate Partner Violence: Not At Risk (08/12/2023)   Humiliation, Afraid, Rape, and Kick questionnaire    Fear of Current or Ex-Partner: No    Emotionally Abused: No    Physically Abused: No    Sexually Abused: No    Levert Feinstein, M.D. Ph.D.  [facility] [address] [place], [place] [address] Phone: [phone] Fax:      [phone]

## 2023-08-29 NOTE — Progress Notes (Signed)
 Reviewed and agree with Dr Macky Lower plan.

## 2023-08-30 DIAGNOSIS — I10 Essential (primary) hypertension: Secondary | ICD-10-CM | POA: Diagnosis not present

## 2023-08-31 DIAGNOSIS — I10 Essential (primary) hypertension: Secondary | ICD-10-CM | POA: Diagnosis not present

## 2023-09-01 DIAGNOSIS — I10 Essential (primary) hypertension: Secondary | ICD-10-CM | POA: Diagnosis not present

## 2023-09-02 DIAGNOSIS — I10 Essential (primary) hypertension: Secondary | ICD-10-CM | POA: Diagnosis not present

## 2023-09-03 ENCOUNTER — Telehealth: Payer: Self-pay

## 2023-09-03 DIAGNOSIS — N1832 Chronic kidney disease, stage 3b: Secondary | ICD-10-CM | POA: Diagnosis not present

## 2023-09-03 DIAGNOSIS — E875 Hyperkalemia: Secondary | ICD-10-CM | POA: Diagnosis not present

## 2023-09-03 DIAGNOSIS — E039 Hypothyroidism, unspecified: Secondary | ICD-10-CM | POA: Diagnosis not present

## 2023-09-03 DIAGNOSIS — Z9181 History of falling: Secondary | ICD-10-CM | POA: Diagnosis not present

## 2023-09-03 DIAGNOSIS — J9601 Acute respiratory failure with hypoxia: Secondary | ICD-10-CM | POA: Diagnosis not present

## 2023-09-03 DIAGNOSIS — N390 Urinary tract infection, site not specified: Secondary | ICD-10-CM | POA: Diagnosis not present

## 2023-09-03 DIAGNOSIS — I493 Ventricular premature depolarization: Secondary | ICD-10-CM | POA: Diagnosis not present

## 2023-09-03 DIAGNOSIS — J4541 Moderate persistent asthma with (acute) exacerbation: Secondary | ICD-10-CM | POA: Diagnosis not present

## 2023-09-03 DIAGNOSIS — R1310 Dysphagia, unspecified: Secondary | ICD-10-CM | POA: Diagnosis not present

## 2023-09-03 DIAGNOSIS — Z944 Liver transplant status: Secondary | ICD-10-CM | POA: Diagnosis not present

## 2023-09-03 DIAGNOSIS — D631 Anemia in chronic kidney disease: Secondary | ICD-10-CM | POA: Diagnosis not present

## 2023-09-03 DIAGNOSIS — Z7951 Long term (current) use of inhaled steroids: Secondary | ICD-10-CM | POA: Diagnosis not present

## 2023-09-03 DIAGNOSIS — M1711 Unilateral primary osteoarthritis, right knee: Secondary | ICD-10-CM | POA: Diagnosis not present

## 2023-09-03 DIAGNOSIS — I10 Essential (primary) hypertension: Secondary | ICD-10-CM | POA: Diagnosis not present

## 2023-09-03 DIAGNOSIS — M797 Fibromyalgia: Secondary | ICD-10-CM | POA: Diagnosis not present

## 2023-09-03 DIAGNOSIS — K219 Gastro-esophageal reflux disease without esophagitis: Secondary | ICD-10-CM | POA: Diagnosis not present

## 2023-09-03 DIAGNOSIS — Z79899 Other long term (current) drug therapy: Secondary | ICD-10-CM | POA: Diagnosis not present

## 2023-09-03 DIAGNOSIS — K529 Noninfective gastroenteritis and colitis, unspecified: Secondary | ICD-10-CM | POA: Diagnosis not present

## 2023-09-03 DIAGNOSIS — G40909 Epilepsy, unspecified, not intractable, without status epilepticus: Secondary | ICD-10-CM | POA: Diagnosis not present

## 2023-09-03 DIAGNOSIS — J690 Pneumonitis due to inhalation of food and vomit: Secondary | ICD-10-CM | POA: Diagnosis not present

## 2023-09-03 DIAGNOSIS — Z556 Problems related to health literacy: Secondary | ICD-10-CM | POA: Diagnosis not present

## 2023-09-03 DIAGNOSIS — E66813 Obesity, class 3: Secondary | ICD-10-CM | POA: Diagnosis not present

## 2023-09-03 DIAGNOSIS — F200 Paranoid schizophrenia: Secondary | ICD-10-CM | POA: Diagnosis not present

## 2023-09-03 NOTE — Telephone Encounter (Signed)
 Called patient to schedule surgery w/ Dr. Berton Lan @[facility] on 10/08/23 at 3:30 pm. Patient states she would like procedure scheduled. I provided pre-op instructions and surgery details by phone. Patient asked that written details be sent to her Mychart acct.

## 2023-09-04 DIAGNOSIS — I10 Essential (primary) hypertension: Secondary | ICD-10-CM | POA: Diagnosis not present

## 2023-09-05 ENCOUNTER — Ambulatory Visit: Payer: Medicaid Other | Admitting: Podiatry

## 2023-09-05 DIAGNOSIS — I10 Essential (primary) hypertension: Secondary | ICD-10-CM | POA: Diagnosis not present

## 2023-09-06 ENCOUNTER — Telehealth: Payer: Self-pay

## 2023-09-06 ENCOUNTER — Other Ambulatory Visit: Payer: Self-pay | Admitting: Student

## 2023-09-06 DIAGNOSIS — Z944 Liver transplant status: Secondary | ICD-10-CM

## 2023-09-06 DIAGNOSIS — I10 Essential (primary) hypertension: Secondary | ICD-10-CM | POA: Diagnosis not present

## 2023-09-06 NOTE — Progress Notes (Signed)
 [name] transplant clinic requests updated CMP, CBC and repeat cyclosporine levels.  These have been ordered for future lab visit which has already been scheduled.  These results will need to be faxed to [facility] liver transplant clinic.

## 2023-09-06 NOTE — Telephone Encounter (Signed)
 [name] with [name] calls nurse line requesting labs.   She reports the patient needs an updated CBC and CMP.  She reports she needs a repeat Cyclosporine.  Advised will forward to PCP to place orders for lab only visit.

## 2023-09-07 DIAGNOSIS — I10 Essential (primary) hypertension: Secondary | ICD-10-CM | POA: Diagnosis not present

## 2023-09-08 DIAGNOSIS — I10 Essential (primary) hypertension: Secondary | ICD-10-CM | POA: Diagnosis not present

## 2023-09-09 DIAGNOSIS — I10 Essential (primary) hypertension: Secondary | ICD-10-CM | POA: Diagnosis not present

## 2023-09-10 ENCOUNTER — Other Ambulatory Visit

## 2023-09-10 DIAGNOSIS — Z944 Liver transplant status: Secondary | ICD-10-CM

## 2023-09-10 DIAGNOSIS — I10 Essential (primary) hypertension: Secondary | ICD-10-CM | POA: Diagnosis not present

## 2023-09-10 NOTE — Telephone Encounter (Signed)
 Patient came in this morning for lab draw.   Will forward to medical records to have labs sent to [facility] once resulted.

## 2023-09-11 DIAGNOSIS — I10 Essential (primary) hypertension: Secondary | ICD-10-CM | POA: Diagnosis not present

## 2023-09-11 LAB — COMPREHENSIVE METABOLIC PANEL WITH GFR
ALT: 13 IU/L (ref 0–32)
AST: 17 IU/L (ref 0–40)
Albumin: 3.8 g/dL — ABNORMAL LOW (ref 3.9–4.9)
Alkaline Phosphatase: 70 IU/L (ref 44–121)
BUN/Creatinine Ratio: 14 (ref 9–23)
BUN: 15 mg/dL (ref 6–24)
Bilirubin Total: <0.2 mg/dL (ref 0.0–1.2)
CO2: 22 mmol/L (ref 20–29)
Calcium: 9.2 mg/dL (ref 8.7–10.2)
Chloride: 105 mmol/L (ref 96–106)
Creatinine, Ser: 1.07 mg/dL — ABNORMAL HIGH (ref 0.57–1.00)
Globulin, Total: 3.2 g/dL (ref 1.5–4.5)
Glucose: 97 mg/dL (ref 70–99)
Potassium: 5 mmol/L (ref 3.5–5.2)
Sodium: 140 mmol/L (ref 134–144)
Total Protein: 7 g/dL (ref 6.0–8.5)
eGFR: 64 mL/min/{1.73_m2} (ref >59)

## 2023-09-12 ENCOUNTER — Ambulatory Visit: Payer: Self-pay

## 2023-09-12 ENCOUNTER — Ambulatory Visit: Admitting: Pharmacist

## 2023-09-12 ENCOUNTER — Encounter: Payer: Self-pay | Admitting: Pharmacist

## 2023-09-12 VITALS — BP 130/84 | HR 92 | Wt 225.0 lb

## 2023-09-12 DIAGNOSIS — I493 Ventricular premature depolarization: Secondary | ICD-10-CM | POA: Diagnosis not present

## 2023-09-12 DIAGNOSIS — E66813 Obesity, class 3: Secondary | ICD-10-CM | POA: Diagnosis not present

## 2023-09-12 DIAGNOSIS — I10 Essential (primary) hypertension: Secondary | ICD-10-CM | POA: Diagnosis not present

## 2023-09-12 DIAGNOSIS — R1310 Dysphagia, unspecified: Secondary | ICD-10-CM | POA: Diagnosis not present

## 2023-09-12 DIAGNOSIS — E875 Hyperkalemia: Secondary | ICD-10-CM | POA: Diagnosis not present

## 2023-09-12 DIAGNOSIS — F200 Paranoid schizophrenia: Secondary | ICD-10-CM | POA: Diagnosis not present

## 2023-09-12 DIAGNOSIS — G40909 Epilepsy, unspecified, not intractable, without status epilepticus: Secondary | ICD-10-CM | POA: Diagnosis not present

## 2023-09-12 DIAGNOSIS — Z79899 Other long term (current) drug therapy: Secondary | ICD-10-CM | POA: Diagnosis not present

## 2023-09-12 DIAGNOSIS — M1711 Unilateral primary osteoarthritis, right knee: Secondary | ICD-10-CM | POA: Diagnosis not present

## 2023-09-12 DIAGNOSIS — K219 Gastro-esophageal reflux disease without esophagitis: Secondary | ICD-10-CM | POA: Diagnosis not present

## 2023-09-12 DIAGNOSIS — J9601 Acute respiratory failure with hypoxia: Secondary | ICD-10-CM | POA: Diagnosis not present

## 2023-09-12 DIAGNOSIS — D631 Anemia in chronic kidney disease: Secondary | ICD-10-CM | POA: Diagnosis not present

## 2023-09-12 DIAGNOSIS — Z7951 Long term (current) use of inhaled steroids: Secondary | ICD-10-CM | POA: Diagnosis not present

## 2023-09-12 DIAGNOSIS — M797 Fibromyalgia: Secondary | ICD-10-CM | POA: Diagnosis not present

## 2023-09-12 DIAGNOSIS — J4541 Moderate persistent asthma with (acute) exacerbation: Secondary | ICD-10-CM | POA: Diagnosis not present

## 2023-09-12 DIAGNOSIS — Z556 Problems related to health literacy: Secondary | ICD-10-CM | POA: Diagnosis not present

## 2023-09-12 DIAGNOSIS — K529 Noninfective gastroenteritis and colitis, unspecified: Secondary | ICD-10-CM | POA: Diagnosis not present

## 2023-09-12 DIAGNOSIS — Z9181 History of falling: Secondary | ICD-10-CM | POA: Diagnosis not present

## 2023-09-12 DIAGNOSIS — J690 Pneumonitis due to inhalation of food and vomit: Secondary | ICD-10-CM | POA: Diagnosis not present

## 2023-09-12 DIAGNOSIS — N1832 Chronic kidney disease, stage 3b: Secondary | ICD-10-CM | POA: Diagnosis not present

## 2023-09-12 DIAGNOSIS — N390 Urinary tract infection, site not specified: Secondary | ICD-10-CM | POA: Diagnosis not present

## 2023-09-12 DIAGNOSIS — Z944 Liver transplant status: Secondary | ICD-10-CM | POA: Diagnosis not present

## 2023-09-12 DIAGNOSIS — F79 Unspecified intellectual disabilities: Secondary | ICD-10-CM

## 2023-09-12 DIAGNOSIS — E039 Hypothyroidism, unspecified: Secondary | ICD-10-CM | POA: Diagnosis not present

## 2023-09-12 NOTE — Patient Instructions (Signed)
 It was nice to see you today!      Use all THREE  boxes - All are full!      Medication Changes: None Please pickup refills for the following medications prior to your next visit/refill   Purchase New Glucose Meter battery - [phone]    Refills of medications need for next fill include:  Doxepin Famotidine Propranolol 10mg  Risperidone

## 2023-09-12 NOTE — Progress Notes (Signed)
 Reviewed and agree with Dr Macky Lower plan.

## 2023-09-12 NOTE — Progress Notes (Signed)
    S:     Chief Complaint  Patient presents with   Medication Management    Medication Refill - Update     48 y.o. who presents for medication management due to concern of polypharmacy and need for medication adherence monitoring and assistance.   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 Rochelle Chu on 08/27/2023.  Recently visited ED for concern of elevated creatinine which resolved.    Patient arrives in good spirits and presents with assistance of walker. During this visit we attempted to fill 3 week supply   Insurance Coverage: Medicaid  Do you know what each of your medicines is for? yes Do you feel that your medications are working for you? yes Have you been experiencing any side effects to the medications prescribed? no   O:  Physical Exam Vitals reviewed.  Constitutional:      Appearance: Normal appearance.  Pulmonary:     Effort: Pulmonary effort is normal.  Neurological:     Mental Status: She is alert.  Psychiatric:        Mood and Affect: Mood normal.        Thought Content: Thought content normal.    Review of Systems  Respiratory:  Negative for shortness of breath and wheezing.   All other systems reviewed and are negative.   Vitals:   09/12/23 1401  BP: 130/84  Pulse: 92  SpO2: 100%     A/P: Intellectual disability history and polypharmacy - Polypharmacy - Medication Reconciliation - Medication list reviewed and updated.   Understanding of regimen: good - verbally communicated all but one dosing regimen instructions during pill box fill.  Understanding of indications: fair  Potential of compliance: good with use of pill box.  Patient has known adherence challenges including lack of knowledge, vision loss.   - The following issues were noted missed single dose of risperidone.  THREE weeks of all oral medications filled into pill boxes.  Patient will  return in 2.5 weeks on Tuesday (patient preference of day)  for refill of up to 3 weeks supply.    Inserted battery with power into home glucose monitor and corrected date/time.  Recommended patient purchase new glucose meter battery - [phone]  - Asked patient to resume testing glucose readings.    Written patient instructions and updated medication list provided.  Refills of medications need for next fill include: Doxepin Famotidine Propranolol 10mg  Risperidone  Total time in face to face counseling 33 minutes.    Follow up Pharmacist 2.5 weeks PCP Clinic Visit TBD Patient seen with Teofilo Fellers, PharmD Candidate and Jeanine Millers, PharmD Candidate.

## 2023-09-12 NOTE — Assessment & Plan Note (Signed)
 Intellectual disability history and polypharmacy - Polypharmacy - Medication Reconciliation - Medication list reviewed and updated.   Understanding of regimen: good - verbally communicated all but one dosing regimen instructions during pill box fill.  Understanding of indications: fair  Potential of compliance: good with use of pill box.  Patient has known adherence challenges including lack of knowledge, vision loss.   - The following issues were noted missed single dose of risperidone.  THREE weeks of all oral medications filled into pill boxes.  Patient will return in 2.5 weeks on Tuesday (patient preference of day)  for refill of up to 3 weeks supply.

## 2023-09-13 DIAGNOSIS — I10 Essential (primary) hypertension: Secondary | ICD-10-CM | POA: Diagnosis not present

## 2023-09-13 LAB — CBC
Hematocrit: 30.7 % — ABNORMAL LOW (ref 34.0–46.6)
Hemoglobin: 9.8 g/dL — ABNORMAL LOW (ref 11.1–15.9)
MCH: 28.4 pg (ref 26.6–33.0)
MCHC: 31.9 g/dL (ref 31.5–35.7)
MCV: 89 fL (ref 79–97)
Platelets: 167 10*3/uL (ref 150–450)
RBC: 3.45 x10E6/uL — ABNORMAL LOW (ref 3.77–5.28)
RDW: 15 % (ref 11.7–15.4)
WBC: 3.2 10*3/uL — ABNORMAL LOW (ref 3.4–10.8)

## 2023-09-13 LAB — CYCLOSPORINE: Cyclosporine, LC-MS/MS: 99 ng/mL — ABNORMAL LOW (ref 100–400)

## 2023-09-14 DIAGNOSIS — I10 Essential (primary) hypertension: Secondary | ICD-10-CM | POA: Diagnosis not present

## 2023-09-15 DIAGNOSIS — I10 Essential (primary) hypertension: Secondary | ICD-10-CM | POA: Diagnosis not present

## 2023-09-16 ENCOUNTER — Encounter: Payer: Self-pay | Admitting: Student

## 2023-09-16 ENCOUNTER — Ambulatory Visit: Admitting: Student

## 2023-09-16 VITALS — BP 130/75 | HR 97 | Ht 59.0 in | Wt 228.6 lb

## 2023-09-16 DIAGNOSIS — I10 Essential (primary) hypertension: Secondary | ICD-10-CM | POA: Diagnosis not present

## 2023-09-16 DIAGNOSIS — N950 Postmenopausal bleeding: Secondary | ICD-10-CM | POA: Diagnosis not present

## 2023-09-16 DIAGNOSIS — R051 Acute cough: Secondary | ICD-10-CM | POA: Diagnosis not present

## 2023-09-16 DIAGNOSIS — H9192 Unspecified hearing loss, left ear: Secondary | ICD-10-CM | POA: Diagnosis not present

## 2023-09-16 HISTORY — DX: Acute cough: R05.1

## 2023-09-16 NOTE — Progress Notes (Signed)
    SUBJECTIVE:   CHIEF COMPLAINT / HPI:   The patient, with a history of asthma and recent sinusitis and ear infection, presents with ongoing issues with her left ear. She describes a sensation of fullness or blockage and decreased hearing, which has persisted despite a course of antibiotics. The patient reports that the sinus symptoms resolved with the antibiotic treatment, but the ear issues did not.  In addition to the ear issues, the patient has been experiencing a deep cough, particularly at night, for the past week. She describes the cough as similar to when she had pneumonia. She also reports excessive sweating but denies fever or body aches, vomiting or diarrhea. The patient's energy level is reported as normal.  The patient is also scheduled for a D&C procedure due to postmenopausal bleeding. She has had previous surgeries and has not had adverse reactions to anesthesia. She denies any history of bleeding disorders or blood clots.   PERTINENT  PMH / PSH: Asthma, liver transplant recipient, postmenopausal bleeding  OBJECTIVE:   BP 130/75   Pulse 97   Ht 4\' 11"  (1.499 m)   Wt 228 lb 9.6 oz (103.7 kg)   LMP 12/02/2022   SpO2 100%   BMI 46.17 kg/m    General: NAD, pleasant, well-appearing HEENT: White sclera, clear conjunctiva, unable to view right TM due to cerumen, left TM very dull, slightly erythematous, no cone of light, nonbulging.  No TTP of left mastoid and skin overlying mastoid is without erythema or edema.  No submental, cervical lymphadenopathy.  No erythema or exudate of oropharynx. Cardiac: RRR, no murmurs. Respiratory: CTAB, normal effort, No wheezes, rales or rhonchi.  No coughing during visit Skin: warm and dry   ASSESSMENT/PLAN:   Postmenopausal bleeding Has D&C scheduled for 10/08/2023 with OB/GYN. Has EKG, CMP, and CBC ordered for pre op No meds need to be held No previous rxn to anesthesia  No bleeding disorder  Will require short course of  prednisone  leading up to the procedure in a few days after the procedure given her significant asthma history, appointment scheduled for 10/01/2023 to check in and provide Rx prednisone   Hearing difficulty of left ear Persistent left ear hearing issues post-otitis media. Tympanic membrane dull and erythematous. Could be related to previous infection. Also considered eustachian tube dysfunction. ENT referral for further evaluation   Acute cough Recent productive cough worse at night without wheezing, body aches fever. Possibly due to postnasal drip, allergies,  or viral URI.  Low concern for pneumonia at this time.  She is well-appearing with stable vitals and lungs are clear. - Continue current inhaler regimen and daily Flonase . - Return if symptoms persist or worsen, f/u apt scheduled for 5/6     Dr. Glenn Lange, DO [facility]

## 2023-09-16 NOTE — Assessment & Plan Note (Signed)
 Recent productive cough worse at night without wheezing, body aches fever. Possibly due to postnasal drip, allergies,  or viral URI.  Low concern for pneumonia at this time.  She is well-appearing with stable vitals and lungs are clear. - Continue current inhaler regimen and daily Flonase . - Return if symptoms persist or worsen, f/u apt scheduled for 5/6

## 2023-09-16 NOTE — Assessment & Plan Note (Signed)
 Persistent left ear hearing issues post-otitis media. Tympanic membrane dull and erythematous. Could be related to previous infection. Also considered eustachian tube dysfunction. ENT referral for further evaluation

## 2023-09-16 NOTE — Assessment & Plan Note (Signed)
 Has D&C scheduled for 10/08/2023 with OB/GYN. Has EKG, CMP, and CBC ordered for pre op No meds need to be held No previous rxn to anesthesia  No bleeding disorder  Will require short course of prednisone  leading up to the procedure in a few days after the procedure given her significant asthma history, appointment scheduled for 10/01/2023 to check in and provide Rx prednisone 

## 2023-09-16 NOTE — Patient Instructions (Signed)
 It was great to see you! Thank you for allowing me to participate in your care!    Our plans for today:  - If your cough worsens, you develop a fever and feel unwell, please return - Return on 10/01/2023 to prepare for your procedure with OB/GYN and get prescription for short course of steroids - I referred you to ENT for your hearing loss, they will call to schedule an appointment   Take care and seek immediate care sooner if you develop any concerns.   Dr. Glenn Lange, DO [facility]

## 2023-09-17 DIAGNOSIS — I10 Essential (primary) hypertension: Secondary | ICD-10-CM | POA: Diagnosis not present

## 2023-09-18 DIAGNOSIS — I10 Essential (primary) hypertension: Secondary | ICD-10-CM | POA: Diagnosis not present

## 2023-09-19 ENCOUNTER — Ambulatory Visit: Payer: Self-pay

## 2023-09-19 DIAGNOSIS — Z7951 Long term (current) use of inhaled steroids: Secondary | ICD-10-CM | POA: Diagnosis not present

## 2023-09-19 DIAGNOSIS — F200 Paranoid schizophrenia: Secondary | ICD-10-CM | POA: Diagnosis not present

## 2023-09-19 DIAGNOSIS — E039 Hypothyroidism, unspecified: Secondary | ICD-10-CM | POA: Diagnosis not present

## 2023-09-19 DIAGNOSIS — R1310 Dysphagia, unspecified: Secondary | ICD-10-CM | POA: Diagnosis not present

## 2023-09-19 DIAGNOSIS — K529 Noninfective gastroenteritis and colitis, unspecified: Secondary | ICD-10-CM | POA: Diagnosis not present

## 2023-09-19 DIAGNOSIS — M797 Fibromyalgia: Secondary | ICD-10-CM | POA: Diagnosis not present

## 2023-09-19 DIAGNOSIS — J690 Pneumonitis due to inhalation of food and vomit: Secondary | ICD-10-CM | POA: Diagnosis not present

## 2023-09-19 DIAGNOSIS — Z944 Liver transplant status: Secondary | ICD-10-CM | POA: Diagnosis not present

## 2023-09-19 DIAGNOSIS — I493 Ventricular premature depolarization: Secondary | ICD-10-CM | POA: Diagnosis not present

## 2023-09-19 DIAGNOSIS — N390 Urinary tract infection, site not specified: Secondary | ICD-10-CM | POA: Diagnosis not present

## 2023-09-19 DIAGNOSIS — J9601 Acute respiratory failure with hypoxia: Secondary | ICD-10-CM | POA: Diagnosis not present

## 2023-09-19 DIAGNOSIS — J4541 Moderate persistent asthma with (acute) exacerbation: Secondary | ICD-10-CM | POA: Diagnosis not present

## 2023-09-19 DIAGNOSIS — K219 Gastro-esophageal reflux disease without esophagitis: Secondary | ICD-10-CM | POA: Diagnosis not present

## 2023-09-19 DIAGNOSIS — E875 Hyperkalemia: Secondary | ICD-10-CM | POA: Diagnosis not present

## 2023-09-19 DIAGNOSIS — G40909 Epilepsy, unspecified, not intractable, without status epilepticus: Secondary | ICD-10-CM | POA: Diagnosis not present

## 2023-09-19 DIAGNOSIS — Z9181 History of falling: Secondary | ICD-10-CM | POA: Diagnosis not present

## 2023-09-19 DIAGNOSIS — I10 Essential (primary) hypertension: Secondary | ICD-10-CM | POA: Diagnosis not present

## 2023-09-19 DIAGNOSIS — D631 Anemia in chronic kidney disease: Secondary | ICD-10-CM | POA: Diagnosis not present

## 2023-09-19 DIAGNOSIS — M1711 Unilateral primary osteoarthritis, right knee: Secondary | ICD-10-CM | POA: Diagnosis not present

## 2023-09-19 DIAGNOSIS — E66813 Obesity, class 3: Secondary | ICD-10-CM | POA: Diagnosis not present

## 2023-09-19 DIAGNOSIS — Z556 Problems related to health literacy: Secondary | ICD-10-CM | POA: Diagnosis not present

## 2023-09-19 DIAGNOSIS — Z79899 Other long term (current) drug therapy: Secondary | ICD-10-CM | POA: Diagnosis not present

## 2023-09-19 DIAGNOSIS — N1832 Chronic kidney disease, stage 3b: Secondary | ICD-10-CM | POA: Diagnosis not present

## 2023-09-19 NOTE — Patient Outreach (Signed)
 Complex Care Management   Visit Note  09/19/2023  Name:  [name] MRN: [id number] DOB: 29-[name]-1976  Situation: Referral received for Complex Care Management related to  Vaginal Bleeding  I obtained verbal consent from Patient.  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 outlaw   History of Bell's palsy 11/2018   left side   History of gastric ulcer    IDA (iron  deficiency anemia)    Immunosuppression due to drug therapy (HCC)    Inflammatory dermatosis    hands   Intellectual disability    developmental delay   Liver transplant recipient [facility]) 06/2013   followed by [name]--- Dr Stoney Elks;   for ESLD secondary PBC   Morbid obesity (HCC) 07/25/2006   Qualifier: Diagnosis of   By: Larry Poag MD, JOSEPH         OA (osteoarthritis)    OSA (obstructive sleep apnea)    study many years ago,  no cpap  (study available)   Perennial allergic rhinitis    PMB (postmenopausal bleeding)    PONV (postoperative nausea and vomiting)    Primary biliary cirrhosis (HCC) 2012   s/p  liver transplant in 2015   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   Schizoaffective disorder, bipolar type (HCC)    Seasonal allergic rhinitis due to pollen    Seizure disorder (HCC)    congenital epilepsy;   neuorologist--- dr Gracie Lav   Sickle cell trait (HCC)    Vitamin D  deficiency     Assessment: Patient Reported Symptoms:  Cognitive Cognitive Status: Able to follow simple commands, Alert and oriented to person, place, and time, Normal speech and language  skills      Neurological Neurological Review of Symptoms: No symptoms reported    HEENT HEENT Symptoms Reported: Ear dryness HEENT Management Strategies: Medication therapy HEENT Self-Management Outcome: 4 (good)    Cardiovascular Cardiovascular Symptoms Reported: No symptoms reported    Respiratory Respiratory Symptoms Reported: Shortness of breath Respiratory Conditions: Asthma Respiratory Comment: uses medication and inhalers  Endocrine Is patient diabetic?: Yes Is patient checking blood sugars at home?: Yes Endocrine Conditions: Diabetes Endocrine Management Strategies: Medication therapy Endocrine Self-Management Outcome: 4 (good)  Gastrointestinal Gastrointestinal Symptoms Reported: Constipation Gastrointestinal Conditions: Constipation Gastrointestinal Management Strategies: Medication therapy    Genitourinary Genitourinary Symptoms Reported: No symptoms reported    Integumentary Integumentary Symptoms Reported: No symptoms reported    Musculoskeletal Musculoskelatal Symptoms Reviewed: Weakness Musculoskeletal Conditions: Mobility limited, Unsteady gait Musculoskeletal Management Strategies: Exercise (physical therapy) Falls in the past year?: No Number of falls in past year: 1 or less Was there an injury with Fall?: No Fall Risk Category Calculator: 0 Patient Fall Risk Level: Low Fall Risk Patient at Risk for Falls Due to: Impaired balance/gait (patient states that she had pneumonia) Fall risk Follow up: Falls evaluation completed, Education provided, Falls prevention discussed  Psychosocial Psychosocial Symptoms Reported: No symptoms reported            09/19/2023    1:36 PM  Depression screen PHQ 2/9  Decreased Interest 0  Down, Depressed, Hopeless 0  PHQ - 2 Score 0    There were no vitals filed for this visit.  Medications Reviewed Today     Reviewed by Augustin Leber, RN (Registered Nurse) on 09/19/23 at 1322  Med List Status: <None>   Medication  Order Taking? Sig Documenting Provider Last Dose Status Informant  albuterol  (PROVENTIL ) (2.5 MG/3ML) 0.083% nebulizer solution [id number] Yes Take 3 mLs (2.5 mg total) by nebulization every 4 (four) hours as needed for wheezing or shortness of breath. 1 vial via nebulizer every 4-6 hours as needed. Kozlow, Rema Care, MD Taking Active Self, Pharmacy Records  ammonium lactate  [facility]) 12 % cream [id number] Yes Apply 1 Application topically 2 (two) times daily. [provider] Taking Active Self, Pharmacy Records  azelastine  (ASTELIN ) 0.1 % nasal spray [id number] Yes Place 2 sprays into both nostrils 2 (two) times daily. Kozlow, Rema Care, MD Taking Active Self, Pharmacy Records  benztropine  (COGENTIN ) 1 MG tablet [id number] Yes Take 1 tablet (1 mg total) by mouth 2 (two) times daily. McDiarmid, Demetra Filter, MD Taking Active Self, Pharmacy Records  budesonide  (PULMICORT ) 0.5 MG/2ML nebulizer solution [id number] Yes Take 2 mLs (0.5 mg total) by nebulization every 4 (four) hours as needed. Kozlow, Rema Care, MD Taking Active Self, Pharmacy Records           Med Note [place] Pierre   Thu Jul 18, 2023 12:13 PM)    budesonide -formoterol  (SYMBICORT ) 160-4.5 MCG/ACT inhaler [id number] Yes Inhale 2 puffs into the lungs 2 (two) times daily. Use with spacer and mask. Kozlow, Rema Care, MD Taking Active Self, Pharmacy Records  carboxymethylcellulose (REFRESH PLUS) 0.5 % SOLN [id number] Yes Place 1 drop into both eyes 4 (four) times daily as needed (for dry eyes). [provider] Taking Active Self, Pharmacy Records  cetirizine  (ZYRTEC ) 10 MG tablet [id number] Yes Take 1 tablet (10 mg total) by mouth daily as needed for allergies (Can use an extra dose during flares).  Patient taking differently: Take 10 mg by mouth once a week.   Kozlow, Eric J, MD Taking Active Self, Pharmacy Records  cycloSPORINE  modified 100 MG CAPS [id number] Yes Take 100 mg by mouth 2 (two) times daily. [provider] Taking Active Self,  Pharmacy Records           Med Note [facility] Pierre   Thu Jul 18, 2023 12:17 PM)    divalproex  (DEPAKOTE  ER) 500 MG 24 hr tablet [id number] Yes Take 2 tablets (1,000 mg total) by mouth at bedtime. Phebe Brasil, MD Taking Active   doxepin  (SINEQUAN ) 25 MG capsule [id number] Yes Take 1 capsule (25 mg total) by mouth at bedtime. McDiarmid, Demetra Filter, MD Taking Active   famotidine  (PEPCID ) 20 MG tablet [id number] Yes Take 1 tablet (20 mg total) by mouth 2 (two) times daily. McDiarmid, Demetra Filter, MD Taking Active   ferrous sulfate  325 (65 FE) MG EC tablet [id number] Yes Take 1 tablet (325 mg total) by mouth daily. McDiarmid, Demetra Filter, MD Taking Active   fluticasone  (FLONASE ) 50 MCG/ACT nasal spray [id number] Yes Place 1 spray into both nostrils in the morning and at bedtime. Kozlow, Rema Care, MD Taking Active Self, Pharmacy Records           Med Note Alice Innocent, Tonye Free Apr 18, 2023  1:59 PM) Taking 2 sprays BID  fluticasone  (FLOVENT  HFA) 110 MCG/ACT inhaler [id number] Yes Inhale 2 puffs into the lungs 2 (two) times  daily. Only during asthma flare ups Kozlow, Rema Care, MD Taking Active Self, Pharmacy Records  glucose blood (ACCU-CHEK GUIDE TEST) test strip [id number] Yes Use as instructed McDiarmid, Demetra Filter, MD Taking Active   levothyroxine  (SYNTHROID ) 75 MCG tablet [id number] Yes TAKE 1 TABLET(75 MCG) BY MOUTH EVERY MORNING 30 MINUTES BEFORE FOOD Glenn Lange, DO Taking Active Self, Pharmacy Records  lidocaine  (HM LIDOCAINE  PATCH) 4 % [id number]  Place 1 patch onto the skin daily.  Patient not taking: Reported on 09/12/2023   Glenn Lange, DO  Active Self, Pharmacy Records  mometasone  (ELOCON ) 0.1 % cream [id number] No Apply topically 2 (two) times daily as needed. Can use on skin one to two times daily if needed.  Patient not taking: Reported on 09/19/2023   Kozlow, Eric J, MD Not Taking Active Self, Pharmacy Records  montelukast  (SINGULAIR ) 10 MG tablet [id number] Yes Take 1 tablet (10 mg total) by mouth at bedtime.  Kozlow, Rema Care, MD Taking Active Self, Pharmacy Records  Multiple Vitamin (MULTIVITAMIN) tablet [id number] Yes Take 2 tablets by mouth daily. 9 am. Lissa Riding, NP Taking Active Self, Pharmacy Records  Nebulizers MISC [id number] Yes 1 kit by Does not apply route as directed. Kozlow, Rema Care, MD Taking Active Self, Pharmacy Records  omeprazole  [facility]) 40 MG capsule [id number] Yes TAKE 1 CAPSULE(40 MG) BY MOUTH TWICE DAILY Kozlow, Eric J, MD Taking Active Self, Pharmacy Records  polyethylene glycol (MIRALAX  / GLYCOLAX ) packet [id number]  Take 17 g by mouth daily as needed (for constipation). In water or juice [provider]  Active Self, Pharmacy Records           Med Note Alice Innocent, Tonye Free [name] 21, 2023  2:52 PM) Using twice daily  prazosin  (MINIPRESS ) 2 MG capsule [id number] Yes Take 1 capsule (2 mg total) by mouth at bedtime. McDiarmid, Demetra Filter, MD Taking Active Self, Pharmacy Records  propranolol  (INDERAL ) 10 MG tablet [id number] Yes Take 1 tablet (10 mg total) by mouth 2 (two) times daily. McDiarmid, Demetra Filter, MD Taking Active   RA VITAMIN D -3 50 MCG (2000 UT) CAPS [id number] Yes Take 1 capsule (2,000 Units total) by mouth daily. McDiarmid, Demetra Filter, MD Taking Active Self, Pharmacy Records           Med Note Alice Innocent, Arthur Billing   Thu Aug 08, 2023  2:28 PM) Taking 1000 internation units daily in the AM  Respiratory Therapy Supplies (NEBULIZER/TUBING/MOUTHPIECE) KIT [id number] Yes 2 each by Does not apply route as directed. Kozlow, Rema Care, MD Taking Active Self, Pharmacy Records  risperiDONE  (RISPERDAL ) 3 MG tablet [id number] Yes Take 0.5-1 tablets (1.5-3 mg total) by mouth 2 (two) times daily. 1/2 of the 3mg  tablet in AM and one 3mg  tablet in the PM McDiarmid, Demetra Filter, MD Taking Active Self, Pharmacy Records  Spacer/Aero-Hold Chamber Mask MISC [id number] Yes Use as directed with inhaler. Kozlow, Rema Care, MD Taking Active Self, Pharmacy Records  Tiotropium Bromide Monohydrate  (SPIRIVA  RESPIMAT) 1.25  MCG/ACT AERS [id number] Yes Inhale 2 puffs into the lungs in the morning. Kozlow, Eric J, MD Taking Active Self, Pharmacy Records  VENTOLIN  HFA [phone]) MCG/ACT inhaler [id number] Yes Inhale 2 puffs into the lungs every 4 (four) hours as needed for wheezing or shortness of breath. Kozlow, Rema Care, MD Taking Active Self, Pharmacy Records  Wheat Dextrin [facility] DRINK MIX PO) [id number] Yes Take 1 Scoop by mouth 3 (three) times a week. [provider] Taking Active Self, Pharmacy Records  Recommendation:   Follow up with Obgyn specialist and procedure on 10/08/23 at 330 pm  Follow Up Plan:  10/23/23  130 pm   Augustin Leber RN, BSN, [facility]  [facility], [facility]  Care Coordinator Phone: [phone]

## 2023-09-19 NOTE — Patient Instructions (Signed)
 Visit Information  [name] was given information about Medicaid Managed Care team care coordination services as a part of their Healthy [facility] Medicaid benefit. [name] verbally consented to engagement with the [facility] Managed Care team.   If you are experiencing a medical emergency, please call 911 or report to your local emergency department or urgent care.   If you have a non-emergency medical problem during routine business hours, please contact your provider's office and ask to speak with a nurse.   For questions related to your Healthy [facility] health plan, please call: [phone] or visit the homepage here: [email or web address]  If you would like to schedule transportation through your Healthy [facility] plan, please call the following number at least 2 days in advance of your appointment: [phone]  For information about your ride after you set it up, call Ride Assist at [phone]. Use this number to activate a Will Call pickup, or if your transportation is late for a scheduled pickup. Use this number, too, if you need to make a change or cancel a previously scheduled reservation.  If you need transportation services right away, call [phone]. The after-hours call center is staffed 24 hours to handle ride assistance and urgent reservation requests (including discharges) 365 days a year. Urgent trips include sick visits, hospital discharge requests and life-sustaining treatment.  Call the [facility] Line at [phone], at any time, 24 hours a day, 7 days a week. If you are in danger or need immediate medical attention call 911.  If you would like help to quit smoking, call 1-800-QUIT-NOW (845 523 8578) OR Espaol: 1-855-Djelo-Ya (8-756-433-2951) o para ms informacin haga clic aqu or Text READY to 884-166 to register via text  [name] - following are the goals we discussed in your visit today:    Goals Addressed             This Visit's Progress    VBCI RN Care Plan       Problems:  Knowledge Deficits related to plan of care for management of GYN Issue vaginal bleeding   Goal: Over the next 60 days the Patient will attend all scheduled medical appointments: with PCP and specialist as evidenced by keeping all scheduled appointments        demonstrate Ongoing adherence to prescribed treatment plan for GYN Issue vaginal bleeding as evidenced by no admissions to the hospital verbalize basic understanding of GYN Issue vaginal bleeding  disease process and self health management plan as evidenced by verbal explanation lifestyle changes and consistent medication compliance   Interventions: Evaluation of current treatment plan related to  self management and patient's adherence to plan as established by provider     GYN Issue  (Status:  New goal.)  Long Term Goal Evaluation of current treatment plan related to  Abnormal Vaginal Bleeding ,  self-management and patient's adherence to plan as established by provider. Discussed plans with patient for ongoing care management follow up and provided patient with direct contact information for care management team Collaborated with GYN provider regarding patient needing to reschedule Endometrial biopsy performed in the OR Assessed social determinant of health barriers Provided education regarding abnormal vaginal bleeding, ensured patient has not bleeding at this time     Patient Goals/Self-Care Activities: Take all medications as prescribed Attend all scheduled provider appointments Attend church or other social activities Call provider office for new concerns or questions  Reschedule missed appointments   Follow Up Plan:  Telephone follow up appointment with  care management team member scheduled for:  10/23/23  130 pm             Please see education materials related to Vaginal Bleeding  provided by MyChart link.  Patient  verbalizes understanding of instructions and care plan provided today and agrees to view in MyChart. Active MyChart status and patient understanding of how to access instructions and care plan via MyChart confirmed with patient.     Follow Up Plan: RNCM will follow up 10/23/23  130 pm   Augustin Leber RN, BSN, [name]  [facility], [facility]  Care Coordinator Phone: [phone]      Following is a copy of your plan of care:  There are no care plans that you recently modified to display for this patient.

## 2023-09-20 DIAGNOSIS — I10 Essential (primary) hypertension: Secondary | ICD-10-CM | POA: Diagnosis not present

## 2023-09-21 DIAGNOSIS — I10 Essential (primary) hypertension: Secondary | ICD-10-CM | POA: Diagnosis not present

## 2023-09-22 DIAGNOSIS — I10 Essential (primary) hypertension: Secondary | ICD-10-CM | POA: Diagnosis not present

## 2023-09-23 DIAGNOSIS — I10 Essential (primary) hypertension: Secondary | ICD-10-CM | POA: Diagnosis not present

## 2023-09-24 DIAGNOSIS — J9601 Acute respiratory failure with hypoxia: Secondary | ICD-10-CM | POA: Diagnosis not present

## 2023-09-24 DIAGNOSIS — E66813 Obesity, class 3: Secondary | ICD-10-CM | POA: Diagnosis not present

## 2023-09-24 DIAGNOSIS — J4541 Moderate persistent asthma with (acute) exacerbation: Secondary | ICD-10-CM | POA: Diagnosis not present

## 2023-09-24 DIAGNOSIS — Z556 Problems related to health literacy: Secondary | ICD-10-CM | POA: Diagnosis not present

## 2023-09-24 DIAGNOSIS — I10 Essential (primary) hypertension: Secondary | ICD-10-CM | POA: Diagnosis not present

## 2023-09-24 DIAGNOSIS — R1310 Dysphagia, unspecified: Secondary | ICD-10-CM | POA: Diagnosis not present

## 2023-09-24 DIAGNOSIS — Z9181 History of falling: Secondary | ICD-10-CM | POA: Diagnosis not present

## 2023-09-24 DIAGNOSIS — Z7951 Long term (current) use of inhaled steroids: Secondary | ICD-10-CM | POA: Diagnosis not present

## 2023-09-24 DIAGNOSIS — Z944 Liver transplant status: Secondary | ICD-10-CM | POA: Diagnosis not present

## 2023-09-24 DIAGNOSIS — D631 Anemia in chronic kidney disease: Secondary | ICD-10-CM | POA: Diagnosis not present

## 2023-09-24 DIAGNOSIS — M797 Fibromyalgia: Secondary | ICD-10-CM | POA: Diagnosis not present

## 2023-09-24 DIAGNOSIS — M1711 Unilateral primary osteoarthritis, right knee: Secondary | ICD-10-CM | POA: Diagnosis not present

## 2023-09-24 DIAGNOSIS — N1832 Chronic kidney disease, stage 3b: Secondary | ICD-10-CM | POA: Diagnosis not present

## 2023-09-24 DIAGNOSIS — F200 Paranoid schizophrenia: Secondary | ICD-10-CM | POA: Diagnosis not present

## 2023-09-24 DIAGNOSIS — K529 Noninfective gastroenteritis and colitis, unspecified: Secondary | ICD-10-CM | POA: Diagnosis not present

## 2023-09-24 DIAGNOSIS — K219 Gastro-esophageal reflux disease without esophagitis: Secondary | ICD-10-CM | POA: Diagnosis not present

## 2023-09-24 DIAGNOSIS — G40909 Epilepsy, unspecified, not intractable, without status epilepticus: Secondary | ICD-10-CM | POA: Diagnosis not present

## 2023-09-24 DIAGNOSIS — J690 Pneumonitis due to inhalation of food and vomit: Secondary | ICD-10-CM | POA: Diagnosis not present

## 2023-09-24 DIAGNOSIS — E875 Hyperkalemia: Secondary | ICD-10-CM | POA: Diagnosis not present

## 2023-09-24 DIAGNOSIS — I493 Ventricular premature depolarization: Secondary | ICD-10-CM | POA: Diagnosis not present

## 2023-09-24 DIAGNOSIS — E039 Hypothyroidism, unspecified: Secondary | ICD-10-CM | POA: Diagnosis not present

## 2023-09-24 DIAGNOSIS — N390 Urinary tract infection, site not specified: Secondary | ICD-10-CM | POA: Diagnosis not present

## 2023-09-24 DIAGNOSIS — Z79899 Other long term (current) drug therapy: Secondary | ICD-10-CM | POA: Diagnosis not present

## 2023-09-25 DIAGNOSIS — I10 Essential (primary) hypertension: Secondary | ICD-10-CM | POA: Diagnosis not present

## 2023-09-26 DIAGNOSIS — I10 Essential (primary) hypertension: Secondary | ICD-10-CM | POA: Diagnosis not present

## 2023-09-27 DIAGNOSIS — I10 Essential (primary) hypertension: Secondary | ICD-10-CM | POA: Diagnosis not present

## 2023-09-27 DIAGNOSIS — Z944 Liver transplant status: Secondary | ICD-10-CM | POA: Diagnosis not present

## 2023-09-27 DIAGNOSIS — K5900 Constipation, unspecified: Secondary | ICD-10-CM | POA: Diagnosis not present

## 2023-09-27 DIAGNOSIS — Z1211 Encounter for screening for malignant neoplasm of colon: Secondary | ICD-10-CM | POA: Diagnosis not present

## 2023-09-28 DIAGNOSIS — I10 Essential (primary) hypertension: Secondary | ICD-10-CM | POA: Diagnosis not present

## 2023-09-29 DIAGNOSIS — I10 Essential (primary) hypertension: Secondary | ICD-10-CM | POA: Diagnosis not present

## 2023-09-30 DIAGNOSIS — I10 Essential (primary) hypertension: Secondary | ICD-10-CM | POA: Diagnosis not present

## 2023-10-01 ENCOUNTER — Ambulatory Visit: Payer: Self-pay | Admitting: Student

## 2023-10-01 ENCOUNTER — Ambulatory Visit: Admitting: Pharmacist

## 2023-10-01 ENCOUNTER — Ambulatory Visit: Payer: Medicaid Other | Admitting: Allergy and Immunology

## 2023-10-01 ENCOUNTER — Emergency Department (HOSPITAL_COMMUNITY)

## 2023-10-01 ENCOUNTER — Emergency Department (HOSPITAL_COMMUNITY): Admission: EM | Admit: 2023-10-01 | Discharge: 2023-10-01 | Disposition: A | Discharge status: Home / Self Care

## 2023-10-01 ENCOUNTER — Encounter: Payer: Self-pay | Admitting: Student

## 2023-10-01 VITALS — BP 88/56 | HR 84 | Ht 59.0 in | Wt 230.2 lb

## 2023-10-01 DIAGNOSIS — I959 Hypotension, unspecified: Secondary | ICD-10-CM

## 2023-10-01 DIAGNOSIS — R918 Other nonspecific abnormal finding of lung field: Secondary | ICD-10-CM | POA: Diagnosis not present

## 2023-10-01 DIAGNOSIS — R079 Chest pain, unspecified: Secondary | ICD-10-CM | POA: Diagnosis not present

## 2023-10-01 DIAGNOSIS — I9589 Other hypotension: Secondary | ICD-10-CM | POA: Insufficient documentation

## 2023-10-01 DIAGNOSIS — Z91040 Latex allergy status: Secondary | ICD-10-CM | POA: Diagnosis not present

## 2023-10-01 DIAGNOSIS — M1711 Unilateral primary osteoarthritis, right knee: Secondary | ICD-10-CM

## 2023-10-01 DIAGNOSIS — R000 Tachycardia, unspecified: Secondary | ICD-10-CM | POA: Diagnosis not present

## 2023-10-01 HISTORY — DX: Hypotension, unspecified: I95.9

## 2023-10-01 HISTORY — DX: Chest pain, unspecified: R07.9

## 2023-10-01 LAB — CBC
HCT: 33.8 % — ABNORMAL LOW (ref 36.0–46.0)
Hemoglobin: 10.7 g/dL — ABNORMAL LOW (ref 12.0–15.0)
MCH: 28.4 pg (ref 26.0–34.0)
MCHC: 31.7 g/dL (ref 30.0–36.0)
MCV: 89.7 fL (ref 80.0–100.0)
Platelets: 167 10*3/uL (ref 150–400)
RBC: 3.77 MIL/uL — ABNORMAL LOW (ref 3.87–5.11)
RDW: 13.7 % (ref 11.5–15.5)
WBC: 3.3 10*3/uL — ABNORMAL LOW (ref 4.0–10.5)
nRBC: 0 % (ref 0.0–0.2)

## 2023-10-01 LAB — COMPREHENSIVE METABOLIC PANEL WITH GFR
ALT: 15 U/L (ref 0–44)
AST: 19 U/L (ref 15–41)
Albumin: 3.4 g/dL — ABNORMAL LOW (ref 3.5–5.0)
Alkaline Phosphatase: 50 U/L (ref 38–126)
Anion gap: 10 (ref 5–15)
BUN: 19 mg/dL (ref 6–20)
CO2: 22 mmol/L (ref 22–32)
Calcium: 9 mg/dL (ref 8.9–10.3)
Chloride: 106 mmol/L (ref 98–111)
Creatinine, Ser: 1.04 mg/dL — ABNORMAL HIGH (ref 0.44–1.00)
GFR, Estimated: >60 mL/min (ref >60)
Glucose, Bld: 113 mg/dL — ABNORMAL HIGH (ref 70–99)
Potassium: 4.6 mmol/L (ref 3.5–5.1)
Sodium: 138 mmol/L (ref 135–145)
Total Bilirubin: 0.4 mg/dL (ref 0.0–1.2)
Total Protein: 7.5 g/dL (ref 6.5–8.1)

## 2023-10-01 LAB — TROPONIN I (HIGH SENSITIVITY)
Troponin I (High Sensitivity): 3 ng/L (ref <18)
Troponin I (High Sensitivity): 3 ng/L (ref <18)

## 2023-10-01 NOTE — Discharge Instructions (Signed)
 Please follow-up with your primary doctor for further testing and possible Holter monitor.  Return if develop fevers, chills, chest pain, palpitation, lightheadedness, passout or any new or worsening symptoms that are concerning to you.

## 2023-10-01 NOTE — Assessment & Plan Note (Signed)
 Blood pressure 88/56, she is asymptomatic currently but the hypotension and intermittent chest pain are very concerning.  Has been stable on both propranolol  and prazosin  for quite some time and this is abnormal for her.  - Send to ED for further evaluation and labs due to hypotension and new chest pain.

## 2023-10-01 NOTE — Patient Instructions (Signed)
 It was great to see you! Thank you for allowing me to participate in your care!  Our plans for today:  - because your blood pressure is so low and you have been having chest pain, I am advising you go to the emergency department for evaluation - Please make an appointment with me after leaving the hospital   Take care and seek immediate care sooner if you develop any concerns.   Dr. Glenn Lange, DO [facility]\

## 2023-10-01 NOTE — Progress Notes (Cosign Needed Addendum)
    SUBJECTIVE:   CHIEF COMPLAINT / HPI:    [name] is a 48 year old female with a history of liver transplant who presents with low blood pressure and intermittent chest pain.  She experiences intermittent chest pain since Friday, described as dull and sharp, located in the middle of her chest. The pain occurs without specific triggers and is not exacerbated by physical activity or stress. She has episodes of rapid heartbeats followed by pauses, without associated shortness of breath or lightheadedness.  Today, her blood pressure is unusually low, though she does not feel lightheaded or dizzy. She takes propranolol  for tremors, which can affect blood pressure, but has not had previous issues with this medication.   She experiences chronic knee pain due to osteoarthritis, which has been more bothersome recently. She almost fell yesterday when standing without her walker but did not sustain new injuries and did not fall.  PERTINENT  PMH / PSH: Asthma, liver transplant recipient, intellectual disability  OBJECTIVE:   BP (!) 88/56   Pulse 84   Ht 4\' 11"  (1.499 m)   Wt 230 lb 3.2 oz (104.4 kg)   LMP 12/02/2022   SpO2 100%   BMI 46.49 kg/m    General: NAD, pleasant, well-appearing Cardiac: RRR, no murmurs. Respiratory: CTAB, normal effort, No wheezes, rales or rhonchi Skin: warm and dry MSK: No edema, redness, warmth or deformity of bilateral knees, good ROM with flexion and extension without pain   ASSESSMENT/PLAN:   Chest pain Intermittent chest pain associated with palpitations.  Concerning for arrhythmia such as A-fib and/or ischemia.  Chest pain is atypical, not worse with exertion or stress.  Reassuringly, EKG is nonconcerning today with no ST segment elevation and shows NSR and she is not currently having chest pain or palpitations. - Send to ED for further evaluation and blood work due to hypotension and new chest pain. -May need referral to cardiology for  stress test and heart monitor in future if ED workup unrevealing  Hypotension Blood pressure 88/56, she is asymptomatic currently but the hypotension and intermittent chest pain are very concerning.  Has been stable on both propranolol  and prazosin  for quite some time and this is abnormal for her.  - Send to ED for further evaluation and labs due to hypotension and new chest pain.  Osteoarthritis of right knee Knee pain likely due to known OA.  No evidence concerning for joint infection and no recent injury.  Can discuss further at next visit after ED if desired   Patient had initially presented today to discuss starting low-dose of prednisone  for 5 days leading up to her scheduled D&C, on the day of the [facility] and the day after given her her significant history of asthma.  Can discuss over the phone or at future visit pending how this ED visit goes today  Dr. Glenn Lange, DO [facility]

## 2023-10-01 NOTE — Assessment & Plan Note (Addendum)
 Intermittent chest pain associated with palpitations.  Concerning for arrhythmia such as A-fib and/or ischemia.  Chest pain is atypical, not worse with exertion or stress.  Reassuringly, EKG is nonconcerning today with no ST segment elevation and shows NSR and she is not currently having chest pain or palpitations. - Send to ED for further evaluation and blood work due to hypotension and new chest pain. -May need referral to cardiology for stress test and heart monitor in future if ED workup unrevealing

## 2023-10-01 NOTE — ED Triage Notes (Signed)
 Pt to the ed from ems from PCP without complaints. PCP called 911 for hypotension with a systolic of 88. EMS relays BP of 130/70. Pt denies cp, sob, dizziness, loc.

## 2023-10-01 NOTE — ED Provider Notes (Signed)
 [place] EMERGENCY DEPARTMENT AT [facility] Provider Note   CSN: [id number] Arrival date & time: 10/01/23  1042     History  Chief Complaint  Patient presents with   Hypotension    [name] is a 48 y.o. female.  48 year old sent for low blood pressure from PCPs office.  Was being seen for chest pain and palpitations and she notes she has had intermittently since Friday.  Not currently having any symptoms.  No near-syncope or syncope.  Notes she has a little cough, but denies shortness of breath.  No other infectious symptoms.  No nausea vomiting diarrhea.  Low risk for PE based on Wells criteria        Home Medications Prior to Admission medications   Medication Sig Start Date End Date Taking? Authorizing Provider  acetaminophen  (TYLENOL ) 325 MG tablet Take 325-650 mg by mouth every 4 (four) hours as needed for moderate pain (pain score 4-6).    [provider]  albuterol  (PROVENTIL ) (2.5 MG/3ML) 0.083% nebulizer solution Take 3 mLs (2.5 mg total) by nebulization every 4 (four) hours as needed for wheezing or shortness of breath. 1 vial via nebulizer every 4-6 hours as needed. 04/02/23   Kozlow, Rema Care, MD  ammonium lactate  (AMLACTIN) 12 % cream Apply 1 Application topically as needed for dry skin.    [provider]  azelastine  (ASTELIN ) 0.1 % nasal spray Place 2 sprays into both nostrils 2 (two) times daily. 04/02/23   Kozlow, Rema Care, MD  benztropine  (COGENTIN ) 1 MG tablet Take 1 tablet (1 mg total) by mouth 2 (two) times daily. 01/31/23   McDiarmid, Demetra Filter, MD  budesonide  (PULMICORT ) 0.5 MG/2ML nebulizer solution Take 2 mLs (0.5 mg total) by nebulization every 4 (four) hours as needed. 08/14/22   Kozlow, Rema Care, MD  budesonide -formoterol  (SYMBICORT ) 160-4.5 MCG/ACT inhaler Inhale 2 puffs into the lungs 2 (two) times daily. Use with spacer and mask. 04/02/23   Kozlow, Rema Care, MD  carboxymethylcellulose (REFRESH PLUS) 0.5 % SOLN Place 1 drop into  both eyes 4 (four) times daily as needed (for dry eyes).    [provider]  cetirizine  (ZYRTEC ) 10 MG tablet Take 1 tablet (10 mg total) by mouth daily as needed for allergies (Can use an extra dose during flares). Patient taking differently: Take 10 mg by mouth daily. 04/02/23   Kozlow, Rema Care, MD  cycloSPORINE  modified 100 MG CAPS Take 100 mg by mouth 2 (two) times daily.    [provider]  divalproex  (DEPAKOTE  ER) 500 MG 24 hr tablet Take 2 tablets (1,000 mg total) by mouth at bedtime. 08/29/23   Phebe Brasil, MD  doxepin  (SINEQUAN ) 25 MG capsule Take 1 capsule (25 mg total) by mouth at bedtime. 08/08/23   McDiarmid, Demetra Filter, MD  famotidine  (PEPCID ) 20 MG tablet Take 1 tablet (20 mg total) by mouth 2 (two) times daily. 08/08/23   McDiarmid, Demetra Filter, MD  ferrous sulfate  325 (65 FE) MG EC tablet Take 1 tablet (325 mg total) by mouth daily. 08/08/23   McDiarmid, Demetra Filter, MD  fluticasone  (FLONASE ) 50 MCG/ACT nasal spray Place 1 spray into both nostrils in the morning and at bedtime. Patient taking differently: Place 2 sprays into both nostrils in the morning and at bedtime. 04/02/23   Kozlow, Rema Care, MD  glucose blood (ACCU-CHEK GUIDE TEST) test strip Use as instructed 08/27/23   McDiarmid, Demetra Filter, MD  levothyroxine  (SYNTHROID ) 75 MCG tablet TAKE 1 TABLET(75 MCG)  BY MOUTH EVERY MORNING 30 MINUTES BEFORE FOOD 04/05/23   Glenn Lange, DO  mometasone  (ELOCON ) 0.1 % cream Apply topically 2 (two) times daily as needed. Can use on skin one to two times daily if needed. Patient taking differently: Apply 1 Application topically 2 (two) times daily. 04/02/23   Kozlow, Rema Care, MD  montelukast  (SINGULAIR ) 10 MG tablet Take 1 tablet (10 mg total) by mouth at bedtime. 04/02/23   Kozlow, Eric J, MD  Multiple Vitamin (MULTIVITAMIN) tablet Take 2 tablets by mouth daily. 9 am. 10/12/13   Lissa Riding, NP  Nebulizers MISC 1 kit by Does not apply route as directed. 08/14/22   Kozlow, Rema Care, MD  omeprazole   (PRILOSEC) 40 MG capsule TAKE 1 CAPSULE(40 MG) BY MOUTH TWICE DAILY 05/13/23   Kozlow, Eric J, MD  polyethylene glycol (MIRALAX  / GLYCOLAX ) packet Take 17 g by mouth 2 (two) times daily. In water or juice    [provider]  prazosin  (MINIPRESS ) 2 MG capsule Take 1 capsule (2 mg total) by mouth at bedtime. 01/31/23   McDiarmid, Demetra Filter, MD  propranolol  (INDERAL ) 10 MG tablet Take 1 tablet (10 mg total) by mouth 2 (two) times daily. 08/08/23   McDiarmid, Demetra Filter, MD  RA VITAMIN D -3 50 MCG (2000 UT) CAPS Take 1 capsule (2,000 Units total) by mouth daily. 02/19/23   McDiarmid, Demetra Filter, MD  Respiratory Therapy Supplies (NEBULIZER/TUBING/MOUTHPIECE) KIT 2 each by Does not apply route as directed. 12/22/18   Kozlow, Rema Care, MD  risperiDONE  (RISPERDAL  M-TABS) 3 MG disintegrating tablet Take 1.5-3 mg by mouth See admin instructions. Take 1.5 mg in the morning and 3 mg at night 08/08/23   [provider]  Spacer/Aero-Hold Chamber Mask MISC Use as directed with inhaler. 08/14/22   Kozlow, Rema Care, MD  Tiotropium Bromide Monohydrate  (SPIRIVA  RESPIMAT) 1.25 MCG/ACT AERS Inhale 2 puffs into the lungs in the morning. 04/02/23   Kozlow, Rema Care, MD  VENTOLIN  HFA 108 (90 Base) MCG/ACT inhaler Inhale 2 puffs into the lungs every 4 (four) hours as needed for wheezing or shortness of breath. 04/02/23   Kozlow, Eric J, MD  Wheat Dextrin (BENEFIBER DRINK MIX PO) Take 1 Scoop by mouth 3 (three) times a week.    [provider]      Allergies    Tramadol hcl, Aspirin, Ibuprofen, Penicillins, Pseudoephedrine, Sulfa antibiotics, Acetaminophen , Guaifenesin , Latex, and Rosemary oil    Review of Systems   Review of Systems  Physical Exam Updated Vital Signs BP 136/75 (BP Location: Right Arm)   Pulse 77   Temp 98.5 F (36.9 C) (Oral)   Resp 16   Ht 4\' 11"  (1.499 m)   Wt 104 kg   LMP 12/02/2022   SpO2 98%   BMI 46.31 kg/m  Physical Exam Vitals and nursing note reviewed.  Constitutional:       Appearance: She is obese.  HENT:     Head: Normocephalic and atraumatic.     Nose: Nose normal.     Mouth/Throat:     Mouth: Mucous membranes are moist.  Eyes:     Conjunctiva/sclera: Conjunctivae normal.  Cardiovascular:     Rate and Rhythm: Normal rate and regular rhythm.  Pulmonary:     Effort: Pulmonary effort is normal.     Breath sounds: Normal breath sounds.  Abdominal:     General: Abdomen is flat. There is no distension.     Palpations: Abdomen is soft.  Tenderness: There is no abdominal tenderness. There is no guarding or rebound.  Musculoskeletal:     Right lower leg: No edema.     Left lower leg: No edema.  Skin:    General: Skin is warm.     Capillary Refill: Capillary refill takes less than 2 seconds.  Neurological:     Mental Status: She is alert and oriented to person, place, and time.  Psychiatric:        Mood and Affect: Mood normal.        Behavior: Behavior normal.     ED Results / Procedures / Treatments   Labs (all labs ordered are listed, but only abnormal results are displayed) Labs Reviewed  CBC - Abnormal; Notable for the following components:      Result Value   WBC 3.3 (*)    RBC 3.77 (*)    Hemoglobin 10.7 (*)    HCT 33.8 (*)    All other components within normal limits  COMPREHENSIVE METABOLIC PANEL WITH GFR - Abnormal; Notable for the following components:   Glucose, Bld 113 (*)    Creatinine, Ser 1.04 (*)    Albumin 3.4 (*)    All other components within normal limits  TROPONIN I (HIGH SENSITIVITY)  TROPONIN I (HIGH SENSITIVITY)    EKG EKG Interpretation Date/Time:  Tuesday Oct 01 2023 12:07:37 EDT Ventricular Rate:  76 PR Interval:  136 QRS Duration:  72 QT Interval:  400 QTC Calculation: 450 R Axis:   11  Text Interpretation: Normal sinus rhythm Normal ECG When compared with ECG of 23-Jul-2023 00:24, PREVIOUS ECG IS PRESENT Confirmed by Elise Guile [phone]) on 10/01/2023 12:30:56 PM  Radiology DG Chest 2 View Result  Date: 10/01/2023 CLINICAL DATA:  Chest pain. EXAM: CHEST - 2 VIEW COMPARISON:  07/22/2023. FINDINGS: The heart size and mediastinal contours are within normal limits. Interval resolution of the previously noted right basilar and patchy bilateral opacities. No new focal consolidation. No pleural effusion or pneumothorax. No acute osseous abnormality. IMPRESSION: Interval resolution of the previously noted right basilar and patchy bilateral opacities. No acute cardiopulmonary findings. Electronically Signed   By: Mannie Seek M.D.   On: 10/01/2023 12:16    Procedures Procedures    Medications Ordered in ED Medications - No data to display  ED Course/ Medical Decision Making/ A&P Clinical Course as of 10/01/23 1553  Tue Oct 01, 2023  1248 CBC(!) No change from priors [TY]  1248 Comprehensive metabolic panel(!) No significant electrolyte abnormalities.  No transaminitis to suggest acute liver failure [TY]  1249 DG Chest 2 View IMPRESSION: Interval resolution of the previously noted right basilar and patchy bilateral opacities. No acute cardiopulmonary findings.   Electronically Signed   By: Fredda Jacobus  Late   [TY]  1553 Troponins negative.  Labs reassuring.  Normotensive.  EKG without arrhythmia.  She is remained normotensive here.  Patient to follow-up with primary doctor.  Stable for discharge. [TY]    Clinical Course User Index [TY] Rolinda Climes, DO                                 Medical Decision Making Well-appearing 48 year old female presenting emergency department with reported hypotension at PCP.  She is normotensive here.  Afebrile nontachycardic maintaining oxygen saturation.  Essentially asymptomatic.  Per chart review had blood pressures 88 systolic at PCP office.  Was being seen for chest pain and palpitations.  Will get screening labs, EKG and chest x-ray.  Low risk for PE based on Wells criteria.    See ED course for further MDM disposition.  Amount and/or  Complexity of Data Reviewed Independent Historian:     Details: EMS reports blood pressure 88 systolic at PCP office.  No meds given prior to arrival. External Data Reviewed:     Details: Abnormal stress test in 2018 Labs: ordered. Decision-making details documented in ED Course. Radiology: ordered and independent interpretation performed. Decision-making details documented in ED Course. ECG/medicine tests: ordered and independent interpretation performed.  Risk Decision regarding hospitalization. Diagnosis or treatment significantly limited by social determinants of health.          Final Clinical Impression(s) / ED Diagnoses Final diagnoses:  Transient hypotension    Rx / DC Orders ED Discharge Orders     None         Rolinda Climes, DO 10/01/23 1553

## 2023-10-01 NOTE — Assessment & Plan Note (Addendum)
 Knee pain likely due to known OA.  No evidence concerning for joint infection and no recent injury.  Can discuss further at next visit after ED if desired

## 2023-10-02 DIAGNOSIS — I10 Essential (primary) hypertension: Secondary | ICD-10-CM | POA: Diagnosis not present

## 2023-10-03 ENCOUNTER — Ambulatory Visit: Attending: Family Medicine

## 2023-10-03 ENCOUNTER — Ambulatory Visit: Admitting: Pharmacist

## 2023-10-03 ENCOUNTER — Telehealth: Payer: Self-pay | Admitting: Student

## 2023-10-03 DIAGNOSIS — R002 Palpitations: Secondary | ICD-10-CM

## 2023-10-03 DIAGNOSIS — I10 Essential (primary) hypertension: Secondary | ICD-10-CM | POA: Diagnosis not present

## 2023-10-03 DIAGNOSIS — R079 Chest pain, unspecified: Secondary | ICD-10-CM

## 2023-10-03 NOTE — Telephone Encounter (Signed)
 Called pt, recommended referral to cardiology for evaluation of chest pain prior to having D&C with OBGYN given her recent chest pain and palpitations. She states she has continued to have both of these intermittently since our last visit. Reassuringly, chest pain does not seemed to be related to or worsened with exertion or stress. I will go ahead and order a zio patch to be done while waiting for her to get in with cardiology. She was advised to have her uncle and care giver help her apply the Zio patch but if there is any confusion about how to do it, she should call [facility] to schedule an appointment so we can help her. Pt agrees with plan.

## 2023-10-03 NOTE — Progress Notes (Unsigned)
 EP to read.

## 2023-10-04 ENCOUNTER — Inpatient Hospital Stay (HOSPITAL_COMMUNITY): Admission: RE | Admit: 2023-10-04 | Source: Ambulatory Visit

## 2023-10-04 DIAGNOSIS — I10 Essential (primary) hypertension: Secondary | ICD-10-CM | POA: Diagnosis not present

## 2023-10-05 DIAGNOSIS — I10 Essential (primary) hypertension: Secondary | ICD-10-CM | POA: Diagnosis not present

## 2023-10-06 DIAGNOSIS — I10 Essential (primary) hypertension: Secondary | ICD-10-CM | POA: Diagnosis not present

## 2023-10-07 ENCOUNTER — Ambulatory Visit: Admitting: Pharmacist

## 2023-10-07 ENCOUNTER — Encounter: Payer: Self-pay | Admitting: Pharmacist

## 2023-10-07 VITALS — BP 127/89 | HR 90 | Wt 231.0 lb

## 2023-10-07 DIAGNOSIS — F819 Developmental disorder of scholastic skills, unspecified: Secondary | ICD-10-CM

## 2023-10-07 DIAGNOSIS — I10 Essential (primary) hypertension: Secondary | ICD-10-CM | POA: Diagnosis not present

## 2023-10-07 NOTE — Patient Instructions (Addendum)
 It was nice to see you today!   Use the  PURPLE BOX first  - Add ONLY multi-vitamin and vitamin D   Use the Green Boxes second - ADD PROPRANOLOL  and multi-vitamin and vitamin D    Medication Changes: None Please pickup refills for the following medications prior to your next visit/refill  Purchase New Glucose Meter battery - #[id number]    Refills of medications need for next fill include:   Benztropine  Cetirizine  Cyclosporin Montelukast  Propranolol  Prazosin 

## 2023-10-07 NOTE — Assessment & Plan Note (Signed)
 Polypharmacy - Medication Reconciliation - Medication list reviewed and updated for patient with intellectual disability AND history of liver transplant.   Understanding of regimen: good  Understanding of indications: good  Potential of compliance: good with use of pill box Patient has known adherence challenges including lack of knowledge, vision loss.   - The following issues were noted - multiple pills found outside of pill box - at the bottom of patient medication bag.  Several days where 1-2 medications was not take out of the pill box.  (Missed medications on 2-3 different days over the previous 3 weeks.  Patient will return in ~2 weeks on Tuesday (patient preference of day)  for refill of up to 3 weeks supply.

## 2023-10-07 NOTE — Progress Notes (Signed)
    S:     Chief Complaint  Patient presents with   Medication Management    Medication Review and Pill Box Fill   48 y.o. who presents for medication management due to concern of polypharmacy and need for medication adherence monitoring and assistance.   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 Rochelle Chu on 08/27/2023.  Recently evaluated for heart palpitations and is wearing a ZIO patch at arrival to her visit.     Patient arrives in good spirits and presents with assistance of walker. During this visit we attempted to fill 3 week supply.  Unfortunately, she was unable to obtain a new supply of propranolol .  She also failed to bring multi-vitamins and Vitamin D  supply.    Insurance Coverage: Medicaid  Do you know what each of your medicines is for? yes Do you feel that your medications are working for you? yes Have you been experiencing any side effects to the medications prescribed? no   O:  Review of Systems  All other systems reviewed and are negative.   Physical Exam Vitals reviewed.  Constitutional:      Appearance: Normal appearance.  Pulmonary:     Effort: Pulmonary effort is normal.  Neurological:     Mental Status: She is alert.  Psychiatric:        Mood and Affect: Mood normal.        Thought Content: Thought content normal.     A/P: Polypharmacy - Medication Reconciliation - Medication list reviewed and updated for patient with intellectual disability AND history of liver transplant.   Understanding of regimen: good  Understanding of indications: good  Potential of compliance: good with use of pill box Patient has known adherence challenges including lack of knowledge, vision loss.   - The following issues were noted - multiple pills found outside of pill box - at the bottom of patient medication bag.  Several days where 1-2 medications was not take out of the  pill box.  (Missed medications on 2-3 different days over the previous 3 weeks.  Patient will return in ~2 weeks on Tuesday (patient preference of day)  for refill of up to 3 weeks supply.    Patient did not have adequate supply of propranolol .  I contacted the pharmacy and they shared that a supply of propranolol  AND three other medications was available for pick-up at this time.  Appears, there is currently no issue with medication supply of propranolol  other than pick-up.     Home glucose monitor is working and home glucose readings were reported in target-range.    Written patient instructions and updated medication list provided.  Refills of medications need for next fill include: Benztropine  Cetirizine  Cyclosporin Montelukast  Propranolol  Prazosin    Total time in face to face counseling 41 minutes.    Patient seen with Noah [place], PharmD Candidate and Volney Grumbles, PharmD, PGY-1 pharmacy resident.

## 2023-10-08 ENCOUNTER — Ambulatory Visit (HOSPITAL_COMMUNITY): Admit: 2023-10-08 | Admitting: Obstetrics and Gynecology

## 2023-10-08 DIAGNOSIS — N950 Postmenopausal bleeding: Secondary | ICD-10-CM

## 2023-10-08 DIAGNOSIS — Z01818 Encounter for other preprocedural examination: Secondary | ICD-10-CM

## 2023-10-08 DIAGNOSIS — I10 Essential (primary) hypertension: Secondary | ICD-10-CM | POA: Diagnosis not present

## 2023-10-08 SURGERY — DILATATION & CURETTAGE/HYSTEROSCOPY WITH RESECTOCOPE
Anesthesia: Choice

## 2023-10-09 DIAGNOSIS — I10 Essential (primary) hypertension: Secondary | ICD-10-CM | POA: Diagnosis not present

## 2023-10-09 NOTE — Progress Notes (Signed)
 Reviewed and agree with Dr Macky Lower plan.

## 2023-10-10 DIAGNOSIS — I10 Essential (primary) hypertension: Secondary | ICD-10-CM | POA: Diagnosis not present

## 2023-10-11 DIAGNOSIS — I10 Essential (primary) hypertension: Secondary | ICD-10-CM | POA: Diagnosis not present

## 2023-10-12 DIAGNOSIS — I10 Essential (primary) hypertension: Secondary | ICD-10-CM | POA: Diagnosis not present

## 2023-10-13 DIAGNOSIS — I10 Essential (primary) hypertension: Secondary | ICD-10-CM | POA: Diagnosis not present

## 2023-10-14 DIAGNOSIS — I10 Essential (primary) hypertension: Secondary | ICD-10-CM | POA: Diagnosis not present

## 2023-10-15 DIAGNOSIS — I10 Essential (primary) hypertension: Secondary | ICD-10-CM | POA: Diagnosis not present

## 2023-10-16 DIAGNOSIS — I10 Essential (primary) hypertension: Secondary | ICD-10-CM | POA: Diagnosis not present

## 2023-10-17 DIAGNOSIS — I10 Essential (primary) hypertension: Secondary | ICD-10-CM | POA: Diagnosis not present

## 2023-10-18 DIAGNOSIS — I10 Essential (primary) hypertension: Secondary | ICD-10-CM | POA: Diagnosis not present

## 2023-10-19 DIAGNOSIS — I10 Essential (primary) hypertension: Secondary | ICD-10-CM | POA: Diagnosis not present

## 2023-10-20 DIAGNOSIS — I10 Essential (primary) hypertension: Secondary | ICD-10-CM | POA: Diagnosis not present

## 2023-10-21 DIAGNOSIS — R002 Palpitations: Secondary | ICD-10-CM | POA: Diagnosis not present

## 2023-10-21 DIAGNOSIS — I10 Essential (primary) hypertension: Secondary | ICD-10-CM | POA: Diagnosis not present

## 2023-10-22 ENCOUNTER — Ambulatory Visit: Payer: Self-pay | Admitting: Student

## 2023-10-22 ENCOUNTER — Encounter: Payer: Self-pay | Admitting: Pharmacist

## 2023-10-22 ENCOUNTER — Ambulatory Visit: Admitting: Pharmacist

## 2023-10-22 VITALS — BP 133/61 | HR 92 | Wt 234.4 lb

## 2023-10-22 DIAGNOSIS — F819 Developmental disorder of scholastic skills, unspecified: Secondary | ICD-10-CM | POA: Diagnosis not present

## 2023-10-22 DIAGNOSIS — I10 Essential (primary) hypertension: Secondary | ICD-10-CM | POA: Diagnosis not present

## 2023-10-22 NOTE — Assessment & Plan Note (Signed)
 Polypharmacy - Medication Reconciliation - Medication list reviewed and updated for patient with intellectual disability AND history of liver transplant.   Understanding of regimen: good  Understanding of indications: good  Potential of compliance: good with use of pill box Patient has known adherence challenges including lack of knowledge, vision loss.   - The following issues were noted:  Noted   Patient will return in 3 weeks on Tuesday (patient preference of day) for refill of up to 3 weeks supply.     Home glucose monitor is working and home glucose readings were reported in target-range.    Written patient instructions and updated medication list provided.  Refills of medications need for next fill include:  Divalproex  Famotidine  Propranolol  Risperidone 

## 2023-10-22 NOTE — Patient Instructions (Addendum)
 It was nice to see you today!  Use the [employer] FIRST !  They are full     Use the  PURPLE BOX Last - Add ONLY vitamin D  to last two days (Monday and Tuesday) prior to your next visit.   Medication Changes: None Please pickup refills for the following medications prior to your next visit/refill   Purchase New Glucose Meter battery - [id number]    Refills of medications need for next fill include: Divalproex  Famotidine  Propranolol  Risperidone    Purchase - Vitamin D 

## 2023-10-22 NOTE — Progress Notes (Signed)
    S:     Chief Complaint  Patient presents with   Medication Management     Medication Review and Med Box Fill    48 y.o. who presents for medication management due to concern of polypharmacy and need for medication adherence monitoring and assistance.   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 Rochelle Woodward on 10/01/2023.    Patient arrives in good spirits and presents with assistance of walker. During this visit we attempted to fill 3 week supply.    Unfortunately, she was unable to obtain a new supply of propranolol .     Insurance Coverage: Medicaid  Do you know what each of your medicines is for? yes Do you feel that your medications are working for you? yes Have you been experiencing any side effects to the medications prescribed? no    Patient reported dietary habits: Eats 3 meals/day Drinks: Water - cranberry juice  O:  Review of Systems  Respiratory:  Negative for shortness of breath.   All other systems reviewed and are negative.   Physical Exam Vitals reviewed.  Constitutional:      Appearance: Normal appearance.  Pulmonary:     Effort: Pulmonary effort is normal.  Neurological:     Mental Status: She is alert.  Psychiatric:        Mood and Affect: Mood normal.        Thought Content: Thought content normal.      Vitals:   10/22/23 1359  BP: 133/61  Pulse: 92  SpO2: 98%     A/P: Polypharmacy - Medication Reconciliation - Medication list reviewed and updated for patient with intellectual disability AND history of liver transplant.   Understanding of regimen: good  Understanding of indications: good  Potential of compliance: good with use of pill box Patient has known adherence challenges including lack of knowledge, vision loss.   - The following issues were noted:  Noted   Patient will return in 3 weeks on Tuesday (patient preference of day) for  refill of up to 3 weeks supply.     Home glucose monitor is working and home glucose readings were reported in target-range.    Written patient instructions and updated medication list provided.  Refills of medications need for next fill include:  Divalproex  Famotidine  Propranolol  Risperidone   Buy Vitamin D  supplement    Total time in face to face counseling 38 minutes.    Follow up pharmacist visit 6/17 PCP Clinic Visit PRN Patient seen with [name], PharmD Candidate and [name], PharmD, PGY-1 pharmacy resident.

## 2023-10-23 ENCOUNTER — Other Ambulatory Visit: Payer: Self-pay

## 2023-10-23 DIAGNOSIS — I10 Essential (primary) hypertension: Secondary | ICD-10-CM | POA: Diagnosis not present

## 2023-10-23 NOTE — Patient Instructions (Signed)
 Visit Information  Ms. Griner was given information about Medicaid Managed Care team care coordination services as a part of their Healthy [facility] Medicaid benefit. Mykayla Deyjah Kindel verbally consented to engagement with the [facility] Managed Care team.   If you are experiencing a medical emergency, please call 911 or report to your local emergency department or urgent care.   If you have a non-emergency medical problem during routine business hours, please contact your provider's office and ask to speak with a nurse.   For questions related to your Healthy [facility] health plan, please call: [phone] or visit the homepage here: [email or web address]  If you would like to schedule transportation through your Healthy [facility] plan, please call the following number at least 2 days in advance of your appointment: [phone]  For information about your ride after you set it up, call Ride Assist at [phone]. Use this number to activate a Will Call pickup, or if your transportation is late for a scheduled pickup. Use this number, too, if you need to make a change or cancel a previously scheduled reservation.  If you need transportation services right away, call [phone]. The after-hours call center is staffed 24 hours to handle ride assistance and urgent reservation requests (including discharges) 365 days a year. Urgent trips include sick visits, hospital discharge requests and life-sustaining treatment.  Call the [facility] Line at [phone], at any time, 24 hours a day, 7 days a week. If you are in danger or need immediate medical attention call 911.  If you would like help to quit smoking, call 1-800-QUIT-NOW (4807125373) OR Espaol: 1-855-Djelo-Ya (7-564-332-9518) o para ms informacin haga clic aqu or Text READY to 841-660 to register via text  [name] - following are the goals we discussed in your visit today:    Goals Addressed             This Visit's Progress    VBCI RN Care Plan       Problems:  Knowledge Deficits related to plan of care for management of GYN Issue Amenorrhea  Goal: Over the next 60 days the Patient will attend all scheduled medical appointments: with PCP and specialist as evidenced by keeping all scheduled appointments        demonstrate Ongoing adherence to prescribed treatment plan for GYN Issue Amenorrheaas evidenced by no admissions to the hospital verbalize basic understanding of GYN Issue Amenorrhea disease process and self health management plan as evidenced by verbal explanation lifestyle changes and consistent medication compliance   Interventions: Evaluation of current treatment plan related to  self management and patient's adherence to plan as established by provider     GYN Issue  (Status:  New goal.)  Long Term Goal Evaluation of current treatment plan related to  Amenorrhea ,  self-management and patient's adherence to plan as established by provider. Discussed plans with patient for ongoing care management follow up and provided patient with direct contact information for care management team Collaborated with GYN provider regarding patient needing to reschedule Endometrial biopsy performed in the OR Assessed social determinant of health barriers Provided education regarding abnormal vaginal bleeding, ensured patient has not had bleeding since before christmas of last year. The appointment has been postponed until she has her appointment with cardiology   Patient Goals/Self-Care Activities: Take all medications as prescribed Attend all scheduled provider appointments Attend church or other social activities Call provider office for new concerns or questions  Reschedule missed appointments   Follow Up Plan:  Telephone follow up appointment with care management team member scheduled for:  11/14/23 215 pm          VBCI RN Care Plan       Problems:   Chronic Disease Management support and education needs related to Asthma  Goal: Over the next 30 days the Patient will attend all scheduled medical appointments: with PCP and specialist as evidenced by keeping al scheduled appointments        demonstrate Ongoing adherence to prescribed treatment plan for Asthma as evidenced by no admissions to the hospital verbalize basic understanding of Asthma disease process and self health management plan as evidenced by verbal explanation lifestyle changes and consistent medication compliance   Interventions:    Asthma: Advised patient to track and manage Asthma triggers Advised patient to self assesses Asthma action plan zone and make appointment with provider if in the yellow zone for 48 hours without improvement Provided education about and advised patient to utilize infection prevention strategies to reduce risk of respiratory infection Discussed the importance of adequate rest and management of fatigue with Asthma Screening for signs and symptoms of depression related to chronic disease state  Assessed social determinant of health barriers    Patient Self-Care Activities:  Attend all scheduled provider appointments Call pharmacy for medication refills 3-7 days in advance of running out of medications Call provider office for new concerns or questions  Take medications as prescribed    Plan:  Telephone follow up appointment with care management team member scheduled for:  11/14/23  215 pm             Please see education materials related to Asthma and Amenorrhea provided by MyChart link.  The patient verbalized understanding of instructions, educational materials, and care plan provided today and DECLINED offer to receive copy of patient instructions, educational materials, and care plan.    Follow Up Plan:  Telephone follow up appointment with care management team member scheduled for:  11/14/23 215 pm    Augustin Leber RN, BSN,  [facility]  [facility], [facility]  Care Coordinator Phone: [phone]     Following is a copy of your plan of care:  There are no care plans that you recently modified to display for this patient.

## 2023-10-23 NOTE — Progress Notes (Signed)
 Reviewed and agree with Dr Macky Lower plan.

## 2023-10-23 NOTE — Patient Outreach (Signed)
 Complex Woodward Management   Visit Note  10/23/2023  Name:  [name] MRN: [id number] DOB: 09-06-1975  Situation: Referral received for Complex [facility] Management related to Asthma  and Amenorrhea I obtained verbal consent from Patient.  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/2013   Fibromyalgia    GERD (gastroesophageal reflux disease)    GI-- dr [name]   History of Bell's palsy 11/2018   left side   History of gastric ulcer    IDA (iron  deficiency anemia)    Immunosuppression due to drug therapy (HCC)    Inflammatory dermatosis    hands   Intellectual disability    developmental delay   Liver transplant recipient [facility]) 06/2013   followed by [name]--- Dr [name];   for ESLD secondary PBC   Morbid obesity (HCC) 07/25/2006   Qualifier: Diagnosis of   By: [name]         Woodward (osteoarthritis)    OSA (obstructive sleep apnea)    study many years ago,  no cpap  (study available)   Perennial allergic rhinitis    PMB (postmenopausal bleeding)    PONV (postoperative nausea and vomiting)    Primary biliary cirrhosis (HCC) 2012   s/p  liver transplant in 2015   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   Schizoaffective disorder, bipolar type (HCC)    Seasonal allergic rhinitis due to pollen    Seizure disorder (HCC)    congenital epilepsy;   neuorologist--- dr [name]   Sickle cell trait (HCC)    Vitamin D  deficiency     Assessment: Patient Reported Symptoms:  Cognitive Cognitive Status: Able to follow simple commands, Alert and oriented to person, place, and time, Normal speech and language  skills      Neurological Neurological Review of Symptoms: No symptoms reported    HEENT HEENT Symptoms Reported: No symptoms reported      Cardiovascular Cardiovascular Symptoms Reported: Chest pain or discomfort Does patient have uncontrolled Hypertension?: No Cardiovascular Conditions: Chest pain Cardiovascular Management Strategies:  (physician is sending her to a cardiololgist)  Respiratory Respiratory Symptoms Reported: Shortness of breath, Wheezing Respiratory Conditions: Asthma, Shortness of breath, Seasonal allergies Respiratory Comment: Uses inhalers and nebulizer  Endocrine Is patient diabetic?: Yes Is patient checking blood sugars at home?: Yes Endocrine Conditions: Diabetes Endocrine Management Strategies: Medication therapy  Gastrointestinal Gastrointestinal Symptoms Reported: Constipation Gastrointestinal Conditions: Constipation Gastrointestinal Management Strategies: Medication therapy    Genitourinary Genitourinary Symptoms Reported: No symptoms reported    Integumentary Integumentary Symptoms Reported: No symptoms reported    Musculoskeletal Musculoskelatal Symptoms Reviewed: Difficulty walking Musculoskeletal Conditions: Mobility limited, Unsteady gait Musculoskeletal Management Strategies: Exercise Falls in the past year?: Yes Number of falls in past year: 1 or less Was there an injury with Fall?: No Fall Risk Category Calculator: 1 Patient Fall Risk Level: Low Fall Risk Patient at Risk for Falls Due to: Impaired balance/gait Fall risk Follow up: Falls evaluation completed, Education provided, Falls prevention discussed  Psychosocial       Quality of Family Relationships: involved, supportive  10/23/2023    1:59 PM  Depression screen PHQ 2/9  Decreased Interest 0  Down, Depressed, Hopeless 1  PHQ - 2 Score 1    There were no vitals filed for this visit.  Medications Reviewed Today     Reviewed by Lori Leber, RN (Registered Nurse) on  10/23/23 at 1341  Med List Status: <None>   Medication Order Taking? Sig Documenting Provider Last Dose Status Informant  acetaminophen  (TYLENOL ) 325 MG tablet [id number] Yes Take 325-650 mg by mouth every 4 (four) hours as needed for moderate pain (pain score 4-6). Provider, Historical, Woodward Taking Active Self           Med Note [name], [name]   Tue Oct 22, 2023  1:44 PM) Only PRN - 1 use recently  albuterol  (PROVENTIL ) (2.5 MG/3ML) 0.083% nebulizer solution [id number] Yes Take 3 mLs (2.5 mg total) by nebulization every 4 (four) hours as needed for wheezing or shortness of breath. 1 vial via nebulizer every 4-6 hours as needed. [name], Lori Care, Woodward Taking Active Self  ammonium lactate  (AMLACTIN) 12 % cream [id number] Yes Apply 1 Application topically as needed for dry skin. Provider, Historical, Woodward Taking Active Self  azelastine  (ASTELIN ) 0.1 % nasal spray [id number] Yes Place 2 sprays into both nostrils 2 (two) times daily. [name], Lori Care, Woodward Taking Active Self  benztropine  (COGENTIN ) 1 MG tablet [id number] Yes Take 1 tablet (1 mg total) by mouth 2 (two) times daily. [name], Lori Filter, Woodward Taking Active Self  budesonide  (PULMICORT ) 0.5 MG/2ML nebulizer solution [id number] Yes Take 2 mLs (0.5 mg total) by nebulization every 4 (four) hours as needed. [name], Lori Care, Woodward Taking Active Self           Med Note [name]   Thu Jul 18, 2023 12:13 PM)    budesonide -formoterol  (SYMBICORT ) 160-4.5 MCG/ACT inhaler [id number] Yes Inhale 2 puffs into the lungs 2 (two) times daily. Use with spacer and mask. [name], Lori Care, Woodward Taking Active Self  carboxymethylcellulose (REFRESH PLUS) 0.5 % SOLN [id number] Yes Place 1 drop into both eyes 4 (four) times daily as needed (for dry eyes). Provider, Historical, Woodward Taking Active Self  cetirizine  (ZYRTEC ) 10 MG tablet [id number] Yes Take 1 tablet (10 mg total) by mouth daily as needed for allergies (Can use an extra dose during flares).  Patient taking differently: Take 10 mg  by mouth at bedtime.   [name], [name], Woodward Taking Active Self  cycloSPORINE  modified 100 MG CAPS [id number] Yes Take 100 mg by mouth 2 (two) times daily. Provider, Historical, Woodward Taking Active Self           Med Note Henrene Locust Jul 18, 2023 12:17 PM)    divalproex  (DEPAKOTE  ER) 500 MG 24 hr tablet [id number] Yes Take 2 tablets (1,000 mg total) by mouth at bedtime. Phebe Brasil, Woodward Taking Active Self  doxepin  (SINEQUAN ) 25 MG capsule [id number] Yes Take 1 capsule (25 mg total) by mouth at bedtime. [name], Lori Filter, Woodward Taking Active Self  famotidine  (PEPCID ) 20 MG tablet [id number] Yes Take 1 tablet (20 mg total) by mouth 2 (two) times daily. [name], Lori Filter, Woodward Taking Active Self  ferrous sulfate  325 (65 FE) MG EC tablet [id number] Yes Take 1 tablet (325 mg total) by mouth daily. [name], Lori Filter, Woodward Taking Active Self  fluticasone  (FLONASE ) 50 MCG/ACT nasal spray [id number] Yes Place 1 spray into both nostrils in the morning and at bedtime.  Patient taking differently: Place  2 sprays into both nostrils in the morning and at bedtime.   [name], [name], Woodward Taking Active Self           Med Note Dennice Fishman A   Mon Sep 30, 2023 11:32 AM)    fluticasone  (FLOVENT  HFA) 110 MCG/ACT inhaler [id number] Yes Inhale 2 puffs into the lungs 2 (two) times daily. Uses - as needed for asthma flares Provider, Historical, Woodward Taking Active Self           Med Note [name], [name]   Mon Oct 07, 2023  9:03 AM) Taking PRN 2 puffs BID  glucose blood (ACCU-CHEK GUIDE TEST) test strip [id number] Yes Use as instructed [name], Lori Filter, Woodward Taking Active Self  levothyroxine  (SYNTHROID ) 75 MCG tablet [id number] Yes TAKE 1 TABLET(75 MCG) BY MOUTH EVERY MORNING 30 MINUTES BEFORE FOOD Glenn Lange, DO Taking Active Self  mometasone  (ELOCON ) 0.1 % cream [id number]  Apply topically 2 (two) times daily as needed. Can use on skin one to two times daily if needed.  Patient taking differently: Apply 1 Application topically 2 (two)  times daily.   [name], [name], [facility]ward  Active Self  montelukast  (SINGULAIR ) 10 MG tablet [id number] Yes Take 1 tablet (10 mg total) by mouth at bedtime. Fabienne Holter, Woodward Taking Active Self  Multiple Vitamin (MULTIVITAMIN) tablet [id number] Yes Take 2 tablets by mouth daily. 9 am. Lissa Riding, NP Taking Active Self           Med Note Francisco Irving Oct 07, 2023  9:13 AM) Did NOT bring today  Nebulizers MISC [id number] Yes 1 kit by Does not apply route as directed. [name], [name], Woodward Taking Active Self  omeprazole  (PRILOSEC) 40 MG capsule [id number] Yes TAKE 1 CAPSULE(40 MG) BY MOUTH TWICE DAILY [name], [name], Woodward Taking Active Self  polyethylene glycol (MIRALAX  / GLYCOLAX ) packet [id number] Yes Take 17 g by mouth 2 (two) times daily. In water or juice Provider, Historical, Woodward Taking Active Self           Med Note Dennice Fishman A   Mon Sep 30, 2023 11:37 AM)    prazosin  (MINIPRESS ) 2 MG capsule [id number] Yes Take 1 capsule (2 mg total) by mouth at bedtime. [name], Lori Filter, Woodward Taking Active Self  propranolol  (INDERAL ) 10 MG tablet [id number] Yes Take 1 tablet (10 mg total) by mouth 2 (two) times daily. [name], Lori Filter, Woodward Taking Active Self  RA VITAMIN D -3 50 MCG (2000 UT) CAPS [id number] Yes Take 1 capsule (2,000 Units total) by mouth daily. [name], Lori Filter, Woodward Taking Active Self           Med Note Francisco Irving Oct 07, 2023  9:14 AM) Did not bring for pill box vill.    Respiratory Therapy Supplies (NEBULIZER/TUBING/MOUTHPIECE) KIT [id number] Yes 2 each by Does not apply route as directed. [name], Lori Care, Woodward Taking Active Self  risperiDONE  (RISPERDAL  M-TABS) 3 MG disintegrating tablet [id number] Yes Take 1.5-3 mg by mouth See admin instructions. Take 1.5 mg in the morning and 3 mg at night Provider, Historical, Woodward Taking Active Self  Spacer/Aero-Hold Chamber Mask MISC [id number] Yes Use as directed with inhaler. [name], Lori Care, Woodward Taking Active Self  Tiotropium Bromide Monohydrate   (SPIRIVA  RESPIMAT) 1.25 MCG/ACT AERS [id number] Yes Inhale 2 puffs into the lungs in the morning. [name], [name], Woodward Taking Active Self  VENTOLIN  HFA [phone] 9625 Base) MCG/ACT inhaler [id number]  Yes Inhale 2 puffs into the lungs every 4 (four) hours as needed for wheezing or shortness of breath. [name], [name], Woodward Taking Active Self  Wheat Dextrin (BENEFIBER DRINK MIX PO) [id number] Yes Take 1 Scoop by mouth 3 (three) times a week. Provider, Historical, Woodward Taking Active Self            Recommendation:   Acute PCP follow-up with Cardiology and Pulmonology 12/03/23.  Follow Up Plan:   Telephone follow up appointment with Woodward management team member scheduled for:  11/14/23  215 pm   Lori Leber RN, BSN, [facility]  [facility], [facility]  [facility] Coordinator Phone: [phone]

## 2023-10-24 ENCOUNTER — Ambulatory Visit: Admitting: Podiatry

## 2023-10-24 ENCOUNTER — Encounter: Payer: Self-pay | Admitting: Podiatry

## 2023-10-24 DIAGNOSIS — M79674 Pain in right toe(s): Secondary | ICD-10-CM | POA: Diagnosis not present

## 2023-10-24 DIAGNOSIS — D696 Thrombocytopenia, unspecified: Secondary | ICD-10-CM | POA: Diagnosis not present

## 2023-10-24 DIAGNOSIS — M79675 Pain in left toe(s): Secondary | ICD-10-CM | POA: Diagnosis not present

## 2023-10-24 DIAGNOSIS — I10 Essential (primary) hypertension: Secondary | ICD-10-CM | POA: Diagnosis not present

## 2023-10-24 DIAGNOSIS — B351 Tinea unguium: Secondary | ICD-10-CM

## 2023-10-24 NOTE — Progress Notes (Signed)
This patient returns to the office for evaluation and treatment of long thick painful nails .  This patient is unable to trim her own nails since the patient cannot reach her feet.  Patient says the nails are painful walking and wearing her shoes.  She returns for preventive foot care services.  General Appearance  Alert, conversant and in no acute stress.  Vascular  Dorsalis pedis and posterior tibial  pulses are palpable  bilaterally.  Capillary return is within normal limits  bilaterally. Temperature is within normal limits  bilaterally.  Neurologic  Senn-Weinstein monofilament wire test within normal limits  bilaterally. Muscle power within normal limits bilaterally.  Nails Thick disfigured discolored nails with subungual debris  from hallux to fifth toes bilaterally. No evidence of bacterial infection or drainage bilaterally.  Orthopedic  No limitations of motion  feet .  No crepitus or effusions noted.  No bony pathology or digital deformities noted.  Skin  normotropic skin with no porokeratosis noted bilaterally.  No signs of infections or ulcers noted.     Onychomycosis  Pain in toes right foot  Pain in toes left foot  Debridement  of nails  1-5  B/L with a nail nipper.  Nails were then filed using a dremel tool with no incidents.    RTC  3 months   Nitza Schmid DPM   

## 2023-10-25 DIAGNOSIS — I10 Essential (primary) hypertension: Secondary | ICD-10-CM | POA: Diagnosis not present

## 2023-10-26 DIAGNOSIS — I10 Essential (primary) hypertension: Secondary | ICD-10-CM | POA: Diagnosis not present

## 2023-10-27 DIAGNOSIS — I10 Essential (primary) hypertension: Secondary | ICD-10-CM | POA: Diagnosis not present

## 2023-10-28 ENCOUNTER — Other Ambulatory Visit: Payer: Self-pay | Admitting: Student

## 2023-10-28 DIAGNOSIS — Z944 Liver transplant status: Secondary | ICD-10-CM | POA: Diagnosis not present

## 2023-10-28 DIAGNOSIS — E66813 Obesity, class 3: Secondary | ICD-10-CM | POA: Diagnosis not present

## 2023-10-28 DIAGNOSIS — Z713 Dietary counseling and surveillance: Secondary | ICD-10-CM | POA: Diagnosis not present

## 2023-10-28 DIAGNOSIS — D849 Immunodeficiency, unspecified: Secondary | ICD-10-CM | POA: Diagnosis not present

## 2023-10-28 DIAGNOSIS — Z6841 Body Mass Index (BMI) 40.0 and over, adult: Secondary | ICD-10-CM | POA: Diagnosis not present

## 2023-10-28 DIAGNOSIS — R7989 Other specified abnormal findings of blood chemistry: Secondary | ICD-10-CM | POA: Diagnosis not present

## 2023-10-28 DIAGNOSIS — E038 Other specified hypothyroidism: Secondary | ICD-10-CM

## 2023-10-28 DIAGNOSIS — N183 Chronic kidney disease, stage 3 unspecified: Secondary | ICD-10-CM | POA: Diagnosis not present

## 2023-10-29 DIAGNOSIS — I10 Essential (primary) hypertension: Secondary | ICD-10-CM | POA: Diagnosis not present

## 2023-10-30 DIAGNOSIS — I10 Essential (primary) hypertension: Secondary | ICD-10-CM | POA: Diagnosis not present

## 2023-10-31 ENCOUNTER — Telehealth: Payer: Self-pay

## 2023-10-31 DIAGNOSIS — I10 Essential (primary) hypertension: Secondary | ICD-10-CM | POA: Diagnosis not present

## 2023-10-31 NOTE — Telephone Encounter (Signed)
 Patient LVM on nurse line requesting to speak with PCP.   She reports an elevated "lab" with [name]. She reports they told her to FU with us .   I attempted to call patient back for more information, however no answer.   Patient has an apt with PCP on 6/16.   Please review labs and let me know if she needs to be seen sooner.

## 2023-11-01 ENCOUNTER — Other Ambulatory Visit: Payer: Self-pay | Admitting: Student

## 2023-11-01 DIAGNOSIS — I10 Essential (primary) hypertension: Secondary | ICD-10-CM | POA: Diagnosis not present

## 2023-11-01 NOTE — Progress Notes (Signed)
 Received fax from [facility] that they are starting pt on Ursodiol  500 mg TID which they would like added to her pill box. Will route this note to Dr. Koval who sees pt regularly to help fill pill boxes.

## 2023-11-02 DIAGNOSIS — I10 Essential (primary) hypertension: Secondary | ICD-10-CM | POA: Diagnosis not present

## 2023-11-03 DIAGNOSIS — I10 Essential (primary) hypertension: Secondary | ICD-10-CM | POA: Diagnosis not present

## 2023-11-04 DIAGNOSIS — I10 Essential (primary) hypertension: Secondary | ICD-10-CM | POA: Diagnosis not present

## 2023-11-05 ENCOUNTER — Encounter: Payer: Self-pay | Admitting: *Deleted

## 2023-11-05 DIAGNOSIS — I10 Essential (primary) hypertension: Secondary | ICD-10-CM | POA: Diagnosis not present

## 2023-11-06 DIAGNOSIS — I10 Essential (primary) hypertension: Secondary | ICD-10-CM | POA: Diagnosis not present

## 2023-11-07 DIAGNOSIS — I10 Essential (primary) hypertension: Secondary | ICD-10-CM | POA: Diagnosis not present

## 2023-11-08 DIAGNOSIS — I10 Essential (primary) hypertension: Secondary | ICD-10-CM | POA: Diagnosis not present

## 2023-11-09 DIAGNOSIS — I10 Essential (primary) hypertension: Secondary | ICD-10-CM | POA: Diagnosis not present

## 2023-11-09 NOTE — Progress Notes (Unsigned)
    SUBJECTIVE:   CHIEF COMPLAINT / HPI:   Pt described intermittent chest pain and palpitations at last apt. Zio patch has been reassuring. I have recommended cardiology referral for possibly stress test prior to anesthesia for D&C with GYN in near future in setting of AUB. She has yet to schedule an apt with cardiology.  States the chest pain has decreaed significantly in frequency, not even occurring weekly but she still feels like her heart beats fast, then slow and sometimes skips a beat.  Has not had any uterine bleeding since December.  She recently had visit with [name] liver clinic and was told her thyroid  level was off and she should come see her PCP.  She is feeling well today.   PERTINENT  PMH / PSH: hx liver transplant, asthma, intellectual disability   OBJECTIVE:   BP 102/64   Pulse 87   Ht 4' 11 (1.499 m)   Wt 230 lb (104.3 kg)   LMP 12/02/2022   SpO2 97%   BMI 46.45 kg/m    General: NAD, pleasant, well-appearing Cardiac: RRR, no murmurs. Respiratory: CTAB, normal effort, No wheezes, rales or rhonchi Neuro: alert, no obvious focal deficits Psych: Normal affect and mood  ASSESSMENT/PLAN:   Subclinical hypothyroidism   Discussed TSH is WNL, do not recommend dose change of synthroid  75 mcg daily  -Continue synthroid    Chest pain Called cardiology office, scheduled apt for 11/20/23 for further evaluation of intermittent chest pain and palpitations which advise decreasing frequency.  Reassuringly, patient is well-appearing and Zio patch was non concerning.  Not currently having chest pain.  Postmenopausal bleeding No uterine bleeding since 04/2023 Plan for [facility] with GYN after cardiology evaluation. Will need one more apt at [facility] after cardiology eval to discuss short course of prednisone  10 mg daily 5 days leading up to procedure, day of and day after to reduce risk of airway irritation with intubation given her significant asthma.  After patient is optimized, new  PCP should message Dr. Loralyn Rochester to let her know patient is ready to be scheduled for procedure  Recently received orders from [facility] liver clinic requesting A1c and lipid panel.  These have been ordered and will be faxed over to [facility] liver clinic when lab results return.  I will place a picture of the orders in the media tab.  Dr. Glenn Lange, DO [facility]

## 2023-11-10 DIAGNOSIS — I10 Essential (primary) hypertension: Secondary | ICD-10-CM | POA: Diagnosis not present

## 2023-11-11 ENCOUNTER — Encounter: Payer: Self-pay | Admitting: Pharmacist

## 2023-11-11 ENCOUNTER — Other Ambulatory Visit: Payer: Self-pay | Admitting: Allergy and Immunology

## 2023-11-11 ENCOUNTER — Encounter: Payer: Self-pay | Admitting: Student

## 2023-11-11 ENCOUNTER — Ambulatory Visit: Admitting: Student

## 2023-11-11 ENCOUNTER — Ambulatory Visit: Admitting: Pharmacist

## 2023-11-11 VITALS — BP 102/64 | HR 87 | Ht 59.0 in | Wt 230.0 lb

## 2023-11-11 DIAGNOSIS — E038 Other specified hypothyroidism: Secondary | ICD-10-CM

## 2023-11-11 DIAGNOSIS — R079 Chest pain, unspecified: Secondary | ICD-10-CM

## 2023-11-11 DIAGNOSIS — R739 Hyperglycemia, unspecified: Secondary | ICD-10-CM

## 2023-11-11 DIAGNOSIS — Z13220 Encounter for screening for lipoid disorders: Secondary | ICD-10-CM | POA: Diagnosis not present

## 2023-11-11 DIAGNOSIS — F79 Unspecified intellectual disabilities: Secondary | ICD-10-CM

## 2023-11-11 DIAGNOSIS — I10 Essential (primary) hypertension: Secondary | ICD-10-CM | POA: Diagnosis not present

## 2023-11-11 DIAGNOSIS — N950 Postmenopausal bleeding: Secondary | ICD-10-CM | POA: Diagnosis not present

## 2023-11-11 LAB — POCT GLYCOSYLATED HEMOGLOBIN (HGB A1C): Hemoglobin A1C: 5.7 % — AB (ref 4.0–5.6)

## 2023-11-11 NOTE — Patient Instructions (Addendum)
 It was great to see you! Thank you for allowing me to participate in your care!  I recommend that you always bring your medications to each appointment as this makes it easy to ensure you are on the correct medications and helps us  not miss when refills are needed.  Our plans for today:  - Go to your cardiology appointment on 11/20/2023 - Schedule an appointment with us  for the week after the cardiology appointment for a preop visit to get you optimized for your procedure with GYN - Continue current medications - For your bug bite, you can purchase over-the-counter hydrocortisone  cream and apply twice daily.  If the bug bite gets worse, becomes very tender, starts draining fluid, please return  We are checking some labs today, I will call you if they are abnormal will send you a MyChart message or a letter if they are normal.  If you do not hear about your labs in the next 2 weeks please let us  know.  Take care and seek immediate care sooner if you develop any concerns.   Dr. Glenn Lange, DO [facility]

## 2023-11-11 NOTE — Assessment & Plan Note (Signed)
 Polypharmacy - Medication Reconciliation - Medication list reviewed and updated for patient with intellectual disability AND history of liver transplant.   Understanding of regimen: good  Understanding of indications: good  Potential of compliance: good with use of pill box - Due to addition of TID medication regimen and preference to take cyclosporin at 4:00 PM a 4X per day pill box was initiated in place of BID box today.  Three full weeks of medications were filled into boxes.  Patient has known adherence challenges including lack of knowledge, vision loss.    Patient will return in 3 weeks on Monday for planned refill of up to 3 weeks supply.

## 2023-11-11 NOTE — Patient Instructions (Signed)
 Great to see you again - Enjoy the new pill boxes.   Medication Changes: None - Urosodiol at NOON  Cyclosporin at 4:00 PM Please pickup refills for the following medications prior to your next visit/refill    Refills of medications need for next fill include: Albuterol  Nebulizer Benztropine  Cyclosporin Doxepin  Famotidine  Montelukast  Propranolol  Prazosin  Ursodiol 

## 2023-11-11 NOTE — Progress Notes (Signed)
    S:     Chief Complaint  Patient presents with   Medication Management    Medication Review and Pill Box Fill   48 y.o. who presents for medication management due to concern of polypharmacy and need for medication adherence monitoring and assistance.   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 Rochelle Chu earlier today.     Patient arrives in good spirits and presents with assistance of walker. During this visit we attempted to fill 3 week supply.     Since last visit Ursodiol  500mg  TID has been added to medication regimen and Iron  (ferrous sulfate ) has been discontinued.  Patient preferred to keep remaining supply of Iron .  Three bottles were packaged into a single bad for potential later use.  Patient aware that she should not use the iron  at this time.     Insurance Coverage: Medicaid  Do you know what each of your medicines is for? Yes Do you feel that your medications are working for you? yes Have you been experiencing any side effects to the medications prescribed? no    O:  Review of Systems  Respiratory:  Positive for cough and shortness of breath.   All other systems reviewed and are negative.   Physical Exam Vitals reviewed.  Constitutional:      Appearance: Normal appearance.  Pulmonary:     Effort: Pulmonary effort is normal.   Neurological:     Mental Status: She is alert.   Psychiatric:        Mood and Affect: Mood normal.   Vitals were performed earlier the AM at PCP visit and were reviewed.  A/P: Polypharmacy - Medication Reconciliation - Medication list reviewed and updated for patient with intellectual disability AND history of liver transplant.   Understanding of regimen: good  Understanding of indications: good  Potential of compliance: good with use of pill box - Due to addition of TID medication regimen and preference to take cyclosporin at 4:00 PM  a 4X per day pill box was initiated in place of BID box today.  Three full weeks of medications were filled into boxes.  Patient has known adherence challenges including lack of knowledge, vision loss.    Patient will return in 3 weeks on Monday for planned refill of up to 3 weeks supply.     Written patient instructions and updated medication list provided.  Refills of medications need for next fill include:   Albuterol  Nebulizer Benztropine  Cyclosporin Doxepin  Famotidine  Montelukast  Propranolol  Prazosin  Ursodiol     Total time in face to face counseling 44 minutes.     Follow up pharmacist visit 12/02/2023 PCP Clinic Visit PRN

## 2023-11-12 ENCOUNTER — Ambulatory Visit: Admitting: Pharmacist

## 2023-11-12 DIAGNOSIS — I10 Essential (primary) hypertension: Secondary | ICD-10-CM | POA: Diagnosis not present

## 2023-11-12 LAB — LIPID PANEL
Chol/HDL Ratio: 5.1 ratio — ABNORMAL HIGH (ref 0.0–4.4)
Cholesterol, Total: 226 mg/dL — ABNORMAL HIGH (ref 100–199)
HDL: 44 mg/dL
LDL Chol Calc (NIH): 132 mg/dL — ABNORMAL HIGH (ref 0–99)
Triglycerides: 282 mg/dL — ABNORMAL HIGH (ref 0–149)
VLDL Cholesterol Cal: 50 mg/dL — ABNORMAL HIGH (ref 5–40)

## 2023-11-12 NOTE — Assessment & Plan Note (Signed)
  Discussed TSH is WNL, do not recommend dose change of synthroid  75 mcg daily  -Continue synthroid

## 2023-11-12 NOTE — Assessment & Plan Note (Addendum)
 No uterine bleeding since 04/2023 Plan for [facility] with GYN after cardiology evaluation. Will need one more apt at [facility] after cardiology eval to discuss short course of prednisone  10 mg daily 5 days leading up to procedure, day of and day after to reduce risk of airway irritation with intubation given her significant asthma.  After patient is optimized, new PCP should message Dr. Loralyn Rochester to let her know patient is ready to be scheduled for procedure

## 2023-11-12 NOTE — Assessment & Plan Note (Addendum)
 Called cardiology office, scheduled apt for 11/20/23 for further evaluation of intermittent chest pain and palpitations which advise decreasing frequency.  Reassuringly, patient is well-appearing and Zio patch was non concerning.  Not currently having chest pain.

## 2023-11-13 ENCOUNTER — Ambulatory Visit: Payer: Self-pay | Admitting: Student

## 2023-11-13 DIAGNOSIS — I10 Essential (primary) hypertension: Secondary | ICD-10-CM | POA: Diagnosis not present

## 2023-11-13 NOTE — Progress Notes (Signed)
 Reviewed and agree with Dr Macky Lower plan.

## 2023-11-13 NOTE — Telephone Encounter (Signed)
 Called pt to discuss lipid panel and A1C showing 10 year risk of cardiovascular risk is only 1.9% so there is no indication for a statin medication and A1c of 5.7 in prediabetic range. Discussed lifestyle modifications.  I will have results sent to [facility] as they were the ordering providers.

## 2023-11-14 ENCOUNTER — Telehealth: Payer: Self-pay

## 2023-11-14 DIAGNOSIS — I10 Essential (primary) hypertension: Secondary | ICD-10-CM | POA: Diagnosis not present

## 2023-11-15 DIAGNOSIS — I10 Essential (primary) hypertension: Secondary | ICD-10-CM | POA: Diagnosis not present

## 2023-11-16 DIAGNOSIS — I10 Essential (primary) hypertension: Secondary | ICD-10-CM | POA: Diagnosis not present

## 2023-11-17 DIAGNOSIS — I10 Essential (primary) hypertension: Secondary | ICD-10-CM | POA: Diagnosis not present

## 2023-11-18 ENCOUNTER — Telehealth: Payer: Self-pay

## 2023-11-18 DIAGNOSIS — I10 Essential (primary) hypertension: Secondary | ICD-10-CM | POA: Diagnosis not present

## 2023-11-18 NOTE — Progress Notes (Signed)
 Complex Care Management Care Guide Note  11/18/2023 Name: Tuere Nwosu MRN: [id number] DOB: 06/26/1975  Lazariah Laisha Rau is a 48 y.o. year old female who is a primary care patient of Joshua Domino, DO and is actively engaged with the care management team. I reached out to Blakelynn Naomia Favor by phone today to assist with re-scheduling  with the RN Case Manager.  Follow up plan: Telephone appointment with complex care management team member scheduled for:  12-17-23   Leotis Rase [facility], [facility] Guide  Direct Dial: [phone]  Fax [phone]

## 2023-11-19 DIAGNOSIS — I10 Essential (primary) hypertension: Secondary | ICD-10-CM | POA: Diagnosis not present

## 2023-11-20 ENCOUNTER — Ambulatory Visit: Attending: Cardiology | Admitting: Cardiology

## 2023-11-20 ENCOUNTER — Encounter: Payer: Self-pay | Admitting: Cardiology

## 2023-11-20 VITALS — BP 134/82 | HR 88 | Ht 59.0 in | Wt 232.6 lb

## 2023-11-20 DIAGNOSIS — R002 Palpitations: Secondary | ICD-10-CM | POA: Insufficient documentation

## 2023-11-20 DIAGNOSIS — R079 Chest pain, unspecified: Secondary | ICD-10-CM | POA: Insufficient documentation

## 2023-11-20 DIAGNOSIS — Z79899 Other long term (current) drug therapy: Secondary | ICD-10-CM | POA: Diagnosis not present

## 2023-11-20 DIAGNOSIS — I471 Supraventricular tachycardia, unspecified: Secondary | ICD-10-CM | POA: Insufficient documentation

## 2023-11-20 DIAGNOSIS — R011 Cardiac murmur, unspecified: Secondary | ICD-10-CM | POA: Diagnosis not present

## 2023-11-20 DIAGNOSIS — I10 Essential (primary) hypertension: Secondary | ICD-10-CM | POA: Diagnosis not present

## 2023-11-20 MED ORDER — METOPROLOL TARTRATE 100 MG PO TABS: 100.0000 mg | ORAL_TABLET | Freq: Once | ORAL | 0 refills | Status: DC

## 2023-11-20 MED ORDER — METOPROLOL SUCCINATE ER 25 MG PO TB24: 25.0000 mg | ORAL_TABLET | Freq: Every day | ORAL | 3 refills | Status: DC

## 2023-11-20 NOTE — Patient Instructions (Signed)
 Medication Instructions:  Please STOP taking inderal .   Please START Toprol 25 mg daily.   *If you need a refill on your cardiac medications before your next appointment, please call your pharmacy*  Lab Work: Please complete a BMET in our first floor lab today before you leave.  If you have labs (blood work) drawn today and your tests are completely normal, you will receive your results only by: MyChart Message (if you have MyChart) OR A paper copy in the mail If you have any lab test that is abnormal or we need to change your treatment, we will call you to review the results.  Testing/Procedures: Your physician has requested that you have an echocardiogram. Echocardiography is a painless test that uses sound waves to create images of your heart. It provides your doctor with information about the size and shape of your heart and how well your heart's chambers and valves are working. This procedure takes approximately one hour. There are no restrictions for this procedure. Please do NOT wear cologne, perfume, aftershave, or lotions (deodorant is allowed). Please arrive 15 minutes prior to your appointment time.  Please note: We ask at that you not bring children with you during ultrasound (echo/ vascular) testing. Due to room size and safety concerns, children are not allowed in the ultrasound rooms during exams. Our front office staff cannot provide observation of children in our lobby area while testing is being conducted. An adult accompanying a patient to their appointment will only be allowed in the ultrasound room at the discretion of the ultrasound technician under special circumstances. We apologize for any inconvenience.    Your cardiac CT will be scheduled at:    [facility] [address]  [place], [place] [address]    If scheduled at the [facility] at [address], please enter the parking lot using the [address] entrance  and use the FREE valet service at the patient drop-off area. Enter the buidling and check-in with registration on the main floor.   Please follow these instructions carefully (unless otherwise directed):  An IV will be required for this test and Nitroglycerin will be given.  Hold all erectile dysfunction medications at least 3 days (72 hrs) prior to test. (Ie viagra, cialis, sildenafil, tadalafil, etc)   On the Night Before the Test: Be sure to Drink plenty of water. Do not consume any caffeinated/decaffeinated beverages or chocolate 12 hours prior to your test. Do not take any antihistamines 12 hours prior to your test.  On the Day of the Test: Drink plenty of water until 1 hour prior to the test. Do not eat any food 1 hour prior to test. You may take your regular medications prior to the test.  Take 100 mg of metoprolol tartrate (Lopressor) two hours prior to test. Take this in place of your metoprolol succinate that day.  If you take Furosemide /Hydrochlorothiazide/Spironolactone /Chlorthalidone, please HOLD on the morning of the test. FEMALES- please wear underwire-free bra if available, avoid dresses & tight clothing       After the Test: Drink plenty of water. After receiving IV contrast, you may experience a mild flushed feeling. This is normal. On occasion, you may experience a mild rash up to 24 hours after the test. This is not dangerous. If this occurs, you can take Benadryl  25 mg, Zyrtec , Claritin , or Allegra and increase your fluid intake. (Patients taking Tikosyn should avoid Benadryl , and may take Zyrtec , Claritin , or Allegra) If  you experience trouble breathing, this can be serious. If it is severe call 911 IMMEDIATELY. If it is mild, please call our office.  We will call to schedule your test 2-4 weeks out understanding that some insurance companies will need an authorization prior to the service being performed.   For more information and frequently asked questions,  please visit our website : [email or web address]  For non-scheduling related questions, please contact the cardiac imaging nurse navigator should you have any questions/concerns: Cardiac Imaging Nurse Navigators Direct Office Dial: [phone]   For scheduling needs, including cancellations and rescheduling, please call [place], [phone].   Follow-Up: At [facility], you and your health needs are our priority.  As part of our continuing mission to provide you with exceptional heart care, our providers are all part of one team.  This team includes your primary Cardiologist (physician) and Advanced Practice Providers or APPs (Physician Assistants and Nurse Practitioners) who all work together to provide you with the care you need, when you need it.  Your next appointment:   4 week(s)  Provider:   One of our Advanced Practice Providers (APPs):    Josefa Beauvais, NP   Orren Fabry, PA-C  [place], [place] Jackee Alberts, NP  Damien Braver, NP Glendia Ferrier, PA-C Lum Louis, NP Katlyn West, NP Callie Goodrich, PA-C  Evan Williams, PA-C   Kathleen Johnson, PA-C   Then, Dr. Wilbert Bihari will plan to see you again in 6 month(s).

## 2023-11-20 NOTE — Progress Notes (Addendum)
 Cardiology CONSULT Note    Date:  11/20/2023   ID:  [name], DOB 11/29/1975, MRN [id number]  PCP:  Joshua Domino, DO  Cardiologist:  Wilbert Bihari, MD   Chief Complaint  Patient presents with   New Patient (Initial Visit)    Chest pain and palpitations    Patient Profile: [name] is a 48 y.o. female who is being seen today for the evaluation of palpitations at the request of Donah, Laymon PARAS, MD.  History of Present Illness:  [name] is a 48 y.o. female who is being seen today for the evaluation of palpitations at the request of Donah Laymon PARAS, MD.  This is a 48 year old African-American female with a history of asthma, CKD stage IIIa, fibromyalgia, GERD, liver transplant, OSA, primary biliary cirrhosis, schizoaffective disorder and sickle cell trait.  She was recently seen by her PCP and complained of intermittent chest pain and palpitations.  She is also planned to have a D&C by her GYN in the near future and her PCP wanted her to have a cardiology workup prior to that.  She was seen in the ER on 10/01/2023 with low blood pressure and complained of chest pains and palpitations as well.  High-sensitivity troponin was normal with 3 x 2 and EKG showed normal sinus rhythm 88 bpm with minimal LVH by voltage criteria and no acute ST changes.  She is now referred for further cardiac evaluation of her chest pain and palpitations.  She says that her CP is sharp and stabbing in the midsternal region.  There is no radiation of the pain.  She does get some SOB with the pain. Sometimes the left side of her neck will become numb when she gets the CP.  The CP started in March through April but has improved but then came back in May.  It lasts hours when she gets it. She also wakes up gasping for breath at night.  She feels tired when she wakes up and has to nap during the day.  She denies any LE edema, dizziness.  She thinks she may have passed out while sitting on  the couch and when she woke up she was all sweaty.  She has also been having palpitations sporadically that can last about 3 to 30 minutes at a time.   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 outlaw   History of Bell's palsy 11/2018   left side   History of gastric ulcer    IDA (iron  deficiency anemia)    Immunosuppression due to drug therapy (HCC)    Inflammatory dermatosis    hands   Intellectual disability    developmental delay   Liver transplant recipient [facility]) 06/2013   followed by [name]--- Dr Landry Quale;   for ESLD secondary PBC   Morbid obesity (HCC) 07/25/2006   Qualifier: Diagnosis of   By: LANEY MD, JOSEPH         OA (osteoarthritis)    OSA (obstructive sleep apnea)    study many  years ago,  no cpap  (study available)   Perennial allergic rhinitis    PMB (postmenopausal bleeding)    PONV (postoperative nausea and vomiting)    Primary biliary cirrhosis (HCC) 2012   s/p  liver transplant in 2015   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 burnette   ESOPHAGOGASTRODUODENOSCOPY (EGD) WITH PROPOFOL  N/A 10/01/2012   Procedure: ESOPHAGOGASTRODUODENOSCOPY (EGD) WITH PROPOFOL ;  Surgeon: Elsie burnette, MD;  Location: [facility];  Service:  Endoscopy;  Laterality: N/A;   IR US  LIVER BIOPSY  04/24/2011   @ [facility]   LAPAROSCOPIC CHOLECYSTECTOMY  12/29/2010   @ [facility] by dr d. vernetta;   AND LAPAROSCOPIC APPENDECTOMY   LIVER TRANSPLANT  07/11/2012   @ [name]   ;   Orthotopic   STRABISMUS SURGERY     CHILD   TYMPANOSTOMY TUBE PLACEMENT Bilateral    CHILD    Current Medications: Current Meds  Medication Sig   acetaminophen  (TYLENOL ) 325 MG tablet Take 325-650 mg by mouth every 4 (four) hours as needed for moderate pain (pain score 4-6).   albuterol  (PROVENTIL ) (2.5 MG/3ML) 0.083% nebulizer solution Take 3 mLs (2.5 mg total) by nebulization every 4 (four) hours as needed for wheezing or shortness of breath. 1 vial via nebulizer every 4-6 hours as needed.   ammonium lactate  (AMLACTIN) 12 % cream Apply 1 Application topically as needed for dry skin.   azelastine  (ASTELIN ) 0.1 % nasal spray Place 2 sprays into both nostrils 2 (two) times daily.   benztropine  (COGENTIN ) 1 MG tablet Take 1 tablet (1 mg total) by mouth 2 (two) times daily.   budesonide  (PULMICORT ) 0.5 MG/2ML nebulizer solution Take 2 mLs (0.5 mg total) by nebulization every 4 (four) hours as needed.   budesonide -formoterol  (SYMBICORT ) 160-4.5 MCG/ACT inhaler Inhale 2 puffs into the lungs 2 (two) times daily. Use with spacer and mask.   carboxymethylcellulose (REFRESH PLUS) 0.5 % SOLN Place 1 drop into both eyes 4 (four) times daily as needed (for dry eyes).   cetirizine  (ZYRTEC ) 10 MG tablet Take 1 tablet (10 mg total) by mouth daily as needed for allergies (Can use an extra dose during flares). (Patient taking differently: Take 10 mg by mouth at bedtime.)   cycloSPORINE  modified 100 MG CAPS Take 100 mg by mouth 2 (two) times daily.   divalproex  (DEPAKOTE  ER) 500 MG 24 hr tablet Take 2 tablets (1,000 mg total) by mouth at bedtime.   doxepin  (SINEQUAN ) 25 MG capsule Take 1 capsule (25 mg total) by mouth at bedtime.   famotidine  (PEPCID ) 20 MG tablet Take 1 tablet (20 mg total)  by mouth 2 (two) times daily.   fluticasone  (FLONASE ) 50 MCG/ACT nasal spray Place 1 spray into both nostrils in the morning and at bedtime. (Patient taking differently: Place 2 sprays into both nostrils daily.)   fluticasone  (FLOVENT  HFA) 110 MCG/ACT inhaler Inhale 2 puffs into the lungs 2 (two) times daily. Uses - as needed for asthma flares (Patient taking differently: Inhale 2 puffs into the lungs 2 (two) times daily as needed. Uses - as needed for asthma flares)  glucose blood (ACCU-CHEK GUIDE TEST) test strip Use as instructed   levothyroxine  (SYNTHROID ) 75 MCG tablet TAKE 1 TABLET(75 MCG) BY MOUTH EVERY MORNING 30 MINUTES BEFORE FOOD   mometasone  (ELOCON ) 0.1 % cream Apply topically 2 (two) times daily as needed. Can use on skin one to two times daily if needed.   montelukast  (SINGULAIR ) 10 MG tablet TAKE 1 TABLET(10 MG) BY MOUTH AT BEDTIME   Multiple Vitamin (MULTIVITAMIN) tablet Take 2 tablets by mouth daily. 9 am.   Nebulizers MISC 1 kit by Does not apply route as directed.   omeprazole  (PRILOSEC) 40 MG capsule TAKE 1 CAPSULE(40 MG) BY MOUTH TWICE DAILY   polyethylene glycol (MIRALAX  / GLYCOLAX ) packet Take 17 g by mouth 2 (two) times daily. In water or juice   prazosin  (MINIPRESS ) 2 MG capsule Take 1 capsule (2 mg total) by mouth at bedtime.   propranolol  (INDERAL ) 10 MG tablet Take 1 tablet (10 mg total) by mouth 2 (two) times daily.   RA VITAMIN D -3 50 MCG (2000 UT) CAPS Take 1 capsule (2,000 Units total) by mouth daily.   Respiratory Therapy Supplies (NEBULIZER/TUBING/MOUTHPIECE) KIT 2 each by Does not apply route as directed.   risperiDONE  (RISPERDAL  M-TABS) 3 MG disintegrating tablet Take 1.5-3 mg by mouth See admin instructions. Take 1.5 mg in the morning and 3 mg at night   Spacer/Aero-Hold Chamber Mask MISC Use as directed with inhaler.   Tiotropium Bromide Monohydrate  (SPIRIVA  RESPIMAT) 1.25 MCG/ACT AERS Inhale 2 puffs into the lungs in the morning.   ursodiol  (ACTIGALL ) 500 MG  tablet Take 500 mg by mouth 3 (three) times daily.   VENTOLIN  HFA 108 (90 Base) MCG/ACT inhaler Inhale 2 puffs into the lungs every 4 (four) hours as needed for wheezing or shortness of breath.   Wheat Dextrin (BENEFIBER DRINK MIX PO) Take 1 Scoop by mouth 3 (three) times a week.    Allergies:   Tramadol hcl, Aspirin, Ibuprofen, Penicillins, Pseudoephedrine, Sulfa antibiotics, Acetaminophen , Guaifenesin , Latex, and Rosemary oil   Social History   Socioeconomic History   Marital status: Single    Spouse name: Not on file   Number of children: 0   Years of education: 12 th   Highest education level: Not on file  Occupational History    Comment: Disabled  Tobacco Use   Smoking status: Never    Passive exposure: Never   Smokeless tobacco: Never  Vaping Use   Vaping status: Never Used  Substance and Sexual Activity   Alcohol  use: No    Alcohol /week: 0.0 standard drinks of alcohol    Drug use: No   Sexual activity: Never  Other Topics Concern   Not on file  Social History Narrative   Lives alone but uncle and his wife live next door and are her primary caregivers. Christopher (uncle) can be reached at [phone].    Education High school education.   Disabled.   Right handed.   Caffeine tea and coffee daily.         Current Social History   (Please include date ( . td) when updating information )      Who lives at home: lives  alone 07/05/2016    Transportation: uses Medicaid transportation to and from appointments 07/05/2016   Important Relationships & Pets: Higinio Christopher lives next door 07/05/2016    Current Stressors: Person Care aid will stop this month 07/05/2016   Work / Education:  Receives disability 07/05/2016   Religious / Personal Beliefs: has strong religious  beliefs 07/05/2016   Interests / Fun: Enjoys going to bible study  07/05/2016   Other: enjoys reading her bible 07/05/2016                                                                                                          Social Drivers of Health   Financial Resource Strain: Low Risk  (02/08/2021)   Overall Financial Resource Strain (CARDIA)    Difficulty of Paying Living Expenses: Not very hard  Food Insecurity: No Food Insecurity (08/12/2023)   Hunger Vital Sign    Worried About Running Out of Food in the Last Year: Never true    Ran Out of Food in the Last Year: Never true  Transportation Needs: No Transportation Needs (08/12/2023)   PRAPARE - Administrator, Civil Service (Medical): No    Lack of Transportation (Non-Medical): No  Physical Activity: Not on file  Stress: No Stress Concern Present (02/08/2021)   [employer] of Occupational Health - Occupational Stress Questionnaire    Feeling of Stress : Only a little  Social Connections: Not on file     Family History:  The patient's family history includes Breast cancer in her cousin; COPD in her mother; Diabetes in her mother; Heart disease in her mother.   ROS:   Please see the history of present illness.    ROS All other systems reviewed and are negative.     12/04/2016    1:33 PM  PAD Screen  Previous PAD dx? No  Previous surgical procedure? Yes  Dates of procedures liver transplant - 2015  Pain with walking? No  Feet/toe relief with dangling? Yes  Painful, non-healing ulcers? No  Extremities discolored? Yes       PHYSICAL EXAM:   VS:  BP 134/82   Pulse 88   Ht 4' 11 (1.499 m)   Wt 232 lb 9.6 oz (105.5 kg)   LMP 12/02/2022   SpO2 99%   BMI 46.98 kg/m    GEN: Well nourished, well developed, in no acute distress  HEENT: normal  Neck: no JVD, carotid bruits, or masses Cardiac: RRR; no rubs, or gallops,no edema.  Intact distal pulses bilaterally. 2/6 SM at RUSB Respiratory:  clear to auscultation bilaterally, normal work of breathing GI: soft, nontender, nondistended, + BS MS: no deformity or atrophy  Skin: warm and dry, no rash Neuro:  Alert and Oriented x 3, Strength and sensation are intact Psych:  euthymic mood, full affect  Wt Readings from Last 3 Encounters:  11/20/23 232 lb 9.6 oz (105.5 kg)  11/11/23 230 lb (104.3 kg)  10/22/23 234 lb 6.4 oz (106.3 kg)      Studies/Labs Reviewed:   EKG Interpretation Date/Time:  Wednesday November 20 2023 13:55:16 EDT Ventricular Rate:  88 PR Interval:  144 QRS Duration:  74 QT Interval:  372 QTC Calculation: 450 R Axis:   -8  Text Interpretation: Normal sinus rhythm Minimal voltage criteria for LVH, may be normal variant ( R in aVL ) When compared with ECG of 01-Oct-2023  12:07, No significant change was found Confirmed by Shlomo Corning [id number]) on 11/20/2023 2:11:35 PM   Cardiac Studies & Procedures   ______________________________________________________________________________________________   STRESS TESTS  MYOCARDIAL PERFUSION IMAGING 12/18/2016  Narrative  Nuclear stress EF: 74%.  No T wave inversion was noted during stress.  There was no ST segment deviation noted during stress.  This is a low risk study.  Low risk stress nuclear study with diaphragmatic attenuation artifact, otherwise normal perfusion and normal left ventricular regional and global systolic function.      MONITORS  LONG TERM MONITOR (3-14 DAYS) 10/18/2023  Narrative NSR with sinus brady (59/min) and sinus tachy (150/min), ave 88/min. 2 episodes of NS SVT (4 and 5 beats), rates 108-162/min. No VT, atrial fib or sustained SVT. No prolonged pauses. Rare PVC's and PAC's (<1%) Symptoms associated with NSR. Patch Wear Time:  7 days and 0 hours (2025-05-11T11:32:20-0400 to 2025-05-18T11:36:25-0400)       ______________________________________________________________________________________________      Recent Labs: 07/22/2023: B Natriuretic Peptide 68.9 07/23/2023: Magnesium  2.5; TSH 0.626 10/01/2023: ALT 15; BUN 19; Creatinine, Ser 1.04; Hemoglobin 10.7; Platelets 167; Potassium 4.6; Sodium 138   Lipid Panel    Component Value Date/Time   CHOL  226 (H) 11/11/2023 0921   TRIG 282 (H) 11/11/2023 0921   HDL 44 11/11/2023 0921   CHOLHDL 5.1 (H) 11/11/2023 0921   CHOLHDL 6.0 (H) 04/28/2015 0928   VLDL 47 (H) 04/28/2015 0928   LDLCALC 132 (H) 11/11/2023 0921   LDLDIRECT 64 07/10/2011 1115     Additional studies/ records that were reviewed today include:  ER records, ER EKG and labs in May 2025    ASSESSMENT:    1. Palpitations   2. Paroxysmal SVT (supraventricular tachycardia) (HCC)   3. Chest pain, unspecified type   4. Heart murmur      PLAN:  In order of problems listed above:  Palpitations/PSVT - EKG in the ER was normal - 2-week Zio patch to assess for arrhythmias showed several episodes of 4-5 beats runs of SVT - stop propranolol  and start Toprol XL 25mg  daily  Chest pain - Somewhat atypical -?  If related to her sickle cell disease - EKG nonischemic - Will get a coronary CTA to define coronary anatomy - check 2D echo  Time Spent: 20 minutes total time of encounter, including 15 minutes spent in face-to-face patient care on the date of this encounter. This time includes coordination of care and counseling regarding above mentioned problem list. Remainder of non-face-to-face time involved reviewing chart documents/testing relevant to the patient encounter and documentation in the medical record. I have independently reviewed documentation from referring provider  Followup:  4 weeks with PA and 6 months with me  Medication Adjustments/Labs and Tests Ordered: Current medicines are reviewed at length with the patient today.  Concerns regarding medicines are outlined above.  Medication changes, Labs and Tests ordered today are listed in the Patient Instructions below.  There are no Patient Instructions on file for this visit.   Signed, Corning Shlomo, MD  11/20/2023 2:20 PM    [facility] [address] [place], [place], [place]  [address] Phone: [phone]; Fax: [phone]

## 2023-11-20 NOTE — Addendum Note (Signed)
 Addended by: Holton Sidman L on: 11/20/2023 02:35 PM   Modules accepted: Orders

## 2023-11-21 ENCOUNTER — Ambulatory Visit: Payer: Self-pay | Admitting: Cardiology

## 2023-11-21 DIAGNOSIS — I700 Atherosclerosis of aorta: Secondary | ICD-10-CM

## 2023-11-21 DIAGNOSIS — I2510 Atherosclerotic heart disease of native coronary artery without angina pectoris: Secondary | ICD-10-CM

## 2023-11-21 DIAGNOSIS — Z8701 Personal history of pneumonia (recurrent): Secondary | ICD-10-CM

## 2023-11-21 DIAGNOSIS — I351 Nonrheumatic aortic (valve) insufficiency: Secondary | ICD-10-CM

## 2023-11-21 DIAGNOSIS — I10 Essential (primary) hypertension: Secondary | ICD-10-CM | POA: Diagnosis not present

## 2023-11-21 DIAGNOSIS — Z79899 Other long term (current) drug therapy: Secondary | ICD-10-CM

## 2023-11-21 LAB — BASIC METABOLIC PANEL WITH GFR
BUN/Creatinine Ratio: 21 (ref 9–23)
BUN: 21 mg/dL (ref 6–24)
CO2: 18 mmol/L — ABNORMAL LOW (ref 20–29)
Calcium: 9.1 mg/dL (ref 8.7–10.2)
Chloride: 105 mmol/L (ref 96–106)
Creatinine, Ser: 0.98 mg/dL (ref 0.57–1.00)
Glucose: 104 mg/dL — ABNORMAL HIGH (ref 70–99)
Potassium: 4.7 mmol/L (ref 3.5–5.2)
Sodium: 141 mmol/L (ref 134–144)
eGFR: 71 mL/min/{1.73_m2} (ref >59)

## 2023-11-22 DIAGNOSIS — I10 Essential (primary) hypertension: Secondary | ICD-10-CM | POA: Diagnosis not present

## 2023-11-23 DIAGNOSIS — I10 Essential (primary) hypertension: Secondary | ICD-10-CM | POA: Diagnosis not present

## 2023-11-24 DIAGNOSIS — I10 Essential (primary) hypertension: Secondary | ICD-10-CM | POA: Diagnosis not present

## 2023-11-25 ENCOUNTER — Encounter: Payer: Self-pay | Admitting: Family Medicine

## 2023-11-25 ENCOUNTER — Ambulatory Visit: Admitting: Family Medicine

## 2023-11-25 VITALS — BP 141/70 | HR 80 | Ht 59.0 in | Wt 229.4 lb

## 2023-11-25 DIAGNOSIS — E7800 Pure hypercholesterolemia, unspecified: Secondary | ICD-10-CM | POA: Diagnosis not present

## 2023-11-25 DIAGNOSIS — R002 Palpitations: Secondary | ICD-10-CM | POA: Diagnosis not present

## 2023-11-25 DIAGNOSIS — E66813 Obesity, class 3: Secondary | ICD-10-CM | POA: Diagnosis not present

## 2023-11-25 DIAGNOSIS — I10 Essential (primary) hypertension: Secondary | ICD-10-CM | POA: Diagnosis not present

## 2023-11-25 DIAGNOSIS — Z6841 Body Mass Index (BMI) 40.0 and over, adult: Secondary | ICD-10-CM

## 2023-11-25 NOTE — Patient Instructions (Signed)
 It was great to see you!  Our plans for today:  - Start the metoprolol  for your palpitations.  - Your cholesterol is a bit elevated but not to where we would need to start medication to lower this. Focus on eating lots of fruits and vegetables and avoiding fried, fatty, and overly processed foods. Increasing your activity level can also help with this. See below for diet tips. - Come back in 3 months to meet your new doctor.  Take care and seek immediate care sooner if you develop any concerns.   Dr. Madelon   Here is an example of what a healthy plate looks like:    ? Make half your plate fruits and vegetables.     ? Focus on whole fruits.     ? Vary your veggies.  ? Make half your grains whole grains. -     ? Look for the word "whole" at the beginning of the ingredients list    ? Some whole-grain ingredients include whole oats, whole-wheat flour,        whole-grain corn, whole-grain brown rice, and whole rye.  ? Move to low-fat and fat-free milk or yogurt.  ? Vary your protein routine. - Meat, fish, poultry (chicken, [place]), eggs, beans (kidney, pinto), dairy.  ? Drink and eat less sodium, saturated fat, and added sugars.

## 2023-11-25 NOTE — Progress Notes (Signed)
   SUBJECTIVE:   CHIEF COMPLAINT / HPI:   Cholesterol concern  - was told by [name] her cholesterol was high and she needed to start medication to lower. - recent lipid panel 11/11/23. ASCVD 4% with today's BP. - typically eats grits, scrambled eggs, spinach, collard greens - typically eats 3 meals per day and 2-3 snacks.  - 24 hour recall: breakfast at 9:30am (scrambled eggs, grits), lunch at 11am (chicken wings and 2 egg rolls), dinner at 4pm (yogurt, applesauce). No snacks yesterday. Apple juice, almond milk, water to drink.   Palpitations - the same, hasn't started metoprolol  yet. Planning for zio monitor. Awaiting coronary CTA and echo to work up atypical chest pain.    OBJECTIVE:   BP (!) 141/70   Pulse 80   Ht 4' 11 (1.499 m)   Wt 229 lb 6.4 oz (104.1 kg)   LMP 12/02/2022   SpO2 100%   BMI 46.33 kg/m   Gen: well appearing, in NAD Card: RRR Lungs: CTAB Ext: WWP, no edema   ASSESSMENT/PLAN:   Elevated cholesterol Elevated total cholesterol and LDL though ASCVD risk only 4% and not diabetic, not recommended to start statin. Counseled on diet and exercise as able, handout provided.   Palpitations  Encouraged to start toprol . F/u with cardiology as planned.   Donald CHRISTELLA Lai, DO

## 2023-11-26 DIAGNOSIS — F250 Schizoaffective disorder, bipolar type: Secondary | ICD-10-CM | POA: Diagnosis not present

## 2023-11-26 DIAGNOSIS — I10 Essential (primary) hypertension: Secondary | ICD-10-CM | POA: Diagnosis not present

## 2023-11-26 DIAGNOSIS — F4310 Post-traumatic stress disorder, unspecified: Secondary | ICD-10-CM | POA: Diagnosis not present

## 2023-11-27 DIAGNOSIS — I10 Essential (primary) hypertension: Secondary | ICD-10-CM | POA: Diagnosis not present

## 2023-11-28 ENCOUNTER — Telehealth: Payer: Self-pay

## 2023-11-28 DIAGNOSIS — I10 Essential (primary) hypertension: Secondary | ICD-10-CM | POA: Diagnosis not present

## 2023-11-30 DIAGNOSIS — I10 Essential (primary) hypertension: Secondary | ICD-10-CM | POA: Diagnosis not present

## 2023-12-01 DIAGNOSIS — I10 Essential (primary) hypertension: Secondary | ICD-10-CM | POA: Diagnosis not present

## 2023-12-02 ENCOUNTER — Ambulatory Visit: Admitting: Pharmacist

## 2023-12-02 ENCOUNTER — Encounter: Payer: Self-pay | Admitting: Pharmacist

## 2023-12-02 VITALS — BP 124/77 | HR 80 | Wt 233.0 lb

## 2023-12-02 DIAGNOSIS — F819 Developmental disorder of scholastic skills, unspecified: Secondary | ICD-10-CM | POA: Diagnosis not present

## 2023-12-02 DIAGNOSIS — I10 Essential (primary) hypertension: Secondary | ICD-10-CM | POA: Diagnosis not present

## 2023-12-02 NOTE — Progress Notes (Signed)
    S:     Chief Complaint  Patient presents with   Medication Management    Medication Refill - Med Box    48 y.o. who presents for medication management due to concern of polypharmacy and need for medication adherence monitoring and assistance.  Due to transplant medication - not appropriate for automated pill pack.    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 Madelon on 11/25/2023.   Patient arrives in good spirits and presents with assistance of walker. During this visit we filled a 3 week supply of medication.     Since last visit metoprolol  succinate 25mg  once daily has been added to medication regimen. Iron  (ferrous sulfate ) has been discontinued but supply remains in patient possession if needed in future. Patient aware that she should not use the iron  at this time.     Insurance Coverage: Medicaid  Do you know what each of your medicines is for? Yes Do you feel that your medications are working for you? yes Have you been experiencing any side effects to the medications prescribed? no   Patient reports home glucose monitoring has multiple readings including fasting that are > 126 mg/dl She dose NOT have the diagnosis of diabetes at this time.  A1C was 5.7 on 11/11/2023   O:  Review of Systems  Respiratory:  Positive for shortness of breath.   All other systems reviewed and are negative.   Physical Exam Constitutional:      Appearance: Normal appearance.  Pulmonary:     Effort: Pulmonary effort is normal.  Neurological:     Mental Status: She is alert.  Psychiatric:        Mood and Affect: Mood normal.        Behavior: Behavior normal.        Thought Content: Thought content normal.        Judgment: Judgment normal.    Vitals:   12/02/23 0958  BP: 124/77  Pulse: 80  SpO2: 100%   A/P: Polypharmacy - Medication Reconciliation - Medication list reviewed and  updated.   Understanding of regimen: good  Understanding of indications: good  Potential of compliance: good - with use of pill boxes Patient is now taking medications a four different time per day. This 4x per day dosing is due to the addition of Actigal (ursodiol ) TID - mid day dose taken at lunch and preference to take cyclosporin at 4:00 PM.   Three full weeks of medications were filled into boxes.  Patient has known adherence challenges including lack of knowledge, vision loss.   Patient will return in 3 weeks on Monday for planned refill of up to 3 weeks supply.     Written patient instructions and updated medication list provided.  Refills of medications need for next fill include:   Benztropine  Cyclosporin Doxepin  Prazosin  Risperidone   Ursodiol     Total time in face to face counseling 49 minutes.     Follow up pharmacist visit 12/23/2023 PCP Clinic Visit PRN

## 2023-12-02 NOTE — Assessment & Plan Note (Signed)
 Polypharmacy - Medication Reconciliation - Medication list reviewed and updated.   Understanding of regimen: good  Understanding of indications: good  Potential of compliance: good - with use of pill boxes Patient is now taking medications a four different time per day. This 4x per day dosing is due to the addition of Actigal (ursodiol ) TID - mid day dose taken at lunch and preference to take cyclosporin at 4:00 PM.   Three full weeks of medications were filled into boxes.  Patient has known adherence challenges including lack of knowledge, vision loss.   Patient will return in 3 weeks on Monday for planned refill of up to 3 weeks supply.

## 2023-12-02 NOTE — Patient Instructions (Addendum)
 Great to see you again - glad to hear the new pill boxes are working well for you.    Medication Changes: None - Urosodiol at NOON  Cyclosporin at 4:00 PM Please pickup refills for the following medications prior to your next visit/refill     Refills of medications need for next fill include: Benztropine  Cyclosporin Doxepin  Prazosin  Risperidone  Ursodiol 

## 2023-12-03 ENCOUNTER — Ambulatory Visit (INDEPENDENT_AMBULATORY_CARE_PROVIDER_SITE_OTHER): Admitting: Allergy and Immunology

## 2023-12-03 VITALS — BP 128/98 | HR 91 | Temp 97.5°F | Resp 18 | Ht 59.0 in | Wt 235.5 lb

## 2023-12-03 DIAGNOSIS — J4540 Moderate persistent asthma, uncomplicated: Secondary | ICD-10-CM

## 2023-12-03 DIAGNOSIS — D849 Immunodeficiency, unspecified: Secondary | ICD-10-CM

## 2023-12-03 DIAGNOSIS — J301 Allergic rhinitis due to pollen: Secondary | ICD-10-CM

## 2023-12-03 DIAGNOSIS — J3089 Other allergic rhinitis: Secondary | ICD-10-CM | POA: Diagnosis not present

## 2023-12-03 DIAGNOSIS — L989 Disorder of the skin and subcutaneous tissue, unspecified: Secondary | ICD-10-CM | POA: Diagnosis not present

## 2023-12-03 DIAGNOSIS — K219 Gastro-esophageal reflux disease without esophagitis: Secondary | ICD-10-CM | POA: Diagnosis not present

## 2023-12-03 DIAGNOSIS — R059 Cough, unspecified: Secondary | ICD-10-CM

## 2023-12-03 DIAGNOSIS — I10 Essential (primary) hypertension: Secondary | ICD-10-CM | POA: Diagnosis not present

## 2023-12-03 DIAGNOSIS — Z8701 Personal history of pneumonia (recurrent): Secondary | ICD-10-CM

## 2023-12-03 MED ORDER — CETIRIZINE HCL 10 MG PO TABS: 10.0000 mg | ORAL_TABLET | Freq: Every day | ORAL | 1 refills | Status: DC | PRN

## 2023-12-03 MED ORDER — SPIRIVA RESPIMAT 1.25 MCG/ACT IN AERS: 2.0000 | INHALATION_SPRAY | Freq: Every morning | RESPIRATORY_TRACT | 1 refills | Status: AC

## 2023-12-03 MED ORDER — FAMOTIDINE 40 MG PO TABS: 40.0000 mg | ORAL_TABLET | Freq: Two times a day (BID) | ORAL | 1 refills | Status: DC

## 2023-12-03 MED ORDER — NEBULIZER/TUBING/MOUTHPIECE KIT: 2.0000 | PACK | 5 refills | Status: AC

## 2023-12-03 MED ORDER — VENTOLIN HFA 108 (90 BASE) MCG/ACT IN AERS: 2.0000 | INHALATION_SPRAY | RESPIRATORY_TRACT | 1 refills | Status: AC | PRN

## 2023-12-03 MED ORDER — ALBUTEROL SULFATE (2.5 MG/3ML) 0.083% IN NEBU
2.5000 mg | INHALATION_SOLUTION | RESPIRATORY_TRACT | 1 refills | Status: AC | PRN
Start: 2023-12-03 — End: ?

## 2023-12-03 MED ORDER — AZELASTINE HCL 0.1 % NA SOLN
2.0000 | Freq: Two times a day (BID) | NASAL | 1 refills | Status: DC
Start: 2023-12-03 — End: 2024-03-05

## 2023-12-03 MED ORDER — MOMETASONE FUROATE 0.1 % EX CREA: TOPICAL_CREAM | Freq: Two times a day (BID) | CUTANEOUS | 1 refills | Status: AC | PRN

## 2023-12-03 MED ORDER — BUDESONIDE 0.5 MG/2ML IN SUSP: 0.5000 mg | Freq: Four times a day (QID) | RESPIRATORY_TRACT | 1 refills | Status: DC | PRN

## 2023-12-03 MED ORDER — FLUTICASONE PROPIONATE 50 MCG/ACT NA SUSP: 1.0000 | Freq: Two times a day (BID) | NASAL | 1 refills | Status: AC

## 2023-12-03 MED ORDER — FORMOTEROL FUMARATE 20 MCG/2ML IN NEBU: 20.0000 ug | INHALATION_SOLUTION | Freq: Two times a day (BID) | RESPIRATORY_TRACT | 1 refills | Status: AC

## 2023-12-03 MED ORDER — OMEPRAZOLE 40 MG PO CPDR: 40.0000 mg | DELAYED_RELEASE_CAPSULE | Freq: Two times a day (BID) | ORAL | 1 refills | Status: DC

## 2023-12-03 MED ORDER — MONTELUKAST SODIUM 10 MG PO TABS: 10.0000 mg | ORAL_TABLET | Freq: Every evening | ORAL | 1 refills | Status: DC

## 2023-12-03 NOTE — Progress Notes (Unsigned)
 [place] - [place] - [place] - [place] - [place]   Follow-up Note  Referring Provider: Joshua Domino, DO Primary Provider: Jerrie Gathers, DO Date of Office Visit: 12/03/2023  Subjective:   [name] (DOB: August 14, 1975) is a 48 y.o. female [facility] in re-evaluation of the following:  HPI: [name] staes that she has nightly cough, shortness of breath, and wheezing. Patient was hospitalized from 07/26/23 for multifocal CAP w/ positive strep UAg and treated with 7 days of levaquin . She has not had a week without coughing and shortness of breath at night for several months. The patient uses symbicort  and spiriva  twice daily and also uses nebulized albuterol  followed by budesonide  2-3 times per day. This helps her symptoms. She endorses some intermittent nasal congestion and rhinorrhea, but no recent or acute changes. The patient uses nasal spray twice daily for this. She denies recent sick contacts.  Her reflux is well controlled. Patient did have a bout of low BP and was seen by ED and subsequently a cardiologist for PVC's.  [name]'s hand irritation is well controlled with PRN mometasone  cream.  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  Allergies as of 12/03/2023       Reactions   Tramadol Hcl Itching, Nausea And Vomiting   Causes SEIZURES   Aspirin Nausea And Vomiting   Due to Stomach ulcer   Ibuprofen Other (See Comments)   Due to stomach ulcer   Penicillins Itching, Swelling, Rash   Breakout    Pseudoephedrine Itching, Swelling   Tongue swelling   Sulfa Antibiotics Nausea And Vomiting   Acetaminophen  Other (See Comments)   elevated LFT's   Guaifenesin  Nausea And Vomiting   Latex Itching, Rash   Rosemary Oil Swelling, Rash   Lips swell        Medication List    Accu-Chek Guide Test test strip Generic drug: glucose blood Use as instructed    acetaminophen  325 MG tablet Commonly known as: TYLENOL  Take 325-650 mg by mouth every 4 (four) hours as needed for moderate pain (pain score 4-6).   albuterol  (2.5 MG/3ML) 0.083% nebulizer solution Commonly known as: PROVENTIL  Take 3 mLs (2.5 mg total) by nebulization every 4 (four) hours as needed for wheezing or shortness of breath. 1 vial via nebulizer every 4-6 hours as needed.   Ventolin  HFA 108 (90 Base) MCG/ACT inhaler Generic drug: albuterol  Inhale 2 puffs into the lungs every 4 (four) hours as needed for wheezing or shortness of breath.   ammonium lactate  12 % cream Commonly known as: AMLACTIN Apply 1 Application topically as needed for dry skin.   azelastine  0.1 % nasal spray Commonly known as: ASTELIN  Place 2 sprays into both nostrils 2 (two) times daily.   BENEFIBER DRINK MIX PO Take 1 Scoop by mouth 3 (three) times a week.   benztropine  1 MG tablet Commonly known as: COGENTIN  Take 1 tablet (1 mg total) by mouth 2 (two) times daily.   budesonide  0.5 MG/2ML nebulizer solution Commonly known as: PULMICORT  Take 2 mLs (0.5 mg total) by nebulization every 4 (four) hours as needed.   budesonide -formoterol  160-4.5 MCG/ACT inhaler Commonly known as: SYMBICORT  Inhale 2 puffs into the lungs 2 (two) times daily. Use with spacer and mask.   carboxymethylcellulose 0.5 % Soln Commonly known as: REFRESH PLUS Place 1 drop into both eyes 4 (four) times daily as needed (for dry eyes).   cetirizine  10 MG  tablet Commonly known as: ZYRTEC  Take 1 tablet (10 mg total) by mouth daily as needed for allergies (Can use an extra dose during flares).   cycloSPORINE  modified 100 MG Caps Take 100 mg by mouth 2 (two) times daily.   divalproex  500 MG 24 hr tablet Commonly known as: DEPAKOTE  ER Take 2 tablets (1,000 mg total) by mouth at bedtime.   doxepin  25 MG capsule Commonly known as: SINEQUAN  Take 1 capsule (25 mg total) by mouth at bedtime.   fluticasone  110 MCG/ACT  inhaler Commonly known as: FLOVENT  HFA Inhale 2 puffs into the lungs 2 (two) times daily. Uses - as needed for asthma flares   fluticasone  50 MCG/ACT nasal spray Commonly known as: FLONASE  Place 1 spray into both nostrils in the morning and at bedtime.   levothyroxine  75 MCG tablet Commonly known as: SYNTHROID  TAKE 1 TABLET(75 MCG) BY MOUTH EVERY MORNING 30 MINUTES BEFORE FOOD   metoprolol  succinate 25 MG 24 hr tablet Commonly known as: Toprol  XL Take 1 tablet (25 mg total) by mouth daily.   metoprolol  tartrate 100 MG tablet Commonly known as: Lopressor  Take 1 tablet (100 mg total) by mouth once for 1 dose. Take 90-120 minutes prior to scan.   mometasone  0.1 % cream Commonly known as: ELOCON  Apply topically 2 (two) times daily as needed. Can use on skin one to two times daily if needed.   montelukast  10 MG tablet Commonly known as: SINGULAIR  TAKE 1 TABLET(10 MG) BY MOUTH AT BEDTIME   multivitamin tablet Take 2 tablets by mouth daily. 9 am.   Nebulizer/Tubing/Mouthpiece Kit 2 each by Does not apply route as directed.   Nebulizers Misc 1 kit by Does not apply route as directed.   omeprazole  40 MG capsule Commonly known as: PRILOSEC TAKE 1 CAPSULE(40 MG) BY MOUTH TWICE DAILY   polyethylene glycol 17 g packet Commonly known as: MIRALAX  / GLYCOLAX  Take 17 g by mouth 2 (two) times daily. In water or juice   prazosin  2 MG capsule Commonly known as: MINIPRESS  Take 1 capsule (2 mg total) by mouth at bedtime.   RA Vitamin D -3 50 MCG (2000 UT) Caps Generic drug: Cholecalciferol  Take 1 capsule (2,000 Units total) by mouth daily.   risperiDONE  3 MG disintegrating tablet Commonly known as: RISPERDAL  M-TABS Take 1.5-3 mg by mouth See admin instructions. Take 1.5 mg in the morning and 3 mg at night   Spacer/Aero-Hold Chamber Mask Misc Use as directed with inhaler.   Spiriva  Respimat 1.25 MCG/ACT Aers Generic drug: Tiotropium Bromide Monohydrate  Inhale 2 puffs into the  lungs in the morning.   ursodiol  500 MG tablet Commonly known as: ACTIGALL  Take 500 mg by mouth 3 (three) times daily.    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 outlaw   History of Bell's palsy 11/2018   left side   History of gastric ulcer    IDA (iron  deficiency anemia)    Immunosuppression due to drug therapy (HCC)    Inflammatory dermatosis    hands   Intellectual disability    developmental  delay   Liver transplant recipient [facility]) 06/2013   followed by [name]--- Dr Landry Quale;   for ESLD secondary PBC   Morbid obesity (HCC) 07/25/2006   Qualifier: Diagnosis of   By: LANEY MD, JOSEPH         Multifocal pneumonia 07/26/2023   OA (osteoarthritis)    OSA (obstructive sleep apnea)    study many years ago,  no cpap  (study available)   Perennial allergic rhinitis    PMB (postmenopausal bleeding)    PONV (postoperative nausea and vomiting)    Primary biliary cirrhosis (HCC) 2012   s/p  liver transplant in 2015   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 burnette   ESOPHAGOGASTRODUODENOSCOPY (EGD) WITH PROPOFOL  N/A 10/01/2012   Procedure: ESOPHAGOGASTRODUODENOSCOPY (EGD) WITH PROPOFOL ;  Surgeon: Elsie burnette, MD;  Location: [facility];  Service: Endoscopy;  Laterality: N/A;   IR US  LIVER BIOPSY  04/24/2011   @ [facility]   LAPAROSCOPIC CHOLECYSTECTOMY  12/29/2010   @ [facility] by dr d. vernetta;   AND LAPAROSCOPIC APPENDECTOMY   LIVER TRANSPLANT  07/11/2012   @ [name]   ;   Orthotopic   STRABISMUS SURGERY     CHILD   TYMPANOSTOMY TUBE PLACEMENT Bilateral    CHILD    Review of systems negative except as noted in HPI / PMHx or noted below:  Review of Systems  Constitutional:  Negative for chills and fever.  HENT:  Positive for congestion. Negative for ear pain and sore throat.        Rhinorrhea, intermittent  Respiratory:  Positive for cough and shortness of breath.        Wheezing, night time predominant  Cardiovascular:  Negative for chest pain and palpitations.  Gastrointestinal:  Negative for constipation, nausea and vomiting.  Skin:        Bilateral hand irritation, waxes and wanes  Endo/Heme/Allergies:  Positive for environmental allergies.     Objective:   Vitals:   12/03/23 1334  BP: (!) 128/98  Pulse: 91  Resp: 18  Temp: (!) 97.5 F (36.4 C)  SpO2: 97%   Height: 4' 11 (149.9 cm)  Weight: 235 lb 8 oz (106.8 kg)   Physical Exam Constitutional:      General: She is not in acute distress.    Appearance: Normal appearance.  HENT:     Head: Normocephalic.     Nose: Nose normal.  Eyes:     Comments: R eye strabismus  Cardiovascular:     Rate and Rhythm: Normal rate and regular rhythm.  Pulmonary:     Effort: Pulmonary effort is normal.     Breath sounds: Normal breath sounds. No wheezing.  Abdominal:     Palpations: Abdomen is soft.  Musculoskeletal:        General: Normal range of motion.     Cervical back: Normal range of motion.  Skin:    Comments: Bilateral palmar dryness with slightly ashen skin borders to the fingers as well. See photos below  Neurological:     Mental  Status: She is alert. Mental status is at baseline.     Gait: Gait abnormal (uses a  walker for ambulation assistance).  Psychiatric:        Mood and Affect: Mood normal.        Behavior: Behavior normal.        Diagnostics: Spirometry was performed and demonstrated an FEV1 of 1.76 at 87.56% of predicted.  Results of blood tests obtained 28 October 2023 identifies WBC 3.4, hemoglobin 10.4, platelet 158, creatinine 1.3, AST 17U/L, ALT 17U/L,  Results of a head CT scan obtained 22 July 2023 identified the following:  Sinuses/Orbits: The visualized paranasal sinuses are essentially clear. The mastoid air cells are unopacified.  Results of a chest CT scan obtained 22 July 2023 identifies the following:  Cardiovascular: Normal heart size. No pericardial effusions. Normal caliber thoracic aorta. Mild aortic calcification.   Mediastinum/Nodes: Esophagus is decompressed. No significant lymphadenopathy. Thyroid  gland is unremarkable.   Lungs/Pleura: Dense consolidation in the right lower lung with patchy infiltrates throughout the remainder of the lungs bilaterally. Changes are likely to represent multifocal pneumonia. Follow-up after resolution of the acute process is recommended to exclude any underlying focal or obstructing lesions. No pleural effusion or pneumothorax.   Assessment and Plan:   1. Not well controlled moderate persistent asthma   2. History of pneumonia   3. Immunosuppression (HCC)   4. Perennial allergic rhinitis   5. Seasonal allergic rhinitis due to pollen   6. LPRD (laryngopharyngeal reflux disease)   7. Inflammatory dermatosis    1.  Start the following neb medications:   A. Budesonide  0.5 - nebulize 2 - 4 times per day  B. Formoterol  - nebulize 2 times per day  C. Discontinue Symbicort   2.  Continue Spiriva  1.25 Respimat - 2 inhalations 1 time per day  3.  Continue montelukast  10 mg one tablet once a day  4. Continue nasal Azelastine  and fluticasone  1 spray each nostril twice a day  5. Continue cetirizine  10 mg one tablet  once a day  6. Continue Omeprazole  40 mg 2 times per day + famotidine  40 mg  7. If needed:  A. Mometasone  0.1% cream 1-2 times a day if needed  B. Can add albuterol  to budesonide  up to 4 times per day  8. Repeat a non contrast chest CT scan for history of pneumonia  9. Return to clinic in 6 months or earlier if problem  Donnice Mutter, MS4 [facility] of Medicine  I have provided oversight and direction for Donnice Mutter, [facility] medical student, concerning the care of Davionna Meban during today's clinic visit.  In review, Evora is a 48 year old female with a history of asthma and reflux induced respiratory disease who is chronically immunosuppressed with both mycophenolate  and cyclosporine  for her liver transplant who has continued respiratory tract symptoms that appear to have developed ever since she developed a pneumonia earlier this spring.  To help with distribution of anti-inflammatory agents for her airway we will switch her from Symbicort  to nebulized budesonide  and formoterol  and we will further evaluate resolution of her pneumonia by obtaining a repeat chest CT scan.  She will remain on other anti-inflammatory agents for her airway and continue to aggressively treat her reflux induced respiratory disease as noted above.  We will contact her with the results of her chest CT scan once it is available for review.  Camellia Denis, MD Allergy / Immunology [facility]

## 2023-12-03 NOTE — Patient Instructions (Addendum)
  1.  Start the following neb medications:   A. Budesonide  0.5 - nebulize 2 - 4 times per day  B. Formoterol  - nebulize 2 times per day  C. Discontinue Symbicort   2.  Continue Spiriva  1.25 Respimat - 2 inhalations 1 time per day  3.  Continue montelukast  10 mg one tablet once a day  4. Continue nasal Azelastine  and fluticasone  1 spray each nostril twice a day  5. Continue cetirizine  10 mg one tablet once a day  6. Continue Omeprazole  40 mg 2 times per day + famotidine  40 mg  7. If needed:  A. Mometasone  0.1% cream 1-2 times a day if needed  B. Can add albuterol  to budesonide  up to 4 times per day  8. Return to clinic in 6 months or earlier if problem

## 2023-12-03 NOTE — Progress Notes (Signed)
 Reviewed and agree with Dr Macky Lower plan.

## 2023-12-04 ENCOUNTER — Telehealth: Payer: Self-pay | Admitting: Cardiology

## 2023-12-04 ENCOUNTER — Encounter: Payer: Self-pay | Admitting: Cardiology

## 2023-12-04 DIAGNOSIS — I10 Essential (primary) hypertension: Secondary | ICD-10-CM | POA: Diagnosis not present

## 2023-12-04 NOTE — Telephone Encounter (Signed)
 Called to schedule follow up in 4 weeks with APP, unfortunately, we've been unable to LVM's and call cannot be completed. Will send letter via MyChart

## 2023-12-04 NOTE — Telephone Encounter (Signed)
-----   Message from Nurse Geni E sent at 11/20/2023  3:25 PM EDT ----- Regarding: 4 week APP follow up missed at check out Hi, Would someone have a second to call this patient? It looks like her 4 week f/u with an APP was missed. I am dealing with my 3rd urgently symptomatic paitent of the day right now otherwise I'd do it myself! Thanks, Geni, RN

## 2023-12-05 ENCOUNTER — Encounter: Payer: Self-pay | Admitting: Allergy and Immunology

## 2023-12-05 ENCOUNTER — Telehealth: Payer: Self-pay

## 2023-12-05 ENCOUNTER — Other Ambulatory Visit (HOSPITAL_COMMUNITY): Payer: Self-pay

## 2023-12-05 DIAGNOSIS — I10 Essential (primary) hypertension: Secondary | ICD-10-CM | POA: Diagnosis not present

## 2023-12-05 NOTE — Telephone Encounter (Signed)
 Pharmacy Patient Advocate Encounter  Received notification from [facility] that Prior Authorization for Formoterol  Fumarate 20MCG/2ML nebulizer solution  has been APPROVED from 12/05/2023 to 12/04/2024   PA #/Case ID/Reference #: [employer]

## 2023-12-06 DIAGNOSIS — I10 Essential (primary) hypertension: Secondary | ICD-10-CM | POA: Diagnosis not present

## 2023-12-07 DIAGNOSIS — I10 Essential (primary) hypertension: Secondary | ICD-10-CM | POA: Diagnosis not present

## 2023-12-08 DIAGNOSIS — I10 Essential (primary) hypertension: Secondary | ICD-10-CM | POA: Diagnosis not present

## 2023-12-09 ENCOUNTER — Telehealth: Payer: Self-pay

## 2023-12-09 DIAGNOSIS — I10 Essential (primary) hypertension: Secondary | ICD-10-CM | POA: Diagnosis not present

## 2023-12-09 NOTE — Telephone Encounter (Signed)
 Patient calls nurse line asking if she can drink apple cider vinegar water before meals.   She states that her dietician recommended drinking glass of apple cider vinegar water prior to meals, however, wants her to check with her doctor prior to starting.   Patient wants to ensure that this will not have any interactions with her medications.   Will forward to Dr. Koval for further advisement.   Lori JAYSON English, RN

## 2023-12-10 ENCOUNTER — Ambulatory Visit (INDEPENDENT_AMBULATORY_CARE_PROVIDER_SITE_OTHER): Admitting: Audiology

## 2023-12-10 ENCOUNTER — Institutional Professional Consult (permissible substitution) (INDEPENDENT_AMBULATORY_CARE_PROVIDER_SITE_OTHER): Admitting: Otolaryngology

## 2023-12-10 ENCOUNTER — Other Ambulatory Visit (HOSPITAL_COMMUNITY): Payer: Self-pay

## 2023-12-10 ENCOUNTER — Telehealth: Payer: Self-pay

## 2023-12-10 DIAGNOSIS — I10 Essential (primary) hypertension: Secondary | ICD-10-CM | POA: Diagnosis not present

## 2023-12-10 NOTE — Telephone Encounter (Signed)
Prior Authorization is not required at this time. Pharmacy needs to submit override codes for Drug Utilization Review.

## 2023-12-11 DIAGNOSIS — I10 Essential (primary) hypertension: Secondary | ICD-10-CM | POA: Diagnosis not present

## 2023-12-11 NOTE — Telephone Encounter (Signed)
 Patient contacted for follow-up of advice request RE Apple cider vinegar prior to meals and impact on medications.   Since last contact patient reports she has had a small adjustment in her medications with a new inhaler from her asthma doctor.    We discussed that the apple cider vinegar was unlikely to cause any significant interaction with her medication regimen.  Small doses prior to meals would be acceptable.   Total time with patient call and documentation of interaction: 12 minutes.  F/U planned for in-person med update of pill boxes on 12/23/2023

## 2023-12-12 ENCOUNTER — Telehealth: Payer: Self-pay | Admitting: *Deleted

## 2023-12-12 DIAGNOSIS — I10 Essential (primary) hypertension: Secondary | ICD-10-CM | POA: Diagnosis not present

## 2023-12-12 NOTE — Telephone Encounter (Signed)
-----   Message from ERIC J KOZLOW sent at 12/05/2023  7:59 AM EDT ----- Please let [name] know that I took a look at her CT scan she had earlier this spring and we need to obtain a follow-up CT scan to make sure that everything has resolved.  Please arrange for a noncontrast CT scan of chest for the diagnosis of pneumonia.

## 2023-12-12 NOTE — Telephone Encounter (Signed)
 Spoke with [employer] to initiate Prior Auth for Chest CT without contrast. Approval number [id number] 12/12/23- 02/09/24. Called [facility] scheduling at [phone] and I got her scheduled at [facility] for July 24th arrive at 5:15.    Tried calling pt at both numbers on file- left voicemail for a return call.

## 2023-12-12 NOTE — Telephone Encounter (Signed)
Pt has been informed of appointment details. 

## 2023-12-12 NOTE — Telephone Encounter (Signed)
 Spoke with [employer] to initiate Prior Auth for Chest CT without contrast. Approval number [id number] 12/12/23-02/09/24. Called the [facility] scheduling dept at [phone] and I got her scheduled at [facility] Thursday July 24th arrive by 5:15 pm.   I tried calling pt at both phone numbers on file- no answer, left voicemail for a return call.

## 2023-12-12 NOTE — Telephone Encounter (Signed)
 Reviewed and agree with Dr Macky Lower plan.

## 2023-12-13 DIAGNOSIS — I10 Essential (primary) hypertension: Secondary | ICD-10-CM | POA: Diagnosis not present

## 2023-12-14 DIAGNOSIS — I10 Essential (primary) hypertension: Secondary | ICD-10-CM | POA: Diagnosis not present

## 2023-12-15 DIAGNOSIS — I10 Essential (primary) hypertension: Secondary | ICD-10-CM | POA: Diagnosis not present

## 2023-12-16 DIAGNOSIS — I10 Essential (primary) hypertension: Secondary | ICD-10-CM | POA: Diagnosis not present

## 2023-12-17 ENCOUNTER — Telehealth: Payer: Self-pay

## 2023-12-17 ENCOUNTER — Encounter (HOSPITAL_COMMUNITY): Payer: Self-pay

## 2023-12-17 DIAGNOSIS — I10 Essential (primary) hypertension: Secondary | ICD-10-CM | POA: Diagnosis not present

## 2023-12-17 NOTE — Patient Instructions (Signed)
 Cimone Arnell Anes - I am sorry I was unable to reach you today for our scheduled appointment. I work with Bhagat, Virali, DO and am calling to support your healthcare needs. Please contact me at [phone] at your earliest convenience. I look forward to speaking with you soon.   Thank you,   Wilbert Diver RN, BSN, [facility] [place]  [facility], [facility]    Care Coordinator Phone: [phone]

## 2023-12-18 ENCOUNTER — Telehealth (HOSPITAL_COMMUNITY): Payer: Self-pay | Admitting: Emergency Medicine

## 2023-12-18 DIAGNOSIS — I10 Essential (primary) hypertension: Secondary | ICD-10-CM | POA: Diagnosis not present

## 2023-12-18 NOTE — Telephone Encounter (Signed)
 Unable to leave vm Rockwell Alexandria RN Navigator Cardiac Imaging [facility] [phone] Office  [phone] Cell

## 2023-12-19 ENCOUNTER — Ambulatory Visit (HOSPITAL_COMMUNITY)

## 2023-12-19 ENCOUNTER — Ambulatory Visit (HOSPITAL_COMMUNITY)
Admission: RE | Admit: 2023-12-19 | Discharge: 2023-12-19 | Disposition: A | Discharge status: Home / Self Care | Source: Ambulatory Visit | Attending: Cardiology | Admitting: Cardiology

## 2023-12-19 DIAGNOSIS — R079 Chest pain, unspecified: Secondary | ICD-10-CM | POA: Insufficient documentation

## 2023-12-19 DIAGNOSIS — I10 Essential (primary) hypertension: Secondary | ICD-10-CM | POA: Diagnosis not present

## 2023-12-19 MED ORDER — NITROGLYCERIN 0.4 MG SL SUBL
0.8000 mg | SUBLINGUAL_TABLET | Freq: Once | SUBLINGUAL | Status: AC
  Administered 2023-12-19: 0.8 mg via SUBLINGUAL

## 2023-12-19 MED ORDER — IOHEXOL 350 MG/ML SOLN
100.0000 mL | Freq: Once | INTRAVENOUS | Status: AC | PRN
  Administered 2023-12-19: 100 mL via INTRAVENOUS

## 2023-12-20 ENCOUNTER — Encounter: Payer: Self-pay | Admitting: Cardiology

## 2023-12-20 DIAGNOSIS — I10 Essential (primary) hypertension: Secondary | ICD-10-CM | POA: Diagnosis not present

## 2023-12-20 DIAGNOSIS — I700 Atherosclerosis of aorta: Secondary | ICD-10-CM | POA: Insufficient documentation

## 2023-12-20 DIAGNOSIS — I2510 Atherosclerotic heart disease of native coronary artery without angina pectoris: Secondary | ICD-10-CM | POA: Insufficient documentation

## 2023-12-21 DIAGNOSIS — I10 Essential (primary) hypertension: Secondary | ICD-10-CM | POA: Diagnosis not present

## 2023-12-22 DIAGNOSIS — I10 Essential (primary) hypertension: Secondary | ICD-10-CM | POA: Diagnosis not present

## 2023-12-23 ENCOUNTER — Encounter: Payer: Self-pay | Admitting: Pharmacist

## 2023-12-23 ENCOUNTER — Ambulatory Visit (INDEPENDENT_AMBULATORY_CARE_PROVIDER_SITE_OTHER): Admitting: Pharmacist

## 2023-12-23 VITALS — Wt 235.0 lb

## 2023-12-23 DIAGNOSIS — I10 Essential (primary) hypertension: Secondary | ICD-10-CM | POA: Diagnosis not present

## 2023-12-23 DIAGNOSIS — F79 Unspecified intellectual disabilities: Secondary | ICD-10-CM | POA: Diagnosis not present

## 2023-12-23 MED ORDER — ROSUVASTATIN CALCIUM 10 MG PO TABS: 10.0000 mg | ORAL_TABLET | Freq: Every day | ORAL | 3 refills | Status: DC

## 2023-12-23 NOTE — Patient Instructions (Signed)
 Great to see you again - glad to hear the new pill boxes are working well for you.    Medication Changes: None - Urosodiol at NOON  Cyclosporin at 4:00 PM Please pickup refills for the following medications prior to your next visit/refill     Refills of medications need for next fill include: Benztropine  montelukast  Ursodiol 

## 2023-12-23 NOTE — Assessment & Plan Note (Signed)
 Polypharmacy - Medication Reconciliation - Medication list reviewed and updated.  However, lack of adequate supply of both ursodiol  AND benztropine  (requested for fill at last visit) I was only able to fill 9 days with all medications.  Understanding of regimen: good  Understanding of indications: good  Potential of compliance: good - with use of pill boxes Patient continues taking medications a four different time per day. This 4x per day dosing is due to the addition of Actigal (ursodiol ) TID - mid day dose taken at lunch and preference to take cyclosporin at 4:00 PM.   Three full weeks of medications were filled into boxes.  Patient has known adherence challenges including lack of knowledge, vision loss.   Patient will return in 1 week on Monday for planned completion of fill for two additional weeks supply.     Written patient instructions and updated medication list provided.  Refills of medications need for next visit fill completion include: I contacted patient's pharmacy to ensure adequate supply of refills for completion of fill. Benztropine  Ursodiol   I also requested montelukast  as this will be needed in 2-3 weeks for next fill.

## 2023-12-23 NOTE — Progress Notes (Signed)
 S:     Chief Complaint  Patient presents with   Medication Management    Medication Box Refill   48 y.o. female who for medication management due to concern of polypharmacy and need for medication adherence monitoring and assistance.  Due to transplant medication - not appropriate for automated pill pack.     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 Madelon on 11/25/2023.    Patient arrives in good spirits and presents with assistance of walker.  She arrived today with multiple medications for fill however she did NOT have two medications adequate to fill 3 week supply.  During this visit we filled a 9 DAYsupply of medication.  With week number two missing ursodiol  for 5 days. Week number three was missing ursodiol  AND PM dose of benztropine .    At last visit metoprolol  succinate 25mg  once daily was added to medication regimen and propranolol  was discontinued. (Propranolol  was removed from patient possession and sent for destruction). Iron  (ferrous sulfate ) has been discontinued but supply remains in patient possession if needed in future. Patient aware that she should not use the iron  at this time.     Insurance Coverage: Medicaid  Do you know what each of your medicines is for? Yes Do you feel that your medications are working for you? yes Have you been experiencing any side effects to the medications prescribed? no   Due to confusion related to medication shortages of requested medications and need to clarify how to use current supply we had to abbreviate visit due to ride share arriving prior to use completing all typical visit tasks (vitals and non-pill box medication review).    O:   Review of Systems  All other systems reviewed and are negative.   Physical Exam Vitals reviewed.  Pulmonary:     Effort: Pulmonary effort is normal.  Neurological:     Mental Status: She  is alert.  Psychiatric:        Mood and Affect: Mood normal.        Behavior: Behavior normal.     Lab Results  Component Value Date   HGBA1C 5.7 (A) 11/11/2023    Lipid Panel     Component Value Date/Time   CHOL 226 (H) 11/11/2023 0921   TRIG 282 (H) 11/11/2023 0921   HDL 44 11/11/2023 0921   CHOLHDL 5.1 (H) 11/11/2023 0921   CHOLHDL 6.0 (H) 04/28/2015 0928   VLDL 47 (H) 04/28/2015 0928   LDLCALC 132 (H) 11/11/2023 0921   LDLDIRECT 64 07/10/2011 1115    Clinical Atherosclerotic Cardiovascular Disease (ASCVD):  The 10-year ASCVD risk score (Arnett DK, et al., 2019) is: 5.8%   Values used to calculate the score:     Age: 48 years     Clincally relevant sex: Female     Is Non-Hispanic African American: Yes     Diabetic: No     Tobacco smoker: No     Systolic Blood Pressure: 133 mmHg     Is BP treated: Yes     HDL Cholesterol: 44 mg/dL     Total Cholesterol: 226 mg/dL   Patient is participating in a Managed Medicaid Plan:  Yes   A/P: Polypharmacy - Medication Reconciliation - Medication list reviewed and updated.  However, lack of adequate supply of both ursodiol  AND benztropine  (requested for fill at last visit) I was only able to fill  9 days with all medications.  Understanding of regimen: good  Understanding of indications: good  Potential of compliance: good - with use of pill boxes Patient continues taking medications a four different time per day. This 4x per day dosing is due to the addition of Actigal (ursodiol ) TID - mid day dose taken at lunch and preference to take cyclosporin at 4:00 PM.   Three full weeks of medications were filled into boxes.  Patient has known adherence challenges including lack of knowledge, vision loss.   Patient will return in 1 week on Monday for planned completion of fill for two additional weeks supply.     Written patient instructions and updated medication list provided.  Refills of medications need for next visit fill completion  include: I contacted patient's pharmacy to ensure adequate supply of refills for completion of fill. Benztropine  Ursodiol   I also requested montelukast  as this will be needed in 2-3 weeks for next fill.   Written patient instructions provided. Patient verbalized understanding of treatment plan.  Total time in face to face counseling 71 minutes.    Follow-up:  Pharmacist 1 week to finish fill - to be scheduled by phone due to ride arriving for patient pick-up we did not complete while in-person.  Attempted to contact multiple times after visit in PM - message left requesting reschedule on 12/30/2023  PCP clinic visit in 01/02/2024   I am willing to see her during the 10:00 AM work-in / same day slot OR in the early PM (at as an 11:00 AM appt with late arrival note).

## 2023-12-23 NOTE — Telephone Encounter (Signed)
-----   Message from Wilbert Bihari sent at 12/20/2023  2:40 PM EDT ----- Noncardiac portion of coronary CT showed aortic atherosclerosis but otherwise normal ----- Message ----- From: Interface, Rad Results In Sent: 12/19/2023   4:56 PM EDT To: Wilbert JONELLE Bihari, MD

## 2023-12-23 NOTE — Telephone Encounter (Signed)
 Call to patient to discuss coronary CTA results, no answer, no VM picks up. [facility] sent. Orders placed for crestor  10 mg daily, FLP/ALT in 6 weeks. Dr. Shlomo requesting f/u appt in 4 weeks when patient was seen 11/20/23, booked appt for 01/14/24 at 8:50 AM.

## 2023-12-24 ENCOUNTER — Other Ambulatory Visit (HOSPITAL_COMMUNITY)

## 2023-12-24 DIAGNOSIS — I10 Essential (primary) hypertension: Secondary | ICD-10-CM | POA: Diagnosis not present

## 2023-12-24 NOTE — Progress Notes (Signed)
 Reviewed and agree with Dr Macky Lower plan.

## 2023-12-25 DIAGNOSIS — I10 Essential (primary) hypertension: Secondary | ICD-10-CM | POA: Diagnosis not present

## 2023-12-26 DIAGNOSIS — I10 Essential (primary) hypertension: Secondary | ICD-10-CM | POA: Diagnosis not present

## 2023-12-27 DIAGNOSIS — I10 Essential (primary) hypertension: Secondary | ICD-10-CM | POA: Diagnosis not present

## 2023-12-28 DIAGNOSIS — I10 Essential (primary) hypertension: Secondary | ICD-10-CM | POA: Diagnosis not present

## 2023-12-29 DIAGNOSIS — I10 Essential (primary) hypertension: Secondary | ICD-10-CM | POA: Diagnosis not present

## 2023-12-30 ENCOUNTER — Ambulatory Visit: Admitting: Pharmacist

## 2023-12-30 ENCOUNTER — Encounter: Payer: Self-pay | Admitting: Pharmacist

## 2023-12-30 VITALS — BP 136/90 | HR 77 | Wt 233.6 lb

## 2023-12-30 DIAGNOSIS — I10 Essential (primary) hypertension: Secondary | ICD-10-CM | POA: Diagnosis not present

## 2023-12-30 DIAGNOSIS — Z789 Other specified health status: Secondary | ICD-10-CM

## 2023-12-30 NOTE — Progress Notes (Signed)
 Reviewed and agree with Dr Macky Lower plan.

## 2023-12-30 NOTE — Assessment & Plan Note (Signed)
 Polypharmacy - Medication Reconciliation - Medication list reviewed and updated.  Today, I was able to complete medication fill for 2 additional weeks.  Understanding of regimen: good  Understanding of indications: good  Potential of compliance: good - with use of pill boxes Patient continues taking medications a four different time per day.  Patient has known adherence challenges including lack of knowledge, vision loss.   Patient will return in 10 days for next completion of medication fill for three.

## 2023-12-30 NOTE — Patient Instructions (Signed)
 Great to see you again - glad to hear the new pill boxes are working well for you.    Medication Changes: None - Urosodiol at NOON  Cyclosporin at 4:00 PM Please pickup refills for the following medications prior to your next visit/refill     Refills of medications need for next fill include: montelukast  Vitamin D  tablet

## 2023-12-30 NOTE — Progress Notes (Signed)
    S:     Chief Complaint  Patient presents with   Medication Management    Medication Supply - Med Box fill (1 week due to inadequate supply last week)   48 y.o. who presents for medication management due to concern of polypharmacy and need for medication adherence monitoring and assistance.  Due to transplant medication - not appropriate for automated pill pack.     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 Madelon on 11/25/2023.  She was seen 1 week ago but did not have adequate supply of two medications and was rescheduled for this week to complete additional weeks of medication fill.    Patient arrives in good spirits and presents with assistance of walker.  She arrived today with needs medications to complete fill for two more weeks.    Insurance Coverage: Medicaid  Do you know what each of your medicines is for? Yes Do you feel that your medications are working for you? yes Have you been experiencing any side effects to the medications prescribed? no      O:  Review of Systems  All other systems reviewed and are negative.   Physical Exam Vitals reviewed.  Pulmonary:     Effort: Pulmonary effort is normal.  Neurological:     Mental Status: She is alert.  Psychiatric:        Mood and Affect: Mood normal.        Behavior: Behavior normal.        Thought Content: Thought content normal.        Judgment: Judgment normal.    Vitals:   12/30/23 1019 12/30/23 1024  BP: (!) 133/91 (!) 136/90  Pulse: 77   SpO2: 100%    A/P: Polypharmacy - Medication Reconciliation - Medication list reviewed and updated.  Today, I was able to complete medication fill for 2 additional weeks.  Understanding of regimen: good  Understanding of indications: good  Potential of compliance: good - with use of pill boxes Patient continues taking medications a four different time per day.   Patient has known adherence challenges including lack of knowledge, vision loss.   Patient will return in 10 days for next completion of medication fill for three.       Written patient instructions and updated medication list provided.  Refills of medications need for next visit fill completion include: Montelukast  and vitamin D   Follow up pharmacist visit 01/09/2024 PCP Clinic Visit 01/02/2024 Patient seen with Dr. Tharon, MD

## 2023-12-31 DIAGNOSIS — I10 Essential (primary) hypertension: Secondary | ICD-10-CM | POA: Diagnosis not present

## 2024-01-01 DIAGNOSIS — I10 Essential (primary) hypertension: Secondary | ICD-10-CM | POA: Diagnosis not present

## 2024-01-02 ENCOUNTER — Ambulatory Visit

## 2024-01-02 DIAGNOSIS — I10 Essential (primary) hypertension: Secondary | ICD-10-CM | POA: Diagnosis not present

## 2024-01-03 DIAGNOSIS — I10 Essential (primary) hypertension: Secondary | ICD-10-CM | POA: Diagnosis not present

## 2024-01-04 DIAGNOSIS — I10 Essential (primary) hypertension: Secondary | ICD-10-CM | POA: Diagnosis not present

## 2024-01-05 DIAGNOSIS — I10 Essential (primary) hypertension: Secondary | ICD-10-CM | POA: Diagnosis not present

## 2024-01-06 ENCOUNTER — Other Ambulatory Visit: Payer: Self-pay | Admitting: Allergy and Immunology

## 2024-01-06 ENCOUNTER — Ambulatory Visit (HOSPITAL_COMMUNITY)
Admission: RE | Admit: 2024-01-06 | Discharge: 2024-01-06 | Disposition: A | Discharge status: Home / Self Care | Source: Ambulatory Visit | Attending: Cardiology | Admitting: Cardiology

## 2024-01-06 DIAGNOSIS — I10 Essential (primary) hypertension: Secondary | ICD-10-CM | POA: Diagnosis not present

## 2024-01-06 DIAGNOSIS — R079 Chest pain, unspecified: Secondary | ICD-10-CM | POA: Insufficient documentation

## 2024-01-06 LAB — ECHOCARDIOGRAM COMPLETE
Area-P 1/2: 4.68 cm2
P 1/2 time: 317 ms
S' Lateral: 2.9 cm

## 2024-01-07 ENCOUNTER — Telehealth: Payer: Self-pay | Admitting: Allergy and Immunology

## 2024-01-07 ENCOUNTER — Encounter: Payer: Self-pay | Admitting: Cardiology

## 2024-01-07 DIAGNOSIS — I351 Nonrheumatic aortic (valve) insufficiency: Secondary | ICD-10-CM | POA: Insufficient documentation

## 2024-01-07 DIAGNOSIS — I10 Essential (primary) hypertension: Secondary | ICD-10-CM | POA: Diagnosis not present

## 2024-01-07 NOTE — Telephone Encounter (Signed)
 [name] called and stated that she tried to get a refill for her prescription for budesonide  (PULMICORT ) 0.5 MG/2ML nebulizer solution [[id number]], and the pharmacy stated that prescription was expired and she needed to reach out to our office.

## 2024-01-08 DIAGNOSIS — I10 Essential (primary) hypertension: Secondary | ICD-10-CM | POA: Diagnosis not present

## 2024-01-09 ENCOUNTER — Ambulatory Visit: Admitting: Pharmacist

## 2024-01-09 ENCOUNTER — Encounter: Payer: Self-pay | Admitting: Pharmacist

## 2024-01-09 VITALS — BP 138/90 | HR 106 | Wt 237.0 lb

## 2024-01-09 DIAGNOSIS — F819 Developmental disorder of scholastic skills, unspecified: Secondary | ICD-10-CM | POA: Diagnosis not present

## 2024-01-09 DIAGNOSIS — I10 Essential (primary) hypertension: Secondary | ICD-10-CM | POA: Diagnosis not present

## 2024-01-09 NOTE — Progress Notes (Signed)
    S:     Chief Complaint  Patient presents with   Medication Management    Medication Review and Pill Box Fill   48 y.o. who presents for medication management due to concern of polypharmacy and need for medication adherence monitoring and assistance.  Due to transplant medication - not appropriate for automated pill pack.     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 Madelon on 11/25/2023.    Patient arrives in good spirits and presents with assistance of walker.  She arrived today with medication supply adequate for complete fill of 3 week supply.    Patient has both metoprolol  succinate 25mg  once daily and was also recently dispensed propranolol  which should be discontinued.  Iron  (ferrous sulfate ) has been discontinued but supply remains in patient possession if needed in future.    Insurance Coverage: Medicaid  Do you know what each of your medicines is for? Yes Do you feel that your medications are working for you? yes Have you been experiencing any side effects to the medications prescribed? no     O:  Review of Systems  All other systems reviewed and are negative.   Physical Exam Vitals reviewed.  Constitutional:      Appearance: Normal appearance.  Pulmonary:     Effort: Pulmonary effort is normal.  Neurological:     Mental Status: She is alert.  Psychiatric:        Mood and Affect: Mood normal.        Behavior: Behavior normal.        Thought Content: Thought content normal.        Judgment: Judgment normal.    Vitals:   01/09/24 1344  BP: (!) 138/90  Pulse: (!) 106  SpO2: 99%    A/P: Polypharmacy - Medication Reconciliation - Medication list reviewed and updated for patient with intellectual disability AND history of liver transplant.   Understanding of regimen: good  Understanding of indications: good  Potential of compliance: good with use of pill  box - taking 4 different times per day.  Three full weeks of medications were filled into boxes.  Patient has known adherence challenges including lack of knowledge, vision loss.   Patient will return in 3 weeks on Thursday for planned refill of up to 3 weeks supply.    Written patient instructions and updated medication list provided. Total time in face to face counseling 39 minutes.    Follow up pharmacist visit 3 weeks 01/30/2024 PCP Clinic Visit TBD Patient seen with Fonda Blase, PharmD Candidate - PY3 student and Calton Nash, PharmD Candidate - PY4 student.

## 2024-01-09 NOTE — Patient Instructions (Addendum)
 As always, great to see you again - the new pill boxes seem to be working well for you.    Medication Changes:  None - Urosodiol at NOON  Cyclosporin at 4:00 PM Please pickup refills for the following medications prior to your next visit/refill     Refills of medications need for next fill include: Benztropine  1mg  Doxepin  25mg  Metoprolol  Succinate 25mg   Prazosin  2mg 

## 2024-01-09 NOTE — Assessment & Plan Note (Signed)
 Polypharmacy - Medication Reconciliation - Medication list reviewed and updated for patient with intellectual disability AND history of liver transplant.   Understanding of regimen: good  Understanding of indications: good  Potential of compliance: good with use of pill box - taking 4 different times per day.  Three full weeks of medications were filled into boxes.  Patient has known adherence challenges including lack of knowledge, vision loss.   Patient will return in 3 weeks on Thursday for planned refill of up to 3 weeks supply.

## 2024-01-10 ENCOUNTER — Other Ambulatory Visit: Payer: Self-pay | Admitting: *Deleted

## 2024-01-10 DIAGNOSIS — I10 Essential (primary) hypertension: Secondary | ICD-10-CM | POA: Diagnosis not present

## 2024-01-10 NOTE — Progress Notes (Signed)
 Reviewed and agree with Dr Rennis plan.

## 2024-01-10 NOTE — Telephone Encounter (Signed)
 RX was sent on 8/13.

## 2024-01-11 DIAGNOSIS — I10 Essential (primary) hypertension: Secondary | ICD-10-CM | POA: Diagnosis not present

## 2024-01-12 DIAGNOSIS — I10 Essential (primary) hypertension: Secondary | ICD-10-CM | POA: Diagnosis not present

## 2024-01-13 DIAGNOSIS — I10 Essential (primary) hypertension: Secondary | ICD-10-CM | POA: Diagnosis not present

## 2024-01-14 DIAGNOSIS — I10 Essential (primary) hypertension: Secondary | ICD-10-CM | POA: Diagnosis not present

## 2024-01-15 DIAGNOSIS — I10 Essential (primary) hypertension: Secondary | ICD-10-CM | POA: Diagnosis not present

## 2024-01-16 ENCOUNTER — Telehealth: Payer: Self-pay

## 2024-01-16 DIAGNOSIS — G40909 Epilepsy, unspecified, not intractable, without status epilepticus: Secondary | ICD-10-CM

## 2024-01-16 DIAGNOSIS — Z944 Liver transplant status: Secondary | ICD-10-CM

## 2024-01-16 DIAGNOSIS — I10 Essential (primary) hypertension: Secondary | ICD-10-CM | POA: Diagnosis not present

## 2024-01-16 NOTE — Progress Notes (Signed)
 Complex Care Management Note  Care Guide Note 01/16/2024 Name: [name] MRN: [id number] DOB: 21-Feb-1976  [name] is a 48 y.o. year old female who sees Waverly, Mulliken, DO for primary care. I reached out to [employer] by phone today to offer complex care management services.  [name] was given information about Complex Care Management services today including:   The Complex Care Management services include support from the care team which includes your Nurse Care Manager, Clinical Social Worker, or Pharmacist.  The Complex Care Management team is here to help remove barriers to the health concerns and goals most important to you. Complex Care Management services are voluntary, and the patient may decline or stop services at any time by request to their care team member.   Complex Care Management Consent Status: Patient agreed to services and verbal consent obtained.   Follow up plan:  Telephone appointment with complex care management team member scheduled for:  01/29/2024  Encounter Outcome:  Patient Scheduled  Jeoffrey Buffalo , RMA     [place]  [facility], [facility] Guide  Direct Dial: [phone]  Website: [email or web address]

## 2024-01-17 DIAGNOSIS — I10 Essential (primary) hypertension: Secondary | ICD-10-CM | POA: Diagnosis not present

## 2024-01-18 DIAGNOSIS — I10 Essential (primary) hypertension: Secondary | ICD-10-CM | POA: Diagnosis not present

## 2024-01-19 DIAGNOSIS — I10 Essential (primary) hypertension: Secondary | ICD-10-CM | POA: Diagnosis not present

## 2024-01-20 DIAGNOSIS — I10 Essential (primary) hypertension: Secondary | ICD-10-CM | POA: Diagnosis not present

## 2024-01-20 NOTE — Telephone Encounter (Signed)
-----   Message from Wilbert Bihari sent at 01/07/2024  1:38 PM EDT ----- 2D echo showed low normal LVF EF 50-55% with increased stiffness of heart muscle called diastolic dysfunction, mildly enlarged LA and mildly leaky AV ----- Message ----- From: Interface, Three One Seven Sent: 01/06/2024   7:44 PM EDT To: Wilbert JONELLE Bihari, MD

## 2024-01-20 NOTE — Telephone Encounter (Signed)
 Call to patient to advise that 2D echo showed low normal LVF EF 50-55% with increased stiffness of heart muscle called diastolic dysfunction, mildly enlarged LA and mildly leaky AV. Patient verbalizes understanding.

## 2024-01-21 DIAGNOSIS — I10 Essential (primary) hypertension: Secondary | ICD-10-CM | POA: Diagnosis not present

## 2024-01-22 DIAGNOSIS — I10 Essential (primary) hypertension: Secondary | ICD-10-CM | POA: Diagnosis not present

## 2024-01-23 DIAGNOSIS — I10 Essential (primary) hypertension: Secondary | ICD-10-CM | POA: Diagnosis not present

## 2024-01-24 DIAGNOSIS — I10 Essential (primary) hypertension: Secondary | ICD-10-CM | POA: Diagnosis not present

## 2024-01-25 DIAGNOSIS — I10 Essential (primary) hypertension: Secondary | ICD-10-CM | POA: Diagnosis not present

## 2024-01-26 DIAGNOSIS — I10 Essential (primary) hypertension: Secondary | ICD-10-CM | POA: Diagnosis not present

## 2024-01-28 ENCOUNTER — Ambulatory Visit: Admitting: Podiatry

## 2024-01-28 DIAGNOSIS — I10 Essential (primary) hypertension: Secondary | ICD-10-CM | POA: Diagnosis not present

## 2024-01-29 ENCOUNTER — Telehealth: Payer: Self-pay

## 2024-01-29 DIAGNOSIS — I10 Essential (primary) hypertension: Secondary | ICD-10-CM | POA: Diagnosis not present

## 2024-01-30 ENCOUNTER — Encounter: Payer: Self-pay | Admitting: Pharmacist

## 2024-01-30 ENCOUNTER — Ambulatory Visit: Admitting: Pharmacist

## 2024-01-30 VITALS — BP 108/81 | HR 102 | Wt 238.0 lb

## 2024-01-30 DIAGNOSIS — F819 Developmental disorder of scholastic skills, unspecified: Secondary | ICD-10-CM

## 2024-01-30 DIAGNOSIS — I10 Essential (primary) hypertension: Secondary | ICD-10-CM | POA: Diagnosis not present

## 2024-01-30 NOTE — Patient Instructions (Addendum)
 Great to see you again - GREAT to hear the new pill boxes are working well for you.    Medication Changes: Famotidine   increased to 40mg  twice daily   Urosodiol at NOON  Cyclosporin at 4:00 PM    Refills of medications need for next fill include: Cyclosporine  Divalproex  Montelukast  Prazosin  Risperidone 

## 2024-01-30 NOTE — Assessment & Plan Note (Signed)
 Polypharmacy - Medication Reconciliation - Medication list reviewed and updated for patient with intellectual disability AND history of liver transplant.   Understanding of regimen: good  Understanding of indications: good  Potential of compliance: good with use of pill box - taking 4 different times per day.  Three full weeks of medications were filled into boxes.  Patient has known adherence challenges including lack of knowledge, vision loss.   Patient will return in ~3 weeks on Monday 9/22 for planned refill of up to 3 weeks supply.

## 2024-01-30 NOTE — Progress Notes (Signed)
    S:     Chief Complaint  Patient presents with   Medication Management    Medication Management - Med Box   48 y.o. who presents for medication management due to concern of polypharmacy and need for medication adherence monitoring and assistance.  Due to transplant medication - not appropriate for automated pill pack.     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 Madelon on 11/25/2023.    Patient arrives in good spirits and presents with assistance of rolling walker.  She arrived today with medication supply adequate for complete fill of 3 week supply.    Note:  Iron  (ferrous sulfate ) has been discontinued but supply remains in patient possession if needed in future.    Insurance Coverage: Medicaid  Do you know what each of your medicines is for? Yes Do you feel that your medications are working for you? yes Have you been experiencing any side effects to the medications prescribed? no  Home glucose monitoring reports fasting values 80's to 130's  O:  Review of Systems  Respiratory:  Negative for sputum production and shortness of breath.   All other systems reviewed and are negative.   Physical Exam Vitals reviewed.  Constitutional:      Appearance: Normal appearance.  Pulmonary:     Effort: Pulmonary effort is normal.  Neurological:     Mental Status: She is alert.  Psychiatric:        Mood and Affect: Mood normal.        Behavior: Behavior normal.        Thought Content: Thought content normal.      Vitals:   01/30/24 1345  BP: 108/81  Pulse: (!) 102  SpO2: 100%     A/P: Polypharmacy - Medication Reconciliation - Medication list reviewed and updated for patient with intellectual disability AND history of liver transplant.   Understanding of regimen: good  Understanding of indications: good  Potential of compliance: good with use of pill box - taking 4  different times per day.  Three full weeks of medications were filled into boxes.  Patient has known adherence challenges including lack of knowledge, vision loss.   Patient will return in ~3 weeks on Monday 9/22 for planned refill of up to 3 weeks supply.   Refills of medications need for next fill include: Cyclosporine  Divalproex  Montelukast  Prazosin  Risperidone      Written patient instructions and updated medication list provided. Total time in face to face counseling 31 minutes.     Follow up pharmacist visit 3 weeks 02/17/2024 PCP Clinic Visit TBD Patient seen with Fonda Blase, PharmD Candidate - PY3 student and Estefana Blase, PharmD Candidate - PY4 student.

## 2024-01-31 DIAGNOSIS — I10 Essential (primary) hypertension: Secondary | ICD-10-CM | POA: Diagnosis not present

## 2024-01-31 NOTE — Progress Notes (Signed)
 Reviewed and agree with Dr Rennis plan.

## 2024-02-01 DIAGNOSIS — I10 Essential (primary) hypertension: Secondary | ICD-10-CM | POA: Diagnosis not present

## 2024-02-02 DIAGNOSIS — I10 Essential (primary) hypertension: Secondary | ICD-10-CM | POA: Diagnosis not present

## 2024-02-03 DIAGNOSIS — I10 Essential (primary) hypertension: Secondary | ICD-10-CM | POA: Diagnosis not present

## 2024-02-04 DIAGNOSIS — I10 Essential (primary) hypertension: Secondary | ICD-10-CM | POA: Diagnosis not present

## 2024-02-05 ENCOUNTER — Telehealth: Payer: Self-pay

## 2024-02-05 DIAGNOSIS — I10 Essential (primary) hypertension: Secondary | ICD-10-CM | POA: Diagnosis not present

## 2024-02-05 NOTE — Progress Notes (Signed)
 Complex Care Management Care Guide Note  02/05/2024 Name: [name] MRN: [id number] DOB: [birth year]  [name] is a 48 y.o. year old female who is a primary care patient of Bhagat, Virali, DO and is actively engaged with the care management team. I reached out to [name] by phone today to assist with re-scheduling  with the RN Case Manager.  Follow up plan: Unsuccessful telephone outreach attempt made. A HIPAA compliant phone message was left for the patient providing contact information and requesting a return call.  Jeoffrey Buffalo , RMA     [facility]  [facility], [facility] Guide  Direct Dial: [phone]  Website: [email or web address]

## 2024-02-06 DIAGNOSIS — I10 Essential (primary) hypertension: Secondary | ICD-10-CM | POA: Diagnosis not present

## 2024-02-08 DIAGNOSIS — I10 Essential (primary) hypertension: Secondary | ICD-10-CM | POA: Diagnosis not present

## 2024-02-09 DIAGNOSIS — I10 Essential (primary) hypertension: Secondary | ICD-10-CM | POA: Diagnosis not present

## 2024-02-10 ENCOUNTER — Telehealth: Payer: Self-pay | Admitting: Pharmacist

## 2024-02-10 DIAGNOSIS — I10 Essential (primary) hypertension: Secondary | ICD-10-CM | POA: Diagnosis not present

## 2024-02-10 NOTE — Telephone Encounter (Signed)
 Attempted to contact patient for follow-up of need to reschedule appointment - scheduled for 9/22  Need to schedule for Friday 9/15 in AM if possible OR on Tuesday 9/23 as alternative option.   Left HIPAA compliant voice mail requesting call back to direct phone: [phone]  Total time with patient call and documentation of interaction: 4 minutes.

## 2024-02-11 ENCOUNTER — Other Ambulatory Visit: Payer: Self-pay | Admitting: Family Medicine

## 2024-02-11 DIAGNOSIS — I10 Essential (primary) hypertension: Secondary | ICD-10-CM | POA: Diagnosis not present

## 2024-02-11 DIAGNOSIS — Z1231 Encounter for screening mammogram for malignant neoplasm of breast: Secondary | ICD-10-CM

## 2024-02-11 NOTE — Progress Notes (Signed)
 Complex Care Management Care Guide Note  02/11/2024 Name: [name] MRN: [id number] DOB: [birth year]  [name] is a 48 y.o. year old female who is a primary care patient of Bhagat, Virali, DO and is actively engaged with the care management team. I reached out to [name] by phone today to assist with re-scheduling  with the RN Case Manager.  Follow up plan: Unsuccessful telephone outreach attempt made. A HIPAA compliant phone message was left for the patient providing contact information and requesting a return call.  Jeoffrey Buffalo , RMA     [facility]  [facility], [facility] Guide  Direct Dial: [phone]  Website: [email or web address]

## 2024-02-12 ENCOUNTER — Ambulatory Visit: Admitting: Podiatry

## 2024-02-12 DIAGNOSIS — I10 Essential (primary) hypertension: Secondary | ICD-10-CM | POA: Diagnosis not present

## 2024-02-13 DIAGNOSIS — I10 Essential (primary) hypertension: Secondary | ICD-10-CM | POA: Diagnosis not present

## 2024-02-14 DIAGNOSIS — I10 Essential (primary) hypertension: Secondary | ICD-10-CM | POA: Diagnosis not present

## 2024-02-15 DIAGNOSIS — I10 Essential (primary) hypertension: Secondary | ICD-10-CM | POA: Diagnosis not present

## 2024-02-16 DIAGNOSIS — I10 Essential (primary) hypertension: Secondary | ICD-10-CM | POA: Diagnosis not present

## 2024-02-17 ENCOUNTER — Ambulatory Visit: Admitting: Pharmacist

## 2024-02-17 DIAGNOSIS — I10 Essential (primary) hypertension: Secondary | ICD-10-CM | POA: Diagnosis not present

## 2024-02-18 ENCOUNTER — Ambulatory Visit: Admitting: Pharmacist

## 2024-02-18 ENCOUNTER — Ambulatory Visit (INDEPENDENT_AMBULATORY_CARE_PROVIDER_SITE_OTHER): Admitting: Audiology

## 2024-02-18 ENCOUNTER — Encounter (INDEPENDENT_AMBULATORY_CARE_PROVIDER_SITE_OTHER): Payer: Self-pay | Admitting: Otolaryngology

## 2024-02-18 ENCOUNTER — Ambulatory Visit (INDEPENDENT_AMBULATORY_CARE_PROVIDER_SITE_OTHER): Admitting: Otolaryngology

## 2024-02-18 ENCOUNTER — Encounter: Payer: Self-pay | Admitting: Pharmacist

## 2024-02-18 VITALS — BP 117/76 | HR 94 | Temp 97.1°F | Ht 64.0 in | Wt 237.0 lb

## 2024-02-18 VITALS — BP 120/79 | HR 89

## 2024-02-18 DIAGNOSIS — H903 Sensorineural hearing loss, bilateral: Secondary | ICD-10-CM

## 2024-02-18 DIAGNOSIS — I10 Essential (primary) hypertension: Secondary | ICD-10-CM | POA: Diagnosis not present

## 2024-02-18 DIAGNOSIS — F79 Unspecified intellectual disabilities: Secondary | ICD-10-CM

## 2024-02-18 DIAGNOSIS — H6123 Impacted cerumen, bilateral: Secondary | ICD-10-CM | POA: Diagnosis not present

## 2024-02-18 DIAGNOSIS — F819 Developmental disorder of scholastic skills, unspecified: Secondary | ICD-10-CM

## 2024-02-18 NOTE — Assessment & Plan Note (Signed)
 Polypharmacy - Medication Reconciliation - Medication list reviewed and updated for patient with intellectual disability AND history of liver transplant.   Patient notes use of 3 nebulizer solutions - med list contains only 2: Pulmicort  (budesonide ) Perforomist  (formoterol ) -- cannot remember name of 3rd nebulizer (thinks it starts with L) (possibly albuterol ) Understanding of regimen: good  Understanding of indications: good  Potential of compliance: good with use of pill box - taking 4 different times per day.  Three full weeks of medications were filled into boxes.  Patient has known adherence challenges including lack of knowledge, vision loss.   Patient will return in ~3 weeks on Thursday 10/09 for planned refill of up to 3 weeks supply.   Refills of medications need for next fill include: Benztropine  Cyclosporine  Doxepin  Ursodiol  Multivitamin

## 2024-02-18 NOTE — Progress Notes (Signed)
    S:    Chief Complaint  Patient presents with   Medication Management    Pill Box    48 y.o. who presents for medication management due to concern of polypharmacy and need for medication adherence monitoring and assistance.  Due to transplant medication - not appropriate for automated pill pack.     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 Madelon on 11/25/2023.    Patient arrives in good spirits and presents with assistance of walker.  She arrived today with medication supply adequate for complete fill of 3 week supply.   Insurance Coverage: Medicaid  Do you know what each of your medicines is for? Yes Do you feel that your medications are working for you? yes Have you been experiencing any side effects to the medications prescribed? no  O:  Review of Systems  All other systems reviewed and are negative.   Physical Exam Vitals reviewed.  Constitutional:      Appearance: Normal appearance.  Pulmonary:     Effort: Pulmonary effort is normal.  Neurological:     Mental Status: She is alert.  Psychiatric:        Mood and Affect: Mood normal.        Behavior: Behavior normal.        Thought Content: Thought content normal.        Judgment: Judgment normal.    Vitals:   02/18/24 1042  BP: 120/79  Pulse: 89  SpO2: 100%    A/P: Polypharmacy - Medication Reconciliation - Medication list reviewed and updated for patient with intellectual disability AND history of liver transplant.   Patient notes use of 3 nebulizer solutions - med list contains only 2: Pulmicort  (budesonide ) Perforomist  (formoterol ) -- cannot remember name of 3rd nebulizer (thinks it starts with L) (possibly albuterol ) Understanding of regimen: good  Understanding of indications: good  Potential of compliance: good with use of pill box - taking 4 different times per day.  Three full weeks of medications  were filled into boxes.  Patient has known adherence challenges including lack of knowledge, vision loss.   Patient will return in ~3 weeks on Thursday 10/09 for planned refill of up to 3 weeks supply.   Refills of medications need for next fill include: Benztropine  Cyclosporine  Doxepin  Ursodiol  Multivitamin  Written patient instructions and updated medication list provided. Total time in face to face counseling 29 minutes.    Follow up pharmacist visit 03/05/24 PCP Clinic Visit 02/28/2024 Patient seen with Fonda Blase, PharmD Candidate - PY3 student and Estefana Blase, PharmD Candidate - PY4 student, Dr. Manon, DO

## 2024-02-18 NOTE — Assessment & Plan Note (Signed)
 Polypharmacy - Medication Reconciliation - Medication list reviewed and updated for patient with intellectual disability AND history of liver transplant.   Patient notes use of 3 nebulizer solutions - med list contains only 2: Pulmicort  (budesonide ) Perforomist  (formoterol ) -- cannot remember name of 3rd nebulizer (thinks it starts with L) Understanding of regimen: good  Understanding of indications: good  Potential of compliance: good with use of pill box - taking 4 different times per day.  Three full weeks of medications were filled into boxes.  Patient has known adherence challenges including lack of knowledge, vision loss.   Patient will return in ~3 weeks on Thursday 10/09 for planned refill of up to 3 weeks supply.   Refills of medications need for next fill include: Benztropine  Cyclosporine  Doxepin  Ursodiol  Multivitamin

## 2024-02-18 NOTE — Progress Notes (Signed)
  [address], [address] [place], [place] [address] [phone]  Audiological Evaluation    Name: [name]     DOB:   06-13-1975      MRN:   [id number]                                                                                     Service Date: 02/18/2024     Accompanied by: unaccompanied   Patient comes today after Dr. Karis, ENT sent a referral for a hearing evaluation due to concerns with hearing loss.   Symptoms Yes Details  Hearing loss  [x]  Left ear hearing loss after she had pneumonia in March 2025 - today symptoms seems to have resolved. During that time her left side of her neck felt numb.  Tinnitus  []    Ear pain/ infections/pressure  []    Balance problems  []    Noise exposure history  []    Previous ear surgeries  []    Family history of hearing loss  []    Amplification  []    Other  []      Otoscopy: Right ear: Non-occluding cerumen, able to visualize some tympanic membrane landmarks. Left ear:  Non-occluding cerumen, able to visualize some tympanic membrane landmarks.  Tympanometry: Right ear: Type A- Normal external ear canal volume with normal middle ear pressure and tympanic membrane compliance. Left ear: Type A- Normal external ear canal volume with normal middle ear pressure and tympanic membrane compliance.  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  Speech Audiometry: Right ear- Speech Reception Threshold (SRT) was obtained at 15 dBHL. Left ear-Speech Reception Threshold (SRT) was obtained at 15 dBHL.   Word Recognition Score Tested using NU-6 (recorded) Right ear: 100% was obtained at a presentation level of 70 dBHL with contralateral masking which is deemed as  excellent. Left ear: 100% was obtained at a  presentation level of 70 dBHL with contralateral masking which is deemed as  excellent.   The hearing test results were completed under headphones and results are deemed to be of good reliability. Test technique:  conventional    Impression: There is not a significant difference in pure-tone thresholds between ears. There is not a significant difference in the word recognition score in between ears.    Recommendations: Follow up with ENT as scheduled for today. Return for a hearing evaluation in 2 -3 years, before if concerns with hearing changes arise or per MD recommendation. Consider a communication needs assessment after medical clearance for hearing aids is obtained, and pending patient motivation   [name] [name], AUD

## 2024-02-18 NOTE — Patient Instructions (Signed)
 Great to see you again - GREAT to hear the new pill boxes are working well for you.    Medication Changes: None    Refills of medications need for next fill include: Benztropine  Cyclosporine  Doxepin  Ursodiol   Multivitamin

## 2024-02-19 DIAGNOSIS — H903 Sensorineural hearing loss, bilateral: Secondary | ICD-10-CM | POA: Insufficient documentation

## 2024-02-19 DIAGNOSIS — I10 Essential (primary) hypertension: Secondary | ICD-10-CM | POA: Diagnosis not present

## 2024-02-19 DIAGNOSIS — H6123 Impacted cerumen, bilateral: Secondary | ICD-10-CM | POA: Insufficient documentation

## 2024-02-19 NOTE — Progress Notes (Signed)
 CC: Bilateral hearing loss, bilateral ear infection  HPI:  [name] is a 48 y.o. female who presents today complaining of bilateral hearing loss.  She first noted the hearing difficulty in March of this year, when she was diagnosed with pneumonia and bilateral ear infection.  She was treated with oral antibiotics.  Currently she denies any otalgia, otorrhea, or vertigo.  She previously underwent bilateral myringotomy and tube placement as a child.  Past Medical History:  Diagnosis Date   Allergic reaction to vaccine 09/11/2021   Aortic atherosclerosis    Aortic insufficiency    mild by echo 12/2023   Asthma, well controlled, moderate persistent    followed by dr maurilio (allergy/ asthma center) and pcp   CAD (coronary artery disease), native coronary artery    Coronary CTA showed calcium  score 0.  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 outlaw   History of Bell's palsy 11/2018   left side   History of gastric ulcer    IDA (iron  deficiency anemia)    Immunosuppression due to drug therapy    Inflammatory dermatosis    hands   Intellectual disability    developmental delay   Liver transplant recipient [facility]) 06/2013   followed by [name]--- Dr Landry Quale;   for ESLD secondary PBC   Morbid obesity (HCC) 07/25/2006   Qualifier: Diagnosis of   By: LANEY MD, JOSEPH         Multifocal pneumonia 07/26/2023   OA (osteoarthritis)    OSA (obstructive sleep apnea)    study many years ago,  no cpap  (study available)   Perennial allergic rhinitis    PMB (postmenopausal bleeding)    PONV (postoperative nausea and vomiting)    Primary biliary  cirrhosis (HCC) 2012   s/p  liver transplant in 2015   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 burnette   ESOPHAGOGASTRODUODENOSCOPY (EGD) WITH PROPOFOL  N/A 10/01/2012   Procedure: ESOPHAGOGASTRODUODENOSCOPY (EGD) WITH PROPOFOL ;  Surgeon: Elsie burnette, MD;  Location: [facility];  Service: Endoscopy;  Laterality: N/A;   IR US  LIVER BIOPSY  04/24/2011   @ [facility]   LAPAROSCOPIC CHOLECYSTECTOMY  12/29/2010   @ [facility] by dr d. vernetta;   AND LAPAROSCOPIC APPENDECTOMY   LIVER TRANSPLANT  07/11/2012   @ [name]   ;   Orthotopic   STRABISMUS SURGERY     CHILD   TYMPANOSTOMY TUBE PLACEMENT Bilateral    CHILD    Family History  Problem Relation Age of Onset   Diabetes Mother    Heart disease Mother    COPD Mother    Breast cancer Cousin    Allergic rhinitis Neg  Hx    Angioedema Neg Hx    Asthma Neg Hx    Atopy Neg Hx    Eczema Neg Hx    Immunodeficiency Neg Hx    Urticaria Neg Hx     Social History:  reports that she has never smoked. She has never been exposed to tobacco smoke. She has never used smokeless tobacco. She reports that she does not drink alcohol  and does not use drugs.  Allergies:  Allergies  Allergen Reactions   Tramadol Hcl Itching and Nausea And Vomiting    Causes SEIZURES   Aspirin Nausea And Vomiting    Due to Stomach ulcer   Ibuprofen Other (See Comments)    Due to stomach ulcer   Penicillins Itching, Swelling and Rash    Breakout    Pseudoephedrine Itching and Swelling    Tongue swelling   Sulfa Antibiotics Nausea And Vomiting   Acetaminophen  Other (See Comments)    elevated LFT's   Guaifenesin   Nausea And Vomiting   Latex Itching and Rash   Rosemary Oil Swelling and Rash    Lips swell    Prior to Admission medications   Medication Sig Start Date End Date Taking? Authorizing Provider  acetaminophen  (TYLENOL ) 325 MG tablet Take 325-650 mg by mouth every 4 (four) hours as needed for moderate pain (pain score 4-6).   Yes [provider]  albuterol  (PROVENTIL ) (2.5 MG/3ML) 0.083% nebulizer solution Take 3 mLs (2.5 mg total) by nebulization every 4 (four) hours as needed for wheezing or shortness of breath. 1 vial via nebulizer every 4-6 hours as needed. 12/03/23  Yes Kozlow, Camellia PARAS, MD  ammonium lactate  (AMLACTIN) 12 % cream Apply 1 Application topically as needed for dry skin.   Yes [provider]  azelastine  (ASTELIN ) 0.1 % nasal spray Place 2 sprays into both nostrils 2 (two) times daily. 12/03/23  Yes Kozlow, Camellia PARAS, MD  benztropine  (COGENTIN ) 1 MG tablet Take 1 tablet (1 mg total) by mouth 2 (two) times daily. 01/31/23  Yes McDiarmid, Krystal BIRCH, MD  budesonide  (PULMICORT ) 0.5 MG/2ML nebulizer solution USE 2 ML(0.5 MG) VIA NEBULIZER EVERY 4 HOURS AS NEEDED 01/08/24  Yes Kozlow, Eric J, MD  carboxymethylcellulose (REFRESH PLUS) 0.5 % SOLN Place 1 drop into both eyes 4 (four) times daily as needed (for dry eyes).   Yes [provider]  cetirizine  (ZYRTEC ) 10 MG tablet Take 1 tablet (10 mg total) by mouth daily as needed for allergies (Can use an extra dose during flares). 12/03/23  Yes Kozlow, Camellia PARAS, MD  cycloSPORINE  modified 100 MG CAPS Take 100 mg by mouth 2 (two) times daily.   Yes [provider]  divalproex  (DEPAKOTE  ER) 500 MG 24 hr tablet Take 2 tablets (1,000 mg total) by mouth at bedtime. 08/29/23  Yes Onita Duos, MD  doxepin  (SINEQUAN ) 25 MG capsule Take 1 capsule (25 mg total) by mouth at bedtime. 08/08/23  Yes McDiarmid, Krystal BIRCH, MD  famotidine  (PEPCID ) 40 MG tablet Take 1 tablet (40 mg total) by mouth in the morning and at bedtime. 12/03/23  Yes Kozlow, Camellia PARAS, MD  fluticasone  (FLONASE ) 50 MCG/ACT nasal spray Place 1 spray into both nostrils in the morning and at bedtime. 12/03/23  Yes Kozlow, Camellia PARAS, MD  fluticasone  (FLOVENT  HFA) 110 MCG/ACT inhaler Inhale 2 puffs into the lungs 2 (two) times daily. Uses - as needed for asthma flares   Yes [provider]  formoterol  (PERFOROMIST ) 20 MCG/2ML nebulizer solution Take 2  mLs (20 mcg total) by nebulization 2 (two) times daily. 12/03/23  Yes Kozlow, Camellia PARAS, MD  glucose blood (ACCU-CHEK GUIDE TEST) test strip Use as instructed 08/27/23  Yes McDiarmid, Krystal BIRCH, MD  levothyroxine  (SYNTHROID ) 75 MCG tablet TAKE 1 TABLET(75 MCG) BY MOUTH EVERY MORNING 30 MINUTES BEFORE FOOD 10/29/23  Yes Joshua Domino, DO  metoprolol  succinate (TOPROL  XL) 25 MG 24 hr tablet Take 1 tablet (25 mg total) by mouth daily. 11/20/23  Yes [name], Wilbert SAUNDERS, MD  mometasone  (ELOCON ) 0.1 % cream Apply topically 2 (two) times daily as needed. Can use on skin one to two times daily if needed. 12/03/23  Yes Kozlow, Camellia PARAS, MD  montelukast  (SINGULAIR ) 10 MG tablet Take 1 tablet (10 mg total) by mouth at bedtime. 12/03/23  Yes Kozlow, Eric J, MD  Multiple Vitamin (MULTIVITAMIN) tablet Take 2 tablets by mouth daily. 9 am. 10/12/13  Yes Jacquetta Sharlot GRADE, NP  Nebulizers MISC 1 kit by Does not apply route as directed. 08/14/22  Yes Kozlow, Camellia PARAS, MD  omeprazole  (PRILOSEC) 40 MG capsule Take 1 capsule (40 mg total) by mouth in the morning and at bedtime. 12/03/23  Yes Kozlow, Eric J, MD  polyethylene glycol (MIRALAX  / GLYCOLAX ) packet Take 17 g by mouth 2 (two) times daily. In water or juice   Yes [provider]  prazosin  (MINIPRESS ) 2 MG capsule Take 1 capsule (2 mg total) by mouth at bedtime. 01/31/23  Yes McDiarmid, Krystal BIRCH, MD  RA VITAMIN D -3 50 MCG (2000 UT) CAPS Take 1 capsule (2,000 Units total) by mouth daily. 02/19/23  Yes McDiarmid, Krystal BIRCH, MD  Respiratory Therapy Supplies (NEBULIZER/TUBING/MOUTHPIECE) KIT 2 each by Does not apply route as  directed. 12/03/23  Yes Kozlow, Camellia PARAS, MD  risperiDONE  (RISPERDAL  M-TABS) 3 MG disintegrating tablet Take 1.5-3 mg by mouth See admin instructions. Take 1.5 mg in the morning and 3 mg at night 08/08/23  Yes [provider]  rosuvastatin  (CRESTOR ) 10 MG tablet Take 1 tablet (10 mg total) by mouth daily. 12/23/23 03/22/24 Yes Shlomo Wilbert SAUNDERS, MD  Spacer/Aero-Hold Chamber Mask MISC Use as directed with inhaler. 08/14/22  Yes Kozlow, Camellia PARAS, MD  Tiotropium Bromide Monohydrate  (SPIRIVA  RESPIMAT) 1.25 MCG/ACT AERS Inhale 2 puffs into the lungs in the morning. 12/03/23  Yes Kozlow, Camellia PARAS, MD  ursodiol  (ACTIGALL ) 500 MG tablet Take 500 mg by mouth 3 (three) times daily.   Yes [provider]  VENTOLIN  HFA 108 (90 Base) MCG/ACT inhaler Inhale 2 puffs into the lungs every 4 (four) hours as needed for wheezing or shortness of breath. 12/03/23  Yes Kozlow, Eric J, MD  Wheat Dextrin (BENEFIBER DRINK MIX PO) Take 1 Scoop by mouth 3 (three) times a week.   Yes [provider]    Blood pressure 117/76, pulse 94, temperature (!) 97.1 F (36.2 C), temperature source Oral, height 5' 4 (1.626 m), weight 237 lb (107.5 kg), last menstrual period 12/02/2022, SpO2 98%. Exam: General: Communicates without difficulty, well nourished, no acute distress. Head: Normocephalic, no evidence injury, no tenderness, facial buttresses intact without stepoff. Face/sinus: No tenderness to palpation and percussion. Facial movement is normal and symmetric. Eyes: PERRL, EOMI. No scleral icterus, conjunctivae clear. Neuro: CN II exam reveals vision grossly intact.  No nystagmus at any point of gaze. Ears: Auricles well formed without lesions.  Bilateral cerumen impaction.  Nose: External evaluation reveals normal support and skin without lesions.  Dorsum is intact.  Anterior rhinoscopy reveals congested mucosa  over anterior aspect of inferior turbinates and intact septum.  No purulence noted. Oral:  Oral cavity and  oropharynx are intact, symmetric, without erythema or edema.  Mucosa is moist without lesions. Neck: Full range of motion without pain.  There is no significant lymphadenopathy.  No masses palpable.  Thyroid  bed within normal limits to palpation.  Parotid glands and submandibular glands equal bilaterally without mass.  Trachea is midline. Neuro:  CN 2-12 grossly intact.   Procedure: Bilateral cerumen disimpaction Anesthesia: None Description: Under the operating microscope, the cerumen is carefully removed with a combination of cerumen currette, alligator forceps, and suction catheters.  After the cerumen is removed, the TMs are noted to be normal.  No mass, erythema, or lesions. The patient tolerated the procedure well.    Her hearing test shows bilateral symmetric high-frequency sensorineural hearing loss, likely secondary to presbycusis.  Assessment: 1.  Bilateral symmetric high-frequency sensorineural hearing loss.  No asymmetry or conductive hearing loss is noted today. 2.  Bilateral cerumen impaction.  After the disimpaction procedure, both tympanic membranes and middle ear spaces are noted to be normal.  No middle ear effusion or infection is noted.  Plan: 1.  Otomicroscopy with bilateral cerumen disimpaction. 2.  The physical exam findings and the hearing test results are reviewed with the patient. 3.  The patient is reassured that no otologic abnormality is noted. 4.  The patient is a candidate for hearing amplification.  Hearing aid options are discussed. 5.  The patient will return for reevaluation in 6 months.  [name] 02/19/2024, 8:21 AM

## 2024-02-20 DIAGNOSIS — I10 Essential (primary) hypertension: Secondary | ICD-10-CM | POA: Diagnosis not present

## 2024-02-20 NOTE — Progress Notes (Signed)
 Reviewed and agree with Dr Rennis plan.

## 2024-02-21 DIAGNOSIS — I10 Essential (primary) hypertension: Secondary | ICD-10-CM | POA: Diagnosis not present

## 2024-02-22 DIAGNOSIS — I10 Essential (primary) hypertension: Secondary | ICD-10-CM | POA: Diagnosis not present

## 2024-02-26 ENCOUNTER — Ambulatory Visit: Admitting: Podiatry

## 2024-02-26 ENCOUNTER — Encounter: Payer: Self-pay | Admitting: Podiatry

## 2024-02-26 DIAGNOSIS — D696 Thrombocytopenia, unspecified: Secondary | ICD-10-CM | POA: Diagnosis not present

## 2024-02-26 DIAGNOSIS — M79674 Pain in right toe(s): Secondary | ICD-10-CM

## 2024-02-26 DIAGNOSIS — M79675 Pain in left toe(s): Secondary | ICD-10-CM | POA: Diagnosis not present

## 2024-02-26 DIAGNOSIS — B351 Tinea unguium: Secondary | ICD-10-CM

## 2024-02-26 NOTE — Progress Notes (Signed)
This patient returns to the office for evaluation and treatment of long thick painful nails .  This patient is unable to trim her own nails since the patient cannot reach her feet.  Patient says the nails are painful walking and wearing her shoes.  She returns for preventive foot care services.  General Appearance  Alert, conversant and in no acute stress.  Vascular  Dorsalis pedis and posterior tibial  pulses are palpable  bilaterally.  Capillary return is within normal limits  bilaterally. Temperature is within normal limits  bilaterally.  Neurologic  Senn-Weinstein monofilament wire test within normal limits  bilaterally. Muscle power within normal limits bilaterally.  Nails Thick disfigured discolored nails with subungual debris  from hallux to fifth toes bilaterally. No evidence of bacterial infection or drainage bilaterally.  Orthopedic  No limitations of motion  feet .  No crepitus or effusions noted.  No bony pathology or digital deformities noted.  Skin  normotropic skin with no porokeratosis noted bilaterally.  No signs of infections or ulcers noted.     Onychomycosis  Pain in toes right foot  Pain in toes left foot  Debridement  of nails  1-5  B/L with a nail nipper.  Nails were then filed using a dremel tool with no incidents.    RTC  3 months   Nitza Schmid DPM   

## 2024-02-28 ENCOUNTER — Ambulatory Visit

## 2024-03-05 ENCOUNTER — Ambulatory Visit: Admitting: Pharmacist

## 2024-03-05 VITALS — BP 127/84 | HR 103 | Wt 236.0 lb

## 2024-03-05 DIAGNOSIS — F79 Unspecified intellectual disabilities: Secondary | ICD-10-CM | POA: Diagnosis not present

## 2024-03-05 MED ORDER — AZELASTINE HCL 0.1 % NA SOLN: 2.0000 | Freq: Two times a day (BID) | NASAL | 1 refills | Status: AC

## 2024-03-05 NOTE — Patient Instructions (Signed)
 Great to see you again - GREAT to hear the new pill boxes are working well for you.    Medication Changes: None     Refills of medications need for next fill include: Benztropine  Cetirizine  Doxepin  Cyclosporine  Prazosin  Risperidone  Rosuvastatin  Vitamin D  Ursodiol 

## 2024-03-06 ENCOUNTER — Encounter: Payer: Self-pay | Admitting: Pharmacist

## 2024-03-06 NOTE — Progress Notes (Signed)
    S:     Chief Complaint  Patient presents with   Medication Management    Medication - Medbox Fill   48 y.o. who presents for medication management due to concern of polypharmacy and need for medication adherence monitoring and assistance.  She arrives > 60 minutes late for appointment due to travel issues.  She was seen due to need to fill pill boxes. Due to transplant medication - not appropriate for automated pill pack.     PMH is significant for liver transplant 06/2013, schizophrenia, Asthma, CAD and intellectual disability    Patient was referred and last seen by Primary Care Provider, Dr. Madelon on 11/25/2023.    Patient arrives in good spirits and presents with assistance of rolling walker.  She arrived today with medication supply adequate for complete fill of 3 week supply.    Note:  Iron  (ferrous sulfate ) has been discontinued but supply remains in patient possession if needed in future.    Insurance Coverage: Medicaid  Do you know what each of your medicines is for? Yes Do you feel that your medications are working for you? yes Have you been experiencing any side effects to the medications prescribed? no   Home glucose monitoring reports fasting values 80's to 130's     O:  Review of Systems  Respiratory:  Negative for shortness of breath and wheezing.   All other systems reviewed and are negative.   Physical Exam Vitals reviewed.  Constitutional:      Appearance: Normal appearance.  Pulmonary:     Effort: Pulmonary effort is normal.  Neurological:     Mental Status: She is alert.  Psychiatric:        Mood and Affect: Mood normal.        Behavior: Behavior normal.        Thought Content: Thought content normal.        Judgment: Judgment normal.      Vitals:   03/05/24 1226 03/06/24 2112  BP: (!) 132/96 127/84  Pulse: (!) 103   SpO2: 100%      A/P: Polypharmacy - Medication Reconciliation - Medication list reviewed and updated for patient with  intellectual disability AND history of liver transplant.   Understanding of regimen: fair-good  Understanding of indications: good  Potential of compliance: good with use of pill box - taking 4 different times per day.  Three full weeks of medications were filled into boxes.  Patient has known adherence challenges including lack of knowledge, vision loss.   Patient will return in ~3 weeks on Monday 10/27 for planned refill of up to 3 weeks supply.   Refills of medications need for next fill include: Benztropine  Cetirizine  Doxepin  Cyclosporine  Prazosin  Risperidone  Rosuvastatin  Vitamin D  Ursodiol    Written patient instructions and updated medication list provided. Total time in face to face counseling 48 minutes.    Follow up pharmacist visit 03/23/2024 PCP Clinic Visit TBD Patient seen with Lawson Mao, PharmD Candidate - PY3 student and Belvie Macintosh, PharmD - PY4 Candidate.

## 2024-03-06 NOTE — Assessment & Plan Note (Signed)
 Polypharmacy - Medication Reconciliation - Medication list reviewed and updated for patient with intellectual disability AND history of liver transplant.   Understanding of regimen: fair-good  Understanding of indications: good  Potential of compliance: good with use of pill box - taking 4 different times per day.  Three full weeks of medications were filled into boxes.  Patient has known adherence challenges including lack of knowledge, vision loss.   Patient will return in ~3 weeks on Monday 10/27 for planned refill of up to 3 weeks supply.

## 2024-03-09 NOTE — Progress Notes (Signed)
 Reviewed and agree with Dr Rennis plan.

## 2024-03-13 ENCOUNTER — Ambulatory Visit

## 2024-03-22 DIAGNOSIS — I10 Essential (primary) hypertension: Secondary | ICD-10-CM | POA: Diagnosis not present

## 2024-03-23 ENCOUNTER — Ambulatory Visit (INDEPENDENT_AMBULATORY_CARE_PROVIDER_SITE_OTHER)

## 2024-03-23 ENCOUNTER — Ambulatory Visit (INDEPENDENT_AMBULATORY_CARE_PROVIDER_SITE_OTHER): Admitting: Pharmacist

## 2024-03-23 ENCOUNTER — Telehealth: Payer: Self-pay

## 2024-03-23 ENCOUNTER — Encounter: Payer: Self-pay | Admitting: Pharmacist

## 2024-03-23 VITALS — BP 102/59 | HR 87 | Wt 240.0 lb

## 2024-03-23 DIAGNOSIS — F819 Developmental disorder of scholastic skills, unspecified: Secondary | ICD-10-CM | POA: Diagnosis not present

## 2024-03-23 DIAGNOSIS — Z23 Encounter for immunization: Secondary | ICD-10-CM | POA: Diagnosis not present

## 2024-03-23 DIAGNOSIS — J4550 Severe persistent asthma, uncomplicated: Secondary | ICD-10-CM

## 2024-03-23 MED ORDER — URSODIOL 500 MG PO TABS: 500.0000 mg | ORAL_TABLET | Freq: Three times a day (TID) | ORAL | 3 refills | Status: AC

## 2024-03-23 MED ORDER — FAMOTIDINE 40 MG PO TABS: 40.0000 mg | ORAL_TABLET | Freq: Two times a day (BID) | ORAL | 3 refills | Status: AC

## 2024-03-23 MED ORDER — RISPERIDONE 3 MG PO TBDP: 1.5000 mg | ORAL_TABLET | ORAL | 3 refills | Status: AC

## 2024-03-23 MED ORDER — AMMONIUM LACTATE 12 % EX CREA: 1.0000 | TOPICAL_CREAM | CUTANEOUS | 1 refills | Status: AC | PRN

## 2024-03-23 MED ORDER — PRAZOSIN HCL 2 MG PO CAPS: 2.0000 mg | ORAL_CAPSULE | Freq: Every day | ORAL | 3 refills | Status: AC

## 2024-03-23 MED ORDER — DOXEPIN HCL 25 MG PO CAPS: 25.0000 mg | ORAL_CAPSULE | Freq: Every day | ORAL | 3 refills | Status: AC

## 2024-03-23 MED ORDER — BENZTROPINE MESYLATE 1 MG PO TABS: 1.0000 mg | ORAL_TABLET | Freq: Two times a day (BID) | ORAL | 3 refills | Status: AC

## 2024-03-23 MED ORDER — CYCLOSPORINE 100 MG PO CAPS: 100.0000 mg | ORAL_CAPSULE | Freq: Two times a day (BID) | ORAL | 11 refills | Status: DC

## 2024-03-23 NOTE — Telephone Encounter (Signed)
 Patient in clinic today with Dr. Koval. Patient states that she is in need of nebulizer tubing and supplies. Received verbal order from Dr. Koval/McDiarmid to place DME order.   Order placed in nursing encounter.   Community message sent to [employer].   Will await response.   Chiquita JAYSON English, RN

## 2024-03-23 NOTE — Telephone Encounter (Signed)
 Receipt confirmed by [employer].   Chiquita JAYSON English, RN

## 2024-03-23 NOTE — Assessment & Plan Note (Signed)
 Polypharmacy - Medication Reconciliation - Medication list reviewed and updated for patient with intellectual disability AND history of liver transplant.   Understanding of regimen: fair-good  Understanding of indications: good  Potential of compliance: good with use of pill box - taking 4 different times per day.  TWO full weeks of medications were filled into boxes -  short of both cyclosporine , benztropine  (4 days need filled)  Patient has known adherence challenges including lack of knowledge, vision loss.   Patient will return in 2weeks on Monday 11/10 for planned refill of up to 3 weeks supply.

## 2024-03-23 NOTE — Progress Notes (Signed)
 Patient presents to nurse clinic for flu and covid vaccines. Vaccines administered without complication. See admin for details.

## 2024-03-23 NOTE — Progress Notes (Signed)
    S:     Chief Complaint  Patient presents with   Medication Management    Medication Pill Box Fill and Medication Review   48 y.o. who presents for medication management due to concern of polypharmacy and need for medication adherence monitoring and assistance.  She arrives early and also has plans for both flu and covid shots today in nurse clinic.  Due to transplant medication - not appropriate for automated pill pack.     PMH is significant for liver transplant 06/2013, schizophrenia, Asthma, CAD and intellectual disability   Patient was referred and last seen by Primary Care Provider, Dr. Madelon on 11/25/2023.    Patient arrives in good spirits and presents with assistance of rolling walker.  She arrived today with medication supply adequate for complete fill of 3 week supply.    Note: Iron  (ferrous sulfate ) has been discontinued but supply remains in patient possession if needed in future.    Insurance Coverage: Medicaid  Do you know what each of your medicines is for? Yes Do you feel that your medications are working for you? yes Have you been experiencing any side effects to the medications prescribed? no   O:  Review of Systems  Respiratory:  Negative for shortness of breath and wheezing.   All other systems reviewed and are negative.   Physical Exam Vitals reviewed.  Constitutional:      Appearance: Normal appearance.  Pulmonary:     Effort: Pulmonary effort is normal.  Neurological:     Mental Status: She is alert.  Psychiatric:        Mood and Affect: Mood normal.        Thought Content: Thought content normal.      Vitals:   03/23/24 0912  BP: (!) 102/59  Pulse: 87  SpO2: 100%     A/P: Polypharmacy - Medication Reconciliation - Medication list reviewed and updated for patient with intellectual disability AND history of liver transplant.   Understanding of regimen: fair-good  Understanding of indications: good  Potential of compliance: good with  use of pill box - taking 4 different times per day.  TWO full weeks of medications were filled into boxes -  short of both cyclosporine , benztropine  (4 days need filled)  Patient has known adherence challenges including lack of knowledge, vision loss.   Patient will return in 2weeks on Monday 11/10 for planned refill of up to 3 weeks supply.   Refills of medications need for next fill include: Benztropine  Doxepin  Famotidine  Cyclosporine  Prazosin  Risperidone  Ursodiol   Vitamin D  - From [employer]  Also requested refill of nebulaizer set and face mask.  Request made to PCP for approval with help of RN.   Written patient instructions and updated medication list provided. Total time in face to face counseling 47 minutes.    Follow up pharmacist visit 04/06/24 PCP Clinic Visit 04/09/24 Patient seen with Belvie Macintosh, PharmD - PY4 Candidate.

## 2024-03-23 NOTE — Patient Instructions (Signed)
 Great to see you again - GREAT to hear the new pill boxes are working well for you.    Medication Changes: None    Refills of medications need for next fill include: Benztropine  Doxepin  Famotidine  Cyclosporine  Prazosin  Risperidone  Ursodiol   Vitamin D  - From [employer]

## 2024-03-24 NOTE — Progress Notes (Signed)
 Reviewed and agree with Dr Rennis plan.

## 2024-03-25 ENCOUNTER — Other Ambulatory Visit: Payer: Self-pay | Admitting: Family Medicine

## 2024-03-25 NOTE — Progress Notes (Unsigned)
 [name] is {Pc accompanied by:5710} Sources of clinical information for visit is/are {Information source:60032}. Nursing assessment for this office visit was reviewed with the patient for accuracy and revision.   Previous Report(s) Reviewed: {Outside review:15817}     10/23/2023    1:59 PM  Depression screen PHQ 2/9  Decreased Interest 0  Down, Depressed, Hopeless 1  PHQ - 2 Score 1   Flowsheet Row Office Visit from 10/01/2023 in [facility] - A Dept Of [place]. [facility] Office Visit from 08/02/2023 in [facility] - A Dept Of [place]. [facility] Office Visit from 06/04/2023 in [facility] - [facility] Dept Of [place]. [facility]  Thoughts that you would be better off dead, or of hurting yourself in some way Not at all Not at all Not at all  PHQ-9 Total Score 0 0 1       10/23/2023    1:58 PM 09/19/2023    1:34 PM 09/16/2023   10:10 AM 08/02/2023    3:17 PM 12/24/2022    3:06 PM  Fall Risk   Falls in the past year? 1 0 0 0 0  Number falls in past yr: 0 0 0 0 0  Injury with Fall? 0 0 0 0 0  Risk for fall due to : Impaired balance/gait Impaired balance/gait No Fall Risks No Fall Risks   Risk for fall due to: Comment  patient states that she had pneumonia     Follow up Falls evaluation completed;Education provided;Falls prevention discussed Falls evaluation completed;Education provided;Falls prevention discussed Falls evaluation completed Falls evaluation completed        10/23/2023    1:59 PM 10/01/2023    9:06 AM 09/19/2023    1:36 PM  PHQ9 SCORE ONLY  PHQ-9 Total Score 1  0  0      Data saved with a previous flowsheet row definition    There are no preventive care reminders to display for this patient.  Health Maintenance Due  Topic Date Due   Hepatitis B Vaccines 19-59 Average Risk (1 of 3 - 19+ 3-dose series) Never done      History/P.E. limitations: {exam; limitations ed:60112}  There are no  preventive care reminders to display for this patient. There are no preventive care reminders to display for this patient.  Health Maintenance Due  Topic Date Due   Hepatitis B Vaccines 19-59 Average Risk (1 of 3 - 19+ 3-dose series) Never done     No chief complaint on file.    Discussed the use of AI scribe software for clinical note transcription with the patient, who gave verbal consent to proceed.  History of Present Illness      SDOH Screenings   Food Insecurity: No Food Insecurity (08/12/2023)  Housing: Unknown (10/28/2023)   Received from [facility]  Transportation Needs: No Transportation Needs (08/12/2023)  Utilities: Not At Risk (08/12/2023)  Depression (PHQ2-9): Low Risk  (10/23/2023)  Financial Resource Strain: Low Risk  (02/08/2021)  Stress: No Stress Concern Present (02/08/2021)  Tobacco Use: Low Risk  (03/23/2024)   --------------------------------------------------------------------------------------------------------------------------------------------- Visit Problem List with Assessment and Plan   Assessment and Plan Assessment & Plan      No problem-specific Assessment & Plan notes found for this encounter.

## 2024-03-26 DIAGNOSIS — I10 Essential (primary) hypertension: Secondary | ICD-10-CM | POA: Diagnosis not present

## 2024-03-28 DIAGNOSIS — I10 Essential (primary) hypertension: Secondary | ICD-10-CM | POA: Diagnosis not present

## 2024-03-29 DIAGNOSIS — I10 Essential (primary) hypertension: Secondary | ICD-10-CM | POA: Diagnosis not present

## 2024-03-31 DIAGNOSIS — I10 Essential (primary) hypertension: Secondary | ICD-10-CM | POA: Diagnosis not present

## 2024-04-02 ENCOUNTER — Ambulatory Visit
Admission: RE | Admit: 2024-04-02 | Discharge: 2024-04-02 | Disposition: A | Source: Ambulatory Visit | Attending: Family Medicine

## 2024-04-02 DIAGNOSIS — Z1231 Encounter for screening mammogram for malignant neoplasm of breast: Secondary | ICD-10-CM | POA: Diagnosis not present

## 2024-04-06 ENCOUNTER — Encounter: Payer: Self-pay | Admitting: Pharmacist

## 2024-04-06 ENCOUNTER — Ambulatory Visit (INDEPENDENT_AMBULATORY_CARE_PROVIDER_SITE_OTHER): Admitting: Pharmacist

## 2024-04-06 VITALS — BP 125/81 | HR 93 | Wt 239.0 lb

## 2024-04-06 DIAGNOSIS — F79 Unspecified intellectual disabilities: Secondary | ICD-10-CM

## 2024-04-06 NOTE — Progress Notes (Signed)
    S:    Chief Complaint  Patient presents with   Medication Management    Polypharmacy and pill box fill   48 y.o. who presents for medication management due to concern of polypharmacy and need for medication adherence monitoring and assistance Patient arrives in good spirits and presents with assistance of rolling walker.    PMH is significant for liver transplant 06/2013, schizophrenia, Asthma, CAD and intellectual disability   Patient was referred and last seen by Primary Care Provider, Dr. Madelon on 11/25/2023.   Insurance Coverage: Medicaid Healthy Blue  O:  Review of Systems  All other systems reviewed and are negative.   Physical Exam Constitutional:      Appearance: Normal appearance.  Pulmonary:     Effort: Pulmonary effort is normal.  Neurological:     Mental Status: She is alert.  Psychiatric:        Mood and Affect: Mood normal.        Behavior: Behavior normal.        Thought Content: Thought content normal.    Vitals:   04/06/24 0943  BP: 125/81  Pulse: 93  SpO2: 98%   A/P: Polypharmacy - Medication Reconciliation - Medication list reviewed and updated.   Understanding of regimen: fair-good Understanding of indications: fair. Reported taking metoprolol  at bedtime.  Potential of compliance: good with use of pill box - taking 4 different times per day.   The following issues were noted: not enough stock of risperidone  to fill. 6 days short of risperidone . Patient will come back in 2 weeks.  Refills of medications need for next fill include: Doxepin  Prazosin  Risperidone  Written patient instructions and updated medication list provided. Total time in face to face counseling 45 minutes.    Follow up pharmacist visit 04/21/24 Patient seen with Lawson Mao, PharmD Candidate - PY3 student and Recardo [employer] PharmD - PY4 Candidate.

## 2024-04-06 NOTE — Patient Instructions (Signed)
 Great to see you again - GREAT to hear the new pill boxes are working well for you.    Medication Changes: None     Refills of medications need for next fill include: Doxepin  Prazosin  Risperidone 

## 2024-04-06 NOTE — Assessment & Plan Note (Signed)
 Polypharmacy - Medication Reconciliation - Medication list reviewed and updated.   Understanding of regimen: fair-good Understanding of indications: fair. Reported taking metoprolol  at bedtime.  Potential of compliance: good with use of pill box - taking 4 different times per day.   The following issues were noted: not enough stock of risperidone  to fill. 6 days short of risperidone . Patient will come back in 2 weeks.  Refills of medications need for next fill include: Doxepin  Prazosin  Risperidone 

## 2024-04-07 DIAGNOSIS — I10 Essential (primary) hypertension: Secondary | ICD-10-CM | POA: Diagnosis not present

## 2024-04-09 ENCOUNTER — Ambulatory Visit

## 2024-04-09 VITALS — BP 135/83 | HR 94 | Ht 64.0 in | Wt 240.0 lb

## 2024-04-09 DIAGNOSIS — N950 Postmenopausal bleeding: Secondary | ICD-10-CM

## 2024-04-09 DIAGNOSIS — Z79899 Other long term (current) drug therapy: Secondary | ICD-10-CM | POA: Diagnosis not present

## 2024-04-09 DIAGNOSIS — E785 Hyperlipidemia, unspecified: Secondary | ICD-10-CM | POA: Diagnosis not present

## 2024-04-09 MED ORDER — SPACER/AERO-HOLD CHAMBER MASK MISC: 1.0000 [IU] | 0 refills | Status: AC | PRN

## 2024-04-09 MED ORDER — ROSUVASTATIN CALCIUM 10 MG PO TABS: 10.0000 mg | ORAL_TABLET | Freq: Every day | ORAL | 3 refills | Status: DC

## 2024-04-09 NOTE — Progress Notes (Signed)
    SUBJECTIVE:   CHIEF COMPLAINT / HPI:   Patient presents for f/u of post-menopausal bleeding. Reports an episode of AUB in Dec 2024 after cessation of menstrual cycle in June 2024. Originally scheduled for endometrial biopsy in office which could not be completed. Then scheduled for sedated endometrial biopsy which was rescheduled 2/2 hospital admission for asthma exacerbation and again 2/2 to chest pain requiring stress test. Recent echo with EF 50-55%. Now requesting procedure to be completed.  Also states [name] is requesting BMP to be completed today.  PERTINENT  PMH / PSH: hx liver transplant, asthma, AUB, GERD, HLD, IDD  OBJECTIVE:   BP (!) 141/91   Pulse 94   Ht 5' 4 (1.626 m)   Wt 240 lb (108.9 kg)   LMP 12/02/2022   SpO2 100%   BMI 41.20 kg/m    General: Alert, well-appearing female in NAD.  Neck: Supple, normal ROM Cardiovascular: RRR, murmur appreciated on exam. Radial/pedal pulse +2 bilaterally Pulmonary: Normal WOB. CTAB with no w/c/r present. Abdomen: Soft, non-tender, non-distended. Extremities: Warm and well-perfused, without cyanosis or edema. Neurologic: Normal gait, moves all four extremities appropriately Skin: No rashes or lesions.  ASSESSMENT/PLAN:   Assessment & Plan Postmenopausal bleeding Patient with episode of post-menopausal bleeding requiring endometrial biopsy - Referral placed to Gynecology for endometrial biopsy with sedation - CBC ordered 2/2 history of anemia Medication management Followed by Dr. Koval - BMP ordered per request from [name] - Order placed for spacer/chamber mask Hyperlipidemia, unspecified hyperlipidemia type Patient has not been taking Rosuvastatin . Reviewed recent lipid panel from 10/2023 which showed elevated lipid levels. Provided instruction on taking this medication daily. - Refill sent for Rosuvastatin  10 mg daily  Follow up has been scheduled with me for 05/07/24 at 1:45 PM  Darren Jernigan, DO [facility]

## 2024-04-09 NOTE — Patient Instructions (Addendum)
 Thank you for visiting the clinic today, it was good to see you!  Our plans for today: - We drew labs today. I will reach out to either via MyChart of phone when the results are back - Placed a referral to Gynecology for endometrial biopsy. They will call you in 1-2 weeks to make an appointment.  - Please make sure to take all of your medications, including your cholesterol medicine called rosuvastatin  which I sent to your pharmacy - Order placed for mask   Please follow-up on 05/07/24 at 1:45 PM and remember to bring all of your medicines with you to this visit.  Please arrive 15 minutes PRIOR to your next scheduled appointment time! If you do not, this affects OTHER patients' care.  For any questions, please call the office at [phone] or send me a message in MyChart.  It was a pleasure to take care of you today. Have a great day!  Willine Schwalbe, DO [facility] Resident, PGY-1  -------------------------------------------------------------------------------  Do you need your medications delivered to your home?   We'll send your prescription to the [facility] for delivery.          Address: [address] [place], [place], [place] [address]          Phone: [phone]  Please call the [facility] to speak with a pharmacist and set up your home medication delivery. If you have any questions, feel free to contact us  -- we're happy to help!  Other [facility]acies that offer affordable prices on both prescriptions and over-the-counter items, as well as convenient services like vaccinations, are  [facility], at [facility]         Address:  [address], [place], [place] [address]         Phone: [phone]  [facility], located in the [facility]        Address: [address] [place], [place] [address]        Phone: [phone]  [facility], at  [facility]       Address: [address], [place], [place] [address]       Phone: [phone]  [facility], at [facility]       Address: [address] Floor, [place], [place] [address]       Phone: [phone]

## 2024-04-10 DIAGNOSIS — I10 Essential (primary) hypertension: Secondary | ICD-10-CM | POA: Diagnosis not present

## 2024-04-10 LAB — CBC WITH DIFFERENTIAL/PLATELET
Basophils Absolute: 0 x10E3/uL (ref 0.0–0.2)
Basos: 1 %
EOS (ABSOLUTE): 0.1 x10E3/uL (ref 0.0–0.4)
Eos: 3 %
Hematocrit: 31.8 % — ABNORMAL LOW (ref 34.0–46.6)
Hemoglobin: 10.5 g/dL — ABNORMAL LOW (ref 11.1–15.9)
Immature Grans (Abs): 0 x10E3/uL (ref 0.0–0.1)
Immature Granulocytes: 0 %
Lymphocytes Absolute: 1.6 x10E3/uL (ref 0.7–3.1)
Lymphs: 40 %
MCH: 28.8 pg (ref 26.6–33.0)
MCHC: 33 g/dL (ref 31.5–35.7)
MCV: 87 fL (ref 79–97)
Monocytes Absolute: 0.6 x10E3/uL (ref 0.1–0.9)
Monocytes: 14 %
Neutrophils Absolute: 1.7 x10E3/uL (ref 1.4–7.0)
Neutrophils: 42 %
Platelets: 152 x10E3/uL (ref 150–450)
RBC: 3.64 x10E6/uL — ABNORMAL LOW (ref 3.77–5.28)
RDW: 13.3 % (ref 11.7–15.4)
WBC: 4.1 x10E3/uL (ref 3.4–10.8)

## 2024-04-10 LAB — BASIC METABOLIC PANEL WITH GFR
BUN/Creatinine Ratio: 21 (ref 9–23)
BUN: 28 mg/dL — ABNORMAL HIGH (ref 6–24)
CO2: 22 mmol/L (ref 20–29)
Calcium: 9.8 mg/dL (ref 8.7–10.2)
Chloride: 103 mmol/L (ref 96–106)
Creatinine, Ser: 1.31 mg/dL — ABNORMAL HIGH (ref 0.57–1.00)
Glucose: 124 mg/dL — ABNORMAL HIGH (ref 70–99)
Potassium: 4.9 mmol/L (ref 3.5–5.2)
Sodium: 140 mmol/L (ref 134–144)
eGFR: 50 mL/min/1.73 — ABNORMAL LOW (ref >59)

## 2024-04-11 DIAGNOSIS — E785 Hyperlipidemia, unspecified: Secondary | ICD-10-CM | POA: Insufficient documentation

## 2024-04-11 NOTE — Assessment & Plan Note (Signed)
 Patient has not been taking Rosuvastatin . Reviewed recent lipid panel from 10/2023 which showed elevated lipid levels. Provided instruction on taking this medication daily. - Refill sent for Rosuvastatin  10 mg daily

## 2024-04-11 NOTE — Assessment & Plan Note (Signed)
 Patient with episode of post-menopausal bleeding requiring endometrial biopsy - Referral placed to Gynecology for endometrial biopsy with sedation - CBC ordered 2/2 history of anemia

## 2024-04-15 ENCOUNTER — Ambulatory Visit: Payer: Self-pay

## 2024-04-15 NOTE — Telephone Encounter (Signed)
 Patient notified of results via results management.   BMP which was requested by [facility] showed low GFR and elevated Cr. This is likely 2/2 cyclosporine  which will need to be adjusted by the physicians at [facility]. Results should be available to [facility] via Care Everywhere but recommended patient to call so they are aware and can make adjustments as needed.

## 2024-04-17 DIAGNOSIS — I10 Essential (primary) hypertension: Secondary | ICD-10-CM | POA: Diagnosis not present

## 2024-04-19 DIAGNOSIS — I10 Essential (primary) hypertension: Secondary | ICD-10-CM | POA: Diagnosis not present

## 2024-04-21 ENCOUNTER — Ambulatory Visit: Admitting: Pharmacist

## 2024-04-21 ENCOUNTER — Other Ambulatory Visit

## 2024-04-21 ENCOUNTER — Encounter: Payer: Self-pay | Admitting: Pharmacist

## 2024-04-21 VITALS — BP 134/84 | HR 98 | Wt 243.6 lb

## 2024-04-21 DIAGNOSIS — G40909 Epilepsy, unspecified, not intractable, without status epilepticus: Secondary | ICD-10-CM

## 2024-04-21 DIAGNOSIS — G4733 Obstructive sleep apnea (adult) (pediatric): Secondary | ICD-10-CM

## 2024-04-21 DIAGNOSIS — F79 Unspecified intellectual disabilities: Secondary | ICD-10-CM | POA: Diagnosis not present

## 2024-04-21 DIAGNOSIS — E038 Other specified hypothyroidism: Secondary | ICD-10-CM

## 2024-04-21 MED ORDER — METOPROLOL SUCCINATE ER 25 MG PO TB24: 25.0000 mg | ORAL_TABLET | Freq: Every day | ORAL | 3 refills | Status: AC

## 2024-04-21 MED ORDER — CETIRIZINE HCL 10 MG PO TABS: 10.0000 mg | ORAL_TABLET | Freq: Every day | ORAL | 1 refills | Status: AC | PRN

## 2024-04-21 MED ORDER — LEVOTHYROXINE SODIUM 75 MCG PO TABS: 75.0000 ug | ORAL_TABLET | Freq: Every day | ORAL | 1 refills | Status: AC

## 2024-04-21 MED ORDER — MONTELUKAST SODIUM 10 MG PO TABS: 10.0000 mg | ORAL_TABLET | Freq: Every evening | ORAL | 1 refills | Status: AC

## 2024-04-21 MED ORDER — DIVALPROEX SODIUM ER 500 MG PO TB24: 1000.0000 mg | ORAL_TABLET | Freq: Every day | ORAL | 3 refills | Status: AC

## 2024-04-21 NOTE — Progress Notes (Signed)
 Reviewed and agree with Dr Rennis plan.

## 2024-04-21 NOTE — Progress Notes (Signed)
    S:     Chief Complaint  Patient presents with   Medication Management    Polypharmacy and pill box fill     48 y.o. female who presents for medication management due to concern of polypharmacy and need for medication adherence monitoring and assistance Patient arrives in good spirits and presents with assistance of rolling walker.     PMH is significant for liver transplant 06/2013, schizophrenia, Asthma, CAD and intellectual disability   Patient was referred and last seen by Primary Care Provider, Dr. Madelon on 11/25/2023.    Insurance Coverage: Medicaid Healthy Blue  O:  Review of Systems  All other systems reviewed and are negative.  Physical Exam Vitals reviewed.  Neurological:     Mental Status: She is alert.  Psychiatric:        Mood and Affect: Mood normal.        Behavior: Behavior normal.    Lab Results  Component Value Date   HGBA1C 5.7 (A) 11/11/2023   Vitals:   04/21/24 0921  BP: 134/84  Pulse: 98  SpO2: 100%    Lipid Panel     Component Value Date/Time   CHOL 226 (H) 11/11/2023 0921   TRIG 282 (H) 11/11/2023 0921   HDL 44 11/11/2023 0921   CHOLHDL 5.1 (H) 11/11/2023 0921   CHOLHDL 6.0 (H) 04/28/2015 0928   VLDL 47 (H) 04/28/2015 0928   LDLCALC 132 (H) 11/11/2023 0921   LDLDIRECT 64 07/10/2011 1115    Clinical Atherosclerotic Cardiovascular Disease (ASCVD): No  The 10-year ASCVD risk score (Arnett DK, et al., 2019) is: 6.3%   Values used to calculate the score:     Age: 21 years     Clincally relevant sex: Female     Is Non-Hispanic African American: Yes     Diabetic: No     Tobacco smoker: No     Systolic Blood Pressure: 134 mmHg     Is BP treated: Yes     HDL Cholesterol: 44 mg/dL     Total Cholesterol: 226 mg/dL   Patient is participating in a Managed Medicaid Plan:  Yes   A/P:  Polypharmacy - Medication Reconciliation - Medication list reviewed and updated.   Understanding of regimen: fair-good Understanding of indications:  fair.  Potential of compliance: good with use of pill box - taking 4 different times per day.    The following issues were noted: Ran out of cyclosporine . Only missing 1 capsule short for Tuesday morning. Patient returning on Monday 05/11/2024. Refills of medications need for next fill include: -Cetirizine  10 mg -Cyclosporine  100 mg  -Divalproex  500 mg  -Levothyroxine  75 mcg -Metoprolol  succinate 25 mg -Montelukast  10 mg  Written patient instructions and updated medication list provided. Total time in face to face counseling 45 minutes  Follow-up:  Pharmacist 05/11/2024 Patient seen with Lawson Mao, PharmD Candidate - PY3 student.

## 2024-04-21 NOTE — Assessment & Plan Note (Signed)
 Polypharmacy - Medication Reconciliation - Medication list reviewed and updated.   Understanding of regimen: fair-good Understanding of indications: fair.  Potential of compliance: good with use of pill box - taking 4 different times per day.    The following issues were noted: Ran out of cyclosporine . Only missing 1 capsule short for Tuesday morning. Patient returning on Monday 05/11/2024. Refills of medications need for next fill include: -Cetirizine  10 mg -Cyclosporine  100 mg  -Divalproex  500 mg  -Levothyroxine  75 mcg -Metoprolol  succinate 25 mg -Montelukast  10 mg

## 2024-04-21 NOTE — Patient Instructions (Addendum)
 Great to see you again - GREAT to hear the new pill boxes are working well for you.    Medication Changes: None   Refills of medications need for next fill include: -Cetirizine  10 mg -Cyclosporine  100 mg  -Divalproex  500 mg  -Levothyroxine  75 mcg -Metoprolol  succinate 25 mg -Montelukast  10 mg

## 2024-04-26 DIAGNOSIS — I10 Essential (primary) hypertension: Secondary | ICD-10-CM | POA: Diagnosis not present

## 2024-04-27 DIAGNOSIS — D849 Immunodeficiency, unspecified: Secondary | ICD-10-CM | POA: Diagnosis not present

## 2024-04-29 DIAGNOSIS — I10 Essential (primary) hypertension: Secondary | ICD-10-CM | POA: Diagnosis not present

## 2024-05-03 DIAGNOSIS — I10 Essential (primary) hypertension: Secondary | ICD-10-CM | POA: Diagnosis not present

## 2024-05-05 DIAGNOSIS — Z944 Liver transplant status: Secondary | ICD-10-CM | POA: Diagnosis not present

## 2024-05-05 DIAGNOSIS — Z5181 Encounter for therapeutic drug level monitoring: Secondary | ICD-10-CM | POA: Diagnosis not present

## 2024-05-05 DIAGNOSIS — Z6841 Body Mass Index (BMI) 40.0 and over, adult: Secondary | ICD-10-CM | POA: Diagnosis not present

## 2024-05-05 DIAGNOSIS — D849 Immunodeficiency, unspecified: Secondary | ICD-10-CM | POA: Diagnosis not present

## 2024-05-06 DIAGNOSIS — I10 Essential (primary) hypertension: Secondary | ICD-10-CM | POA: Diagnosis not present

## 2024-05-07 ENCOUNTER — Ambulatory Visit: Payer: Self-pay

## 2024-05-11 ENCOUNTER — Ambulatory Visit: Admitting: Pharmacist

## 2024-05-11 ENCOUNTER — Encounter: Payer: Self-pay | Admitting: Pharmacist

## 2024-05-11 VITALS — BP 133/88 | HR 82 | Wt 243.0 lb

## 2024-05-11 DIAGNOSIS — F79 Unspecified intellectual disabilities: Secondary | ICD-10-CM | POA: Diagnosis not present

## 2024-05-11 DIAGNOSIS — I10 Essential (primary) hypertension: Secondary | ICD-10-CM | POA: Diagnosis not present

## 2024-05-11 NOTE — Progress Notes (Signed)
° ° °  S:     Chief Complaint  Patient presents with   Medication Management    Medication Pill Box Fill   48 y.o. who presents for medication management due to concern of polypharmacy and need for medication adherence monitoring and assistance Patient arrives in good spirits and presents with assistance of rolling walker.     PMH is significant for liver transplant 06/2013, schizophrenia, Asthma, CAD and intellectual disability   Patient was referred and last seen by Primary Care Provider, Dr. Jerrie on 04/09/2024.    Insurance Coverage: Medicaid Healthy Blue   O:  Review of Systems  All other systems reviewed and are negative.   Physical Exam Vitals reviewed.  Constitutional:      Appearance: Normal appearance.  Pulmonary:     Effort: Pulmonary effort is normal.  Neurological:     Mental Status: She is alert.  Psychiatric:        Mood and Affect: Mood normal.        Thought Content: Thought content normal.     Vitals:   05/11/24 1008 05/11/24 1009  BP: (!) 136/90 133/88  Pulse: 82   SpO2: 99%     A/P: Polypharmacy - Medication Reconciliation in patient with intellectual disability and history of transplant - Medication list reviewed and updated.   Understanding of regimen: fair-good Understanding of indications: fair.  Potential of compliance: good with use of pill box - taking 4 different times per day.    The following issues were noted: Full with all oral medications for 3 entire weeks. Refills of medications need for next fill include: -Benztropine1mg  -Cyclosporine  100 mg -Rosuvastatin  10mg     Written patient instructions and updated medication list provided. Total time in face to face counseling 53 minutes.    Follow up pharmacist visit 06/01/2024 PCP Clinic Visit 05/19/2024

## 2024-05-11 NOTE — Patient Instructions (Addendum)
 Great to see you again - GREAT to hear the new pill boxes are working well for you.    Medication Changes: None   Refills of medications need for next fill include: -Benztropine  -Cyclosporine  - Rosuvastatin 

## 2024-05-11 NOTE — Assessment & Plan Note (Signed)
 Polypharmacy - Medication Reconciliation in patient with intellectual disability and history of transplant - Medication list reviewed and updated.   Understanding of regimen: fair-good Understanding of indications: fair.  Potential of compliance: good with use of pill box - taking 4 different times per day.    The following issues were noted: Full with all oral medications for 3 entire weeks.

## 2024-05-12 DIAGNOSIS — I10 Essential (primary) hypertension: Secondary | ICD-10-CM | POA: Diagnosis not present

## 2024-05-15 NOTE — Progress Notes (Signed)
 Reviewed and agree with Dr Rennis plan.

## 2024-05-18 DIAGNOSIS — I10 Essential (primary) hypertension: Secondary | ICD-10-CM | POA: Diagnosis not present

## 2024-05-19 ENCOUNTER — Ambulatory Visit

## 2024-05-19 VITALS — BP 129/93 | HR 88 | Ht 64.0 in | Wt 243.4 lb

## 2024-05-19 DIAGNOSIS — N950 Postmenopausal bleeding: Secondary | ICD-10-CM

## 2024-05-19 DIAGNOSIS — R053 Chronic cough: Secondary | ICD-10-CM

## 2024-05-19 NOTE — Patient Instructions (Addendum)
 Thank you for visiting the clinic today, it was good to see you!  Our plans for today: - I will call the Gynecologist to set up an appointment for you and let you know when that is. - Use things like hot tea and cough drops to help with your cough until your appointment with the lung doctor in January  Please follow-up in 2 months. Make sure to bring ALL of your medications with you to every visit.   Please arrive 15 minutes PRIOR to your next scheduled appointment time! If you do not, this affects OTHER patients' care.  For any questions, please call the office at [phone] or send me a message in MyChart.  It was a pleasure to take care of you today. Have a great day!  Aaliah Jorgenson, DO [facility] Resident, PGY-1

## 2024-05-19 NOTE — Progress Notes (Signed)
" ° ° °  SUBJECTIVE:   CHIEF COMPLAINT / HPI:   [name] is a 48 y.o.female who presents for f/u on AUB. Referral sent to OB/GYN for endometrial biopsy with sedation at last visit. Patient states she has not been contacted by them.  She also reports a cough since October. Uses albuterol  and budesonide  which helps for about 30 minutes. Occurs about 2 days/week and is worse with eating. She also notices wheezing at times and chest tightness when outside in the cold. Also reports right sided upper to mid back pain that is worse with coughing.  PERTINENT  PMH / PSH: hx liver transplant, asthma, AUB, GERD, HLD, IDD   OBJECTIVE:   BP (!) 129/93   Pulse 88   Ht 5' 4 (1.626 m)   Wt 243 lb 6.4 oz (110.4 kg)   LMP 12/02/2022   SpO2 100%   BMI 41.78 kg/m    General: Alert, well-appearing female in NAD.  Neck: Supple, normal ROM, no LAD Cardiovascular: RRR, murmur on exam.  Pulmonary: Normal WOB. CTAB with no w/c/r present.  Abdomen: Soft, non-tender, non-distended. Extremities: Warm and well-perfused, without cyanosis or edema. Neurologic: Normal gait, moves all four extremities appropriately Skin: No rashes or lesions.  ASSESSMENT/PLAN:   Assessment & Plan Postmenopausal bleeding Referral sent 11/14 to [facility]. Patient states she has not been contacted yet. I reached out to [facility] who stated they do not have availability at this time but Ms. Quinlivan has been placed on a waitlist and they will contact her they are able to schedule her.  Chronic cough Afebrile, CTAB without wheezing on exam and no recent viral sx so I do not suspect asthma exacerbation and do not think a course of steroids necessary at this time. Back pain likely from coughing. Patient states she is will be following up with pulm in January and I encouraged her to discuss the cough further then. Patient agreeable to continuing with supportive care in the meantime which we discussed  Patient to f/u with me in 2  months  Darren Jernigan, DO [facility] "

## 2024-05-22 DIAGNOSIS — I10 Essential (primary) hypertension: Secondary | ICD-10-CM | POA: Diagnosis not present

## 2024-05-22 NOTE — Assessment & Plan Note (Signed)
 Referral sent 11/14 to [facility]. Patient states she has not been contacted yet. I reached out to [facility] who stated they do not have availability at this time but Ms. Wieneke has been placed on a waitlist and they will contact her they are able to schedule her.

## 2024-05-26 ENCOUNTER — Ambulatory Visit: Admitting: Podiatry

## 2024-05-26 NOTE — Progress Notes (Unsigned)
 "    Cardiology Note    Date:  05/27/2024   ID:  [name], [name] 1975/07/21, MRN [id number]  PCP:  Jerrie Gathers, DO  Cardiologist:  Wilbert Bihari, MD   Chief Complaint  Patient presents with   Follow-up    PSVT, palpitations, CAD, hyperlipidemia    History of Present Illness:  [name] is a 48 y.o. female with a history of asthma, CKD stage IIIa, fibromyalgia, GERD, liver transplant, OSA, primary biliary cirrhosis, schizoaffective disorder and sickle cell trait.  She was recently seen by her PCP and complained of intermittent chest pain and palpitations.  She is also planned to have a D&C by her GYN in the near future and her PCP wanted her to have a cardiology workup prior to that.  She was seen in the ER on 10/01/2023 with low blood pressure and complained of chest pains and palpitations as well.  High-sensitivity troponin was normal with 3 x 2 and EKG showed normal sinus rhythm 88 bpm with minimal LVH by voltage criteria and no acute ST changes.  She is now referred for further cardiac evaluation of her chest pain and palpitations.    She underwent a coronary CTA showing no coronary Ca and <25% soft plaque in the mid to distal LAD. 2D echo showed EF 50-55% with G1DD, normal RV. Heart monitor showed 2 episodes of NSVT up to 5 beats and rare PVCs/PACs.  Palpitations associated with NSR.   She is now back for followup today. She is doing well today.  She denies any chest pain or pressure, PND, orthopnea, LE ankle edema, dizziness, palpitations or syncope. She has chronic DOE related to sedentary state.  She uses a walker for ambulation. Her SOB is stable.   Past Medical History:  Diagnosis Date   Acute cough 09/16/2023   Acute respiratory failure with hypoxia (HCC) 07/23/2023   AKI (acute kidney injury) 05/03/2022   Allergic reaction to vaccine 09/11/2021   Aortic atherosclerosis    Aortic insufficiency    mild by echo 12/2023   Asthma, well controlled, moderate  persistent    followed by dr maurilio (allergy/ asthma center) and pcp   CAD (coronary artery disease), native coronary artery    Coronary CTA showed calcium  score 0.  She does have some mild soft plaque of less than 24% in the mid to distal LAD and proximal to mid left circumflex on CTA 11/2023   Chest pain 10/01/2023   Chronic constipation    CKD (chronic kidney disease), stage III [facility])    nephrologist @ [name]--- yasmeen murray-hyman   Cough, unspecified type    Delay in development 07/12/2013   Diarrhea 07/22/2023   Fall 07/22/2023   Fibromyalgia    GERD (gastroesophageal reflux disease)    GI-- dr w. outlaw   Hematuria, CKD3 07/22/2023   History of Bell's palsy 11/2018   left side   History of gastric ulcer    Hyperkalemia 07/22/2023   Hypoalbuminemia 07/23/2023   Hypotension 10/01/2023   IDA (iron  deficiency anemia)    Immunosuppression due to drug therapy    Inflammatory dermatosis    hands   Intellectual disability    developmental delay   Left otitis media 08/28/2023   Liver transplant recipient [facility]) 06/2013   followed by [name]--- Dr Landry Quale;   for ESLD secondary PBC   Menorrhagia 12/22/2010   Moderate persistent asthma with exacerbation 07/23/2023   Morbid obesity (HCC) 07/25/2006   Qualifier: Diagnosis  of   By: LANEY MD, JOSEPH         Multifocal pneumonia 07/26/2023   OA (osteoarthritis)    Onychogryphosis 01/14/2020   OSA (obstructive sleep apnea)    study many years ago,  no cpap  (study available)   Pap smear for cervical cancer screening 11/27/2021   Pedal edema 01/14/2020   Perennial allergic rhinitis    PMB (postmenopausal bleeding)    PONV (postoperative nausea and vomiting)    Primary biliary cirrhosis (HCC) 2012   s/p  liver transplant in 2015   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   Right knee pain 05/16/2022   Schizoaffective disorder, bipolar type (HCC)    Seasonal allergic rhinitis due to pollen    Seizure disorder (HCC)    congenital  epilepsy;   neuorologist--- dr onita   Sickle cell trait    Streptococcal pneumonia 07/22/2023   Thrush 08/02/2023   Vitamin D  deficiency     Past Surgical History:  Procedure Laterality Date   ADENOIDECTOMY     CHILD   COLONOSCOPY W/ BIOPSIES  11/2019   dr w. burnette   ESOPHAGOGASTRODUODENOSCOPY (EGD) WITH PROPOFOL  N/A 10/01/2012   Procedure: ESOPHAGOGASTRODUODENOSCOPY (EGD) WITH PROPOFOL ;  Surgeon: Elsie Burnette, MD;  Location: [facility];  Service: Endoscopy;  Laterality: N/A;   IR US  LIVER BIOPSY  04/24/2011   @ [facility]   LAPAROSCOPIC CHOLECYSTECTOMY  12/29/2010   @ [facility] by dr d. vernetta;   AND LAPAROSCOPIC APPENDECTOMY   LIVER TRANSPLANT  07/11/2012   @ [name]   ;   Orthotopic   STRABISMUS SURGERY     CHILD   TYMPANOSTOMY TUBE PLACEMENT Bilateral    CHILD    Current Medications: Current Meds  Medication Sig   acetaminophen  (TYLENOL ) 325 MG tablet Take 325-650 mg by mouth every 4 (four) hours as needed for moderate pain (pain score 4-6).   albuterol  (PROVENTIL ) (2.5 MG/3ML) 0.083% nebulizer solution Take 3 mLs (2.5 mg total) by nebulization every 4 (four) hours as needed for wheezing or shortness of breath. 1 vial via nebulizer every 4-6 hours as needed.   ammonium lactate  (AMLACTIN) 12 % cream Apply 1 Application topically as needed for dry skin.   azelastine  (ASTELIN ) 0.1 % nasal spray Place 2 sprays into both nostrils 2 (two) times daily.   benztropine  (COGENTIN ) 1 MG tablet Take 1 tablet (1 mg total) by mouth 2 (two) times daily.   budesonide  (PULMICORT ) 0.5 MG/2ML nebulizer solution USE 2 ML(0.5 MG) VIA NEBULIZER EVERY 4 HOURS AS NEEDED   carboxymethylcellulose (REFRESH PLUS) 0.5 % SOLN Place 1 drop into both eyes 4 (four) times daily as needed (for dry eyes).   cetirizine  (ZYRTEC ) 10 MG tablet Take 1 tablet (10 mg total) by mouth daily as needed for allergies (Can use an extra dose during flares).   cycloSPORINE  modified (NEORAL ) 100 MG capsule Take 100 mg by mouth 2 (two)  times daily.   divalproex  (DEPAKOTE  ER) 500 MG 24 hr tablet Take 2 tablets (1,000 mg total) by mouth at bedtime.   doxepin  (SINEQUAN ) 25 MG capsule Take 1 capsule (25 mg total) by mouth at bedtime.   famotidine  (PEPCID ) 40 MG tablet Take 1 tablet (40 mg total) by mouth in the morning and at bedtime.   fluticasone  (FLONASE ) 50 MCG/ACT nasal spray Place 1 spray into both nostrils in the morning and at bedtime.   fluticasone  (FLOVENT  HFA) 110 MCG/ACT inhaler Inhale 2 puffs into the lungs 2 (two) times daily. Uses - as needed for  asthma flares   formoterol  (PERFOROMIST ) 20 MCG/2ML nebulizer solution Take 2 mLs (20 mcg total) by nebulization 2 (two) times daily.   glucose blood (ACCU-CHEK GUIDE TEST) test strip Use as instructed   levothyroxine  (SYNTHROID ) 75 MCG tablet Take 1 tablet (75 mcg total) by mouth daily before breakfast.   metoprolol  succinate (TOPROL  XL) 25 MG 24 hr tablet Take 1 tablet (25 mg total) by mouth daily.   mometasone  (ELOCON ) 0.1 % cream Apply topically 2 (two) times daily as needed. Can use on skin one to two times daily if needed.   montelukast  (SINGULAIR ) 10 MG tablet Take 1 tablet (10 mg total) by mouth at bedtime.   Multiple Vitamin (MULTIVITAMIN) tablet Take 2 tablets by mouth daily. 9 am.   Nebulizers MISC 1 kit by Does not apply route as directed.   omeprazole  (PRILOSEC) 40 MG capsule Take 1 capsule (40 mg total) by mouth in the morning and at bedtime.   polyethylene glycol (MIRALAX  / GLYCOLAX ) packet Take 17 g by mouth 2 (two) times daily. In water or juice   prazosin  (MINIPRESS ) 2 MG capsule Take 1 capsule (2 mg total) by mouth at bedtime.   RA VITAMIN D -3 50 MCG (2000 UT) CAPS Take 1 capsule (2,000 Units total) by mouth daily.   Respiratory Therapy Supplies (NEBULIZER/TUBING/MOUTHPIECE) KIT 2 each by Does not apply route as directed.   risperiDONE  (RISPERDAL  M-TABS) 3 MG disintegrating tablet Take 0.5-1 tablets (1.5-3 mg total) by mouth See admin instructions. Take 1.5  mg in the morning and 3 mg at night   rosuvastatin  (CRESTOR ) 10 MG tablet Take 1 tablet (10 mg total) by mouth daily.   Spacer/Aero-Hold Chamber Mask MISC Use as directed with inhaler.   Spacer/Aero-Hold Chamber Mask MISC 1 Units by Does not apply route as needed.   Tiotropium Bromide Monohydrate  (SPIRIVA  RESPIMAT) 1.25 MCG/ACT AERS Inhale 2 puffs into the lungs in the morning.   ursodiol  (ACTIGALL ) 500 MG tablet Take 1 tablet (500 mg total) by mouth 3 (three) times daily.   VENTOLIN  HFA 108 (90 Base) MCG/ACT inhaler Inhale 2 puffs into the lungs every 4 (four) hours as needed for wheezing or shortness of breath.   Wheat Dextrin (BENEFIBER DRINK MIX PO) Take 1 Scoop by mouth 3 (three) times a week.    Allergies:   Tramadol hcl, Aspirin, Ibuprofen, Penicillins, Pseudoephedrine, Sulfa antibiotics, Acetaminophen , Guaifenesin , Latex, and Rosemary oil   Social History   Socioeconomic History   Marital status: Single    Spouse name: Not on file   Number of children: 0   Years of education: 12 th   Highest education level: Not on file  Occupational History    Comment: Disabled  Tobacco Use   Smoking status: Never    Passive exposure: Never   Smokeless tobacco: Never  Vaping Use   Vaping status: Never Used  Substance and Sexual Activity   Alcohol  use: No    Alcohol /week: 0.0 standard drinks of alcohol    Drug use: No   Sexual activity: Never  Other Topics Concern   Not on file  Social History Narrative   Lives alone but uncle and his wife live next door and are her primary caregivers. Christopher (uncle) can be reached at [phone].    Education High school education.   Disabled.   Right handed.   Caffeine tea and coffee daily.         Current Social History   (Please include date ( . td) when updating information )  Who lives at home: lives  alone 07/05/2016    Transportation: uses Medicaid transportation to and from appointments 07/05/2016   Important Relationships & Pets: Higinio Harder lives next door 07/05/2016    Current Stressors: Person Care aid will stop this month 07/05/2016   Work / Education:  Receives disability 07/05/2016   Religious / Personal Beliefs: has strong religious beliefs 07/05/2016   Interests / Fun: Enjoys going to bible study  07/05/2016   Other: enjoys reading her bible 07/05/2016                                                                                                         Social Drivers of Health   Tobacco Use: Low Risk (05/27/2024)   Patient History    Smoking Tobacco Use: Never    Smokeless Tobacco Use: Never    Passive Exposure: Never  Financial Resource Strain: Not on file  Food Insecurity: No Food Insecurity (08/12/2023)   Hunger Vital Sign    Worried About Running Out of Food in the Last Year: Never true    Ran Out of Food in the Last Year: Never true  Transportation Needs: No Transportation Needs (08/12/2023)   PRAPARE - Administrator, Civil Service (Medical): No    Lack of Transportation (Non-Medical): No  Physical Activity: Not on file  Stress: Not on file  Social Connections: Not on file  Depression (PHQ2-9): Low Risk (10/23/2023)   Depression (PHQ2-9)    PHQ-2 Score: 1  Alcohol  Screen: Not on file  Housing: Unknown (10/28/2023)   Received from [facility]   Epic    Unable to Pay for Housing in the Last Year: Not on file    Number of Times Moved in the Last Year: Not on file    At any time in the past 12 months, were you homeless or living in a shelter (including now)?: No  Utilities: Not At Risk (08/12/2023)   [facility] Utilities    Threatened with loss of utilities: No  Health Literacy: Not on file     Family History:  The patient's family history includes Breast cancer in her cousin; COPD in her mother; Diabetes in her mother; Heart disease in her mother.   ROS:   Please see the history of present illness.    ROS All other systems reviewed and are negative.     12/04/2016    1:33 PM   PAD Screen  Previous PAD dx? No  Previous surgical procedure? Yes  Dates of procedures liver transplant - 2015  Pain with walking? No  Feet/toe relief with dangling? Yes  Painful, non-healing ulcers? No  Extremities discolored? Yes       PHYSICAL EXAM:   VS:  BP 130/72   Pulse 100   Ht 5' 4 (1.626 m)   Wt 243 lb 12.8 oz (110.6 kg)   LMP 12/02/2022   SpO2 97%   BMI 41.85 kg/m    GEN: Well nourished, well developed in no acute distress HEENT: Normal NECK: No JVD; No  carotid bruits LYMPHATICS: No lymphadenopathy CARDIAC:RRR, no murmurs, rubs, gallops RESPIRATORY:  Clear to auscultation without rales, wheezing or rhonchi  ABDOMEN: Soft, non-tender, non-distended MUSCULOSKELETAL:  No edema; No deformity  SKIN: Warm and dry NEUROLOGIC:  Alert and oriented x 3 PSYCHIATRIC:  Normal affect  Wt Readings from Last 3 Encounters:  05/27/24 243 lb 12.8 oz (110.6 kg)  05/19/24 243 lb 6.4 oz (110.4 kg)  05/11/24 243 lb (110.2 kg)      Studies/Labs Reviewed:    EKG Interpretation Date/Time:  Wednesday May 27 2024 10:13:57 EST Ventricular Rate:  90 PR Interval:  142 QRS Duration:  72 QT Interval:  378 QTC Calculation: 462 R Axis:   0  Text Interpretation: Normal sinus rhythm Normal ECG When compared with ECG of 20-Nov-2023 13:55, No significant change was found Confirmed by Shlomo Corning ([id number]) on 05/27/2024 10:24:28 AM    Cardiac Studies & Procedures   ______________________________________________________________________________________________   STRESS TESTS  MYOCARDIAL PERFUSION IMAGING 12/18/2016  Interpretation Summary  Nuclear stress EF: 74%.  No T wave inversion was noted during stress.  There was no ST segment deviation noted during stress.  This is a low risk study.  Low risk stress nuclear study with diaphragmatic attenuation artifact, otherwise normal perfusion and normal left ventricular regional and global systolic function.    ECHOCARDIOGRAM  ECHOCARDIOGRAM COMPLETE 01/06/2024  Narrative ECHOCARDIOGRAM REPORT    Patient Name:   Aireanna Luellen Date of Exam: 01/06/2024 Medical Rec #:  [id number]         Height:       59.0 in Accession #:    [id number]        Weight:       233.6 lb Date of Birth:  05/30/1975        BSA:          1.970 m Patient Age:    47 years          BP:           136/90 mmHg Patient Gender: F                 HR:           85 bpm. Exam Location:  [address]  Procedure: 2D Echo, 3D Echo, Cardiac Doppler and Color Doppler (Both Spectral and Color Flow Doppler were utilized during procedure).  Indications:    R07.9 Chest Pain  History:        Patient has prior history of Echocardiogram examinations, most recent 01/10/2012. CAD, Signs/Symptoms:Chest Pain; Risk Factors:Sleep Apnea and Family History of Coronary Artery Disease. Obesity, Sickle Cell Trait, End Stage Liver Disease, Status post Liver Transplant (2014), Asthma, Intellectual Delay.  Sonographer:    Velna Parkinson Referring Phys: CORNING SAUNDERS Adisa Litt  IMPRESSIONS   1. Left ventricular ejection fraction, by estimation, is 50 to 55%. Left ventricular ejection fraction by 3D volume is 52 %. The left ventricle has low normal function. The left ventricle has no regional wall motion abnormalities. Left ventricular diastolic parameters are consistent with Grade I diastolic dysfunction (impaired relaxation). 2. Right ventricular systolic function is normal. The right ventricular size is normal. There is normal pulmonary artery systolic pressure. The estimated right ventricular systolic pressure is 22.0 mmHg. 3. Left atrial size was mildly dilated. 4. The mitral valve is normal in structure. Trivial mitral valve regurgitation. No evidence of mitral stenosis. 5. The aortic valve is tricuspid. Aortic valve regurgitation is mild. No aortic stenosis is present. 6. The inferior vena  cava is normal in size with greater than 50% respiratory  variability, suggesting right atrial pressure of 3 mmHg.  FINDINGS Left Ventricle: Left ventricular ejection fraction, by estimation, is 50 to 55%. Left ventricular ejection fraction by 3D volume is 52 %. The left ventricle has low normal function. The left ventricle has no regional wall motion abnormalities. The left ventricular internal cavity size was normal in size. There is no left ventricular hypertrophy. Left ventricular diastolic parameters are consistent with Grade I diastolic dysfunction (impaired relaxation).  Right Ventricle: The right ventricular size is normal. No increase in right ventricular wall thickness. Right ventricular systolic function is normal. There is normal pulmonary artery systolic pressure. The tricuspid regurgitant velocity is 2.18 m/s, and with an assumed right atrial pressure of 3 mmHg, the estimated right ventricular systolic pressure is 22.0 mmHg.  Left Atrium: Left atrial size was mildly dilated.  Right Atrium: Right atrial size was normal in size.  Pericardium: There is no evidence of pericardial effusion.  Mitral Valve: The mitral valve is normal in structure. Trivial mitral valve regurgitation. No evidence of mitral valve stenosis.  Tricuspid Valve: The tricuspid valve is normal in structure. Tricuspid valve regurgitation is trivial.  Aortic Valve: The aortic valve is tricuspid. Aortic valve regurgitation is mild. Aortic regurgitation PHT measures 317 msec. No aortic stenosis is present.  Pulmonic Valve: The pulmonic valve was normal in structure. Pulmonic valve regurgitation is trivial.  Aorta: The aortic root is normal in size and structure.  Venous: The inferior vena cava is normal in size with greater than 50% respiratory variability, suggesting right atrial pressure of 3 mmHg.  IAS/Shunts: No atrial level shunt detected by color flow Doppler.  Additional Comments: 3D was performed not requiring image post processing on an independent  workstation and was abnormal.   LEFT VENTRICLE PLAX 2D LVIDd:         4.10 cm         Diastology LVIDs:         2.90 cm         LV e' medial:    8.32 cm/s LV PW:         0.75 cm         LV E/e' medial:  7.5 LV IVS:        0.75 cm         LV e' lateral:   10.20 cm/s LVOT diam:     1.75 cm         LV E/e' lateral: 6.2 LV SV:         46 LV SV Index:   24 LVOT Area:     2.41 cm        3D Volume EF LV 3D EF:    Left ventricul ar ejection fraction by 3D volume is 52 %.  3D Volume EF: 3D EF:        52 % LV EDV:       128 ml LV ESV:       62 ml LV SV:        66 ml  RIGHT VENTRICLE RV Basal diam:  3.45 cm RV S prime:     10.80 cm/s TAPSE (M-mode): 2.2 cm RVSP:           22.0 mmHg  LEFT ATRIUM             Index        RIGHT ATRIUM  Index LA diam:        3.50 cm 1.78 cm/m   RA Pressure: 3.00 mmHg LA Vol (A2C):   76.9 ml 39.03 ml/m  RA Area:     12.50 cm LA Vol (A4C):   48.8 ml 24.77 ml/m  RA Volume:   31.90 ml  16.19 ml/m LA Biplane Vol: 62.2 ml 31.57 ml/m AORTIC VALVE LVOT Vmax:   97.50 cm/s LVOT Vmean:  65.950 cm/s LVOT VTI:    0.193 m AI PHT:      317 msec  AORTA Ao Root diam: 2.90 cm Ao Asc diam:  2.95 cm  MITRAL VALVE               TRICUSPID VALVE MV Area (PHT): cm         TR Peak grad:   19.0 mmHg MV Decel Time: 162 msec    TR Vmax:        218.00 cm/s MV E velocity: 62.75 cm/s  Estimated RAP:  3.00 mmHg MV A velocity: 79.15 cm/s  RVSP:           22.0 mmHg MV E/A ratio:  0.79 SHUNTS Systemic VTI:  0.19 m Systemic Diam: 1.75 cm  Dalton McleanMD Electronically signed by Ezra Kanner Signature Date/Time: 01/06/2024/7:44:42 PM    Final    MONITORS  LONG TERM MONITOR (3-14 DAYS) 10/18/2023  Narrative NSR with sinus brady (59/min) and sinus tachy (150/min), ave 88/min. 2 episodes of NS SVT (4 and 5 beats), rates 108-162/min. No VT, atrial fib or sustained SVT. No prolonged pauses. Rare PVC's and PAC's (<1%) Symptoms associated with  NSR. Patch Wear Time:  7 days and 0 hours (2025-05-11T11:32:20-0400 to 2025-05-18T11:36:25-0400)   CT SCANS  CT CORONARY MORPH W/CTA COR W/SCORE 12/19/2023  Addendum 12/20/2023  2:14 PM ADDENDUM REPORT: 12/20/2023 14:12  EXAM: OVER-READ INTERPRETATION  CT CHEST  The following report is an over-read performed by radiologist Dr. Andrea Gasman of [facility] on 12/20/2023. This over-read does not include interpretation of cardiac or coronary anatomy or pathology. The coronary CTA interpretation by the cardiologist is attached.  COMPARISON:  Same date chest CT performed separately  FINDINGS: Vascular: Aortic atherosclerosis. The included aorta is normal in caliber.  Mediastinum/nodes: No adenopathy or mass. Unremarkable esophagus.  Lungs: No focal airspace disease. Tiny perifissural lymph node abutting the right minor fissure needs no further imaging follow-up. No pleural fluid. The included airways are patent.  Upper abdomen: No acute or unexpected findings.  Musculoskeletal: There are no acute or suspicious osseous abnormalities.  IMPRESSION: Aortic Atherosclerosis (ICD10-I70.0).   Electronically Signed By: Andrea Gasman M.D. On: 12/20/2023 14:12  Narrative CLINICAL DATA:  Chest pain  EXAM: Cardiac CTA  MEDICATIONS: Sub lingual nitro. 4mg  and lopressor  100mg   TECHNIQUE: A non-contrast, gated CT scan was obtained with axial slices of 2.5 mm through the heart for calcium  scoring. Calcium  scoring was performed using the Agatston method. A 120 kV prospective, gated, contrast cardiac CT scan was obtained. Gantry rotation speed was 230 msec and collimation was 0.63 mm. Two sublingual nitroglycerin  tablets (0.8 mg) were given. The 3D data set was reconstructed with motion correction for the best systolic or diastolic phase. Images were analyzed on a dedicated workstation using MPR, MIP, and VRT modes. The patient received 95 cc of  contrast.  FINDINGS: Non-cardiac: See separate report from [facility]. No significant findings on limited lung and soft tissue windows.  Calcium  Score: No calcium  noted  Coronary Arteries: Right dominant with  no anomalies  LM: Normal  LAD: 1-24% soft plaque mid/distal vessel  D1: Normal  D2: Normal  Circumflex: 1-24% soft plaque proximal /mid vessel  OM1: Normal  OM2: Normal  RCA: Normal  PDA: Normal  PLA: Normal  IMPRESSION: 1. CAD RADS 1 non obstructive CAD  2.  Calcium  score 0  3.  Normal ascending thoracic aorta 2.9 cm  4. Total soft plaque volume 39 mm3 which is 70 th patient percentile located in LAD/LCX  [employer]  Electronically Signed: By: Maude Emmer M.D. On: 12/19/2023 16:54     ______________________________________________________________________________________________      Recent Labs: 07/22/2023: B Natriuretic Peptide 68.9 07/23/2023: Magnesium  2.5; TSH 0.626 10/01/2023: ALT 15 04/09/2024: BUN 28; Creatinine, Ser 1.31; Hemoglobin 10.5; Platelets 152; Potassium 4.9; Sodium 140   Lipid Panel    Component Value Date/Time   CHOL 226 (H) 11/11/2023 0921   TRIG 282 (H) 11/11/2023 0921   HDL 44 11/11/2023 0921   CHOLHDL 5.1 (H) 11/11/2023 0921   CHOLHDL 6.0 (H) 04/28/2015 0928   VLDL 47 (H) 04/28/2015 0928   LDLCALC 132 (H) 11/11/2023 0921   LDLDIRECT 64 07/10/2011 1115     Additional studies/ records that were reviewed today include:  ER records, ER EKG and labs in May 2025    ASSESSMENT:    1. Palpitations   2. Paroxysmal SVT (supraventricular tachycardia)   3. Coronary artery disease involving native coronary artery of native heart without angina pectoris   4. Nonrheumatic aortic valve insufficiency       PLAN:  In order of problems listed above:  Palpitations/PSVT - EKG in the ER was normal - 2-week Zio patch to assess for arrhythmias showed several episodes of 4-5 beats runs of SVT - palpitations  improved on BB - continue Toprol  XL 25mg  daily with PRN refills  Chest pain ASCAD - CP Somewhat atypical ? if related to her sickle cell disease>>CP has resolved - coronary CTA 11/2023 with cor cal score 0 and <25% soft plaque in distal LAD  - 2D echo 12/2023 with low normal LVF and no pericardial effusion  HLD -LDL goal < 70 -continue Crestor  10mg  daily -I have personally reviewed and interpreted outside labs performed by patient's PCP which showed LDL 132 and HDL 43 on 11/11/2023 -Repeat FLP and ALT  Aortic Insufficiency -mild by echo 12/2023   Time Spent: 20 minutes total time of encounter, including 15 minutes spent in face-to-face patient care on the date of this encounter. This time includes coordination of care and counseling regarding above mentioned problem list. Remainder of non-face-to-face time involved reviewing chart documents/testing relevant to the patient encounter and documentation in the medical record. I have independently reviewed documentation from referring provider   Medication Adjustments/Labs and Tests Ordered: Current medicines are reviewed at length with the patient today.  Concerns regarding medicines are outlined above.  Medication changes, Labs and Tests ordered today are listed in the Patient Instructions below.  There are no Patient Instructions on file for this visit.  1 year followup with me  Signed, Wilbert Bihari, MD  05/27/2024 10:04 AM    [facility] [address] [place], [place], [place]  [address] Phone: [phone]; Fax: [phone]   "

## 2024-05-27 ENCOUNTER — Ambulatory Visit: Attending: Internal Medicine | Admitting: Cardiology

## 2024-05-27 ENCOUNTER — Encounter: Payer: Self-pay | Admitting: Cardiology

## 2024-05-27 VITALS — BP 130/72 | HR 100 | Ht 64.0 in | Wt 243.8 lb

## 2024-05-27 DIAGNOSIS — I4719 Other supraventricular tachycardia: Secondary | ICD-10-CM | POA: Diagnosis not present

## 2024-05-27 DIAGNOSIS — R002 Palpitations: Secondary | ICD-10-CM | POA: Diagnosis not present

## 2024-05-27 DIAGNOSIS — I471 Supraventricular tachycardia, unspecified: Secondary | ICD-10-CM | POA: Diagnosis not present

## 2024-05-27 DIAGNOSIS — I2510 Atherosclerotic heart disease of native coronary artery without angina pectoris: Secondary | ICD-10-CM | POA: Diagnosis not present

## 2024-05-27 DIAGNOSIS — I700 Atherosclerosis of aorta: Secondary | ICD-10-CM | POA: Diagnosis not present

## 2024-05-27 DIAGNOSIS — Z79899 Other long term (current) drug therapy: Secondary | ICD-10-CM | POA: Insufficient documentation

## 2024-05-27 DIAGNOSIS — I351 Nonrheumatic aortic (valve) insufficiency: Secondary | ICD-10-CM | POA: Diagnosis not present

## 2024-05-27 LAB — ALT: ALT: 20 IU/L (ref 0–32)

## 2024-05-27 LAB — LIPID PANEL
Chol/HDL Ratio: 2.9 ratio (ref 0.0–4.4)
Cholesterol, Total: 172 mg/dL (ref 100–199)
HDL: 59 mg/dL
LDL Chol Calc (NIH): 84 mg/dL (ref 0–99)
Triglycerides: 168 mg/dL — ABNORMAL HIGH (ref 0–149)
VLDL Cholesterol Cal: 29 mg/dL (ref 5–40)

## 2024-05-27 NOTE — Patient Instructions (Signed)
 Medication Instructions:  Your physician recommends that you continue on your current medications as directed. Please refer to the Current Medication list given to you today.  *If you need a refill on your cardiac medications before your next appointment, please call your pharmacy*  Lab Work: Please complete a FASTING lipid panel and an ALT in our first floor lab before you leave today.  If you have labs (blood work) drawn today and your tests are completely normal, you will receive your results only by: MyChart Message (if you have MyChart) OR A paper copy in the mail If you have any lab test that is abnormal or we need to change your treatment, we will call you to review the results.  Testing/Procedures: None.  Follow-Up: At [facility], you and your health needs are our priority.  As part of our continuing mission to provide you with exceptional heart care, our providers are all part of one team.  This team includes your primary Cardiologist (physician) and Advanced Practice Providers or APPs (Physician Assistants and Nurse Practitioners) who all work together to provide you with the care you need, when you need it.  Your next appointment:   1 year(s)  Provider:   Dr. Wilbert Bihari, MD

## 2024-05-29 ENCOUNTER — Ambulatory Visit: Payer: Self-pay | Admitting: Cardiology

## 2024-06-01 ENCOUNTER — Encounter: Payer: Self-pay | Admitting: Pharmacist

## 2024-06-01 ENCOUNTER — Ambulatory Visit (INDEPENDENT_AMBULATORY_CARE_PROVIDER_SITE_OTHER): Admitting: Pharmacist

## 2024-06-01 ENCOUNTER — Ambulatory Visit: Admitting: Family Medicine

## 2024-06-01 VITALS — BP 129/89 | HR 112 | Wt 242.0 lb

## 2024-06-01 DIAGNOSIS — F79 Unspecified intellectual disabilities: Secondary | ICD-10-CM | POA: Diagnosis not present

## 2024-06-01 NOTE — Assessment & Plan Note (Signed)
 Polypharmacy - Medication Reconciliation in patient with intellectual disability and history of transplant - Medication list reviewed and updated.   Understanding of regimen: fair-good Understanding of indications: fair.  Potential of compliance: good with use of pill box - taking 4 different times per day.    The following issues were noted: Full with all oral medications for 3 entire weeks. Refills of medications need for next fill include: -Benztropine1mg  -Cyclosporine  100 mg

## 2024-06-01 NOTE — Patient Instructions (Signed)
 Great to see you again - GREAT to hear the new pill boxes are working well for you.    Medication Changes: None   Refills of medications need for next fill include: -Benztropine  -Cyclosporine 

## 2024-06-01 NOTE — Progress Notes (Signed)
 Reviewed and agree with Dr Rennis plan.

## 2024-06-01 NOTE — Progress Notes (Signed)
" ° ° °  S:     Chief Complaint  Patient presents with   Medication Management    Medication Box Fill - Adherence   49 y.o. who presents for medication management due to concern of polypharmacy and need for medication adherence monitoring and assistance Patient arrives in good spirits and presents with assistance of rolling walker.     PMH is significant for liver transplant 06/2013, schizophrenia, Asthma, CAD and intellectual disability.   Patient was referred and last seen by Primary Care Provider, Dr. Jerrie on 05/19/2024.    Insurance Coverage: Medicaid Healthy Blue  Do you know what each of your medicines is for? yes Do you feel that your medications are working for you? yes Have you been experiencing any side effects to the medications prescribed? no  O:  Review of Systems  All other systems reviewed and are negative.   Physical Exam Vitals reviewed.  Constitutional:      Appearance: Normal appearance.  Pulmonary:     Effort: Pulmonary effort is normal.  Neurological:     Mental Status: She is alert.  Psychiatric:        Mood and Affect: Mood normal.    Vitals:   06/01/24 1021  BP: 129/89  Pulse: (!) 112  SpO2: 98%     A/P: Polypharmacy - Medication Reconciliation in patient with intellectual disability and history of transplant - Medication list reviewed and updated.   Understanding of regimen: fair-good Understanding of indications: fair.  Potential of compliance: good with use of pill box - taking 4 different times per day.    The following issues were noted: Full with all oral medications for 3 entire weeks. Refills of medications need for next fill include: -Benztropine1mg  -Cyclosporine  100 mg   Written patient instructions and updated medication list provided. Total time in face to face counseling 48 minutes.   Follow up pharmacist visit 06/22/24 PCP Clinic Visit 07/13/24 Patient seen with Megan McGill, PharmD Candidate - [employer] student.     "

## 2024-06-02 ENCOUNTER — Ambulatory Visit: Admitting: Allergy and Immunology

## 2024-06-02 NOTE — Telephone Encounter (Signed)
-----   Message from Wilbert Bihari, MD sent at 05/29/2024  9:52 AM EST ----- LDL not at goal of < 70 so Increase Crestor  to 20mg  daily and repeat FLP and ALT In 6 weeks

## 2024-06-02 NOTE — Telephone Encounter (Signed)
 Call to patient to discuss results. No answer, no VM available. [facility] sent.

## 2024-06-08 NOTE — Telephone Encounter (Signed)
 Call to patient regarding lab results, no answer. No VM. [facility] sent.

## 2024-06-08 NOTE — Telephone Encounter (Signed)
-----   Message from Wilbert Bihari, MD sent at 05/29/2024  9:52 AM EST ----- LDL not at goal of < 70 so Increase Crestor  to 20mg  daily and repeat FLP and ALT In 6 weeks

## 2024-06-15 ENCOUNTER — Telehealth: Payer: Self-pay | Admitting: Cardiology

## 2024-06-15 DIAGNOSIS — I2510 Atherosclerotic heart disease of native coronary artery without angina pectoris: Secondary | ICD-10-CM

## 2024-06-15 DIAGNOSIS — Z79899 Other long term (current) drug therapy: Secondary | ICD-10-CM

## 2024-06-15 DIAGNOSIS — I700 Atherosclerosis of aorta: Secondary | ICD-10-CM

## 2024-06-15 DIAGNOSIS — E785 Hyperlipidemia, unspecified: Secondary | ICD-10-CM

## 2024-06-15 NOTE — Telephone Encounter (Signed)
 Patient is returning call to discuss labs.

## 2024-06-15 NOTE — Telephone Encounter (Signed)
 Kept ringing. As unable to leave voice mail.

## 2024-06-16 ENCOUNTER — Other Ambulatory Visit: Payer: Self-pay | Admitting: Allergy and Immunology

## 2024-06-18 ENCOUNTER — Ambulatory Visit: Admitting: Pharmacist

## 2024-06-18 ENCOUNTER — Ambulatory Visit: Admitting: Podiatry

## 2024-06-18 ENCOUNTER — Encounter: Payer: Self-pay | Admitting: Pharmacist

## 2024-06-18 VITALS — BP 121/78 | HR 109 | Wt 244.8 lb

## 2024-06-18 DIAGNOSIS — Z789 Other specified health status: Secondary | ICD-10-CM | POA: Diagnosis not present

## 2024-06-18 MED ORDER — ACCU-CHEK GUIDE W/DEVICE KIT: 1.0000 | PACK | Freq: Once | 0 refills | Status: AC

## 2024-06-18 NOTE — Assessment & Plan Note (Signed)
 Polypharmacy - Medication Reconciliation in patient with intellectual disability and history of transplant - Medication list reviewed and updated.   Understanding of regimen: fair-good Understanding of indications: fair.  Potential of compliance: good with use of pill box - taking 4 different times per day.    The following issues were noted: Full with all oral medications for 3 entire weeks. Refills of medications need for next fill include: -Prazosin  -Omeprazole  -Ursodiol  -Risperidone 

## 2024-06-18 NOTE — Patient Instructions (Signed)
 Great to see you again - GREAT to hear the new pill boxes are working well for you.    Medication Changes: None   Refills of medications need for next fill include: -multivitamin - vitamin D   -Prazosin  -Omeprazole  -Ursodiol  -Risperidone 

## 2024-06-18 NOTE — Progress Notes (Signed)
" ° ° °  S:     Chief Complaint  Patient presents with   Medication Management    Pill Box Fill    48 y.o. who presents for medication management due to concern of polypharmacy and need for medication adherence monitoring and assistance Patient arrives in good spirits and presents with assistance of rolling walker.     PMH is significant for liver transplant 06/2013, schizophrenia, Asthma, CAD and intellectual disability.   Patient was referred and last seen by Primary Care Provider, Dr. Jerrie on 05/19/2024.    Insurance Coverage: Medicaid Healthy Blue   Do you know what each of your medicines is for? Yes ~ 80%  Do you feel that your medications are working for you? yes Have you been experiencing any side effects to the medications prescribed? no  States glucometer is not working and reports not checking glucose since 03/2024   O:  Review of Systems  All other systems reviewed and are negative.   Physical Exam Vitals reviewed.  Constitutional:      Appearance: Normal appearance.  Pulmonary:     Effort: Pulmonary effort is normal.  Neurological:     Mental Status: She is alert.  Psychiatric:        Mood and Affect: Mood normal.      Vitals:   06/18/24 1432  BP: 121/78  Pulse: (!) 109  SpO2: 99%     A/P: Polypharmacy - Medication Reconciliation in patient with intellectual disability and history of transplant - Medication list reviewed and updated.   Understanding of regimen: fair-good Understanding of indications: fair.  Potential of compliance: good with use of pill box - taking 4 different times per day.    The following issues were noted: Full with all oral medications for 3 entire weeks. Refills of medications need for next fill include: -Prazosin  -Omeprazole  -Ursodiol  -Risperidone   -multivitamin - OTC -vitamin D  - OTC   Written patient instructions and updated medication list provided. Total time in face to face counseling 38 minutes.    Follow up  pharmacist visit 07/09/2024 PCP Clinic Visit 07/13/2024 Patient seen with Sabra Schuller, PharmD Candidate - [employer] student and Megan McGill, PharmD Candidate - [employer] student.     "

## 2024-06-19 NOTE — Progress Notes (Signed)
 Reviewed and agree with Dr Rennis plan.

## 2024-06-22 ENCOUNTER — Ambulatory Visit: Admitting: Pharmacist

## 2024-06-24 NOTE — Telephone Encounter (Signed)
 Pt returning a call. And would like a c/b. Please advise

## 2024-06-25 ENCOUNTER — Ambulatory Visit: Admitting: Pharmacist

## 2024-06-25 MED ORDER — ROSUVASTATIN CALCIUM 20 MG PO TABS: 20.0000 mg | ORAL_TABLET | Freq: Every day | ORAL | 3 refills | Status: AC

## 2024-06-25 NOTE — Telephone Encounter (Signed)
 Call to patient, advised LDL not at goal of < 70 . Patient agrees to Increase Crestor  to 20mg  daily and repeat FLP and ALT In 6 weeks.

## 2024-06-25 NOTE — Addendum Note (Signed)
 Addended by: JANIT GENI CROME on: 06/25/2024 02:53 PM   Modules accepted: Orders

## 2024-06-30 ENCOUNTER — Telehealth: Payer: Self-pay

## 2024-06-30 ENCOUNTER — Ambulatory Visit: Admitting: Allergy and Immunology

## 2024-07-01 ENCOUNTER — Ambulatory Visit: Admitting: Podiatry

## 2024-07-09 ENCOUNTER — Ambulatory Visit: Admitting: Pharmacist

## 2024-07-13 ENCOUNTER — Ambulatory Visit: Payer: Self-pay

## 2024-07-27 ENCOUNTER — Ambulatory Visit: Payer: Self-pay | Admitting: Obstetrics and Gynecology

## 2024-08-19 ENCOUNTER — Ambulatory Visit (INDEPENDENT_AMBULATORY_CARE_PROVIDER_SITE_OTHER): Admitting: Otolaryngology

## 2024-08-27 ENCOUNTER — Ambulatory Visit: Admitting: Neurology

## 2024-09-08 ENCOUNTER — Ambulatory Visit: Admitting: Allergy and Immunology
